# Patient Record
Sex: Female | Born: 1940
Health system: Southern US, Community
[De-identification: ages and names within clinical notes are randomized; demographics above are authoritative.]

## PROBLEM LIST (undated history)

## (undated) DIAGNOSIS — I441 Atrioventricular block, second degree: Secondary | ICD-10-CM

## (undated) DIAGNOSIS — I484 Atypical atrial flutter: Secondary | ICD-10-CM

## (undated) DIAGNOSIS — I10 Essential (primary) hypertension: Secondary | ICD-10-CM

## (undated) DIAGNOSIS — Z953 Presence of xenogenic heart valve: Secondary | ICD-10-CM

## (undated) DIAGNOSIS — Z9889 Other specified postprocedural states: Secondary | ICD-10-CM

## (undated) DIAGNOSIS — S065XAA Traumatic subdural hemorrhage with loss of consciousness status unknown, initial encounter: Secondary | ICD-10-CM

## (undated) DIAGNOSIS — Z889 Allergy status to unspecified drugs, medicaments and biological substances status: Secondary | ICD-10-CM

## (undated) DIAGNOSIS — I4891 Unspecified atrial fibrillation: Secondary | ICD-10-CM

## (undated) DIAGNOSIS — S069XAA Unspecified intracranial injury with loss of consciousness status unknown, initial encounter: Secondary | ICD-10-CM

## (undated) DIAGNOSIS — Z8679 Personal history of other diseases of the circulatory system: Secondary | ICD-10-CM

## (undated) DIAGNOSIS — M8580 Other specified disorders of bone density and structure, unspecified site: Secondary | ICD-10-CM

## (undated) DIAGNOSIS — S069X9A Unspecified intracranial injury with loss of consciousness of unspecified duration, initial encounter: Secondary | ICD-10-CM

## (undated) DIAGNOSIS — S065X9A Traumatic subdural hemorrhage with loss of consciousness of unspecified duration, initial encounter: Secondary | ICD-10-CM

## (undated) DIAGNOSIS — E039 Hypothyroidism, unspecified: Secondary | ICD-10-CM

## (undated) DIAGNOSIS — I77810 Thoracic aortic ectasia: Secondary | ICD-10-CM

## (undated) DIAGNOSIS — I052 Rheumatic mitral stenosis with insufficiency: Secondary | ICD-10-CM

## (undated) DIAGNOSIS — I071 Rheumatic tricuspid insufficiency: Secondary | ICD-10-CM

## (undated) DIAGNOSIS — I4819 Other persistent atrial fibrillation: Secondary | ICD-10-CM

## (undated) DIAGNOSIS — I700 Atherosclerosis of aorta: Secondary | ICD-10-CM

## (undated) DIAGNOSIS — I091 Rheumatic diseases of endocardium, valve unspecified: Secondary | ICD-10-CM

## (undated) DIAGNOSIS — K635 Polyp of colon: Secondary | ICD-10-CM

## (undated) DIAGNOSIS — I351 Nonrheumatic aortic (valve) insufficiency: Secondary | ICD-10-CM

## (undated) DIAGNOSIS — I2510 Atherosclerotic heart disease of native coronary artery without angina pectoris: Secondary | ICD-10-CM

## (undated) HISTORY — DX: Atherosclerosis of aorta: I70.0

## (undated) HISTORY — DX: Atypical atrial flutter: I48.4

## (undated) HISTORY — DX: Essential (primary) hypertension: I10

## (undated) HISTORY — DX: Traumatic subdural hemorrhage with loss of consciousness of unspecified duration, initial encounter: S06.5X9A

## (undated) HISTORY — DX: Atrioventricular block, second degree: I44.1

## (undated) HISTORY — DX: Unspecified intracranial injury with loss of consciousness of unspecified duration, initial encounter: S06.9X9A

## (undated) HISTORY — DX: Other specified disorders of bone density and structure, unspecified site: M85.80

## (undated) HISTORY — PX: TUBAL LIGATION: SHX77

## (undated) HISTORY — DX: Unspecified atrial fibrillation: I48.91

## (undated) HISTORY — DX: Rheumatic diseases of endocardium, valve unspecified: I09.1

## (undated) HISTORY — DX: Traumatic subdural hemorrhage with loss of consciousness status unknown, initial encounter: S06.5XAA

## (undated) HISTORY — PX: APPENDECTOMY: SHX54

## (undated) HISTORY — DX: Unspecified intracranial injury with loss of consciousness status unknown, initial encounter: S06.9XAA

## (undated) HISTORY — DX: Atherosclerotic heart disease of native coronary artery without angina pectoris: I25.10

---
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
## 1998-03-18 ENCOUNTER — Other Ambulatory Visit: Admission: RE | Admit: 1998-03-18 | Discharge: 1998-03-18 | Payer: Self-pay | Admitting: *Deleted

## 1999-03-16 ENCOUNTER — Other Ambulatory Visit: Admission: RE | Admit: 1999-03-16 | Discharge: 1999-03-16 | Payer: Self-pay | Admitting: *Deleted

## 1999-05-16 ENCOUNTER — Encounter: Payer: Self-pay | Admitting: *Deleted

## 1999-05-16 ENCOUNTER — Encounter: Admission: RE | Admit: 1999-05-16 | Discharge: 1999-05-16 | Payer: Self-pay | Admitting: *Deleted

## 2000-03-29 ENCOUNTER — Other Ambulatory Visit: Admission: RE | Admit: 2000-03-29 | Discharge: 2000-03-29 | Payer: Self-pay | Admitting: *Deleted

## 2000-05-21 ENCOUNTER — Encounter: Admission: RE | Admit: 2000-05-21 | Discharge: 2000-05-21 | Payer: Self-pay | Admitting: *Deleted

## 2000-05-21 ENCOUNTER — Encounter: Payer: Self-pay | Admitting: *Deleted

## 2001-04-16 ENCOUNTER — Other Ambulatory Visit: Admission: RE | Admit: 2001-04-16 | Discharge: 2001-04-16 | Payer: Self-pay | Admitting: *Deleted

## 2001-06-03 ENCOUNTER — Encounter: Admission: RE | Admit: 2001-06-03 | Discharge: 2001-06-03 | Payer: Self-pay | Admitting: *Deleted

## 2001-06-03 ENCOUNTER — Encounter: Payer: Self-pay | Admitting: *Deleted

## 2004-04-10 DIAGNOSIS — M8580 Other specified disorders of bone density and structure, unspecified site: Secondary | ICD-10-CM

## 2004-04-10 HISTORY — DX: Other specified disorders of bone density and structure, unspecified site: M85.80

## 2005-02-17 ENCOUNTER — Ambulatory Visit (HOSPITAL_COMMUNITY): Admission: RE | Admit: 2005-02-17 | Discharge: 2005-02-17 | Payer: Self-pay | Admitting: Family Medicine

## 2005-07-04 ENCOUNTER — Encounter (INDEPENDENT_AMBULATORY_CARE_PROVIDER_SITE_OTHER): Payer: Self-pay | Admitting: Specialist

## 2005-07-04 ENCOUNTER — Ambulatory Visit: Payer: Self-pay | Admitting: Internal Medicine

## 2005-07-04 ENCOUNTER — Ambulatory Visit (HOSPITAL_COMMUNITY): Admission: RE | Admit: 2005-07-04 | Discharge: 2005-07-04 | Payer: Self-pay | Admitting: Internal Medicine

## 2007-01-31 ENCOUNTER — Ambulatory Visit (HOSPITAL_COMMUNITY): Admission: RE | Admit: 2007-01-31 | Discharge: 2007-01-31 | Payer: Self-pay | Admitting: Family Medicine

## 2007-04-08 ENCOUNTER — Ambulatory Visit (HOSPITAL_COMMUNITY): Admission: RE | Admit: 2007-04-08 | Discharge: 2007-04-08 | Payer: Self-pay | Admitting: Family Medicine

## 2007-06-25 ENCOUNTER — Emergency Department (HOSPITAL_COMMUNITY): Admission: EM | Admit: 2007-06-25 | Discharge: 2007-06-25 | Payer: Self-pay | Admitting: Emergency Medicine

## 2008-02-04 ENCOUNTER — Ambulatory Visit (HOSPITAL_COMMUNITY): Admission: RE | Admit: 2008-02-04 | Discharge: 2008-02-04 | Payer: Self-pay | Admitting: Family Medicine

## 2009-04-27 ENCOUNTER — Ambulatory Visit (HOSPITAL_COMMUNITY): Admission: RE | Admit: 2009-04-27 | Discharge: 2009-04-27 | Payer: Self-pay | Admitting: Family Medicine

## 2010-01-31 ENCOUNTER — Ambulatory Visit (HOSPITAL_COMMUNITY): Admission: RE | Admit: 2010-01-31 | Discharge: 2010-01-31 | Payer: Self-pay | Admitting: Family Medicine

## 2010-05-01 ENCOUNTER — Encounter: Payer: Self-pay | Admitting: Family Medicine

## 2010-08-26 NOTE — Op Note (Signed)
NAME:  Hannah Roy, Hannah Roy                   ACCOUNT NO.:  [id number]   MEDICAL RECORD NO.:  [id number]          PATIENT TYPE:  AMB   LOCATION:  DAY                           FACILITY:  [facility]   PHYSICIAN:  Lionel December, M.D.    DATE OF BIRTH:  Nov 28, 1940   DATE OF PROCEDURE:  07/04/2005  DATE OF DISCHARGE:                                 OPERATIVE REPORT   PROCEDURE:  Colonoscopy with polypectomy.   INDICATIONS:  Peg is a 70 year old female who is undergoing screening  colonoscopy.  Family history is negative for colorectal carcinoma, although  brother died of some type of malignancy.  Procedure and risks were reviewed  with the patient and informed consent was obtained.   MEDICINES FOR CONSCIOUS SEDATION:  Demerol 50 mg IV, Versed 7 mg IV.   FINDINGS:  Procedure performed in endoscopy suite.  The patient's vital  signs and O2 saturations were monitored during procedure and remained  stable.  The patient was placed in the left lateral position and rectal  examination performed.  No abnormality noted on external or digital exam.  Olympus videoscope was placed in the rectum and advanced under vision into  sigmoid colon and beyond.  Preparation was excellent.  Scope was passed into  the cecum which was identified by ileocecal valve and appendiceal orifice.  Pictures taken for the record.  As the scope was withdrawn colonic mucosa  was carefully examined.  There was an 8-to-9-mm polyp which was bi-or-tri-  lobed at midsigmoid colon.  This was raised with submucosal injection of  saline and snared.  Polypectomy was complete.  This polyp was suctioned out  through the channel.  Mucosa and rest of the sigmoid colon and rectum was  normal.  Scope was retroflexed to examine anorectal junction and small  hemorrhoids were noted below the dentate line.  Endoscope was straightened  and withdrawn. The patient tolerated the procedure well.   FINAL DIAGNOSIS:  1.  An 8-to-9-mm polyp snared from the sigmoid  colon as described above.  2.  Small external hemorrhoids.   RECOMMENDATIONS:  1.  Stent instructions given.  2.  No aspirin for 1 week.  3.  Since this polyp appears to be edematous she will need to return for      followup exam in 5 years, but will wait for histology before further      recommendations are made.      Lionel December, M.D.  Electronically Signed     NR/MEDQ  D:  07/04/2005  T:  07/04/2005  Job:  [id number]   cc:   Lorin Picket A. Gerda Diss, MD  Fax: [phone]

## 2010-11-10 ENCOUNTER — Emergency Department (HOSPITAL_COMMUNITY): Payer: BC Managed Care – PPO

## 2010-11-10 ENCOUNTER — Encounter: Payer: Self-pay | Admitting: *Deleted

## 2010-11-10 ENCOUNTER — Emergency Department (HOSPITAL_COMMUNITY)
Admission: EM | Admit: 2010-11-10 | Discharge: 2010-11-10 | Disposition: A | Discharge status: Home / Self Care | Payer: BC Managed Care – PPO | Attending: Emergency Medicine | Admitting: Emergency Medicine

## 2010-11-10 ENCOUNTER — Other Ambulatory Visit: Payer: Self-pay

## 2010-11-10 DIAGNOSIS — H8110 Benign paroxysmal vertigo, unspecified ear: Secondary | ICD-10-CM | POA: Insufficient documentation

## 2010-11-10 DIAGNOSIS — I10 Essential (primary) hypertension: Secondary | ICD-10-CM | POA: Insufficient documentation

## 2010-11-10 HISTORY — DX: Essential (primary) hypertension: I10

## 2010-11-10 LAB — CBC
HCT: 38.3 % (ref 36.0–46.0)
Hemoglobin: 12.8 g/dL (ref 12.0–15.0)
MCH: 30.8 pg (ref 26.0–34.0)
MCHC: 33.4 g/dL (ref 30.0–36.0)
MCV: 92.3 fL (ref 78.0–100.0)
Platelets: 232 10*3/uL (ref 150–400)
RBC: 4.15 MIL/uL (ref 3.87–5.11)
RDW: 12.5 % (ref 11.5–15.5)
WBC: 5.6 10*3/uL (ref 4.0–10.5)

## 2010-11-10 LAB — BASIC METABOLIC PANEL
BUN: 21 mg/dL (ref 6–23)
CO2: 22 mEq/L (ref 19–32)
Calcium: 10.4 mg/dL (ref 8.4–10.5)
Chloride: 100 mEq/L (ref 96–112)
Creatinine, Ser: 0.6 mg/dL (ref 0.50–1.10)
GFR calc Af Amer: >60 mL/min (ref >60)
GFR calc non Af Amer: >60 mL/min (ref >60)
Glucose, Bld: 97 mg/dL (ref 70–99)
Potassium: 3.8 mEq/L (ref 3.5–5.1)
Sodium: 137 mEq/L (ref 135–145)

## 2010-11-10 LAB — DIFFERENTIAL
Basophils Absolute: 0 10*3/uL (ref 0.0–0.1)
Basophils Relative: 0 % (ref 0–1)
Eosinophils Absolute: 0.2 10*3/uL (ref 0.0–0.7)
Eosinophils Relative: 3 % (ref 0–5)
Lymphocytes Relative: 21 % (ref 12–46)
Lymphs Abs: 1.2 10*3/uL (ref 0.7–4.0)
Monocytes Absolute: 0.3 10*3/uL (ref 0.1–1.0)
Monocytes Relative: 5 % (ref 3–12)
Neutro Abs: 3.9 10*3/uL (ref 1.7–7.7)
Neutrophils Relative %: 70 % (ref 43–77)

## 2010-11-10 LAB — URINALYSIS, ROUTINE W REFLEX MICROSCOPIC
Bilirubin Urine: NEGATIVE
Glucose, UA: NEGATIVE mg/dL
Hgb urine dipstick: NEGATIVE
Ketones, ur: NEGATIVE mg/dL
Leukocytes, UA: NEGATIVE
Nitrite: NEGATIVE
Protein, ur: NEGATIVE mg/dL
Specific Gravity, Urine: 1.01 (ref 1.005–1.030)
Urobilinogen, UA: 0.2 mg/dL (ref 0.0–1.0)
pH: 5.5 (ref 5.0–8.0)

## 2010-11-10 MED ORDER — MECLIZINE HCL 25 MG PO TABS: 25.0000 mg | ORAL_TABLET | Freq: Three times a day (TID) | ORAL | Status: AC | PRN

## 2010-11-10 NOTE — ED Provider Notes (Signed)
History     CSN: [id number] Arrival date & time: 11/10/2010  7:07 PM  Chief Complaint  Patient presents with  . Dizziness   Patient is a 70 y.o. female presenting with extremity weakness. The history is provided by the patient.  Extremity Weakness This is a new problem. The current episode started today (She was just starting her shift at work,  stood up after sitting for about 10 minutes  and felt lightheaded and dizzy,  reporting blurred vision as well.  Her co workers helped her sit down, and her symptoms slowly improved over the next 15 minutes.  ). The problem has been rapidly improving. Associated symptoms include weakness. Pertinent negatives include no abdominal pain, arthralgias, chest pain, congestion, coughing, diaphoresis, fever, headaches, joint swelling, nausea, neck pain, numbness, rash or sore throat. Exacerbated by: movement. She has tried rest for the symptoms. The treatment provided moderate relief.    Past Medical History  Diagnosis Date  . Hypertension     History reviewed. No pertinent past surgical history.  No family history on file.  History  Substance Use Topics  . Smoking status: Never Smoker   . Smokeless tobacco: Not on file  . Alcohol Use: No    OB History    Grav Para Term Preterm Abortions TAB SAB Ect Mult Living                  Review of Systems  Constitutional: Negative for fever and diaphoresis.  HENT: Negative for ear pain, congestion, sore throat, neck pain, neck stiffness, sinus pressure and tinnitus.   Eyes: Positive for visual disturbance.  Respiratory: Negative for cough, chest tightness and shortness of breath.   Cardiovascular: Negative for chest pain and palpitations.  Gastrointestinal: Negative for nausea and abdominal pain.  Genitourinary: Negative.   Musculoskeletal: Positive for extremity weakness. Negative for joint swelling and arthralgias.  Skin: Negative.  Negative for rash and wound.  Neurological: Positive for  dizziness and weakness. Negative for syncope, speech difficulty, light-headedness, numbness and headaches.  Hematological: Negative.   Psychiatric/Behavioral: Negative.     Physical Exam  BP 148/90  Pulse 63  Temp(Src) 98.4 F (36.9 C) (Oral)  Resp 20  Ht 5\' 1"  (1.549 m)  Wt 130 lb (58.968 kg)  BMI 24.56 kg/m2  SpO2 96%  Physical Exam  Vitals reviewed. Constitutional: She is oriented to person, place, and time. She appears well-developed and well-nourished.  HENT:  Head: Normocephalic and atraumatic.  Mouth/Throat: Oropharynx is clear and moist.  Eyes: Conjunctivae and EOM are normal. Pupils are equal, round, and reactive to light.  Neck: Normal range of motion. Neck supple.  Cardiovascular: Normal rate, regular rhythm, normal heart sounds and intact distal pulses.   Pulmonary/Chest: Effort normal and breath sounds normal. She has no wheezes.  Abdominal: Soft. Bowel sounds are normal. There is no tenderness.  Musculoskeletal: Normal range of motion. She exhibits no edema and no tenderness.  Lymphadenopathy:    She has no cervical adenopathy.  Neurological: She is alert and oriented to person, place, and time. She has normal strength. No cranial nerve deficit or sensory deficit. She exhibits normal muscle tone. Coordination normal. GCS eye subscore is 4. GCS verbal subscore is 5. GCS motor subscore is 6.  Skin: Skin is warm and dry.  Psychiatric: She has a normal mood and affect.    ED Course  Procedures  MDM      Date: 11/10/2010  Rate: 63  Rhythm: normal sinus rhythm  QRS  Axis: normal  Intervals: normal  ST/T Wave abnormalities: normal  Conduction Disutrbances:none  Narrative Interpretation:   Old EKG Reviewed: unchanged    Candis Musa, PA 11/10/10 2107

## 2010-11-10 NOTE — ED Notes (Signed)
Pt reports she was at work tonight and suddenly became dizzy, pt reports increased dizziness and "spinning" sensation when moving head and changing positions

## 2010-11-10 NOTE — ED Notes (Signed)
Pt reports sudden onset of "world spinning" but no focal weakness Denies cp/sob/syncope Well appearing at this time, no focal neuro deficits  Medical screening examination/treatment/procedure(s) were conducted as a shared visit with non-physician practitioner(s) and myself.  I personally evaluated the patient during the encounter  Joya Gaskins, MD 11/10/10 2010

## 2010-11-10 NOTE — ED Notes (Signed)
Pt able to ambulate w/ no gait issues. Pt states she feel 100% better now than what she did before coming to the ed. Reports she still has a little dizziness.

## 2010-11-11 NOTE — ED Provider Notes (Signed)
Medical screening examination/treatment/procedure(s) were conducted as a shared visit with non-physician practitioner(s) and myself.  I personally evaluated the patient during the encounter  Pt improved, walking around room, no pastpointing, no focal weakness.  Suspicion for CVA is low Suspicion for pre-syncope is low.  She is well appearing and wants to go home  Joya Gaskins, MD 11/11/10 0000

## 2011-01-02 LAB — DIFFERENTIAL
Basophils Absolute: 0
Basophils Relative: 1
Eosinophils Absolute: 0.3
Eosinophils Relative: 5
Lymphocytes Relative: 36
Lymphs Abs: 1.7
Monocytes Absolute: 0.4
Monocytes Relative: 8
Neutro Abs: 2.3
Neutrophils Relative %: 49

## 2011-01-02 LAB — BASIC METABOLIC PANEL
BUN: 15
CO2: 27
Calcium: 9.1
Chloride: 107
Creatinine, Ser: 1.07
GFR calc Af Amer: >60
GFR calc non Af Amer: 52 — ABNORMAL LOW
Glucose, Bld: 103 — ABNORMAL HIGH
Potassium: 3.8
Sodium: 140

## 2011-01-02 LAB — CBC
HCT: 36.8
Hemoglobin: 12.9
MCHC: 35.1
MCV: 91.5
Platelets: 221
RBC: 4.02
RDW: 12.7
WBC: 4.8

## 2011-01-02 LAB — POCT CARDIAC MARKERS
CKMB, poc: 1.1
Myoglobin, poc: 41.2
Operator id: 216151
Troponin i, poc: <0.05

## 2011-01-10 ENCOUNTER — Other Ambulatory Visit: Payer: Self-pay | Admitting: Family Medicine

## 2011-01-10 DIAGNOSIS — Z139 Encounter for screening, unspecified: Secondary | ICD-10-CM

## 2011-01-11 ENCOUNTER — Other Ambulatory Visit (INDEPENDENT_AMBULATORY_CARE_PROVIDER_SITE_OTHER): Payer: Self-pay | Admitting: *Deleted

## 2011-01-11 ENCOUNTER — Encounter (INDEPENDENT_AMBULATORY_CARE_PROVIDER_SITE_OTHER): Payer: Self-pay | Admitting: *Deleted

## 2011-01-11 DIAGNOSIS — Z86010 Personal history of colonic polyps: Secondary | ICD-10-CM

## 2011-01-11 DIAGNOSIS — Z800 Family history of malignant neoplasm of digestive organs: Secondary | ICD-10-CM

## 2011-02-02 ENCOUNTER — Ambulatory Visit (HOSPITAL_COMMUNITY): Payer: BC Managed Care – PPO

## 2011-02-21 ENCOUNTER — Ambulatory Visit (HOSPITAL_COMMUNITY)
Admission: RE | Admit: 2011-02-21 | Discharge: 2011-02-21 | Disposition: A | Discharge status: Home / Self Care | Payer: BC Managed Care – PPO | Source: Ambulatory Visit | Attending: Family Medicine | Admitting: Family Medicine

## 2011-02-21 DIAGNOSIS — Z1231 Encounter for screening mammogram for malignant neoplasm of breast: Secondary | ICD-10-CM | POA: Insufficient documentation

## 2011-02-21 DIAGNOSIS — Z139 Encounter for screening, unspecified: Secondary | ICD-10-CM

## 2011-03-09 ENCOUNTER — Telehealth (INDEPENDENT_AMBULATORY_CARE_PROVIDER_SITE_OTHER): Payer: Self-pay | Admitting: *Deleted

## 2011-03-09 NOTE — Telephone Encounter (Signed)
PCP/Requesting MD:  Lilyan Punt  Name: Hannah Roy  DOB: 15-Mar-2041  Home Phone: [phone]      Procedure: TCS  Reason/Indication:  SURVEILLANCE, HX POLYPS, + FH CRC  Has patient had this procedure before?  YES  If so, when, by whom and where?  2007  Is there a family history of colon cancer?  YES  Who?  What age when diagnosed?  FATHER, DAUGHTER  Is patient diabetic?   NO      Does patient have prosthetic heart valve?  NO  Do you have a pacemaker?  NO  Has patient had joint replacement within last 12 months?  NO  Is patient on Coumadin, Plavix and/or Aspirin? NO  Medications: OTC ALLERGY MEDICINE, VERAPAMIL 180 MG DAILY  Allergies: NKDA  Pharmacy: [facility]  Medication Adjustment: NONE  Procedure date & time: 03/23/11 @ 1:00

## 2011-03-13 NOTE — Telephone Encounter (Signed)
agree

## 2011-03-22 ENCOUNTER — Encounter (HOSPITAL_COMMUNITY): Payer: Self-pay | Admitting: Pharmacy Technician

## 2011-03-23 ENCOUNTER — Encounter (HOSPITAL_COMMUNITY)
Admission: RE | Disposition: A | Discharge status: Home / Self Care | Payer: Self-pay | Source: Ambulatory Visit | Attending: Internal Medicine

## 2011-03-23 ENCOUNTER — Other Ambulatory Visit (INDEPENDENT_AMBULATORY_CARE_PROVIDER_SITE_OTHER): Payer: Self-pay | Admitting: Internal Medicine

## 2011-03-23 ENCOUNTER — Encounter (HOSPITAL_COMMUNITY): Payer: Self-pay | Admitting: *Deleted

## 2011-03-23 ENCOUNTER — Ambulatory Visit (HOSPITAL_COMMUNITY)
Admission: RE | Admit: 2011-03-23 | Discharge: 2011-03-23 | Disposition: A | Discharge status: Home / Self Care | Payer: BC Managed Care – PPO | Source: Ambulatory Visit | Attending: Internal Medicine | Admitting: Internal Medicine

## 2011-03-23 DIAGNOSIS — Z79899 Other long term (current) drug therapy: Secondary | ICD-10-CM | POA: Insufficient documentation

## 2011-03-23 DIAGNOSIS — I10 Essential (primary) hypertension: Secondary | ICD-10-CM | POA: Insufficient documentation

## 2011-03-23 DIAGNOSIS — Z860100 Personal history of colon polyps, unspecified: Secondary | ICD-10-CM | POA: Insufficient documentation

## 2011-03-23 DIAGNOSIS — D126 Benign neoplasm of colon, unspecified: Secondary | ICD-10-CM

## 2011-03-23 DIAGNOSIS — Z1211 Encounter for screening for malignant neoplasm of colon: Secondary | ICD-10-CM

## 2011-03-23 DIAGNOSIS — K644 Residual hemorrhoidal skin tags: Secondary | ICD-10-CM | POA: Insufficient documentation

## 2011-03-23 DIAGNOSIS — Z09 Encounter for follow-up examination after completed treatment for conditions other than malignant neoplasm: Secondary | ICD-10-CM

## 2011-03-23 DIAGNOSIS — Z800 Family history of malignant neoplasm of digestive organs: Secondary | ICD-10-CM | POA: Insufficient documentation

## 2011-03-23 HISTORY — PX: COLONOSCOPY: SHX5424

## 2011-03-23 HISTORY — DX: Allergy status to unspecified drugs, medicaments and biological substances: Z88.9

## 2011-03-23 HISTORY — DX: Polyp of colon: K63.5

## 2011-03-23 SURGERY — COLONOSCOPY
Anesthesia: Moderate Sedation

## 2011-03-23 MED ORDER — MEPERIDINE HCL 50 MG/ML IJ SOLN
INTRAMUSCULAR | Status: AC
  Filled 2011-03-23: qty 1

## 2011-03-23 MED ORDER — SODIUM CHLORIDE 0.45 % IV SOLN
Freq: Once | INTRAVENOUS | Status: AC
  Administered 2011-03-23: 12:00:00 via INTRAVENOUS

## 2011-03-23 MED ORDER — SIMETHICONE 40 MG/0.6ML PO SUSP
ORAL | Status: DC | PRN
  Administered 2011-03-23: 12:00:00

## 2011-03-23 MED ORDER — MIDAZOLAM HCL 5 MG/5ML IJ SOLN
INTRAMUSCULAR | Status: AC
  Filled 2011-03-23: qty 10

## 2011-03-23 MED ORDER — MIDAZOLAM HCL 5 MG/5ML IJ SOLN
INTRAMUSCULAR | Status: DC | PRN
  Administered 2011-03-23 (×2): 2 mg via INTRAVENOUS
  Administered 2011-03-23: 1 mg via INTRAVENOUS

## 2011-03-23 MED ORDER — MEPERIDINE HCL 50 MG/ML IJ SOLN
INTRAMUSCULAR | Status: DC | PRN
  Administered 2011-03-23 (×2): 25 mg via INTRAVENOUS

## 2011-03-23 NOTE — Op Note (Signed)
COLONOSCOPY PROCEDURE REPORT  PATIENT:  Hannah Roy  MR#:  [id number] Birthdate:  February 07, 1941, 70 y.o., female Endoscopist:  Dr. Malissa Hippo, MD Referred By:  Dr. Lorin Picket looking, MD  Procedure Date: 03/23/2011  Procedure:   Colonoscopy  Indications: Patient is 70 year old female was there for surveillance colonoscopy. 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 She is presently free of GI symptoms. Family history significant for the fact her daughter had surgery for colon carcinoma at age 55 and is doing fine a year later.  Informed Consent:  Procedure and risks were reviewed with the patient and informed consent was obtained.  Medications:  Demerol 50 mg IV Versed 5 mg IV  Description of procedure:  After a digital rectal exam was performed, that colonoscope was advanced from the anus through the rectum and colon to the area of the cecum, ileocecal valve and appendiceal orifice. The cecum was deeply intubated. These structures were well-seen and photographed for the record. From the level of the cecum and ileocecal valve, the scope was slowly and cautiously withdrawn. The mucosal surfaces were carefully surveyed utilizing scope tip to flexion to facilitate fold flattening as needed. The scope was pulled down into the rectum where a thorough exam including retroflexion was performed.  Findings:   Prep excellent. 2 small polyps ablated via cold biopsy from hepatic flexure and submitted in one container. 2 small polyps in the region of distal transverse colon ablated via cold biopsy and submitted in one container. Mild diffuse mucosal pigmentation consistent with melanosis coli. Small hemorrhoids below the dentate line.  Therapeutic/Diagnostic Maneuvers Performed:  See above  Complications:  None  Cecal Withdrawal Time:  20 minutes  Impression:  Examination performed to cecum. Two small polyps ablated via cold biopsy from  hepatic flexure and submitted in one container. Two  small polyps ablated via cold biopsy from distal transverse colon and submitted in one container. Mild changes of melanosis coli. Mall external hemorrhoids.  Recommendations:  Standard instructions given. I will be contacting patient with results of biopsy and further recommendations.  REHMAN,NAJEEB U  03/23/2011 1:57 PM  CC: Dr. Lilyan Punt, MD, MD & Dr. Bonnetta Barry ref. provider found

## 2011-03-23 NOTE — H&P (Addendum)
Hannah Roy is an 70 y.o. female.   Chief Complaint: Patient is here for colonoscopy. HPI: Patient is 70 year old female were tubulovillous adenoma with high-grade dysplasia removed in March 2007. She does not have any GI symptoms. Family history is positive for colon carcinoma in her daughter with surgery at age 52 and is doing fine a year later.  Past Medical History  Diagnosis Date  . Hypertension   . History of seasonal allergies   . Colon polyps     Past Surgical History  Procedure Date  . Appendectomy   . Tubal ligation     Family History  Problem Relation Age of Onset  . Colon cancer Daughter    Social History:  reports that she has quit smoking. She does not have any smokeless tobacco history on file. She reports that she does not drink alcohol or use illicit drugs.  Allergies:  Allergies  Allergen Reactions  . Bee Venom Anaphylaxis  . Latex Rash    Medications Prior to Admission  Medication Dose Route Frequency Provider Last Rate Last Dose  . 0.45 % sodium chloride infusion   Intravenous Once Malissa Hippo, MD 20 mL/hr at 03/23/11 1229    . meperidine (DEMEROL) 50 MG/ML injection           . midazolam (VERSED) 5 MG/5ML injection           . simethicone susp in sterile water 1000 mL irrigation    PRN Malissa Hippo, MD       Medications Prior to Admission  Medication Sig Dispense Refill  . verapamil (CALAN-SR) 180 MG CR tablet Take 180 mg by mouth at bedtime.          No results found for this or any previous visit (from the past 48 hour(s)). No results found.  Review of Systems  Constitutional: Negative for weight loss.  Gastrointestinal: Negative for abdominal pain, diarrhea, constipation, blood in stool and melena.    Blood pressure 147/75, pulse 62, temperature 97.7 F (36.5 C), temperature source Oral, resp. rate 20, height 5\' 1"  (1.549 m), weight 130 lb (58.968 kg), SpO2 98.00%. Physical Exam  Constitutional: She appears well-developed and  well-nourished.  HENT:  Mouth/Throat: Oropharynx is clear and moist.  Eyes: Conjunctivae are normal. No scleral icterus.  Neck: No thyromegaly present.  Cardiovascular: Normal rate, regular rhythm and normal heart sounds.   No murmur heard. Respiratory: Effort normal and breath sounds normal.  GI: Soft. She exhibits no distension and no mass. There is no tenderness.  Musculoskeletal: She exhibits no edema.  Lymphadenopathy:    She has no cervical adenopathy.  Neurological: She is alert.  Skin: Skin is warm and dry.     Assessment/Plan History of colonic adenoma. Family history of colon carcinoma  (daughter)  Hannah Roy U 03/23/2011, 1:01 PM

## 2011-04-07 ENCOUNTER — Encounter (INDEPENDENT_AMBULATORY_CARE_PROVIDER_SITE_OTHER): Payer: Self-pay | Admitting: *Deleted

## 2011-04-07 ENCOUNTER — Encounter (HOSPITAL_COMMUNITY): Payer: Self-pay | Admitting: Internal Medicine

## 2011-10-10 ENCOUNTER — Other Ambulatory Visit: Payer: Self-pay | Admitting: Family Medicine

## 2011-10-10 ENCOUNTER — Ambulatory Visit (HOSPITAL_COMMUNITY)
Admission: RE | Admit: 2011-10-10 | Discharge: 2011-10-10 | Disposition: A | Discharge status: Home / Self Care | Payer: BC Managed Care – PPO | Source: Ambulatory Visit | Attending: Family Medicine | Admitting: Family Medicine

## 2011-10-10 DIAGNOSIS — R079 Chest pain, unspecified: Secondary | ICD-10-CM

## 2011-10-10 DIAGNOSIS — I517 Cardiomegaly: Secondary | ICD-10-CM | POA: Insufficient documentation

## 2011-10-10 DIAGNOSIS — M25519 Pain in unspecified shoulder: Secondary | ICD-10-CM

## 2011-10-10 DIAGNOSIS — I10 Essential (primary) hypertension: Secondary | ICD-10-CM | POA: Insufficient documentation

## 2011-10-10 DIAGNOSIS — Z87891 Personal history of nicotine dependence: Secondary | ICD-10-CM | POA: Insufficient documentation

## 2012-02-12 ENCOUNTER — Other Ambulatory Visit: Payer: Self-pay | Admitting: Family Medicine

## 2012-02-12 DIAGNOSIS — Z139 Encounter for screening, unspecified: Secondary | ICD-10-CM

## 2012-02-23 ENCOUNTER — Ambulatory Visit (HOSPITAL_COMMUNITY)
Admission: RE | Admit: 2012-02-23 | Discharge: 2012-02-23 | Disposition: A | Discharge status: Home / Self Care | Payer: BC Managed Care – PPO | Source: Ambulatory Visit | Attending: Family Medicine | Admitting: Family Medicine

## 2012-02-23 DIAGNOSIS — Z1231 Encounter for screening mammogram for malignant neoplasm of breast: Secondary | ICD-10-CM | POA: Insufficient documentation

## 2012-02-23 DIAGNOSIS — Z139 Encounter for screening, unspecified: Secondary | ICD-10-CM

## 2012-03-26 ENCOUNTER — Other Ambulatory Visit: Payer: Self-pay | Admitting: Family Medicine

## 2012-03-26 DIAGNOSIS — Z139 Encounter for screening, unspecified: Secondary | ICD-10-CM

## 2012-03-29 ENCOUNTER — Ambulatory Visit (HOSPITAL_COMMUNITY)
Admission: RE | Admit: 2012-03-29 | Discharge: 2012-03-29 | Disposition: A | Discharge status: Home / Self Care | Payer: BC Managed Care – PPO | Source: Ambulatory Visit | Attending: Family Medicine | Admitting: Family Medicine

## 2012-03-29 DIAGNOSIS — Z139 Encounter for screening, unspecified: Secondary | ICD-10-CM

## 2012-03-29 DIAGNOSIS — M949 Disorder of cartilage, unspecified: Secondary | ICD-10-CM | POA: Insufficient documentation

## 2012-03-29 DIAGNOSIS — M899 Disorder of bone, unspecified: Secondary | ICD-10-CM | POA: Insufficient documentation

## 2012-07-26 ENCOUNTER — Encounter: Payer: Self-pay | Admitting: *Deleted

## 2012-07-30 ENCOUNTER — Ambulatory Visit (INDEPENDENT_AMBULATORY_CARE_PROVIDER_SITE_OTHER): Payer: BC Managed Care – PPO | Admitting: Family Medicine

## 2012-07-30 ENCOUNTER — Encounter: Payer: Self-pay | Admitting: Family Medicine

## 2012-07-30 VITALS — BP 130/78 | HR 80 | Wt 130.0 lb

## 2012-07-30 DIAGNOSIS — J309 Allergic rhinitis, unspecified: Secondary | ICD-10-CM

## 2012-07-30 DIAGNOSIS — I10 Essential (primary) hypertension: Secondary | ICD-10-CM | POA: Insufficient documentation

## 2012-07-30 DIAGNOSIS — M899 Disorder of bone, unspecified: Secondary | ICD-10-CM

## 2012-07-30 DIAGNOSIS — M19041 Primary osteoarthritis, right hand: Secondary | ICD-10-CM

## 2012-07-30 DIAGNOSIS — M19049 Primary osteoarthritis, unspecified hand: Secondary | ICD-10-CM

## 2012-07-30 DIAGNOSIS — M949 Disorder of cartilage, unspecified: Secondary | ICD-10-CM

## 2012-07-30 DIAGNOSIS — M8580 Other specified disorders of bone density and structure, unspecified site: Secondary | ICD-10-CM | POA: Insufficient documentation

## 2012-07-30 MED ORDER — VERAPAMIL HCL ER 180 MG PO CP24: 180.0000 mg | ORAL_CAPSULE | Freq: Every day | ORAL | Status: DC

## 2012-07-30 NOTE — Patient Instructions (Signed)
Stick with medications  Physical in later fall.  Enjoy [place]!!

## 2012-07-30 NOTE — Progress Notes (Signed)
  Subjective:    Patient ID: Hannah Roy, female    DOB: 22-Jun-1940, 72 y.o.   MRN: [id number]  Hypertension This is a chronic problem. The current episode started more than 1 year ago. The problem is unchanged. The problem is controlled. Pertinent negatives include no chest pain, headaches, orthopnea, palpitations or peripheral edema. Risk factors for coronary artery disease include post-menopausal state. Past treatments include calcium channel blockers. The current treatment provides significant improvement. There are no compliance problems.  There is no history of angina or kidney disease. There is no history of chronic renal disease.      Review of Systems  Constitutional: Negative for activity change and appetite change.  HENT: Positive for congestion and rhinorrhea.        Allergic rhinitis  Respiratory: Negative for apnea, choking and chest tightness.   Cardiovascular: Negative for chest pain, palpitations and orthopnea.  Neurological: Negative for headaches.       Objective:   Physical Exam  Vitals reviewed. Constitutional: She appears well-developed and well-nourished. No distress.  HENT:  Head: Normocephalic and atraumatic.  Right Ear: External ear normal.  Left Ear: External ear normal.  Neck: Normal range of motion. No thyromegaly present.  Cardiovascular: Normal rate, regular rhythm and normal heart sounds.   No murmur heard. Pulmonary/Chest: Effort normal and breath sounds normal.  Musculoskeletal: She exhibits no edema.  Lymphadenopathy:    She has no cervical adenopathy.   Patient is retiring which is a good thing for her she will be taking a trip in [place] then [place] to see her family Patient states she did have right hip pain for couple weeks but it went away.      Assessment & Plan:  Hypertension-very good control continue current medications followup for a physical in the fall time Osteopenia calcium and vitamin D as directed along with regular  exercise and healthy diet No need for lab work currently I would recommend lab work in the fall time. Mild posterior arthritis Tylenol as needed don't exceed 4 in a day followup if any problems

## 2012-08-12 ENCOUNTER — Other Ambulatory Visit: Payer: Self-pay | Admitting: Family Medicine

## 2012-08-27 ENCOUNTER — Other Ambulatory Visit: Payer: Self-pay | Admitting: *Deleted

## 2012-08-27 MED ORDER — VERAPAMIL HCL ER 180 MG PO TBCR: 180.0000 mg | EXTENDED_RELEASE_TABLET | Freq: Every day | ORAL | Status: DC

## 2012-09-04 ENCOUNTER — Telehealth: Payer: Self-pay | Admitting: Family Medicine

## 2012-09-04 ENCOUNTER — Other Ambulatory Visit: Payer: Self-pay | Admitting: Family Medicine

## 2012-09-04 NOTE — Telephone Encounter (Signed)
Call pharmacy refill times three.

## 2012-09-04 NOTE — Telephone Encounter (Signed)
Rx called into [facility]. Patient was notified.

## 2012-09-04 NOTE — Telephone Encounter (Signed)
Desoximetasone Cream [place], 0.25% refill please for her re occuring rash, [facility]

## 2013-03-12 DIAGNOSIS — Z23 Encounter for immunization: Secondary | ICD-10-CM | POA: Diagnosis not present

## 2013-04-04 ENCOUNTER — Other Ambulatory Visit: Payer: Self-pay | Admitting: *Deleted

## 2013-04-04 ENCOUNTER — Telehealth: Payer: Self-pay | Admitting: Family Medicine

## 2013-04-04 MED ORDER — VERAPAMIL HCL ER 180 MG PO TBCR: 180.0000 mg | EXTENDED_RELEASE_TABLET | Freq: Every day | ORAL | Status: DC

## 2013-04-04 NOTE — Telephone Encounter (Signed)
Script ready for pickup. Pt notified.  

## 2013-04-04 NOTE — Telephone Encounter (Signed)
Patient changed her mind and said that she would like to pick up paper Rx.

## 2013-04-04 NOTE — Telephone Encounter (Signed)
Patient needs Rx for verapamil ER tab 180 mg   [facility]

## 2013-04-15 ENCOUNTER — Encounter: Payer: Self-pay | Admitting: Family Medicine

## 2013-04-15 ENCOUNTER — Ambulatory Visit (INDEPENDENT_AMBULATORY_CARE_PROVIDER_SITE_OTHER): Payer: Medicare Other | Admitting: Family Medicine

## 2013-04-15 VITALS — BP 136/82 | Ht 61.0 in | Wt 138.0 lb

## 2013-04-15 DIAGNOSIS — H9319 Tinnitus, unspecified ear: Secondary | ICD-10-CM

## 2013-04-15 DIAGNOSIS — I10 Essential (primary) hypertension: Secondary | ICD-10-CM

## 2013-04-15 DIAGNOSIS — Z0000 Encounter for general adult medical examination without abnormal findings: Secondary | ICD-10-CM | POA: Diagnosis not present

## 2013-04-15 DIAGNOSIS — R5381 Other malaise: Secondary | ICD-10-CM | POA: Diagnosis not present

## 2013-04-15 DIAGNOSIS — H9312 Tinnitus, left ear: Secondary | ICD-10-CM

## 2013-04-15 DIAGNOSIS — R5383 Other fatigue: Secondary | ICD-10-CM | POA: Diagnosis not present

## 2013-04-15 DIAGNOSIS — Z006 Encounter for examination for normal comparison and control in clinical research program: Secondary | ICD-10-CM

## 2013-04-15 MED ORDER — VERAPAMIL HCL ER 180 MG PO TBCR: 180.0000 mg | EXTENDED_RELEASE_TABLET | Freq: Every day | ORAL | Status: DC

## 2013-04-15 NOTE — Progress Notes (Signed)
   Subjective:    Patient ID: Hannah Roy, female    DOB: 09/01/1940, 73 y.o.   MRN: [id number]  HPIBlood pressure check up. Needs a 30 day supply of blood pressure med until mail order comes in.   Left ear pain and ringing for about 10 days.  The patient comes in today for a wellness visit.  A review of their health history was completed.  A review of medications was also completed. Any necessary refills were discussed. Sensible healthy diet was discussed. Importance of minimizing excessive salt and carbohydrates was also discussed. Safety was stressed including driving, activities at work and at home where applicable. Importance of regular physical activity for overall health was discussed. Preventative measures appropriate for age were discussed. Time was spent with the patient discussing any concerns they have about their well-being.  AWV- Annual Wellness Visit  The patient was seen for their annual wellness visit. The patient's past medical history, surgical history, and family history were reviewed. Pertinent vaccines were reviewed ( tetanus, pneumonia, shingles, flu) The patient's medication list was reviewed and updated. The height and weight were entered. The patient's current BMI is:  Cognitive screening was completed. Outcome of Mini - Cog:  Falls within the past 6 months: Current tobacco usage: (All patients who use tobacco were given written and verbal information on quitting)  Recent listing of emergency department/hospitalizations over the past year were reviewed.  current specialist the patient sees on a regular basis: none  Medicare annual wellness visit patient questionnaire was reviewed.  A written screening schedule for the patient for the next 5-10 years was given. Appropriate discussion of followup regarding next visit was discussed.       Review of Systems  Constitutional: Negative for activity change, appetite change and fatigue.  Endocrine:  Negative for polydipsia and polyphagia.  Genitourinary: Negative for frequency.  Neurological: Negative for weakness.  Psychiatric/Behavioral: Negative for confusion.       Objective:   Physical Exam  Vitals reviewed. Constitutional: She appears well-nourished. No distress.  Cardiovascular: Normal rate, regular rhythm and normal heart sounds.   No murmur heard. Pulmonary/Chest: Effort normal and breath sounds normal. No respiratory distress.  Musculoskeletal: She exhibits no edema.  Lymphadenopathy:    She has no cervical adenopathy.  Neurological: She is alert. She exhibits normal muscle tone.  Psychiatric: Her behavior is normal.          Assessment & Plan:  Wellness-safety measures dietary measures all discussed her medicines were reviewed. Laboratory work as well. NEXT COLON 2017   tinitis- ringing/test hearing  HTN- do labs mamo pt will order

## 2013-04-16 ENCOUNTER — Encounter: Payer: Self-pay | Admitting: Family Medicine

## 2013-04-16 LAB — LIPID PANEL
Cholesterol: 180 mg/dL (ref 0–200)
HDL: 66 mg/dL (ref >39)
LDL Cholesterol: 101 mg/dL — ABNORMAL HIGH (ref 0–99)
Total CHOL/HDL Ratio: 2.7 Ratio
Triglycerides: 64 mg/dL (ref <150)
VLDL: 13 mg/dL (ref 0–40)

## 2013-04-16 LAB — TSH: TSH: 2.813 u[IU]/mL (ref 0.350–4.500)

## 2013-04-16 LAB — BASIC METABOLIC PANEL
BUN: 15 mg/dL (ref 6–23)
CO2: 28 mEq/L (ref 19–32)
Calcium: 9.1 mg/dL (ref 8.4–10.5)
Chloride: 101 mEq/L (ref 96–112)
Creat: 0.66 mg/dL (ref 0.50–1.10)
Glucose, Bld: 79 mg/dL (ref 70–99)
Potassium: 4.6 mEq/L (ref 3.5–5.3)
Sodium: 137 mEq/L (ref 135–145)

## 2013-07-31 ENCOUNTER — Other Ambulatory Visit: Payer: Self-pay | Admitting: *Deleted

## 2013-07-31 ENCOUNTER — Telehealth: Payer: Self-pay | Admitting: Family Medicine

## 2013-07-31 MED ORDER — VERAPAMIL HCL ER 180 MG PO TBCR: 180.0000 mg | EXTENDED_RELEASE_TABLET | Freq: Every day | ORAL | Status: DC

## 2013-07-31 NOTE — Telephone Encounter (Signed)
verapamil (CALAN-SR) 180 MG CR tablet  90 day refill to [facility]

## 2013-07-31 NOTE — Telephone Encounter (Deleted)
verapamil (CALAN-SR) 180 MG CR tablet  Pt states she needs a refill on this med to  [facility] and wants to know if it can be  90 day supply for

## 2013-07-31 NOTE — Telephone Encounter (Signed)
Med sent to [facility]. Pt notified.

## 2013-09-23 DIAGNOSIS — H2510 Age-related nuclear cataract, unspecified eye: Secondary | ICD-10-CM | POA: Diagnosis not present

## 2013-10-17 ENCOUNTER — Encounter: Payer: Self-pay | Admitting: Family Medicine

## 2013-10-17 ENCOUNTER — Ambulatory Visit (INDEPENDENT_AMBULATORY_CARE_PROVIDER_SITE_OTHER): Payer: Medicare Other | Admitting: Family Medicine

## 2013-10-17 VITALS — BP 120/84 | Ht 61.0 in | Wt 142.0 lb

## 2013-10-17 DIAGNOSIS — I10 Essential (primary) hypertension: Secondary | ICD-10-CM

## 2013-10-17 DIAGNOSIS — M1710 Unilateral primary osteoarthritis, unspecified knee: Secondary | ICD-10-CM | POA: Diagnosis not present

## 2013-10-17 DIAGNOSIS — J301 Allergic rhinitis due to pollen: Secondary | ICD-10-CM | POA: Diagnosis not present

## 2013-10-17 DIAGNOSIS — M1711 Unilateral primary osteoarthritis, right knee: Secondary | ICD-10-CM

## 2013-10-17 MED ORDER — VERAPAMIL HCL ER 180 MG PO TBCR: 180.0000 mg | EXTENDED_RELEASE_TABLET | Freq: Every day | ORAL | Status: DC

## 2013-10-17 MED ORDER — FLUTICASONE PROPIONATE 50 MCG/ACT NA SUSP: 2.0000 | Freq: Every day | NASAL | Status: DC | PRN

## 2013-10-17 NOTE — Progress Notes (Signed)
   Subjective:    Patient ID: Hannah Roy, female    DOB: 12-26-1940, 73 y.o.   MRN: [id number]  Hypertension This is a chronic problem. The current episode started more than 1 year ago. The problem has been gradually improving since onset. The problem is controlled. Pertinent negatives include no chest pain or shortness of breath. There are no associated agents to hypertension. There are no known risk factors for coronary artery disease. Treatments tried: Verapamil. The current treatment provides significant improvement. There are no compliance problems.   Patient needs a refill on her flonase and verapamil.  Patient states that her right knee has been hurting for several weeks. Treatments tried: Tylenol. Mild relief.     Review of Systems  Constitutional: Negative for activity change, appetite change and fatigue.  HENT: Negative for congestion.   Respiratory: Negative for shortness of breath.   Cardiovascular: Negative for chest pain.  Gastrointestinal: Negative for abdominal pain.  Endocrine: Negative for polydipsia and polyphagia.  Genitourinary: Negative for frequency.  Neurological: Negative for weakness.  Psychiatric/Behavioral: Negative for confusion.       Objective:   Physical Exam  Vitals reviewed. Constitutional: She appears well-nourished. No distress.  Cardiovascular: Normal rate, regular rhythm and normal heart sounds.   No murmur heard. Pulmonary/Chest: Effort normal and breath sounds normal. No respiratory distress.  Musculoskeletal: She exhibits no edema.  Lymphadenopathy:    She has no cervical adenopathy.  Neurological: She is alert. She exhibits normal muscle tone.  Psychiatric: Her behavior is normal.          Assessment & Plan:  HTN good control dietary measures discussed physical activity discussed. Also patient doing good job taking medication. Continue this blood pressure good  Mild osteoarthritis right knee Tylenol when necessary followup if  ongoing troubles

## 2013-12-17 ENCOUNTER — Other Ambulatory Visit: Payer: Self-pay | Admitting: Family Medicine

## 2013-12-17 DIAGNOSIS — Z1231 Encounter for screening mammogram for malignant neoplasm of breast: Secondary | ICD-10-CM

## 2014-01-07 DIAGNOSIS — Z23 Encounter for immunization: Secondary | ICD-10-CM | POA: Diagnosis not present

## 2014-01-14 ENCOUNTER — Ambulatory Visit (HOSPITAL_COMMUNITY)
Admission: RE | Admit: 2014-01-14 | Discharge: 2014-01-14 | Disposition: A | Discharge status: Home / Self Care | Payer: Medicare Other | Source: Ambulatory Visit | Attending: Family Medicine | Admitting: Family Medicine

## 2014-01-14 DIAGNOSIS — Z1231 Encounter for screening mammogram for malignant neoplasm of breast: Secondary | ICD-10-CM | POA: Diagnosis not present

## 2014-01-15 ENCOUNTER — Other Ambulatory Visit: Payer: Self-pay | Admitting: Family Medicine

## 2014-01-15 DIAGNOSIS — R928 Other abnormal and inconclusive findings on diagnostic imaging of breast: Secondary | ICD-10-CM

## 2014-01-27 ENCOUNTER — Ambulatory Visit (HOSPITAL_COMMUNITY)
Admission: RE | Admit: 2014-01-27 | Discharge: 2014-01-27 | Disposition: A | Discharge status: Home / Self Care | Payer: Medicare Other | Source: Ambulatory Visit | Attending: Family Medicine | Admitting: Family Medicine

## 2014-01-27 ENCOUNTER — Encounter: Payer: Self-pay | Admitting: Family Medicine

## 2014-01-27 DIAGNOSIS — R928 Other abnormal and inconclusive findings on diagnostic imaging of breast: Secondary | ICD-10-CM | POA: Diagnosis not present

## 2014-01-27 DIAGNOSIS — R921 Mammographic calcification found on diagnostic imaging of breast: Secondary | ICD-10-CM | POA: Diagnosis not present

## 2014-05-28 ENCOUNTER — Ambulatory Visit (INDEPENDENT_AMBULATORY_CARE_PROVIDER_SITE_OTHER): Payer: Medicare Other | Admitting: Family Medicine

## 2014-05-28 ENCOUNTER — Encounter: Payer: Self-pay | Admitting: Family Medicine

## 2014-05-28 VITALS — BP 124/74 | Ht 61.0 in | Wt 141.4 lb

## 2014-05-28 DIAGNOSIS — I10 Essential (primary) hypertension: Secondary | ICD-10-CM | POA: Diagnosis not present

## 2014-05-28 DIAGNOSIS — E785 Hyperlipidemia, unspecified: Secondary | ICD-10-CM

## 2014-05-28 LAB — BASIC METABOLIC PANEL
BUN: 20 mg/dL (ref 6–23)
CO2: 25 mEq/L (ref 19–32)
Calcium: 9 mg/dL (ref 8.4–10.5)
Chloride: 105 mEq/L (ref 96–112)
Creat: 0.66 mg/dL (ref 0.50–1.10)
Glucose, Bld: 88 mg/dL (ref 70–99)
Potassium: 4.3 mEq/L (ref 3.5–5.3)
Sodium: 140 mEq/L (ref 135–145)

## 2014-05-28 LAB — LIPID PANEL
Cholesterol: 204 mg/dL — ABNORMAL HIGH (ref 0–200)
HDL: 60 mg/dL (ref >39)
LDL Cholesterol: 132 mg/dL — ABNORMAL HIGH (ref 0–99)
Total CHOL/HDL Ratio: 3.4 Ratio
Triglycerides: 62 mg/dL (ref <150)
VLDL: 12 mg/dL (ref 0–40)

## 2014-05-28 LAB — HEPATIC FUNCTION PANEL
ALT: 10 U/L (ref 0–35)
AST: 12 U/L (ref 0–37)
Albumin: 4.5 g/dL (ref 3.5–5.2)
Alkaline Phosphatase: 53 U/L (ref 39–117)
Bilirubin, Direct: 0.1 mg/dL (ref 0.0–0.3)
Indirect Bilirubin: 0.7 mg/dL (ref 0.2–1.2)
Total Bilirubin: 0.8 mg/dL (ref 0.2–1.2)
Total Protein: 7.1 g/dL (ref 6.0–8.3)

## 2014-05-28 MED ORDER — VALACYCLOVIR HCL 1 G PO TABS: ORAL_TABLET | ORAL | Status: DC

## 2014-05-28 MED ORDER — VERAPAMIL HCL ER 180 MG PO TBCR: 180.0000 mg | EXTENDED_RELEASE_TABLET | Freq: Every day | ORAL | Status: DC

## 2014-05-28 NOTE — Progress Notes (Signed)
   Subjective:    Patient ID: Hannah Roy, female    DOB: [birth year], 74 y.o.   MRN: [id number]  HPI Patient arrives for a follow up on blood pressure. Patient states she is trying to walk everyday. We went over the importance of regular physical activity watching diet taking medication. We also went over the importance of staying physically active. Patient also relates that she gets intermittent cold sores maybe a couple times year she would like Valtrex sent in. Review of Systems  Constitutional: Negative for activity change, appetite change and fatigue.  HENT: Negative for congestion.   Respiratory: Negative for cough.   Cardiovascular: Negative for chest pain.  Gastrointestinal: Negative for abdominal pain.  Endocrine: Negative for polydipsia and polyphagia.  Neurological: Negative for weakness.  Psychiatric/Behavioral: Negative for confusion.       Objective:   Physical Exam  Constitutional: She appears well-nourished. No distress.  Cardiovascular: Normal rate, regular rhythm and normal heart sounds.   No murmur heard. Pulmonary/Chest: Effort normal and breath sounds normal. No respiratory distress.  Musculoskeletal: She exhibits no edema.  Lymphadenopathy:    She has no cervical adenopathy.  Neurological: She is alert. She exhibits normal muscle tone.  Psychiatric: Her behavior is normal.  Vitals reviewed.         Assessment & Plan:  Hypertension-decent control continue current measures follow-up if ongoing troubles  Osteopenia she will need a bone density test later this fall  Hyperlipidemia check lab work today watch diet closely.  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

## 2014-05-28 NOTE — Patient Instructions (Signed)
DASH Eating Plan °DASH stands for "Dietary Approaches to Stop Hypertension." The DASH eating plan is a healthy eating plan that has been shown to reduce high blood pressure (hypertension). Additional health benefits may include reducing the risk of type 2 diabetes mellitus, heart disease, and stroke. The DASH eating plan may also help with weight loss. °WHAT DO I NEED TO KNOW ABOUT THE DASH EATING PLAN? °For the DASH eating plan, you will follow these general guidelines: °· Choose foods with a percent daily value for sodium of less than 5% (as listed on the food label). °· Use salt-free seasonings or herbs instead of table salt or sea salt. °· Check with your health care provider or pharmacist before using salt substitutes. °· Eat lower-sodium products, often labeled as "lower sodium" or "no salt added." °· Eat fresh foods. °· Eat more vegetables, fruits, and low-fat dairy products. °· Choose whole grains. Look for the word "whole" as the first word in the ingredient list. °· Choose fish and skinless chicken or turkey more often than red meat. Limit fish, poultry, and meat to 6 oz (170 g) each day. °· Limit sweets, desserts, sugars, and sugary drinks. °· Choose heart-healthy fats. °· Limit cheese to 1 oz (28 g) per day. °· Eat more home-cooked food and less restaurant, buffet, and fast food. °· Limit fried foods. °· Cook foods using methods other than frying. °· Limit canned vegetables. If you do use them, rinse them well to decrease the sodium. °· When eating at a restaurant, ask that your food be prepared with less salt, or no salt if possible. °WHAT FOODS CAN I EAT? °Seek help from a dietitian for individual calorie needs. °Grains °Whole grain or whole wheat bread. Brown rice. Whole grain or whole wheat pasta. Quinoa, bulgur, and whole grain cereals. Low-sodium cereals. Corn or whole wheat flour tortillas. Whole grain cornbread. Whole grain crackers. Low-sodium crackers. °Vegetables °Fresh or frozen vegetables  (raw, steamed, roasted, or grilled). Low-sodium or reduced-sodium tomato and vegetable juices. Low-sodium or reduced-sodium tomato sauce and paste. Low-sodium or reduced-sodium canned vegetables.  °Fruits °All fresh, canned (in natural juice), or frozen fruits. °Meat and Other Protein Products °Ground beef (85% or leaner), grass-fed beef, or beef trimmed of fat. Skinless chicken or turkey. Ground chicken or turkey. Pork trimmed of fat. All fish and seafood. Eggs. Dried beans, peas, or lentils. Unsalted nuts and seeds. Unsalted canned beans. °Dairy °Low-fat dairy products, such as skim or 1% milk, 2% or reduced-fat cheeses, low-fat ricotta or cottage cheese, or plain low-fat yogurt. Low-sodium or reduced-sodium cheeses. °Fats and Oils °Tub margarines without trans fats. Light or reduced-fat mayonnaise and salad dressings (reduced sodium). Avocado. Safflower, olive, or canola oils. Natural peanut or almond butter. °Other °Unsalted popcorn and pretzels. °The items listed above may not be a complete list of recommended foods or beverages. Contact your dietitian for more options. °WHAT FOODS ARE NOT RECOMMENDED? °Grains °White bread. White pasta. White rice. Refined cornbread. Bagels and croissants. Crackers that contain trans fat. °Vegetables °Creamed or fried vegetables. Vegetables in a cheese sauce. Regular canned vegetables. Regular canned tomato sauce and paste. Regular tomato and vegetable juices. °Fruits °Dried fruits. Canned fruit in light or heavy syrup. Fruit juice. °Meat and Other Protein Products °Fatty cuts of meat. Ribs, chicken wings, bacon, sausage, bologna, salami, chitterlings, fatback, hot dogs, bratwurst, and packaged luncheon meats. Salted nuts and seeds. Canned beans with salt. °Dairy °Whole or 2% milk, cream, half-and-half, and cream cheese. Whole-fat or sweetened yogurt. Full-fat   cheeses or blue cheese. Nondairy creamers and whipped toppings. Processed cheese, cheese spreads, or cheese  curds. °Condiments °Onion and garlic salt, seasoned salt, table salt, and sea salt. Canned and packaged gravies. Worcestershire sauce. Tartar sauce. Barbecue sauce. Teriyaki sauce. Soy sauce, including reduced sodium. Steak sauce. Fish sauce. Oyster sauce. Cocktail sauce. Horseradish. Ketchup and mustard. Meat flavorings and tenderizers. Bouillon cubes. Hot sauce. Tabasco sauce. Marinades. Taco seasonings. Relishes. °Fats and Oils °Butter, stick margarine, lard, shortening, ghee, and bacon fat. Coconut, palm kernel, or palm oils. Regular salad dressings. °Other °Pickles and olives. Salted popcorn and pretzels. °The items listed above may not be a complete list of foods and beverages to avoid. Contact your dietitian for more information. °WHERE CAN I FIND MORE INFORMATION? °[employer], [employer]: www.nhlbi.nih.gov/health/health-topics/topics/dash/ °Document Released: 03/16/2011 Document Revised: 08/11/2013 Document Reviewed: 01/29/2013 °Exit[employer]® Patient Information ©2015 [employer]. This information is not intended to replace advice given to you by your health care provider. Make sure you discuss any questions you have with your health care provider. ° °

## 2014-05-29 NOTE — Progress Notes (Signed)
Patient notified and verbalized understanding of the test results. No further questions. 

## 2014-08-05 ENCOUNTER — Telehealth: Payer: Self-pay | Admitting: Family Medicine

## 2014-08-05 NOTE — Telephone Encounter (Signed)
Rx prior auth APPROVED for pt's valACYclovir (VALTREX) 1000 MG tablet, expires 08/01/15

## 2014-08-19 ENCOUNTER — Telehealth: Payer: Self-pay | Admitting: Family Medicine

## 2014-08-19 MED ORDER — VALACYCLOVIR HCL 1 G PO TABS: ORAL_TABLET | ORAL | Status: DC

## 2014-08-19 NOTE — Telephone Encounter (Signed)
The original prescription for her Valtrex was denied not quite sure why but resubmit this prescription as well as: Valtrex 1 g tablet, take 2 by mouth twice a day for maximum of 3 days with flareups of cold sores. #12 with 3 refills

## 2014-08-19 NOTE — Telephone Encounter (Signed)
Rx sent electronically to pharmacy. 

## 2014-08-19 NOTE — Addendum Note (Signed)
Addended by: Dairl Ponder on: 08/19/2014 08:53 AM   Modules accepted: Orders

## 2014-10-01 ENCOUNTER — Ambulatory Visit (INDEPENDENT_AMBULATORY_CARE_PROVIDER_SITE_OTHER): Payer: Medicare Other | Admitting: Family Medicine

## 2014-10-01 ENCOUNTER — Encounter: Payer: Self-pay | Admitting: Family Medicine

## 2014-10-01 ENCOUNTER — Ambulatory Visit (HOSPITAL_COMMUNITY)
Admission: RE | Admit: 2014-10-01 | Discharge: 2014-10-01 | Disposition: A | Discharge status: Home / Self Care | Payer: Medicare Other | Source: Ambulatory Visit | Attending: Family Medicine | Admitting: Family Medicine

## 2014-10-01 VITALS — BP 122/80 | Temp 98.4°F | Wt 141.0 lb

## 2014-10-01 DIAGNOSIS — M19041 Primary osteoarthritis, right hand: Secondary | ICD-10-CM

## 2014-10-01 DIAGNOSIS — I10 Essential (primary) hypertension: Secondary | ICD-10-CM

## 2014-10-01 DIAGNOSIS — T148 Other injury of unspecified body region: Secondary | ICD-10-CM

## 2014-10-01 DIAGNOSIS — W57XXXA Bitten or stung by nonvenomous insect and other nonvenomous arthropods, initial encounter: Secondary | ICD-10-CM | POA: Diagnosis not present

## 2014-10-01 MED ORDER — CLOBETASOL PROPIONATE 0.05 % EX CREA: TOPICAL_CREAM | CUTANEOUS | Status: DC

## 2014-10-01 NOTE — Progress Notes (Signed)
   Subjective:    Patient ID: Hannah Roy, female    DOB: 1941/02/01, 74 y.o.   MRN: [id number]  HPItick bites in April. Pt brought in the ticks. Areas not healing. One on left arm and several on right thigh and buttock. Area is itching. Using alcholol and peroxide. No relief.   She relates that arthritic condition in her hand seems be getting worse she denies any other place she is worried that she may become crippled because of bed she also states that the areas where she had tick bites he is getting hyperpigmented.  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 She does not have any other signs of arthritic changes.      Assessment & Plan:  1. Essential hypertension, benign  blood pressure actually doing good continue low-dose of current medicine watch diet stay physically active  2. Primary osteoarthritis of right hand  significant osteoarthritis of the distal joints in the right hand index and middle finger we will do x-rays to see if there is destructive changes going on we'll also be doing some lab work. May need rheumatology - DG Hand Complete Right - Rheumatoid factor   tick bites- I believe that this is scarring for the tick bite site recommend sterile endocrine should gradually get better no need for any type of other interventions currently no sign of infection   Patient follow-up within 6 months - Sedimentation rate - Antinuclear Antib (ANA)   The tick bites better on the legs are indicative of some scarring and pigmentation related to the tick bite I find no evidence of retained tick parts I find no evidence of cellulitis

## 2014-10-02 LAB — ANA: Anti Nuclear Antibody(ANA): NEGATIVE

## 2014-10-02 LAB — RHEUMATOID FACTOR: Rhuematoid fact SerPl-aCnc: 8.5 IU/mL (ref 0.0–13.9)

## 2014-10-02 LAB — SEDIMENTATION RATE: Sed Rate: 7 mm/hr (ref 0–40)

## 2015-01-05 DIAGNOSIS — H2513 Age-related nuclear cataract, bilateral: Secondary | ICD-10-CM | POA: Diagnosis not present

## 2015-01-06 ENCOUNTER — Other Ambulatory Visit: Payer: Self-pay | Admitting: Family Medicine

## 2015-01-06 DIAGNOSIS — Z1231 Encounter for screening mammogram for malignant neoplasm of breast: Secondary | ICD-10-CM

## 2015-01-07 ENCOUNTER — Ambulatory Visit: Payer: Medicare Other | Admitting: Family Medicine

## 2015-01-12 ENCOUNTER — Ambulatory Visit (INDEPENDENT_AMBULATORY_CARE_PROVIDER_SITE_OTHER): Payer: Medicare Other | Admitting: Family Medicine

## 2015-01-12 ENCOUNTER — Encounter: Payer: Self-pay | Admitting: Family Medicine

## 2015-01-12 VITALS — BP 132/80 | Ht 61.0 in | Wt 138.2 lb

## 2015-01-12 DIAGNOSIS — I10 Essential (primary) hypertension: Secondary | ICD-10-CM | POA: Diagnosis not present

## 2015-01-12 DIAGNOSIS — Z23 Encounter for immunization: Secondary | ICD-10-CM

## 2015-01-12 DIAGNOSIS — Z780 Asymptomatic menopausal state: Secondary | ICD-10-CM | POA: Diagnosis not present

## 2015-01-12 MED ORDER — VALACYCLOVIR HCL 1 G PO TABS: ORAL_TABLET | ORAL | Status: DC

## 2015-01-12 MED ORDER — VERAPAMIL HCL ER 180 MG PO TBCR: 180.0000 mg | EXTENDED_RELEASE_TABLET | Freq: Every day | ORAL | Status: DC

## 2015-01-12 NOTE — Progress Notes (Signed)
   Subjective:    Patient ID: Hannah Roy, female    DOB: [birth year], 74 y.o.   MRN: [id number]  Hypertension This is a chronic problem. The current episode started more than 1 year ago. Pertinent negatives include no chest pain. Risk factors for coronary artery disease include dyslipidemia. Treatments tried: verapamil. There are no compliance problems.    Patient takes her medicine she denies any chest tightness pressure pain shortness of breath she states it keeps blood pressure under good control occasionally she gets dizzy when she stands up but nothing severe.   Review of Systems  Constitutional: Negative for activity change, appetite change and fatigue.  HENT: Negative for congestion.   Respiratory: Negative for cough.   Cardiovascular: Negative for chest pain.  Gastrointestinal: Negative for abdominal pain.  Endocrine: Negative for polydipsia and polyphagia.  Neurological: Negative for weakness.  Psychiatric/Behavioral: Negative for confusion.       Objective:   Physical Exam  Constitutional: She appears well-nourished. No distress.  Cardiovascular: Normal rate, regular rhythm and normal heart sounds.   No murmur heard. Pulmonary/Chest: Effort normal and breath sounds normal. No respiratory distress.  Musculoskeletal: She exhibits no edema.  Lymphadenopathy:    She has no cervical adenopathy.  Neurological: She is alert. She exhibits normal muscle tone.  Psychiatric: Her behavior is normal.  Vitals reviewed.   Orthostatics negative      Assessment & Plan:  Hypertension overall patient doing well we will continue current measures. No needed any changes orthostatics are negative.  Patient is do bone density will go ahead and order that today  Lab work on visit in the spring time

## 2015-01-15 ENCOUNTER — Other Ambulatory Visit: Payer: Self-pay

## 2015-01-15 DIAGNOSIS — Z780 Asymptomatic menopausal state: Secondary | ICD-10-CM

## 2015-01-18 ENCOUNTER — Ambulatory Visit (HOSPITAL_COMMUNITY)
Admission: RE | Admit: 2015-01-18 | Discharge: 2015-01-18 | Disposition: A | Discharge status: Home / Self Care | Payer: Medicare Other | Source: Ambulatory Visit | Attending: Family Medicine | Admitting: Family Medicine

## 2015-01-18 ENCOUNTER — Other Ambulatory Visit (HOSPITAL_COMMUNITY): Payer: Medicare Other

## 2015-01-18 ENCOUNTER — Ambulatory Visit (HOSPITAL_COMMUNITY): Payer: Medicare Other

## 2015-01-18 DIAGNOSIS — Z780 Asymptomatic menopausal state: Secondary | ICD-10-CM | POA: Diagnosis not present

## 2015-01-18 DIAGNOSIS — M8589 Other specified disorders of bone density and structure, multiple sites: Secondary | ICD-10-CM | POA: Diagnosis not present

## 2015-01-18 DIAGNOSIS — Z1231 Encounter for screening mammogram for malignant neoplasm of breast: Secondary | ICD-10-CM

## 2015-01-18 DIAGNOSIS — Z13820 Encounter for screening for osteoporosis: Secondary | ICD-10-CM | POA: Diagnosis not present

## 2015-01-19 ENCOUNTER — Encounter: Payer: Self-pay | Admitting: Family Medicine

## 2015-01-20 ENCOUNTER — Other Ambulatory Visit (HOSPITAL_COMMUNITY): Payer: Medicare Other

## 2015-07-16 ENCOUNTER — Ambulatory Visit (INDEPENDENT_AMBULATORY_CARE_PROVIDER_SITE_OTHER): Payer: Medicare Other | Admitting: Family Medicine

## 2015-07-16 ENCOUNTER — Encounter: Payer: Self-pay | Admitting: Family Medicine

## 2015-07-16 VITALS — BP 118/80 | Ht 61.0 in | Wt 135.4 lb

## 2015-07-16 DIAGNOSIS — I10 Essential (primary) hypertension: Secondary | ICD-10-CM

## 2015-07-16 DIAGNOSIS — E785 Hyperlipidemia, unspecified: Secondary | ICD-10-CM

## 2015-07-16 DIAGNOSIS — Z1159 Encounter for screening for other viral diseases: Secondary | ICD-10-CM | POA: Diagnosis not present

## 2015-07-16 MED ORDER — VERAPAMIL HCL ER 180 MG PO TBCR: 180.0000 mg | EXTENDED_RELEASE_TABLET | Freq: Every day | ORAL | Status: DC

## 2015-07-16 NOTE — Progress Notes (Signed)
   Subjective:    Patient ID: Hannah Roy, female    DOB: September 30, 1940, 75 y.o.   MRN: [id number]  Hypertension This is a chronic problem. The current episode started more than 1 year ago. The problem has been gradually improving since onset. Pertinent negatives include no chest pain. There are no associated agents to hypertension. There are no known risk factors for coronary artery disease. Treatments tried: verapamil. The current treatment provides moderate improvement. There are no compliance problems.    Patient states that she is very fatigue and has dizzy spells. She would like to talk to the doctor about this. Occasional dizziness that she relates is feeling slightly unsteady for a minute or 2 no unilateral numbness or weakness and sometimes feels tired denies any symptoms of sleep apnea but she does have mild insomnia we talked about ways to help with that Patient has questions about the HEP C testing for baby boomers.  Husband bladder cancer  Review of Systems  Constitutional: Negative for activity change, appetite change and fatigue.  HENT: Negative for congestion.   Respiratory: Negative for cough.   Cardiovascular: Negative for chest pain.  Gastrointestinal: Negative for abdominal pain.  Endocrine: Negative for polydipsia and polyphagia.  Neurological: Negative for weakness.  Psychiatric/Behavioral: Negative for confusion.       Objective:   Physical Exam  Constitutional: She appears well-nourished. No distress.  Cardiovascular: Normal rate, regular rhythm and normal heart sounds.   No murmur heard. Pulmonary/Chest: Effort normal and breath sounds normal. No respiratory distress.  Musculoskeletal: She exhibits no edema.  Lymphadenopathy:    She has no cervical adenopathy.  Neurological: She is alert. She exhibits normal muscle tone.  Psychiatric: Her behavior is normal.  Vitals reviewed.         Assessment & Plan:  Hypertension overall patient doing very well with  this continue current measures. Not orthostatic on readings today. Occasional dizziness there is no strokelike symptoms in talking with the patient she was told if this becomes more frequent follow-up Lab work ordered Follow-up 6 months

## 2015-07-17 LAB — BASIC METABOLIC PANEL
BUN/Creatinine Ratio: 20 (ref 12–28)
BUN: 16 mg/dL (ref 8–27)
CO2: 24 mmol/L (ref 18–29)
Calcium: 9.6 mg/dL (ref 8.7–10.3)
Chloride: 103 mmol/L (ref 96–106)
Creatinine, Ser: 0.79 mg/dL (ref 0.57–1.00)
GFR calc Af Amer: 86 mL/min/{1.73_m2} (ref >59)
GFR calc non Af Amer: 75 mL/min/{1.73_m2} (ref >59)
Glucose: 91 mg/dL (ref 65–99)
Potassium: 4.5 mmol/L (ref 3.5–5.2)
Sodium: 142 mmol/L (ref 134–144)

## 2015-07-17 LAB — LIPID PANEL
Chol/HDL Ratio: 3.2 ratio units (ref 0.0–4.4)
Cholesterol, Total: 212 mg/dL — ABNORMAL HIGH (ref 100–199)
HDL: 67 mg/dL (ref >39)
LDL Calculated: 132 mg/dL — ABNORMAL HIGH (ref 0–99)
Triglycerides: 66 mg/dL (ref 0–149)
VLDL Cholesterol Cal: 13 mg/dL (ref 5–40)

## 2015-07-17 LAB — HEPATITIS C ANTIBODY: Hep C Virus Ab: <0.1 s/co ratio (ref 0.0–0.9)

## 2016-01-13 ENCOUNTER — Ambulatory Visit: Payer: Medicare Other | Admitting: Family Medicine

## 2016-01-20 ENCOUNTER — Ambulatory Visit (INDEPENDENT_AMBULATORY_CARE_PROVIDER_SITE_OTHER): Payer: Medicare Other | Admitting: Family Medicine

## 2016-01-20 ENCOUNTER — Other Ambulatory Visit: Payer: Self-pay | Admitting: Family Medicine

## 2016-01-20 ENCOUNTER — Encounter: Payer: Self-pay | Admitting: Family Medicine

## 2016-01-20 VITALS — BP 128/82 | Ht 61.0 in | Wt 135.1 lb

## 2016-01-20 DIAGNOSIS — Z1231 Encounter for screening mammogram for malignant neoplasm of breast: Secondary | ICD-10-CM

## 2016-01-20 DIAGNOSIS — E784 Other hyperlipidemia: Secondary | ICD-10-CM

## 2016-01-20 DIAGNOSIS — Z23 Encounter for immunization: Secondary | ICD-10-CM

## 2016-01-20 DIAGNOSIS — I10 Essential (primary) hypertension: Secondary | ICD-10-CM

## 2016-01-20 DIAGNOSIS — E7849 Other hyperlipidemia: Secondary | ICD-10-CM

## 2016-01-20 MED ORDER — VERAPAMIL HCL ER 180 MG PO TBCR: 180.0000 mg | EXTENDED_RELEASE_TABLET | Freq: Every day | ORAL | 2 refills | Status: DC

## 2016-01-20 NOTE — Progress Notes (Signed)
   Subjective:    Patient ID: Hannah Roy, female    DOB: Jul 16, 1940, 75 y.o.   MRN: [id number]  Hypertension  This is a chronic problem. The current episode started more than 1 year ago. Pertinent negatives include no chest pain, headaches or shortness of breath. There are no compliance problems.    Patient has concerns of recent dizzy spell. Patient states that the dizzy spell later. He will log last only a few moments it was when she was standing she denied and chest tightness pressure pain or shortness of breath she denies any trouble when she stands up she relates compliance with exercise diet and medication she sometimes is walking 5 miles a day   Review of Systems  Constitutional: Negative for activity change, fatigue and fever.  Respiratory: Negative for cough and shortness of breath.   Cardiovascular: Negative for chest pain and leg swelling.  Neurological: Negative for headaches.       Objective:   Physical Exam  Constitutional: She appears well-nourished. No distress.  Cardiovascular: Normal rate, regular rhythm and normal heart sounds.   No murmur heard. Pulmonary/Chest: Effort normal and breath sounds normal. No respiratory distress.  Musculoskeletal: She exhibits no edema.  Lymphadenopathy:    She has no cervical adenopathy.  Neurological: She is alert. She exhibits normal muscle tone.  Psychiatric: Her behavior is normal.  Vitals reviewed.   Carotid exam done. No bruit.      Assessment & Plan:  Her blood pressure overall very good continue medication Importance of healthy diet discussed with the patient History of borderline hyperlipidemia importance for the patient to get LDL down below her progress heart disease J lipid and liver profile Because of blood pressure medicine just met 7 I did do blood pressures orthostatics and they were normal if ongoing troubles with dizziness or troubles follow-up for further evaluation

## 2016-01-21 LAB — LIPID PANEL
Chol/HDL Ratio: 3.2 ratio units (ref 0.0–4.4)
Cholesterol, Total: 197 mg/dL (ref 100–199)
HDL: 62 mg/dL (ref >39)
LDL Calculated: 123 mg/dL — ABNORMAL HIGH (ref 0–99)
Triglycerides: 60 mg/dL (ref 0–149)
VLDL Cholesterol Cal: 12 mg/dL (ref 5–40)

## 2016-01-21 LAB — BASIC METABOLIC PANEL
BUN/Creatinine Ratio: 20 (ref 12–28)
BUN: 17 mg/dL (ref 8–27)
CO2: 25 mmol/L (ref 18–29)
Calcium: 9.3 mg/dL (ref 8.7–10.3)
Chloride: 104 mmol/L (ref 96–106)
Creatinine, Ser: 0.84 mg/dL (ref 0.57–1.00)
GFR calc Af Amer: 80 mL/min/{1.73_m2} (ref >59)
GFR calc non Af Amer: 69 mL/min/{1.73_m2} (ref >59)
Glucose: 85 mg/dL (ref 65–99)
Potassium: 4.5 mmol/L (ref 3.5–5.2)
Sodium: 142 mmol/L (ref 134–144)

## 2016-01-21 LAB — HEPATIC FUNCTION PANEL
ALT: 6 IU/L (ref 0–32)
AST: 12 IU/L (ref 0–40)
Albumin: 4.6 g/dL (ref 3.5–4.8)
Alkaline Phosphatase: 56 IU/L (ref 39–117)
Bilirubin Total: 0.8 mg/dL (ref 0.0–1.2)
Bilirubin, Direct: 0.2 mg/dL (ref 0.00–0.40)
Total Protein: 7.3 g/dL (ref 6.0–8.5)

## 2016-01-23 ENCOUNTER — Encounter: Payer: Self-pay | Admitting: Family Medicine

## 2016-01-31 ENCOUNTER — Ambulatory Visit (HOSPITAL_COMMUNITY)
Admission: RE | Admit: 2016-01-31 | Discharge: 2016-01-31 | Disposition: A | Discharge status: Home / Self Care | Payer: Medicare Other | Source: Ambulatory Visit | Attending: Family Medicine | Admitting: Family Medicine

## 2016-01-31 DIAGNOSIS — Z1231 Encounter for screening mammogram for malignant neoplasm of breast: Secondary | ICD-10-CM | POA: Insufficient documentation

## 2016-03-08 ENCOUNTER — Encounter (INDEPENDENT_AMBULATORY_CARE_PROVIDER_SITE_OTHER): Payer: Self-pay | Admitting: *Deleted

## 2016-03-09 ENCOUNTER — Encounter (INDEPENDENT_AMBULATORY_CARE_PROVIDER_SITE_OTHER): Payer: Self-pay | Admitting: *Deleted

## 2016-07-14 ENCOUNTER — Ambulatory Visit (INDEPENDENT_AMBULATORY_CARE_PROVIDER_SITE_OTHER): Payer: Medicare Other | Admitting: Family Medicine

## 2016-07-14 ENCOUNTER — Ambulatory Visit (HOSPITAL_COMMUNITY)
Admission: RE | Admit: 2016-07-14 | Discharge: 2016-07-14 | Disposition: A | Discharge status: Home / Self Care | Payer: Medicare Other | Source: Ambulatory Visit | Attending: Family Medicine | Admitting: Family Medicine

## 2016-07-14 VITALS — BP 122/68 | Ht 61.0 in | Wt 131.0 lb

## 2016-07-14 DIAGNOSIS — E7849 Other hyperlipidemia: Secondary | ICD-10-CM

## 2016-07-14 DIAGNOSIS — M7542 Impingement syndrome of left shoulder: Secondary | ICD-10-CM | POA: Insufficient documentation

## 2016-07-14 DIAGNOSIS — I10 Essential (primary) hypertension: Secondary | ICD-10-CM

## 2016-07-14 DIAGNOSIS — E784 Other hyperlipidemia: Secondary | ICD-10-CM | POA: Diagnosis not present

## 2016-07-14 DIAGNOSIS — Z1211 Encounter for screening for malignant neoplasm of colon: Secondary | ICD-10-CM

## 2016-07-14 DIAGNOSIS — I7090 Unspecified atherosclerosis: Secondary | ICD-10-CM | POA: Diagnosis not present

## 2016-07-14 DIAGNOSIS — M71372 Other bursal cyst, left ankle and foot: Secondary | ICD-10-CM | POA: Diagnosis not present

## 2016-07-14 DIAGNOSIS — M25512 Pain in left shoulder: Secondary | ICD-10-CM | POA: Diagnosis not present

## 2016-07-14 DIAGNOSIS — M25812 Other specified joint disorders, left shoulder: Secondary | ICD-10-CM

## 2016-07-14 DIAGNOSIS — M19012 Primary osteoarthritis, left shoulder: Secondary | ICD-10-CM | POA: Diagnosis not present

## 2016-07-14 NOTE — Progress Notes (Signed)
   Subjective:    Patient ID: Hannah Roy, female    DOB: December 02, 1940, 76 y.o.   MRN: [id number]  Hypertension  This is a chronic problem. The current episode started more than 1 year ago. Pertinent negatives include no chest pain, headaches or shortness of breath. There are no compliance problems (walks every day).    Neck pain and left shoulder pain for 2 months. Cannot raise arm all the way up.  No injury  Review of Systems  Constitutional: Negative for activity change, fatigue and fever.  Respiratory: Negative for cough and shortness of breath.   Cardiovascular: Negative for chest pain and leg swelling.  Neurological: Negative for headaches.       Objective:   Physical Exam  Constitutional: She appears well-nourished. No distress.  Cardiovascular: Normal rate, regular rhythm and normal heart sounds.   No murmur heard. Pulmonary/Chest: Effort normal and breath sounds normal. No respiratory distress.  Musculoskeletal: She exhibits no edema.  Lymphadenopathy:    She has no cervical adenopathy.  Neurological: She is alert. She exhibits normal muscle tone.  Psychiatric: Her behavior is normal.  Vitals reviewed.   Has limited range of motion of the left shoulder although not severe no obvious rotator cuff more than likely impingement Patient with a cyst on the left ankle it does not bother her currently referral to orthopedics if it does start bothering her.     Assessment & Plan:  X-ray indicated Anti-inflammatory when necessary Exercises shown and information given If not doing better within the next 6 weeks to call us and we will set up orthopedics  Blood pressure good control continue current measures  Referral for colonoscopy follow-up

## 2016-07-15 LAB — LIPID PANEL
Chol/HDL Ratio: 3.2 ratio (ref 0.0–4.4)
Cholesterol, Total: 209 mg/dL — ABNORMAL HIGH (ref 100–199)
HDL: 65 mg/dL (ref >39)
LDL Calculated: 128 mg/dL — ABNORMAL HIGH (ref 0–99)
Triglycerides: 78 mg/dL (ref 0–149)
VLDL Cholesterol Cal: 16 mg/dL (ref 5–40)

## 2016-07-17 NOTE — Addendum Note (Signed)
Addended by: Dairl Ponder on: 07/17/2016 02:01 PM   Modules accepted: Orders

## 2016-07-20 ENCOUNTER — Encounter: Payer: Self-pay | Admitting: Family Medicine

## 2016-07-20 ENCOUNTER — Encounter (INDEPENDENT_AMBULATORY_CARE_PROVIDER_SITE_OTHER): Payer: Self-pay | Admitting: *Deleted

## 2016-08-03 ENCOUNTER — Other Ambulatory Visit (INDEPENDENT_AMBULATORY_CARE_PROVIDER_SITE_OTHER): Payer: Self-pay | Admitting: *Deleted

## 2016-08-03 DIAGNOSIS — Z860100 Personal history of colon polyps, unspecified: Secondary | ICD-10-CM | POA: Insufficient documentation

## 2016-08-23 ENCOUNTER — Other Ambulatory Visit: Payer: Self-pay | Admitting: Family Medicine

## 2016-10-10 ENCOUNTER — Telehealth (INDEPENDENT_AMBULATORY_CARE_PROVIDER_SITE_OTHER): Payer: Self-pay | Admitting: *Deleted

## 2016-10-10 ENCOUNTER — Encounter (INDEPENDENT_AMBULATORY_CARE_PROVIDER_SITE_OTHER): Payer: Self-pay | Admitting: *Deleted

## 2016-10-10 DIAGNOSIS — Z86010 Personal history of colonic polyps: Secondary | ICD-10-CM

## 2016-10-10 NOTE — Telephone Encounter (Signed)
Patient needs trilyte -- hx polyps

## 2016-10-10 NOTE — Telephone Encounter (Signed)
Referring MD/PCP: scott luking   Procedure: tcs  Reason/Indication:  Hx polyps  Has patient had this procedure before?  Yes, 2012  If so, when, by whom and where?    Is there a family history of colon cancer?  no  Who?  What age when diagnosed?    Is patient diabetic?   no      Does patient have prosthetic heart valve or mechanical valve?  no  Do you have a pacemaker?  no  Has patient ever had endocarditis? no  Has patient had joint replacement within last 12 months?  no  Does patient tend to be constipated or take laxatives? no  Does patient have a history of alcohol/drug use?  no  Is patient on Coumadin, Plavix and/or Aspirin? no  Medications: see epic  Allergies: see epic  Medication Adjustment per Dr Laural Golden:   Procedure date & time: 11/09/16 at 1030

## 2016-10-12 MED ORDER — PEG 3350-KCL-NA BICARB-NACL 420 G PO SOLR: 4000.0000 mL | Freq: Once | ORAL | 0 refills | Status: AC

## 2016-10-12 NOTE — Telephone Encounter (Signed)
agree

## 2016-11-03 ENCOUNTER — Other Ambulatory Visit: Payer: Self-pay

## 2016-11-09 ENCOUNTER — Encounter (HOSPITAL_COMMUNITY): Payer: Self-pay | Admitting: *Deleted

## 2016-11-09 ENCOUNTER — Ambulatory Visit (HOSPITAL_COMMUNITY)
Admission: RE | Admit: 2016-11-09 | Discharge: 2016-11-09 | Disposition: A | Discharge status: Home / Self Care | Payer: Medicare Other | Source: Ambulatory Visit | Attending: Internal Medicine | Admitting: Internal Medicine

## 2016-11-09 ENCOUNTER — Encounter (HOSPITAL_COMMUNITY)
Admission: RE | Disposition: A | Discharge status: Home / Self Care | Payer: Self-pay | Source: Ambulatory Visit | Attending: Internal Medicine

## 2016-11-09 DIAGNOSIS — K644 Residual hemorrhoidal skin tags: Secondary | ICD-10-CM | POA: Diagnosis not present

## 2016-11-09 DIAGNOSIS — Z860100 Personal history of colon polyps, unspecified: Secondary | ICD-10-CM | POA: Insufficient documentation

## 2016-11-09 DIAGNOSIS — D120 Benign neoplasm of cecum: Secondary | ICD-10-CM | POA: Diagnosis not present

## 2016-11-09 DIAGNOSIS — Z1211 Encounter for screening for malignant neoplasm of colon: Secondary | ICD-10-CM | POA: Diagnosis not present

## 2016-11-09 DIAGNOSIS — D125 Benign neoplasm of sigmoid colon: Secondary | ICD-10-CM | POA: Insufficient documentation

## 2016-11-09 DIAGNOSIS — I10 Essential (primary) hypertension: Secondary | ICD-10-CM | POA: Diagnosis not present

## 2016-11-09 DIAGNOSIS — Z87891 Personal history of nicotine dependence: Secondary | ICD-10-CM | POA: Diagnosis not present

## 2016-11-09 DIAGNOSIS — Z09 Encounter for follow-up examination after completed treatment for conditions other than malignant neoplasm: Secondary | ICD-10-CM | POA: Diagnosis not present

## 2016-11-09 HISTORY — PX: COLONOSCOPY: SHX5424

## 2016-11-09 SURGERY — COLONOSCOPY
Anesthesia: Moderate Sedation

## 2016-11-09 MED ORDER — MIDAZOLAM HCL 5 MG/5ML IJ SOLN
INTRAMUSCULAR | Status: AC
  Filled 2016-11-09: qty 10

## 2016-11-09 MED ORDER — STERILE WATER FOR IRRIGATION IR SOLN
Status: DC | PRN
  Administered 2016-11-09: 11:00:00

## 2016-11-09 MED ORDER — SODIUM CHLORIDE 0.9 % IV SOLN
INTRAVENOUS | Status: DC
  Administered 2016-11-09: 10:00:00 via INTRAVENOUS

## 2016-11-09 MED ORDER — MEPERIDINE HCL 50 MG/ML IJ SOLN
INTRAMUSCULAR | Status: AC
  Filled 2016-11-09: qty 1

## 2016-11-09 MED ORDER — MIDAZOLAM HCL 5 MG/5ML IJ SOLN
INTRAMUSCULAR | Status: DC | PRN
  Administered 2016-11-09 (×3): 1 mg via INTRAVENOUS
  Administered 2016-11-09 (×2): 2 mg via INTRAVENOUS

## 2016-11-09 MED ORDER — MEPERIDINE HCL 50 MG/ML IJ SOLN
INTRAMUSCULAR | Status: DC | PRN
  Administered 2016-11-09 (×2): 25 mg via INTRAVENOUS

## 2016-11-09 NOTE — Discharge Instructions (Signed)
° °  Colon Polyps Polyps are tissue growths inside the body. Polyps can grow in many places, including the large intestine (colon). A polyp may be a round bump or a mushroom-shaped growth. You could have one polyp or several. Most colon polyps are noncancerous (benign). However, some colon polyps can become cancerous over time. What are the causes? The exact cause of colon polyps is not known. What increases the risk? This condition is more likely to develop in people who:  Have a family history of colon cancer or colon polyps.  Are older than 48 or older than 45 if they are African American.  Have inflammatory bowel disease, such as ulcerative colitis or Crohn disease.  Are overweight.  Smoke cigarettes.  Do not get enough exercise.  Drink too much alcohol.  Eat a diet that is: ? High in fat and red meat. ? Low in fiber.  Had childhood cancer that was treated with abdominal radiation.  What are the signs or symptoms? Most polyps do not cause symptoms. If you have symptoms, they may include:  Blood coming from your rectum when having a bowel movement.  Blood in your stool.The stool may look dark red or black.  A change in bowel habits, such as constipation or diarrhea.  How is this diagnosed? This condition is diagnosed with a colonoscopy. This is a procedure that uses a lighted, flexible scope to look at the inside of your colon. How is this treated? Treatment for this condition involves removing any polyps that are found. Those polyps will then be tested for cancer. If cancer is found, your health care provider will talk to you about options for colon cancer treatment. Follow these instructions at home: Diet  Eat plenty of fiber, such as fruits, vegetables, and whole grains.  Eat foods that are high in calcium and vitamin D, such as milk, cheese, yogurt, eggs, liver, fish, and broccoli.  Limit foods high in fat, red meats, and processed meats, such as hot dogs,  sausage, bacon, and lunch meats.  Maintain a healthy weight, or lose weight if recommended by your health care provider. General instructions  Do not smoke cigarettes.  Do not drink alcohol excessively.  Keep all follow-up visits as told by your health care provider. This is important. This includes keeping regularly scheduled colonoscopies. Talk to your health care provider about when you need a colonoscopy.  Exercise every day or as told by your health care provider. Contact a health care provider if:  You have new or worsening bleeding during a bowel movement.  You have new or increased blood in your stool.  You have a change in bowel habits.  You unexpectedly lose weight. This information is not intended to replace advice given to you by your health care provider. Make sure you discuss any questions you have with your health care provider. Document Released: 12/22/2003 Document Revised: 09/02/2015 Document Reviewed: 02/15/2015 Elsevier Interactive Patient Education  2018 [employer]. No aspirin or NSAIDs for 72 hours. Resume usual medications and diet as before. No driving for 24 hours. Physician will call with biopsy results.

## 2016-11-09 NOTE — Op Note (Addendum)
[facility] Patient Name: Hannah Roy Procedure Date: 11/09/2016 10:24 AM MRN: [id number] Date of Birth: 1940-11-25 Attending MD: Hildred Laser , MD CSN: [id number] Age: 76 Admit Type: Outpatient Procedure:                Colonoscopy Indications:              High risk colon cancer surveillance: Personal                            history of colonic polyps Providers:                Hildred Laser, MD, Otis Peak B. Sharon Seller, RN, Randa Spike, Technician Referring MD:             Elayne Snare. Wolfgang Phoenix, MD Medicines:                Meperidine 50 mg IV, Midazolam 7 mg IV Complications:            No immediate complications. Estimated Blood Loss:     Estimated blood loss was minimal. Procedure:                Pre-Anesthesia Assessment:                           - Prior to the procedure, a History and Physical                            was performed, and patient medications and                            allergies were reviewed. The patient's tolerance of                            previous anesthesia was also reviewed. The risks                            and benefits of the procedure and the sedation                            options and risks were discussed with the patient.                            All questions were answered, and informed consent                            was obtained. Prior Anticoagulants: The patient has                            taken no previous anticoagulant or antiplatelet                            agents. ASA Grade Assessment: II - A patient with  mild systemic disease. After reviewing the risks                            and benefits, the patient was deemed in                            satisfactory condition to undergo the procedure.                           After obtaining informed consent, the colonoscope                            was passed under direct vision. Throughout the    procedure, the patient's blood pressure, pulse, and                            oxygen saturations were monitored continuously. The                            was introduced through the anus and advanced to the                            the cecum, identified by appendiceal orifice and                            ileocecal valve. The colonoscopy was technically                            difficult and complex due to significant looping                            and a tortuous colon. Successful completion of the                            procedure was aided by increasing the dose of                            sedation medication, changing the patient's                            position, using manual pressure, withdrawing and                            reinserting the scope and withdrawing the scope and                            replacing with the 'babyscope'. The patient                            tolerated the procedure well. The quality of the                            bowel preparation was good. The ileocecal valve,  appendiceal orifice, and rectum were photographed.                            The EC-[id number] ([id number]) scope was introduced                            through the and advanced to the. Scope In: 10:44:41 AM Scope Out: 11:40:44 AM Scope Withdrawal Time: 0 hours 9 minutes 24 seconds  Total Procedure Duration: 0 hours 56 minutes 3 seconds  Findings:      The perianal and digital rectal examinations were normal.      Two semi-pedunculated polyps were found in the distal sigmoid colon and       cecum. The polyps were 5 to 6 mm in size. The pathology specimen was       placed into Bottle Number 1. The pathology specimen was placed into       Bottle Number 1.      The exam was otherwise without abnormality.      External hemorrhoids were found during retroflexion. The hemorrhoids       were small. Impression:               - Two 5 to 6 mm polyps in  the distal sigmoid colon                            and in the cecum.                           - The examination was otherwise normal.                           - External hemorrhoids.                           - No specimens collected. Moderate Sedation:      Moderate (conscious) sedation was administered by the endoscopy nurse       and supervised by the endoscopist. The following parameters were       monitored: oxygen saturation, heart rate, blood pressure, CO2       capnography and response to care. Total physician intraservice time was       60 minutes. Recommendation:           - Patient has a contact number available for                            emergencies. The signs and symptoms of potential                            delayed complications were discussed with the                            patient. Return to normal activities tomorrow.                            Written discharge instructions were provided to the  patient.                           - Resume previous diet today.                           - Continue present medications.                           - No aspirin, ibuprofen, naproxen, or other                            non-steroidal anti-inflammatory drugs for 3 days.                           - Await pathology results.                           - Repeat colonoscopy in 5 years for surveillance                            under under fluoroscopy. Procedure Code(s):        --- Professional ---                           [phone], Colonoscopy, flexible; diagnostic, including                            collection of specimen(s) by brushing or washing,                            when performed (separate procedure)                           99152, Moderate sedation services provided by the                            same physician or other qualified health care                            professional performing the diagnostic or                             therapeutic service that the sedation supports,                            requiring the presence of an independent trained                            observer to assist in the monitoring of the                            patient's level of consciousness and physiological                            status; initial 15 minutes of intraservice time,  patient age 31 years or older                           [phone], Moderate sedation services; each additional                            15 minutes intraservice time                           [phone], Moderate sedation services; each additional                            15 minutes intraservice time                           99153, Moderate sedation services; each additional                            15 minutes intraservice time Diagnosis Code(s):        --- Professional ---                           Z86.010, Personal history of colonic polyps                           D12.5, Benign neoplasm of sigmoid colon                           D12.0, Benign neoplasm of cecum                           K64.4, Residual hemorrhoidal skin tags CPT copyright 2016 [employer]. All rights reserved. The codes documented in this report are preliminary and upon coder review may  be revised to meet current compliance requirements. Hildred Laser, MD Hildred Laser, MD 11/09/2016 11:49:58 AM This report has been signed electronically. Number of Addenda: 0

## 2016-11-09 NOTE — H&P (Signed)
Hannah Roy is an 76 y.o. female.   Chief Complaint: Patient is here for colonoscopy. HPI: Patient is 76 year old female with history of colonic adenomas and is in for surveillance colonoscopy. She had a tubulovillous adenoma with high-grade dysplasia removed in 2007. Last exam was in December 2012 with removal of 4 small polyps and 3 were tubular adenomas. She denies abdominal pain change in bowel habits or rectal bleeding. Family history is negative for CRC.  Past Medical History:  Diagnosis Date  . Colon polyps   . History of seasonal allergies   . Hypertension   . Osteopenia 2006    Past Surgical History:  Procedure Laterality Date  . APPENDECTOMY    . COLONOSCOPY  03/23/2011   repeat in 5 years,Procedure: COLONOSCOPY;  Surgeon: Rogene Houston, MD;  Location: [facility];  Service: Endoscopy;  Laterality: N/A;  1:00  . TUBAL LIGATION      Family History  Problem Relation Age of Onset  . Colon cancer Daughter    Social History:  reports that she has quit smoking. She has a 5.00 pack-year smoking history. She has never used smokeless tobacco. She reports that she does not drink alcohol or use drugs.  Allergies:  Allergies  Allergen Reactions  . Bee Venom Anaphylaxis  . Boniva [Ibandronic Acid]     Upset stomach, flu like symptoms   . Lisinopril Cough  . Latex Rash    Medications Prior to Admission  Medication Sig Dispense Refill  . acetaminophen (TYLENOL) 500 MG tablet Take 1,000 mg by mouth 2 (two) times daily as needed for moderate pain or headache.     [name] CALCIUM-MAGNESIUM PO Take 2 tablets by mouth daily.    . Cholecalciferol (VITAMIN D) 2000 units CAPS Take 2,000 Units by mouth daily.    . Coenzyme Q10 (COQ-10) 200 MG CAPS Take 200 mg by mouth daily.    . Flaxseed, Linseed, (FLAX SEED OIL) 1000 MG CAPS Take 1,000 mg by mouth daily.    [name] levocetirizine (XYZAL) 5 MG tablet Take 5 mg by mouth daily as needed for allergies.    . Melatonin 5 MG TABS Take 5 mg by  mouth at bedtime as needed (sleep).    . verapamil (CALAN-SR) 180 MG CR tablet TAKE 1 TABLET EVERY DAY (Patient taking differently: TAKE 1 TABLET EVERY DAY AT NIGHT) 90 tablet 1  . vitamin B-12 (CYANOCOBALAMIN) 1000 MCG tablet Take 1,000 mcg by mouth daily.      No results found for this or any previous visit (from the past 48 hour(s)). No results found.  ROS  Blood pressure (!) 142/75, pulse 77, temperature 98.3 F (36.8 C), temperature source Oral, resp. rate 14, height 5\' 1"  (1.549 m), weight 125 lb (56.7 kg), SpO2 100 %. Physical Exam  Constitutional: She appears well-developed and well-nourished.  HENT:  Mouth/Throat: Oropharynx is clear and moist.  Eyes: Conjunctivae are normal. No scleral icterus.  Neck: No thyromegaly present.  Cardiovascular: Normal rate and regular rhythm.   Murmur (faint systolic ejection murmur best heard at left sternal border.) heard. Respiratory: Effort normal and breath sounds normal.  GI: Soft. She exhibits no distension and no mass. There is no tenderness.  Musculoskeletal: She exhibits no edema.  Lymphadenopathy:    She has no cervical adenopathy.  Neurological: She is alert.  Skin: Skin is warm and dry.     Assessment/Plan History of colonic adenomas. Surveillance colonoscopy.  Hildred Laser, MD 11/09/2016, 10:34 AM

## 2016-11-13 ENCOUNTER — Encounter (HOSPITAL_COMMUNITY): Payer: Self-pay | Admitting: Internal Medicine

## 2017-01-03 ENCOUNTER — Other Ambulatory Visit: Payer: Self-pay | Admitting: Family Medicine

## 2017-01-03 DIAGNOSIS — Z1231 Encounter for screening mammogram for malignant neoplasm of breast: Secondary | ICD-10-CM

## 2017-01-24 ENCOUNTER — Encounter: Payer: Self-pay | Admitting: Family Medicine

## 2017-01-24 ENCOUNTER — Ambulatory Visit (INDEPENDENT_AMBULATORY_CARE_PROVIDER_SITE_OTHER): Payer: Medicare Other | Admitting: Family Medicine

## 2017-01-24 ENCOUNTER — Telehealth: Payer: Self-pay

## 2017-01-24 VITALS — BP 112/64 | Ht 61.0 in | Wt 128.0 lb

## 2017-01-24 DIAGNOSIS — M25541 Pain in joints of right hand: Secondary | ICD-10-CM

## 2017-01-24 DIAGNOSIS — E7849 Other hyperlipidemia: Secondary | ICD-10-CM

## 2017-01-24 DIAGNOSIS — I10 Essential (primary) hypertension: Secondary | ICD-10-CM

## 2017-01-24 DIAGNOSIS — R5383 Other fatigue: Secondary | ICD-10-CM | POA: Diagnosis not present

## 2017-01-24 DIAGNOSIS — M25542 Pain in joints of left hand: Secondary | ICD-10-CM

## 2017-01-24 DIAGNOSIS — Z79899 Other long term (current) drug therapy: Secondary | ICD-10-CM

## 2017-01-24 DIAGNOSIS — Z23 Encounter for immunization: Secondary | ICD-10-CM

## 2017-01-24 DIAGNOSIS — I499 Cardiac arrhythmia, unspecified: Secondary | ICD-10-CM

## 2017-01-24 DIAGNOSIS — Z780 Asymptomatic menopausal state: Secondary | ICD-10-CM | POA: Diagnosis not present

## 2017-01-24 NOTE — Telephone Encounter (Signed)
Patient is schedule at [facility] for [facility] on Oct 25 at 9 am, Dr.Scott wanted me to set up Bone Density for the same day. I have this scheduled for 02/01/2017 at 9:30. I have called the pt and left a vm asked that she r/c so I can make her aware of the appointment.CS 01/24/2017

## 2017-01-24 NOTE — Progress Notes (Signed)
Subjective:     Patient ID: Hannah Roy, female   DOB: 06/17/1940, 76 y.o.   MRN: [id number]  Hypertension   Patient here today to follow up on her HTN.  She says she doesn't know how Bps are doing as she has not checked it lately.She is currently Verapamil 180 mg one daily. She tries to eat healthy and she walks five days a week. She does not see any specialist. She is not sleeping well. Patient does take calcium supplement She does use her allergy medicine when necessary She does take her blood pressure medicine Recently had colonoscopy she was told by the gastroenterologist her heart was irregular She does relate some fatigue and tiredness intermittently She also relates significant arthralgias of the hand with some joint changes Some stiffness in the morning time to which seems to get better with time PMH benign  Review of Systems Denies headaches if call to swallowing denies wheezing difficulty breathing denies chest tightness pressure pain denies palpitations denies extremity edema she does relate a lot of joint pains and discomfort worse in the morning time Denies abdominal pain nausea or vomiting. 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 Await the results of the lipid watch diet closely EKG looks normal there is absolutely no changes in no irregularity the indication was irregular heartbeat compared to previous EKG no changes Fatigue tiredness we'll check CBC Arthralgias  of the hands we'll check rheumatoid factor and sedimentation rate  25 minutes was spent with the patient. Greater than half the time was spent in discussion and answering questions and counseling regarding the issues that the patient came in for today. Follow-up 6 months Bone density Await the results of everything

## 2017-01-25 ENCOUNTER — Encounter: Payer: Self-pay | Admitting: Family Medicine

## 2017-01-25 LAB — CBC WITH DIFFERENTIAL/PLATELET
Basophils Absolute: 0 10*3/uL (ref 0.0–0.2)
Basos: 0 %
EOS (ABSOLUTE): 0.2 10*3/uL (ref 0.0–0.4)
Eos: 3 %
Hematocrit: 38.4 % (ref 34.0–46.6)
Hemoglobin: 12.4 g/dL (ref 11.1–15.9)
Immature Grans (Abs): 0 10*3/uL (ref 0.0–0.1)
Immature Granulocytes: 0 %
Lymphocytes Absolute: 1.9 10*3/uL (ref 0.7–3.1)
Lymphs: 36 %
MCH: 30.8 pg (ref 26.6–33.0)
MCHC: 32.3 g/dL (ref 31.5–35.7)
MCV: 95 fL (ref 79–97)
Monocytes Absolute: 0.4 10*3/uL (ref 0.1–0.9)
Monocytes: 8 %
Neutrophils Absolute: 2.7 10*3/uL (ref 1.4–7.0)
Neutrophils: 53 %
Platelets: 231 10*3/uL (ref 150–379)
RBC: 4.03 x10E6/uL (ref 3.77–5.28)
RDW: 13.1 % (ref 12.3–15.4)
WBC: 5.2 10*3/uL (ref 3.4–10.8)

## 2017-01-25 LAB — HEPATIC FUNCTION PANEL
ALT: 7 IU/L (ref 0–32)
AST: 12 IU/L (ref 0–40)
Albumin: 4.8 g/dL (ref 3.5–4.8)
Alkaline Phosphatase: 54 IU/L (ref 39–117)
Bilirubin Total: 0.8 mg/dL (ref 0.0–1.2)
Bilirubin, Direct: 0.19 mg/dL (ref 0.00–0.40)
Total Protein: 7.4 g/dL (ref 6.0–8.5)

## 2017-01-25 LAB — RHEUMATOID FACTOR: Rhuematoid fact SerPl-aCnc: <10 IU/mL (ref 0.0–13.9)

## 2017-01-25 LAB — BASIC METABOLIC PANEL
BUN/Creatinine Ratio: 19 (ref 12–28)
BUN: 14 mg/dL (ref 8–27)
CO2: 24 mmol/L (ref 20–29)
Calcium: 9.3 mg/dL (ref 8.7–10.3)
Chloride: 103 mmol/L (ref 96–106)
Creatinine, Ser: 0.75 mg/dL (ref 0.57–1.00)
GFR calc Af Amer: 91 mL/min/{1.73_m2} (ref >59)
GFR calc non Af Amer: 79 mL/min/{1.73_m2} (ref >59)
Glucose: 85 mg/dL (ref 65–99)
Potassium: 4.3 mmol/L (ref 3.5–5.2)
Sodium: 141 mmol/L (ref 134–144)

## 2017-01-25 LAB — LIPID PANEL
Chol/HDL Ratio: 3.2 ratio (ref 0.0–4.4)
Cholesterol, Total: 196 mg/dL (ref 100–199)
HDL: 62 mg/dL (ref >39)
LDL Calculated: 116 mg/dL — ABNORMAL HIGH (ref 0–99)
Triglycerides: 92 mg/dL (ref 0–149)
VLDL Cholesterol Cal: 18 mg/dL (ref 5–40)

## 2017-01-25 LAB — SEDIMENTATION RATE: Sed Rate: 5 mm/hr (ref 0–40)

## 2017-01-25 NOTE — Telephone Encounter (Signed)
I spoke with the pt and she is aware of all.

## 2017-01-31 ENCOUNTER — Ambulatory Visit: Payer: Medicare Other | Admitting: Family Medicine

## 2017-02-01 ENCOUNTER — Ambulatory Visit (HOSPITAL_COMMUNITY)
Admission: RE | Admit: 2017-02-01 | Discharge: 2017-02-01 | Disposition: A | Discharge status: Home / Self Care | Payer: Medicare Other | Source: Ambulatory Visit | Attending: Family Medicine | Admitting: Family Medicine

## 2017-02-01 ENCOUNTER — Encounter (HOSPITAL_COMMUNITY): Payer: Self-pay

## 2017-02-01 DIAGNOSIS — Z780 Asymptomatic menopausal state: Secondary | ICD-10-CM | POA: Diagnosis not present

## 2017-02-01 DIAGNOSIS — M8580 Other specified disorders of bone density and structure, unspecified site: Secondary | ICD-10-CM | POA: Insufficient documentation

## 2017-02-01 DIAGNOSIS — Z13820 Encounter for screening for osteoporosis: Secondary | ICD-10-CM | POA: Diagnosis not present

## 2017-02-01 DIAGNOSIS — Z1231 Encounter for screening mammogram for malignant neoplasm of breast: Secondary | ICD-10-CM | POA: Diagnosis not present

## 2017-02-01 DIAGNOSIS — M8589 Other specified disorders of bone density and structure, multiple sites: Secondary | ICD-10-CM | POA: Diagnosis not present

## 2017-02-04 ENCOUNTER — Encounter: Payer: Self-pay | Admitting: Family Medicine

## 2017-03-08 ENCOUNTER — Telehealth: Payer: Self-pay | Admitting: Family Medicine

## 2017-03-08 MED ORDER — VERAPAMIL HCL ER 180 MG PO TBCR: 180.0000 mg | EXTENDED_RELEASE_TABLET | Freq: Every day | ORAL | 1 refills | Status: DC

## 2017-03-08 NOTE — Telephone Encounter (Signed)
Patient wanting to know if she should continue blood pressure medication if so she needs refill for 90 day supply mail in order for verapamil 180 mg

## 2017-03-08 NOTE — Telephone Encounter (Signed)
May have 90-day with 1 refill follow-up in the spring

## 2017-03-08 NOTE — Telephone Encounter (Signed)
Spoke with patient and informed her per Dr.Scott Luking- we are sending in refills on your medication. We are sending in 90 day supply. Informed patient to schedule office visit in the spring.

## 2017-03-08 NOTE — Telephone Encounter (Signed)
Last seen oct for bp check up

## 2017-04-21 ENCOUNTER — Emergency Department (HOSPITAL_COMMUNITY): Payer: Medicare Other

## 2017-04-21 ENCOUNTER — Encounter (HOSPITAL_COMMUNITY): Payer: Self-pay

## 2017-04-21 ENCOUNTER — Other Ambulatory Visit: Payer: Self-pay

## 2017-04-21 ENCOUNTER — Emergency Department (HOSPITAL_COMMUNITY)
Admission: EM | Admit: 2017-04-21 | Discharge: 2017-04-21 | Disposition: A | Discharge status: Home / Self Care | Payer: Medicare Other | Attending: Emergency Medicine | Admitting: Emergency Medicine

## 2017-04-21 DIAGNOSIS — H8301 Labyrinthitis, right ear: Secondary | ICD-10-CM

## 2017-04-21 DIAGNOSIS — Z79899 Other long term (current) drug therapy: Secondary | ICD-10-CM | POA: Diagnosis not present

## 2017-04-21 DIAGNOSIS — R42 Dizziness and giddiness: Secondary | ICD-10-CM | POA: Diagnosis not present

## 2017-04-21 DIAGNOSIS — Z91040 Latex allergy status: Secondary | ICD-10-CM | POA: Diagnosis not present

## 2017-04-21 DIAGNOSIS — I10 Essential (primary) hypertension: Secondary | ICD-10-CM | POA: Diagnosis not present

## 2017-04-21 DIAGNOSIS — H6121 Impacted cerumen, right ear: Secondary | ICD-10-CM | POA: Diagnosis not present

## 2017-04-21 DIAGNOSIS — Z87891 Personal history of nicotine dependence: Secondary | ICD-10-CM | POA: Diagnosis not present

## 2017-04-21 DIAGNOSIS — I639 Cerebral infarction, unspecified: Secondary | ICD-10-CM | POA: Diagnosis not present

## 2017-04-21 DIAGNOSIS — R55 Syncope and collapse: Secondary | ICD-10-CM | POA: Diagnosis present

## 2017-04-21 LAB — COMPREHENSIVE METABOLIC PANEL
ALT: 10 U/L — ABNORMAL LOW (ref 14–54)
AST: 13 U/L — ABNORMAL LOW (ref 15–41)
Albumin: 4.1 g/dL (ref 3.5–5.0)
Alkaline Phosphatase: 51 U/L (ref 38–126)
Anion gap: 10 (ref 5–15)
BUN: 18 mg/dL (ref 6–20)
CO2: 25 mmol/L (ref 22–32)
Calcium: 9.1 mg/dL (ref 8.9–10.3)
Chloride: 102 mmol/L (ref 101–111)
Creatinine, Ser: 0.66 mg/dL (ref 0.44–1.00)
GFR calc Af Amer: >60 mL/min (ref >60)
GFR calc non Af Amer: >60 mL/min (ref >60)
Glucose, Bld: 106 mg/dL — ABNORMAL HIGH (ref 65–99)
Potassium: 4 mmol/L (ref 3.5–5.1)
Sodium: 137 mmol/L (ref 135–145)
Total Bilirubin: 0.7 mg/dL (ref 0.3–1.2)
Total Protein: 7.2 g/dL (ref 6.5–8.1)

## 2017-04-21 LAB — CBC WITH DIFFERENTIAL/PLATELET
Basophils Absolute: 0 10*3/uL (ref 0.0–0.1)
Basophils Relative: 0 %
Eosinophils Absolute: 0.1 10*3/uL (ref 0.0–0.7)
Eosinophils Relative: 2 %
HCT: 36.2 % (ref 36.0–46.0)
Hemoglobin: 11.8 g/dL — ABNORMAL LOW (ref 12.0–15.0)
Lymphocytes Relative: 20 %
Lymphs Abs: 1.1 10*3/uL (ref 0.7–4.0)
MCH: 31 pg (ref 26.0–34.0)
MCHC: 32.6 g/dL (ref 30.0–36.0)
MCV: 95 fL (ref 78.0–100.0)
Monocytes Absolute: 0.3 10*3/uL (ref 0.1–1.0)
Monocytes Relative: 6 %
Neutro Abs: 4 10*3/uL (ref 1.7–7.7)
Neutrophils Relative %: 72 %
Platelets: 221 10*3/uL (ref 150–400)
RBC: 3.81 MIL/uL — ABNORMAL LOW (ref 3.87–5.11)
RDW: 12.4 % (ref 11.5–15.5)
WBC: 5.5 10*3/uL (ref 4.0–10.5)

## 2017-04-21 LAB — URINALYSIS, ROUTINE W REFLEX MICROSCOPIC
Bilirubin Urine: NEGATIVE
Glucose, UA: NEGATIVE mg/dL
Hgb urine dipstick: NEGATIVE
Ketones, ur: NEGATIVE mg/dL
Leukocytes, UA: NEGATIVE
Nitrite: NEGATIVE
Protein, ur: NEGATIVE mg/dL
Specific Gravity, Urine: 1.005 (ref 1.005–1.030)
pH: 7 (ref 5.0–8.0)

## 2017-04-21 LAB — CBG MONITORING, ED: Glucose-Capillary: 104 mg/dL — ABNORMAL HIGH (ref 65–99)

## 2017-04-21 LAB — ETHANOL: Alcohol, Ethyl (B): <10 mg/dL (ref <10)

## 2017-04-21 MED ORDER — MECLIZINE HCL 12.5 MG PO TABS: 12.5000 mg | ORAL_TABLET | Freq: Three times a day (TID) | ORAL | 0 refills | Status: DC | PRN

## 2017-04-21 NOTE — ED Provider Notes (Addendum)
[facility] EMERGENCY DEPARTMENT Provider Note   CSN: [id number] Arrival date & time: 04/21/17  1100     History   Chief Complaint Chief Complaint  Patient presents with  . Near Syncope    HPI Hannah Roy is a 77 y.o. female.  Patient reports having an episode of lightheadedness described as a spinning sensation causing her to sit down associated with posterior headache, between 7 and 8 AM this morning.  Symptoms resolved spontaneously and have not recurred.  She also feels "fatigued," today.  She ate breakfast prior to the onset of symptoms this morning as usual.  He came here by private vehicle.  She denies chest pain, back pain, nausea, vomiting, dysuria or constipation.  She had a root canal done about 10 days ago, and for the last week or so she has had decreased hearing, in the right ear.  Her dental pain is improving.  There are no other known modifying factors.  HPI  Past Medical History:  Diagnosis Date  . Colon polyps   . History of seasonal allergies   . Hypertension   . Osteopenia 2006    Patient Active Problem List   Diagnosis Date Noted  . History of colonic polyps 08/03/2016  . Osteoarthritis of right knee 10/17/2013  . Essential hypertension, benign 07/30/2012  . Allergic rhinitis 07/30/2012  . 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 Osteopenia 07/30/2012    Past Surgical History:  Procedure Laterality Date  . APPENDECTOMY    . COLONOSCOPY  03/23/2011   repeat in 5 years,Procedure: COLONOSCOPY;  Surgeon: Rogene Houston, MD;  Location: [facility];  Service: Endoscopy;  Laterality: N/A;  1:00  . COLONOSCOPY N/A 11/09/2016   Procedure: COLONOSCOPY;  Surgeon: Rogene Houston, MD;  Location: [facility];  Service: Endoscopy;  Laterality: N/A;  1030  . TUBAL LIGATION      OB History    No data available       Home Medications    Prior to Admission medications   Medication Sig Start Date End Date Taking? Authorizing Provider  CALCIUM-MAGNESIUM  PO Take 2 tablets by mouth daily.   Yes [provider]  Cholecalciferol (VITAMIN D) 2000 units CAPS Take 2,000 Units by mouth daily.   Yes [provider]  Coenzyme Q10 (COQ-10) 200 MG CAPS Take 200 mg by mouth daily.   Yes [provider]  Flaxseed, Linseed, (FLAX SEED OIL) 1000 MG CAPS Take 1,000 mg by mouth daily.   Yes [provider]  ibuprofen (ADVIL,MOTRIN) 800 MG tablet Take 800 mg by mouth every 8 (eight) hours as needed for moderate pain.   Yes [provider]  levocetirizine (XYZAL) 5 MG tablet Take 5 mg by mouth daily as needed for allergies.   Yes [provider]  Melatonin 5 MG TABS Take 5 mg by mouth at bedtime as needed (sleep).   Yes [provider]  verapamil (CALAN-SR) 180 MG CR tablet Take 1 tablet (180 mg total) by mouth daily. 03/08/17  Yes Luking, Elayne Snare, MD  vitamin B-12 (CYANOCOBALAMIN) 1000 MCG tablet Take 1,000 mcg by mouth daily.   Yes [provider]  clindamycin (CLEOCIN) 150 MG capsule Take 1 capsule by mouth 3 (three) times daily. 04/13/17   [provider]  meclizine (ANTIVERT) 12.5 MG tablet Take 1 tablet (12.5 mg total) by mouth 3 (three) times daily as needed for dizziness. 04/21/17   Daleen Bo, MD    Family History Family History  Problem Relation Age of Onset  . Colon cancer Daughter     Social History Social History   Tobacco Use  . Smoking status: Former Smoker    Packs/day: 0.50    Years: 10.00    Pack years: 5.00  . Smokeless tobacco: Never Used  Substance Use Topics  . Alcohol use: No  . Drug use: No     Allergies   Bee venom; Boniva [ibandronic acid]; Lisinopril; and Latex   Review of Systems Review of Systems  All other systems reviewed and are negative.    Physical Exam Updated Vital Signs BP (!) 141/76   Pulse 67   Resp 17   Ht 5\' 1"  (1.549 m)   Wt 58.1 kg (128 lb)   SpO2 95%   BMI 24.19 kg/m   Physical Exam  Constitutional: She is  oriented to person, place, and time. She appears well-developed and well-nourished.  HENT:  Head: Normocephalic and atraumatic.  Right Ear: External ear normal.  Left Ear: External ear normal.  Right external auditory canal occluded with wax.  Left EAC and TM are normal. No trismus.  Eyes: Conjunctivae and EOM are normal. Pupils are equal, round, and reactive to light.  Neck: Normal range of motion and phonation normal. Neck supple.  Cardiovascular: Normal rate and regular rhythm.  Pulmonary/Chest: Effort normal and breath sounds normal. She exhibits no tenderness.  Abdominal: Soft. She exhibits no distension. There is no tenderness. There is no guarding.  Musculoskeletal: Normal range of motion.  Neurological: She is alert and oriented to person, place, and time. She exhibits normal muscle tone.  No dysarthria, aphasia or nystagmus.  No dysmetria.  Normal heel to shin, bilaterally.  Skin: Skin is warm and dry.  Psychiatric: She has a normal mood and affect. Her behavior is normal. Judgment and thought content normal.  Nursing note and vitals reviewed.    ED Treatments / Results  Labs (all labs ordered are listed, but only abnormal results are displayed) Labs Reviewed  CBC WITH DIFFERENTIAL/PLATELET - Abnormal; Notable for the following components:      Result Value   RBC 3.81 (*)    Hemoglobin 11.8 (*)    All other components within normal limits  URINALYSIS, ROUTINE W REFLEX MICROSCOPIC - Abnormal; Notable for the following components:   Color, Urine STRAW (*)    All other components within normal limits  COMPREHENSIVE METABOLIC PANEL - Abnormal; Notable for the following components:   Glucose, Bld 106 (*)    AST 13 (*)    ALT 10 (*)    All other components within normal limits  CBG MONITORING, ED - Abnormal; Notable for the following components:   Glucose-Capillary 104 (*)    All other components within normal limits  ETHANOL    EKG  EKG  Interpretation  Date/Time:  Saturday April 21 2017 11:23:12 EST Ventricular Rate:  65 PR Interval:    QRS Duration: 99 QT Interval:  433 QTC Calculation: 451 R Axis:   29 Text Interpretation:  Sinus rhythm Borderline prolonged PR interval Probable left atrial enlargement Probable anteroseptal infarct, old Minimal ST depression, lateral leads Baseline wander in lead(s) III aVF Since last tracing PR interval is longer Confirmed by Daleen Bo [phone]) on 04/21/2017 11:29:58 AM       Radiology Ct Head Wo Contrast  Result Date: 04/21/2017 CLINICAL DATA:  TIA EXAM: CT HEAD WITHOUT CONTRAST TECHNIQUE: Contiguous axial images were obtained from the base of the skull through the vertex without  intravenous contrast. COMPARISON:  None. FINDINGS: Brain: No acute intracranial abnormality. Specifically, no hemorrhage, hydrocephalus, mass lesion, acute infarction, or significant intracranial injury. Vascular: No hyperdense vessel or unexpected calcification. Skull: No acute calvarial abnormality. Sinuses/Orbits: Visualized paranasal sinuses and mastoids clear. Orbital soft tissues unremarkable. Other: None IMPRESSION: No acute intracranial abnormality. Electronically Signed   By: Rolm Baptise M.D.   On: 04/21/2017 14:43    Procedures Procedures (including critical care time)  Medications Ordered in ED Medications - No data to display   Initial Impression / Assessment and Plan / ED Course  I have reviewed the triage vital signs and the nursing notes.  Pertinent labs & imaging results that were available during my care of the patient were reviewed by me and considered in my medical decision making (see chart for details).     Patient Vitals for the past 24 hrs:  BP Pulse Resp SpO2 Height Weight  04/21/17 1500 (!) 141/76 67 17 95 % - -  04/21/17 1430 (!) 143/76 72 17 98 % - -  04/21/17 1330 (!) 142/74 78 16 93 % - -  04/21/17 1115 - - - - 5\' 1"  (1.549 m) 58.1 kg (128 lb)    3:22 PM  Reevaluation with update and discussion. After initial assessment and treatment, an updated evaluation reveals no change in clinical status.  She has been able to ambulate easily here.  No additional complaints.Daleen Bo      Final Clinical Impressions(s) / ED Diagnoses   Final diagnoses:  Vertigo  Labyrinthitis of right ear  Impacted cerumen of right ear      Nursing Notes Reviewed/ Care Coordinated Applicable Imaging Reviewed Interpretation of Laboratory Data incorporated into ED treatment  The patient appears reasonably screened and/or stabilized for discharge and I doubt any other medical condition or other [facility] requiring further screening, evaluation, or treatment in the ED at this time prior to discharge.  Plan: Home Medications- continue usual meds; Home Treatments- irrigate right ear for wax; return here if the recommended treatment, does not improve the symptoms; Recommended follow up- PCP f/u 4/5 days.    ED Discharge Orders        Ordered    meclizine (ANTIVERT) 12.5 MG tablet  3 times daily PRN     04/21/17 1543       Daleen Bo, MD 04/21/17 1542    Daleen Bo, MD 04/21/17 [phone]

## 2017-04-21 NOTE — ED Triage Notes (Signed)
Patient reports of waking around in kitchen this morning felt like she was going to pass out but didnt. Complains of headache. No deficits noted.  A&Ox4.

## 2017-04-21 NOTE — Discharge Instructions (Signed)
Your dizziness is likely from an inner ear problem, vertigo.  It is important to get plenty of rest and drink a lot of fluids.  We are prescribing a mild antihistamine to help your symptoms.  Careful, because it can make you sleepy.  Your symptoms should tend to improve but it is important to follow-up with your doctor in 4 days or so, to make sure you are doing well.  For the wax in the right ear, rinse well with warm water, several times, 2 or 3 times a day, or use half-strength peroxide with warm water, for the rinse.  Return here, if needed, for problems.

## 2017-04-25 ENCOUNTER — Ambulatory Visit (INDEPENDENT_AMBULATORY_CARE_PROVIDER_SITE_OTHER): Payer: Medicare Other | Admitting: Family Medicine

## 2017-04-25 ENCOUNTER — Encounter: Payer: Self-pay | Admitting: Family Medicine

## 2017-04-25 VITALS — BP 134/82 | Temp 98.6°F | Ht 61.0 in | Wt 125.0 lb

## 2017-04-25 DIAGNOSIS — H8393 Unspecified disease of inner ear, bilateral: Secondary | ICD-10-CM

## 2017-04-25 MED ORDER — MECLIZINE HCL 12.5 MG PO TABS: 12.5000 mg | ORAL_TABLET | Freq: Three times a day (TID) | ORAL | 0 refills | Status: DC | PRN

## 2017-04-25 NOTE — Progress Notes (Signed)
   Subjective:    Patient ID: Hannah Roy, female    DOB: 24-Feb-1941, 77 y.o.   MRN: [id number]  HPIFollow up ED visit for vertigo. Taking meclizine. Pt states it is helping a little.  Patient had onset of dizziness as well as feeling like the room was spinning at home she felt like she was going to pass out she put her head down on the table she eventually had to notify her family member who took her to the ER in the ER they did do a CAT scan as well as lab work she is gradually improved over the past 7 days she states no headache no double vision just relates some dizziness when she moves She denies any unilateral numbness weakness she does have a history of blood pressure she takes medicine regular basis  Review of Systems    Currently right now no nausea vomiting headache fever chills she is able to move her eyes left right Objective:   Physical Exam EOMI is intact no sign of nystagmus eardrums are normal her finger to nose is normal she walks without difficulty no unilateral numbness or weakness       Assessment & Plan:  More than likely inner ear no sign of any type of stroke currently I do not recommend any type of MRI at this point I recommend that the patient be followed up again in 1 week's time if progressive trouble she is to notify us

## 2017-05-02 ENCOUNTER — Encounter: Payer: Self-pay | Admitting: Family Medicine

## 2017-05-02 ENCOUNTER — Ambulatory Visit (INDEPENDENT_AMBULATORY_CARE_PROVIDER_SITE_OTHER): Payer: Medicare Other | Admitting: Family Medicine

## 2017-05-02 VITALS — BP 122/78 | Temp 98.2°F | Ht 61.0 in | Wt 125.8 lb

## 2017-05-02 DIAGNOSIS — R42 Dizziness and giddiness: Secondary | ICD-10-CM

## 2017-05-02 DIAGNOSIS — I10 Essential (primary) hypertension: Secondary | ICD-10-CM | POA: Diagnosis not present

## 2017-05-02 MED ORDER — VALACYCLOVIR HCL 1 G PO TABS: ORAL_TABLET | ORAL | 6 refills | Status: DC

## 2017-05-02 MED ORDER — MECLIZINE HCL 12.5 MG PO TABS: 12.5000 mg | ORAL_TABLET | Freq: Three times a day (TID) | ORAL | 0 refills | Status: DC | PRN

## 2017-05-02 NOTE — Patient Instructions (Signed)
Only if vertigo hits hard then use meclizine

## 2017-05-02 NOTE — Progress Notes (Signed)
   Subjective:    Patient ID: Hannah Roy, female    DOB: 1940/07/17, 77 y.o.   MRN: [id number]  HPI Pt follow up for vertigo. She denies any specific unilateral numbness weakness she denies numbing numbing tingling she denies headache denies chest tightness pressure pain has history of high blood pressure  Review of Systems  Constitutional: Negative for activity change, fatigue and fever.  HENT: Negative for congestion.   Respiratory: Negative for cough, chest tightness and shortness of breath.   Cardiovascular: Negative for chest pain and leg swelling.  Gastrointestinal: Negative for abdominal pain.  Skin: Negative for color change.  Neurological: Negative for headaches.  Psychiatric/Behavioral: Negative for behavioral problems.       Objective:   Physical Exam  Constitutional: She appears well-developed and well-nourished. No distress.  HENT:  Head: Normocephalic and atraumatic.  Eyes: Right eye exhibits no discharge. Left eye exhibits no discharge.  Neck: No tracheal deviation present.  Cardiovascular: Normal rate, regular rhythm and normal heart sounds.  No murmur heard. Pulmonary/Chest: Effort normal and breath sounds normal. No respiratory distress. She has no wheezes. She has no rales.  Musculoskeletal: She exhibits no edema.  Lymphadenopathy:    She has no cervical adenopathy.  Neurological: She is alert. She exhibits normal muscle tone.  Skin: Skin is warm and dry. No erythema.  Psychiatric: Her behavior is normal.  Vitals reviewed.   Finger to nose normal Romberg negative able to walk without difficulty EOMI without nystagmus inner ear/vertigo resolving meclizine only if severe symptoms  Blood pressure good control  Follow-up in April for regular checkup follow-up sooner problems      Assessment & Plan:  See above  Inner ear resolving if it gets worse follow-up may need referral

## 2017-05-03 ENCOUNTER — Other Ambulatory Visit: Payer: Self-pay | Admitting: *Deleted

## 2017-05-10 ENCOUNTER — Telehealth: Payer: Self-pay | Admitting: Family Medicine

## 2017-05-10 MED ORDER — VALACYCLOVIR HCL 1 G PO TABS: ORAL_TABLET | ORAL | 3 refills | Status: DC

## 2017-05-10 NOTE — Telephone Encounter (Signed)
Prescription was resent to pharmacy as requested

## 2017-05-10 NOTE — Telephone Encounter (Signed)
Pt's pharmacy is stating that they have not received the prescription for the valacyclovir that was sent in on the 23 of jan. Can this be resent?

## 2017-05-29 ENCOUNTER — Other Ambulatory Visit: Payer: Self-pay | Admitting: Family Medicine

## 2017-08-03 ENCOUNTER — Ambulatory Visit (INDEPENDENT_AMBULATORY_CARE_PROVIDER_SITE_OTHER): Payer: Medicare Other | Admitting: Family Medicine

## 2017-08-03 ENCOUNTER — Encounter: Payer: Self-pay | Admitting: Family Medicine

## 2017-08-03 VITALS — BP 126/80 | Ht 61.0 in | Wt 128.2 lb

## 2017-08-03 DIAGNOSIS — R270 Ataxia, unspecified: Secondary | ICD-10-CM | POA: Diagnosis not present

## 2017-08-03 DIAGNOSIS — R42 Dizziness and giddiness: Secondary | ICD-10-CM

## 2017-08-03 DIAGNOSIS — E7849 Other hyperlipidemia: Secondary | ICD-10-CM

## 2017-08-03 DIAGNOSIS — I10 Essential (primary) hypertension: Secondary | ICD-10-CM

## 2017-08-03 NOTE — Progress Notes (Signed)
   Subjective:    Patient ID: Hannah Roy, female    DOB: 01-03-1941, 77 y.o.   MRN: [id number]  Hypertension  This is a chronic problem. The current episode started more than 1 year ago. Pertinent negatives include no chest pain, headaches or shortness of breath. Risk factors for coronary artery disease include post-menopausal state. Treatments tried: verapamil. There are no compliance problems.    The patient relates she has been doing a good job watching her diet good job taking her medication she does try to stay active she denies any type of chest tightness pressure pain or shortness of breath  She has been having intermittent dizziness with feeling at times feels like the room is turning other times nausea no double vision denies any unilateral numbness or weakness at times she feels like her balance is not as good as it should be.  So far this is felt to be a inner ear issue but we need to rule out the possibility of carotid artery disease  Patient has had slight elevation of LDL but her HDL has been excellent but certainly if she starts having peripheral vascular disease or carotid artery disease she will need to be on a statin to help lower things she understands this Review of Systems  Constitutional: Negative for activity change, fatigue and fever.  HENT: Negative for congestion.   Respiratory: Negative for cough, chest tightness and shortness of breath.   Cardiovascular: Negative for chest pain and leg swelling.  Gastrointestinal: Negative for abdominal pain.  Skin: Negative for color change.  Neurological: Negative for headaches.  Psychiatric/Behavioral: Negative for behavioral problems.   She denies unilateral numbness weakness she denies severe headaches denies double vision denies wheezing runny nose denies chest pressure tightness pain denies abdominal pain nausea vomiting diarrhea    Objective:   Physical Exam  Constitutional: She appears well-developed and well-nourished.  No distress.  HENT:  Head: Normocephalic and atraumatic.  Eyes: Right eye exhibits no discharge. Left eye exhibits no discharge.  Neck: No tracheal deviation present.  Cardiovascular: Normal rate, regular rhythm and normal heart sounds.  No murmur heard. Pulmonary/Chest: Effort normal and breath sounds normal. No respiratory distress. She has no wheezes. She has no rales.  Musculoskeletal: She exhibits no edema.  Lymphadenopathy:    She has no cervical adenopathy.  Neurological: She is alert. She exhibits normal muscle tone.  Skin: Skin is warm and dry. No erythema.  Psychiatric: Her behavior is normal.  Vitals reviewed.   No carotid bruit      Assessment & Plan:  HTN good control continue current measures  Intermittent dizziness more than likely in her ear but patient is at risk of carotid artery disease because of blood pressure aging cholesterol  Check ultrasound await results  Hyperlipidemia check lab work await results previous labs reviewed  Based upon results may need to go on statin  Follow-up 6 months

## 2017-08-04 LAB — LIPID PANEL
Chol/HDL Ratio: 3 ratio (ref 0.0–4.4)
Cholesterol, Total: 195 mg/dL (ref 100–199)
HDL: 64 mg/dL (ref >39)
LDL Calculated: 110 mg/dL — ABNORMAL HIGH (ref 0–99)
Triglycerides: 107 mg/dL (ref 0–149)
VLDL Cholesterol Cal: 21 mg/dL (ref 5–40)

## 2017-08-04 LAB — BASIC METABOLIC PANEL
BUN/Creatinine Ratio: 12 (ref 12–28)
BUN: 10 mg/dL (ref 8–27)
CO2: 26 mmol/L (ref 20–29)
Calcium: 9.5 mg/dL (ref 8.7–10.3)
Chloride: 105 mmol/L (ref 96–106)
Creatinine, Ser: 0.83 mg/dL (ref 0.57–1.00)
GFR calc Af Amer: 80 mL/min/{1.73_m2} (ref >59)
GFR calc non Af Amer: 70 mL/min/{1.73_m2} (ref >59)
Glucose: 85 mg/dL (ref 65–99)
Potassium: 4.2 mmol/L (ref 3.5–5.2)
Sodium: 143 mmol/L (ref 134–144)

## 2017-08-05 ENCOUNTER — Encounter: Payer: Self-pay | Admitting: Family Medicine

## 2017-08-15 ENCOUNTER — Telehealth: Payer: Self-pay | Admitting: Family Medicine

## 2017-08-15 NOTE — Telephone Encounter (Signed)
FYI   Pt came by the office due to she's not heard back about carotid U/S  This hadn't been scheduled (for some reason it never showed up in my "Q" - had to be a system glitch)  I apologized to Hannah Roy, and got her scheduled for 08/17/17 @ [facility]  She asked that I let Dr. Nicki Reaper know

## 2017-08-15 NOTE — Telephone Encounter (Signed)
Ok await results thanks

## 2017-08-17 ENCOUNTER — Ambulatory Visit (HOSPITAL_COMMUNITY)
Admission: RE | Admit: 2017-08-17 | Discharge: 2017-08-17 | Disposition: A | Discharge status: Home / Self Care | Payer: Medicare Other | Source: Ambulatory Visit | Attending: Family Medicine | Admitting: Family Medicine

## 2017-08-17 DIAGNOSIS — R270 Ataxia, unspecified: Secondary | ICD-10-CM | POA: Diagnosis not present

## 2017-08-17 DIAGNOSIS — E7849 Other hyperlipidemia: Secondary | ICD-10-CM | POA: Diagnosis not present

## 2017-08-17 DIAGNOSIS — I10 Essential (primary) hypertension: Secondary | ICD-10-CM | POA: Insufficient documentation

## 2017-08-17 DIAGNOSIS — I6523 Occlusion and stenosis of bilateral carotid arteries: Secondary | ICD-10-CM | POA: Diagnosis not present

## 2017-09-05 DIAGNOSIS — H524 Presbyopia: Secondary | ICD-10-CM | POA: Diagnosis not present

## 2017-09-05 DIAGNOSIS — H25813 Combined forms of age-related cataract, bilateral: Secondary | ICD-10-CM | POA: Diagnosis not present

## 2017-09-05 DIAGNOSIS — H5213 Myopia, bilateral: Secondary | ICD-10-CM | POA: Diagnosis not present

## 2017-09-05 DIAGNOSIS — H52222 Regular astigmatism, left eye: Secondary | ICD-10-CM | POA: Diagnosis not present

## 2017-10-18 ENCOUNTER — Other Ambulatory Visit: Payer: Self-pay | Admitting: *Deleted

## 2017-10-18 ENCOUNTER — Telehealth: Payer: Self-pay | Admitting: Family Medicine

## 2017-10-18 MED ORDER — MECLIZINE HCL 12.5 MG PO TABS: 12.5000 mg | ORAL_TABLET | Freq: Three times a day (TID) | ORAL | 0 refills | Status: DC | PRN

## 2017-10-18 NOTE — Telephone Encounter (Signed)
Med sent to pharm. Pt notified.  

## 2017-10-18 NOTE — Telephone Encounter (Signed)
Pt states room spinning, having vertigo, no headache. Would like a refill on meclizine.

## 2017-10-18 NOTE — Telephone Encounter (Signed)
Patient woke up this morning dizzy.  She said she has been seen this year for vertigo by Dr. Nicki Reaper.  She wants to know if Rx can be called in because she is unable to come in the office due to transportation issues.  Larene Pickett

## 2017-10-18 NOTE — Telephone Encounter (Signed)
ok may ref

## 2017-10-23 ENCOUNTER — Ambulatory Visit (INDEPENDENT_AMBULATORY_CARE_PROVIDER_SITE_OTHER): Payer: Medicare Other | Admitting: Family Medicine

## 2017-10-23 ENCOUNTER — Encounter: Payer: Self-pay | Admitting: Family Medicine

## 2017-10-23 VITALS — BP 100/78 | Temp 98.9°F | Ht 61.0 in | Wt 122.8 lb

## 2017-10-23 DIAGNOSIS — R270 Ataxia, unspecified: Secondary | ICD-10-CM

## 2017-10-23 DIAGNOSIS — H8149 Vertigo of central origin, unspecified ear: Secondary | ICD-10-CM | POA: Diagnosis not present

## 2017-10-23 DIAGNOSIS — R42 Dizziness and giddiness: Secondary | ICD-10-CM | POA: Diagnosis not present

## 2017-10-23 DIAGNOSIS — R51 Headache: Secondary | ICD-10-CM | POA: Diagnosis not present

## 2017-10-23 DIAGNOSIS — R519 Headache, unspecified: Secondary | ICD-10-CM

## 2017-10-23 DIAGNOSIS — H814 Vertigo of central origin: Secondary | ICD-10-CM

## 2017-10-23 DIAGNOSIS — I10 Essential (primary) hypertension: Secondary | ICD-10-CM | POA: Diagnosis not present

## 2017-10-23 MED ORDER — MECLIZINE HCL 12.5 MG PO TABS: 12.5000 mg | ORAL_TABLET | Freq: Three times a day (TID) | ORAL | 3 refills | Status: DC | PRN

## 2017-10-23 NOTE — Progress Notes (Signed)
   Subjective:    Patient ID: Hannah Roy, female    DOB: Dec 08, 1940, 77 y.o.   MRN: [id number]  Dizziness  This is a recurrent problem. Episode onset: jan 12th. Associated symptoms include headaches. Pertinent negatives include no abdominal pain, chest pain, congestion, coughing, fatigue or weakness. Treatments tried: antivert.   Patient relates several different episodes of severe vertigo where the room is spinning feels nauseous double vision does not feel been unable to walk around the last somewhere between 1 and 3 days she had a severe spell in January of severe spell last week and has had a few intermittent spells  She is also had some intermittent spells of headaches that happen during the day and sometimes late in the evening and has had some ataxia associated with weakness in addition to this also states at times her vision seems very or double   Review of Systems  Constitutional: Negative for activity change, appetite change and fatigue.  HENT: Negative for congestion and rhinorrhea.   Respiratory: Negative for cough and shortness of breath.   Cardiovascular: Negative for chest pain and leg swelling.  Gastrointestinal: Negative for abdominal pain and constipation.  Endocrine: Negative for polydipsia and polyphagia.  Skin: Negative for color change.  Neurological: Positive for dizziness and headaches. Negative for weakness.  Psychiatric/Behavioral: Negative for confusion.       Objective:   Physical Exam  Constitutional: She appears well-nourished. No distress.  HENT:  Head: Normocephalic and atraumatic.  Eyes: Right eye exhibits no discharge. Left eye exhibits no discharge.  Neck: No tracheal deviation present.  Cardiovascular: Normal rate, regular rhythm and normal heart sounds.  No murmur heard. Pulmonary/Chest: Effort normal and breath sounds normal. No respiratory distress.  Musculoskeletal: She exhibits no edema.  Lymphadenopathy:    She has no cervical adenopathy.   Neurological: She is alert. She exhibits normal muscle tone.  Psychiatric: Her behavior is normal.  Vitals reviewed.  Finger-to-nose normal Romberg negative visual fields good walking in the hallway turning around good       Assessment & Plan:  Frequent frontal headaches along with dizziness she has had several episodes of severe vertigo along with intermittent spells of mild dizziness I would recommend she be seen by ENT for further evaluation of frequent vertigo I also recommend that we do a MRI to rule out the possibility of stroke as well as to rule out the possibility of a vestibular growth causing her dizziness and vertigo  25 minutes was spent with the patient.  This statement verifies that 25 minutes was indeed spent with the patient.  More than 50% of this visit-total duration of the visit-was spent in counseling and coordination of care. The issues that the patient came in for today as reflected in the diagnosis (s) please refer to documentation for further details. Sooner if needed follow-up in 8 weeks

## 2017-10-29 ENCOUNTER — Encounter: Payer: Self-pay | Admitting: Family Medicine

## 2017-10-30 ENCOUNTER — Telehealth: Payer: Self-pay | Admitting: Family Medicine

## 2017-10-30 ENCOUNTER — Ambulatory Visit (HOSPITAL_COMMUNITY)
Admission: RE | Admit: 2017-10-30 | Discharge: 2017-10-30 | Disposition: A | Discharge status: Home / Self Care | Payer: Medicare Other | Source: Ambulatory Visit | Attending: Family Medicine | Admitting: Family Medicine

## 2017-10-30 DIAGNOSIS — R51 Headache: Secondary | ICD-10-CM | POA: Diagnosis not present

## 2017-10-30 DIAGNOSIS — H8149 Vertigo of central origin, unspecified ear: Secondary | ICD-10-CM | POA: Insufficient documentation

## 2017-10-30 DIAGNOSIS — R42 Dizziness and giddiness: Secondary | ICD-10-CM | POA: Insufficient documentation

## 2017-10-30 DIAGNOSIS — I739 Peripheral vascular disease, unspecified: Secondary | ICD-10-CM | POA: Insufficient documentation

## 2017-10-30 DIAGNOSIS — R270 Ataxia, unspecified: Secondary | ICD-10-CM | POA: Diagnosis not present

## 2017-10-30 DIAGNOSIS — J3489 Other specified disorders of nose and nasal sinuses: Secondary | ICD-10-CM | POA: Insufficient documentation

## 2017-10-30 NOTE — Telephone Encounter (Signed)
New referral put in. Tried to call pt multiple times to let her know. Number busy

## 2017-10-30 NOTE — Telephone Encounter (Signed)
I agree with the patient that this is too far out.  I recommend going with ENT in [place].  Dr. Mauro Kaufmann office would be good   please let the patient know that I reviewed over this message that I agree with the patient we will try to get her in sooner In [place]-in addition to this once this message is given to the patient please forward this to [facility]

## 2017-10-30 NOTE — Telephone Encounter (Signed)
Patient said that she got an appointment with Dr. Benjamine Mola in December for her vertigo.  She said this is too far out and would like to know if we can refer her elsewhere to get her in sooner.

## 2017-10-31 ENCOUNTER — Encounter: Payer: Self-pay | Admitting: Family Medicine

## 2017-10-31 NOTE — Telephone Encounter (Signed)
Patient notified and verbalized understanding. 

## 2017-11-01 MED ORDER — AMOXICILLIN 500 MG PO CAPS: 500.0000 mg | ORAL_CAPSULE | Freq: Three times a day (TID) | ORAL | 0 refills | Status: DC

## 2017-11-01 NOTE — Addendum Note (Signed)
Addended by: Dairl Ponder on: 11/01/2017 04:00 PM   Modules accepted: Orders

## 2017-11-08 DIAGNOSIS — H905 Unspecified sensorineural hearing loss: Secondary | ICD-10-CM | POA: Diagnosis not present

## 2017-11-08 DIAGNOSIS — R42 Dizziness and giddiness: Secondary | ICD-10-CM | POA: Diagnosis not present

## 2017-11-08 DIAGNOSIS — H903 Sensorineural hearing loss, bilateral: Secondary | ICD-10-CM | POA: Diagnosis not present

## 2018-01-01 ENCOUNTER — Other Ambulatory Visit: Payer: Self-pay | Admitting: Family Medicine

## 2018-01-01 DIAGNOSIS — Z1231 Encounter for screening mammogram for malignant neoplasm of breast: Secondary | ICD-10-CM

## 2018-01-23 ENCOUNTER — Ambulatory Visit (INDEPENDENT_AMBULATORY_CARE_PROVIDER_SITE_OTHER): Payer: Medicare Other | Admitting: Family Medicine

## 2018-01-23 ENCOUNTER — Encounter: Payer: Self-pay | Admitting: Family Medicine

## 2018-01-23 VITALS — BP 160/82 | Ht 61.0 in | Wt 128.0 lb

## 2018-01-23 DIAGNOSIS — I10 Essential (primary) hypertension: Secondary | ICD-10-CM | POA: Diagnosis not present

## 2018-01-23 DIAGNOSIS — Z23 Encounter for immunization: Secondary | ICD-10-CM | POA: Diagnosis not present

## 2018-01-23 MED ORDER — VERAPAMIL HCL ER 180 MG PO TBCR: 180.0000 mg | EXTENDED_RELEASE_TABLET | Freq: Every day | ORAL | 1 refills | Status: DC

## 2018-01-23 NOTE — Progress Notes (Signed)
   Subjective:    Patient ID: Hannah Roy, female    DOB: 03-Feb-1941, 77 y.o.   MRN: [id number]  HPI  Patient is here today to follow up on chronic health issues, also wants a flu shot. She states she eats healthy, and gets some exercise by taking care of husband.  Patient for blood pressure check up.  The patient does have hypertension.  The patient is on medication.  Patient relates compliance with meds. Todays BP reviewed with the patient. Patient denies issues with medication. Patient relates reasonable diet. Patient tries to minimize salt. Patient aware of BP goals.  Review of Systems  Constitutional: Negative for activity change, appetite change and fatigue.  HENT: Negative for congestion and rhinorrhea.   Respiratory: Negative for cough and shortness of breath.   Cardiovascular: Negative for chest pain and leg swelling.  Gastrointestinal: Negative for abdominal pain and diarrhea.  Endocrine: Negative for polydipsia and polyphagia.  Skin: Negative for color change.  Neurological: Negative for dizziness and weakness.  Psychiatric/Behavioral: Negative for behavioral problems and confusion.        Objective:   Physical Exam  Constitutional: She appears well-nourished. No distress.  HENT:  Head: Normocephalic and atraumatic.  Eyes: Right eye exhibits no discharge. Left eye exhibits no discharge.  Neck: No tracheal deviation present.  Cardiovascular: Normal rate, regular rhythm and normal heart sounds.  No murmur heard. Pulmonary/Chest: Effort normal and breath sounds normal. No respiratory distress.  Musculoskeletal: She exhibits no edema.  Lymphadenopathy:    She has no cervical adenopathy.  Neurological: She is alert. Coordination normal.  Skin: Skin is warm and dry.  Psychiatric: She has a normal mood and affect. Her behavior is normal.  Vitals reviewed.         Assessment & Plan:  HTN- Patient was seen today as part of a visit regarding hypertension. The importance  of healthy diet and regular physical activity was discussed. The importance of compliance with medications discussed.  Ideal goal is to keep blood pressure low elevated levels certainly below 063/01 when possible.  The patient was counseled that keeping blood pressure under control lessen his risk of complications.  The importance of regular follow-ups was discussed with the patient.  Low-salt diet such as DASH recommended.  Regular physical activity was recommended as well.  Patient was advised to keep regular follow-ups.   I rechecked the blood pressure several times 138/76 which is overall doing fairly well I would not recommend increasing the dose of her medicine because it could cause hypotension issues which could increase risk of falling follow-up in 6 months

## 2018-02-04 ENCOUNTER — Ambulatory Visit (HOSPITAL_COMMUNITY): Payer: Medicare Other

## 2018-02-13 ENCOUNTER — Ambulatory Visit (HOSPITAL_COMMUNITY): Payer: Medicare Other

## 2018-02-14 ENCOUNTER — Encounter (HOSPITAL_COMMUNITY): Payer: Self-pay

## 2018-02-14 ENCOUNTER — Ambulatory Visit (HOSPITAL_COMMUNITY)
Admission: RE | Admit: 2018-02-14 | Discharge: 2018-02-14 | Disposition: A | Discharge status: Home / Self Care | Payer: Medicare Other | Source: Ambulatory Visit | Attending: Family Medicine | Admitting: Family Medicine

## 2018-02-14 DIAGNOSIS — Z1231 Encounter for screening mammogram for malignant neoplasm of breast: Secondary | ICD-10-CM | POA: Diagnosis not present

## 2018-03-12 ENCOUNTER — Telehealth: Payer: Self-pay | Admitting: Family Medicine

## 2018-03-12 NOTE — Telephone Encounter (Signed)
Pt states she has vertigo and for states sale tax refund from [employer] stair [employer]. Form placed in red folder states listed required information for approval for a stair lift. Advise.

## 2018-03-12 NOTE — Telephone Encounter (Signed)
Please see

## 2018-03-14 NOTE — Telephone Encounter (Signed)
Pt calling to check on Stair [employer] form. Pt aware not completed at this time.

## 2018-03-14 NOTE — Telephone Encounter (Signed)
Form in dr scott's office per [facility]. Pt calling to check on. She was told it was not completed yet.

## 2018-03-16 NOTE — Telephone Encounter (Signed)
A prescription was written out with this form Please assist patient with this Either provide this to the patient or if she wants it taken care of fax or mail please assist thank you

## 2018-03-18 NOTE — Telephone Encounter (Signed)
Called pt to let her know rx and form was ready for pickup. She wanted it faxed to number on form. [employer] fax # [phone]. And wanted a copy for her records. Form was faxed and copy scanned to chart and original mail to to pt

## 2018-03-28 ENCOUNTER — Ambulatory Visit (INDEPENDENT_AMBULATORY_CARE_PROVIDER_SITE_OTHER): Payer: Medicare Other | Admitting: Otolaryngology

## 2018-03-28 DIAGNOSIS — R42 Dizziness and giddiness: Secondary | ICD-10-CM

## 2018-03-28 DIAGNOSIS — H9313 Tinnitus, bilateral: Secondary | ICD-10-CM

## 2018-03-28 DIAGNOSIS — H903 Sensorineural hearing loss, bilateral: Secondary | ICD-10-CM

## 2018-05-01 ENCOUNTER — Ambulatory Visit (INDEPENDENT_AMBULATORY_CARE_PROVIDER_SITE_OTHER): Payer: Medicare Other | Admitting: Family Medicine

## 2018-05-01 ENCOUNTER — Other Ambulatory Visit (HOSPITAL_COMMUNITY)
Admission: AD | Admit: 2018-05-01 | Discharge: 2018-05-01 | Disposition: A | Discharge status: Home / Self Care | Payer: Medicare Other | Source: Skilled Nursing Facility | Attending: Family Medicine | Admitting: Family Medicine

## 2018-05-01 ENCOUNTER — Encounter: Payer: Self-pay | Admitting: Family Medicine

## 2018-05-01 VITALS — Temp 98.2°F | Wt 128.2 lb

## 2018-05-01 DIAGNOSIS — R109 Unspecified abdominal pain: Secondary | ICD-10-CM | POA: Diagnosis not present

## 2018-05-01 LAB — HEPATIC FUNCTION PANEL
ALT: 11 U/L (ref 0–44)
AST: 13 U/L — ABNORMAL LOW (ref 15–41)
Albumin: 4.4 g/dL (ref 3.5–5.0)
Alkaline Phosphatase: 58 U/L (ref 38–126)
Bilirubin, Direct: 0.1 mg/dL (ref 0.0–0.2)
Indirect Bilirubin: 0.7 mg/dL (ref 0.3–0.9)
Total Bilirubin: 0.8 mg/dL (ref 0.3–1.2)
Total Protein: 8.2 g/dL — ABNORMAL HIGH (ref 6.5–8.1)

## 2018-05-01 LAB — BASIC METABOLIC PANEL
Anion gap: 9 (ref 5–15)
BUN: 15 mg/dL (ref 8–23)
CO2: 24 mmol/L (ref 22–32)
Calcium: 9 mg/dL (ref 8.9–10.3)
Chloride: 106 mmol/L (ref 98–111)
Creatinine, Ser: 0.85 mg/dL (ref 0.44–1.00)
GFR calc Af Amer: >60 mL/min (ref >60)
GFR calc non Af Amer: >60 mL/min (ref >60)
Glucose, Bld: 103 mg/dL — ABNORMAL HIGH (ref 70–99)
Potassium: 3.5 mmol/L (ref 3.5–5.1)
Sodium: 139 mmol/L (ref 135–145)

## 2018-05-01 LAB — CBC WITH DIFFERENTIAL/PLATELET
Abs Immature Granulocytes: 0.01 10*3/uL (ref 0.00–0.07)
Basophils Absolute: 0 10*3/uL (ref 0.0–0.1)
Basophils Relative: 0 %
Eosinophils Absolute: 0.1 10*3/uL (ref 0.0–0.5)
Eosinophils Relative: 1 %
HCT: 39.2 % (ref 36.0–46.0)
Hemoglobin: 12.5 g/dL (ref 12.0–15.0)
Immature Granulocytes: 0 %
Lymphocytes Relative: 22 %
Lymphs Abs: 1.7 10*3/uL (ref 0.7–4.0)
MCH: 30.9 pg (ref 26.0–34.0)
MCHC: 31.9 g/dL (ref 30.0–36.0)
MCV: 97 fL (ref 80.0–100.0)
Monocytes Absolute: 0.7 10*3/uL (ref 0.1–1.0)
Monocytes Relative: 9 %
Neutro Abs: 5.1 10*3/uL (ref 1.7–7.7)
Neutrophils Relative %: 68 %
Platelets: 280 10*3/uL (ref 150–400)
RBC: 4.04 MIL/uL (ref 3.87–5.11)
RDW: 12.1 % (ref 11.5–15.5)
WBC: 7.6 10*3/uL (ref 4.0–10.5)
nRBC: 0 % (ref 0.0–0.2)

## 2018-05-01 LAB — LIPASE, BLOOD: Lipase: 24 U/L (ref 11–51)

## 2018-05-01 MED ORDER — AMOXICILLIN 500 MG PO TABS: 500.0000 mg | ORAL_TABLET | Freq: Three times a day (TID) | ORAL | 0 refills | Status: DC

## 2018-05-01 NOTE — Progress Notes (Signed)
   Subjective:    Patient ID: Hannah Roy, female    DOB: [birth year], 78 y.o.   MRN: [id number]  Abdominal Pain  This is a new problem. The current episode started in the past 7 days. The pain is located in the right flank. The pain is severe. The quality of the pain is sharp (stabbing). The abdominal pain does not radiate. Associated symptoms include headaches. Pertinent negatives include no diarrhea, fever, nausea or vomiting. The pain is aggravated by movement. She has tried acetaminophen for the symptoms. The treatment provided no relief.   Pt states she cant take a deep breathing without her rib/side hurting.   Pt states she had congestion on Apr 09, 2018 for about 10 days with congestion, bloody mucus, headache, sore throat, diarrhea. Tried OTC medications and they did help.   Pt states she feels like she is going to get sick. Pt feels tired and achy, unable to sleep due to pain.   Review of Systems  Constitutional: Negative for activity change, fatigue and fever.  HENT: Negative for congestion and rhinorrhea.   Respiratory: Negative for cough, chest tightness and shortness of breath.   Cardiovascular: Negative for chest pain and leg swelling.  Gastrointestinal: Positive for abdominal pain. Negative for diarrhea, nausea and vomiting.  Skin: Negative for color change.  Neurological: Positive for headaches. Negative for dizziness.  Psychiatric/Behavioral: Negative for agitation and behavioral problems.       Objective:   Physical Exam Vitals signs and nursing note reviewed.  Constitutional:      Appearance: She is well-developed.  HENT:     Head: Normocephalic.     Nose: Nose normal.     Mouth/Throat:     Pharynx: No oropharyngeal exudate.  Neck:     Musculoskeletal: Neck supple.  Cardiovascular:     Rate and Rhythm: Normal rate.     Heart sounds: Normal heart sounds. No murmur.  Pulmonary:     Effort: Pulmonary effort is normal.     Breath sounds: Normal breath sounds.  No wheezing.  Lymphadenopathy:     Cervical: No cervical adenopathy.  Skin:    General: Skin is warm and dry.     Also recent sinus infection No [place] help with amoxicillin for the next 10 days  25 minutes was spent with the patient.  This statement verifies that 25 minutes was indeed spent with the patient.  More than 50% of this visit-total duration of the visit-was spent in counseling and coordination of care. The issues that the patient came in for today as reflected in the diagnosis (s) please refer to documentation for further details.     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

## 2018-05-02 ENCOUNTER — Ambulatory Visit (HOSPITAL_COMMUNITY)
Admission: RE | Admit: 2018-05-02 | Discharge: 2018-05-02 | Disposition: A | Discharge status: Home / Self Care | Payer: Medicare Other | Source: Ambulatory Visit | Attending: Family Medicine | Admitting: Family Medicine

## 2018-05-02 ENCOUNTER — Other Ambulatory Visit: Payer: Self-pay | Admitting: *Deleted

## 2018-05-02 ENCOUNTER — Telehealth: Payer: Self-pay | Admitting: *Deleted

## 2018-05-02 DIAGNOSIS — R109 Unspecified abdominal pain: Secondary | ICD-10-CM | POA: Diagnosis not present

## 2018-05-02 DIAGNOSIS — N858 Other specified noninflammatory disorders of uterus: Secondary | ICD-10-CM | POA: Diagnosis not present

## 2018-05-02 MED ORDER — IOPAMIDOL (ISOVUE-300) INJECTION 61%
100.0000 mL | Freq: Once | INTRAVENOUS | Status: AC | PRN
  Administered 2018-05-02: 100 mL via INTRAVENOUS

## 2018-05-02 MED ORDER — DICLOFENAC SODIUM 75 MG PO TBEC: 75.0000 mg | DELAYED_RELEASE_TABLET | Freq: Two times a day (BID) | ORAL | 0 refills | Status: DC

## 2018-05-02 NOTE — Telephone Encounter (Signed)
Discussed with pt. Pt verbalized understanding and med sent to pharm.

## 2018-05-02 NOTE — Telephone Encounter (Signed)
Call report from [facility] on stat ct scan today. Dr Nicki Reaper spoke with pt and told her he was going to send in anti inflammatory for her to use and pt made appt for a recheck next Tuesday with dr scott.   Pt notified dr Nicki Reaper will send in med by end of work day today. She wants to use [facility].

## 2018-05-02 NOTE — Telephone Encounter (Signed)
Patient to use Voltaren 75 mg 1 twice daily for her side pain #20 Do not take ibuprofen with this Follow-up office visit next week (FYI-we will discuss the full CAT scan on her follow-up)

## 2018-05-07 ENCOUNTER — Ambulatory Visit (INDEPENDENT_AMBULATORY_CARE_PROVIDER_SITE_OTHER): Payer: Medicare Other | Admitting: Family Medicine

## 2018-05-07 ENCOUNTER — Other Ambulatory Visit: Payer: Self-pay | Admitting: *Deleted

## 2018-05-07 ENCOUNTER — Ambulatory Visit: Payer: Medicare Other | Admitting: Family Medicine

## 2018-05-07 ENCOUNTER — Encounter: Payer: Self-pay | Admitting: Family Medicine

## 2018-05-07 VITALS — BP 130/84 | Temp 98.4°F | Ht 61.0 in | Wt 131.0 lb

## 2018-05-07 DIAGNOSIS — I517 Cardiomegaly: Secondary | ICD-10-CM

## 2018-05-07 DIAGNOSIS — R42 Dizziness and giddiness: Secondary | ICD-10-CM | POA: Diagnosis not present

## 2018-05-07 DIAGNOSIS — I700 Atherosclerosis of aorta: Secondary | ICD-10-CM

## 2018-05-07 DIAGNOSIS — R0609 Other forms of dyspnea: Secondary | ICD-10-CM

## 2018-05-07 DIAGNOSIS — I10 Essential (primary) hypertension: Secondary | ICD-10-CM

## 2018-05-07 DIAGNOSIS — Z13220 Encounter for screening for lipoid disorders: Secondary | ICD-10-CM

## 2018-05-07 DIAGNOSIS — R0600 Dyspnea, unspecified: Secondary | ICD-10-CM

## 2018-05-07 HISTORY — DX: Atherosclerosis of aorta: I70.0

## 2018-05-07 NOTE — Progress Notes (Signed)
   Subjective:    Patient ID: Hannah Roy, female    DOB: 12-20-1940, 78 y.o.   MRN: [id number]  HPIRecheck on abdominal pain. Pt states she is about 90% better today. Still having a little pain in her right side. Taking diclofenac when not driving because it makes her dizzy.   Patient recently had CAT scan I reviewed over the results with the patient in detail Reviewed over her lab work She states she is feeling better able to move around better denies any severe pain currently  Review of Systems  Constitutional: Negative for activity change, appetite change and fatigue.  HENT: Negative for congestion and rhinorrhea.   Respiratory: Positive for shortness of breath. Negative for cough.   Cardiovascular: Negative for chest pain and leg swelling.  Gastrointestinal: Negative for abdominal pain and diarrhea.  Endocrine: Negative for polydipsia and polyphagia.  Skin: Negative for color change.  Neurological: Negative for dizziness and weakness.  Psychiatric/Behavioral: Negative for behavioral problems and confusion.       Objective:   Physical Exam Vitals signs reviewed.  Constitutional:      General: She is not in acute distress. HENT:     Head: Normocephalic and atraumatic.  Eyes:     General:        Right eye: No discharge.        Left eye: No discharge.  Neck:     Trachea: No tracheal deviation.  Cardiovascular:     Rate and Rhythm: Normal rate and regular rhythm.     Heart sounds: Normal heart sounds. No murmur.  Pulmonary:     Effort: Pulmonary effort is normal. No respiratory distress.     Breath sounds: Normal breath sounds.  Lymphadenopathy:     Cervical: No cervical adenopathy.  Skin:    General: Skin is warm and dry.  Neurological:     Mental Status: She is alert.     Coordination: Coordination normal.  Psychiatric:        Behavior: Behavior normal.           Assessment & Plan:  Cardiomegaly with occasional shortness of breath with activity patient not  physically active because she has a debilitated husband she takes care of  We will do echo await the results  Aortic atherosclerosis cholesterol under good control blood pressure under good control continue current measures  Right side pain resolving  Follow-up in 3 months

## 2018-05-10 ENCOUNTER — Encounter: Payer: Self-pay | Admitting: Family Medicine

## 2018-05-10 ENCOUNTER — Emergency Department (HOSPITAL_COMMUNITY): Payer: Medicare Other

## 2018-05-10 ENCOUNTER — Ambulatory Visit (HOSPITAL_COMMUNITY)
Admission: RE | Admit: 2018-05-10 | Discharge: 2018-05-10 | Disposition: A | Discharge status: Home / Self Care | Payer: Medicare Other | Source: Ambulatory Visit | Attending: Family Medicine | Admitting: Family Medicine

## 2018-05-10 ENCOUNTER — Encounter (HOSPITAL_COMMUNITY): Payer: Self-pay

## 2018-05-10 ENCOUNTER — Other Ambulatory Visit: Payer: Self-pay

## 2018-05-10 ENCOUNTER — Ambulatory Visit (INDEPENDENT_AMBULATORY_CARE_PROVIDER_SITE_OTHER): Payer: Medicare Other | Admitting: Family Medicine

## 2018-05-10 ENCOUNTER — Inpatient Hospital Stay (HOSPITAL_COMMUNITY)
Admission: EM | Admit: 2018-05-10 | Discharge: 2018-05-11 | DRG: 310 | Disposition: A | Discharge status: Home / Self Care | Payer: Medicare Other | Attending: Internal Medicine | Admitting: Internal Medicine

## 2018-05-10 VITALS — BP 112/88 | HR 140 | Ht 61.0 in | Wt 129.0 lb

## 2018-05-10 DIAGNOSIS — I4891 Unspecified atrial fibrillation: Principal | ICD-10-CM | POA: Diagnosis present

## 2018-05-10 DIAGNOSIS — R7989 Other specified abnormal findings of blood chemistry: Secondary | ICD-10-CM

## 2018-05-10 DIAGNOSIS — I700 Atherosclerosis of aorta: Secondary | ICD-10-CM

## 2018-05-10 DIAGNOSIS — Z888 Allergy status to other drugs, medicaments and biological substances status: Secondary | ICD-10-CM

## 2018-05-10 DIAGNOSIS — Z800 Family history of malignant neoplasm of digestive organs: Secondary | ICD-10-CM

## 2018-05-10 DIAGNOSIS — R946 Abnormal results of thyroid function studies: Secondary | ICD-10-CM | POA: Diagnosis not present

## 2018-05-10 DIAGNOSIS — Z79899 Other long term (current) drug therapy: Secondary | ICD-10-CM | POA: Diagnosis not present

## 2018-05-10 DIAGNOSIS — R0600 Dyspnea, unspecified: Secondary | ICD-10-CM

## 2018-05-10 DIAGNOSIS — I517 Cardiomegaly: Secondary | ICD-10-CM | POA: Insufficient documentation

## 2018-05-10 DIAGNOSIS — R0602 Shortness of breath: Secondary | ICD-10-CM | POA: Diagnosis not present

## 2018-05-10 DIAGNOSIS — Z91040 Latex allergy status: Secondary | ICD-10-CM

## 2018-05-10 DIAGNOSIS — I480 Paroxysmal atrial fibrillation: Secondary | ICD-10-CM | POA: Diagnosis not present

## 2018-05-10 DIAGNOSIS — M8580 Other specified disorders of bone density and structure, unspecified site: Secondary | ICD-10-CM | POA: Diagnosis present

## 2018-05-10 DIAGNOSIS — I119 Hypertensive heart disease without heart failure: Secondary | ICD-10-CM | POA: Diagnosis present

## 2018-05-10 DIAGNOSIS — Z86010 Personal history of colonic polyps: Secondary | ICD-10-CM | POA: Diagnosis not present

## 2018-05-10 DIAGNOSIS — R0609 Other forms of dyspnea: Secondary | ICD-10-CM

## 2018-05-10 DIAGNOSIS — I10 Essential (primary) hypertension: Secondary | ICD-10-CM | POA: Diagnosis present

## 2018-05-10 DIAGNOSIS — E039 Hypothyroidism, unspecified: Secondary | ICD-10-CM | POA: Diagnosis present

## 2018-05-10 DIAGNOSIS — M1711 Unilateral primary osteoarthritis, right knee: Secondary | ICD-10-CM | POA: Diagnosis present

## 2018-05-10 DIAGNOSIS — R000 Tachycardia, unspecified: Secondary | ICD-10-CM | POA: Diagnosis not present

## 2018-05-10 HISTORY — DX: Unspecified atrial fibrillation: I48.91

## 2018-05-10 LAB — CBC WITH DIFFERENTIAL/PLATELET
Abs Immature Granulocytes: 0.02 10*3/uL (ref 0.00–0.07)
Basophils Absolute: 0 10*3/uL (ref 0.0–0.1)
Basophils Relative: 1 %
Eosinophils Absolute: 0.2 10*3/uL (ref 0.0–0.5)
Eosinophils Relative: 3 %
HCT: 39.3 % (ref 36.0–46.0)
Hemoglobin: 12.5 g/dL (ref 12.0–15.0)
Immature Granulocytes: 0 %
Lymphocytes Relative: 29 %
Lymphs Abs: 1.8 10*3/uL (ref 0.7–4.0)
MCH: 31.2 pg (ref 26.0–34.0)
MCHC: 31.8 g/dL (ref 30.0–36.0)
MCV: 98 fL (ref 80.0–100.0)
Monocytes Absolute: 0.6 10*3/uL (ref 0.1–1.0)
Monocytes Relative: 9 %
Neutro Abs: 3.6 10*3/uL (ref 1.7–7.7)
Neutrophils Relative %: 58 %
Platelets: 241 10*3/uL (ref 150–400)
RBC: 4.01 MIL/uL (ref 3.87–5.11)
RDW: 12.6 % (ref 11.5–15.5)
WBC: 6.2 10*3/uL (ref 4.0–10.5)
nRBC: 0 % (ref 0.0–0.2)

## 2018-05-10 LAB — COMPREHENSIVE METABOLIC PANEL
ALT: 33 U/L (ref 0–44)
AST: 22 U/L (ref 15–41)
Albumin: 4.3 g/dL (ref 3.5–5.0)
Alkaline Phosphatase: 74 U/L (ref 38–126)
Anion gap: 9 (ref 5–15)
BUN: 12 mg/dL (ref 8–23)
CO2: 23 mmol/L (ref 22–32)
Calcium: 9 mg/dL (ref 8.9–10.3)
Chloride: 107 mmol/L (ref 98–111)
Creatinine, Ser: 0.73 mg/dL (ref 0.44–1.00)
GFR calc Af Amer: >60 mL/min (ref >60)
GFR calc non Af Amer: >60 mL/min (ref >60)
Glucose, Bld: 95 mg/dL (ref 70–99)
Potassium: 3.7 mmol/L (ref 3.5–5.1)
Sodium: 139 mmol/L (ref 135–145)
Total Bilirubin: 0.9 mg/dL (ref 0.3–1.2)
Total Protein: 7.7 g/dL (ref 6.5–8.1)

## 2018-05-10 LAB — TSH: TSH: 7.998 u[IU]/mL — ABNORMAL HIGH (ref 0.350–4.500)

## 2018-05-10 LAB — MAGNESIUM: Magnesium: 2.2 mg/dL (ref 1.7–2.4)

## 2018-05-10 LAB — TROPONIN I: Troponin I: <0.03 ng/mL (ref <0.03)

## 2018-05-10 MED ORDER — SODIUM CHLORIDE 0.9 % IV SOLN: 250.0000 mL | INTRAVENOUS | Status: DC | PRN

## 2018-05-10 MED ORDER — COQ-10 200 MG PO CAPS: 200.0000 mg | ORAL_CAPSULE | Freq: Every day | ORAL | Status: DC

## 2018-05-10 MED ORDER — ONDANSETRON HCL 4 MG/2ML IJ SOLN: 4.0000 mg | Freq: Four times a day (QID) | INTRAMUSCULAR | Status: DC | PRN

## 2018-05-10 MED ORDER — LORATADINE 10 MG PO TABS: 10.0000 mg | ORAL_TABLET | Freq: Every day | ORAL | Status: DC | PRN

## 2018-05-10 MED ORDER — FLAX SEED OIL 1000 MG PO CAPS: 1000.0000 mg | ORAL_CAPSULE | Freq: Every day | ORAL | Status: DC

## 2018-05-10 MED ORDER — DILTIAZEM HCL 100 MG IV SOLR
5.0000 mg/h | INTRAVENOUS | Status: DC
  Administered 2018-05-10: 5 mg/h via INTRAVENOUS
  Administered 2018-05-10: 7.5 mg/h via INTRAVENOUS
  Filled 2018-05-10 (×2): qty 100

## 2018-05-10 MED ORDER — APIXABAN 5 MG PO TABS
5.0000 mg | ORAL_TABLET | Freq: Two times a day (BID) | ORAL | Status: DC
  Administered 2018-05-10 – 2018-05-11 (×2): 5 mg via ORAL
  Filled 2018-05-10 (×2): qty 1

## 2018-05-10 MED ORDER — SODIUM CHLORIDE 0.9% FLUSH: 3.0000 mL | INTRAVENOUS | Status: DC | PRN

## 2018-05-10 MED ORDER — ONDANSETRON HCL 4 MG PO TABS: 4.0000 mg | ORAL_TABLET | Freq: Four times a day (QID) | ORAL | Status: DC | PRN

## 2018-05-10 MED ORDER — ACETAMINOPHEN 650 MG RE SUPP: 650.0000 mg | Freq: Four times a day (QID) | RECTAL | Status: DC | PRN

## 2018-05-10 MED ORDER — VITAMIN D 25 MCG (1000 UNIT) PO TABS
2000.0000 [IU] | ORAL_TABLET | Freq: Every day | ORAL | Status: DC
  Administered 2018-05-11: 2000 [IU] via ORAL
  Filled 2018-05-10: qty 2

## 2018-05-10 MED ORDER — DILTIAZEM LOAD VIA INFUSION
10.0000 mg | Freq: Once | INTRAVENOUS | Status: AC
  Administered 2018-05-10: 10 mg via INTRAVENOUS
  Filled 2018-05-10: qty 10

## 2018-05-10 MED ORDER — SODIUM CHLORIDE 0.9% FLUSH
3.0000 mL | Freq: Two times a day (BID) | INTRAVENOUS | Status: DC
  Administered 2018-05-10 – 2018-05-11 (×3): 3 mL via INTRAVENOUS

## 2018-05-10 MED ORDER — ACETAMINOPHEN 325 MG PO TABS: 650.0000 mg | ORAL_TABLET | Freq: Four times a day (QID) | ORAL | Status: DC | PRN

## 2018-05-10 MED ORDER — MELATONIN 3 MG PO TABS: 6.0000 mg | ORAL_TABLET | Freq: Every evening | ORAL | Status: DC | PRN

## 2018-05-10 MED ORDER — VITAMIN B-12 1000 MCG PO TABS
1000.0000 ug | ORAL_TABLET | Freq: Every day | ORAL | Status: DC
  Administered 2018-05-11: 1000 ug via ORAL
  Filled 2018-05-10: qty 1

## 2018-05-10 MED ORDER — DILTIAZEM HCL 60 MG PO TABS
60.0000 mg | ORAL_TABLET | Freq: Four times a day (QID) | ORAL | Status: DC
  Administered 2018-05-10 – 2018-05-11 (×3): 60 mg via ORAL
  Filled 2018-05-10 (×3): qty 1

## 2018-05-10 MED ORDER — ZOLPIDEM TARTRATE 5 MG PO TABS
5.0000 mg | ORAL_TABLET | Freq: Every evening | ORAL | Status: DC | PRN
  Administered 2018-05-10: 5 mg via ORAL
  Filled 2018-05-10: qty 1

## 2018-05-10 MED ORDER — LEVOCETIRIZINE DIHYDROCHLORIDE 5 MG PO TABS: 5.0000 mg | ORAL_TABLET | Freq: Every day | ORAL | Status: DC | PRN

## 2018-05-10 MED ORDER — DILTIAZEM HCL 30 MG PO TABS: 30.0000 mg | ORAL_TABLET | Freq: Four times a day (QID) | ORAL | Status: DC

## 2018-05-10 MED ORDER — IBUPROFEN 800 MG PO TABS: 800.0000 mg | ORAL_TABLET | Freq: Three times a day (TID) | ORAL | Status: DC | PRN

## 2018-05-10 NOTE — Progress Notes (Signed)
   Subjective:    Patient ID: Hannah Roy, female    DOB: 10-03-1940, 78 y.o.   MRN: [id number]  HPI Patient walked into the clinic today and states she just finished her echo over at the hospital and wanted to see if we could see the results.  The patient relates that intermittently she has been having rapid heart rate She states she has been having a rapid heart rate for the last two weeks. She states she is getting more and more tired and has a headache and just wants to lay down.   She is not having any chest pain.Does have some shortness of breath.  She does state that she does feel some epigastric indigestion off and on over the past few days she states she does work out normally and is unable to do that lately.    Spoke with Dr.Scott Luking he will be working her in to day to discuss her concerns.   Review of Systems  Constitutional: Negative for activity change, appetite change and fatigue.  HENT: Negative for congestion.   Respiratory: Positive for shortness of breath. Negative for cough.   Cardiovascular: Positive for palpitations. Negative for chest pain.  Gastrointestinal: Negative for abdominal pain.  Skin: Negative for color change.  Neurological: Negative for headaches.  Psychiatric/Behavioral: Negative for behavioral problems.       Objective:   Physical Exam Vitals signs reviewed.  Constitutional:      General: She is not in acute distress. HENT:     Head: Normocephalic and atraumatic.  Eyes:     General:        Right eye: No discharge.        Left eye: No discharge.  Neck:     Trachea: No tracheal deviation.  Cardiovascular:     Rate and Rhythm: Normal rate. Rhythm irregular.     Heart sounds: Normal heart sounds. No murmur.  Pulmonary:     Effort: Pulmonary effort is normal. No respiratory distress.     Breath sounds: Normal breath sounds.  Lymphadenopathy:     Cervical: No cervical adenopathy.  Skin:    General: Skin is warm and dry.    Neurological:     Mental Status: She is alert.     Coordination: Coordination normal.  Psychiatric:        Behavior: Behavior normal.     EKG shows new onset atrial flutter Also showed some ST segment depression in V5 and V6 25 minutes was spent with the patient.  This statement verifies that 25 minutes was indeed spent with the patient.  More than 50% of this visit-total duration of the visit-was spent in counseling and coordination of care. The issues that the patient came in for today as reflected in the diagnosis (s) please refer to documentation for further details.  ER MD spoke with triage spoke with    Assessment & Plan:  Atrial fibrillation Rate reasonable Some ST segment depression Patient needs further evaluation with possible troponin.  Patient has disabled spouse she would prefer to go home Hopefully ER can evaluate her further then we can set her up to see cardiology next week  EKG was compared to previous EKG from 2019

## 2018-05-10 NOTE — Progress Notes (Signed)
ANTICOAGULATION CONSULT NOTE - Initial Consult  Pharmacy Consult for  apixaban dosing Indication: atrial fibrillation (non-valvular)  Allergies  Allergen Reactions  . Bee Venom Anaphylaxis  . Boniva [Ibandronic Acid]     Upset stomach, flu like symptoms   . Lisinopril Cough  . Latex Rash    Vital Signs: Temp: 98.6 F (37 C) (01/31 1237) Temp Source: Oral (01/31 1237) BP: 127/99 (01/31 1525) Pulse Rate: 65 (01/31 1536)  Labs: Recent Labs    05/10/18 1239  HGB 12.5  HCT 39.3  PLT 241  CREATININE 0.73  TROPONINI <0.03    Estimated Creatinine Clearance: 50 mL/min (by C-G formula based on SCr of 0.73 mg/dL).  Medical History: Past Medical History:  Diagnosis Date  . Aortic atherosclerosis ([place]) 05/07/2018  . Colon polyps   . History of seasonal allergies   . Hypertension   . Osteopenia 2006    Assessment: Pharmacy consulted to dose apixaban for this  78 yo female with new-onset atrial fibrillation with RVR.  Patient has a CHADS score of 4.  At home, she was on verapamil for rate control, but was on no other anti-coagulation medications.     Plan:  Start apixaban 5mg  bid. Would advise discontinuation of high-dose ibuprofen. Monitor patient for signs and symptoms of bleeding. Educate patient before she discharges home.    Despina Pole 05/10/2018,3:53 PM

## 2018-05-10 NOTE — Progress Notes (Signed)
*  PRELIMINARY RESULTS* Echocardiogram 2D Echocardiogram has been performed.  Samuel Germany 05/10/2018, 11:32 AM

## 2018-05-10 NOTE — H&P (Signed)
History and Physical    Hannah Roy LFY:[id number] DOB: 01-09-1941 DOA: 05/10/2018  PCP: Kathyrn Drown, MD   Patient coming from: Home  Chief Complaint: Fatigue and DOE  HPI: Hannah Roy is a 78 y.o. female with medical history significant for seasonal allergies, hypertension, and osteoarthritis who presented to the ED via her PCP on account of worsening fatigue as well as shortness of breath with exertion.  She has been having increased stress with taking care of her husband who has cancer.  She denies any palpitations or lightheadedness or dizziness.  She denies any chest pain, fevers, chills, nausea, vomiting, diarrhea, or other symptoms.  She has had 2D echocardiogram with no significant findings otherwise noted.   ED Course: Vital signs are stable aside from elevated heart rate demonstrating atrial fibrillation with RVR on telemetry.  Patient has been started on Cardizem drip.  She is otherwise asymptomatic at this point in time.  Lab work unremarkable aside from [facility] of 8.  Two-view chest x-ray with some cardiomegaly, but no pulmonary vascular congestion.  2D echocardiogram performed today with LVEF 60 to 65%.  Review of Systems: All others reviewed and otherwise negative.  Past Medical History:  Diagnosis Date  . Aortic atherosclerosis ([place]) 05/07/2018  . Colon polyps   . History of seasonal allergies   . Hypertension   . Osteopenia 2006    Past Surgical History:  Procedure Laterality Date  . APPENDECTOMY    . COLONOSCOPY  03/23/2011   repeat in 5 years,Procedure: COLONOSCOPY;  Surgeon: Rogene Houston, MD;  [facility];  Service: Endoscopy;  Laterality: N/A;  1:00  . COLONOSCOPY N/A 11/09/2016   Procedure: COLONOSCOPY;  Surgeon: Rogene Houston, MD;  Location: [facility];  Service: Endoscopy;  Laterality: N/A;  1030  . TUBAL LIGATION       reports that she has quit smoking. She has a 5.00 pack-year smoking history. She has never used smokeless tobacco. She  reports that she does not drink alcohol or use drugs.  Allergies  Allergen Reactions  . Bee Venom Anaphylaxis  . Boniva [Ibandronic Acid]     Upset stomach, flu like symptoms   . Lisinopril Cough  . Latex Rash    Family History  Problem Relation Age of Onset  . Colon cancer Daughter     Prior to Admission medications   Medication Sig Start Date End Date Taking? Authorizing Provider  amoxicillin (AMOXIL) 500 MG tablet Take 1 tablet (500 mg total) by mouth 3 (three) times daily. Patient not taking: Reported on 05/10/2018 05/01/18   Kathyrn Drown, MD  CALCIUM-MAGNESIUM PO Take 2 tablets by mouth daily.    [provider]  Cholecalciferol (VITAMIN D) 2000 units CAPS Take 2,000 Units by mouth daily.    [provider]  Coenzyme Q10 (COQ-10) 200 MG CAPS Take 200 mg by mouth daily.    [provider]  diclofenac (VOLTAREN) 75 MG EC tablet Take 1 tablet (75 mg total) by mouth 2 (two) times daily. Patient not taking: Reported on 05/10/2018 05/02/18   Kathyrn Drown, MD  Flaxseed, Linseed, (FLAX SEED OIL) 1000 MG CAPS Take 1,000 mg by mouth daily.    [provider]  ibuprofen (ADVIL,MOTRIN) 800 MG tablet Take 800 mg by mouth every 8 (eight) hours as needed for moderate pain.    [provider]  levocetirizine (XYZAL) 5 MG tablet Take 5 mg by mouth daily as needed for allergies.  [provider]  meclizine (ANTIVERT) 12.5 MG tablet Take 1 tablet (12.5 mg total) by mouth 3 (three) times daily as needed for dizziness. Patient not taking: Reported on 05/10/2018 10/23/17   Kathyrn Drown, MD  Melatonin 5 MG TABS Take 5 mg by mouth at bedtime as needed (sleep).    [provider]  valACYclovir (VALTREX) 1000 MG tablet 2 pills bid times one day as needed for fever blisters Patient not taking: Reported on 05/10/2018 05/10/17   Kathyrn Drown, MD  verapamil (CALAN-SR) 180 MG CR tablet Take 1 tablet (180 mg total) by mouth daily. Patient  not taking: Reported on 05/10/2018 01/23/18   Kathyrn Drown, MD  vitamin B-12 (CYANOCOBALAMIN) 1000 MCG tablet Take 1,000 mcg by mouth daily.    [provider]    Physical Exam: Vitals:   05/10/18 1235 05/10/18 1237  BP: (!) 127/96   Pulse: (!) 130   Resp: 12   Temp:  98.6 F (37 C)  TempSrc:  Oral  SpO2: 100%     Constitutional: NAD, calm, comfortable Vitals:   05/10/18 1235 05/10/18 1237  BP: (!) 127/96   Pulse: (!) 130   Resp: 12   Temp:  98.6 F (37 C)  TempSrc:  Oral  SpO2: 100%    Eyes: lids and conjunctivae normal ENMT: Mucous membranes are moist.  Neck: normal, supple Respiratory: clear to auscultation bilaterally. Normal respiratory effort. No accessory muscle use.  Cardiovascular: Irregular rate and rhythm with tachycardia, no murmurs. No extremity edema. Abdomen: no tenderness, no distention. Bowel sounds positive.  Musculoskeletal:  No joint deformity upper and lower extremities.   Skin: no rashes, lesions, ulcers.  Psychiatric: Normal judgment and insight. Alert and oriented x 3. Normal mood.   Labs on Admission: I have personally reviewed following labs and imaging studies  CBC: Recent Labs  Lab 05/10/18 1239  WBC 6.2  NEUTROABS 3.6  HGB 12.5  HCT 39.3  MCV 98.0  PLT 448   Basic Metabolic Panel: Recent Labs  Lab 05/10/18 1239  NA 139  K 3.7  CL 107  CO2 23  GLUCOSE 95  BUN 12  CREATININE 0.73  CALCIUM 9.0  MG 2.2   GFR: Estimated Creatinine Clearance: 50 mL/min (by C-G formula based on SCr of 0.73 mg/dL). Liver Function Tests: Recent Labs  Lab 05/10/18 1239  AST 22  ALT 33  ALKPHOS 74  BILITOT 0.9  PROT 7.7  ALBUMIN 4.3   No results for input(s): LIPASE, AMYLASE in the last 168 hours. No results for input(s): AMMONIA in the last 168 hours. Coagulation Profile: No results for input(s): INR, PROTIME in the last 168 hours. Cardiac Enzymes: Recent Labs  Lab 05/10/18 1239  TROPONINI <0.03   BNP (last 3  results) No results for input(s): PROBNP in the last 8760 hours. HbA1C: No results for input(s): HGBA1C in the last 72 hours. CBG: No results for input(s): GLUCAP in the last 168 hours. Lipid Profile: No results for input(s): CHOL, HDL, LDLCALC, TRIG, CHOLHDL, LDLDIRECT in the last 72 hours. Thyroid Function Tests: Recent Labs    05/10/18 1239  TSH 7.998*   Anemia Panel: No results for input(s): VITAMINB12, FOLATE, FERRITIN, TIBC, IRON, RETICCTPCT in the last 72 hours. Urine analysis:    Component Value Date/Time   COLORURINE STRAW (A) 04/21/2017 1253   APPEARANCEUR CLEAR 04/21/2017 1253   LABSPEC 1.005 04/21/2017 1253   PHURINE 7.0 04/21/2017 1253   GLUCOSEU NEGATIVE 04/21/2017 1253   HGBUR  NEGATIVE 04/21/2017 [place] 04/21/2017 1253   KETONESUR NEGATIVE 04/21/2017 1253   PROTEINUR NEGATIVE 04/21/2017 1253   UROBILINOGEN 0.2 11/10/2010 2001   NITRITE NEGATIVE 04/21/2017 1253   LEUKOCYTESUR NEGATIVE 04/21/2017 1253    Radiological Exams on Admission: Dg Chest 2 View  Result Date: 05/10/2018 CLINICAL DATA:  Sudden onset atrial fibrillation. Weakness. Shortness of breath. EXAM: CHEST - 2 VIEW COMPARISON:  10/10/2011. FINDINGS: Mediastinum and hilar structures normal. Cardiomegaly with normal pulmonary vascularity. No focal infiltrate. Small right pleural effusion can not be excluded. No pneumothorax. IMPRESSION: 1.  Cardiomegaly.  No pulmonary venous congestion. 2. No acute infiltrate. Small right pleural effusion can not be excluded. Electronically Signed   By: Marcello Moores  Register   On: 05/10/2018 13:53    EKG: Independently reviewed.  Atrial flutter 137 bpm.  Assessment/Plan Principal Problem:   Atrial fibrillation with RVR (HCC) Active Problems:   Essential hypertension, benign   Osteoarthritis of right knee   Hypothyroidism    1. New onset atrial fibrillation with RVR.  TSH is noted to be slightly elevated, but will defer initiation of Synthroid at  this time on account of elevated heart rates.  This can be addressed with PCP.  2D echocardiogram with preserved LVEF.  Currently on IV Cardizem which will transition to oral and wean in stepdown unit.  Will start Eliquis on account of chads score of 4. 2. Essential hypertension.  Hold home verapamil and maintain on Cardizem as noted for rate control blood pressure management.  Continue to monitor carefully. 3. Osteoarthritis.  Continue NSAIDs as previously scheduled.   DVT prophylaxis:  Eliquis  Code Status: Full Family Communication: None at bedside Disposition Plan:Admit for HR control  Consults called:Cardiology Admission status: Inpatient, SDU   Pratik D Manuella Ghazi DO [facility] Pager [phone]  If 7PM-7AM, please contact night-coverage [email or web address] Password [email or web address]  05/10/2018, 2:50 PM

## 2018-05-10 NOTE — ED Triage Notes (Signed)
Pt here for new onset A-Fib. Sent by Dr. Sallee Lange. States she has been SOB with exertion and lethargy

## 2018-05-10 NOTE — ED Notes (Addendum)
MD at bedside. 

## 2018-05-10 NOTE — ED Provider Notes (Signed)
[facility] EMERGENCY DEPARTMENT Provider Note   CSN: [id number] Arrival date & time: 05/10/18  1222     History   Chief Complaint Chief Complaint  Patient presents with  . Atrial Fibrillation    HPI Hannah Roy is a 78 y.o. female.  Patient with history of high blood pressure and osteopenia presents with fatigue and elevated heart rate.  Patient sent over from primary doctor for further evaluation.  Patient has had increased stress taking care of her husband who has cancer.  Patient's had mild fatigue and mild shortness of breath with exertion.  No known heart problems.  Patient had echo cardiogram today.  Patient is not on blood thinners.     Past Medical History:  Diagnosis Date  . Aortic atherosclerosis ([place]) 05/07/2018  . Colon polyps   . History of seasonal allergies   . Hypertension   . Osteopenia 2006    Patient Active Problem List   Diagnosis Date Noted  . Atrial fibrillation with RVR ([place]) 05/10/2018  . Aortic atherosclerosis ([place]) 05/07/2018  . History of colonic polyps 08/03/2016  . Osteoarthritis of right knee 10/17/2013  . Essential hypertension, benign 07/30/2012  . Allergic rhinitis 07/30/2012  . 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 Osteopenia 07/30/2012    Past Surgical History:  Procedure Laterality Date  . APPENDECTOMY    . COLONOSCOPY  03/23/2011   repeat in 5 years,Procedure: COLONOSCOPY;  Surgeon: Rogene Houston, MD;  [facility];  Service: Endoscopy;  Laterality: N/A;  1:00  . COLONOSCOPY N/A 11/09/2016   Procedure: COLONOSCOPY;  Surgeon: Rogene Houston, MD;  Location[facility];  Service: Endoscopy;  Laterality: N/A;  1030  . TUBAL LIGATION       OB History   No obstetric history on file.      Home Medications    Prior to Admission medications   Medication Sig Start Date End Date Taking? Authorizing Provider  amoxicillin (AMOXIL) 500 MG tablet Take 1 tablet (500 mg total) by mouth 3 (three) times daily. Patient  not taking: Reported on 05/10/2018 05/01/18   Kathyrn Drown, MD  CALCIUM-MAGNESIUM PO Take 2 tablets by mouth daily.    [provider]  Cholecalciferol (VITAMIN D) 2000 units CAPS Take 2,000 Units by mouth daily.    [provider]  Coenzyme Q10 (COQ-10) 200 MG CAPS Take 200 mg by mouth daily.    [provider]  diclofenac (VOLTAREN) 75 MG EC tablet Take 1 tablet (75 mg total) by mouth 2 (two) times daily. Patient not taking: Reported on 05/10/2018 05/02/18   Kathyrn Drown, MD  Flaxseed, Linseed, (FLAX SEED OIL) 1000 MG CAPS Take 1,000 mg by mouth daily.    [provider]  ibuprofen (ADVIL,MOTRIN) 800 MG tablet Take 800 mg by mouth every 8 (eight) hours as needed for moderate pain.    [provider]  levocetirizine (XYZAL) 5 MG tablet Take 5 mg by mouth daily as needed for allergies.    [provider]  meclizine (ANTIVERT) 12.5 MG tablet Take 1 tablet (12.5 mg total) by mouth 3 (three) times daily as needed for dizziness. Patient not taking: Reported on 05/10/2018 10/23/17   Kathyrn Drown, MD  Melatonin 5 MG TABS Take 5 mg by mouth at bedtime as needed (sleep).    [provider]  valACYclovir (VALTREX) 1000 MG tablet 2 pills bid times one day as needed for fever blisters Patient not taking: Reported on 05/10/2018 05/10/17  Kathyrn Drown, MD  verapamil (CALAN-SR) 180 MG CR tablet Take 1 tablet (180 mg total) by mouth daily. Patient not taking: Reported on 05/10/2018 01/23/18   Kathyrn Drown, MD  vitamin B-12 (CYANOCOBALAMIN) 1000 MCG tablet Take 1,000 mcg by mouth daily.    [provider]    Family History Family History  Problem Relation Age of Onset  . Colon cancer Daughter     Social History Social History   Tobacco Use  . Smoking status: Former Smoker    Packs/day: 0.50    Years: 10.00    Pack years: 5.00  . Smokeless tobacco: Never Used  Substance Use Topics  . Alcohol use: No  . Drug use: No      Allergies   Bee venom; Boniva [ibandronic acid]; Lisinopril; and Latex   Review of Systems Review of Systems  Constitutional: Positive for fatigue. Negative for chills and fever.  HENT: Negative for congestion.   Eyes: Negative for visual disturbance.  Respiratory: Positive for shortness of breath.   Cardiovascular: Negative for chest pain.  Gastrointestinal: Negative for abdominal pain and vomiting.  Genitourinary: Negative for dysuria and flank pain.  Musculoskeletal: Negative for back pain, neck pain and neck stiffness.  Skin: Negative for rash.  Neurological: Negative for light-headedness and headaches.     Physical Exam Updated Vital Signs BP (!) 127/96 (BP Location: Right Arm)   Pulse (!) 130   Temp 98.6 F (37 C) (Oral)   Resp 12   SpO2 100%   Physical Exam Vitals signs and nursing note reviewed.  Constitutional:      Appearance: She is well-developed.  HENT:     Head: Normocephalic and atraumatic.  Eyes:     General:        Right eye: No discharge.        Left eye: No discharge.     Conjunctiva/sclera: Conjunctivae normal.  Neck:     Musculoskeletal: Normal range of motion and neck supple.     Trachea: No tracheal deviation.  Cardiovascular:     Rate and Rhythm: Tachycardia present. Rhythm irregularly irregular.  Pulmonary:     Effort: Pulmonary effort is normal.     Breath sounds: Normal breath sounds.  Abdominal:     General: There is no distension.     Palpations: Abdomen is soft.     Tenderness: There is no abdominal tenderness. There is no guarding.  Skin:    General: Skin is warm.     Findings: No rash.  Neurological:     Mental Status: She is alert and oriented to person, place, and time.      ED Treatments / Results  Labs (all labs ordered are listed, but only abnormal results are displayed) Labs Reviewed  TSH - Abnormal; Notable for the following components:      Result Value   TSH 7.998 (*)    All other components within  normal limits  CBC WITH DIFFERENTIAL/PLATELET  COMPREHENSIVE METABOLIC PANEL  MAGNESIUM  TROPONIN I    EKG None  Radiology Dg Chest 2 View  Result Date: 05/10/2018 CLINICAL DATA:  Sudden onset atrial fibrillation. Weakness. Shortness of breath. EXAM: CHEST - 2 VIEW COMPARISON:  10/10/2011. FINDINGS: Mediastinum and hilar structures normal. Cardiomegaly with normal pulmonary vascularity. No focal infiltrate. Small right pleural effusion can not be excluded. No pneumothorax. IMPRESSION: 1.  Cardiomegaly.  No pulmonary venous congestion. 2. No acute infiltrate. Small right pleural effusion can not be excluded. Electronically Signed  By: [place]   On: 05/10/2018 13:53    Procedures .Critical Care Performed by: Elnora Morrison, MD Authorized by: Elnora Morrison, MD   Critical care provider statement:    Critical care time (minutes):  40   Critical care start time:  05/10/2018 1:40 PM   Critical care end time:  05/10/2018 2:20 PM   Critical care time was exclusive of:  Separately billable procedures and treating other patients and teaching time   Critical care was necessary to treat or prevent imminent or life-threatening deterioration of the following conditions:  Cardiac failure   Critical care was time spent personally by me on the following activities:  Discussions with consultants, evaluation of patient's response to treatment, examination of patient, ordering and performing treatments and interventions, ordering and review of laboratory studies, ordering and review of radiographic studies, pulse oximetry, re-evaluation of patient's condition, obtaining history from patient or surrogate and review of old charts   (including critical care time)  Medications Ordered in ED Medications  diltiazem (CARDIZEM) 1 mg/mL load via infusion 10 mg (has no administration in time range)    And  diltiazem (CARDIZEM) 100 mg in dextrose 5 % 100 mL (1 mg/mL) infusion (5 mg/hr Intravenous New  Bag/Given 05/10/18 1348)     Initial Impression / Assessment and Plan / ED Course  I have reviewed the triage vital signs and the nursing notes.  Pertinent labs & imaging results that were available during my care of the patient were reviewed by me and considered in my medical decision making (see chart for details).    Patient presents with elevated heart rate and fatigue.  EKG reviewed consistent with atrial flutter with rapid rate.  Patient has minimal symptoms at this time.  Diltiazem bolus with drip ordered to be titrated.  Patient requests observation only as long as needed until she gets on oral medication so she can be home to take care of her husband.  Blood work reviewed overall unremarkable except for TSH.  Discussed case with cardiology on-call and hospitalist for observation/admission.  The patients results and plan were reviewed and discussed.   Any x-rays performed were independently reviewed by myself.   Differential diagnosis were considered with the presenting HPI.  Medications  diltiazem (CARDIZEM) 1 mg/mL load via infusion 10 mg (has no administration in time range)    And  diltiazem (CARDIZEM) 100 mg in dextrose 5 % 100 mL (1 mg/mL) infusion (5 mg/hr Intravenous New Bag/Given 05/10/18 1348)    Vitals:   05/10/18 1235 05/10/18 1237  BP: (!) 127/96   Pulse: (!) 130   Resp: 12   Temp:  98.6 F (37 C)  TempSrc:  Oral  SpO2: 100%     Final diagnoses:  Atrial fibrillation with RVR (HCC)  Abnormal TSH    Admission/ observation were discussed with the admitting physician, patient and/or family and they are comfortable with the plan.   Final Clinical Impressions(s) / ED Diagnoses   Final diagnoses:  Atrial fibrillation with RVR ([place])  Abnormal TSH    ED Discharge Orders    None       Elnora Morrison, MD 05/10/18 1433

## 2018-05-11 LAB — BASIC METABOLIC PANEL
Anion gap: 7 (ref 5–15)
BUN: 16 mg/dL (ref 8–23)
CO2: 22 mmol/L (ref 22–32)
Calcium: 8.5 mg/dL — ABNORMAL LOW (ref 8.9–10.3)
Chloride: 108 mmol/L (ref 98–111)
Creatinine, Ser: 0.81 mg/dL (ref 0.44–1.00)
GFR calc Af Amer: >60 mL/min (ref >60)
GFR calc non Af Amer: >60 mL/min (ref >60)
Glucose, Bld: 89 mg/dL (ref 70–99)
Potassium: 3.4 mmol/L — ABNORMAL LOW (ref 3.5–5.1)
Sodium: 137 mmol/L (ref 135–145)

## 2018-05-11 LAB — MRSA PCR SCREENING: MRSA by PCR: NEGATIVE

## 2018-05-11 LAB — MAGNESIUM: Magnesium: 2.3 mg/dL (ref 1.7–2.4)

## 2018-05-11 MED ORDER — APIXABAN 5 MG PO TABS: 5.0000 mg | ORAL_TABLET | Freq: Two times a day (BID) | ORAL | 3 refills | Status: DC

## 2018-05-11 MED ORDER — DILTIAZEM HCL ER COATED BEADS 240 MG PO CP24: 240.0000 mg | ORAL_CAPSULE | Freq: Every day | ORAL | 11 refills | Status: DC

## 2018-05-11 NOTE — Discharge Summary (Signed)
Physician Discharge Summary  Hannah Roy LSL:[id number] DOB: September 18, 1940 DOA: 05/10/2018  PCP: Kathyrn Drown, MD  Admit date: 05/10/2018  Discharge date: 05/11/2018  Admitted From:Home  Disposition:  Home  Recommendations for Outpatient Follow-up:  1. Follow up with PCP in 3-5 days to check BP and HR and have hypothyroidism addressed 2. Follow up with Cardiology Dr. [name] in 1 week  3. Continue on Eliquis as prescribed 4. Discontinue Verapamil and start Cardizem as prescribed  Home Health:None  Equipment/Devices:None  Discharge Condition:Stable  CODE STATUS: Full  Diet recommendation: Heart Healthy  Brief/Interim Summary: Per HPI: Hannah Roy is a 78 y.o. female with medical history significant for seasonal allergies, hypertension, and osteoarthritis who presented to the ED via her PCP on account of worsening fatigue as well as shortness of breath with exertion.  She has been having increased stress with taking care of her husband who has cancer.  She denies any palpitations or lightheadedness or dizziness.  She denies any chest pain, fevers, chills, nausea, vomiting, diarrhea, or other symptoms.  She has had 2D echocardiogram with no significant findings otherwise noted.  Patient was admitted with new onset atrial fibrillation with RVR and was started on IV Cardizem drip and transition to oral Cardizem 60 mg every 6 hours.  She has done quite well overnight and has been weaned off of her Cardizem drip for over the last 8 hours and is currently in sinus rhythm.  She has no further symptomatology and has stable blood pressure readings.  She will be transition to Cardizem 240 mg daily and this will replace her home verapamil.  She will also remain on Eliquis for anticoagulation given chads score 4.  She has done quite well overnight with no other acute events noted during this inpatient stay.  2D echocardiogram was performed on 1/31 with findings of LVEF 60 to 65%.  TSH is noted to be  7.9 and will need to be addressed in the outpatient setting once she remains with stable heart rate and blood pressure readings.  Discharge Diagnoses:  Principal Problem:   Atrial fibrillation with RVR ([place]) Active Problems:   Essential hypertension, benign   Osteoarthritis of right knee   Hypothyroidism  Principal discharge diagnosis: New onset atrial fibrillation with RVR.  Discharge Instructions  Discharge Instructions    Diet - low sodium heart healthy   Complete by:  As directed    Increase activity slowly   Complete by:  As directed      Allergies as of 05/11/2018      Reactions   Bee Venom Anaphylaxis   Boniva [ibandronic Acid]    Upset stomach, flu like symptoms    Lisinopril Cough   Latex Rash      Medication List    STOP taking these medications   ibuprofen 800 MG tablet Commonly known as:  ADVIL,MOTRIN   verapamil 180 MG CR tablet Commonly known as:  CALAN-SR     TAKE these medications   apixaban 5 MG Tabs tablet Commonly known as:  ELIQUIS Take 1 tablet (5 mg total) by mouth 2 (two) times daily for 30 days.   CALCIUM-MAGNESIUM PO Take 2 tablets by mouth daily.   CoQ-10 200 MG Caps Take 200 mg by mouth daily.   diclofenac 75 MG EC tablet Commonly known as:  VOLTAREN Take 75 mg by mouth daily.   diltiazem 240 MG 24 hr capsule Commonly known as:  CARTIA XT Take 1 capsule (240 mg total) by mouth  daily.   Flax Seed Oil 1000 MG Caps Take 1,000 mg by mouth daily.   levocetirizine 5 MG tablet Commonly known as:  XYZAL Take 5 mg by mouth daily as needed for allergies.   Melatonin 5 MG Tabs Take 5 mg by mouth at bedtime as needed (sleep).   VALTREX 1000 MG tablet Generic drug:  valACYclovir Take 1,000 mg by mouth 2 (two) times daily. Take two tablets by mouth once daily for fever blisters   vitamin B-12 1000 MCG tablet Commonly known as:  CYANOCOBALAMIN Take 1,000 mcg by mouth daily.   Vitamin D 50 MCG (2000 UT) Caps Take 2,000 Units by  mouth daily.      Follow-up Information    Luking, Elayne Snare, MD Follow up in 3 day(s).   Specialty:  Family Medicine Contact information: [place] [address] [phone]        Herminio Commons, MD Follow up in 1 week(s).   Specialty:  Cardiology Contact information: [place] [id number] [phone]          Allergies  Allergen Reactions  . Bee Venom Anaphylaxis  . Boniva [Ibandronic Acid]     Upset stomach, flu like symptoms   . Lisinopril Cough  . Latex Rash    Consultations:  None   Procedures/Studies: Dg Chest 2 View  Result Date: 05/10/2018 CLINICAL DATA:  Sudden onset atrial fibrillation. Weakness. Shortness of breath. EXAM: CHEST - 2 VIEW COMPARISON:  10/10/2011. FINDINGS: Mediastinum and hilar structures normal. Cardiomegaly with normal pulmonary vascularity. No focal infiltrate. Small right pleural effusion can not be excluded. No pneumothorax. IMPRESSION: 1.  Cardiomegaly.  No pulmonary venous congestion. 2. No acute infiltrate. Small right pleural effusion can not be excluded. Electronically Signed   By: Marcello Moores  Register   On: 05/10/2018 13:53   Ct Abdomen Pelvis W Contrast  Result Date: 05/02/2018 CLINICAL DATA:  78 year old female with RIGHT-sided abdominal and pelvic pain for 4 days. EXAM: CT ABDOMEN AND PELVIS WITH CONTRAST TECHNIQUE: Multidetector CT imaging of the abdomen and pelvis was performed using the standard protocol following bolus administration of intravenous contrast. CONTRAST:  149mL ISOVUE-300 IOPAMIDOL (ISOVUE-300) INJECTION 61% COMPARISON:  None. FINDINGS: Lower chest: No acute abnormality.  Cardiomegaly identified. Hepatobiliary: The liver and gallbladder are unremarkable. No biliary dilatation. Pancreas: Unremarkable Spleen: Unremarkable Adrenals/Urinary Tract: The kidneys, adrenal glands and bladder are unremarkable except for mild areas of LEFT renal scarring. Stomach/Bowel: Stomach is  within normal limits. No evidence of bowel wall thickening, distention, or inflammatory changes. Vascular/Lymphatic: Aortic atherosclerosis. No enlarged abdominal or pelvic lymph nodes. Reproductive: Uterine masses are identified likely representing fibroids, with the largest measuring 3.5 x 4.5 cm on the LEFT. No adnexal masses are noted. Other: No ascites, focal collection or pneumoperitoneum. Musculoskeletal: No acute or suspicious bony abnormalities. IMPRESSION: 1. No evidence of acute abnormality 2. 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 3. Cardiomegaly 4.  Aortic Atherosclerosis (ICD10-I70.0). Electronically Signed   By: Margarette Canada M.D.   On: 05/02/2018 13:30      Discharge Exam: Vitals:   05/11/18 0700 05/11/18 0747  BP: 122/71   Pulse: (!) 58 70  Resp: 19 20  Temp:  97.7 F (36.5 C)  SpO2: 94% 98%   Vitals:   05/11/18 0600 05/11/18 0630 05/11/18 0700 05/11/18 0747  BP: 116/71 126/74 122/71   Pulse: 64 63 (!) 58 70  Resp: 20 (!)  21 19 20   Temp:    97.7 F (36.5 C)  TempSrc:    Oral  SpO2: 95% 97% 94% 98%  Weight:      Height:        General: Pt is alert, awake, not in acute distress Cardiovascular: RRR, S1/S2 +, no rubs, no gallops Respiratory: CTA bilaterally, no wheezing, no rhonchi Abdominal: Soft, NT, ND, bowel sounds + Extremities: no edema, no cyanosis    The results of significant diagnostics from this hospitalization (including imaging, microbiology, ancillary and laboratory) are listed below for reference.     Microbiology: Recent Results (from the past 240 hour(s))  MRSA PCR Screening     Status: None   Collection Time: 05/10/18  5:06 PM  Result Value Ref Range Status   MRSA by PCR NEGATIVE NEGATIVE Final    Comment:        The GeneXpert MRSA Assay (FDA approved for NASAL specimens only), is one component of a comprehensive MRSA colonization surveillance program. It is not intended to  diagnose MRSA infection nor to guide or monitor treatment for MRSA infections. Performed at [facility], [address] [place], [place] [address]      Labs: BNP (last 3 results) No results for input(s): BNP in the last 8760 hours. Basic Metabolic Panel: Recent Labs  Lab 05/10/18 1239 05/11/18 0438  NA 139 137  K 3.7 3.4*  CL 107 108  CO2 23 22  GLUCOSE 95 89  BUN 12 16  CREATININE 0.73 0.81  CALCIUM 9.0 8.5*  MG 2.2 2.3   Liver Function Tests: Recent Labs  Lab 05/10/18 1239  AST 22  ALT 33  ALKPHOS 74  BILITOT 0.9  PROT 7.7  ALBUMIN 4.3   No results for input(s): LIPASE, AMYLASE in the last 168 hours. No results for input(s): AMMONIA in the last 168 hours. CBC: Recent Labs  Lab 05/10/18 1239  WBC 6.2  NEUTROABS 3.6  HGB 12.5  HCT 39.3  MCV 98.0  PLT 241   Cardiac Enzymes: Recent Labs  Lab 05/10/18 1239  TROPONINI <0.03   BNP: Invalid input(s): POCBNP CBG: No results for input(s): GLUCAP in the last 168 hours. D-Dimer No results for input(s): DDIMER in the last 72 hours. Hgb A1c No results for input(s): HGBA1C in the last 72 hours. Lipid Profile No results for input(s): CHOL, HDL, LDLCALC, TRIG, CHOLHDL, LDLDIRECT in the last 72 hours. Thyroid function studies Recent Labs    05/10/18 1239  TSH 7.998*   Anemia work up No results for input(s): VITAMINB12, FOLATE, FERRITIN, TIBC, IRON, RETICCTPCT in the last 72 hours. Urinalysis    Component Value Date/Time   COLORURINE STRAW (A) 04/21/2017 [place] 04/21/2017 1253   LABSPEC 1.005 04/21/2017 1253   PHURINE 7.0 04/21/2017 1253   GLUCOSEU NEGATIVE 04/21/2017 1253   HGBUR NEGATIVE 04/21/2017 1253   BILIRUBINUR NEGATIVE 04/21/2017 1253   KETONESUR NEGATIVE 04/21/2017 1253   PROTEINUR NEGATIVE 04/21/2017 1253   UROBILINOGEN 0.2 11/10/2010 2001   NITRITE NEGATIVE 04/21/2017 1253   LEUKOCYTESUR NEGATIVE 04/21/2017 1253   Sepsis Labs Invalid input(s): PROCALCITONIN,   WBC,  LACTICIDVEN Microbiology Recent Results (from the past 240 hour(s))  MRSA PCR Screening     Status: None   Collection Time: 05/10/18  5:06 PM  Result Value Ref Range Status   MRSA by PCR NEGATIVE NEGATIVE Final    Comment:        The GeneXpert MRSA Assay (FDA approved for NASAL specimens  only), is one component of a comprehensive MRSA colonization surveillance program. It is not intended to diagnose MRSA infection nor to guide or monitor treatment for MRSA infections. Performed at [facility], [address], [place], [place] [address]      Time coordinating discharge: 35 minutes  SIGNED:   Rodena Goldmann, DO [facility] 05/11/2018, 7:50 AM  If 7PM-7AM, please contact night-coverage [email or web address] Password [email or web address]

## 2018-05-11 NOTE — Progress Notes (Signed)
Patient alert and oriented x4. No complaints of pain, shortness of breath, chest pain, dizziness, nausea or vomiting. Patient up out of bed independently with steady gait. Patient tolerated PO meds and diet well. Appetite good. IV's removed without complications. Pharmacist came to room and educated patient on eliquis and prescriptions along with giving a voucher. Discharge instructions, appointment follow ups and medication education provided to patient. Patient expressed full understanding of instructions, follow ups and education of medications. Patient discharged with all belongings for home via car. Friend here to take patient home.

## 2018-05-12 ENCOUNTER — Telehealth: Payer: Self-pay | Admitting: Family Medicine

## 2018-05-12 NOTE — Telephone Encounter (Signed)
Nurse's-patient recently discharged from the hospital. Please call patient, let them know that we are aware that they were discharged from the hospital. Please schedule them to follow-up with [place] within the next 7 days. Advised the patient to bring all of their medications with him to the visit. Please inquire if they are having any acute issues currently and documented accordingly.  Patient is in the hospital for atrial fibrillation-connect with patient have her follow-up later this week patient should bring her medications with her  If having any acute issues please bring those to my attention

## 2018-05-13 NOTE — Telephone Encounter (Signed)
Patient stated she stayed one night in the hospital and went home the next day and they changed her heart medicines.  Patient scheduled hospital follow up visit this week and will bring her medications to the visit.

## 2018-05-15 ENCOUNTER — Ambulatory Visit (INDEPENDENT_AMBULATORY_CARE_PROVIDER_SITE_OTHER): Payer: Medicare Other | Admitting: Family Medicine

## 2018-05-15 ENCOUNTER — Encounter: Payer: Self-pay | Admitting: Family Medicine

## 2018-05-15 VITALS — BP 168/80 | Temp 98.0°F | Ht 61.0 in | Wt 124.0 lb

## 2018-05-15 DIAGNOSIS — I480 Paroxysmal atrial fibrillation: Secondary | ICD-10-CM | POA: Diagnosis not present

## 2018-05-15 DIAGNOSIS — E876 Hypokalemia: Secondary | ICD-10-CM

## 2018-05-15 DIAGNOSIS — E039 Hypothyroidism, unspecified: Secondary | ICD-10-CM | POA: Diagnosis not present

## 2018-05-15 DIAGNOSIS — I4819 Other persistent atrial fibrillation: Secondary | ICD-10-CM | POA: Insufficient documentation

## 2018-05-15 DIAGNOSIS — I4891 Unspecified atrial fibrillation: Secondary | ICD-10-CM

## 2018-05-15 MED ORDER — LOSARTAN POTASSIUM 25 MG PO TABS: 25.0000 mg | ORAL_TABLET | Freq: Every day | ORAL | 5 refills | Status: DC

## 2018-05-15 NOTE — Progress Notes (Signed)
Urgent referral ordered in Epic. °

## 2018-05-15 NOTE — Addendum Note (Signed)
Addended by: Dairl Ponder on: 05/15/2018 01:34 PM   Modules accepted: Orders

## 2018-05-15 NOTE — Progress Notes (Signed)
   Subjective:    Patient ID: Hannah Roy, female    DOB: [birth year], 78 y.o.   MRN: [id number]  HPI Patient is here today to follow up on her recent hospitalization from 05/10/2018 due to atrial fibrillation. ER note, hospital notes, reviewed Per her discharge summary she is to follow up with pcp to check her blood pressure and have her Hypothyroidism addressed.  She was asked to stop taking the ibuprofen 800 mg and to discontinue the Verapamil 150 mg.  She was started on Eliquis 5 mg one bid, started on Diltiazem Er 240 mg once per day. She states the Eliquis 5 mg costs her 483 dollars with insurance.She states Jonni Sanger at [employer] gave it to her for free with his discount.I advised that she go on line to [facility] patient assistance program and if established with heart care they may have samples of eliquis they can give her.Also [email or web address].No cardiologist appt as of yet.  Review of Systems  Constitutional: Negative for activity change, fatigue and fever.  HENT: Negative for congestion and rhinorrhea.   Respiratory: Negative for cough, chest tightness and shortness of breath.   Cardiovascular: Negative for chest pain and leg swelling.  Gastrointestinal: Negative for abdominal pain and nausea.  Skin: Negative for color change.  Neurological: Negative for dizziness and headaches.  Psychiatric/Behavioral: Negative for agitation and behavioral problems.       Objective:   Physical Exam Vitals signs reviewed.  Constitutional:      General: She is not in acute distress. HENT:     Head: Normocephalic and atraumatic.  Eyes:     General:        Right eye: No discharge.        Left eye: No discharge.  Neck:     Trachea: No tracheal deviation.  Cardiovascular:     Rate and Rhythm: Normal rate and regular rhythm.     Heart sounds: Normal heart sounds. No murmur.  Pulmonary:     Effort: Pulmonary effort is normal. No respiratory distress.     Breath sounds: Normal breath  sounds.  Lymphadenopathy:     Cervical: No cervical adenopathy.  Skin:    General: Skin is warm and dry.  Neurological:     Mental Status: She is alert.     Coordination: Coordination normal.  Psychiatric:        Behavior: Behavior normal.    EKG looks normal currently       Assessment & Plan:  Continue diltiazem Blood pressure still elevated Start losartan 25 mg daily Follow-up 4 weeks  Referral to cardiology for intermittent atrial fibrillation Continue Eliquis Patient states she will have difficulty paying for this May want to consider Coumadin but we will leave this up to cardiology  Recheck thyroid function may need to start levothyroxine at a low dose await the results  Hypokalemia follow-up on that

## 2018-05-16 ENCOUNTER — Encounter: Payer: Self-pay | Admitting: Family Medicine

## 2018-05-16 LAB — T4, FREE: Free T4: 1.23 ng/dL (ref 0.82–1.77)

## 2018-05-16 LAB — BASIC METABOLIC PANEL
BUN/Creatinine Ratio: 19 (ref 12–28)
BUN: 14 mg/dL (ref 8–27)
CO2: 22 mmol/L (ref 20–29)
Calcium: 9.7 mg/dL (ref 8.7–10.3)
Chloride: 104 mmol/L (ref 96–106)
Creatinine, Ser: 0.75 mg/dL (ref 0.57–1.00)
GFR calc Af Amer: 90 mL/min/{1.73_m2} (ref >59)
GFR calc non Af Amer: 78 mL/min/{1.73_m2} (ref >59)
Glucose: 93 mg/dL (ref 65–99)
Potassium: 4 mmol/L (ref 3.5–5.2)
Sodium: 142 mmol/L (ref 134–144)

## 2018-05-16 LAB — TSH: TSH: 3.44 u[IU]/mL (ref 0.450–4.500)

## 2018-05-17 ENCOUNTER — Encounter: Payer: Self-pay | Admitting: Cardiovascular Disease

## 2018-05-17 ENCOUNTER — Ambulatory Visit (INDEPENDENT_AMBULATORY_CARE_PROVIDER_SITE_OTHER): Payer: Medicare Other | Admitting: Cardiovascular Disease

## 2018-05-17 ENCOUNTER — Encounter: Payer: Self-pay | Admitting: Family Medicine

## 2018-05-17 VITALS — BP 142/84 | HR 61 | Ht 61.0 in | Wt 124.0 lb

## 2018-05-17 DIAGNOSIS — I38 Endocarditis, valve unspecified: Secondary | ICD-10-CM | POA: Diagnosis not present

## 2018-05-17 DIAGNOSIS — Z9289 Personal history of other medical treatment: Secondary | ICD-10-CM | POA: Diagnosis not present

## 2018-05-17 DIAGNOSIS — I77810 Thoracic aortic ectasia: Secondary | ICD-10-CM

## 2018-05-17 DIAGNOSIS — I351 Nonrheumatic aortic (valve) insufficiency: Secondary | ICD-10-CM

## 2018-05-17 DIAGNOSIS — I10 Essential (primary) hypertension: Secondary | ICD-10-CM | POA: Diagnosis not present

## 2018-05-17 DIAGNOSIS — I4891 Unspecified atrial fibrillation: Secondary | ICD-10-CM | POA: Diagnosis not present

## 2018-05-17 DIAGNOSIS — I340 Nonrheumatic mitral (valve) insufficiency: Secondary | ICD-10-CM | POA: Diagnosis not present

## 2018-05-17 DIAGNOSIS — I350 Nonrheumatic aortic (valve) stenosis: Secondary | ICD-10-CM

## 2018-05-17 NOTE — Patient Instructions (Signed)
Medication Instructions:  Your physician recommends that you continue on your current medications as directed. Please refer to the Current Medication list given to you today.  If you need a refill on your cardiac medications before your next appointment, please call your pharmacy.   Lab work: none If you have labs (blood work) drawn today and your tests are completely normal, you will receive your results only by: [name] MyChart Message (if you have MyChart) OR . A paper copy in the mail If you have any lab test that is abnormal or we need to change your treatment, we will call you to review the results.  Testing/Procedures:please schedule your chest CT angiogram  Follow-Up: At [facility], you and your health needs are our priority.  As part of our continuing mission to provide you with exceptional heart care, we have created designated Provider Care Teams.  These Care Teams include your primary Cardiologist (physician) and Advanced Practice Providers (APPs -  Physician Assistants and Nurse Practitioners) who all work together to provide you with the care you need, when you need it. You will need a follow up appointment in 3 months.  Please call our office 2 months in advance to schedule this appointment.  You may see Kate Sable, MD or one of the following Advanced Practice Providers on your designated Care Team:   Bernerd Pho, PA-C [facility]) . Ermalinda Barrios, PA-C ([place])  Any Other Special Instructions Will Be Listed Below (If Applicable). None

## 2018-05-17 NOTE — Progress Notes (Signed)
CARDIOLOGY CONSULT NOTE  Patient ID: Hannah Roy MRN: [id number] DOB/AGE: [birth year] 78 y.o.  Admit date: (Not on file) Primary Physician: Kathyrn Drown, MD Referring Physician: Kathyrn Drown, MD  Reason for Consultation: New onset atrial fibrillation  HPI: Hannah Roy is a 78 y.o. female who is being seen today for the evaluation of new onset atrial fibrillation at the request of Luking, Elayne Snare, MD.   Past medical history includes hypertension and osteoarthritis.  She was recently hospitalized for new onset rapid atrial fibrillation and was placed on an IV diltiazem infusion which was subsequently transitioned to oral diltiazem.  She was started on Eliquis for anticoagulation.  TSH was noted to be elevated at 7.9.  Subsequent TSH on 05/15/2018 was normal at 3.44.  Echocardiogram demonstrated normal left ventricular systolic function, LVEF 60 to 65%.  There was moderate focal basal septal hypertrophy.  There was severe left atrial and moderate right atrial dilatation.  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  There was moderate to severe aortic regurgitation and mild aortic stenosis.  The aortic root was mildly dilated.  Chest x-ray showed cardiomegaly but no pulmonary venous congestion.  Small right pleural effusion could not be excluded but there were no acute infiltrates.  She is married and her husband has bladder cancer and they have to make frequent trips to [place] and [facility].  This has created a lot of stress for her.  She is doing fairly well overall.  She had some mild shortness of breath today but this has since resolved.  She denies palpitations and chest pain.  She denies leg swelling, orthopnea, and paroxysmal nocturnal dyspnea.  Her initial cost for Eliquis was $483.  She request samples.  Social history: She is originally from [place].  [place] age [place] 26.  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  She has an adult daughter who lives in [place] who has 3 sons.   Allergies  Allergen Reactions  . Bee Venom Anaphylaxis  . Boniva [Ibandronic Acid]     Upset stomach, flu like symptoms   . Lisinopril Cough  . Latex Rash    Current Outpatient Medications  Medication Sig Dispense Refill  . apixaban (ELIQUIS) 5 MG TABS tablet Take 1 tablet (5 mg total) by mouth 2 (two) times daily for 30 days. 60 tablet 3  . CALCIUM-MAGNESIUM PO Take 2 tablets by mouth daily.    . Cholecalciferol (VITAMIN D) 2000 units CAPS Take 2,000 Units by mouth daily.    . Coenzyme Q10 (COQ-10) 200 MG CAPS Take 200 mg by mouth daily.    . diclofenac (VOLTAREN) 75 MG EC tablet Take 75 mg by mouth daily.    [name] diltiazem (CARTIA XT) 240 MG 24 hr capsule Take 1 capsule (240 mg total) by mouth daily. 30 capsule 11  . Flaxseed, Linseed, (FLAX SEED OIL) 1000 MG CAPS Take 1,000 mg by mouth daily.    [name] levocetirizine (XYZAL) 5 MG tablet Take 5 mg by mouth daily as needed for allergies.    [name] losartan (COZAAR) 25 MG tablet Take 1 tablet (25 mg total) by mouth daily. 30 tablet 5  . Melatonin 5 MG TABS Take 5 mg by mouth at bedtime as needed (sleep).    . valACYclovir (VALTREX) 1000 MG tablet Take  1,000 mg by mouth 2 (two) times daily. Take two tablets by mouth once daily for fever blisters    . vitamin B-12 (CYANOCOBALAMIN) 1000 MCG tablet Take 1,000 mcg by mouth daily.     No current facility-administered medications for this visit.     Past Medical History:  Diagnosis Date  . Aortic atherosclerosis ([place]) 05/07/2018  . Atrial fibrillation ([place]) 05/15/2018  . Colon polyps   . History of seasonal allergies   . Hypertension   . Osteopenia 2006    Past Surgical History:  Procedure Laterality Date  . APPENDECTOMY    . COLONOSCOPY  03/23/2011   repeat in 5 years,Procedure: COLONOSCOPY;  Surgeon: Rogene Houston, MD;  Location: [facility];   Service: Endoscopy;  Laterality: N/A;  1:00  . COLONOSCOPY N/A 11/09/2016   Procedure: COLONOSCOPY;  Surgeon: Rogene Houston, MD;  Location: [facility];  Service: Endoscopy;  Laterality: N/A;  1030  . TUBAL LIGATION      Social History   Socioeconomic History  . Marital status: Married    Spouse name: Not on file  . Number of children: Not on file  . Years of education: Not on file  . Highest education level: Not on file  Occupational History  . Not on file  Social Needs  . Financial resource strain: Not on file  . Food insecurity:    Worry: Not on file    Inability: Not on file  . Transportation needs:    Medical: Not on file    Non-medical: Not on file  Tobacco Use  . Smoking status: Former Smoker    Packs/day: 0.50    Years: 10.00    Pack years: 5.00  . Smokeless tobacco: Never Used  Substance and Sexual Activity  . Alcohol use: No  . Drug use: No  . Sexual activity: Not on file  Lifestyle  . Physical activity:    Days per week: Not on file    Minutes per session: Not on file  . Stress: Not on file  Relationships  . Social connections:    Talks on phone: Not on file    Gets together: Not on file    Attends religious service: Not on file    Active member of club or organization: Not on file    Attends meetings of clubs or organizations: Not on file    Relationship status: Not on file  . Intimate partner violence:    Fear of current or ex partner: Not on file    Emotionally abused: Not on file    Physically abused: Not on file    Forced sexual activity: Not on file  Other Topics Concern  . Not on file  Social History Narrative  . Not on file     No family history of premature CAD in 1st degree relatives.  Current Meds  Medication Sig  . apixaban (ELIQUIS) 5 MG TABS tablet Take 1 tablet (5 mg total) by mouth 2 (two) times daily for 30 days.  [name] CALCIUM-MAGNESIUM PO Take 2 tablets by mouth daily.  . Cholecalciferol (VITAMIN D) 2000 units CAPS Take  2,000 Units by mouth daily.  . Coenzyme Q10 (COQ-10) 200 MG CAPS Take 200 mg by mouth daily.  . diclofenac (VOLTAREN) 75 MG EC tablet Take 75 mg by mouth daily.  [name] diltiazem (CARTIA XT) 240 MG 24 hr capsule Take 1 capsule (240 mg total) by mouth daily.  . Flaxseed, Linseed, (FLAX SEED OIL) 1000 MG  CAPS Take 1,000 mg by mouth daily.  [name] levocetirizine (XYZAL) 5 MG tablet Take 5 mg by mouth daily as needed for allergies.  [name] losartan (COZAAR) 25 MG tablet Take 1 tablet (25 mg total) by mouth daily.  . Melatonin 5 MG TABS Take 5 mg by mouth at bedtime as needed (sleep).  . valACYclovir (VALTREX) 1000 MG tablet Take 1,000 mg by mouth 2 (two) times daily. Take two tablets by mouth once daily for fever blisters  . vitamin B-12 (CYANOCOBALAMIN) 1000 MCG tablet Take 1,000 mcg by mouth daily.      Review of systems complete and found to be negative unless listed above in HPI    Physical exam Blood pressure (!) 142/84, pulse 61, height 5\' 1"  (1.549 m), weight 124 lb (56.2 kg), SpO2 97 %. General: NAD Neck: No JVD, no thyromegaly or thyroid nodule.  Lungs: Clear to auscultation bilaterally with normal respiratory effort. CV: Nondisplaced PMI. Regular rate and rhythm, normal S1/S2, no O9/G2, 3/6 systolic murmur over bilateral upper sternal borders and 2/4 holodiastolic murmur along left sternal border.  No peripheral edema.  No carotid bruit.    Abdomen: Soft, nontender, no distention.  Skin: Intact without lesions or rashes.  Neurologic: Alert and oriented x 3.  Psych: Normal affect. Extremities: No clubbing or cyanosis.  HEENT: Normal.   ECG: Most recent ECG reviewed.   Labs: Lab Results  Component Value Date/Time   K 4.0 05/15/2018 11:22 AM   BUN 14 05/15/2018 11:22 AM   CREATININE 0.75 05/15/2018 11:22 AM   CREATININE 0.66 05/28/2014 10:07 AM   ALT 33 05/10/2018 12:39 PM   TSH 3.440 05/15/2018 11:22 AM   HGB 12.5 05/10/2018 12:39 PM   HGB 12.4 01/24/2017 11:52 AM     Lipids: Lab  Results  Component Value Date/Time   LDLCALC 110 (H) 08/03/2017 09:54 AM   CHOL 195 08/03/2017 09:54 AM   TRIG 107 08/03/2017 09:54 AM   HDL 64 08/03/2017 09:54 AM        ASSESSMENT AND PLAN:  1.  New onset rapid atrial fibrillation: Symptomatically stable.  Currently on long-acting diltiazem 240 mg daily.  Systemically anticoagulated with Eliquis 5 mg twice daily.  I will provide samples.  For the time being continue Eliquis.  2.  Valvular heart disease: Echocardiogram results detailed above with mild to moderate mitral regurgitation, moderate to severe aortic regurgitation, and mild aortic stenosis.  I will monitor.  3.  Hypertension: Blood pressure is mildly elevated.  Currently on losartan 25 mg daily.  This will need further monitoring.  4.  Aortic root dilatation: She also has moderate to severe aortic regurgitation.  I will obtain CT angiography of the chest to assess for additional aortic pathology.   Disposition: Follow up in 3 months  Signed: Kate Sable, M.D., F.A.C.C.  05/17/2018, 10:24 AM

## 2018-05-30 ENCOUNTER — Ambulatory Visit (HOSPITAL_COMMUNITY): Payer: Medicare Other

## 2018-05-31 ENCOUNTER — Ambulatory Visit (HOSPITAL_COMMUNITY): Payer: Medicare Other

## 2018-06-07 ENCOUNTER — Telehealth: Payer: Self-pay | Admitting: Family Medicine

## 2018-06-07 ENCOUNTER — Other Ambulatory Visit: Payer: Self-pay | Admitting: Family Medicine

## 2018-06-07 MED ORDER — MECLIZINE HCL 12.5 MG PO TABS: 12.5000 mg | ORAL_TABLET | Freq: Three times a day (TID) | ORAL | 0 refills | Status: DC | PRN

## 2018-06-07 MED ORDER — MECLIZINE HCL 25 MG PO TABS: ORAL_TABLET | ORAL | 0 refills | Status: DC

## 2018-06-07 NOTE — Telephone Encounter (Signed)
Patient notified and verbalized understanding. 

## 2018-06-07 NOTE — Telephone Encounter (Signed)
Patient calling because she is a little dizzy this morning.  She still has a few Meclizine pills left and wants to know if she can take one with the current heart meds she is on now?

## 2018-06-07 NOTE — Telephone Encounter (Signed)
Patient advised She may use meclizine on a as needed basis but do not use it frequently If she needs a refill please send it in If she has ongoing trouble next week she should call and do a follow-up visit with [place] Patient states she has her normal appt next week and does not need a refill but just wanted to make sure she could take it with her new meds.

## 2018-06-07 NOTE — Telephone Encounter (Signed)
She may use meclizine on a as needed basis but do not use it frequently If she needs a refill please send it in If she has ongoing trouble next week she should call and do a follow-up visit with [place]

## 2018-06-07 NOTE — Telephone Encounter (Signed)
Please advise. Thank you

## 2018-06-07 NOTE — Telephone Encounter (Signed)
Patient called back and stated she does need a new script for meclazine

## 2018-06-07 NOTE — Telephone Encounter (Signed)
Meclizine was sent into [facility] meclizine 25 mg 1 3 times daily as needed dizziness If the 25 mg makes her feel too drowsy she can split into half and do a half a tablet 3 times daily as needed

## 2018-06-07 NOTE — Addendum Note (Signed)
Addended by: Dairl Ponder on: 06/07/2018 04:56 PM   Modules accepted: Orders

## 2018-06-11 ENCOUNTER — Ambulatory Visit (HOSPITAL_COMMUNITY)
Admission: RE | Admit: 2018-06-11 | Discharge: 2018-06-11 | Disposition: A | Discharge status: Home / Self Care | Payer: Medicare Other | Source: Ambulatory Visit | Attending: Cardiovascular Disease | Admitting: Cardiovascular Disease

## 2018-06-11 DIAGNOSIS — I77810 Thoracic aortic ectasia: Secondary | ICD-10-CM | POA: Diagnosis not present

## 2018-06-11 DIAGNOSIS — I712 Thoracic aortic aneurysm, without rupture: Secondary | ICD-10-CM | POA: Diagnosis not present

## 2018-06-11 MED ORDER — IOHEXOL 350 MG/ML SOLN
100.0000 mL | Freq: Once | INTRAVENOUS | Status: AC | PRN
  Administered 2018-06-11: 100 mL via INTRAVENOUS

## 2018-06-12 ENCOUNTER — Encounter: Payer: Self-pay | Admitting: Family Medicine

## 2018-06-12 ENCOUNTER — Ambulatory Visit (INDEPENDENT_AMBULATORY_CARE_PROVIDER_SITE_OTHER): Payer: Medicare Other | Admitting: Family Medicine

## 2018-06-12 ENCOUNTER — Telehealth: Payer: Self-pay | Admitting: *Deleted

## 2018-06-12 VITALS — BP 126/72 | Ht 61.0 in | Wt 125.0 lb

## 2018-06-12 DIAGNOSIS — I10 Essential (primary) hypertension: Secondary | ICD-10-CM

## 2018-06-12 DIAGNOSIS — I480 Paroxysmal atrial fibrillation: Secondary | ICD-10-CM | POA: Diagnosis not present

## 2018-06-12 DIAGNOSIS — Z7901 Long term (current) use of anticoagulants: Secondary | ICD-10-CM

## 2018-06-12 DIAGNOSIS — Z13220 Encounter for screening for lipoid disorders: Secondary | ICD-10-CM | POA: Diagnosis not present

## 2018-06-12 NOTE — Progress Notes (Signed)
   Subjective:    Patient ID: Hannah Roy, female    DOB: 1940-07-19, 78 y.o.   MRN: [id number]  HPI  Patient is here today to follow up on her recent rapid atrial fibrillation.  She states she is taking Eliquis 5 mg one bid. She states she has been getting samples of this from Dr. [name].  She is also taking Diltiazem 240 mg once per day, Losartan 25 mg once per day.  She states she had a Ct chest done and ordered by Dr. [name] . He has not called her with the results as of yet.  She states she had had some dizziness last week and we called in Meclizine 25 mg 1/2 -1 three times per days prn.Thinks maybe allergies.  She states over all she is feeling better.  Review of Systems  Constitutional: Negative for activity change and appetite change.  HENT: Negative for congestion and rhinorrhea.   Respiratory: Negative for cough and shortness of breath.   Cardiovascular: Negative for chest pain and leg swelling.  Gastrointestinal: Negative for abdominal pain, nausea and vomiting.  Skin: Negative for color change.  Neurological: Negative for dizziness and weakness.  Psychiatric/Behavioral: Negative for agitation and confusion.       Objective:   Physical Exam Vitals signs reviewed.  Constitutional:      General: She is not in acute distress. HENT:     Head: Normocephalic.  Cardiovascular:     Rate and Rhythm: Normal rate.     Heart sounds: Normal heart sounds. No murmur.  Pulmonary:     Effort: Pulmonary effort is normal.     Breath sounds: Normal breath sounds.  Lymphadenopathy:     Cervical: No cervical adenopathy.  Neurological:     Mental Status: She is alert.  Psychiatric:        Behavior: Behavior normal.           Assessment & Plan:  Intermittent dizziness none today blood pressure good continue current measures  Recent CT scan because of dilated aortic root hopefully she will need to have surgery this will be followed just on a yearly basis patient is  anxious about what is going on with her Reassurance was given to her that more than likely this will just need to be looked at again in a year cardiology will call her regarding this She will follow-up again in 3 to 4 months sooner problems

## 2018-06-12 NOTE — Telephone Encounter (Signed)
-----   Message from Herminio Commons, MD sent at 06/12/2018  8:40 AM EST ----- Mild ascending thoracic aortic aneurysm.  Repeat study in 1 year.

## 2018-06-12 NOTE — Telephone Encounter (Signed)
Called patient with test results. No answer. Left message to call back.  

## 2018-08-09 ENCOUNTER — Other Ambulatory Visit: Payer: Self-pay

## 2018-08-09 ENCOUNTER — Telehealth: Payer: Self-pay | Admitting: Family Medicine

## 2018-08-09 ENCOUNTER — Emergency Department (HOSPITAL_COMMUNITY)
Admission: EM | Admit: 2018-08-09 | Discharge: 2018-08-09 | Disposition: A | Discharge status: Home / Self Care | Payer: Medicare Other | Attending: Emergency Medicine | Admitting: Emergency Medicine

## 2018-08-09 ENCOUNTER — Encounter (HOSPITAL_COMMUNITY): Payer: Self-pay

## 2018-08-09 DIAGNOSIS — Z91040 Latex allergy status: Secondary | ICD-10-CM | POA: Diagnosis not present

## 2018-08-09 DIAGNOSIS — I10 Essential (primary) hypertension: Secondary | ICD-10-CM | POA: Diagnosis not present

## 2018-08-09 DIAGNOSIS — Z7901 Long term (current) use of anticoagulants: Secondary | ICD-10-CM | POA: Diagnosis not present

## 2018-08-09 DIAGNOSIS — Z87891 Personal history of nicotine dependence: Secondary | ICD-10-CM | POA: Insufficient documentation

## 2018-08-09 DIAGNOSIS — Z79899 Other long term (current) drug therapy: Secondary | ICD-10-CM | POA: Diagnosis not present

## 2018-08-09 DIAGNOSIS — R002 Palpitations: Secondary | ICD-10-CM | POA: Diagnosis present

## 2018-08-09 DIAGNOSIS — I480 Paroxysmal atrial fibrillation: Secondary | ICD-10-CM

## 2018-08-09 DIAGNOSIS — E039 Hypothyroidism, unspecified: Secondary | ICD-10-CM | POA: Diagnosis not present

## 2018-08-09 LAB — CBC WITH DIFFERENTIAL/PLATELET
Abs Immature Granulocytes: 0.02 10*3/uL (ref 0.00–0.07)
Basophils Absolute: 0 10*3/uL (ref 0.0–0.1)
Basophils Relative: 1 %
Eosinophils Absolute: 0.2 10*3/uL (ref 0.0–0.5)
Eosinophils Relative: 2 %
HCT: 44.7 % (ref 36.0–46.0)
Hemoglobin: 14.5 g/dL (ref 12.0–15.0)
Immature Granulocytes: 0 %
Lymphocytes Relative: 31 %
Lymphs Abs: 2.1 10*3/uL (ref 0.7–4.0)
MCH: 31 pg (ref 26.0–34.0)
MCHC: 32.4 g/dL (ref 30.0–36.0)
MCV: 95.5 fL (ref 80.0–100.0)
Monocytes Absolute: 0.6 10*3/uL (ref 0.1–1.0)
Monocytes Relative: 8 %
Neutro Abs: 3.8 10*3/uL (ref 1.7–7.7)
Neutrophils Relative %: 58 %
Platelets: 268 10*3/uL (ref 150–400)
RBC: 4.68 MIL/uL (ref 3.87–5.11)
RDW: 12 % (ref 11.5–15.5)
WBC: 6.7 10*3/uL (ref 4.0–10.5)
nRBC: 0 % (ref 0.0–0.2)

## 2018-08-09 LAB — BASIC METABOLIC PANEL
Anion gap: 11 (ref 5–15)
BUN: 16 mg/dL (ref 8–23)
CO2: 23 mmol/L (ref 22–32)
Calcium: 9.2 mg/dL (ref 8.9–10.3)
Chloride: 107 mmol/L (ref 98–111)
Creatinine, Ser: 0.77 mg/dL (ref 0.44–1.00)
GFR calc Af Amer: >60 mL/min (ref >60)
GFR calc non Af Amer: >60 mL/min (ref >60)
Glucose, Bld: 108 mg/dL — ABNORMAL HIGH (ref 70–99)
Potassium: 3.5 mmol/L (ref 3.5–5.1)
Sodium: 141 mmol/L (ref 135–145)

## 2018-08-09 LAB — TROPONIN I: Troponin I: <0.03 ng/mL (ref <0.03)

## 2018-08-09 MED ORDER — PROPOFOL 10 MG/ML IV BOLUS
0.5000 mg/kg | Freq: Once | INTRAVENOUS | Status: AC
  Administered 2018-08-09: 40 mg via INTRAVENOUS
  Filled 2018-08-09: qty 20

## 2018-08-09 MED ORDER — PROPOFOL 10 MG/ML IV BOLUS
0.5000 mg/kg | Freq: Once | INTRAVENOUS | Status: AC
  Administered 2018-08-09: 20 mg via INTRAVENOUS

## 2018-08-09 MED ORDER — PROPOFOL 10 MG/ML IV BOLUS
0.5000 mg/kg | Freq: Once | INTRAVENOUS | Status: AC
  Administered 2018-08-09: 12:00:00 20 mg via INTRAVENOUS

## 2018-08-09 MED ORDER — DILTIAZEM HCL 25 MG/5ML IV SOLN
10.0000 mg | Freq: Once | INTRAVENOUS | Status: AC
  Administered 2018-08-09: 10 mg via INTRAVENOUS
  Filled 2018-08-09: qty 5

## 2018-08-09 NOTE — ED Notes (Signed)
ED Provider at bedside. 

## 2018-08-09 NOTE — ED Triage Notes (Signed)
Pt reports chest discomfort, sob, and feeling like heart is racing since yesterday.  Reports history of afib.  Denies cough, fever.

## 2018-08-09 NOTE — Sedation Documentation (Signed)
ED Provider at bedside. 

## 2018-08-09 NOTE — Sedation Documentation (Signed)
Vital signs stable. 

## 2018-08-09 NOTE — ED Notes (Signed)
Ambulated in hallway without difficulty.  No c/o pain or discomfort.

## 2018-08-09 NOTE — Telephone Encounter (Signed)
Pt contacted office. Pt states she is having same symptoms as she was having in January. Pt was seen on 05/10/2018 for DOE. Pt was then sent to ER for further evaluation. Pt states that yesterday she began having shortness of breath, elevated heart rate, and not feeling good. Pt states that this morning she took her medication and feels a little better. Pt states she is still have shortness of breath, not feeling well, and having an uncomfortable feeling in chest. Spoke with Dr.Steve since Dr.Scott is not in office; Dr.Steve advised for patient to go to ED for further evaluation. Pt advised to go to ED; pt verbalized understanding. [facility] and let them know that patient was coming.

## 2018-08-09 NOTE — Discharge Instructions (Signed)
Continue medications as previously prescribed.  Follow-up with your cardiologist in 2 weeks as scheduled, and return to the ER if symptoms significantly worsen or change.

## 2018-08-09 NOTE — ED Provider Notes (Signed)
[facility] EMERGENCY DEPARTMENT Provider Note   CSN: [id number] Arrival date & time: 08/09/18  1005    History   Chief Complaint Chief Complaint  Patient presents with   Chest Pain    HPI Sentoria AFUA HOOTS is a 78 y.o. female.     Patient is a 78 year old female with past medical history of hypertension and paroxysmal atrial fibrillation.  She was admitted 3 months ago for her first episode of A. Fib.  She was started on Cardizem and Eliquis and successfully returned to a sinus rhythm.  Yesterday evening she started with palpitations and lightheadedness with ambulation.  These symptoms are similar to what she experienced with A. fib in the past.  She denies any chest pain but does feel somewhat short of breath.  She denies any fevers, chills, or cough.  The history is provided by the patient.  Palpitations  Palpitations quality:  Irregular Onset quality:  Sudden Duration:  12 hours Timing:  Constant Progression:  Unchanged Chronicity:  Recurrent Context: not caffeine   Relieved by:  Nothing Worsened by:  Nothing Ineffective treatments:  None tried   Past Medical History:  Diagnosis Date   Aortic atherosclerosis ([place]) 05/07/2018   Atrial fibrillation ([place]) 05/15/2018   Colon polyps    History of seasonal allergies    Hypertension    Osteopenia 2006    Patient Active Problem List   Diagnosis Date Noted   Atrial fibrillation ([place]) 05/15/2018   Atrial fibrillation with RVR ([place]) 05/10/2018   Hypothyroidism 05/10/2018   Aortic atherosclerosis ([place]) 05/07/2018   History of colonic polyps 08/03/2016   Osteoarthritis of right knee 10/17/2013   Essential hypertension, benign 07/30/2012   Allergic rhinitis 07/30/2012   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      Home Medications    Prior to Admission medications   Medication Sig Start Date End Date Taking? Authorizing Provider  apixaban (ELIQUIS) 5 MG TABS tablet Take 1 tablet (5 mg total) by mouth 2 (two) times daily for 30 days. 05/11/18 06/10/18  Manuella Ghazi, Pratik D, DO  CALCIUM-MAGNESIUM PO Take 2 tablets by mouth daily.    [provider]  Cholecalciferol (VITAMIN D) 2000 units CAPS Take 2,000 Units by mouth daily.    [provider]  Coenzyme Q10 (COQ-10) 200 MG CAPS Take 200 mg by mouth daily.    [provider]  diclofenac (VOLTAREN) 75 MG EC tablet Take 75 mg by mouth daily.    [provider]  diltiazem (CARTIA XT) 240 MG 24 hr capsule Take 1 capsule (240 mg total) by mouth daily. 05/11/18 05/11/19  Manuella Ghazi, Pratik D, DO  Flaxseed, Linseed, (FLAX SEED OIL) 1000 MG CAPS Take 1,000 mg by mouth daily.    [provider]  levocetirizine (XYZAL) 5 MG tablet Take 5 mg by mouth daily as needed for allergies.    [provider]  losartan (COZAAR) 25 MG tablet Take 1 tablet (  25 mg total) by mouth daily. 05/15/18   Kathyrn Drown, MD  meclizine (ANTIVERT) 25 MG tablet One half to one 3 times daily as needed dizziness 06/07/18   Kathyrn Drown, MD  Melatonin 5 MG TABS Take 5 mg by mouth at bedtime as needed (sleep).    [provider]  valACYclovir (VALTREX) 1000 MG tablet Take 1,000 mg by mouth 2 (two) times daily. Take two tablets by mouth once daily for fever blisters    [provider]  vitamin B-12 (CYANOCOBALAMIN) 1000 MCG tablet Take 1,000 mcg by mouth daily.    [provider]    Family History Family History  Problem Relation  Age of Onset   Colon cancer Daughter     Social History Social History   Tobacco Use   Smoking status: Former Smoker    Packs/day: 0.50    Years: 10.00    Pack years: 5.00   Smokeless tobacco: Never Used  Substance Use Topics   Alcohol use: No   Drug use: No     Allergies   Bee venom; Boniva [ibandronic acid]; Lisinopril; and Latex   Review of Systems Review of Systems  Cardiovascular: Positive for palpitations.  All other systems reviewed and are negative.    Physical Exam Updated Vital Signs BP 121/90 (BP Location: Right Arm)    Pulse (!) 124    Temp 97.9 F (36.6 C) (Oral)    Resp (!) 22    Ht 5\' 1"  (1.549 m)    Wt 56.7 kg    SpO2 99%    BMI 23.62 kg/m   Physical Exam Vitals signs and nursing note reviewed.  Constitutional:      General: She is not in acute distress.    Appearance: She is well-developed. She is not diaphoretic.  HENT:     Head: Normocephalic and atraumatic.  Neck:     Musculoskeletal: Normal range of motion and neck supple.  Cardiovascular:     Rate and Rhythm: Tachycardia present. Rhythm irregular.     Heart sounds: No murmur. No friction rub. No gallop.   Pulmonary:     Effort: Pulmonary effort is normal. No respiratory distress.     Breath sounds: Normal breath sounds. No wheezing.  Abdominal:     General: Bowel sounds are normal. There is no distension.     Palpations: Abdomen is soft.     Tenderness: There is no abdominal tenderness.  Musculoskeletal: Normal range of motion.     Right lower leg: She exhibits no tenderness. No edema.     Left lower leg: She exhibits no tenderness. No edema.  Skin:    General: Skin is warm and dry.  Neurological:     Mental Status: She is alert and oriented to person, place, and time.      ED Treatments / Results  Labs (all labs ordered are listed, but only abnormal results are displayed) Labs Reviewed  BASIC METABOLIC PANEL  CBC WITH DIFFERENTIAL/PLATELET  TROPONIN I    EKG EKG  Interpretation  Date/Time:  Friday Aug 09 2018 10:16:59 EDT Ventricular Rate:  122 PR Interval:    QRS Duration: 81 QT Interval:  322 QTC Calculation: 459 R Axis:   22 Text Interpretation:  Atrial fibrillation Anterior infarct, old Repol abnrm suggests ischemia, diffuse leads Confirmed by Veryl Speak [phone]) on 08/09/2018 10:19:38 AM   Radiology No results found.  Procedures .Sedation Date/Time: 08/09/2018 12:52 PM Performed by: Veryl Speak, MD Authorized by: Stark Jock  Nathaneil Canary, MD   Consent:    Consent obtained:  Verbal and written   Consent given by:  Patient   Risks discussed:  Allergic reaction, dysrhythmia, inadequate sedation, nausea, prolonged hypoxia resulting in organ damage, prolonged sedation necessitating reversal, respiratory compromise necessitating ventilatory assistance and intubation and vomiting   Alternatives discussed:  Analgesia without sedation, anxiolysis and regional anesthesia Universal protocol:    Procedure explained and questions answered to patient or proxy's satisfaction: yes     Relevant documents present and verified: yes     Test results available and properly labeled: yes     Imaging studies available: yes     Required blood products, implants, devices, and special equipment available: yes     Site/side marked: yes     Immediately prior to procedure a time out was called: yes     Patient identity confirmation method:  Verbally with patient, arm band and hospital-assigned identification number Indications:    Procedure necessitating sedation performed by:  Physician performing sedation Pre-sedation assessment:    Time since last food or drink:  4 hours   ASA classification: class 1 - normal, healthy patient     Neck mobility: normal     Mouth opening:  3 or more finger widths   Thyromental distance:  4 finger widths   Mallampati score:  I - soft palate, uvula, fauces, pillars visible   Pre-sedation assessments completed and reviewed: airway  patency, cardiovascular function, hydration status, mental status, nausea/vomiting, pain level, respiratory function and temperature   Immediate pre-procedure details:    Reassessment: Patient reassessed immediately prior to procedure     Reviewed: vital signs, relevant labs/tests and NPO status     Verified: bag valve mask available, emergency equipment available, intubation equipment available, IV patency confirmed, oxygen available and suction available   Procedure details (see MAR for exact dosages):    Preoxygenation:  Nasal cannula   Sedation:  Propofol   Intra-procedure monitoring:  Blood pressure monitoring, cardiac monitor, continuous pulse oximetry, frequent LOC assessments, frequent vital sign checks and continuous capnometry   Intra-procedure events: none     Total Provider sedation time (minutes):  10 Post-procedure details:    Attendance: Constant attendance by certified staff until patient recovered     Recovery: Patient returned to pre-procedure baseline     Post-sedation assessments completed and reviewed: airway patency, cardiovascular function, hydration status, mental status, nausea/vomiting, pain level, respiratory function and temperature     Patient is stable for discharge or admission: yes     Patient tolerance:  Tolerated well, no immediate complications Comments:     Patient successfully cardioverted to a sinus rhythm using 100 J of electricity.  Patient tolerated the procedure well.   (including critical care time)  Medications Ordered in ED Medications  diltiazem (CARDIZEM) injection 10 mg (has no administration in time range)     Initial Impression / Assessment and Plan / ED Course  I have reviewed the triage vital signs and the nursing notes.  Pertinent labs & imaging results that were available during my care of the patient were reviewed by me and considered in my medical decision making (see chart for details).  Patient is a 78 year old female with  history of paroxysmal atrial fibrillation currently taking Cardizem and Eliquis presenting with palpitations and elevated heart beat.  Her EKG confirms that she is in atrial fibrillation with rapid ventricular response.  Laboratory studies show electrolytes to be within normal limits and troponin is negative.  Care  discussed with Dr. Domenic Polite from cardiology.  We are in agreement that this patient is a good candidate for cardioversion and discharge.  Patient was given conscious sedation using propofol, then successfully cardioverted to a sinus rhythm using 100 J of electricity.  Patient tolerated the procedure well.  She is now awake, alert, ambulatory and appears appropriate for discharge.  She has an appointment with Dr. Jacinta Shoe the 15th of this month and I have advised her to keep this appointment.  CRITICAL CARE Performed by: Veryl Speak Total critical care time: 45 minutes Critical care time was exclusive of separately billable procedures and treating other patients. Critical care was necessary to treat or prevent imminent or life-threatening deterioration. Critical care was time spent personally by me on the following activities: development of treatment plan with patient and/or surrogate as well as nursing, discussions with consultants, evaluation of patient's response to treatment, examination of patient, obtaining history from patient or surrogate, ordering and performing treatments and interventions, ordering and review of laboratory studies, ordering and review of radiographic studies, pulse oximetry and re-evaluation of patient's condition.    Final Clinical Impressions(s) / ED Diagnoses   Final diagnoses:  None    ED Discharge Orders    None       Veryl Speak, MD 08/09/18 1254

## 2018-08-12 ENCOUNTER — Telehealth: Payer: Self-pay | Admitting: Cardiovascular Disease

## 2018-08-12 NOTE — Telephone Encounter (Signed)
Patient had sedation with cardioversion yesterday and feels fatigued. I encouraged her to rest and drink non caffeinated drinks today. She will call me tomorrow and let me know how she feels

## 2018-08-12 NOTE — Telephone Encounter (Signed)
Patient called stating that she was seen in the [facility] on 08/09/2018 . States that she is not feeling well, Very fatigue.

## 2018-08-14 ENCOUNTER — Telehealth: Payer: Self-pay | Admitting: Cardiovascular Disease

## 2018-08-14 NOTE — Telephone Encounter (Signed)
Virtual Visit Pre-Appointment Phone Call  "(Name), I am calling you today to discuss your upcoming appointment. We are currently trying to limit exposure to the virus that causes COVID-19 by seeing patients at home rather than in the office."  1. "What is the BEST phone number to call the day of the visit?" - include this in appointment notes  2. Do you have or have access to (through a family member/friend) a smartphone with video capability that we can use for your visit?" a. If yes - list this number in appt notes as cell (if different from BEST phone #) and list the appointment type as a VIDEO visit in appointment notes b. If no - list the appointment type as a PHONE visit in appointment notes  3. Confirm consent - "In the setting of the current Covid19 crisis, you are scheduled for a (phone or video) visit with your provider on (date) at (time).  Just as we do with many in-office visits, in order for you to participate in this visit, we must obtain consent.  If you'd like, I can send this to your mychart (if signed up) or email for you to review.  Otherwise, I can obtain your verbal consent now.  All virtual visits are billed to your insurance company just like a normal visit would be.  By agreeing to a virtual visit, we'd like you to understand that the technology does not allow for your provider to perform an examination, and thus may limit your provider's ability to fully assess your condition. If your provider identifies any concerns that need to be evaluated in person, we will make arrangements to do so.  Finally, though the technology is pretty good, we cannot assure that it will always work on either your or our end, and in the setting of a video visit, we may have to convert it to a phone-only visit.  In either situation, we cannot ensure that we have a secure connection.  Are you willing to proceed?" STAFF: Did the patient verbally acknowledge consent to telehealth visit? Document  YES/NO here: Yes  4. Advise patient to be prepared - "Two hours prior to your appointment, go ahead and check your blood pressure, pulse, oxygen saturation, and your weight (if you have the equipment to check those) and write them all down. When your visit starts, your provider will ask you for this information. If you have an Apple Watch or Kardia device, please plan to have heart rate information ready on the day of your appointment. Please have a pen and paper handy nearby the day of the visit as well."  5. Give patient instructions for MyChart download to smartphone OR [email or web address] as below if video visit (depending on what platform provider is using)  6. Inform patient they will receive a phone call 15 minutes prior to their appointment time (may be from unknown caller ID) so they should be prepared to answer    TELEPHONE CALL NOTE  [place] has been deemed a candidate for a follow-up tele-health visit to limit community exposure during the Covid-19 pandemic. I spoke with the patient via phone to ensure availability of phone/video source, confirm preferred email & phone number, and discuss instructions and expectations.  I reminded Liah O Izard to be prepared with any vital sign and/or heart rhythm information that could potentially be obtained via home monitoring, at the time of her visit. I reminded FRANCINE HANNAN to expect a phone call prior to  her visit.  Bertram Gala Goins 08/14/2018 4:01 PM

## 2018-08-19 ENCOUNTER — Telehealth: Payer: Self-pay | Admitting: Cardiovascular Disease

## 2018-08-19 NOTE — Telephone Encounter (Signed)
Spoke with Marzetta Board at [facility] clinic pt will have telehealth appt via doximity video tomorrow @ 10 am - pt aware to have BP/HR available and verbalized consent for telehealth appt. Cell phone number added to appt notes.

## 2018-08-19 NOTE — Telephone Encounter (Signed)
She was recently cardioverted in the ED for atrial fibrillation, and I wonder if she has gone back into it. Please see if she can have a telehealth visit with the a fib clinic.

## 2018-08-19 NOTE — Telephone Encounter (Signed)
Pt c/o chest discomfort (like heartburn/intagestion) dizziness/SOB when up and moving - when she is sitting or lying down she doesn't have any symptoms. Says HR is 53 on blood pressure monitor then when she moves goes up to 115-120 BP by home monitor just now 152/81 (has taken morning dose of medications). Has telehealth appt with Dr [name] 5/14 - will forward to provider

## 2018-08-19 NOTE — Telephone Encounter (Signed)
Please give pt a call - pt was seen in ER on 08/09/2018, was seen for Afib.   This morning she's having shortness of breath when she moves and gets dizzy, lightheaded, weak and gets really tired quickly.   If she's laying down she feels ok, states no chest pain, but it feels like someone is "stepping" on her chest.

## 2018-08-20 ENCOUNTER — Ambulatory Visit (HOSPITAL_COMMUNITY)
Admission: RE | Admit: 2018-08-20 | Discharge: 2018-08-20 | Disposition: A | Discharge status: Home / Self Care | Payer: Medicare Other | Source: Ambulatory Visit | Attending: Nurse Practitioner | Admitting: Nurse Practitioner

## 2018-08-20 ENCOUNTER — Encounter (HOSPITAL_COMMUNITY): Payer: Self-pay | Admitting: Nurse Practitioner

## 2018-08-20 ENCOUNTER — Other Ambulatory Visit: Payer: Self-pay

## 2018-08-20 VITALS — BP 163/110 | HR 78 | Ht 61.0 in | Wt 124.0 lb

## 2018-08-20 DIAGNOSIS — I4891 Unspecified atrial fibrillation: Secondary | ICD-10-CM

## 2018-08-20 MED ORDER — METOPROLOL TARTRATE 25 MG PO TABS: 25.0000 mg | ORAL_TABLET | Freq: Two times a day (BID) | ORAL | 3 refills | Status: DC

## 2018-08-20 NOTE — Progress Notes (Signed)
Electrophysiology TeleHealth Note   Due to national recommendations of social distancing due to [place] 19, Audio/video telehealth visit is felt to be most appropriate for this patient at this time.  See MyChart message/consent below from today for patient consent regarding telehealth for the [facility].    Date:  08/20/2018   ID:  Hannah Roy, DOB 11/13/1940, MRN [id number]  Location: home Provider location: [address] [place], [place] [address] Evaluation Performed: New patient consult  PCP:  Kathyrn Drown, MD  Primary Cardiologist:  Dr. [name]  Primary Electrophysiologist:  none  CC: afib     History of Present Illness: Hannah Roy is a 78 y.o. female wth h/o HTN, hypothyroidism, who presents via audio/video conferencing for a telehealth visit today.   The patient is referred for new consultation regarding afib by Dr. [name].  She has a h/o of paroxysmal afib first found in a ER visit in 1/31 of this year. She was admitted overnight, returned to SR  with  DILT drip and started on  eliquis, CHA2DS2VASc score of 4, and diltiazem at that time.  Her echo showed EF of 60-65%, severley dilated left and rt atrial size with myxomatous mitral valve, moderate basal septal hypertrophy, moderate tricuspid regurgitation and moderate to severe aortic regurgitation.  She had a f/u with Dr.Koneswaran 2/7 and was stable in SR.   She returned to the ER 5/1 with afib with RVR and had successful cardioversion. Apparently, this did not last long, as she feels that she has not felt any better with exertional fatigue and dyspnea, dizziness. Her Bp this am is elevated at 163/110 with a HR range form 112 to 160 bpm with she moves around. She states that she is weighting daily and has not seen increase in weight or LLE . No PND/orthopnea. She does not appear to be short of breath with video call and her daughter is there as well. She is being compliant with eliquis.   Today, she  denies symptoms of orthopnea, PND, lower extremity edema, claudication,  presyncope, syncope, bleeding, or neurologic sequela. The patient is tolerating medications without difficulties and is otherwise without complaint today.   she denies symptoms of cough, fevers, chills, or new SOB worrisome for COVID 19.    Atrial Fibrillation Risk Factors:  she does not have symptoms or diagnosis of sleep apnea. she does not have a history of rheumatic fever. she does not have a history of alcohol use. The patient does not have a history of early familial atrial fibrillation or other arrhythmias.  she has a BMI of Body mass index is 23.43 kg/m.[name] Filed Weights   08/20/18 0940  Weight: 56.2 kg    Past Medical History:  Diagnosis Date  . Aortic atherosclerosis ([place]) 05/07/2018  . Atrial fibrillation ([place]) 05/15/2018  . Colon polyps   . History of seasonal allergies   . Hypertension   . Osteopenia 2006   Past Surgical History:  Procedure Laterality Date  . APPENDECTOMY    . COLONOSCOPY  03/23/2011   repeat in 5 years,Procedure: COLONOSCOPY;  Surgeon: Rogene Houston, MD;  [facility];  Service: Endoscopy;  Laterality: N/A;  1:00  . COLONOSCOPY N/A 11/09/2016   Procedure: COLONOSCOPY;  Surgeon: Rogene Houston, MD;  [facility];  Service: Endoscopy;  Laterality: N/A;  1030  . TUBAL LIGATION       Current Outpatient Medications  Medication Sig Dispense Refill  . apixaban (  ELIQUIS) 5 MG TABS tablet Take 1 tablet (5 mg total) by mouth 2 (two) times daily for 30 days. 60 tablet 3  . diltiazem (CARTIA XT) 240 MG 24 hr capsule Take 1 capsule (240 mg total) by mouth daily. 30 capsule 11  . losartan (COZAAR) 25 MG tablet Take 1 tablet (25 mg total) by mouth daily. 30 tablet 5  . metoprolol tartrate (LOPRESSOR) 25 MG tablet Take 1 tablet (25 mg total) by mouth 2 (two) times daily. 60 tablet 3  . valACYclovir (VALTREX) 1000 MG tablet Take 1,000 mg by mouth 2 (two) times daily.  Take two tablets by mouth once daily for fever blisters     No current facility-administered medications for this encounter.     Allergies:   Bee venom; Boniva [ibandronic acid]; Lisinopril; and Latex   Social History:  The patient  reports that she has quit smoking. She has a 5.00 pack-year smoking history. She has never used smokeless tobacco. She reports that she does not drink alcohol or use drugs.   Family History:  The patient's  family history includes Colon cancer in her daughter.    ROS:  Please see the history of present illness.   All other systems are personally reviewed and negative.   Exam: Well appearing, alert and conversant, regular work of breathing,  good skin color  Recent Labs: 05/10/2018: ALT 33 05/11/2018: Magnesium 2.3 05/15/2018: TSH 3.440 08/09/2018: BUN 16; Creatinine, Ser 0.77; Hemoglobin 14.5; Platelets 268; Potassium 3.5; Sodium 141  personally reviewed    Other studies personally reviewed: Epic records reviewed  Echo-1. The left ventricle has normal systolic function of 65-68%. The cavity size is normal. There is moderate focal basal septal left ventricular wall thickness. Echo evidence of unable to assess due to arrhythmia (atrial fibrillation and/or flutter)  diastolic filling patterns.  2. Severely dilated left atrial size.  3. Moderately dilated right atrial size.  4. The mitral valve is myxomatous. There is mild thickening. Regurgitation is mild to moderate by color flow Doppler.  5. Normal tricuspid valve.  6. Tricuspid regurgitation moderate.  7. The aortic valve tricuspid. There is moderate thickening of the aortic valve. Aortic valve regurgitation is moderate to severe by color flow Doppler. The calculated aortic valve area is 0.99 cm consistent with mild stenosis.  8. Mild dilatation of the aortic root.  9. The inferior vena cava was dilated in size with >50% respiratory variablity. 10. The patient was tachycardic throughout the study with  evidence of atrial fibrillation and/or flutter. 11. No atrial level shunt detected by color flow Doppler.  FINDINGS  Left Ventricle: No evidence of left ventricular regional wall motion abnormalities. The left ventricle has normal systolic function of 12-75%. The cavity size is normal. There is moderate focal basal septal left ventricular wall thickness. Echo evidence  of unable to assess due to arrhythmia (atrial fibrillation and/or flutter) diastolic filling patterns. Right Ventricle: The right ventricle is normal in size. There is normal hypertrophy. There is normal systolic function. Left Atrium: The left atrium is severely dilated. Right Atrium: The right atrial size is moderately dilated. Interatrial Septum: No atrial level shunt detected by color flow Doppler.  Pericardium: There is no evidence of pericardial effusion. Mitral Valve: The mitral valve is myxomatous. There is mild thickening. Regurgitation is mild to moderate by color flow Doppler. Tricuspid Valve: The tricuspid valve is normal in structure. Tricuspid regurgitation moderate by color flow Doppler. Aortic Valve: The aortic valve tricuspid. There is moderate thickening of  the aortic valve. Aortic valve regurgitation is moderate to severe by color flow Doppler. The calculated aortic valve area is 0.99 cm consistent with mild stenosis. Pulmonic Valve: The pulmonic valve is grossly normal. Pulmonic valve regurgitation is trivial by color flow Doppler. Aorta: There is mild dilatation of the aortic root. Venous: The inferior vena cava was dilated in size with greater than 50% respiratory variablity. In comparison to the previous echocardiogram(s): There are no prior studies on this patient for comparison purposes. The patient was tachycardic throughout the study with evidence of atrial fibrillation and/or flutter.   CT -IMPRESSION: 1. 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  The aortic valve is partially calcified. Recommend annual imaging followup by CTA or MRA. This recommendation follows 2010 ACCF/AHA/AATS/ACR/ASA/SCA/SCAI/SIR/STS/SVM Guidelines for the Diagnosis and Management of Patients with Thoracic Aortic Disease. Circulation. 2010; 121: M767-M094. Aortic aneurysm NOS (ICD10-I71.9) 2. Moderate cardiac enlargement with particular enlargement of the left atrium. 3. Dilated central pulmonary arteries which may implicate a component of pulmonary hypertension.  Aortic aneurysm NOS (ICD10-I71.9).    ASSESSMENT AND PLAN:  1. Persistent afib with RVR General education re afib and triggers Denies any excessive caffeine, snoring, alcohol use,tobacco use Will add metoprolol 25 mg bid to help slow v rates Continue diltaizem 240 mg daily If adjusting oral meds does not achieve good rate control and reduction of symptoms, then antiarrythmic's may be needed and/or further evaluation of her valvular abnormalities as she has significant structural/valvular  abnormalities that may be driving her afib. With her severely dilated left atrium it may be difficult to restore and continue SR long term   This patients CHA2DS2-VASc Score and unadjusted Ischemic Stroke Rate (% per year) is equal to 4.8 % stroke rate/year from a score of 4  Above score calculated as 1 point each if present [CHF, HTN, DM, Vascular=MI/PAD/Aortic Plaque, Age if 65-74, or Female] Above score calculated as 2 points each if present [Age > 75, or Stroke/TIA/TE]  2.HTN Poorly controlled today BB being added Avoid salt  COVID screen The patient does not have any symptoms that suggest any further testing/ screening at this time.  Social distancing reinforced today.    Follow-up: She has a virtual appointment already in place on Thursday with Dr. [name] and he can further evaluate her afib control/BP management  at that time  I reviewed with her and her daughter changes in her condition  what would indicate to bestreport to ER.(increased shortness of breath, PND, orthopnea, presyncope, sustained rapid HR over 130 BPM.  Current medicines are reviewed at length with the patient today.   The patient has concerns regarding her medicines.  The following changes were made today: Addition of BB for better rate control  Labs/ tests ordered today include: none No orders of the defined types were placed in this encounter.   Patient Risk:  after full review of this patients clinical status, I feel that they are at  high risk at this time.   Today, I have spent  15 minutes with the patient with telehealth technology discussing afib management  Signed, Roderic Palau NP 08/20/2018 10:40 AM  Afib [facility] [address] [place], [place] [address] [phone]   I hereby voluntarily request, consent and authorize the [facility] and its employed or contracted physicians, physician assistants, nurse practitioners or other licensed health care professionals (the Practitioner), to provide me with telemedicine health care services (the "Services") as deemed  necessary by the treating Practitioner. I acknowledge and consent to receive the Services by the Practitioner via telemedicine. I understand that the telemedicine visit will involve communicating with the Practitioner through live audiovisual communication technology and the disclosure of certain medical information by electronic transmission. I acknowledge that I have been given the opportunity to request an in-person assessment or other available alternative prior to the telemedicine visit and am voluntarily participating in the telemedicine visit.   I understand that I have the right to withhold or withdraw my consent to the use of telemedicine in the course of my care at any time, without affecting my right to future care or treatment, and that the Practitioner or I may terminate the telemedicine visit  at any time. I understand that I have the right to inspect all information obtained and/or recorded in the course of the telemedicine visit and may receive copies of available information for a reasonable fee.  I understand that some of the potential risks of receiving the Services via telemedicine include:   Delay or interruption in medical evaluation due to technological equipment failure or disruption;  Information transmitted may not be sufficient (e.g. poor resolution of images) to allow for appropriate medical decision making by the Practitioner; and/or  In rare instances, security protocols could fail, causing a breach of personal health information.   Furthermore, I acknowledge that it is my responsibility to provide information about my medical history, conditions and care that is complete and accurate to the best of my ability. I acknowledge that Practitioner's advice, recommendations, and/or decision may be based on factors not within their control, such as incomplete or inaccurate data provided by me or distortions of diagnostic images or specimens that may result from electronic transmissions. I understand that the practice of medicine is not an exact science and that Practitioner makes no warranties or guarantees regarding treatment outcomes. I acknowledge that I will receive a copy of this consent concurrently upon execution via email to the email address I last provided but may also request a printed copy by calling the office of the [facility].  I understand that my insurance will be billed for this visit.   I have read or had this consent read to me.  I understand the contents of this consent, which adequately explains the benefits and risks of the Services being provided via telemedicine.  I have been provided ample opportunity to ask questions regarding this consent and the Services and have had my questions answered to my satisfaction.  I give my informed consent for  the services to be provided through the use of telemedicine in my medical care  By participating in this telemedicine visit I agree to the above.

## 2018-08-22 ENCOUNTER — Other Ambulatory Visit: Payer: Self-pay | Admitting: Cardiovascular Disease

## 2018-08-22 ENCOUNTER — Encounter: Payer: Self-pay | Admitting: Cardiovascular Disease

## 2018-08-22 ENCOUNTER — Telehealth: Payer: Self-pay

## 2018-08-22 ENCOUNTER — Telehealth (INDEPENDENT_AMBULATORY_CARE_PROVIDER_SITE_OTHER): Payer: Medicare Other | Admitting: Cardiovascular Disease

## 2018-08-22 VITALS — BP 121/78 | HR 70 | Temp 97.0°F | Ht 61.0 in | Wt 123.0 lb

## 2018-08-22 DIAGNOSIS — I061 Rheumatic aortic insufficiency: Secondary | ICD-10-CM

## 2018-08-22 DIAGNOSIS — I10 Essential (primary) hypertension: Secondary | ICD-10-CM

## 2018-08-22 DIAGNOSIS — I099 Rheumatic heart disease, unspecified: Secondary | ICD-10-CM

## 2018-08-22 DIAGNOSIS — I77810 Thoracic aortic ectasia: Secondary | ICD-10-CM

## 2018-08-22 DIAGNOSIS — I340 Nonrheumatic mitral (valve) insufficiency: Secondary | ICD-10-CM

## 2018-08-22 DIAGNOSIS — I38 Endocarditis, valve unspecified: Secondary | ICD-10-CM

## 2018-08-22 DIAGNOSIS — I350 Nonrheumatic aortic (valve) stenosis: Secondary | ICD-10-CM

## 2018-08-22 DIAGNOSIS — I050 Rheumatic mitral stenosis: Secondary | ICD-10-CM

## 2018-08-22 DIAGNOSIS — I4819 Other persistent atrial fibrillation: Secondary | ICD-10-CM

## 2018-08-22 MED ORDER — SODIUM CHLORIDE 0.9% FLUSH: 3.0000 mL | Freq: Two times a day (BID) | INTRAVENOUS | Status: DC

## 2018-08-22 NOTE — Progress Notes (Signed)
Medication Instructions:  Your physician recommends that you continue on your current medications as directed. Please refer to the Current Medication list given to you today.   Labwork:    Testing/Procedures:   Follow-Up: Your physician recommends that you schedule a follow-up appointment in: With Dr. Roxy Manns after procedures   Your physician recommends that you schedule a follow-up appointment in: With Dr. [name] to be determined   Any Other Special Instructions Will Be Listed Below (If Applicable).     If you need a refill on your cardiac medications before your next appointment, please call your pharmacy.

## 2018-08-22 NOTE — Progress Notes (Signed)
Virtual Visit via Video Note   This visit type was conducted due to national recommendations for restrictions regarding the COVID-19 Pandemic (e.g. social distancing) in an effort to limit this patient's exposure and mitigate transmission in our community.  Due to her co-morbid illnesses, this patient is at least at moderate risk for complications without adequate follow up.  This format is felt to be most appropriate for this patient at this time.  All issues noted in this document were discussed and addressed.  A limited physical exam was performed with this format.  Please refer to the patient's chart for her consent to telehealth for [facility].   Date:  08/22/2018   ID:  Hannah Roy, DOB Jan 12, 1941, MRN [id number]  Patient Location: Home Provider Location: Home  PCP:  Kathyrn Drown, MD  Cardiologist:  Kate Sable, MD  Electrophysiologist:  None   Evaluation Performed:  Follow-Up Visit  Chief Complaint:  Valvular heart disease, persistent atrial fibrillation  History of Present Illness:    Hannah Roy is a 78 y.o. 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  She was evaluated in the ED on 08/09/18 and was found to be in rapid atrial fibrillation and was successfully cardioverted with one 100J shock.  She then called our office on 5/11 complaining of chest discomfort/indigestion with dizziness and exertional dyspnea with fluctuating HR's.  I had her evaluated via telehealth by the atrial fibrillation clinic on 08/20/18.  Metoprolol 25 mg bid was added to her regimen. It was felt that valvular heart disease may be driving her atrial fibrillation and the structural heart team was contacted. The echocardiogram images were reviewed by Dr. Burt Knack and Dr. Roxy Manns.  Dr. Roxy Manns suggested TEE with right and left heart catheterization and then subsequent follow up with him for AVR/MVR/Maze consideration.  She is doing slightly better day but still has  exertional dyspnea with light housework. [name] is participating in the conversation in the background as well.  The patient does not have symptoms concerning for COVID-19 infection (fever, chills, cough, or new shortness of breath).   Social history: She is originally from [place].  She moved to the [place] at the age of 24.  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  She has an adult daughter who lives in [place] who has 3 sons.   Past Medical History:  Diagnosis Date  . Aortic atherosclerosis ([place]) 05/07/2018  . Atrial fibrillation ([place]) 05/15/2018  . Colon polyps   . History of seasonal allergies   . Hypertension   . Osteopenia 2006   Past Surgical History:  Procedure Laterality Date  . APPENDECTOMY    . COLONOSCOPY  03/23/2011   repeat in 5 years,Procedure: COLONOSCOPY;  Surgeon: Rogene Houston, MD;  [facility];  Service: Endoscopy;  Laterality: N/A;  1:00  . COLONOSCOPY N/A 11/09/2016   Procedure: COLONOSCOPY;  Surgeon: Rogene Houston, MD;  [facility];  Service: Endoscopy;  Laterality: N/A;  1030  . TUBAL LIGATION       Current Meds  Medication Sig  . apixaban (ELIQUIS) 5 MG TABS tablet Take 1 tablet (5 mg total) by mouth 2 (two) times daily for 30 days.  [name] diltiazem (CARTIA XT) 240 MG 24 hr capsule Take 1 capsule (240 mg total) by mouth daily.  [name] losartan (COZAAR) 25 MG tablet Take 1 tablet (25 mg total)  by mouth daily.  . metoprolol tartrate (LOPRESSOR) 25 MG tablet Take 1 tablet (25 mg total) by mouth 2 (two) times daily.  . valACYclovir (VALTREX) 1000 MG tablet Take 1,000 mg by mouth 2 (two) times daily. Take two tablets by mouth once daily for fever blisters     Allergies:   Bee venom; Boniva [ibandronic acid]; Lisinopril; and Latex   Social History   Tobacco Use  . Smoking status: Former Smoker    Packs/day: 0.50    Years: 10.00    Pack years: 5.00  . Smokeless tobacco: Never  Used  Substance Use Topics  . Alcohol use: No  . Drug use: No     Family Hx: The patient's family history includes Colon cancer in her daughter.  ROS:   Please see the history of present illness.     All other systems reviewed and are negative.   Prior CV studies:   The following studies were reviewed today:  Echocardiogram 05/10/18:  Normal left ventricular systolic function, LVEF 60 to 65%.  There was moderate focal basal septal hypertrophy.  There was severe left atrial and moderate right atrial dilatation.  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  There was moderate to severe aortic regurgitation and mild aortic stenosis.  The aortic root was mildly dilated.    Labs/Other Tests and Data Reviewed:    EKG:  An ECG dated 08/09/18 was personally reviewed today and demonstrated:  rapid atrial fibrillation, 122 bpm  Recent Labs: 05/10/2018: ALT 33 05/11/2018: Magnesium 2.3 05/15/2018: TSH 3.440 08/09/2018: BUN 16; Creatinine, Ser 0.77; Hemoglobin 14.5; Platelets 268; Potassium 3.5; Sodium 141   Recent Lipid Panel Lab Results  Component Value Date/Time   CHOL 195 08/03/2017 09:54 AM   TRIG 107 08/03/2017 09:54 AM   HDL 64 08/03/2017 09:54 AM   CHOLHDL 3.0 08/03/2017 09:54 AM   CHOLHDL 3.4 05/28/2014 10:07 AM   LDLCALC 110 (H) 08/03/2017 09:54 AM    Wt Readings from Last 3 Encounters:  08/22/18 123 lb (55.8 kg)  08/20/18 124 lb (56.2 kg)  08/09/18 125 lb (56.7 kg)     Objective:    Vital Signs:  BP 121/78   Pulse 70   Temp (!) 97 F (36.1 C)   Ht 5\' 1"  (1.549 m)   Wt 123 lb (55.8 kg)   BMI 23.24 kg/m    VITAL SIGNS:  reviewed GEN:  no acute distress EYES:  sclerae anicteric, EOMI - Extraocular Movements Intact MUSCULOSKELETAL:  no obvious deformities. NEURO:  alert and oriented x 3, no obvious focal deficit PSYCH:  normal affect  ASSESSMENT & PLAN:    1.  Persisent atrial fibrillation: Symptomatically  improved with addition of Lopressor 25 mg bid.  Likely driven by valvular heart disease. Currently on long-acting diltiazem 240 mg daily as well.  Systemically anticoagulated with Eliquis 5 mg twice daily. Will likely require MAZE procedure with concomitant MVR + AVR.  2.  Rheumatic valvular heart disease: Echocardiogram results detailed above with mitral stenosis and regurgitation and aortic regurgitation. Dr. Roxy Manns suggested TEE with right and left heart catheterization and then subsequent follow up with him for AVR+MVR+Maze consideration. I will arrange.  3.  Hypertension: Blood pressure is normal. No changes.  4.  Aortic root dilatation: She also has moderate to severe aortic regurgitation. 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   COVID-19 Education: The signs and  symptoms of COVID-19 were discussed with the patient and how to seek care for testing (follow up with PCP or arrange E-visit).  The importance of social distancing was discussed today.  Time:   Today, I have spent 40 minutes with the patient with telehealth technology discussing the above problems.     Medication Adjustments/Labs and Tests Ordered: Current medicines are reviewed at length with the patient today.  Concerns regarding medicines are outlined above.   Tests Ordered: No orders of the defined types were placed in this encounter.   Medication Changes: No orders of the defined types were placed in this encounter.   Disposition:  Follow up with Dr. Roxy Manns after aforementioned procedures. Follow up with me thereafter.  Signed, Kate Sable, MD  08/22/2018 1:35 PM    [facility]

## 2018-08-22 NOTE — Telephone Encounter (Signed)
TEE with Dr.[place] 08/29/18 at 8:15 am, patient to arrive to admitting at 6:45 am  HOLD Eliquis starting Tuesday 08/27/18  R/L heart cath at 10:30 am with Dr.Harding  CBC,BMET done on 08/09/18, so she only needs Covid testing  Covid test scheduled for Monday, 08/26/18 at 11:15 am at the [facility] [place], [address], [place]    I will call patient and have her pick up instructions for TEE/Cath tomorrow 08/23/18

## 2018-08-22 NOTE — H&P (View-Only) (Signed)
Virtual Visit via Video Note   This visit type was conducted due to national recommendations for restrictions regarding the COVID-19 Pandemic (e.g. social distancing) in an effort to limit this patient's exposure and mitigate transmission in our community.  Due to her co-morbid illnesses, this patient is at least at moderate risk for complications without adequate follow up.  This format is felt to be most appropriate for this patient at this time.  All issues noted in this document were discussed and addressed.  A limited physical exam was performed with this format.  Please refer to the patient's chart for her consent to telehealth for [facility].   Date:  08/22/2018   ID:  [name], DOB 03/02/1941, MRN [id number]  Patient Location: Home Provider Location: Home  PCP:  Kathyrn Drown, MD  Cardiologist:  Kate Sable, MD  Electrophysiologist:  None   Evaluation Performed:  Follow-Up Visit  Chief Complaint:  Valvular heart disease, persistent atrial fibrillation  History of Present Illness:    Hannah Roy is a 78 y.o. 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  She was evaluated in the ED on 08/09/18 and was found to be in rapid atrial fibrillation and was successfully cardioverted with one 100J shock.  She then called our office on 5/11 complaining of chest discomfort/indigestion with dizziness and exertional dyspnea with fluctuating HR's.  I had her evaluated via telehealth by the atrial fibrillation clinic on 08/20/18.  Metoprolol 25 mg bid was added to her regimen. It was felt that valvular heart disease may be driving her atrial fibrillation and the structural heart team was contacted. The echocardiogram images were reviewed by Dr. Burt Knack and Dr. Roxy Manns.  Dr. Roxy Manns suggested TEE with right and left heart catheterization and then subsequent follow up with him for AVR/MVR/Maze consideration.  She is doing slightly better day but still has  exertional dyspnea with light housework. [name] is participating in the conversation in the background as well.  The patient does not have symptoms concerning for COVID-19 infection (fever, chills, cough, or new shortness of breath).   Social history: She is originally from [place].  She moved [place] the [place] the age of 16.  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  She has an adult daughter who lives in [place] who has 3 sons.   Past Medical History:  Diagnosis Date  . Aortic atherosclerosis ([place]) 05/07/2018  . Atrial fibrillation ([place]) 05/15/2018  . Colon polyps   . History of seasonal allergies   . Hypertension   . Osteopenia 2006   Past Surgical History:  Procedure Laterality Date  . APPENDECTOMY    . COLONOSCOPY  03/23/2011   repeat in 5 years,Procedure: COLONOSCOPY;  Surgeon: Rogene Houston, MD;  Location: [facility];  Service: Endoscopy;  Laterality: N/A;  1:00  . COLONOSCOPY N/A 11/09/2016   Procedure: COLONOSCOPY;  Surgeon: Rogene Houston, MD;  Location: [facility];  Service: Endoscopy;  Laterality: N/A;  1030  . TUBAL LIGATION       Current Meds  Medication Sig  . apixaban (ELIQUIS) 5 MG TABS tablet Take 1 tablet (5 mg total) by mouth 2 (two) times daily for 30 days.  [name] diltiazem (CARTIA XT) 240 MG 24 hr capsule Take 1 capsule (240 mg total) by mouth daily.  [name] losartan (COZAAR) 25 MG tablet Take 1 tablet (25 mg total)  by mouth daily.  . metoprolol tartrate (LOPRESSOR) 25 MG tablet Take 1 tablet (25 mg total) by mouth 2 (two) times daily.  . valACYclovir (VALTREX) 1000 MG tablet Take 1,000 mg by mouth 2 (two) times daily. Take two tablets by mouth once daily for fever blisters     Allergies:   Bee venom; Boniva [ibandronic acid]; Lisinopril; and Latex   Social History   Tobacco Use  . Smoking status: Former Smoker    Packs/day: 0.50    Years: 10.00    Pack years: 5.00  . Smokeless tobacco: Never  Used  Substance Use Topics  . Alcohol use: No  . Drug use: No     Family Hx: The patient's family history includes Colon cancer in her daughter.  ROS:   Please see the history of present illness.     All other systems reviewed and are negative.   Prior CV studies:   The following studies were reviewed today:  Echocardiogram 05/10/18:  Normal left ventricular systolic function, LVEF 60 to 65%.  There was moderate focal basal septal hypertrophy.  There was severe left atrial and moderate right atrial dilatation.  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  There was moderate to severe aortic regurgitation and mild aortic stenosis.  The aortic root was mildly dilated.    Labs/Other Tests and Data Reviewed:    EKG:  An ECG dated 08/09/18 was personally reviewed today and demonstrated:  rapid atrial fibrillation, 122 bpm  Recent Labs: 05/10/2018: ALT 33 05/11/2018: Magnesium 2.3 05/15/2018: TSH 3.440 08/09/2018: BUN 16; Creatinine, Ser 0.77; Hemoglobin 14.5; Platelets 268; Potassium 3.5; Sodium 141   Recent Lipid Panel Lab Results  Component Value Date/Time   CHOL 195 08/03/2017 09:54 AM   TRIG 107 08/03/2017 09:54 AM   HDL 64 08/03/2017 09:54 AM   CHOLHDL 3.0 08/03/2017 09:54 AM   CHOLHDL 3.4 05/28/2014 10:07 AM   LDLCALC 110 (H) 08/03/2017 09:54 AM    Wt Readings from Last 3 Encounters:  08/22/18 123 lb (55.8 kg)  08/20/18 124 lb (56.2 kg)  08/09/18 125 lb (56.7 kg)     Objective:    Vital Signs:  BP 121/78   Pulse 70   Temp (!) 97 F (36.1 C)   Ht 5\' 1"  (1.549 m)   Wt 123 lb (55.8 kg)   BMI 23.24 kg/m    VITAL SIGNS:  reviewed GEN:  no acute distress EYES:  sclerae anicteric, EOMI - Extraocular Movements Intact MUSCULOSKELETAL:  no obvious deformities. NEURO:  alert and oriented x 3, no obvious focal deficit PSYCH:  normal affect  ASSESSMENT & PLAN:    1.  Persisent atrial fibrillation: Symptomatically  improved with addition of Lopressor 25 mg bid.  Likely driven by valvular heart disease. Currently on long-acting diltiazem 240 mg daily as well.  Systemically anticoagulated with Eliquis 5 mg twice daily. Will likely require MAZE procedure with concomitant MVR + AVR.  2.  Rheumatic valvular heart disease: Echocardiogram results detailed above with mitral stenosis and regurgitation and aortic regurgitation. Dr. Roxy Manns suggested TEE with right and left heart catheterization and then subsequent follow up with him for AVR+MVR+Maze consideration. I will arrange.  3.  Hypertension: Blood pressure is normal. No changes.  4.  Aortic root dilatation: She also has moderate to severe aortic regurgitation. 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   COVID-19 Education: The signs and  symptoms of COVID-19 were discussed with the patient and how to seek care for testing (follow up with PCP or arrange E-visit).  The importance of social distancing was discussed today.  Time:   Today, I have spent 40 minutes with the patient with telehealth technology discussing the above problems.     Medication Adjustments/Labs and Tests Ordered: Current medicines are reviewed at length with the patient today.  Concerns regarding medicines are outlined above.   Tests Ordered: No orders of the defined types were placed in this encounter.   Medication Changes: No orders of the defined types were placed in this encounter.   Disposition:  Follow up with Dr. Roxy Manns after aforementioned procedures. Follow up with me thereafter.  Signed, Kate Sable, MD  08/22/2018 1:35 PM    [facility]

## 2018-08-22 NOTE — Progress Notes (Signed)
Medication Instructions:  Your physician recommends that you continue on your current medications as directed. Please refer to the Current Medication list given to you today.   Labwork: CMET CBC COVID 19 TEST   Testing/Procedures:  Your physician has requested that you have a TEE. During a TEE, sound waves are used to create images of your heart. It provides your doctor with information about the size and shape of your heart and how well your heart's chambers and valves are working. In this test, a transducer is attached to the end of a flexible tube that's guided down your throat and into your esophagus (the tube leading from you mouth to your stomach) to get a more detailed image of your heart. You are not awake for the procedure. Please see the instruction sheet given to you today. For further information please visit [email or web address].   Your physician has requested that you have a cardiac catheterization. Cardiac catheterization is used to diagnose and/or treat various heart conditions. Doctors may recommend this procedure for a number of different reasons. The most common reason is to evaluate chest pain. Chest pain can be a symptom of coronary artery disease (CAD), and cardiac catheterization can show whether plaque is narrowing or blocking your heart's arteries. This procedure is also used to evaluate the valves, as well as measure the blood flow and oxygen levels in different parts of your heart. For further information please visit [email or web address]. Please follow instruction sheet, as given.    Follow-Up: Your physician recommends that you schedule a follow-up appointment in: WITH DR. OWEN AFTER PROCEDURES   Your physician recommends that you schedule a follow-up appointment in: TO BE DETERMINED WITH DR. Lynnell Jude ( AFTER VISIT WITH DR. Roxy Manns)      Any Other Special Instructions Will Be Listed Below (If Applicable).     If you need a refill on your cardiac medications  before your next appointment, please call your pharmacy.

## 2018-08-26 ENCOUNTER — Other Ambulatory Visit (HOSPITAL_COMMUNITY)
Admission: RE | Admit: 2018-08-26 | Discharge: 2018-08-26 | Disposition: A | Discharge status: Home / Self Care | Payer: Medicare Other | Source: Ambulatory Visit | Attending: Cardiovascular Disease | Admitting: Cardiovascular Disease

## 2018-08-26 DIAGNOSIS — Z1159 Encounter for screening for other viral diseases: Secondary | ICD-10-CM | POA: Insufficient documentation

## 2018-08-26 DIAGNOSIS — Z01812 Encounter for preprocedural laboratory examination: Secondary | ICD-10-CM | POA: Diagnosis not present

## 2018-08-27 LAB — NOVEL CORONAVIRUS, NAA (HOSP ORDER, SEND-OUT TO REF LAB; TAT 18-24 HRS): SARS-CoV-2, NAA: NOT DETECTED

## 2018-08-28 ENCOUNTER — Telehealth: Payer: Self-pay | Admitting: *Deleted

## 2018-08-28 NOTE — Telephone Encounter (Signed)
Pt contacted pre-catheterization scheduled at [facility] for: Thursday Aug 29, 2018 10:30 AM/TEE 8:15 AM Verified arrival time and place: [address] [facility] [address] A at: 6:45 AM  At this time, all patients are to wear a mask in the hospital. If you do not have one on arrival, you will be given one.    Covid-19 test date: 08/26/18 not detected  Nothing to eat or drink after midnight. Contrast allergy: no  Hold: Eliquis-none 08/27/18 until post procedure  Except hold medications AM meds can be  taken pre-cath with sip of water including: ASA 81 mg  Confirmed patient has responsible person to drive home post procedure and observe 24 hours after arriving home: yes  Visitors are not allowed in the hospital. You will be dropped off and picked up at [facility] Main Entrance A. Your designated party will be called after your procedure with an update and discharge information.   [name]    COVID-19 Pre-Screening Questions:  . In the past 7 to 10 days have you had a cough,  shortness of breath, headache, congestion, fever, body aches, chills, sore throat, or sudden loss of taste or sense of smell? no . Have you been around anyone with known Covid 19 ? no . Have you been around anyone who is awaiting Covid 19 test results in the past 7 to 10 days? no . Have you been around anyone who  has been exposed to Covid 19, or has mentioned symptoms of Covid 19 within the past 7 to 10 days? no   I reviewed procedure instructions/Covid 19 screening quesions with patient, she verbalized understanding.

## 2018-08-29 ENCOUNTER — Encounter (HOSPITAL_COMMUNITY): Payer: Self-pay | Admitting: *Deleted

## 2018-08-29 ENCOUNTER — Other Ambulatory Visit: Payer: Self-pay

## 2018-08-29 ENCOUNTER — Ambulatory Visit (HOSPITAL_BASED_OUTPATIENT_CLINIC_OR_DEPARTMENT_OTHER): Payer: Medicare Other

## 2018-08-29 ENCOUNTER — Encounter (HOSPITAL_COMMUNITY)
Admission: RE | Disposition: A | Discharge status: Home / Self Care | Payer: Self-pay | Source: Home / Self Care | Attending: Cardiovascular Disease

## 2018-08-29 ENCOUNTER — Ambulatory Visit (HOSPITAL_BASED_OUTPATIENT_CLINIC_OR_DEPARTMENT_OTHER)
Admission: RE | Admit: 2018-08-29 | Discharge: 2018-08-29 | Disposition: A | Discharge status: Home / Self Care | Payer: Medicare Other | Source: Home / Self Care | Attending: Cardiovascular Disease | Admitting: Cardiovascular Disease

## 2018-08-29 DIAGNOSIS — I2510 Atherosclerotic heart disease of native coronary artery without angina pectoris: Secondary | ICD-10-CM

## 2018-08-29 DIAGNOSIS — I951 Orthostatic hypotension: Secondary | ICD-10-CM | POA: Diagnosis not present

## 2018-08-29 DIAGNOSIS — Z79899 Other long term (current) drug therapy: Secondary | ICD-10-CM | POA: Insufficient documentation

## 2018-08-29 DIAGNOSIS — I342 Nonrheumatic mitral (valve) stenosis: Secondary | ICD-10-CM | POA: Diagnosis not present

## 2018-08-29 DIAGNOSIS — Z20828 Contact with and (suspected) exposure to other viral communicable diseases: Secondary | ICD-10-CM | POA: Diagnosis not present

## 2018-08-29 DIAGNOSIS — D62 Acute posthemorrhagic anemia: Secondary | ICD-10-CM | POA: Diagnosis not present

## 2018-08-29 DIAGNOSIS — I700 Atherosclerosis of aorta: Secondary | ICD-10-CM | POA: Insufficient documentation

## 2018-08-29 DIAGNOSIS — R079 Chest pain, unspecified: Secondary | ICD-10-CM | POA: Diagnosis not present

## 2018-08-29 DIAGNOSIS — I351 Nonrheumatic aortic (valve) insufficiency: Secondary | ICD-10-CM | POA: Diagnosis not present

## 2018-08-29 DIAGNOSIS — I099 Rheumatic heart disease, unspecified: Secondary | ICD-10-CM | POA: Diagnosis not present

## 2018-08-29 DIAGNOSIS — R270 Ataxia, unspecified: Secondary | ICD-10-CM | POA: Diagnosis not present

## 2018-08-29 DIAGNOSIS — R072 Precordial pain: Secondary | ICD-10-CM | POA: Diagnosis not present

## 2018-08-29 DIAGNOSIS — Z1159 Encounter for screening for other viral diseases: Secondary | ICD-10-CM | POA: Diagnosis not present

## 2018-08-29 DIAGNOSIS — Z7901 Long term (current) use of anticoagulants: Secondary | ICD-10-CM | POA: Insufficient documentation

## 2018-08-29 DIAGNOSIS — Z888 Allergy status to other drugs, medicaments and biological substances status: Secondary | ICD-10-CM | POA: Insufficient documentation

## 2018-08-29 DIAGNOSIS — R42 Dizziness and giddiness: Secondary | ICD-10-CM | POA: Diagnosis not present

## 2018-08-29 DIAGNOSIS — I5033 Acute on chronic diastolic (congestive) heart failure: Secondary | ICD-10-CM | POA: Diagnosis not present

## 2018-08-29 DIAGNOSIS — R0602 Shortness of breath: Secondary | ICD-10-CM | POA: Diagnosis not present

## 2018-08-29 DIAGNOSIS — I4819 Other persistent atrial fibrillation: Secondary | ICD-10-CM | POA: Insufficient documentation

## 2018-08-29 DIAGNOSIS — R000 Tachycardia, unspecified: Secondary | ICD-10-CM | POA: Diagnosis not present

## 2018-08-29 DIAGNOSIS — I10 Essential (primary) hypertension: Secondary | ICD-10-CM | POA: Insufficient documentation

## 2018-08-29 DIAGNOSIS — I080 Rheumatic disorders of both mitral and aortic valves: Secondary | ICD-10-CM | POA: Insufficient documentation

## 2018-08-29 DIAGNOSIS — Z87891 Personal history of nicotine dependence: Secondary | ICD-10-CM | POA: Insufficient documentation

## 2018-08-29 DIAGNOSIS — I4891 Unspecified atrial fibrillation: Secondary | ICD-10-CM | POA: Diagnosis not present

## 2018-08-29 DIAGNOSIS — I4892 Unspecified atrial flutter: Secondary | ICD-10-CM | POA: Diagnosis not present

## 2018-08-29 HISTORY — PX: TEE WITHOUT CARDIOVERSION: SHX5443

## 2018-08-29 HISTORY — PX: RIGHT/LEFT HEART CATH AND CORONARY ANGIOGRAPHY: CATH118266

## 2018-08-29 LAB — POCT I-STAT 7, (LYTES, BLD GAS, ICA,H+H)
Acid-base deficit: 4 mmol/L — ABNORMAL HIGH (ref 0.0–2.0)
Bicarbonate: 20.9 mmol/L (ref 20.0–28.0)
Calcium, Ion: 1.18 mmol/L (ref 1.15–1.40)
HCT: 30 % — ABNORMAL LOW (ref 36.0–46.0)
Hemoglobin: 10.2 g/dL — ABNORMAL LOW (ref 12.0–15.0)
O2 Saturation: 98 %
Potassium: 3.5 mmol/L (ref 3.5–5.1)
Sodium: 142 mmol/L (ref 135–145)
TCO2: 22 mmol/L (ref 22–32)
pCO2 arterial: 37.8 mmHg (ref 32.0–48.0)
pH, Arterial: 7.351 (ref 7.350–7.450)
pO2, Arterial: 109 mmHg — ABNORMAL HIGH (ref 83.0–108.0)

## 2018-08-29 LAB — POCT I-STAT EG7
Acid-base deficit: 3 mmol/L — ABNORMAL HIGH (ref 0.0–2.0)
Acid-base deficit: 4 mmol/L — ABNORMAL HIGH (ref 0.0–2.0)
Bicarbonate: 21.5 mmol/L (ref 20.0–28.0)
Bicarbonate: 22.1 mmol/L (ref 20.0–28.0)
Calcium, Ion: 1.17 mmol/L (ref 1.15–1.40)
Calcium, Ion: 1.17 mmol/L (ref 1.15–1.40)
HCT: 30 % — ABNORMAL LOW (ref 36.0–46.0)
HCT: 30 % — ABNORMAL LOW (ref 36.0–46.0)
Hemoglobin: 10.2 g/dL — ABNORMAL LOW (ref 12.0–15.0)
Hemoglobin: 10.2 g/dL — ABNORMAL LOW (ref 12.0–15.0)
O2 Saturation: 75 %
O2 Saturation: 79 %
Potassium: 3.5 mmol/L (ref 3.5–5.1)
Potassium: 3.5 mmol/L (ref 3.5–5.1)
Sodium: 142 mmol/L (ref 135–145)
Sodium: 142 mmol/L (ref 135–145)
TCO2: 23 mmol/L (ref 22–32)
TCO2: 23 mmol/L (ref 22–32)
pCO2, Ven: 39.8 mmHg — ABNORMAL LOW (ref 44.0–60.0)
pCO2, Ven: 40.4 mmHg — ABNORMAL LOW (ref 44.0–60.0)
pH, Ven: 7.34 (ref 7.250–7.430)
pH, Ven: 7.346 (ref 7.250–7.430)
pO2, Ven: 42 mmHg (ref 32.0–45.0)
pO2, Ven: 45 mmHg (ref 32.0–45.0)

## 2018-08-29 SURGERY — RIGHT/LEFT HEART CATH AND CORONARY ANGIOGRAPHY
Anesthesia: LOCAL

## 2018-08-29 SURGERY — ECHOCARDIOGRAM, TRANSESOPHAGEAL
Anesthesia: Moderate Sedation

## 2018-08-29 MED ORDER — FENTANYL CITRATE (PF) 100 MCG/2ML IJ SOLN
INTRAMUSCULAR | Status: DC | PRN
  Administered 2018-08-29 (×3): 25 ug via INTRAVENOUS
  Administered 2018-08-29: 12.5 ug via INTRAVENOUS

## 2018-08-29 MED ORDER — SODIUM CHLORIDE 0.9 % IV SOLN: 250.0000 mL | INTRAVENOUS | Status: DC | PRN

## 2018-08-29 MED ORDER — BUTAMBEN-TETRACAINE-BENZOCAINE 2-2-14 % EX AERO
INHALATION_SPRAY | CUTANEOUS | Status: DC | PRN
  Administered 2018-08-29: 2 via TOPICAL

## 2018-08-29 MED ORDER — SODIUM CHLORIDE 0.9 % IV SOLN: INTRAVENOUS | Status: DC

## 2018-08-29 MED ORDER — FENTANYL CITRATE (PF) 100 MCG/2ML IJ SOLN
INTRAMUSCULAR | Status: AC
  Filled 2018-08-29: qty 2

## 2018-08-29 MED ORDER — LABETALOL HCL 5 MG/ML IV SOLN: 10.0000 mg | INTRAVENOUS | Status: DC | PRN

## 2018-08-29 MED ORDER — FENTANYL CITRATE (PF) 100 MCG/2ML IJ SOLN
INTRAMUSCULAR | Status: DC | PRN
  Administered 2018-08-29: 25 ug via INTRAVENOUS

## 2018-08-29 MED ORDER — LIDOCAINE HCL (PF) 1 % IJ SOLN
INTRAMUSCULAR | Status: AC
  Filled 2018-08-29: qty 30

## 2018-08-29 MED ORDER — DILTIAZEM HCL ER COATED BEADS 240 MG PO CP24: 240.0000 mg | ORAL_CAPSULE | Freq: Every day | ORAL | Status: DC

## 2018-08-29 MED ORDER — HEPARIN (PORCINE) IN NACL 1000-0.9 UT/500ML-% IV SOLN
INTRAVENOUS | Status: AC
  Filled 2018-08-29: qty 1000

## 2018-08-29 MED ORDER — MIDAZOLAM HCL (PF) 5 MG/ML IJ SOLN
INTRAMUSCULAR | Status: AC
  Filled 2018-08-29: qty 2

## 2018-08-29 MED ORDER — SODIUM CHLORIDE 0.9 % WEIGHT BASED INFUSION
3.0000 mL/kg/h | INTRAVENOUS | Status: AC
  Administered 2018-08-29: 3 mL/kg/h via INTRAVENOUS

## 2018-08-29 MED ORDER — SODIUM CHLORIDE 0.9% FLUSH: 3.0000 mL | INTRAVENOUS | Status: DC | PRN

## 2018-08-29 MED ORDER — HEPARIN SODIUM (PORCINE) 1000 UNIT/ML IJ SOLN
INTRAMUSCULAR | Status: AC
  Filled 2018-08-29: qty 1

## 2018-08-29 MED ORDER — SODIUM CHLORIDE 0.9% FLUSH: 3.0000 mL | Freq: Two times a day (BID) | INTRAVENOUS | Status: DC

## 2018-08-29 MED ORDER — VALACYCLOVIR HCL 500 MG PO TABS: 1000.0000 mg | ORAL_TABLET | Freq: Every day | ORAL | Status: DC | PRN

## 2018-08-29 MED ORDER — SODIUM CHLORIDE 0.9 % WEIGHT BASED INFUSION
1.0000 mL/kg/h | INTRAVENOUS | Status: DC
  Administered 2018-08-29: 1 mL/kg/h via INTRAVENOUS

## 2018-08-29 MED ORDER — DIPHENHYDRAMINE HCL 50 MG/ML IJ SOLN
INTRAMUSCULAR | Status: AC
  Filled 2018-08-29: qty 1

## 2018-08-29 MED ORDER — ASPIRIN 81 MG PO CHEW: 81.0000 mg | CHEWABLE_TABLET | ORAL | Status: DC

## 2018-08-29 MED ORDER — HEPARIN (PORCINE) IN NACL 1000-0.9 UT/500ML-% IV SOLN
INTRAVENOUS | Status: DC | PRN
  Administered 2018-08-29 (×2): 500 mL

## 2018-08-29 MED ORDER — MIDAZOLAM HCL (PF) 10 MG/2ML IJ SOLN
INTRAMUSCULAR | Status: DC | PRN
  Administered 2018-08-29 (×2): 1 mg via INTRAVENOUS
  Administered 2018-08-29 (×2): 2 mg via INTRAVENOUS

## 2018-08-29 MED ORDER — ONDANSETRON HCL 4 MG/2ML IJ SOLN: 4.0000 mg | Freq: Four times a day (QID) | INTRAMUSCULAR | Status: DC | PRN

## 2018-08-29 MED ORDER — MIDAZOLAM HCL 2 MG/2ML IJ SOLN
INTRAMUSCULAR | Status: DC | PRN
  Administered 2018-08-29: 1 mg via INTRAVENOUS

## 2018-08-29 MED ORDER — VERAPAMIL HCL 2.5 MG/ML IV SOLN
INTRAVENOUS | Status: AC
  Filled 2018-08-29: qty 2

## 2018-08-29 MED ORDER — MORPHINE SULFATE (PF) 10 MG/ML IV SOLN: 2.0000 mg | INTRAVENOUS | Status: DC | PRN

## 2018-08-29 MED ORDER — HEPARIN SODIUM (PORCINE) 1000 UNIT/ML IJ SOLN
INTRAMUSCULAR | Status: DC | PRN
  Administered 2018-08-29: 3000 [IU] via INTRAVENOUS

## 2018-08-29 MED ORDER — METOPROLOL TARTRATE 25 MG PO TABS: 25.0000 mg | ORAL_TABLET | Freq: Two times a day (BID) | ORAL | Status: DC

## 2018-08-29 MED ORDER — APIXABAN 5 MG PO TABS: 5.0000 mg | ORAL_TABLET | Freq: Two times a day (BID) | ORAL | Status: DC

## 2018-08-29 MED ORDER — VERAPAMIL HCL 2.5 MG/ML IV SOLN
INTRAVENOUS | Status: DC | PRN
  Administered 2018-08-29: 10 mL via INTRA_ARTERIAL

## 2018-08-29 MED ORDER — ACETAMINOPHEN 325 MG PO TABS: 650.0000 mg | ORAL_TABLET | ORAL | Status: DC | PRN

## 2018-08-29 MED ORDER — LIDOCAINE HCL (PF) 1 % IJ SOLN
INTRAMUSCULAR | Status: DC | PRN
  Administered 2018-08-29 (×2): 2 mL

## 2018-08-29 MED ORDER — MIDAZOLAM HCL 2 MG/2ML IJ SOLN
INTRAMUSCULAR | Status: AC
  Filled 2018-08-29: qty 2

## 2018-08-29 MED ORDER — HYDRALAZINE HCL 20 MG/ML IJ SOLN: 10.0000 mg | INTRAMUSCULAR | Status: DC | PRN

## 2018-08-29 SURGICAL SUPPLY — 15 items
CATH BALLN WEDGE 5F 110CM (CATHETERS) ×1 IMPLANT
CATH INFINITI 5FR ANG PIGTAIL (CATHETERS) ×1 IMPLANT
CATH OPTITORQUE TIG 4.0 5F (CATHETERS) ×1 IMPLANT
DEVICE RAD COMP TR BAND LRG (VASCULAR PRODUCTS) ×1 IMPLANT
GLIDESHEATH SLEND A-KIT 6F 22G (SHEATH) ×1 IMPLANT
GUIDEWIRE INQWIRE 1.5J.035X260 (WIRE) IMPLANT
INQWIRE 1.5J .035X260CM (WIRE) ×2
KIT HEART LEFT (KITS) ×2 IMPLANT
PACK CARDIAC CATHETERIZATION (CUSTOM PROCEDURE TRAY) ×2 IMPLANT
SHEATH GLIDE SLENDER 4/5FR (SHEATH) ×1 IMPLANT
TRANSDUCER W/STOPCOCK (MISCELLANEOUS) ×3 IMPLANT
TUBING ART PRESS 72  MALE/FEM (TUBING) ×1
TUBING ART PRESS 72 MALE/FEM (TUBING) IMPLANT
TUBING CIL FLEX 10 FLL-RA (TUBING) ×2 IMPLANT
WIRE HI TORQ VERSACORE-J 145CM (WIRE) ×1 IMPLANT

## 2018-08-29 NOTE — Discharge Instructions (Signed)
MAY RESUME ELIQUIS TOMORROW    Radial Site Care  This sheet gives you information about how to care for yourself after your procedure. Your health care provider may also give you more specific instructions. If you have problems or questions, contact your health care provider. What can I expect after the procedure? After the procedure, it is common to have:  Bruising and tenderness at the catheter insertion area. Follow these instructions at home: Medicines  Take over-the-counter and prescription medicines only as told by your health care provider. Insertion site care  Follow instructions from your health care provider about how to take care of your insertion site. Make sure you: ? Wash your hands with soap and water before you change your bandage (dressing). If soap and water are not available, use hand sanitizer. ? Change your dressing as told by your health care provider. ? Leave stitches (sutures), skin glue, or adhesive strips in place. These skin closures may need to stay in place for 2 weeks or longer. If adhesive strip edges start to loosen and curl up, you may trim the loose edges. Do not remove adhesive strips completely unless your health care provider tells you to do that.  Check your insertion site every day for signs of infection. Check for: ? Redness, swelling, or pain. ? Fluid or blood. ? Pus or a bad smell. ? Warmth.  Do not take baths, swim, or use a hot tub until your health care provider approves.  You may shower 24-48 hours after the procedure, or as directed by your health care provider. ? Remove the dressing and gently wash the site with plain soap and water. ? Pat the area dry with a clean towel. ? Do not rub the site. That could cause bleeding.  Do not apply powder or lotion to the site. Activity   For 24 hours after the procedure, or as directed by your health care provider: ? Do not flex or bend the affected arm. ? Do not push or pull heavy objects  with the affected arm. ? Do not drive yourself home from the hospital or clinic. You may drive 24 hours after the procedure unless your health care provider tells you not to. ? Do not operate machinery or power tools.  Do not lift anything that is heavier than 10 lb (4.5 kg), or the limit that you are told, until your health care provider says that it is safe.  Ask your health care provider when it is okay to: ? Return to work or school. ? Resume usual physical activities or sports. ? Resume sexual activity. General instructions  If the catheter site starts to bleed, raise your arm and put firm pressure on the site. If the bleeding does not stop, get help right away. This is a medical emergency.  If you went home on the same day as your procedure, a responsible adult should be with you for the first 24 hours after you arrive home.  Keep all follow-up visits as told by your health care provider. This is important. Contact a health care provider if:  You have a fever.  You have redness, swelling, or yellow drainage around your insertion site. Get help right away if:  You have unusual pain at the radial site.  The catheter insertion area swells very fast.  The insertion area is bleeding, and the bleeding does not stop when you hold steady pressure on the area.  Your arm or hand becomes pale, cool, tingly, or numb.  These symptoms may represent a serious problem that is an emergency. Do not wait to see if the symptoms will go away. Get medical help right away. Call your local emergency services (911 in the [place] Do not drive yourself to the hospital. Summary  After the procedure, it is common to have bruising and tenderness at the site.  Follow instructions from your health care provider about how to take care of your radial site wound. Check the wound every day for signs of infection.  Do not lift anything that is heavier than 10 lb (4.5 kg), or the limit that you are told, until  your health care provider says that it is safe. This information is not intended to replace advice given to you by your health care provider. Make sure you discuss any questions you have with your health care provider. Document Released: 04/29/2010 Document Revised: 05/02/2017 Document Reviewed: 05/02/2017 Elsevier Interactive Patient Education  2019 [place].    Moderate Conscious Sedation, Adult, Care After These instructions provide you with information about caring for yourself after your procedure. Your health care provider may also give you more specific instructions. Your treatment has been planned according to current medical practices, but problems sometimes occur. Call your health care provider if you have any problems or questions after your procedure. What can I expect after the procedure? After your procedure, it is common:  To feel sleepy for several hours.  To feel clumsy and have poor balance for several hours.  To have poor judgment for several hours.  To vomit if you eat too soon. Follow these instructions at home: For at least 24 hours after the procedure:   Do not: ? Participate in activities where you could fall or become injured. ? Drive. ? Use heavy machinery. ? Drink alcohol. ? Take sleeping pills or medicines that cause drowsiness. ? Make important decisions or sign legal documents. ? Take care of children on your own.  Rest. Eating and drinking  Follow the diet recommended by your health care provider.  If you vomit: ? Drink water, juice, or soup when you can drink without vomiting. ? Make sure you have little or no nausea before eating solid foods. General instructions  Have a responsible adult stay with you until you are awake and alert.  Take over-the-counter and prescription medicines only as told by your health care provider.  If you smoke, do not smoke without supervision.  Keep all follow-up visits as told by your health care  provider. This is important. Contact a health care provider if:  You keep feeling nauseous or you keep vomiting.  You feel light-headed.  You develop a rash.  You have a fever. Get help right away if:  You have trouble breathing. This information is not intended to replace advice given to you by your health care provider. Make sure you discuss any questions you have with your health care provider. Document Released: 01/15/2013 Document Revised: 08/30/2015 Document Reviewed: 07/17/2015 Elsevier Interactive Patient Education  2019 [place].    Transesophageal Echocardiogram Transesophageal echocardiogram (TEE) is a test that uses sound waves to take pictures of your heart. TEE is done by passing a flexible tube down the esophagus. The esophagus is the tube that carries food from the throat to the stomach. The pictures give detailed images of your heart. This can help your doctor see if there are problems with your heart. What happens before the procedure? Staying hydrated Follow instructions from your doctor about hydration, which  may include:  Up to 3 hours before the procedure - you may continue to drink clear liquids, such as: ? Water. ? Clear fruit juice. ? Black coffee. ? Plain tea.  Eating and drinking Follow instructions from your doctor about eating and drinking, which may include:  8 hours before the procedure - stop eating heavy meals or foods such as meat, fried foods, or fatty foods.  6 hours before the procedure - stop eating light meals or foods, such as toast or cereal.  6 hours before the procedure - stop drinking milk or drinks that contain milk.  3 hours before the procedure - stop drinking clear liquids. General instructions  You will need to take out any dentures or retainers.  Plan to have someone take you home from the hospital or clinic.  If you will be going home right after the procedure, plan to have someone with you for 24 hours.  Ask  your doctor about: ? Changing or stopping your normal medicines. This is important if you take diabetes medicines or blood thinners. ? Taking over-the-counter medicines, vitamins, herbs, and supplements. ? Taking medicines such as aspirin and ibuprofen. These medicines can thin your blood. Do not take these medicines unless your doctor tells you to take them. What happens during the procedure?  To lower your risk of infection, your doctors will wash or clean their hands.  An IV will be put into one of your veins.  You will be given a medicine to help you relax (sedative).  A medicine may be sprayed or gargled. This numbs the back of your throat.  Your blood pressure, heart rate, and breathing will be watched.  You may be asked to lay on your left side.  A bite block will be placed in your mouth. This keeps you from biting the tube.  The tip of the TEE probe will be placed into the back of your mouth.  You will be asked to swallow.  Your doctor will take pictures of your heart.  The probe and bite block will be taken out. The procedure may vary among doctors and hospitals. What happens after the procedure?   Your blood pressure, heart rate, breathing rate, and blood oxygen level will be watched until the medicines you were given have worn off.  When you first wake up, your throat may feel sore and numb. This will get better over time. You will not be allowed to eat or drink until the numbness has gone away.  Do not drive for 24 hours if you were given a medicine to help you relax. Summary  TEE is a test that uses sound waves to take pictures of your heart.  You will be given a medicine to help you relax.  Do not drive for 24 hours if you were given a medicine to help you relax. This information is not intended to replace advice given to you by your health care provider. Make sure you discuss any questions you have with your health care provider. Document Released:  01/22/2009 Document Revised: 12/14/2017 Document Reviewed: 06/28/2016 Elsevier Interactive Patient Education  2019 [employer].

## 2018-08-29 NOTE — Interval H&P Note (Signed)
Cath Lab Visit (complete for each Cath Lab visit)  Clinical Evaluation Leading to the Procedure:   ACS: No.  Non-ACS:    Anginal Classification: No Symptoms  Anti-ischemic medical therapy: Minimal Therapy (1 class of medications)  Non-Invasive Test Results: No non-invasive testing performed  Prior CABG: No previous CABG      History and Physical Interval Note:  08/29/2018 12:44 PM  Hannah Roy  has presented today for surgery, with the diagnosis of valve heart disease.  The various methods of treatment have been discussed with the patient and family. After consideration of risks, benefits and other options for treatment, the patient has consented to  Procedure(s): RIGHT/LEFT HEART CATH AND CORONARY ANGIOGRAPHY (N/A) as a surgical intervention.  The patient's history has been reviewed, patient examined, no change in status, stable for surgery.  I have reviewed the patient's chart and labs.  Questions were answered to the patient's satisfaction.     Quay Burow

## 2018-08-29 NOTE — Progress Notes (Signed)
Pt to procedure area for cath. 

## 2018-08-29 NOTE — Progress Notes (Signed)
Received pt from Endo procedure alert and oriented X4, skin warm and dry , denying any discomfort.  Pt on  Monitor and VS WNL.  Pt on stretcher waiting for cath procedure. Consent signed

## 2018-08-29 NOTE — CV Procedure (Signed)
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 Trileaflet Moderate aortic regurgitation Mild aortic stenosis Mild pulmonic regurgitation Moderate tricuspid regurgitation  For additional details see full report.  During this procedure the patient is administered a total of Versed 6 mg and Fentanyl 87.5 mcg to achieve and maintain moderate conscious sedation.  The patient's heart rate, blood pressure, and oxygen saturation are monitored continuously during the procedure. The period of conscious sedation is 32 minutes, of which I was present face-to-face 100% of this time.  Derin Matthes C. Oval Linsey, MD, [facility] 08/29/2018 9:26 AM

## 2018-08-29 NOTE — Progress Notes (Signed)
Patient took 81 mg ASA prior to arrival to hospital tooday

## 2018-08-29 NOTE — Progress Notes (Signed)
TCTS consulted for MVR evaluation as an outpt.

## 2018-08-29 NOTE — Interval H&P Note (Signed)
History and Physical Interval Note:  08/29/2018 8:12 AM  Hannah Roy  has presented today for surgery, with the diagnosis of PERSISTENT A-FIB AND VALVULAR HEART DISEASE.  The various methods of treatment have been discussed with the patient and family. After consideration of risks, benefits and other options for treatment, the patient has consented to  Procedure(s): TRANSESOPHAGEAL ECHOCARDIOGRAM (TEE) (N/A) as a surgical intervention.  The patient's history has been reviewed, patient examined, no change in status, stable for surgery.  I have reviewed the patient's chart and labs.  Questions were answered to the patient's satisfaction.     Skeet Latch, MD

## 2018-08-30 ENCOUNTER — Encounter (HOSPITAL_COMMUNITY): Payer: Self-pay | Admitting: Cardiovascular Disease

## 2018-08-30 ENCOUNTER — Telehealth: Payer: Self-pay | Admitting: Cardiovascular Disease

## 2018-08-30 NOTE — Telephone Encounter (Signed)
Patient called asking for test results.

## 2018-08-31 ENCOUNTER — Emergency Department (HOSPITAL_COMMUNITY): Payer: Medicare Other

## 2018-08-31 ENCOUNTER — Inpatient Hospital Stay (HOSPITAL_COMMUNITY)
Admission: EM | Admit: 2018-08-31 | Discharge: 2018-09-13 | DRG: 219 | Disposition: A | Discharge status: Home / Self Care | Payer: Medicare Other | Attending: Thoracic Surgery (Cardiothoracic Vascular Surgery) | Admitting: Thoracic Surgery (Cardiothoracic Vascular Surgery)

## 2018-08-31 ENCOUNTER — Other Ambulatory Visit: Payer: Self-pay

## 2018-08-31 ENCOUNTER — Encounter (HOSPITAL_COMMUNITY): Payer: Self-pay | Admitting: Emergency Medicine

## 2018-08-31 DIAGNOSIS — I10 Essential (primary) hypertension: Secondary | ICD-10-CM | POA: Diagnosis present

## 2018-08-31 DIAGNOSIS — Z87891 Personal history of nicotine dependence: Secondary | ICD-10-CM

## 2018-08-31 DIAGNOSIS — R0602 Shortness of breath: Secondary | ICD-10-CM | POA: Diagnosis not present

## 2018-08-31 DIAGNOSIS — I4819 Other persistent atrial fibrillation: Secondary | ICD-10-CM | POA: Diagnosis not present

## 2018-08-31 DIAGNOSIS — Z953 Presence of xenogenic heart valve: Secondary | ICD-10-CM

## 2018-08-31 DIAGNOSIS — I4892 Unspecified atrial flutter: Secondary | ICD-10-CM | POA: Diagnosis not present

## 2018-08-31 DIAGNOSIS — I083 Combined rheumatic disorders of mitral, aortic and tricuspid valves: Secondary | ICD-10-CM | POA: Diagnosis present

## 2018-08-31 DIAGNOSIS — Z01818 Encounter for other preprocedural examination: Secondary | ICD-10-CM

## 2018-08-31 DIAGNOSIS — G4700 Insomnia, unspecified: Secondary | ICD-10-CM | POA: Diagnosis not present

## 2018-08-31 DIAGNOSIS — I099 Rheumatic heart disease, unspecified: Secondary | ICD-10-CM | POA: Diagnosis not present

## 2018-08-31 DIAGNOSIS — E876 Hypokalemia: Secondary | ICD-10-CM | POA: Diagnosis not present

## 2018-08-31 DIAGNOSIS — D6959 Other secondary thrombocytopenia: Secondary | ICD-10-CM | POA: Diagnosis not present

## 2018-08-31 DIAGNOSIS — I951 Orthostatic hypotension: Principal | ICD-10-CM | POA: Diagnosis present

## 2018-08-31 DIAGNOSIS — I5033 Acute on chronic diastolic (congestive) heart failure: Secondary | ICD-10-CM | POA: Diagnosis not present

## 2018-08-31 DIAGNOSIS — I2510 Atherosclerotic heart disease of native coronary artery without angina pectoris: Secondary | ICD-10-CM | POA: Diagnosis present

## 2018-08-31 DIAGNOSIS — I272 Pulmonary hypertension, unspecified: Secondary | ICD-10-CM | POA: Diagnosis not present

## 2018-08-31 DIAGNOSIS — R42 Dizziness and giddiness: Secondary | ICD-10-CM | POA: Diagnosis not present

## 2018-08-31 DIAGNOSIS — M810 Age-related osteoporosis without current pathological fracture: Secondary | ICD-10-CM | POA: Diagnosis present

## 2018-08-31 DIAGNOSIS — I351 Nonrheumatic aortic (valve) insufficiency: Secondary | ICD-10-CM | POA: Diagnosis present

## 2018-08-31 DIAGNOSIS — R079 Chest pain, unspecified: Secondary | ICD-10-CM | POA: Diagnosis not present

## 2018-08-31 DIAGNOSIS — I440 Atrioventricular block, first degree: Secondary | ICD-10-CM | POA: Diagnosis not present

## 2018-08-31 DIAGNOSIS — R270 Ataxia, unspecified: Secondary | ICD-10-CM | POA: Diagnosis not present

## 2018-08-31 DIAGNOSIS — J9811 Atelectasis: Secondary | ICD-10-CM

## 2018-08-31 DIAGNOSIS — I110 Hypertensive heart disease with heart failure: Secondary | ICD-10-CM | POA: Diagnosis not present

## 2018-08-31 DIAGNOSIS — Z1159 Encounter for screening for other viral diseases: Secondary | ICD-10-CM | POA: Diagnosis not present

## 2018-08-31 DIAGNOSIS — D62 Acute posthemorrhagic anemia: Secondary | ICD-10-CM | POA: Diagnosis not present

## 2018-08-31 DIAGNOSIS — I442 Atrioventricular block, complete: Secondary | ICD-10-CM | POA: Diagnosis not present

## 2018-08-31 DIAGNOSIS — I77810 Thoracic aortic ectasia: Secondary | ICD-10-CM | POA: Diagnosis present

## 2018-08-31 DIAGNOSIS — I052 Rheumatic mitral stenosis with insufficiency: Secondary | ICD-10-CM | POA: Diagnosis present

## 2018-08-31 DIAGNOSIS — E039 Hypothyroidism, unspecified: Secondary | ICD-10-CM | POA: Diagnosis not present

## 2018-08-31 DIAGNOSIS — R1310 Dysphagia, unspecified: Secondary | ICD-10-CM | POA: Diagnosis present

## 2018-08-31 DIAGNOSIS — F410 Panic disorder [episodic paroxysmal anxiety] without agoraphobia: Secondary | ICD-10-CM | POA: Diagnosis not present

## 2018-08-31 DIAGNOSIS — Z952 Presence of prosthetic heart valve: Secondary | ICD-10-CM

## 2018-08-31 DIAGNOSIS — I700 Atherosclerosis of aorta: Secondary | ICD-10-CM | POA: Diagnosis not present

## 2018-08-31 DIAGNOSIS — I071 Rheumatic tricuspid insufficiency: Secondary | ICD-10-CM | POA: Diagnosis present

## 2018-08-31 DIAGNOSIS — Z7901 Long term (current) use of anticoagulants: Secondary | ICD-10-CM | POA: Diagnosis not present

## 2018-08-31 DIAGNOSIS — Z8719 Personal history of other diseases of the digestive system: Secondary | ICD-10-CM

## 2018-08-31 DIAGNOSIS — R072 Precordial pain: Secondary | ICD-10-CM

## 2018-08-31 DIAGNOSIS — Z9889 Other specified postprocedural states: Secondary | ICD-10-CM

## 2018-08-31 DIAGNOSIS — I4891 Unspecified atrial fibrillation: Secondary | ICD-10-CM | POA: Diagnosis present

## 2018-08-31 DIAGNOSIS — I712 Thoracic aortic aneurysm, without rupture: Secondary | ICD-10-CM | POA: Diagnosis present

## 2018-08-31 DIAGNOSIS — I959 Hypotension, unspecified: Secondary | ICD-10-CM | POA: Diagnosis present

## 2018-08-31 DIAGNOSIS — I340 Nonrheumatic mitral (valve) insufficiency: Secondary | ICD-10-CM | POA: Diagnosis not present

## 2018-08-31 DIAGNOSIS — Z79899 Other long term (current) drug therapy: Secondary | ICD-10-CM

## 2018-08-31 DIAGNOSIS — R000 Tachycardia, unspecified: Secondary | ICD-10-CM | POA: Diagnosis not present

## 2018-08-31 DIAGNOSIS — Z8679 Personal history of other diseases of the circulatory system: Secondary | ICD-10-CM

## 2018-08-31 DIAGNOSIS — Z20828 Contact with and (suspected) exposure to other viral communicable diseases: Secondary | ICD-10-CM | POA: Diagnosis not present

## 2018-08-31 HISTORY — DX: Other persistent atrial fibrillation: I48.19

## 2018-08-31 HISTORY — DX: Hypothyroidism, unspecified: E03.9

## 2018-08-31 HISTORY — DX: Thoracic aortic ectasia: I77.810

## 2018-08-31 LAB — BASIC METABOLIC PANEL
Anion gap: 12 (ref 5–15)
BUN: 15 mg/dL (ref 8–23)
CO2: 20 mmol/L — ABNORMAL LOW (ref 22–32)
Calcium: 9 mg/dL (ref 8.9–10.3)
Chloride: 109 mmol/L (ref 98–111)
Creatinine, Ser: 0.71 mg/dL (ref 0.44–1.00)
GFR calc Af Amer: >60 mL/min (ref >60)
GFR calc non Af Amer: >60 mL/min (ref >60)
Glucose, Bld: 110 mg/dL — ABNORMAL HIGH (ref 70–99)
Potassium: 3.7 mmol/L (ref 3.5–5.1)
Sodium: 141 mmol/L (ref 135–145)

## 2018-08-31 LAB — CBC
HCT: 40.4 % (ref 36.0–46.0)
Hemoglobin: 13.8 g/dL (ref 12.0–15.0)
MCH: 31.7 pg (ref 26.0–34.0)
MCHC: 34.2 g/dL (ref 30.0–36.0)
MCV: 92.9 fL (ref 80.0–100.0)
Platelets: 239 10*3/uL (ref 150–400)
RBC: 4.35 MIL/uL (ref 3.87–5.11)
RDW: 12.1 % (ref 11.5–15.5)
WBC: 5.6 10*3/uL (ref 4.0–10.5)
nRBC: 0 % (ref 0.0–0.2)

## 2018-08-31 LAB — TSH: TSH: 3.399 u[IU]/mL (ref 0.350–4.500)

## 2018-08-31 LAB — TROPONIN I
Troponin I: <0.03 ng/mL (ref <0.03)
Troponin I: <0.03 ng/mL (ref <0.03)

## 2018-08-31 LAB — BRAIN NATRIURETIC PEPTIDE: B Natriuretic Peptide: 549 pg/mL — ABNORMAL HIGH (ref 0.0–100.0)

## 2018-08-31 LAB — SARS CORONAVIRUS 2 BY RT PCR (HOSPITAL ORDER, PERFORMED IN ~~LOC~~ HOSPITAL LAB): SARS Coronavirus 2: NEGATIVE

## 2018-08-31 MED ORDER — LOSARTAN POTASSIUM 25 MG PO TABS
25.0000 mg | ORAL_TABLET | Freq: Every day | ORAL | Status: DC
  Administered 2018-09-01 – 2018-09-04 (×4): 25 mg via ORAL
  Filled 2018-08-31 (×4): qty 1

## 2018-08-31 MED ORDER — IOHEXOL 350 MG/ML SOLN
75.0000 mL | Freq: Once | INTRAVENOUS | Status: AC | PRN
  Administered 2018-08-31: 17:00:00 via INTRAVENOUS

## 2018-08-31 MED ORDER — APIXABAN 5 MG PO TABS
5.0000 mg | ORAL_TABLET | Freq: Two times a day (BID) | ORAL | Status: DC
  Administered 2018-08-31: 23:00:00 5 mg via ORAL
  Filled 2018-08-31: qty 1

## 2018-08-31 MED ORDER — SODIUM CHLORIDE 0.9 % IV BOLUS: 250.0000 mL | Freq: Once | INTRAVENOUS | Status: DC

## 2018-08-31 MED ORDER — SODIUM CHLORIDE 0.9 % IV BOLUS
250.0000 mL | Freq: Once | INTRAVENOUS | Status: AC
  Administered 2018-08-31: 10:00:00 250 mL via INTRAVENOUS

## 2018-08-31 MED ORDER — ONDANSETRON HCL 4 MG/2ML IJ SOLN
4.0000 mg | Freq: Four times a day (QID) | INTRAMUSCULAR | Status: DC | PRN
  Administered 2018-09-05: 4 mg via INTRAVENOUS
  Filled 2018-08-31: qty 2

## 2018-08-31 MED ORDER — METOPROLOL TARTRATE 25 MG PO TABS
25.0000 mg | ORAL_TABLET | Freq: Two times a day (BID) | ORAL | Status: DC
  Administered 2018-09-01 – 2018-09-04 (×8): 25 mg via ORAL
  Filled 2018-08-31 (×8): qty 1

## 2018-08-31 MED ORDER — LORAZEPAM 2 MG/ML IJ SOLN
0.5000 mg | Freq: Once | INTRAMUSCULAR | Status: AC
  Administered 2018-08-31: 15:00:00 0.5 mg via INTRAVENOUS
  Filled 2018-08-31: qty 1

## 2018-08-31 MED ORDER — SODIUM CHLORIDE 0.9 % IV SOLN
INTRAVENOUS | Status: DC
  Administered 2018-08-31 – 2018-09-01 (×3): via INTRAVENOUS

## 2018-08-31 MED ORDER — DILTIAZEM HCL 25 MG/5ML IV SOLN
10.0000 mg | Freq: Once | INTRAVENOUS | Status: AC
  Administered 2018-08-31: 13:00:00 5 mg via INTRAVENOUS
  Filled 2018-08-31: qty 5

## 2018-08-31 MED ORDER — ONDANSETRON HCL 4 MG PO TABS: 4.0000 mg | ORAL_TABLET | Freq: Four times a day (QID) | ORAL | Status: DC | PRN

## 2018-08-31 MED ORDER — DILTIAZEM HCL ER COATED BEADS 240 MG PO CP24
240.0000 mg | ORAL_CAPSULE | Freq: Every day | ORAL | Status: DC
  Administered 2018-09-01 – 2018-09-04 (×4): 240 mg via ORAL
  Filled 2018-08-31 (×4): qty 1

## 2018-08-31 NOTE — ED Triage Notes (Signed)
C/o chest pain, SOB and dizziness with position change.  Currently c/o chest tightness.  Denies pain at this time.

## 2018-08-31 NOTE — ED Notes (Signed)
[facility] called for transport to [facility], no trucks available at this time but will send truck when available

## 2018-08-31 NOTE — H&P (Signed)
History and Physical  Hannah Roy DVV:[id number] DOB: [birth year] DOA: 08/31/2018  Referring physician: Dr Thurnell Garbe, ED physician PCP: Kathyrn Drown, MD  Outpatient Specialists:   Patient Coming From: home  Chief Complaint: dizziness  HPI: Hannah Roy is a 78 y.o. female with a history of rheumatic heart disease, atrial fibrillation with RVR, hypertension.  Patient seen for orthostasis that started this morning and has been progressively worse.  Worsened with movement such as standing up and walking around.  Improved with rest.  No other palliating or provoking factors.    In reviewing chart, the patient was seen on 5/1 in the emergency department due to A. fib with RVR and had a cardioversion.  She was sent home and followed up with audiology on 5/14.  At that time.  She was complaining of chest discomfort and indigestion with dizziness and exertional dyspnea and has been having fluctuating heart rates.  At that time, metoprolol was added to her regimen.  She was admitted to the hospital on 5/21 for a TEE which showed a normal EF with normal wall motion.  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  Cath was also done showing noncritical coronary artery disease with high V wave and filling pressures.  Follow-up with Dr. Roxy Manns was recommended for consult of multivalve replacement.  Emergency Department Course: Patient found to be orthostatic with significant drop in her systolic blood pressure to 72/54 with a heart rate of 120 upon standing.  Cardiology was consulted and recommendation was to admit the patient on the triad hospitalist service and cardiology would consult on the patient upon arrival.    Review of Systems:  Pt denies any fevers, chills, nausea, vomiting, diarrhea, constipation, abdominal pain, shortness of breath, dyspnea on exertion, orthopnea, cough, wheezing, palpitations, headache, vision changes, lightheadedness, dizziness, melena, rectal bleeding.  Review of systems are otherwise  negative  Past Medical History:  Diagnosis Date   Aortic atherosclerosis ([place]) 05/07/2018   Atrial fibrillation ([place]) 05/15/2018   Colon polyps    History of seasonal allergies    Hypertension    Osteopenia 2006   Past Surgical History:  Procedure Laterality Date   APPENDECTOMY     COLONOSCOPY  03/23/2011   repeat in 5 years,Procedure: COLONOSCOPY;  Surgeon: Rogene Houston, MD;  [facility];  Service: Endoscopy;  Laterality: N/A;  1:00   COLONOSCOPY N/A 11/09/2016   Procedure: COLONOSCOPY;  Surgeon: Rogene Houston, MD;  [facility]: [facility];  Service: Endoscopy;  Laterality: N/A;  1030   RIGHT/LEFT HEART CATH AND CORONARY ANGIOGRAPHY N/A 08/29/2018   Procedure: RIGHT/LEFT HEART CATH AND CORONARY ANGIOGRAPHY;  Surgeon: Lorretta Harp, MD;  Location: [facility];  Service: Cardiovascular;  Laterality: N/A;   TEE WITHOUT CARDIOVERSION N/A 08/29/2018   Procedure: TRANSESOPHAGEAL ECHOCARDIOGRAM (TEE);  Surgeon: Skeet Latch, MD;  Location: [facility];  Service: Cardiovascular;  Laterality: N/A;   TUBAL LIGATION     Social History:  reports that she has quit smoking. She has a 5.00 pack-year smoking history. She has never used smokeless tobacco. She reports that she does not drink alcohol or use drugs. Patient lives at home  Allergies  Allergen Reactions   Bee Venom Anaphylaxis   Boniva [Ibandronic Acid]     Upset stomach, flu like symptoms    Lisinopril Cough   Latex Rash    Family History  Problem Relation Age of Onset   Colon cancer Daughter  Prior to Admission medications   Medication Sig Start Date End Date Taking? Authorizing Provider  apixaban (ELIQUIS) 5 MG TABS tablet Take 1 tablet (5 mg total) by mouth 2 (two) times daily for 30 days. Patient taking differently: Take 5 mg by mouth 2 (two) times daily. (0730 & 1930) 05/11/18 08/26/19 Yes Shah, Pratik D, DO  chlorpheniramine (CHLOR-TRIMETON) 4 MG tablet Take 4 mg by mouth  daily as needed for allergies.   Yes [provider]  diltiazem (CARTIA XT) 240 MG 24 hr capsule Take 1 capsule (240 mg total) by mouth daily. 05/11/18 05/11/19 Yes Shah, Pratik D, DO  losartan (COZAAR) 25 MG tablet Take 1 tablet (25 mg total) by mouth daily. 05/15/18  Yes Kathyrn Drown, MD  metoprolol tartrate (LOPRESSOR) 25 MG tablet Take 1 tablet (25 mg total) by mouth 2 (two) times daily. 08/20/18 11/18/18 Yes Sherran Needs, NP  valACYclovir (VALTREX) 1000 MG tablet Take 1,000 mg by mouth daily as needed (fever blisters.).     [provider]    Physical Exam: BP 114/78    Pulse (!) 151    Temp 98.1 F (36.7 C) (Oral)    Resp (!) 25    Ht 5\' 1"  (1.549 m)    Wt 54.4 kg    SpO2 100%    BMI 22.67 kg/m    General: Elderly Asian female. Awake and alert and oriented x3. No acute cardiopulmonary distress.   HEENT: Normocephalic atraumatic.  Right and left ears normal in appearance.  Pupils equal, round, reactive to light. Extraocular muscles are intact. Sclerae anicteric and noninjected.  Moist mucosal membranes. No mucosal lesions.   Neck: Neck supple without lymphadenopathy. No carotid bruits. No masses palpated.   Cardiovascular: Regularly irregular rate.  Murmur was not appreciated.  No rubs, gallops auscultated. No JVD.   Respiratory: Good respiratory effort with no wheezes, rales, rhonchi. Lungs clear to auscultation bilaterally.  No accessory muscle use.  Abdomen: Soft, nontender, nondistended. Active bowel sounds. No masses or hepatosplenomegaly   Skin: Healing wound to the right wrist from her previous heart cath 2 days ago.  No erythema.  No rashes, lesions, or ulcerations.  Dry, warm to touch. 2+ dorsalis pedis and radial pulses.  Musculoskeletal: No calf or leg pain. All major joints not erythematous nontender.  No upper or lower joint deformation.  Good ROM.  No contractures   Psychiatric: Intact judgment and insight. Pleasant and cooperative.  Neurologic: No  focal neurological deficits. Strength is 5/5 and symmetric in upper and lower extremities.  Cranial nerves II through XII are grossly intact.           Labs on Admission: I have personally reviewed following labs and imaging studies  CBC: Recent Labs  Lab 08/29/18 1321 08/29/18 1322 08/29/18 1325 08/31/18 0915  WBC  --   --   --  5.6  HGB 10.2* 10.2* 10.2* 13.8  HCT 30.0* 30.0* 30.0* 40.4  MCV  --   --   --  92.9  PLT  --   --   --  811   Basic Metabolic Panel: Recent Labs  Lab 08/29/18 1321 08/29/18 1322 08/29/18 1325 08/31/18 0915  NA 142 142 142 141  K 3.5 3.5 3.5 3.7  CL  --   --   --  109  CO2  --   --   --  20*  GLUCOSE  --   --   --  110*  BUN  --   --   --  15  CREATININE  --   --   --  0.71  CALCIUM  --   --   --  9.0   GFR: Estimated Creatinine Clearance: 45.8 mL/min (by C-G formula based on SCr of 0.71 mg/dL). Liver Function Tests: No results for input(s): AST, ALT, ALKPHOS, BILITOT, PROT, ALBUMIN in the last 168 hours. No results for input(s): LIPASE, AMYLASE in the last 168 hours. No results for input(s): AMMONIA in the last 168 hours. Coagulation Profile: No results for input(s): INR, PROTIME in the last 168 hours. Cardiac Enzymes: Recent Labs  Lab 08/31/18 0915 08/31/18 1451  TROPONINI <0.03 <0.03   BNP (last 3 results) No results for input(s): PROBNP in the last 8760 hours. HbA1C: No results for input(s): HGBA1C in the last 72 hours. CBG: No results for input(s): GLUCAP in the last 168 hours. Lipid Profile: No results for input(s): CHOL, HDL, LDLCALC, TRIG, CHOLHDL, LDLDIRECT in the last 72 hours. Thyroid Function Tests: No results for input(s): TSH, T4TOTAL, FREET4, T3FREE, THYROIDAB in the last 72 hours. Anemia Panel: No results for input(s): VITAMINB12, FOLATE, FERRITIN, TIBC, IRON, RETICCTPCT in the last 72 hours. Urine analysis:    Component Value Date/Time   COLORURINE STRAW (A) 04/21/2017 1253   APPEARANCEUR CLEAR 04/21/2017  1253   LABSPEC 1.005 04/21/2017 1253   PHURINE 7.0 04/21/2017 1253   GLUCOSEU NEGATIVE 04/21/2017 1253   HGBUR NEGATIVE 04/21/2017 1253   BILIRUBINUR NEGATIVE 04/21/2017 1253   KETONESUR NEGATIVE 04/21/2017 1253   PROTEINUR NEGATIVE 04/21/2017 1253   UROBILINOGEN 0.2 11/10/2010 2001   NITRITE NEGATIVE 04/21/2017 1253   LEUKOCYTESUR NEGATIVE 04/21/2017 1253   Sepsis Labs: @LABRCNTIP (procalcitonin:4,lacticidven:4) ) Recent Results (from the past 240 hour(s))  Novel Coronavirus, NAA (hospital order; send-out to ref lab)     Status: None   Collection Time: 08/26/18 11:02 AM  Result Value Ref Range Status   SARS-CoV-2, NAA NOT DETECTED NOT DETECTED Final    Comment: (NOTE) Testing was performed using the cobas(R) SARS-CoV-2 test. This test was developed and its performance characteristics determined by [employer]. This test has not been FDA cleared or approved. This test has been authorized by FDA under an Emergency Use Authorization (EUA). This test is only authorized for the duration of time the declaration that circumstances exist justifying the authorization of the emergency use of in vitro diagnostic tests for detection of SARS-CoV-2 virus and/or diagnosis of COVID-19 infection under section 564(b)(1) of the Act, 21 U.S.C. 614ERX-5(Q)(0), unless the authorization is terminated or revoked sooner. When diagnostic testing is negative, the possibility of a false negative result should be considered in the context of a patient's recent exposures and the presence of clinical signs and symptoms consistent with COVID-19. An individual without symptoms of COVID-19 and who is not shedding SARS-CoV-2 virus would expect to have  a negative (not detected) result in this assay. Performed At: [facility] [address] [place], [place] [id number] Rush Farmer MD DT:[id number]    [place]  Final    Comment: Performed at [facility],  [facility] [address] [place], [place] [address]  SARS Coronavirus 2 (CEPHEID - Performed in [place] hospital lab), Hosp Order     Status: None   Collection Time: 08/31/18  4:21 PM  Result Value Ref Range Status   SARS Coronavirus 2 NEGATIVE NEGATIVE Final    Comment: (NOTE) If result is NEGATIVE SARS-CoV-2 target nucleic acids are NOT DETECTED. The SARS-CoV-2 RNA is generally detectable in upper and lower  respiratory specimens during the acute phase of infection. The lowest  concentration of SARS-CoV-2 viral copies this assay can detect is 250  copies / mL. A negative result does not preclude SARS-CoV-2 infection  and should not be used as the sole basis for treatment or other  patient management decisions.  A negative result may occur with  improper specimen collection / handling, submission of specimen other  than nasopharyngeal swab, presence of viral mutation(s) within the  areas targeted by this assay, and inadequate number of viral copies  (<250 copies / mL). A negative result must be combined with clinical  observations, patient history, and epidemiological information. If result is POSITIVE SARS-CoV-2 target nucleic acids are DETECTED. The SARS-CoV-2 RNA is generally detectable in upper and lower  respiratory specimens dur ing the acute phase of infection.  Positive  results are indicative of active infection with SARS-CoV-2.  Clinical  correlation with patient history and other diagnostic information is  necessary to determine patient infection status.  Positive results do  not rule out bacterial infection or co-infection with other viruses. If result is PRESUMPTIVE POSTIVE SARS-CoV-2 nucleic acids MAY BE PRESENT.   A presumptive positive result was obtained on the submitted specimen  and confirmed on repeat testing.  While 2019 novel coronavirus  (SARS-CoV-2) nucleic acids may be present in the submitted sample  additional confirmatory testing may be necessary for  epidemiological  and / or clinical management purposes  to differentiate between  SARS-CoV-2 and other Sarbecovirus currently known to infect humans.  If clinically indicated additional testing with an alternate test  methodology [phone]) is advised. The SARS-CoV-2 RNA is generally  detectable in upper and lower respiratory sp ecimens during the acute  phase of infection. The expected result is Negative. Fact Sheet for Patients:  StrictlyIdeas.no Fact Sheet for Healthcare Providers: BankingDealers.co.za This test is not yet approved or cleared by the [place] FDA and has been authorized for detection and/or diagnosis of SARS-CoV-2 by FDA under an Emergency Use Authorization (EUA).  This EUA will remain in effect (meaning this test can be used) for the duration of the COVID-19 declaration under Section 564(b)(1) of the Act, 21 U.S.C. section 360bbb-3(b)(1), unless the authorization is terminated or revoked sooner. Performed at [facility], [address] [place], [place] [address]      Radiological Exams on Admission: Ct Head Wo Contrast  Result Date: 08/31/2018 CLINICAL DATA:  Ataxia, stroke suspected. Dizziness. EXAM: CT HEAD WITHOUT CONTRAST TECHNIQUE: Contiguous axial images were obtained from the base of the skull through the vertex without intravenous contrast. COMPARISON:  Brain MRI 10/30/2017 FINDINGS: Brain: There is no evidence of acute infarct, intracranial hemorrhage, mass, midline shift, or extra-axial fluid collection. Mild cerebral atrophy is within normal limits for age. Vascular: Calcified atherosclerosis at the skull base. No hyperdense vessel. Skull: No fracture. 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 Sinuses/Orbits: Partially visualized mild mucosal thickening in the maxillary and ethmoid sinuses. Clear mastoid air cells. Unremarkable orbits. Other: None. IMPRESSION: No evidence of acute intracranial abnormality.  Electronically Signed   By: Logan Bores M.D.   On: 08/31/2018 16:58   Ct Angio Chest Pe W/cm &/or Wo Cm  Result Date: 08/31/2018 CLINICAL DATA:  Chest pain, shortness of breath EXAM: CT ANGIOGRAPHY CHEST WITH CONTRAST TECHNIQUE: Multidetector CT imaging of the chest was performed using the standard protocol during bolus administration of intravenous contrast. Multiplanar CT image reconstructions and MIPs were obtained to evaluate the vascular anatomy. CONTRAST:  69 mL  Isovue 350 MG/ML SOLN COMPARISON:  06/11/2018 FINDINGS: Cardiovascular: Satisfactory opacification of the pulmonary arteries to the segmental level. No evidence of pulmonary embolism. 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 Cardiomegaly. No pericardial effusion. Mediastinum/Nodes: No enlarged mediastinal, hilar, or axillary lymph nodes. Thyroid gland, trachea, and esophagus demonstrate no significant findings. Lungs/Pleura: Lungs are clear. Redemonstrated post infectious pneumatocele of the left lung base. No pleural effusion or pneumothorax. Upper Abdomen: No acute abnormality. Musculoskeletal: No chest wall abnormality. No acute or significant osseous findings. Review of the MIP images confirms the above findings. IMPRESSION: 1.  Negative examination for pulmonary embolism. 2.  Cardiomegaly. 3. 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 Electronically Signed   By: Eddie Candle M.D.   On: 08/31/2018 17:11   Dg Chest Port 1 View  Result Date: 08/31/2018 CLINICAL DATA:  78 year old female with history of chest pain and shortness of breath. Dizziness. EXAM: PORTABLE CHEST 1 VIEW COMPARISON:  Chest x-ray 05/10/2018. FINDINGS: Lung volumes are normal. No consolidative airspace disease. No pleural effusions. No suspicious appearing pulmonary nodules or masses. No pneumothorax. No evidence of pulmonary edema.  Mild cardiomegaly. Upper mediastinal contours are within normal limits. Aortic atherosclerosis. IMPRESSION: 1. No radiographic evidence of acute cardiopulmonary disease. 2. Mild cardiomegaly. 3. Aortic atherosclerosis. Electronically Signed   By: Vinnie Langton M.D.   On: 08/31/2018 10:05    EKG: Independently reviewed.  A. fib with RVR.  LVH.  No acute ST changes.  Assessment/Plan: Principal Problem:   Orthostasis Active Problems:   Essential hypertension, benign   Atrial fibrillation with RVR (HCC)   Hypothyroidism   Rheumatic heart disease    This patient was discussed with the ED physician, including pertinent vitals, physical exam findings, labs, and imaging.  We also discussed care given by the ED provider.  1. Orthostasis a. Judicial fluids b. Observation at [facility] on telemetry c. May need blood pressure medications reconfigured 2. A. fib with RVR a. Cardiology to consult b. Check TSH 3. Hypothyroidism a. Check TSH 4. Rheumatic heart disease a.  5. Hypertension a. Cardiology to adjust blood pressure medications  DVT prophylaxis: On Eliquis Consultants: Cardiology -appreciate your input Code Status: Full code Family Communication: None Disposition Plan: Patient should be able to return home following improvement   Truett Mainland, DO

## 2018-08-31 NOTE — ED Notes (Signed)
Call to pt room  She reports her monitor is going off [employer]  Pt is in A fib, has no pain but appears quite anxious regarding monitor as she is watching it keenly

## 2018-08-31 NOTE — ED Notes (Signed)
[facility] here for transport.  

## 2018-08-31 NOTE — ED Notes (Signed)
Not able to ambulate due to changes in orthostatic BP, MD aware.

## 2018-08-31 NOTE — ED Notes (Signed)
Pt ambulated to the bathroom. Pt stated she was dizzy and SOB. O2 sats started at 97% and went down to 90%.

## 2018-08-31 NOTE — Consult Note (Signed)
Cardiology Consult    Patient ID: Hannah Roy MRN: [id number], DOB/AGE: 12-02-1940   Admit date: 08/31/2018 Date of Consult: 08/31/2018  Primary Physician: Kathyrn Drown, MD Primary Cardiologist: Kate Sable, MD   Past Medical History   Past Medical History:  Diagnosis Date  . Aortic atherosclerosis ([place]) 05/07/2018  . Atrial fibrillation ([place]) 05/15/2018  . Colon polyps   . History of seasonal allergies   . Hypertension   . Osteopenia 2006    Past Surgical History:  Procedure Laterality Date  . APPENDECTOMY    . COLONOSCOPY  03/23/2011   repeat in 5 years,Procedure: COLONOSCOPY;  Surgeon: Rogene Houston, MD;  [facility];  Service: Endoscopy;  Laterality: N/A;  1:00  . COLONOSCOPY N/A 11/09/2016   Procedure: COLONOSCOPY;  Surgeon: Rogene Houston, MD;  [facility];  Service: Endoscopy;  Laterality: N/A;  1030  . RIGHT/LEFT HEART CATH AND CORONARY ANGIOGRAPHY N/A 08/29/2018   Procedure: RIGHT/LEFT HEART CATH AND CORONARY ANGIOGRAPHY;  Surgeon: Lorretta Harp, MD;  Location: [facility];  Service: Cardiovascular;  Laterality: N/A;  . TEE WITHOUT CARDIOVERSION N/A 08/29/2018   Procedure: TRANSESOPHAGEAL ECHOCARDIOGRAM (TEE);  Surgeon: Skeet Latch, MD;  Location: [facility];  Service: Cardiovascular;  Laterality: N/A;  . TUBAL LIGATION       Allergies  Allergies  Allergen Reactions  . Bee Venom Anaphylaxis  . Boniva [Ibandronic Acid]     Upset stomach, flu like symptoms   . Lisinopril Cough  . Latex Rash    History of Present Illness    78 year old woman with a past medical history of A. fib starting in January.  She underwent an evaluation with ultrasound which found her to have aortic and and mitral valve disease mitral valve stenosis at this time point was not seen.  Her symptoms at that time point were palpitations which led to the diagnosis of A. fib that she also had falls for the preceding 2 years for which she was  diagnosed with vertigo and treated with oral medications.  From January to April she continued to have dizziness spells shortness of breath, panic attacks.  In May she then developed an episode of rapid A. fib and required cardioversion.  She underwent repeat evaluation now with a TEE and was found to have at least moderate mitral valve stenosis with a gradient of 9-10.  She also had a left and right a catheterization.  He was plan to see surgery as an outpatient.  Today however she presents with a repeat episode of dizziness and a panic attack and was brought to the emergency room.  Emergency room she was found to be orthostatic with blood pressures dropping from 469 to 70 systolic.  Transfer to [facility] was arranged  Inpatient Medications    . apixaban  5 mg Oral BID  . [START ON 09/01/2018] diltiazem  240 mg Oral Daily  . [START ON 09/01/2018] losartan  25 mg Oral Daily  . [START ON 09/01/2018] metoprolol tartrate  25 mg Oral BID    Family History    Family History  Problem Relation Age of Onset  . Colon cancer Daughter    She indicated that the status of her daughter is unknown.   Social History    Social History   Socioeconomic History  . Marital status: Married    Spouse name: Not on file  . Number of children: Not on file  . Years of education: Not on  file  . Highest education level: Not on file  Occupational History  . Not on file  Social Needs  . Financial resource strain: Not on file  . Food insecurity:    Worry: Not on file    Inability: Not on file  . Transportation needs:    Medical: Not on file    Non-medical: Not on file  Tobacco Use  . Smoking status: Former Smoker    Packs/day: 0.50    Years: 10.00    Pack years: 5.00  . Smokeless tobacco: Never Used  Substance and Sexual Activity  . Alcohol use: No  . Drug use: No  . Sexual activity: Not on file  Lifestyle  . Physical activity:    Days per week: Not on file    Minutes per session: Not on file  .  Stress: Not on file  Relationships  . Social connections:    Talks on phone: Not on file    Gets together: Not on file    Attends religious service: Not on file    Active member of club or organization: Not on file    Attends meetings of clubs or organizations: Not on file    Relationship status: Not on file  . Intimate partner violence:    Fear of current or ex partner: Not on file    Emotionally abused: Not on file    Physically abused: Not on file    Forced sexual activity: Not on file  Other Topics Concern  . Not on file  Social History Narrative  . Not on file     Review of Systems    General:  No chills, fever, night sweats or weight changes.  Cardiovascular:  No chest pain, dyspnea on exertion, edema, orthopnea, palpitations, paroxysmal nocturnal dyspnea. Dermatological: No rash, lesions/masses Respiratory: No cough, dyspnea Urologic: No hematuria, dysuria Abdominal:   No nausea, vomiting, diarrhea, bright red blood per rectum, melena, or hematemesis Neurologic:  No visual changes, wkns, changes in mental status. All other systems reviewed and are otherwise negative except as noted above.  Physical Exam    Blood pressure 109/74, pulse 86, temperature 97.8 F (36.6 C), temperature source Oral, resp. rate 20, height 5\' 1"  (1.549 m), weight 55.5 kg, SpO2 99 %.  General: Pleasant, NAD Psych: Normal affect. Neuro: Alert and oriented X 3. Moves all extremities spontaneously. HEENT: Normal  Neck: Supple without bruits or JVD. Lungs:  Resp regular and unlabored, CTA. Heart: Irregular, faint diastolic murmur.. Abdomen: Soft, non-tender, non-distended, BS + x 4.  Extremities: No clubbing, cyanosis or edema. DP/PT/Radials 2+ and equal bilaterally.  Labs    Troponin (Point of Care Test) No results for input(s): TROPIPOC in the last 72 hours. Recent Labs    08/31/18 0915 08/31/18 1451  TROPONINI <0.03 <0.03   Lab Results  Component Value Date   WBC 5.6 08/31/2018    HGB 13.8 08/31/2018   HCT 40.4 08/31/2018   MCV 92.9 08/31/2018   PLT 239 08/31/2018    Recent Labs  Lab 08/31/18 0915  NA 141  K 3.7  CL 109  CO2 20*  BUN 15  CREATININE 0.71  CALCIUM 9.0  GLUCOSE 110*   Lab Results  Component Value Date   CHOL 195 08/03/2017   HDL 64 08/03/2017   LDLCALC 110 (H) 08/03/2017   TRIG 107 08/03/2017   No results found for: [facility]   Radiology Studies    Ct Head Wo Contrast  Result Date: 08/31/2018 CLINICAL DATA:  Ataxia, stroke suspected. Dizziness. EXAM: CT HEAD WITHOUT CONTRAST TECHNIQUE: Contiguous axial images were obtained from the base of the skull through the vertex without intravenous contrast. COMPARISON:  Brain MRI 10/30/2017 FINDINGS: Brain: There is no evidence of acute infarct, intracranial hemorrhage, mass, midline shift, or extra-axial fluid collection. Mild cerebral atrophy is within normal limits for age. Vascular: Calcified atherosclerosis at the skull base. No hyperdense vessel. Skull: No fracture. 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 Sinuses/Orbits: Partially visualized mild mucosal thickening in the maxillary and ethmoid sinuses. Clear mastoid air cells. Unremarkable orbits. Other: None. IMPRESSION: No evidence of acute intracranial abnormality. Electronically Signed   By: Logan Bores M.D.   On: 08/31/2018 16:58   Ct Angio Chest Pe W/cm &/or Wo Cm  Result Date: 08/31/2018 CLINICAL DATA:  Chest pain, shortness of breath EXAM: CT ANGIOGRAPHY CHEST WITH CONTRAST TECHNIQUE: Multidetector CT imaging of the chest was performed using the standard protocol during bolus administration of intravenous contrast. Multiplanar CT image reconstructions and MIPs were obtained to evaluate the vascular anatomy. CONTRAST:  69 mL Isovue 350 MG/ML SOLN COMPARISON:  06/11/2018 FINDINGS: Cardiovascular: Satisfactory opacification of the pulmonary arteries to the segmental level. No evidence of pulmonary embolism. 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 Cardiomegaly. No pericardial effusion. Mediastinum/Nodes: No enlarged mediastinal, hilar, or axillary lymph nodes. Thyroid gland, trachea, and esophagus demonstrate no significant findings. Lungs/Pleura: Lungs are clear. Redemonstrated post infectious pneumatocele of the left lung base. No pleural effusion or pneumothorax. Upper Abdomen: No acute abnormality. Musculoskeletal: No chest wall abnormality. No acute or significant osseous findings. Review of the MIP images confirms the above findings. IMPRESSION: 1.  Negative examination for pulmonary embolism. 2.  Cardiomegaly. 3. 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 Electronically Signed   By: Eddie Candle M.D.   On: 08/31/2018 17:11   Dg Chest Port 1 View  Result Date: 08/31/2018 CLINICAL DATA:  78 year old female with history of chest pain and shortness of breath. Dizziness. EXAM: PORTABLE CHEST 1 VIEW COMPARISON:  Chest x-ray 05/10/2018. FINDINGS: Lung volumes are normal. No consolidative airspace disease. No pleural effusions. No suspicious appearing pulmonary nodules or masses. No pneumothorax. No evidence of pulmonary edema. Mild cardiomegaly. Upper mediastinal contours are within normal limits. Aortic atherosclerosis. IMPRESSION: 1. No radiographic evidence of acute cardiopulmonary disease. 2. Mild cardiomegaly. 3. Aortic atherosclerosis. Electronically Signed   By: Vinnie Langton M.D.   On: 08/31/2018 10:05    ECG & Cardiac Imaging    A. fib Assessment & Plan    78 year old woman with a past medical history of A. fib and mitral valve disease.  Found to have mitral valve stenosis which is at least moderate.  She is now symptomatic with recurrent episodes of dizziness, falls, shortness of breath.  And has A. Fib, chads vas score of 3.  She recently underwent right a catheterization left heart catheterization.   The transmitral valve gradient was found to be 9-10.  Found 40% lesion to vessels.  She was planned for surgical outpatient work-up.  Recommendations -Stop her apixaban and start on heparin tomorrow.  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  We will plan to involve surgery for discussion of plan for her valve.  Signed, Cristina Gong, MD 08/31/2018, 9:59 PM  For questions or updates, please contact   Please consult [email or web address] for  contact info under Cardiology/STEMI.

## 2018-08-31 NOTE — ED Notes (Signed)
Pt given meal tray upon request 

## 2018-08-31 NOTE — ED Provider Notes (Signed)
[facility] EMERGENCY DEPARTMENT Provider Note   CSN: [id number] Arrival date & time: 08/31/18  1443    History   Chief Complaint Chief Complaint  Patient presents with  . Tachycardia    HPI Hannah Roy is a 78 y.o. female.     Patient presenting with a complaint of feeling of palpitation heart racing this morning.  Associated with some lightheadedness some slight dizziness no room spinning.  Mild shortness of breath and substernal chest tightness.  Patient has a known history of atrial fibrillation.  Patient followed by cardiology.  Patient is on Eliquis patient is on diltiazem XT and also on Lopressor, for known atrial fibrillation.  Patient status post on May 21 a transesophageal echocardiogram and heart catheterization.  Heart catheterization done by Dr. Alvester Chou.  No significant coronary artery disease.  The most extensive was 40%.  Echocardiogram showed some multi valve problems.  Cardiology has referred patient to cardiothoracic surgery for consideration of heart valve repair.  Upon arrival here patient just had the chest tightness and other symptoms had resolved.  It appears patient was not aware of the findings from the cardiac cath and the echocardiogram.  Patient without a history of fevers.  No upper respiratory symptoms.  No known COVID-19 exposure.     Past Medical History:  Diagnosis Date  . Aortic atherosclerosis ([place]) 05/07/2018  . Atrial fibrillation ([place]) 05/15/2018  . Colon polyps   . History of seasonal allergies   . Hypertension   . Osteopenia 2006    Patient Active Problem List   Diagnosis Date Noted  . Rheumatic heart disease   . Atrial fibrillation ([place]) 05/15/2018  . Atrial fibrillation with RVR ([place]) 05/10/2018  . Hypothyroidism 05/10/2018  . Aortic atherosclerosis ([place]) 05/07/2018  . History of colonic polyps 08/03/2016  . Osteoarthritis of right knee 10/17/2013  . Essential hypertension, benign 07/30/2012  . Allergic rhinitis 07/30/2012  .  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 Osteopenia 07/30/2012    Past Surgical History:  Procedure Laterality Date  . APPENDECTOMY    . COLONOSCOPY  03/23/2011   repeat in 5 years,Procedure: COLONOSCOPY;  Surgeon: Rogene Houston, MD;  [facility];  Service: Endoscopy;  Laterality: N/A;  1:00  . COLONOSCOPY N/A 11/09/2016   Procedure: COLONOSCOPY;  Surgeon: Rogene Houston, MD;  [facility];  Service: Endoscopy;  Laterality: N/A;  1030  . RIGHT/LEFT HEART CATH AND CORONARY ANGIOGRAPHY N/A 08/29/2018   Procedure: RIGHT/LEFT HEART CATH AND CORONARY ANGIOGRAPHY;  Surgeon: Lorretta Harp, MD;  Location: [place] [facility];  Service: Cardiovascular;  Laterality: N/A;  . TEE WITHOUT CARDIOVERSION N/A 08/29/2018   Procedure: TRANSESOPHAGEAL ECHOCARDIOGRAM (TEE);  Surgeon: Skeet Latch, MD;  Location: [facility];  Service: Cardiovascular;  Laterality: N/A;  . TUBAL LIGATION       OB History   No obstetric history on file.      Home Medications    Prior to Admission medications   Medication Sig Start Date End Date Taking? Authorizing Provider  apixaban (ELIQUIS) 5 MG TABS tablet Take 1 tablet (5 mg total) by mouth 2 (two) times daily for 30 days. Patient taking differently: Take 5 mg by mouth 2 (two) times daily. (0730 & 1930) 05/11/18 08/26/19 Yes Shah, Pratik D, DO  chlorpheniramine (CHLOR-TRIMETON) 4 MG tablet Take 4 mg by mouth daily as needed for allergies.   Yes [provider]  diltiazem (CARTIA XT) 240 MG 24 hr capsule Take 1 capsule (240 mg  total) by mouth daily. 05/11/18 05/11/19 Yes Shah, Pratik D, DO  losartan (COZAAR) 25 MG tablet Take 1 tablet (25 mg total) by mouth daily. 05/15/18  Yes Kathyrn Drown, MD  metoprolol tartrate (LOPRESSOR) 25 MG tablet Take 1 tablet (25 mg total) by mouth 2 (two) times daily. 08/20/18 11/18/18 Yes Sherran Needs, NP  valACYclovir (VALTREX) 1000 MG tablet Take 1,000 mg by mouth daily as needed (fever blisters.).      [provider]    Family History Family History  Problem Relation Age of Onset  . Colon cancer Daughter     Social History Social History   Tobacco Use  . Smoking status: Former Smoker    Packs/day: 0.50    Years: 10.00    Pack years: 5.00  . Smokeless tobacco: Never Used  Substance Use Topics  . Alcohol use: No  . Drug use: No     Allergies   Bee venom; Boniva [ibandronic acid]; Lisinopril; and Latex   Review of Systems Review of Systems  Constitutional: Negative for chills and fever.  HENT: Negative for congestion, rhinorrhea and sore throat.   Eyes: Negative for redness and visual disturbance.  Respiratory: Positive for shortness of breath. Negative for cough.   Cardiovascular: Positive for chest pain and palpitations. Negative for leg swelling.  Gastrointestinal: Negative for abdominal pain, diarrhea, nausea and vomiting.  Genitourinary: Negative for dysuria.  Musculoskeletal: Negative for back pain, myalgias and neck pain.  Skin: Negative for rash.  Neurological: Positive for dizziness and light-headedness. Negative for syncope, weakness and headaches.  Hematological: Bruises/bleeds easily.  Psychiatric/Behavioral: Negative for confusion.     Physical Exam Updated Vital Signs BP 115/70   Pulse 82   Temp 98.1 F (36.7 C) (Oral)   Resp 18   Ht 1.549 m (5\' 1" )   Wt 54.4 kg   SpO2 95%   BMI 22.67 kg/m   Physical Exam Vitals signs and nursing note reviewed.  Constitutional:      General: She is not in acute distress.    Appearance: Normal appearance. She is well-developed.  HENT:     Head: Normocephalic and atraumatic.  Eyes:     Conjunctiva/sclera: Conjunctivae normal.     Pupils: Pupils are equal, round, and reactive to light.  Neck:     Musculoskeletal: Normal range of motion and neck supple.  Cardiovascular:     Rate and Rhythm: Tachycardia present. Rhythm irregular.     Heart sounds: No murmur.  Pulmonary:     Effort: Pulmonary  effort is normal. No respiratory distress.     Breath sounds: Normal breath sounds.  Abdominal:     General: Bowel sounds are normal.     Palpations: Abdomen is soft.     Tenderness: There is no abdominal tenderness.  Musculoskeletal: Normal range of motion.        General: No swelling.  Skin:    General: Skin is warm and dry.     Capillary Refill: Capillary refill takes less than 2 seconds.  Neurological:     General: No focal deficit present.     Mental Status: She is alert and oriented to person, place, and time.     Cranial Nerves: No cranial nerve deficit.     Motor: No weakness.      ED Treatments / Results  Labs (all labs ordered are listed, but only abnormal results are displayed) Labs Reviewed  BASIC METABOLIC PANEL - Abnormal; Notable for the following components:  Result Value   CO2 20 (*)    Glucose, Bld 110 (*)    All other components within normal limits  CBC  TROPONIN I    EKG EKG Interpretation  Date/Time:  Saturday Aug 31 2018 08:59:44 EDT Ventricular Rate:  112 PR Interval:    QRS Duration: 84 QT Interval:  368 QTC Calculation: 494 R Axis:   18 Text Interpretation:  Atrial fibrillation Left ventricular hypertrophy Anterior Q waves, possibly due to LVH Repol abnrm suggests ischemia, diffuse leads HEART RATE INCREASED SINCE last EKG  Otherwise no significant change Confirmed by Fredia Sorrow [phone]) on 08/31/2018 9:17:24 AM   Radiology Dg Chest Port 1 View  Result Date: 08/31/2018 CLINICAL DATA:  78 year old female with history of chest pain and shortness of breath. Dizziness. EXAM: PORTABLE CHEST 1 VIEW COMPARISON:  Chest x-ray 05/10/2018. FINDINGS: Lung volumes are normal. No consolidative airspace disease. No pleural effusions. No suspicious appearing pulmonary nodules or masses. No pneumothorax. No evidence of pulmonary edema. Mild cardiomegaly. Upper mediastinal contours are within normal limits. Aortic atherosclerosis. IMPRESSION: 1. No  radiographic evidence of acute cardiopulmonary disease. 2. Mild cardiomegaly. 3. Aortic atherosclerosis. Electronically Signed   By: Vinnie Langton M.D.   On: 08/31/2018 10:05    Procedures Procedures (including critical care time)  Medications Ordered in ED Medications  0.9 %  sodium chloride infusion ( Intravenous New Bag/Given 08/31/18 1127)  sodium chloride 0.9 % bolus 250 mL (0 mLs Intravenous Stopped 08/31/18 1100)  diltiazem (CARDIZEM) injection 10 mg (5 mg Intravenous Given 08/31/18 1304)     Initial Impression / Assessment and Plan / ED Course  I have reviewed the triage vital signs and the nursing notes.  Pertinent labs & imaging results that were available during my care of the patient were reviewed by me and considered in my medical decision making (see chart for details).       Patient placed on cardiac monitor.  Consistent with atrial fibrillation.  Sometimes heart rate would be up in the 130s.  Sometimes it would be in the 90s.  Patient did take her morning medications which includes diltiazem and Lopressor.  Patient also took her Eliquis.  Patient given 10 mg of diltiazem IV.  This brought her heart rate down into the 70s and 80s.  Still in atrial fibrillation.  Blood pressures were fine with systolics of 010.  We will have patient ambulate if asymptomatic patient probably can go home.  With precautions of returning for heart rate going fast for 40 minutes or longer.  Patient's troponin was negative basic labs without any significant abnormality.  Chest x-ray without acute findings.  As stated patient had a cardiac catheterization done May 21.  No significant coronary disease.  Currently patient nontoxic no acute distress.   Final Clinical Impressions(s) / ED Diagnoses   Final diagnoses:  Atrial fibrillation with RVR (HCC)  Precordial pain    ED Discharge Orders    None      Addendum:  Patient ambulated.  Patient had to stop frequently due to being short of  breath.  Oxygen saturations went from 97% down to 90%.  Patient once again fine at rest.  No obvious ataxia.  But patient did state that she felt dizzy.  May be a component of anxiety.  Will give 0.5 mg of Ativan.  But will also rule out pulmonary embolus.  Have ordered CT angios chest.  We will go and do CT head.  MRI not available here today.  Will check BNP and repeat troponin although cardiac catheterization should make acute cardiac event almost nil.    Once these test are back.  Would re-ambulate patient if still struggling.  Would contact cardiology may be that your heart valve disease is playing a role.     Fredia Sorrow, MD 08/31/18 1434

## 2018-08-31 NOTE — ED Notes (Signed)
Report given to Jasa with [facility]

## 2018-08-31 NOTE — ED Notes (Signed)
EDP aware of HR, orthostatic vs,  and rhythm

## 2018-08-31 NOTE — ED Notes (Signed)
Per Dr. Rogene Houston, who is at bedside, only give 5mg  diltiazem due to HR of 96 at this time.

## 2018-08-31 NOTE — ED Provider Notes (Signed)
Pt received at sign out with CT-A pending. See previous EDP note for full HPI/H&P/MDM. Pt's HR and BP variable while just sitting on stretcher: HR 70-110's, SBP 90-110's. Pt orthostatic on VS, with HR increasing into 130's per ED RN, with pt becoming symptomatic. Pt not ambulated d/t abnormal orthostatic VS. Judicious IVF given for orthostasis. BNP elevated, but CXR without overt pulmonary edema, and recent echo with normal LV function.[name] CTA without PE.  1735:  T/C returned from [facility] Cards Dr. Raiford Simmonds, case discussed, including:  HPI, pertinent PM/SHx, VS/PE, dx testing, ED course and treatment:  Agrees pt will likely need gentle IVF and possible meds adjustments, Cards service can see in consult after [facility] transfers to [facility]. 1755:  T/C returned from [facility] Dr. Nehemiah Settle, case discussed, including, d/w Cards MD:  HPI, pertinent PM/SHx, VS/PE, dx testing, ED course and treatment:  Agreeable to facilitate transfer to [facility] for admit.     17:18:04 Orthostatic Vital Signs LC  Orthostatic Lying   BP- Lying: 105/80  Pulse- Lying: 104      Orthostatic Sitting  BP- Sitting: 101/70  Pulse- Sitting: 100      Orthostatic Standing at 0 minutes  BP- Standing at 0 minutes: 72/54Abnormal   Pulse- Standing at 0 minutes: 120    MDM Reviewed: previous chart, nursing note and vitals Reviewed previous: labs and ECG Interpretation: labs, ECG, x-ray and CT scan Total time providing critical care: 30-74 minutes. This excludes time spent performing separately reportable procedures and services. Consults: cardiology and admitting MD    CRITICAL CARE Performed by: Francine Graven Total critical care time: 35 minutes Critical care time was exclusive of separately billable procedures and treating other patients. Critical care was necessary to treat or prevent imminent or life-threatening deterioration. Critical care was time spent personally by me on the following activities: development of treatment plan with patient  and/or surrogate as well as nursing, discussions with consultants, evaluation of patient's response to treatment, examination of patient, obtaining history from patient or surrogate, ordering and performing treatments and interventions, ordering and review of laboratory studies, ordering and review of radiographic studies, pulse oximetry and re-evaluation of patient's condition.    Francine Graven, DO 08/31/18 [phone]

## 2018-08-31 NOTE — ED Notes (Signed)
Pt assisted to [facility]

## 2018-08-31 NOTE — ED Notes (Signed)
Report given to [employer] on 6E

## 2018-09-01 ENCOUNTER — Encounter (HOSPITAL_COMMUNITY): Payer: Self-pay | Admitting: Thoracic Surgery (Cardiothoracic Vascular Surgery)

## 2018-09-01 DIAGNOSIS — R42 Dizziness and giddiness: Secondary | ICD-10-CM | POA: Diagnosis not present

## 2018-09-01 DIAGNOSIS — I050 Rheumatic mitral stenosis: Secondary | ICD-10-CM

## 2018-09-01 DIAGNOSIS — I272 Pulmonary hypertension, unspecified: Secondary | ICD-10-CM | POA: Diagnosis not present

## 2018-09-01 DIAGNOSIS — I342 Nonrheumatic mitral (valve) stenosis: Secondary | ICD-10-CM | POA: Diagnosis not present

## 2018-09-01 DIAGNOSIS — D62 Acute posthemorrhagic anemia: Secondary | ICD-10-CM | POA: Diagnosis not present

## 2018-09-01 DIAGNOSIS — J9811 Atelectasis: Secondary | ICD-10-CM | POA: Diagnosis not present

## 2018-09-01 DIAGNOSIS — I959 Hypotension, unspecified: Secondary | ICD-10-CM | POA: Diagnosis present

## 2018-09-01 DIAGNOSIS — I350 Nonrheumatic aortic (valve) stenosis: Secondary | ICD-10-CM | POA: Diagnosis not present

## 2018-09-01 DIAGNOSIS — I4892 Unspecified atrial flutter: Secondary | ICD-10-CM | POA: Diagnosis not present

## 2018-09-01 DIAGNOSIS — I700 Atherosclerosis of aorta: Secondary | ICD-10-CM | POA: Diagnosis present

## 2018-09-01 DIAGNOSIS — I951 Orthostatic hypotension: Secondary | ICD-10-CM | POA: Diagnosis not present

## 2018-09-01 DIAGNOSIS — I083 Combined rheumatic disorders of mitral, aortic and tricuspid valves: Secondary | ICD-10-CM | POA: Diagnosis not present

## 2018-09-01 DIAGNOSIS — I099 Rheumatic heart disease, unspecified: Secondary | ICD-10-CM | POA: Diagnosis not present

## 2018-09-01 DIAGNOSIS — D6959 Other secondary thrombocytopenia: Secondary | ICD-10-CM | POA: Diagnosis not present

## 2018-09-01 DIAGNOSIS — I440 Atrioventricular block, first degree: Secondary | ICD-10-CM | POA: Diagnosis not present

## 2018-09-01 DIAGNOSIS — Z87891 Personal history of nicotine dependence: Secondary | ICD-10-CM | POA: Diagnosis not present

## 2018-09-01 DIAGNOSIS — I351 Nonrheumatic aortic (valve) insufficiency: Secondary | ICD-10-CM | POA: Diagnosis present

## 2018-09-01 DIAGNOSIS — I071 Rheumatic tricuspid insufficiency: Secondary | ICD-10-CM | POA: Diagnosis present

## 2018-09-01 DIAGNOSIS — Z1159 Encounter for screening for other viral diseases: Secondary | ICD-10-CM | POA: Diagnosis not present

## 2018-09-01 DIAGNOSIS — I4891 Unspecified atrial fibrillation: Secondary | ICD-10-CM | POA: Diagnosis not present

## 2018-09-01 DIAGNOSIS — I052 Rheumatic mitral stenosis with insufficiency: Secondary | ICD-10-CM | POA: Diagnosis present

## 2018-09-01 DIAGNOSIS — R1310 Dysphagia, unspecified: Secondary | ICD-10-CM | POA: Diagnosis present

## 2018-09-01 DIAGNOSIS — G4700 Insomnia, unspecified: Secondary | ICD-10-CM | POA: Diagnosis not present

## 2018-09-01 DIAGNOSIS — F410 Panic disorder [episodic paroxysmal anxiety] without agoraphobia: Secondary | ICD-10-CM | POA: Diagnosis present

## 2018-09-01 DIAGNOSIS — I5033 Acute on chronic diastolic (congestive) heart failure: Secondary | ICD-10-CM | POA: Diagnosis not present

## 2018-09-01 DIAGNOSIS — I361 Nonrheumatic tricuspid (valve) insufficiency: Secondary | ICD-10-CM | POA: Diagnosis not present

## 2018-09-01 DIAGNOSIS — I2510 Atherosclerotic heart disease of native coronary artery without angina pectoris: Secondary | ICD-10-CM | POA: Diagnosis present

## 2018-09-01 DIAGNOSIS — I712 Thoracic aortic aneurysm, without rupture: Secondary | ICD-10-CM | POA: Diagnosis not present

## 2018-09-01 DIAGNOSIS — R001 Bradycardia, unspecified: Secondary | ICD-10-CM | POA: Diagnosis not present

## 2018-09-01 DIAGNOSIS — Z01810 Encounter for preprocedural cardiovascular examination: Secondary | ICD-10-CM | POA: Diagnosis not present

## 2018-09-01 DIAGNOSIS — I349 Nonrheumatic mitral valve disorder, unspecified: Secondary | ICD-10-CM | POA: Diagnosis not present

## 2018-09-01 DIAGNOSIS — Z4682 Encounter for fitting and adjustment of non-vascular catheter: Secondary | ICD-10-CM | POA: Diagnosis not present

## 2018-09-01 DIAGNOSIS — I061 Rheumatic aortic insufficiency: Secondary | ICD-10-CM | POA: Diagnosis not present

## 2018-09-01 DIAGNOSIS — I442 Atrioventricular block, complete: Secondary | ICD-10-CM | POA: Diagnosis not present

## 2018-09-01 DIAGNOSIS — I77810 Thoracic aortic ectasia: Secondary | ICD-10-CM | POA: Diagnosis present

## 2018-09-01 DIAGNOSIS — I340 Nonrheumatic mitral (valve) insufficiency: Secondary | ICD-10-CM | POA: Diagnosis not present

## 2018-09-01 DIAGNOSIS — Z7901 Long term (current) use of anticoagulants: Secondary | ICD-10-CM | POA: Diagnosis not present

## 2018-09-01 DIAGNOSIS — I080 Rheumatic disorders of both mitral and aortic valves: Secondary | ICD-10-CM | POA: Diagnosis not present

## 2018-09-01 DIAGNOSIS — M810 Age-related osteoporosis without current pathological fracture: Secondary | ICD-10-CM | POA: Diagnosis present

## 2018-09-01 DIAGNOSIS — I110 Hypertensive heart disease with heart failure: Secondary | ICD-10-CM | POA: Diagnosis not present

## 2018-09-01 DIAGNOSIS — I341 Nonrheumatic mitral (valve) prolapse: Secondary | ICD-10-CM | POA: Diagnosis not present

## 2018-09-01 DIAGNOSIS — I4819 Other persistent atrial fibrillation: Secondary | ICD-10-CM | POA: Diagnosis not present

## 2018-09-01 DIAGNOSIS — Z953 Presence of xenogenic heart valve: Secondary | ICD-10-CM | POA: Diagnosis not present

## 2018-09-01 DIAGNOSIS — E876 Hypokalemia: Secondary | ICD-10-CM | POA: Diagnosis not present

## 2018-09-01 DIAGNOSIS — E039 Hypothyroidism, unspecified: Secondary | ICD-10-CM | POA: Diagnosis not present

## 2018-09-01 DIAGNOSIS — R072 Precordial pain: Secondary | ICD-10-CM | POA: Diagnosis not present

## 2018-09-01 DIAGNOSIS — I069 Rheumatic aortic valve disease, unspecified: Secondary | ICD-10-CM | POA: Diagnosis not present

## 2018-09-01 DIAGNOSIS — I10 Essential (primary) hypertension: Secondary | ICD-10-CM | POA: Diagnosis not present

## 2018-09-01 DIAGNOSIS — Z954 Presence of other heart-valve replacement: Secondary | ICD-10-CM | POA: Diagnosis not present

## 2018-09-01 LAB — MAGNESIUM: Magnesium: 2 mg/dL (ref 1.7–2.4)

## 2018-09-01 LAB — CBC
HCT: 38.5 % (ref 36.0–46.0)
Hemoglobin: 13 g/dL (ref 12.0–15.0)
MCH: 31.1 pg (ref 26.0–34.0)
MCHC: 33.8 g/dL (ref 30.0–36.0)
MCV: 92.1 fL (ref 80.0–100.0)
Platelets: 225 10*3/uL (ref 150–400)
RBC: 4.18 MIL/uL (ref 3.87–5.11)
RDW: 12.1 % (ref 11.5–15.5)
WBC: 4.9 10*3/uL (ref 4.0–10.5)
nRBC: 0 % (ref 0.0–0.2)

## 2018-09-01 LAB — BASIC METABOLIC PANEL
Anion gap: 12 (ref 5–15)
BUN: 11 mg/dL (ref 8–23)
CO2: 22 mmol/L (ref 22–32)
Calcium: 8.8 mg/dL — ABNORMAL LOW (ref 8.9–10.3)
Chloride: 108 mmol/L (ref 98–111)
Creatinine, Ser: 0.75 mg/dL (ref 0.44–1.00)
GFR calc Af Amer: >60 mL/min (ref >60)
GFR calc non Af Amer: >60 mL/min (ref >60)
Glucose, Bld: 100 mg/dL — ABNORMAL HIGH (ref 70–99)
Potassium: 3.3 mmol/L — ABNORMAL LOW (ref 3.5–5.1)
Sodium: 142 mmol/L (ref 135–145)

## 2018-09-01 LAB — HEPARIN LEVEL (UNFRACTIONATED): Heparin Unfractionated: 1.58 IU/mL — ABNORMAL HIGH (ref 0.30–0.70)

## 2018-09-01 LAB — APTT: aPTT: 68 seconds — ABNORMAL HIGH (ref 24–36)

## 2018-09-01 MED ORDER — POTASSIUM CHLORIDE CRYS ER 20 MEQ PO TBCR
40.0000 meq | EXTENDED_RELEASE_TABLET | Freq: Once | ORAL | Status: AC
  Administered 2018-09-01: 40 meq via ORAL
  Filled 2018-09-01: qty 2

## 2018-09-01 MED ORDER — SODIUM CHLORIDE 0.9 % IV SOLN
INTRAVENOUS | Status: DC
  Administered 2018-09-01: 17:00:00 via INTRAVENOUS

## 2018-09-01 MED ORDER — HEPARIN (PORCINE) 25000 UT/250ML-% IV SOLN
750.0000 [IU]/h | INTRAVENOUS | Status: AC
  Administered 2018-09-01 – 2018-09-02 (×2): 800 [IU]/h via INTRAVENOUS
  Administered 2018-09-03: 750 [IU]/h via INTRAVENOUS
  Filled 2018-09-01 (×3): qty 250

## 2018-09-01 MED ORDER — AMIODARONE HCL 200 MG PO TABS
200.0000 mg | ORAL_TABLET | Freq: Two times a day (BID) | ORAL | Status: DC
  Administered 2018-09-01 – 2018-09-04 (×7): 200 mg via ORAL
  Filled 2018-09-01 (×7): qty 1

## 2018-09-01 NOTE — Consult Note (Addendum)
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  Patient has been healthy all of her adult life and physically active.  Past medical history is notable only for history of hypertension.  She denies any known history of rheumatic fever during childhood or previous history of heart murmur.  In January of this year she presented with tachypalpitations and shortness of breath.  She was seen by her primary care physician and was diagnosed with atrial fibrillation.  She was anticoagulated using Eliquis and started on diltiazem for rate control.  An echocardiogram was performed demonstrating normal left ventricular systolic function with ejection fraction estimated 60 to 65%.  There was felt to be mild to moderate mitral regurgitation, moderate to severe aortic regurgitation, and mild aortic stenosis.  There was severe left and moderate right atrial enlargement.  She was seen by Dr. Bronson Roy who has been following her carefully ever since.  She has spontaneously converted back to sinus rhythm at the time of his initial office consultation on May 17, 2018.  On Aug 09, 2018 the patient was seen in the emergency department at [facility] with recurrent atrial fibrillation and rapid ventricular response.  She underwent DC cardioversion and was discharged home.  Since  then symptoms have continued to progress with worsening exertional shortness of breath, chest tightness, and palpitations.  She underwent TEE and cardiac catheterization on Aug 29, 2018.  TEE revealed normal left ventricular systolic function with rheumatic aortic and mitral valve disease.  There was moderate mitral stenosis and moderate mitral regurgitation.  There was at least moderate aortic insufficiency and mild aortic stenosis.  There was moderate tricuspid regurgitation.  Catheterization revealed mild nonobstructive coronary artery disease and hemodynamic findings consistent with mitral stenosis and mitral regurgitation.  There was mild pulmonary hypertension.  Mean pulmonary capillary wedge pressure was 17 with V wave 26 mmHg.  Central venous pressure was 10 and cardiac output normal.  Cardiothoracic surgical consultation was requested and the patient was scheduled to see me in the office next week.  She presented to the emergency department at [facility] yesterday with worsening dizziness and shortness of breath.  She was again found to be in atrial fibrillation.  She was transferred to [facility] for further management.  CT angiogram of the chest revealed no evidence for pulmonary embolism.  There was mild fusiform dilatation of the ascending aorta.  COVID testing was negative.  Patient is married and lives with her husband in [place].  She is retired having previously worked at [employer].  The patient's husband has had many chronic medical problems.  The patient has been remarkably active and functionally independent until recently.  She drives an automobile and tends to all of the needs of her entire family.  She has a daughter and several grandchildren.  She describes a several month history of exertional shortness of breath, palpitations, and vague tightness across her chest.  She has had intermittent dizzy spells without syncope.  She denies any PND, orthopnea, or  lower extremity edema.  She has not had fevers or productive cough.  Appetite is normal.   Past Medical History:  Diagnosis Date   Aortic atherosclerosis ([place]) 05/07/2018   Atrial fibrillation ([employer] [employer]) 05/15/2018   Colon polyps    History of seasonal allergies    Hypertension    Osteopenia 2006    Past Surgical History:  Procedure Laterality Date   APPENDECTOMY     COLONOSCOPY  03/23/2011   repeat in 5 years,Procedure: COLONOSCOPY;  Surgeon: Rogene Houston, MD;  Location: [facility];  Service: Endoscopy;  Laterality: N/A;  1:00   COLONOSCOPY N/A 11/09/2016   Procedure: COLONOSCOPY;  Surgeon: Rogene Houston, MD;  Location: [facility];  Service: Endoscopy;  Laterality: N/A;  1030   RIGHT/LEFT HEART CATH AND CORONARY ANGIOGRAPHY N/A 08/29/2018   Procedure: RIGHT/LEFT HEART CATH AND CORONARY ANGIOGRAPHY;  Surgeon: Lorretta Harp, MD;  Location: [facility];  Service: Cardiovascular;  Laterality: N/A;   TEE WITHOUT CARDIOVERSION N/A 08/29/2018   Procedure: TRANSESOPHAGEAL ECHOCARDIOGRAM (TEE);  Surgeon: Skeet Latch, MD;  Location: [facility] ENDOSCOPY;  Service: Cardiovascular;  Laterality: N/A;   TUBAL LIGATION      Family History  Problem Relation Age of Onset   Colon cancer Daughter     Social History   Socioeconomic History   Marital status: Married    Spouse name: Not on file   Number of children: Not on file   Years of education: Not on file   Highest education level: Not on file  Occupational History   Not on file  Social Needs   Financial resource strain: Not on file   Food insecurity:    Worry: Not on file    Inability: Not on file   Transportation needs:    Medical: Not on file    Non-medical: Not on file  Tobacco Use   Smoking status: Former Smoker    Packs/day: 0.50    Years: 10.00    Pack years: 5.00   Smokeless tobacco: Never Used  Substance and Sexual Activity   Alcohol use: No   Drug use: No   Sexual activity: Not  on file  Lifestyle   Physical activity:    Days per week: Not on file    Minutes per session: Not on file   Stress: Not on file  Relationships   Social connections:    Talks on phone: Not on file    Gets together: Not on file    Attends religious service: Not on file    Active member of club or organization: Not on file    Attends meetings of clubs or organizations: Not on file    Relationship status: Not on file   Intimate partner violence:    Fear of current or ex partner: Not on file    Emotionally abused: Not on file    Physically abused: Not on file    Forced sexual activity: Not on file  Other Topics Concern   Not on file  Social History Narrative   Not on file    Prior to Admission medications   Medication Sig Start Date End Date Taking? Authorizing Provider  apixaban (ELIQUIS) 5 MG TABS tablet Take 1 tablet (5 mg total) by mouth 2 (two) times daily for  30 days. Patient taking differently: Take 5 mg by mouth 2 (two) times daily. (0730 & 1930) 05/11/18 08/26/19 Yes Shah, Pratik D, DO  chlorpheniramine (CHLOR-TRIMETON) 4 MG tablet Take 4 mg by mouth daily as needed for allergies.   Yes [provider]  diltiazem (CARTIA XT) 240 MG 24 hr capsule Take 1 capsule (240 mg total) by mouth daily. 05/11/18 05/11/19 Yes Shah, Pratik D, DO  losartan (COZAAR) 25 MG tablet Take 1 tablet (25 mg total) by mouth daily. 05/15/18  Yes Hannah Drown, MD  metoprolol tartrate (LOPRESSOR) 25 MG tablet Take 1 tablet (25 mg total) by mouth 2 (two) times daily. 08/20/18 11/18/18 Yes Sherran Needs, NP  valACYclovir (VALTREX) 1000 MG tablet Take 1,000 mg by mouth daily as needed (fever blisters.).     [provider]    Current Facility-Administered Medications  Medication Dose Route Frequency Provider Last Rate Last Dose   0.9 %  sodium chloride infusion   Intravenous Continuous Modena Jansky, MD 50 mL/hr at 09/01/18 1708     diltiazem (CARDIZEM CD) 24 hr capsule 240 mg   240 mg Oral Daily Truett Mainland, DO   240 mg at 09/01/18 1002   heparin ADULT infusion 100 units/mL (25000 units/295mL sodium chloride 0.45%)  800 Units/hr Intravenous Continuous Karren Cobble, RPH 8 mL/hr at 09/01/18 1016 800 Units/hr at 09/01/18 1016   losartan (COZAAR) tablet 25 mg  25 mg Oral Daily Truett Mainland, DO   25 mg at 09/01/18 1002   metoprolol tartrate (LOPRESSOR) tablet 25 mg  25 mg Oral BID Truett Mainland, DO   25 mg at 09/01/18 1001   ondansetron (ZOFRAN) tablet 4 mg  4 mg Oral Q6H PRN Truett Mainland, DO       Or   ondansetron [facility]) injection 4 mg  4 mg Intravenous Q6H PRN Stinson, Jacob J, DO       sodium chloride 0.9 % bolus 250 mL  250 mL Intravenous Once Stinson, Jacob J, DO        Allergies  Allergen Reactions   Bee Venom Anaphylaxis   Boniva [Ibandronic Acid]     Upset stomach, flu like symptoms    Lisinopril Cough   Latex Rash      Review of Systems:   General:  normal appetite, decreased energy, no weight gain, no weight loss, no fever  Cardiac:  no chest pain with exertion, vague chest pain at rest, +SOB with exertion, no resting SOB, no PND, no orthopnea, + palpitations, + arrhythmia, + atrial fibrillation, no LE edema, + dizzy spells, + syncope  Respiratory:  + shortness of breath, no home oxygen, no productive cough, no dry cough, no bronchitis, no wheezing, no hemoptysis, no asthma, no pain with inspiration or cough, no sleep apnea, no CPAP at night  GI:   no difficulty swallowing, no reflux, no frequent heartburn, no hiatal hernia, no abdominal pain, no constipation, no diarrhea, no hematochezia, no hematemesis, no melena  GU:   no dysuria,  no frequency, no urinary tract infection, no hematuria, no kidney stones, no kidney disease  Vascular:  no pain suggestive of claudication, no pain in feet, no leg cramps, no varicose veins, no DVT, no non-healing foot ulcer  Neuro:   no stroke, no TIA's, no seizures, no headaches, no  temporary blindness one eye,  no slurred speech, no peripheral neuropathy, no chronic pain, no instability of gait, no memory/cognitive dysfunction  Musculoskeletal: no arthritis ,  no joint swelling, no myalgias, no difficulty walking, normal mobility   Skin:   no rash, no itching, no skin infections, no pressure sores or ulcerations  Psych:   no anxiety, no depression, no nervousness, no unusual recent stress  Eyes:   no blurry vision, no floaters, no recent vision changes, does not wears glasses other than for reading  ENT:   no hearing loss, no loose or painful teeth, no dentures, last saw dentist within the past year  Hematologic:  no easy bruising, no abnormal bleeding, no clotting disorder, no frequent epistaxis  Endocrine:  no diabetes, does not check CBG's at home     Physical Exam:   BP 92/69 (BP Location: Right Arm)    Pulse 79    Temp 98.1 F (36.7 C) (Oral)    Resp (!) 23    Ht 5\' 1"  (1.549 m)    Wt 55.5 kg    SpO2 99%    BMI 23.11 kg/m   General:    well-appearing  HEENT:  Unremarkable   Neck:   no JVD, no bruits, no adenopathy   Chest:   clear to auscultation, symmetrical breath sounds, no wheezes, no rhonchi   CV:   Irregular rate and rhythm, grade II/VI systolic murmur   Abdomen:  soft, non-tender, no masses   Extremities:  warm, well-perfused, pulses diminished, no lower extremity edema  Rectal/GU  Deferred  Neuro:   Grossly non-focal and symmetrical throughout  Skin:   Clean and dry, no rashes, no breakdown  Diagnostic Tests:  Lab Results: Recent Labs    08/31/18 0915 09/01/18 0541  WBC 5.6 4.9  HGB 13.8 13.0  HCT 40.4 38.5  PLT 239 225   BMET:  Recent Labs    08/31/18 0915 09/01/18 0541  NA 141 142  K 3.7 3.3*  CL 109 108  CO2 20* 22  GLUCOSE 110* 100*  BUN 15 11  CREATININE 0.71 0.75  CALCIUM 9.0 8.8*    CBG (last 3)  No results for input(s): GLUCAP in the last 72 hours. PT/INR:  No results for input(s): LABPROT, INR in the last 72  hours.  CXR:  PORTABLE CHEST 1 VIEW  COMPARISON:  Chest x-ray 05/10/2018.  FINDINGS: Lung volumes are normal. No consolidative airspace disease. No pleural effusions. No suspicious appearing pulmonary nodules or masses. No pneumothorax. No evidence of pulmonary edema. Mild cardiomegaly. Upper mediastinal contours are within normal limits. Aortic atherosclerosis.  IMPRESSION: 1. No radiographic evidence of acute cardiopulmonary disease. 2. Mild cardiomegaly. 3. Aortic atherosclerosis.   Electronically Signed   By: Vinnie Langton M.D.   On: 08/31/2018 10:05   TRANSTHORACIC ECHOCARDIOGRAM REPORT     Patient Name:   DEREKA LUERAS Date of Exam: 05/10/2018 Medical Rec #:  [id number]   Height:       61.0 in Accession #:    [id number]  Weight:       131.0 lb Date of Birth:  July 27, 1940   BSA:          1.58 m Patient Age:    83 years    BP:           128/87 mmHg Patient Gender: F           HR:           130 bpm. Exam Location:  [facility]    Procedure: 2D Echo  Indications:     I51.7 (ICD-10-CM) - Cardiomegaly, R06.09 (ICD-10-CM) - DOE                  (  dyspnea on exertion), I70.0 (ICD-10-CM) - Aortic                  atherosclerosis   History:         Patient has no prior history of Echocardiogram examinations.                  Risk Factors: Hypertension.   Sonographer:     Alvino Chapel RCS Referring Phys:  [phone] Hannah Roy Diagnosing Phys: Hannah Sable MD  IMPRESSIONS    1. The left ventricle has normal systolic function of 69-48%. The cavity size is normal. There is moderate focal basal septal left ventricular wall thickness. Echo evidence of unable to assess due to arrhythmia (atrial fibrillation and/or flutter)  diastolic filling patterns.  2. Severely dilated left atrial size.  3. Moderately dilated right atrial size.  4. The mitral valve is myxomatous. There is mild thickening. Regurgitation is mild to moderate by color flow Doppler.  5.  Normal tricuspid valve.  6. Tricuspid regurgitation moderate.  7. The aortic valve tricuspid. There is moderate thickening of the aortic valve. Aortic valve regurgitation is moderate to severe by color flow Doppler. The calculated aortic valve area is 0.99 cm consistent with mild stenosis.  8. Mild dilatation of the aortic root.  9. The inferior vena cava was dilated in size with >50% respiratory variablity. 10. The patient was tachycardic throughout the study with evidence of atrial fibrillation and/or flutter. 11. No atrial level shunt detected by color flow Doppler.  FINDINGS  Left Ventricle: No evidence of left ventricular regional wall motion abnormalities. The left ventricle has normal systolic function of 54-62%. The cavity size is normal. There is moderate focal basal septal left ventricular wall thickness. Echo evidence  of unable to assess due to arrhythmia (atrial fibrillation and/or flutter) diastolic filling patterns. Right Ventricle: The right ventricle is normal in size. There is normal hypertrophy. There is normal systolic function. Left Atrium: The left atrium is severely dilated. Right Atrium: The right atrial size is moderately dilated. Interatrial Septum: No atrial level shunt detected by color flow Doppler.  Pericardium: There is no evidence of pericardial effusion. Mitral Valve: The mitral valve is myxomatous. There is mild thickening. Regurgitation is mild to moderate by color flow Doppler. Tricuspid Valve: The tricuspid valve is normal in structure. Tricuspid regurgitation moderate by color flow Doppler. Aortic Valve: The aortic valve tricuspid. There is moderate thickening of the aortic valve. Aortic valve regurgitation is moderate to severe by color flow Doppler. The calculated aortic valve area is 0.99 cm consistent with mild stenosis. Pulmonic Valve: The pulmonic valve is grossly normal. Pulmonic valve regurgitation is trivial by color flow Doppler. Aorta: There  is mild dilatation of the aortic root. Venous: The inferior vena cava was dilated in size with greater than 50% respiratory variablity. In comparison to the previous echocardiogram(s): There are no prior studies on this patient for comparison purposes. The patient was tachycardic throughout the study with evidence of atrial fibrillation and/or flutter.   LEFT VENTRICLE PLAX 2D ([place])              Biplane EF (MOD) LV EF:          55.7 %       LV Biplane EF:   59.3 % LVIDd:          4.05 cm      LV A4C EF:       43.4 % LVIDs:  2.89 cm      LV A2C EF:       69.4 % LV PW:          0.90 cm LV IVS:         1.29 cm LVOT diam:      1.60 cm LV SV:          40 ml LVOT Area:      2.01 cm   LV Volumes (MOD) LV area d, A2C:    25.40 cm LV area d, A4C:    23.70 cm LV area s, A2C:    12.30 cm LV area s, A4C:    16.20 cm LV major d, A2C:   7.14 cm LV major d, A4C:   6.83 cm LV major s, A2C:   5.79 cm LV major s, A4C:   5.73 cm LV vol d, MOD A2C: 76.1 ml LV vol d, MOD A4C: 67.8 ml LV vol s, MOD A2C: 23.3 ml LV vol s, MOD A4C: 38.4 ml LV SV MOD A2C:     52.8 ml LV SV MOD A4C:     67.8 ml LV SV MOD BP:      43.5 ml  RIGHT VENTRICLE TAPSE (M-mode): 1.4 cm RVSP:           33.6 mmHg  LEFT ATRIUM              Index       RIGHT ATRIUM           Index LA diam:        4.20 cm  2.66 cm/m  RA Pressure: 8 mmHg LA Vol (A2C):   109.0 ml 69.08 ml/m RA Area:     27.70 cm LA Vol (A4C):   114.0 ml 72.25 ml/m RA Volume:   90.20 ml  57.16 ml/m LA Biplane Vol: 113.0 ml 71.61 ml/m  AORTIC VALVE AV Area (Vmax):    0.95 cm AV Area (Vmean):   0.90 cm AV Area (VTI):     0.99 cm AV Vmax:           207.00 cm/s AV Vmean:          145.000 cm/s AV VTI:            0.403 m AV Peak Grad:      17.1 mmHg AV Mean Grad:      10.0 mmHg LVOT Vmax:         97.90 cm/s LVOT Vmean:        64.600 cm/s LVOT VTI:          0.199 m LVOT/AV VTI ratio: 0.49 AR PHT:            356 msec   AORTA Ao Root  diam: 3.90 cm  MITRAL VALVE               TR Peak grad: 25.6 mmHg MV Area (PHT): 1.24 cm    TR Vmax:      271.00 cm/s MV Peak grad:  20.4 mmHg   RVSP:         33.6 mmHg MV Mean grad:  8.0 mmHg MV Vmax:       2.26 m/s MV Vmean:      128.0 cm/s MV VTI:        0.60 m MV PHT:        177.77 msec MV Decel Time: 613 msec MR Peak grad: 102.4 mmHg MR Mean grad: 71.0 mmHg MR Vmax:  506.00 cm/s MR Vmean:     395.5 cm/s MV E velocity: 179.00 cm/s    Hannah Sable MD Electronically signed by Hannah Sable MD Signature Date/Time: 05/10/2018/11:43:56 AM    TRANSESOPHOGEAL ECHO REPORT       Patient Name:   [name] Date of Exam: 08/29/2018 Medical Rec #:  [id number]   Height:       61.0 in Accession #:    [id number]  Weight:       123.0 lb Date of Birth:  06-08-1940   BSA:          1.54 m Patient Age:    56 years    BP:           115/41 mmHg Patient Gender: F           HR:           60 bpm. Exam Location:  Inpatient    Procedure: Transesophageal Echo, Cardiac Doppler, Color Doppler and 3D Echo  Indications:     Mitral Stenosis, Rheumatic   History:         Patient has prior history of Echocardiogram examinations, most                  recent 04/22/2018. Pulmonic Valve Disease. Hypertension, Atrial                  Fibrillation with Rapid Ventricular Rate, Mitral Stenosis.   Sonographer:     Madelaine Etienne RDCS (AE) Referring Phys:  [place] Diagnosing Phys: Skeet Latch MD     PROCEDURE: Normal Transesophogeal exam. After discussion of the risks and benefits of a TEE, an informed consent was obtained from the patient. Local oropharyngeal anesthetic was provided with viscous lidocaine. Patients was under conscious sedation  during this procedure. The transesophogeal probe was passed through the esophogus of the patient. Image quality was good. The patient's vital signs; including heart rate, blood pressure, and oxygen saturation; remained  stable throughout the procedure.  The patient developed no complications during the procedure.  IMPRESSIONS    1. The left ventricle has normal systolic function, with an ejection fraction of 55-60%. The cavity size was normal.  2. No evidence of a thrombus present in the left atrial appendage.  3. The mitral valve is rheumatic. Moderate thickening of the anterior mitral valve leaflet, with moderately decreased mobility. Moderate mitral valve stenosis.  4. Hockey stick-like motion of the anterior mitral valve leaflet consistent with Rheumatic mitral valve disease.  5. Tricuspid valve regurgitation is moderate.  6. The aortic valve is tricuspid Mild thickening of the aortic valve Mild calcification of the aortic valve. Aortic valve regurgitation is moderate by color flow Doppler. Mild stenosis of the aortic valve.  7. Aortic valve leaflet motion mildly restricted. Aortic valve leaflets do not fully coapt, resulting in central aortic regurgitation. Aortic regurgitaiton pressure half time 397 ms, consistent with moderate aortic regurgitation.  8. Pulmonic valve regurgitation is mild by color flow Doppler.  FINDINGS  Left Ventricle: The left ventricle has normal systolic function, with an ejection fraction of 55-60%. The cavity size was normal.  Left Atrium: Left atrial size was normal in size. LAA velocity 0.56 m/s.   Left Atrial Appendage: No evidence of a thrombus present in the left atrial appendage.  Interatrial Septum: No atrial level shunt detected by color flow Doppler.  Mitral Valve: The mitral valve is rheumatic. Moderate thickening of the anterior mitral valve leaflet, with moderately decreased  mobility. Mitral valve regurgitation is trivial by color flow Doppler. Moderate mitral valve stenosis. Hockey stick-like  motion of the anterior mitral valve leaflet consistent with Rheumatic mitral valve disease.  Tricuspid Valve: Tricuspid valve regurgitation is moderate by color  flow Doppler.  Aortic Valve: The aortic valve is tricuspid Mild thickening of the aortic valve. Mild calcification of the aortic valve. Aortic valve regurgitation is moderate by color flow Doppler. The vena contracta measures 0.56 cm. There is Mild stenosis of the  aortic valve, with a calculated valve area of 1.42 cm. Aortic valve leaflet motion mildly restricted. Aortic valve leaflets do not fully coapt, resulting in central aortic regurgitation. Aortic regurgitaiton pressure half time 397 ms, consistent with  moderate aortic regurgitation.  Pulmonic Valve: The pulmonic valve was normal in structure. Pulmonic valve regurgitation is mild by color flow Doppler. No evidence of pulmonic stenosis.    +--------------+--------++  LEFT VENTRICLE            +--------------+--------++  PLAX 2D                   +--------------+--------++  LVOT diam:     2.00 cm    +--------------+--------++  LVOT Area:     3.14 cm   +--------------+--------++                            +--------------+--------++  +------------------+------------++  AORTIC VALVE                      +------------------+------------++  AV Area (Vmax):    1.73 cm       +------------------+------------++  AV Area (Vmean):   1.62 cm       +------------------+------------++  AV Area (VTI):     1.42 cm       +------------------+------------++  AV Vmax:           248.50 cm/s    +------------------+------------++  AV Vmean:          187.000 cm/s   +------------------+------------++  AV VTI:            0.722 m        +------------------+------------++  AV Peak Grad:      24.7 mmHg      +------------------+------------++  AV Mean Grad:      15.0 mmHg      +------------------+------------++  LVOT Vmax:         137.00 cm/s    +------------------+------------++  LVOT Vmean:        96.500 cm/s    +------------------+------------++  LVOT VTI:          0.325 m        +------------------+------------++  LVOT/AV VTI  ratio: 0.45           +------------------+------------++  AR PHT:            517 msec       +------------------+------------++   +------------+-------++  AORTA                  +------------+-------++  Ao Asc diam: 3.67 cm   +------------+-------++  +--------------+-----------++ +---------------+-----------++  MITRAL VALVE                  TRICUSPID VALVE               +--------------+-----------++ +---------------+-----------++  MV Area (PHT): 1.26 cm       TR Peak grad:   32.9 mmHg     +--------------+-----------++ +---------------+-----------++  MV Peak grad:  10.9 mmHg      TR Vmax:        287.00 cm/s   +--------------+-----------++ +---------------+-----------++  MV Mean grad:  6.0 mmHg      +--------------+-----------++ +--------------+-------+  MV Vmax:       1.65 m/s       SHUNTS                  +--------------+-----------++ +--------------+-------+  MV Vmean:      117.0 cm/s     Systemic VTI:  0.32 m   +--------------+-----------++ +--------------+-------+  MV VTI:        0.62 m         Systemic Diam: 2.00 cm  +--------------+-----------++ +--------------+-------+  MV PHT:        175.00 [facility]   +--------------+-----------++    Skeet Latch MD Electronically signed by Skeet Latch MD Signature Date/Time: 08/29/2018/1:12:04 PM     RIGHT/LEFT HEART CATH AND CORONARY ANGIOGRAPHY  Conclusion     Prox LAD lesion is 40% stenosed.  Mid Cx lesion is 40% stenosed.  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   LOVELLE DEITRICK is a 77 y.o. female    [id number] LOCATION:  FACILITY: [facility]  PHYSICIAN: Quay Burow, M.D. 1940-04-16   DATE OF PROCEDURE:  08/29/2018  DATE OF DISCHARGE:     CARDIAC CATHETERIZATION     History obtained from chart review.  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  She did have A. fib in the past and was symptomatic.   She is currently in sinus rhythm and was on Eliquis which was discontinued 3 days ago.  She presents now for right and left heart cath to define her anatomy and physiology in anticipation for multivalve surgery by Dr. Roxy Manns.   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  The Swan-Ganz catheter was removed.  The radial sheath was removed and a TR band was placed on the right wrist to achieve patent hemostasis.  The patient left lab stable condition.  She can be discharged home today as an outpatient and restart Eliquis oral anticoagulation.  We will arrange outpatient follow-up and evaluation with Dr. Roxy Manns to determine suitability for multivalve replacement.  Quay Burow. MD, [facility] 08/29/2018 1:37 PM      Recommendations   Antiplatelet/Anticoag No indication for antiplatelet therapy at this time .  Surgeon Notes     08/29/2018 9:26 AM CV Procedure signed by Skeet Latch, MD  Indications   Rheumatic heart disease [I09.9 (ICD-10-CM)]  Procedural Details   Technical Details PROCEDURE DESCRIPTION:   The patient was brought to the second floor [facility] cath lab in the postabsorptive state.  She was premedicated with IV Versed and fentanyl.  Her right wrist and antecubital fossa Were prepped and shaved in usual sterile fashion. Xylocaine 1% was used for local anesthesia. A 6 French sheath was inserted into the right radial  artery using standard Seldinger technique.  A 5 French sheath was inserted into the right antecubital vein.  A 5 French balloontipped Swan-Ganz catheter was advanced through the right heart chambers obtaining sequential pressures and pulmonary artery saturations for the determination of Fick cardiac output.  5 [place] TIG catheter and pigtail catheters were used for selective coronary artery angiography, LV pressures and simultaneous LV wedge for the  determination of  mitral valve gradient.  Isovue dye was used for the entirety of the case.  Retrograde aorta, left ventricular pullback pressures were recorded.  The patient received 3500 units  of heparin intravenously.  Radial cocktail was administered via the SideArm sheath. Estimated blood loss <50 mL.   During this procedure medications were administered to achieve and maintain moderate conscious sedation while the patient's heart rate, blood pressure, and oxygen saturation were continuously monitored and I was present face-to-face 100% of this time.  Medications  (Filter: Administrations occurring from 08/29/18 1232 to 08/29/18 1346)  Medication Rate/Dose/Volume Action  Date Time   fentaNYL (SUBLIMAZE) injection (mcg) 25 mcg Given 08/29/18 1254   Total dose as of 09/01/18 1748        25 mcg        midazolam (VERSED) injection (mg) 1 mg Given 08/29/18 1254   Total dose as of 09/01/18 1748        1 mg        heparin injection (Units) 3,000 Units Given 08/29/18 1308   Total dose as of 09/01/18 1748        3,000 Units        Heparin (Porcine) in NaCl 1000-0.9 UT/500ML-% SOLN (mL) 500 mL Given 08/29/18 1309   Total dose as of 09/01/18 1748 500 mL Given 1310   1,000 mL        Radial Cocktail/Verapamil only (mL) 10 mL Given 08/29/18 1310   Total dose as of 09/01/18 1748        10 mL        lidocaine (PF) (XYLOCAINE) 1 % injection (mL) 2 mL Given 08/29/18 1301   Total dose as of 09/01/18 1748 2 mL Given 1304   4 mL        Sedation Time   Sedation Time Physician-1: 30 minutes 24 seconds  Coronary Findings   Diagnostic  Dominance: Right  Left Anterior Descending  Prox LAD lesion 40% stenosed  Prox LAD lesion is 40% stenosed.  Left Circumflex  Mid Cx lesion 40% stenosed  Mid Cx lesion is 40% stenosed.  Intervention   No interventions have been documented.  Right Heart   Right Heart Pressures Hemodynamic findings consistent with mitral valve regurgitation and mitral valve stenosis. Right atrial  pressure- 10/11 Right ventricular pressure- 41/0 Pulmonary artery pressure- 38/13, mean 23 Pulmonary wedge pressure- A-wave equals 19, V wave equals 26, mean equals 17 LVEDP- 14 LV-wedge- 9.4 mm Cardiac output- 4.94 L/min  Coronary Diagrams   Diagnostic  Dominance: Right    Intervention   Implants    No implant documentation for this case.  Syngo Images   Show images for CARDIAC CATHETERIZATION  Images on Long Term Storage   Show images for Angelmarie, Ponzo to Procedure Log   Procedure Log    Hemo Data    Most Recent Value  Fick Cardiac Output 4.94 L/min  Fick Cardiac Output Index 3.21 (L/min)/BSA  Mitral Mean Gradient 9.45 mmHg  Mitral Peak Gradient 3 mmHg  Mitral Valve Area Index 0.88 cm2/BSA  RA A Wave 10 mmHg  RA V Wave 11 mmHg  RA Mean 7 mmHg  RV Systolic Pressure 41 mmHg  RV Diastolic Pressure 0 mmHg  RV EDP 9 mmHg  PA Systolic Pressure 38 mmHg  PA Diastolic Pressure 13 mmHg  PA Mean 23 mmHg  PW A Wave 19 mmHg  PW V Wave 25 mmHg  PW Mean 17 mmHg  AO  Systolic Pressure 563 mmHg  AO Diastolic Pressure 45 mmHg  AO Mean 83 mmHg  LV Systolic Pressure 149 mmHg  LV Diastolic Pressure 0 mmHg  LV EDP 14 mmHg  AOp Systolic Pressure 702 mmHg  AOp Diastolic Pressure 57 mmHg  AOp Mean Pressure 85 mmHg  LVp Systolic Pressure 637 mmHg  LVp Diastolic Pressure 0 mmHg  LVp EDP Pressure 14 mmHg  QP/QS 1  TPVR Index 7.16 HRUI  TSVR Index 25.86 HRUI  PVR SVR Ratio 0.08  TPVR/TSVR Ratio 0.28    CT ANGIOGRAPHY CHEST WITH CONTRAST  TECHNIQUE: Multidetector CT imaging of the chest was performed using the standard protocol during bolus administration of intravenous contrast. Multiplanar CT image reconstructions and MIPs were obtained to evaluate the vascular anatomy.  CONTRAST:  69 mL Isovue 350 MG/ML SOLN  COMPARISON:  06/11/2018  FINDINGS: Cardiovascular: Satisfactory opacification of the pulmonary arteries to the segmental level. No evidence of  pulmonary embolism. 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 Cardiomegaly. No pericardial effusion.  Mediastinum/Nodes: No enlarged mediastinal, hilar, or axillary lymph nodes. Thyroid gland, trachea, and esophagus demonstrate no significant findings.  Lungs/Pleura: Lungs are clear. Redemonstrated post infectious pneumatocele of the left lung base. No pleural effusion or pneumothorax.  Upper Abdomen: No acute abnormality.  Musculoskeletal: No chest wall abnormality. No acute or significant osseous findings.  Review of the MIP images confirms the above findings.  IMPRESSION: 1.  Negative examination for pulmonary embolism.  2.  Cardiomegaly.  3. 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   Electronically Signed   By: Eddie Candle M.D.   On: 08/31/2018 17:11    Impression:  Patient has rheumatic heart disease with mild aortic stenosis, moderate to severe aortic insufficiency, moderate mitral stenosis, and moderate mitral regurgitation.  She presents with a several month history of recurrent persistent atrial fibrillation and worsening symptoms of exertional shortness of breath, vague tightness across her chest, tachypalpitations, and occasional dizzy spells.  I have personally reviewed the patient's transthoracic and transesophageal echocardiograms as well as her recent diagnostic cardiac catheterization and CT angiogram of the chest.  Patient has classical rheumatic disease with significant involvement of both the aortic and mitral valve.  She has severe left atrial enlargement, mild pulmonary hypertension, and moderate tricuspid regurgitation.  She does not have significant coronary artery disease.  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  I feel she would best be treated with aortic valve replacement,  mitral valve replacement, and Maze procedure.  She may need concomitant tricuspid valve repair.  Plication or replacement of the ascending thoracic aorta may be prudent.  Risks associated with the surgery of this magnitude will unquestionably be significant, but the patient appears to be remarkably healthy and physically active for her age.  I suspect that she will do reasonably well.   Plan:  I have discussed the nature of the patient's complex valvular heart disease and the recent development of recurrent persistent atrial fibrillation with the patient this afternoon.  The natural history of rheumatic heart disease with combined aortic and mitral valve disease complicated by chronic diastolic congestive heart failure and atrial fibrillation was discussed.  Treatment options were discussed including continued medical therapy versus complex surgical intervention to include combined aortic and mitral valve disease with Maze procedure and possible tricuspid valve repair.  Expectations for the patient's  postoperative convalescence have been discussed.  The patient is eager to proceed with surgery as soon as practical.  Given the patient's multiple presentations to the emergency department I favor keeping her in the hospital for the next several days with plans to proceed with surgery towards the end of the week after Eliquis has completely washed out of her system.  I would continue continuous heparin during the interim.  I also feel that she should be started on amiodarone preoperatively.  We will continue to follow closely.  I have updated the patient's daughter over the telephone.  All questions answered.   I spent in excess of 120 minutes during the conduct of this hospital consultation and >50% of this time involved direct face-to-face encounter for counseling and/or coordination of the patient's care.    Valentina Gu. Roxy Manns, MD 09/01/2018 5:23 PM

## 2018-09-01 NOTE — Progress Notes (Signed)
ANTICOAGULATION CONSULT NOTE - [facility] for Heparin Indication: atrial fibrillation  Allergies  Allergen Reactions  . Bee Venom Anaphylaxis  . Boniva [Ibandronic Acid]     Upset stomach, flu like symptoms   . Lisinopril Cough  . Latex Rash    Patient Measurements: Height: 5\' 1"  (154.9 cm) Weight: 122 lb 4.8 oz (55.5 kg) IBW/kg (Calculated) : 47.8 Heparin Dosing Weight: 55.5 kg  Vital Signs: Temp: 98.1 F (36.7 C) (05/24 1529) Temp Source: Oral (05/24 1529) BP: 111/82 (05/24 1804) Pulse Rate: 79 (05/24 1529)  Labs: Recent Labs    08/31/18 0915 08/31/18 1451 09/01/18 0541 09/01/18 1807  HGB 13.8  --  13.0  --   HCT 40.4  --  38.5  --   PLT 239  --  225  --   APTT  --   --   --  68*  HEPARINUNFRC  --   --   --  1.58*  CREATININE 0.71  --  0.75  --   TROPONINI <0.03 <0.03  --   --     Estimated Creatinine Clearance: 45.8 mL/min (by C-G formula based on SCr of 0.75 mg/dL).   Medical History: Past Medical History:  Diagnosis Date  . Aortic atherosclerosis ([place])   . Aortic insufficiency, rheumatic   . 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 Atrial fibrillation ([place]) 05/10/2018  . Colon polyps   . Essential hypertension, benign   . History of seasonal allergies   . Hypertension   . Hypothyroidism   . Mitral stenosis with regurgitation, rheumatic   . Osteopenia 2006  . Persistent atrial fibrillation   . Tricuspid regurgitation      Assessment: 67 yoF with hx AFib on Eliquis PTA admitted with symptomatic MV stenosis. Pharmacy asked to transition to IV heparin while holding Eliquis in anticipation of surgery. Initial aPTT therapeutic, heparin level falsely high due to DOAC.  Goal of Therapy:  Heparin level 0.3-0.7 units/ml Monitor platelets by anticoagulation protocol: Yes   Plan:  Continue IV heparin at 800 units/hr. Daily aPTT and heparin level until correlating   Arrie Senate, PharmD, BCPS Clinical  Pharmacist [phone] Please check AMION for all [facility] numbers 09/01/2018

## 2018-09-01 NOTE — Progress Notes (Addendum)
ANTICOAGULATION CONSULT NOTE - Initial Consult  Pharmacy Consult for Heparin Indication: atrial fibrillation  Allergies  Allergen Reactions  . Bee Venom Anaphylaxis  . Boniva [Ibandronic Acid]     Upset stomach, flu like symptoms   . Lisinopril Cough  . Latex Rash    Patient Measurements: Height: 5\' 1"  (154.9 cm) Weight: 122 lb 4.8 oz (55.5 kg) IBW/kg (Calculated) : 47.8 Heparin Dosing Weight: 55.5 kg  Vital Signs: BP: 89/77 (05/24 0846)  Labs: Recent Labs    08/29/18 1325 08/31/18 0915 08/31/18 1451 09/01/18 0541  HGB 10.2* 13.8  --  13.0  HCT 30.0* 40.4  --  38.5  PLT  --  239  --  225  CREATININE  --  0.71  --  0.75  TROPONINI  --  <0.03 <0.03  --     Estimated Creatinine Clearance: 45.8 mL/min (by C-G formula based on SCr of 0.75 mg/dL).   Medical History: Past Medical History:  Diagnosis Date  . Aortic atherosclerosis ([place]) 05/07/2018  . Atrial fibrillation ([place]) 05/15/2018  . Colon polyps   . History of seasonal allergies   . Hypertension   . Osteopenia 2006     Assessment:  CC/HPI: symptomatic with recurrent episodes of dizziness, falls, shortness of breath.    NIO:EVOJJK atherosclerosis, Afib, colon polyps, seasonal allergies, HTN, osteopenia, MV dz with MV stenosis, vertigo, panic attacks,   Anticoag: h/o afib on Eliquis PTA. (LD 5/23 PM). Hgb 13. Plts 225. MV stenosis: involve surgery for discussion of plan for her valve..   Goal of Therapy:  Heparin level 0.3-0.7 units/ml Monitor platelets by anticoagulation protocol: Yes   Plan:  Start IV heparin (no bolus requested) at 800 units/hr. Check heparin level in 6-8 hrs Daily HL and CBC  Alphus Zeck S. Alford Highland, PharmD, BCPS Clinical Staff Pharmacist Eilene Ghazi Stillinger 09/01/2018,9:56 AM

## 2018-09-01 NOTE — Progress Notes (Signed)
Progress Note  Patient Name: Hannah Roy Date of Encounter: 09/01/2018  Primary Cardiologist: Kate Sable, MD   Subjective   Complains of DOE, dizziness with exertion and chest tightness  Inpatient Medications    Scheduled Meds: . diltiazem  240 mg Oral Daily  . losartan  25 mg Oral Daily  . metoprolol tartrate  25 mg Oral BID   Continuous Infusions: . sodium chloride 75 mL/hr at 08/31/18 2305  . heparin 800 Units/hr (09/01/18 1016)  . sodium chloride     PRN Meds: ondansetron **OR** ondansetron (ZOFRAN) IV   Vital Signs    Vitals:   09/01/18 0833 09/01/18 0839 09/01/18 0841 09/01/18 0846  BP: 125/72 (!) 121/91 98/80 (!) 89/77  Pulse:      Resp: 16 16 17  (!) 25  Temp:      TempSrc:      SpO2:      Weight:      Height:        Intake/Output Summary (Last 24 hours) at 09/01/2018 1018 Last data filed at 09/01/2018 0823 Gross per 24 hour  Intake 663.29 ml  Output -  Net 663.29 ml   Last 3 Weights 08/31/2018 08/31/2018 08/29/2018  Weight (lbs) 122 lb 4.8 oz 120 lb 123 lb  Weight (kg) 55.475 kg 54.432 kg 55.792 kg      Telemetry    Atrial fibrillation with intermittent elevated rates- Personally Reviewed  Physical Exam   GEN: No acute distress.   Neck: No JVD Cardiac: RRR, 2/6 systolic and diastolic mumur Respiratory: Clear to auscultation bilaterally. GI: Soft, nontender, non-distended  MS: No edema Neuro:  Nonfocal  Psych: Normal affect   Labs    Chemistry Recent Labs  Lab 08/29/18 1325 08/31/18 0915 09/01/18 0541  NA 142 141 142  K 3.5 3.7 3.3*  CL  --  109 108  CO2  --  20* 22  GLUCOSE  --  110* 100*  BUN  --  15 11  CREATININE  --  0.71 0.75  CALCIUM  --  9.0 8.8*  GFRNONAA  --  >60 >60  GFRAA  --  >60 >60  ANIONGAP  --  12 12     Hematology Recent Labs  Lab 08/29/18 1325 08/31/18 0915 09/01/18 0541  WBC  --  5.6 4.9  RBC  --  4.35 4.18  HGB 10.2* 13.8 13.0  HCT 30.0* 40.4 38.5  MCV  --  92.9 92.1  MCH  --  31.7 31.1   MCHC  --  34.2 33.8  RDW  --  12.1 12.1  PLT  --  239 225    Cardiac Enzymes Recent Labs  Lab 08/31/18 0915 08/31/18 1451  TROPONINI <0.03 <0.03    BNP Recent Labs  Lab 08/31/18 1451  BNP 549.0*     Radiology    Ct Head Wo Contrast  Result Date: 08/31/2018 CLINICAL DATA:  Ataxia, stroke suspected. Dizziness. EXAM: CT HEAD WITHOUT CONTRAST TECHNIQUE: Contiguous axial images were obtained from the base of the skull through the vertex without intravenous contrast. COMPARISON:  Brain MRI 10/30/2017 FINDINGS: Brain: There is no evidence of acute infarct, intracranial hemorrhage, mass, midline shift, or extra-axial fluid collection. Mild cerebral atrophy is within normal limits for age. Vascular: Calcified atherosclerosis at the skull base. No hyperdense vessel. Skull: No fracture. 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 Sinuses/Orbits: Partially visualized mild mucosal thickening in the maxillary and ethmoid sinuses. Clear mastoid air cells. Unremarkable orbits. Other: None. IMPRESSION:  No evidence of acute intracranial abnormality. Electronically Signed   By: Logan Bores M.D.   On: 08/31/2018 16:58   Ct Angio Chest Pe W/cm &/or Wo Cm  Result Date: 08/31/2018 CLINICAL DATA:  Chest pain, shortness of breath EXAM: CT ANGIOGRAPHY CHEST WITH CONTRAST TECHNIQUE: Multidetector CT imaging of the chest was performed using the standard protocol during bolus administration of intravenous contrast. Multiplanar CT image reconstructions and MIPs were obtained to evaluate the vascular anatomy. CONTRAST:  69 mL Isovue 350 MG/ML SOLN COMPARISON:  06/11/2018 FINDINGS: Cardiovascular: Satisfactory opacification of the pulmonary arteries to the segmental level. No evidence of pulmonary embolism. 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 Cardiomegaly. No pericardial effusion. Mediastinum/Nodes: No enlarged mediastinal, hilar, or axillary lymph  nodes. Thyroid gland, trachea, and esophagus demonstrate no significant findings. Lungs/Pleura: Lungs are clear. Redemonstrated post infectious pneumatocele of the left lung base. No pleural effusion or pneumothorax. Upper Abdomen: No acute abnormality. Musculoskeletal: No chest wall abnormality. No acute or significant osseous findings. Review of the MIP images confirms the above findings. IMPRESSION: 1.  Negative examination for pulmonary embolism. 2.  Cardiomegaly. 3. 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 Electronically Signed   By: Eddie Candle M.D.   On: 08/31/2018 17:11   Dg Chest Port 1 View  Result Date: 08/31/2018 CLINICAL DATA:  78 year old female with history of chest pain and shortness of breath. Dizziness. EXAM: PORTABLE CHEST 1 VIEW COMPARISON:  Chest x-ray 05/10/2018. FINDINGS: Lung volumes are normal. No consolidative airspace disease. No pleural effusions. No suspicious appearing pulmonary nodules or masses. No pneumothorax. No evidence of pulmonary edema. Mild cardiomegaly. Upper mediastinal contours are within normal limits. Aortic atherosclerosis. IMPRESSION: 1. No radiographic evidence of acute cardiopulmonary disease. 2. Mild cardiomegaly. 3. Aortic atherosclerosis. Electronically Signed   By: Vinnie Langton M.D.   On: 08/31/2018 10:05    Patient Profile     78 y.o. female with MS, AS/AI, dilated aortic root, persistent atrial fibrillation admitted with continuing dyspnea, dizziness and palpitations.  Assessment & Plan    1 rheumatic heart disease-patient with mitral stenosis, aortic stenosis, aortic insufficiency and dilated aortic root.  She has worsening symptoms including progressive dyspnea on exertion, palpitations and dizziness with ambulation.  Some of symptoms likely exacerbated by atrial fibrillation.  Original plan was to see Dr. Roxy Manns as an outpatient.  Given progressive symptoms I will ask him to  review in-house.  2 persistent atrial fibrillation-plan to continue Cardizem and beta-blocker for rate control.  Apixaban has been discontinued.  Continue IV heparin. CHADSvasc 5.  Also with mitral valve disease.  Will need Maze procedure at time of valve surgery.  3 hypertension-patient's blood pressure is controlled.  Continue present medications and follow.  4 thoracic aortic aneurysm-4.4 x 4.3 cm on recent CT.  5 nonobstructive coronary disease-noted on recent catheterization.  For questions or updates, please contact [facility] Please consult [email or web address] for contact info under        Signed, Kirk Ruths, MD  09/01/2018, 10:18 AM

## 2018-09-01 NOTE — Progress Notes (Signed)
PROGRESS NOTE   Hannah Roy  BJS:[id number]    DOB: 1940/05/10    DOA: 08/31/2018  PCP: Kathyrn Drown, MD   I have briefly reviewed patients previous medical records in [facility].  Brief Narrative:  78 year old married female of [place] desent, PMH of rheumatic heart disease, HTN, osteoporosis, has had significant medical issues since 05/10/2018, hospitalized 1/31- 2/1 for A. fib with RVR, seen in ED 5/1 for A. fib with RVR and successfully cardioverted with one 100 J shock, cardiology evaluated at A. fib clinic via telehealth on 5/12 at which time metoprolol was added to her regimen and it was felt that her valvular heart disease was driving her A. fib and hence discussed with TCTS who suggested TEE and right and left heart catheterization for possible AVR/MVR/maze consideration, TEE 5/21: LVEF 55-60%, moderate MS, trivial MR, moderate AR, mild AS and moderate TR noted, cardiac catheterization 5/21 which showed noncritical CAD, RHD with moderate MS, severe AI and normal LV function and plan was to follow-up with Dr. Roxy Manns, TCTS as outpatient for possible multivalve replacement.  She now presented to [facility] ED and transferred to [facility] on 5/23 due to severe dizziness in upright position, extreme difficulty in walking due to dizziness, dyspnea on minimal exertion, some chest discomfort with activity and anxiety.  In the ED noted to be severely orthostatic and intermittent A. fib with RVR.  Cardiology and TCTS consulted.   Assessment & Plan:   Principal Problem:   Orthostasis Active Problems:   Essential hypertension, benign   Atrial fibrillation with RVR (HCC)   Hypothyroidism   Rheumatic heart disease   Orthostatic hypotension   Orthostatic hypotension  On admission supine BP 105/80, HR 104 >standing 72/54, HR 120.  Severely orthostatic and symptomatic of same.  Etiology not clear.  Hydration appears borderline and not very dehydrated, no history of GI losses or febrile illness and not on  diuretics PTA.  Could be due to medications i.e. Cardizem and will defer to cardiology regarding stopping this.  Low index of suspicion for neurogenic/autonomic etiology.  Physical deconditioning may contribute.  Treated with IV fluids since admission but persistently orthostatic today: Supine BP 125/72, HR 97 >standing BP 98/80, HR 126.  TSH normal.  SARS coronavirus 2 testing negative on 5/18 and 5/23.  CT head without acute findings.  CTA chest: No PE.  Added TED hoses.  Continue IV fluids.  Monitor closely.  Persistent A. fib with periodic RVR  Currently on Cardizem CD 240 mg daily, metoprolol 25 mg twice daily.  Cardiology input appreciated.  If patient has persistent orthostatic hypotension, Cardiology to consider discontinuing Cardizem and starting something else for rate control?  Amiodarone.  I consulted Dr. Roxy Manns, TCTS who will evaluate her in the hospital for non-emergency multivalve surgery, recommends holding Eliquis and may consider starting IV heparin infusion without bolus-started.  Will need Maze procedure at time of valve surgery.  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  Patient has been symptomatic and progressively worsening since 1/31 with complaints of progressive dyspnea on exertion, palpitations, dizziness with activity.  Initially was supposed to see Dr. Roxy Manns as outpatient, I have discussed with him and requested inpatient consultation.  Essential hypertension  Patient currently on Cardizem, metoprolol and losartan.  May consider discontinuing losartan given issues with orthostatic hypotension.  Thoracic aortic aneurysm  4.4 x 4.3 cm on recent CT.  Outpatient follow-up.  Anxiety  Likely precipitated by ongoing health issues.  Reassured.  Hypothyroid  Clinically euthyroid.  TSH normal.  Hypokalemia  Replaced today.  Magnesium normal.    DVT prophylaxis:  Last dose of Eliquis 5/23 night.  Started IV heparin infusion without bolus on 5/24. Code Status: Full Family Communication: None at bedside Disposition: Determined pending clinical improvement and further evaluation.  Patient remains symptomatic of severe orthostatic hypotension of unclear etiology that needs further evaluation and inpatient treatment, intermittent A. fib with RVR and evaluation by TCTS for multivalve surgery.  She will thereby need inpatient hospitalization.   Consultants:  Cardiology Cardiothoracic surgery  Procedures:  None  Antimicrobials:  None   Subjective: Patient reports that she has been unwell since 05/10/2018 with frequent hospital and MD visits.  Usually independent.  Reports being unable to sleep at night for several days due to worrying about her heart issues.  While in bed, denies dizziness but just getting out of bed causes her to become severely dizzy and she is unable to even walk a few steps without feeling like she is going to pass out.  Intermittent palpitations and dyspnea with exertion.  No chest pain.  Was able to sleep well last night after many days.  ROS: Above, otherwise negative.  Objective:  Vitals:   09/01/18 0833 09/01/18 0839 09/01/18 0841 09/01/18 0846  BP: 125/72 (!) 121/91 98/80 (!) 89/77  Pulse:      Resp: 16 16 17  (!) 25  Temp:      TempSrc:      SpO2:      Weight:      Height:        Examination:  General exam: Pleasant elderly female, moderately built and thinly nourished, lying comfortably propped up in bed.  Oral mucosa borderline hydration. Respiratory system: Clear to auscultation. Respiratory effort normal. Cardiovascular system: S1 & S2 heard, irregularly irregular. No JVD, murmurs, rubs, gallops or clicks. No pedal edema.  Telemetry personally reviewed: Overnight had A. fib with rate mostly controlled in the 70s and 80s.  Since this morning, intermittent RVR mostly in the 100s but occasionally up to 160s with  activity. Gastrointestinal system: Abdomen is nondistended, soft and nontender. No organomegaly or masses felt. Normal bowel sounds heard. Central nervous system: Alert and oriented. No focal neurological deficits. Extremities: Symmetric 5 x 5 power. Skin: No rashes, lesions or ulcers Psychiatry: Judgement and insight appear normal. Mood & affect appropriate.     Data Reviewed: I have personally reviewed following labs and imaging studies  CBC: Recent Labs  Lab 08/29/18 1321 08/29/18 1322 08/29/18 1325 08/31/18 0915 09/01/18 0541  WBC  --   --   --  5.6 4.9  HGB 10.2* 10.2* 10.2* 13.8 13.0  HCT 30.0* 30.0* 30.0* 40.4 38.5  MCV  --   --   --  92.9 92.1  PLT  --   --   --  239 992   Basic Metabolic Panel: Recent Labs  Lab 08/29/18 1321 08/29/18 1322 08/29/18 1325 08/31/18 0915 09/01/18 0541  NA 142 142 142 141 142  K 3.5 3.5 3.5 3.7 3.3*  CL  --   --   --  109 108  CO2  --   --   --  20* 22  GLUCOSE  --   --   --  110* 100*  BUN  --   --   --  15 11  CREATININE  --   --   --  0.71  0.75  CALCIUM  --   --   --  9.0 8.8*  MG  --   --   --   --  2.0   Cardiac Enzymes: Recent Labs  Lab 08/31/18 0915 08/31/18 1451  TROPONINI <0.03 <0.03     Recent Results (from the past 240 hour(s))  Novel Coronavirus, NAA (hospital order; send-out to ref lab)     Status: None   Collection Time: 08/26/18 11:02 AM  Result Value Ref Range Status   SARS-CoV-2, NAA NOT DETECTED NOT DETECTED Final    Comment: (NOTE) Testing was performed using the cobas(R) SARS-CoV-2 test. This test was developed and its performance characteristics determined by [employer]. This test has not been FDA cleared or approved. This test has been authorized by FDA under an Emergency Use Authorization (EUA). This test is only authorized for the duration of time the declaration that circumstances exist justifying the authorization of the emergency use of in vitro diagnostic tests for detection of  SARS-CoV-2 virus and/or diagnosis of COVID-19 infection under section 564(b)(1) of the Act, 21 U.S.C. 188CZY-6(A)(6), unless the authorization is terminated or revoked sooner. When diagnostic testing is negative, the possibility of a false negative result should be considered in the context of a patient's recent exposures and the presence of clinical signs and symptoms consistent with COVID-19. An individual without symptoms of COVID-19 and who is not shedding SARS-CoV-2 virus would expect to have  a negative (not detected) result in this assay. Performed At: [facility] [address] [place], [place] [id number] Rush Farmer MD AT:[id number]    [place]  Final    Comment: Performed at [facility], [place] [address], [place], [place] [address]  SARS Coronavirus 2 (CEPHEID - Performed in [place] hospital lab), Hosp Order     Status: None   Collection Time: 08/31/18  4:21 PM  Result Value Ref Range Status   SARS Coronavirus 2 NEGATIVE NEGATIVE Final    Comment: (NOTE) If result is NEGATIVE SARS-CoV-2 target nucleic acids are NOT DETECTED. The SARS-CoV-2 RNA is generally detectable in upper and lower  respiratory specimens during the acute phase of infection. The lowest  concentration of SARS-CoV-2 viral copies this assay can detect is 250  copies / mL. A negative result does not preclude SARS-CoV-2 infection  and should not be used as the sole basis for treatment or other  patient management decisions.  A negative result may occur with  improper specimen collection / handling, submission of specimen other  than nasopharyngeal swab, presence of viral mutation(s) within the  areas targeted by this assay, and inadequate number of viral copies  (<250 copies / mL). A negative result must be combined with clinical  observations, patient history, and epidemiological information. If result is POSITIVE SARS-CoV-2 target nucleic acids are  DETECTED. The SARS-CoV-2 RNA is generally detectable in upper and lower  respiratory specimens dur ing the acute phase of infection.  Positive  results are indicative of active infection with SARS-CoV-2.  Clinical  correlation with patient history and other diagnostic information is  necessary to determine patient infection status.  Positive results do  not rule out bacterial infection or co-infection with other viruses. If result is PRESUMPTIVE POSTIVE SARS-CoV-2 nucleic acids MAY BE PRESENT.   A presumptive positive result was obtained on the submitted specimen  and confirmed on repeat testing.  While 2019 novel coronavirus  (SARS-CoV-2) nucleic acids may be present in the submitted sample  additional confirmatory  testing may be necessary for epidemiological  and / or clinical management purposes  to differentiate between  SARS-CoV-2 and other Sarbecovirus currently known to infect humans.  If clinically indicated additional testing with an alternate test  methodology [phone]) is advised. The SARS-CoV-2 RNA is generally  detectable in upper and lower respiratory sp ecimens during the acute  phase of infection. The expected result is Negative. Fact Sheet for Patients:  StrictlyIdeas.no Fact Sheet for Healthcare Providers: BankingDealers.co.za This test is not yet approved or cleared by the [place] FDA and has been authorized for detection and/or diagnosis of SARS-CoV-2 by FDA under an Emergency Use Authorization (EUA).  This EUA will remain in effect (meaning this test can be used) for the duration of the COVID-19 declaration under Section 564(b)(1) of the Act, 21 U.S.C. section 360bbb-3(b)(1), unless the authorization is terminated or revoked sooner. Performed at [facility], [address] [place], [place] [address]          Radiology Studies: Ct Head Wo Contrast  Result Date: 08/31/2018 CLINICAL DATA:  Ataxia, stroke  suspected. Dizziness. EXAM: CT HEAD WITHOUT CONTRAST TECHNIQUE: Contiguous axial images were obtained from the base of the skull through the vertex without intravenous contrast. COMPARISON:  Brain MRI 10/30/2017 FINDINGS: Brain: There is no evidence of acute infarct, intracranial hemorrhage, mass, midline shift, or extra-axial fluid collection. Mild cerebral atrophy is within normal limits for age. Vascular: Calcified atherosclerosis at the skull base. No hyperdense vessel. Skull: No fracture. 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 Sinuses/Orbits: Partially visualized mild mucosal thickening in the maxillary and ethmoid sinuses. Clear mastoid air cells. Unremarkable orbits. Other: None. IMPRESSION: No evidence of acute intracranial abnormality. Electronically Signed   By: Logan Bores M.D.   On: 08/31/2018 16:58   Ct Angio Chest Pe W/cm &/or Wo Cm  Result Date: 08/31/2018 CLINICAL DATA:  Chest pain, shortness of breath EXAM: CT ANGIOGRAPHY CHEST WITH CONTRAST TECHNIQUE: Multidetector CT imaging of the chest was performed using the standard protocol during bolus administration of intravenous contrast. Multiplanar CT image reconstructions and MIPs were obtained to evaluate the vascular anatomy. CONTRAST:  69 mL Isovue 350 MG/ML SOLN COMPARISON:  06/11/2018 FINDINGS: Cardiovascular: Satisfactory opacification of the pulmonary arteries to the segmental level. No evidence of pulmonary embolism. 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 Cardiomegaly. No pericardial effusion. Mediastinum/Nodes: No enlarged mediastinal, hilar, or axillary lymph nodes. Thyroid gland, trachea, and esophagus demonstrate no significant findings. Lungs/Pleura: Lungs are clear. Redemonstrated post infectious pneumatocele of the left lung base. No pleural effusion or pneumothorax. Upper Abdomen: No acute abnormality. Musculoskeletal: No chest wall abnormality. No acute or  significant osseous findings. Review of the MIP images confirms the above findings. IMPRESSION: 1.  Negative examination for pulmonary embolism. 2.  Cardiomegaly. 3. 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 Electronically Signed   By: Eddie Candle M.D.   On: 08/31/2018 17:11   Dg Chest Port 1 View  Result Date: 08/31/2018 CLINICAL DATA:  78 year old female with history of chest pain and shortness of breath. Dizziness. EXAM: PORTABLE CHEST 1 VIEW COMPARISON:  Chest x-ray 05/10/2018. FINDINGS: Lung volumes are normal. No consolidative airspace disease. No pleural effusions. No suspicious appearing pulmonary nodules or masses. No pneumothorax. No evidence of pulmonary edema. Mild cardiomegaly. Upper mediastinal contours are within normal limits. Aortic atherosclerosis. IMPRESSION: 1. No radiographic evidence of  acute cardiopulmonary disease. 2. Mild cardiomegaly. 3. Aortic atherosclerosis. Electronically Signed   By: Vinnie Langton M.D.   On: 08/31/2018 10:05        Scheduled Meds:  diltiazem  240 mg Oral Daily   losartan  25 mg Oral Daily   metoprolol tartrate  25 mg Oral BID   Continuous Infusions:  sodium chloride 75 mL/hr at 08/31/18 2305   heparin 800 Units/hr (09/01/18 1016)   sodium chloride       LOS: 0 days     Vernell Leep, MD, FACP, [facility]. [facility]  To contact the attending provider between 7A-7P or the covering provider during after hours 7P-7A, please log into the web site [email or web address] and access using universal [place] password for that web site. If you do not have the password, please call the hospital operator.  09/01/2018, 11:12 AM

## 2018-09-01 NOTE — Plan of Care (Signed)

## 2018-09-02 DIAGNOSIS — I061 Rheumatic aortic insufficiency: Secondary | ICD-10-CM

## 2018-09-02 DIAGNOSIS — I77810 Thoracic aortic ectasia: Secondary | ICD-10-CM

## 2018-09-02 DIAGNOSIS — I099 Rheumatic heart disease, unspecified: Secondary | ICD-10-CM

## 2018-09-02 LAB — CBC
HCT: 36.4 % (ref 36.0–46.0)
Hemoglobin: 12.3 g/dL (ref 12.0–15.0)
MCH: 31.2 pg (ref 26.0–34.0)
MCHC: 33.8 g/dL (ref 30.0–36.0)
MCV: 92.4 fL (ref 80.0–100.0)
Platelets: 228 10*3/uL (ref 150–400)
RBC: 3.94 MIL/uL (ref 3.87–5.11)
RDW: 12.1 % (ref 11.5–15.5)
WBC: 5.3 10*3/uL (ref 4.0–10.5)
nRBC: 0 % (ref 0.0–0.2)

## 2018-09-02 LAB — BLOOD GAS, ARTERIAL
Acid-base deficit: 1.4 mmol/L (ref 0.0–2.0)
Bicarbonate: 21.9 mmol/L (ref 20.0–28.0)
Drawn by: 24487
O2 Saturation: 98.1 %
Patient temperature: 97.8
pCO2 arterial: 30.2 mmHg — ABNORMAL LOW (ref 32.0–48.0)
pH, Arterial: 7.472 — ABNORMAL HIGH (ref 7.350–7.450)
pO2, Arterial: 102 mmHg (ref 83.0–108.0)

## 2018-09-02 LAB — COMPREHENSIVE METABOLIC PANEL
ALT: 20 U/L (ref 0–44)
AST: 15 U/L (ref 15–41)
Albumin: 3.5 g/dL (ref 3.5–5.0)
Alkaline Phosphatase: 64 U/L (ref 38–126)
Anion gap: 8 (ref 5–15)
BUN: 12 mg/dL (ref 8–23)
CO2: 22 mmol/L (ref 22–32)
Calcium: 8.5 mg/dL — ABNORMAL LOW (ref 8.9–10.3)
Chloride: 111 mmol/L (ref 98–111)
Creatinine, Ser: 0.76 mg/dL (ref 0.44–1.00)
GFR calc Af Amer: >60 mL/min (ref >60)
GFR calc non Af Amer: >60 mL/min (ref >60)
Glucose, Bld: 104 mg/dL — ABNORMAL HIGH (ref 70–99)
Potassium: 3.8 mmol/L (ref 3.5–5.1)
Sodium: 141 mmol/L (ref 135–145)
Total Bilirubin: 1 mg/dL (ref 0.3–1.2)
Total Protein: 6.3 g/dL — ABNORMAL LOW (ref 6.5–8.1)

## 2018-09-02 LAB — PREALBUMIN: Prealbumin: 14.5 mg/dL — ABNORMAL LOW (ref 18–38)

## 2018-09-02 LAB — PROTIME-INR
INR: 1.2 (ref 0.8–1.2)
Prothrombin Time: 14.9 seconds (ref 11.4–15.2)

## 2018-09-02 LAB — HEPARIN LEVEL (UNFRACTIONATED): Heparin Unfractionated: 1.05 IU/mL — ABNORMAL HIGH (ref 0.30–0.70)

## 2018-09-02 LAB — APTT: aPTT: 93 seconds — ABNORMAL HIGH (ref 24–36)

## 2018-09-02 MED ORDER — LORAZEPAM 0.5 MG PO TABS
0.5000 mg | ORAL_TABLET | Freq: Once | ORAL | Status: AC
  Administered 2018-09-02: 0.5 mg via ORAL
  Filled 2018-09-02: qty 1

## 2018-09-02 NOTE — Plan of Care (Signed)

## 2018-09-02 NOTE — Progress Notes (Signed)
ANTICOAGULATION CONSULT NOTE - [place] for Heparin Indication: atrial fibrillation  Allergies  Allergen Reactions  . Bee Venom Anaphylaxis  . Boniva [Ibandronic Acid]     Upset stomach, flu like symptoms   . Lisinopril Cough  . Latex Rash    Patient Measurements: Height: 5\' 1"  (154.9 cm) Weight: 123 lb (55.8 kg) IBW/kg (Calculated) : 47.8 Heparin Dosing Weight: 55.5 kg  Vital Signs: Temp: 98 F (36.7 C) (05/25 0629) Temp Source: Oral (05/25 0629) BP: 103/75 (05/25 0629) Pulse Rate: 87 (05/25 0629)  Labs: Recent Labs    08/31/18 0915 08/31/18 1451 09/01/18 0541 09/01/18 1807 09/02/18 0452  HGB 13.8  --  13.0  --  12.3  HCT 40.4  --  38.5  --  36.4  PLT 239  --  225  --  228  APTT  --   --   --  68* 93*  LABPROT  --   --   --   --  14.9  INR  --   --   --   --  1.2  HEPARINUNFRC  --   --   --  1.58* 1.05*  CREATININE 0.71  --  0.75  --  0.76  TROPONINI <0.03 <0.03  --   --   --     Estimated Creatinine Clearance: 45.8 mL/min (by C-G formula based on SCr of 0.76 mg/dL).   Medical History: Past Medical History:  Diagnosis Date  . Aortic atherosclerosis ([place])   . Aortic insufficiency, rheumatic   . 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 Atrial fibrillation ([place]) 05/10/2018  . Colon polyps   . Essential hypertension, benign   . History of seasonal allergies   . Hypertension   . Hypothyroidism   . Mitral stenosis with regurgitation, rheumatic   . Osteopenia 2006  . Persistent atrial fibrillation   . Tricuspid regurgitation      Assessment: 35 yoF with hx AFib on Eliquis PTA admitted with symptomatic MV stenosis. Pharmacy asked to transition to IV heparin while holding Eliquis in anticipation of surgery. Initial aPTT therapeutic, heparin level falsely high due to DOAC.  5/25 AM: Heparin level this morning continues to be falsely elevated with recent apixaban dose. aPTT continues to be within therapeutic range at 93. Hgb 12.3,  pltc 228; stable. Continue with both heparin levels and aPTT until correlating.  Goal of Therapy:  Heparin level 0.3-0.7 units/ml aPTT 66-102 seconds Monitor platelets by anticoagulation protocol: Yes   Plan:  Continue IV heparin at 800 units/hr. Daily aPTT and heparin level until correlating  Thank you for involving pharmacy in this patient's care.  Janae Bridgeman, PharmD PGY1 Pharmacy Resident Phone: [phone] 09/02/2018 7:18 AM

## 2018-09-02 NOTE — Progress Notes (Signed)
Progress Note  Patient Name: Hannah Roy Date of Encounter: 09/02/2018  Primary Cardiologist: Hannah Sable, MD  Subjective   Seated in bed, no shortness of breath or chest pain, no active sense of palpitations.  No orthopnea or PND.  Inpatient Medications    Scheduled Meds: . amiodarone  200 mg Oral BID PC  . diltiazem  240 mg Oral Daily  . losartan  25 mg Oral Daily  . metoprolol tartrate  25 mg Oral BID   Continuous Infusions: . heparin 800 Units/hr (09/01/18 2300)  . sodium chloride     PRN Meds: ondansetron **OR** ondansetron (ZOFRAN) IV   Vital Signs    Vitals:   09/01/18 1529 09/01/18 1804 09/01/18 2113 09/02/18 0629  BP: 92/69 111/82 105/89 103/75  Pulse: 79  93 87  Resp: (!) 23  (!) 36 16  Temp: 98.1 F (36.7 C)  97.8 F (36.6 C) 98 F (36.7 C)  TempSrc: Oral  Oral Oral  SpO2: 99%  97% 97%  Weight:    55.8 kg  Height:        Intake/Output Summary (Last 24 hours) at 09/02/2018 0905 Last data filed at 09/02/2018 0805 Gross per 24 hour  Intake 784.21 ml  Output 300 ml  Net 484.21 ml   Filed Weights   08/31/18 0905 08/31/18 2145 09/02/18 0629  Weight: 54.4 kg 55.5 kg 55.8 kg    Telemetry    Atrial fibrillation.  Personally reviewed.  ECG    Tracing from 08/31/2018 shows atrial fibrillation with LVH and repolarization abnormalities.  Personally reviewed.  Physical Exam   GEN: No acute distress.   Neck: No JVD. Cardiac:  Irregularly irregular, soft systolic/diastolic murmurs, no gallop.  Respiratory: Nonlabored. Clear to auscultation bilaterally. GI: Soft, nontender, bowel sounds present. Hannah: No edema; No deformity. Neuro:  Nonfocal. Psych: Alert and oriented x 3. Normal affect.  Labs    Chemistry Recent Labs  Lab 08/31/18 0915 09/01/18 0541 09/02/18 0452  NA 141 142 141  K 3.7 3.3* 3.8  CL 109 108 111  CO2 20* 22 22  GLUCOSE 110* 100* 104*  BUN 15 11 12   CREATININE 0.71 0.75 0.76  CALCIUM 9.0 8.8* 8.5*  PROT  --   --   6.3*  ALBUMIN  --   --  3.5  AST  --   --  15  ALT  --   --  20  ALKPHOS  --   --  64  BILITOT  --   --  1.0  GFRNONAA >60 >60 >60  GFRAA >60 >60 >60  ANIONGAP 12 12 8      Hematology Recent Labs  Lab 08/31/18 0915 09/01/18 0541 09/02/18 0452  WBC 5.6 4.9 5.3  RBC 4.35 4.18 3.94  HGB 13.8 13.0 12.3  HCT 40.4 38.5 36.4  MCV 92.9 92.1 92.4  MCH 31.7 31.1 31.2  MCHC 34.2 33.8 33.8  RDW 12.1 12.1 12.1  PLT 239 225 228    Cardiac Enzymes Recent Labs  Lab 08/31/18 0915 08/31/18 1451  TROPONINI <0.03 <0.03   No results for input(s): TROPIPOC in the last 168 hours.   BNP Recent Labs  Lab 08/31/18 1451  BNP 549.0*     Radiology    Ct Head Wo Contrast  Result Date: 08/31/2018 CLINICAL DATA:  Ataxia, stroke suspected. Dizziness. EXAM: CT HEAD WITHOUT CONTRAST TECHNIQUE: Contiguous axial images were obtained from the base of the skull through the vertex without intravenous contrast. COMPARISON:  Brain MRI  10/30/2017 FINDINGS: Brain: There is no evidence of acute infarct, intracranial hemorrhage, mass, midline shift, or extra-axial fluid collection. Mild cerebral atrophy is within normal limits for age. Vascular: Calcified atherosclerosis at the skull base. No hyperdense vessel. Skull: No fracture. 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 Sinuses/Orbits: Partially visualized mild mucosal thickening in the maxillary and ethmoid sinuses. Clear mastoid air cells. Unremarkable orbits. Other: None. IMPRESSION: No evidence of acute intracranial abnormality. Electronically Signed   By: Hannah Roy M.D.   On: 08/31/2018 16:58   Ct Angio Chest Pe W/cm &/or Wo Cm  Result Date: 08/31/2018 CLINICAL DATA:  Chest pain, shortness of breath EXAM: CT ANGIOGRAPHY CHEST WITH CONTRAST TECHNIQUE: Multidetector CT imaging of the chest was performed using the standard protocol during bolus administration of intravenous contrast. Multiplanar CT image reconstructions and MIPs were obtained to evaluate the  vascular anatomy. CONTRAST:  69 mL Isovue 350 MG/ML SOLN COMPARISON:  06/11/2018 FINDINGS: Cardiovascular: Satisfactory opacification of the pulmonary arteries to the segmental level. No evidence of pulmonary embolism. 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 Cardiomegaly. No pericardial effusion. Mediastinum/Nodes: No enlarged mediastinal, hilar, or axillary lymph nodes. Thyroid gland, trachea, and esophagus demonstrate no significant findings. Lungs/Pleura: Lungs are clear. Redemonstrated post infectious pneumatocele of the left lung base. No pleural effusion or pneumothorax. Upper Abdomen: No acute abnormality. Musculoskeletal: No chest wall abnormality. No acute or significant osseous findings. Review of the MIP images confirms the above findings. IMPRESSION: 1.  Negative examination for pulmonary embolism. 2.  Cardiomegaly. 3. 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 Electronically Signed   By: Hannah Roy M.D.   On: 08/31/2018 17:11   Dg Chest Port 1 View  Result Date: 08/31/2018 CLINICAL DATA:  78 year old female with history of chest pain and shortness of breath. Dizziness. EXAM: PORTABLE CHEST 1 VIEW COMPARISON:  Chest x-ray 05/10/2018. FINDINGS: Lung volumes are normal. No consolidative airspace disease. No pleural effusions. No suspicious appearing pulmonary nodules or masses. No pneumothorax. No evidence of pulmonary edema. Mild cardiomegaly. Upper mediastinal contours are within normal limits. Aortic atherosclerosis. IMPRESSION: 1. No radiographic evidence of acute cardiopulmonary disease. 2. Mild cardiomegaly. 3. Aortic atherosclerosis. Electronically Signed   By: Hannah Roy M.D.   On: 08/31/2018 10:05    Cardiac Studies   Cardiac catheterization 08/29/2018:  Prox LAD lesion is 40% stenosed.  Mid Cx lesion is 40% stenosed.  Hemodynamic findings  consistent with mitral valve regurgitation and mitral valve stenosis.  IMPRESSION: Hannah Roy has noncritical CAD with a high V wave and filling pressures and at least a 9 to 10 mm transmitral gradient.  The Swan-Ganz catheter was removed.  The radial sheath was removed and a TR band was placed on the right wrist to achieve patent hemostasis. The patient left lab stable condition.  She can be discharged home today as an outpatient and restart Eliquis oral anticoagulation.  We will arrange outpatient follow-up and evaluation with Dr. Roxy Manns to determine suitability for multivalve replacement.  Echocardiogram 08/29/2018:  1. The left ventricle has normal systolic function, with an ejection fraction of 55-60%. The cavity size was normal.  2. No evidence of a thrombus present in the left atrial appendage.  3. The mitral valve is rheumatic. Moderate thickening of the anterior mitral valve leaflet, with moderately decreased mobility. Moderate mitral valve stenosis.  4. Hockey stick-like motion of  the anterior mitral valve leaflet consistent with Rheumatic mitral valve disease.  5. Tricuspid valve regurgitation is moderate.  6. The aortic valve is tricuspid Mild thickening of the aortic valve Mild calcification of the aortic valve. Aortic valve regurgitation is moderate by color flow Doppler. Mild stenosis of the aortic valve.  7. Aortic valve leaflet motion mildly restricted. Aortic valve leaflets do not fully coapt, resulting in central aortic regurgitation. Aortic regurgitaiton pressure half time 397 Hannah, consistent with moderate aortic regurgitation.  8. Pulmonic valve regurgitation is mild by color flow Doppler.  Patient Profile     78 y.o. 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  Assessment & Plan    1.  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  She has moderate to severe aortic regurgitation and moderate mitral stenosis/regurgitation as well as moderate tricuspid regurgitation.  She has been evaluated by Dr. Roxy Manns with plan for surgical intervention later this week following washout of Eliquis.  2.  Persistent atrial fibrillation, off Eliquis and transitioned to heparin.  She continues on amiodarone, Lopressor and Cardizem CD.  3.  Essential hypertension, blood pressure control has been adequate.  4.  Thoracic aortic aneurysm measuring 4.4 x 4.3 cm.  5.  Nonobstructive CAD.  Continue amiodarone, Cardizem CD, Lopressor, Cozaar, and IV heparin.  Anticipate cardiothoracic surgical intervention later this week.  Follow-up CBC and BMET in a.m.  Signed, Rozann Lesches, MD   09/02/2018, 9:05 AM

## 2018-09-02 NOTE — Progress Notes (Addendum)
PROGRESS NOTE   Hannah Roy  NLZ:[id number]    DOB: 18-Nov-1940    DOA: 08/31/2018  PCP: Kathyrn Drown, MD   I have briefly reviewed patients previous medical records in [facility].  Brief Narrative:  78 year old married female of [place] desent, PMH of rheumatic heart disease, HTN, osteoporosis, has had significant medical issues since 05/10/2018, hospitalized 1/31- 2/1 for A. fib with RVR, seen in ED 5/1 for A. fib with RVR and successfully cardioverted with one 100 J shock, cardiology evaluated at A. fib clinic via telehealth on 5/12 at which time metoprolol was added to her regimen and it was felt that her valvular heart disease was driving her A. fib and hence discussed with TCTS who suggested TEE and right and left heart catheterization for possible AVR/MVR/maze consideration, TEE 5/21: LVEF 55-60%, moderate MS, trivial MR, moderate AR, mild AS and moderate TR noted, cardiac catheterization 5/21 which showed noncritical CAD, RHD with moderate MS, severe AI and normal LV function and plan was to follow-up with Dr. Roxy Manns, TCTS as outpatient for possible multivalve replacement.  She now presented to [facility] ED and transferred to [facility] on 5/23 due to severe dizziness in upright position, extreme difficulty in walking due to dizziness, dyspnea on minimal exertion, some chest discomfort with activity and anxiety.  In the ED noted to be severely orthostatic and intermittent A. fib with RVR.  Cardiology and TCTS consulted. TCTS plans surgical intervention later this week following washout of Eliquis.   Assessment & Plan:   Principal Problem:   Orthostasis Active Problems:   Essential hypertension, benign   Atrial fibrillation with RVR (HCC)   Hypothyroidism   Persistent atrial fibrillation   Rheumatic heart disease   Orthostatic hypotension   Aortic insufficiency, rheumatic   Mitral stenosis with regurgitation, rheumatic   Tricuspid regurgitation   Ascending aorta dilatation (HCC)   Acute on  chronic diastolic heart failure (HCC)   Orthostatic hypotension  On admission supine BP 105/80, HR 104 >standing 72/54, HR 120.  Severely orthostatic and symptomatic of same.  Etiology not clear.  Hydration appears borderline and not very dehydrated, no history of GI losses or febrile illness and not on diuretics PTA.  Could be due to medications i.e. Cardizem and will defer to cardiology regarding stopping this.  Low index of suspicion for neurogenic/autonomic etiology.  Physical deconditioning may contribute.  Treated with IV fluids since admission but persistently orthostatic today: Supine BP 125/72, HR 97 >standing BP 98/80, HR 126.  TSH normal.  SARS coronavirus 2 testing negative on 5/18 and 5/23.  CT head without acute findings.  CTA chest: No PE.  Added TED hoses.   Orthostatic vital signs better today: Supine BP 100/75, HR 95 >standing BP 113/77, HR 98.  DC'd IV fluids and monitor closely.  Persistent A. fib with periodic RVR  Currently on Cardizem CD 240 mg daily, metoprolol 25 mg twice daily.  Amiodarone 200 twice daily started 5/24.  Cardiology follow-up appreciated.  If patient has persistent orthostatic hypotension, Cardiology to consider discontinuing Cardizem and starting something else for rate control.  Eliquis held after last dose on 5/23 night, IV heparin initiated 5/24 in preparation for surgical intervention later this week.  Rate better controlled compared to yesterday.  Will need Maze procedure at time of valve surgery.  Rheumatic heart disease with complex multi valvular disease (mild aortic stenosis, moderate to severe aortic insufficiency, moderate mitral stenosis, moderate mitral regurgitation & moderate tricuspid regurgitation)  Status post TEE  and cardiac cath 5/21 with results as noted above.  Patient has been symptomatic and progressively worsening since 1/31 with complaints of progressive dyspnea on exertion, palpitations, dizziness with  activity.  Dr. Roxy Manns, TCTS put appreciated and plans surgical intervention (aortic valve replacement, mitral valve replacement, Maze procedure, possible tricuspid valve repair and plication or replacement of the ascending thoracic aorta) towards the end of the week after Eliquis is completely washed out of her system.  IV heparin to continue in the interim and amiodarone added.  Essential hypertension  Patient currently on Cardizem, metoprolol and losartan.    Controlled.  Thoracic aortic aneurysm  4.4 x 4.3 cm on recent CT.    As per Dr. Roxy Manns, TCTS, may consider plication or replacement of the ascending thoracic aorta at surgery.  Anxiety  Likely precipitated by ongoing health issues.  Reassured.  Hypothyroid  Clinically euthyroid.  TSH normal.  Hypokalemia  Replaced.  Magnesium normal.    DVT prophylaxis: Last dose of Eliquis 5/23 night.  Started IV heparin infusion without bolus on 5/24. Code Status: Full Family Communication: Discussed in detail with patient's daughter via phone, updated care and answered questions. Disposition: Possibly next week after complex multivalve cardiac surgery later this week and recuperating from same.   Consultants:  Cardiology Cardiothoracic surgery  Procedures:  None  Antimicrobials:  None   Subjective: Feels better.  Less dizzy on standing and ambulating to bathroom.  Asymptomatic while in bed.  ROS: Above, otherwise negative.  Objective:  Vitals:   09/01/18 1529 09/01/18 1804 09/01/18 2113 09/02/18 0629  BP: 92/69 111/82 105/89 103/75  Pulse: 79  93 87  Resp: (!) 23  (!) 36 16  Temp: 98.1 F (36.7 C)  97.8 F (36.6 C) 98 F (36.7 C)  TempSrc: Oral  Oral Oral  SpO2: 99%  97% 97%  Weight:    55.8 kg  Height:        Examination:  General exam: Pleasant elderly female, moderately built and thinly nourished, lying comfortably propped up in bed.  Oral mucosa moist Respiratory system: Clear to auscultation.  Respiratory effort normal.  Stable Cardiovascular system: S1 & S2 heard, irregularly irregular. No JVD, murmurs, rubs, gallops or clicks. No pedal edema.  Telemetry personally reviewed: Atrial flutter/atrial fibrillation with ventricular rate mostly in the 70s- low 100s, better compared to yesterday. Gastrointestinal system: Abdomen is nondistended, soft and nontender. No organomegaly or masses felt. Normal bowel sounds heard.  Stable Central nervous system: Alert and oriented. No focal neurological deficits. Extremities: Symmetric 5 x 5 power. Skin: No rashes, lesions or ulcers Psychiatry: Judgement and insight appear normal. Mood & affect appropriate.     Data Reviewed: I have personally reviewed following labs and imaging studies  CBC: Recent Labs  Lab 08/29/18 1322 08/29/18 1325 08/31/18 0915 09/01/18 0541 09/02/18 0452  WBC  --   --  5.6 4.9 5.3  HGB 10.2* 10.2* 13.8 13.0 12.3  HCT 30.0* 30.0* 40.4 38.5 36.4  MCV  --   --  92.9 92.1 92.4  PLT  --   --  239 225 696   Basic Metabolic Panel: Recent Labs  Lab 08/29/18 1322 08/29/18 1325 08/31/18 0915 09/01/18 0541 09/02/18 0452  NA 142 142 141 142 141  K 3.5 3.5 3.7 3.3* 3.8  CL  --   --  109 108 111  CO2  --   --  20* 22 22  GLUCOSE  --   --  110* 100* 104*  BUN  --   --  15 11 12   CREATININE  --   --  0.71 0.75 0.76  CALCIUM  --   --  9.0 8.8* 8.5*  MG  --   --   --  2.0  --    Cardiac Enzymes: Recent Labs  Lab 08/31/18 0915 08/31/18 1451  TROPONINI <0.03 <0.03     Recent Results (from the past 240 hour(s))  Novel Coronavirus, NAA (hospital order; send-out to ref lab)     Status: None   Collection Time: 08/26/18 11:02 AM  Result Value Ref Range Status   SARS-CoV-2, NAA NOT DETECTED NOT DETECTED Final    Comment: (NOTE) Testing was performed using the cobas(R) SARS-CoV-2 test. This test was developed and its performance characteristics determined by [employer]. This test has not been  FDA cleared or approved. This test has been authorized by FDA under an Emergency Use Authorization (EUA). This test is only authorized for the duration of time the declaration that circumstances exist justifying the authorization of the emergency use of in vitro diagnostic tests for detection of SARS-CoV-2 virus and/or diagnosis of COVID-19 infection under section 564(b)(1) of the Act, 21 U.S.C. 572IOM-3(T)(5), unless the authorization is terminated or revoked sooner. When diagnostic testing is negative, the possibility of a false negative result should be considered in the context of a patient's recent exposures and the presence of clinical signs and symptoms consistent with COVID-19. An individual without symptoms of COVID-19 and who is not shedding SARS-CoV-2 virus would expect to have  a negative (not detected) result in this assay. Performed At: [facility] [address] [place], [place] [id number] Rush Farmer MD [id number]    [place]  Final    Comment: Performed at [facility], [place] [address] [place], [place] [address]  SARS Coronavirus 2 (CEPHEID - Performed in [facility] hospital lab), Hosp Order     Status: None   Collection Time: 08/31/18  4:21 PM  Result Value Ref Range Status   SARS Coronavirus 2 NEGATIVE NEGATIVE Final    Comment: (NOTE) If result is NEGATIVE SARS-CoV-2 target nucleic acids are NOT DETECTED. The SARS-CoV-2 RNA is generally detectable in upper and lower  respiratory specimens during the acute phase of infection. The lowest  concentration of SARS-CoV-2 viral copies this assay can detect is 250  copies / mL. A negative result does not preclude SARS-CoV-2 infection  and should not be used as the sole basis for treatment or other  patient management decisions.  A negative result may occur with  improper specimen collection / handling, submission of specimen other  than nasopharyngeal swab, presence of viral  mutation(s) within the  areas targeted by this assay, and inadequate number of viral copies  (<250 copies / mL). A negative result must be combined with clinical  observations, patient history, and epidemiological information. If result is POSITIVE SARS-CoV-2 target nucleic acids are DETECTED. The SARS-CoV-2 RNA is generally detectable in upper and lower  respiratory specimens dur ing the acute phase of infection.  Positive  results are indicative of active infection with SARS-CoV-2.  Clinical  correlation with patient history and other diagnostic information is  necessary to determine patient infection status.  Positive results do  not rule out bacterial infection or co-infection with other viruses. If result is PRESUMPTIVE POSTIVE SARS-CoV-2 nucleic acids MAY BE PRESENT.   A presumptive positive result was obtained on the submitted specimen  and confirmed on repeat testing.  While 2019 novel coronavirus  (SARS-CoV-2) nucleic  acids may be present in the submitted sample  additional confirmatory testing may be necessary for epidemiological  and / or clinical management purposes  to differentiate between  SARS-CoV-2 and other Sarbecovirus currently known to infect humans.  If clinically indicated additional testing with an alternate test  methodology [phone]) is advised. The SARS-CoV-2 RNA is generally  detectable in upper and lower respiratory sp ecimens during the acute  phase of infection. The expected result is Negative. Fact Sheet for Patients:  StrictlyIdeas.no Fact Sheet for Healthcare Providers: BankingDealers.co.za This test is not yet approved or cleared by the [place] FDA and has been authorized for detection and/or diagnosis of SARS-CoV-2 by FDA under an Emergency Use Authorization (EUA).  This EUA will remain in effect (meaning this test can be used) for the duration of the COVID-19 declaration under Section 564(b)(1)  of the Act, 21 U.S.C. section 360bbb-3(b)(1), unless the authorization is terminated or revoked sooner. Performed at [facility], [address], [place],  [address]          Radiology Studies: Ct Head Wo Contrast  Result Date: 08/31/2018 CLINICAL DATA:  Ataxia, stroke suspected. Dizziness. EXAM: CT HEAD WITHOUT CONTRAST TECHNIQUE: Contiguous axial images were obtained from the base of the skull through the vertex without intravenous contrast. COMPARISON:  Brain MRI 10/30/2017 FINDINGS: Brain: There is no evidence of acute infarct, intracranial hemorrhage, mass, midline shift, or extra-axial fluid collection. Mild cerebral atrophy is within normal limits for age. Vascular: Calcified atherosclerosis at the skull base. No hyperdense vessel. Skull: No fracture. 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 Sinuses/Orbits: Partially visualized mild mucosal thickening in the maxillary and ethmoid sinuses. Clear mastoid air cells. Unremarkable orbits. Other: None. IMPRESSION: No evidence of acute intracranial abnormality. Electronically Signed   By: Logan Bores M.D.   On: 08/31/2018 16:58   Ct Angio Chest Pe W/cm &/or Wo Cm  Result Date: 08/31/2018 CLINICAL DATA:  Chest pain, shortness of breath EXAM: CT ANGIOGRAPHY CHEST WITH CONTRAST TECHNIQUE: Multidetector CT imaging of the chest was performed using the standard protocol during bolus administration of intravenous contrast. Multiplanar CT image reconstructions and MIPs were obtained to evaluate the vascular anatomy. CONTRAST:  69 mL Isovue 350 MG/ML SOLN COMPARISON:  06/11/2018 FINDINGS: Cardiovascular: Satisfactory opacification of the pulmonary arteries to the segmental level. No evidence of pulmonary embolism. 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 Cardiomegaly. No pericardial effusion. Mediastinum/Nodes: No enlarged mediastinal, hilar, or axillary lymph nodes. Thyroid gland,  trachea, and esophagus demonstrate no significant findings. Lungs/Pleura: Lungs are clear. Redemonstrated post infectious pneumatocele of the left lung base. No pleural effusion or pneumothorax. Upper Abdomen: No acute abnormality. Musculoskeletal: No chest wall abnormality. No acute or significant osseous findings. Review of the MIP images confirms the above findings. IMPRESSION: 1.  Negative examination for pulmonary embolism. 2.  Cardiomegaly. 3. 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 Electronically Signed   By: Eddie Candle M.D.   On: 08/31/2018 17:11        Scheduled Meds:  amiodarone  200 mg Oral BID PC   diltiazem  240 mg Oral Daily   losartan  25 mg Oral Daily   metoprolol tartrate  25 mg Oral BID   Continuous Infusions:  heparin 800 Units/hr (09/01/18 2300)   sodium chloride       LOS: 1 day  Vernell Leep, MD, FACP, [facility]. [facility]  To contact the attending provider between 7A-7P or the covering provider during after hours 7P-7A, please log into the web site [email or web address] and access using universal [place] password for that web site. If you do not have the password, please call the hospital operator.  09/02/2018, 10:13 AM

## 2018-09-03 ENCOUNTER — Ambulatory Visit: Payer: Self-pay | Admitting: *Deleted

## 2018-09-03 DIAGNOSIS — I052 Rheumatic mitral stenosis with insufficiency: Secondary | ICD-10-CM

## 2018-09-03 DIAGNOSIS — I4891 Unspecified atrial fibrillation: Secondary | ICD-10-CM

## 2018-09-03 LAB — CBC
HCT: 37.5 % (ref 36.0–46.0)
Hemoglobin: 12.8 g/dL (ref 12.0–15.0)
MCH: 31.4 pg (ref 26.0–34.0)
MCHC: 34.1 g/dL (ref 30.0–36.0)
MCV: 92.1 fL (ref 80.0–100.0)
Platelets: 229 10*3/uL (ref 150–400)
RBC: 4.07 MIL/uL (ref 3.87–5.11)
RDW: 12 % (ref 11.5–15.5)
WBC: 5.2 10*3/uL (ref 4.0–10.5)
nRBC: 0 % (ref 0.0–0.2)

## 2018-09-03 LAB — HEPARIN LEVEL (UNFRACTIONATED)
Heparin Unfractionated: 0.75 IU/mL — ABNORMAL HIGH (ref 0.30–0.70)
Heparin Unfractionated: 0.56 IU/mL (ref 0.30–0.70)

## 2018-09-03 LAB — APTT
aPTT: 110 seconds — ABNORMAL HIGH (ref 24–36)
aPTT: 70 seconds — ABNORMAL HIGH (ref 24–36)

## 2018-09-03 MED ORDER — ZOLPIDEM TARTRATE 5 MG PO TABS
5.0000 mg | ORAL_TABLET | Freq: Every evening | ORAL | Status: DC | PRN
  Administered 2018-09-03: 5 mg via ORAL
  Filled 2018-09-03: qty 1

## 2018-09-03 MED ORDER — DIPHENHYDRAMINE-ZINC ACETATE 2-0.1 % EX CREA
TOPICAL_CREAM | Freq: Two times a day (BID) | CUTANEOUS | Status: DC | PRN
  Administered 2018-09-03: 23:00:00 via TOPICAL
  Filled 2018-09-03 (×2): qty 28

## 2018-09-03 NOTE — Plan of Care (Signed)

## 2018-09-03 NOTE — Progress Notes (Signed)
PROGRESS NOTE   Hannah Roy  YTK:[id number]    DOB: December 31, 1940    DOA: 08/31/2018  PCP: Kathyrn Drown, MD   I have briefly reviewed patients previous medical records in [facility].  Brief Narrative:  78 year old married female of [place] desent, PMH of rheumatic heart disease, HTN, osteoporosis, has had significant medical issues since 05/10/2018, hospitalized 1/31- 2/1 for A. fib with RVR, seen in ED 5/1 for A. fib with RVR and successfully cardioverted with one 100 J shock, cardiology evaluated at A. fib clinic via telehealth on 5/12 at which time metoprolol was added to her regimen and it was felt that her valvular heart disease was driving her A. fib and hence discussed with TCTS who suggested TEE and right and left heart catheterization for possible AVR/MVR/maze consideration, TEE 5/21: LVEF 55-60%, moderate MS, trivial MR, moderate AR, mild AS and moderate TR noted, cardiac catheterization 5/21 which showed noncritical CAD, RHD with moderate MS, severe AI and normal LV function and plan was to follow-up with Dr. Roxy Manns, TCTS as outpatient for possible multivalve replacement.  She now presented to [facility] ED and transferred to [facility] on 5/23 due to severe dizziness in upright position, extreme difficulty in walking due to dizziness, dyspnea on minimal exertion, some chest discomfort with activity and anxiety.  In the ED noted to be severely orthostatic and intermittent A. fib with RVR.  Cardiology and TCTS consulted. TCTS plans surgical intervention (complex multi valvular surgery) later this week following washout of Eliquis.   Assessment & Plan:   Principal Problem:   Orthostasis Active Problems:   Essential hypertension, benign   Atrial fibrillation with RVR (HCC)   Hypothyroidism   Persistent atrial fibrillation   Rheumatic heart disease   Orthostatic hypotension   Aortic insufficiency, rheumatic   Mitral stenosis with regurgitation, rheumatic   Tricuspid regurgitation   Ascending aorta  dilatation (HCC)   Acute on chronic diastolic heart failure (HCC)   Orthostatic hypotension  On admission supine BP 105/80, HR 104 >standing 72/54, HR 120.  Severely orthostatic and symptomatic of same.  Etiology not clear.  Could have been mild dehydration although no history of GI losses or febrile illness and not on diuretics PTA.  Could be due to medications i.e. Cardizem.  Low index of suspicion for neurogenic/autonomic etiology.  Physical deconditioning may contribute.  TSH normal.  SARS coronavirus 2 testing negative on 5/18 and 5/23.  CT head without acute findings.  CTA chest: No PE.  Treated with gentle IV fluid hydration (now off) and TED hoses, symptomatically much improved and blood pressures 5/25 negative for orthostatic changes.  No orthostatic blood pressures done today.  Persistent A. fib with periodic RVR  Currently on Cardizem CD 240 mg daily, metoprolol 25 mg twice daily.  Amiodarone 200 twice daily started 5/24.  Cardiology follow-up appreciated.  If patient has persistent orthostatic hypotension, Cardiology to consider discontinuing Cardizem and starting something else for rate control.  Eliquis held after last dose on 5/23 night, IV heparin initiated 5/24 in preparation for surgical intervention later this week.  Now rate controlled.  Will need Maze procedure at time of valve surgery.  Rheumatic heart disease with complex multi valvular disease (mild aortic stenosis, moderate to severe aortic insufficiency, moderate mitral stenosis, moderate mitral regurgitation & moderate tricuspid regurgitation)  Status post TEE and cardiac cath 5/21 with results as noted above.  Patient has been symptomatic and progressively worsening since 1/31 with complaints of progressive dyspnea on exertion, palpitations,  dizziness with activity.  Dr. Roxy Manns, TCTS put appreciated and plans surgical intervention (aortic valve replacement, mitral valve replacement, Maze procedure,  possible tricuspid valve repair and plication or replacement of the ascending thoracic aorta) towards the end of the week after Eliquis is completely washed out of her system.  IV heparin to continue in the interim and amiodarone added.  As per patient, surgery supposed to be on 5/28.  Essential hypertension  Patient currently on Cardizem, metoprolol and losartan.    Controlled.  Thoracic aortic aneurysm  4.4 x 4.3 cm on recent CT.    As per Dr. Roxy Manns, TCTS, may consider plication or replacement of the ascending thoracic aorta at surgery.  Anxiety  Likely precipitated by ongoing health issues.  Reassured.  Hypothyroid  Clinically euthyroid.  TSH normal.  Hypokalemia  Replaced.  Magnesium normal.  Insomnia  States that this is been ongoing at home for the last 3 to 4 weeks.  OTC melatonin 5 mg at bedtime reportedly did not help.  Received a dose of Ativan 0.5 mg at bedtime last night.  Willing to try Ambien 5 mg p.o. nightly as needed.    DVT prophylaxis: Last dose of Eliquis 5/23 night.  Started IV heparin infusion without bolus on 5/24. Code Status: Full Family Communication: Discussed in detail with patient's daughter via phone today, updated care and answered questions. Disposition: Possibly next week after complex multivalve cardiac surgery later this week and recuperating from same.   Consultants:  Cardiology Cardiothoracic surgery  Procedures:  None  Antimicrobials:  None   Subjective: Overall feels much better.  Dizziness significantly improved since admission and minimal, intermittent with ambulation.  No dyspnea or chest pain.  Feels stronger.  Had a BM on 5/24.  Having difficulty sleeping at night.  As per RN, no acute issues reported.  ROS: Above, otherwise negative.  Objective:  Vitals:   09/02/18 0629 09/02/18 1429 09/02/18 2101 09/03/18 0449  BP: 103/75 108/76 124/74 116/68  Pulse: 87 69 76 70  Resp: 16 16 15 14   Temp: 98 F (36.7 C)  98 F (36.7 C) 98.4 F (36.9 C) 97.6 F (36.4 C)  TempSrc: Oral Oral Oral Oral  SpO2: 97% 95% 97% 97%  Weight: 55.8 kg   55.2 kg  Height:        Examination:  General exam: Pleasant elderly female, moderately built and thinly nourished, sitting up comfortably in bed without distress. Respiratory system: Clear to auscultation.  No increased work of breathing. Cardiovascular system: S1 & S2 heard, irregularly irregular. No JVD, murmurs, rubs, gallops or clicks. No pedal edema.  Telemetry personally reviewed: Atrial flutter/atrial fibrillation with controlled ventricular rate. Gastrointestinal system: Abdomen is nondistended, soft and nontender. No organomegaly or masses felt. Normal bowel sounds heard.  Stable. Central nervous system: Alert and oriented. No focal neurological deficits. Extremities: Symmetric 5 x 5 power. Skin: No rashes, lesions or ulcers Psychiatry: Judgement and insight appear normal. Mood & affect appropriate.     Data Reviewed: I have personally reviewed following labs and imaging studies  CBC: Recent Labs  Lab 08/29/18 1325 08/31/18 0915 09/01/18 0541 09/02/18 0452 09/03/18 0443  WBC  --  5.6 4.9 5.3 5.2  HGB 10.2* 13.8 13.0 12.3 12.8  HCT 30.0* 40.4 38.5 36.4 37.5  MCV  --  92.9 92.1 92.4 92.1  PLT  --  239 225 228 751   Basic Metabolic Panel: Recent Labs  Lab 08/29/18 1322 08/29/18 1325 08/31/18 0915 09/01/18 0541 09/02/18 7001  NA 142 142 141 142 141  K 3.5 3.5 3.7 3.3* 3.8  CL  --   --  109 108 111  CO2  --   --  20* 22 22  GLUCOSE  --   --  110* 100* 104*  BUN  --   --  15 11 12   CREATININE  --   --  0.71 0.75 0.76  CALCIUM  --   --  9.0 8.8* 8.5*  MG  --   --   --  2.0  --    Cardiac Enzymes: Recent Labs  Lab 08/31/18 0915 08/31/18 1451  TROPONINI <0.03 <0.03     Recent Results (from the past 240 hour(s))  Novel Coronavirus, NAA (hospital order; send-out to ref lab)     Status: None   Collection Time: 08/26/18 11:02 AM   Result Value Ref Range Status   SARS-CoV-2, NAA NOT DETECTED NOT DETECTED Final    Comment: (NOTE) Testing was performed using the cobas(R) SARS-CoV-2 test. This test was developed and its performance characteristics determined by [employer]. This test has not been FDA cleared or approved. This test has been authorized by FDA under an Emergency Use Authorization (EUA). This test is only authorized for the duration of time the declaration that circumstances exist justifying the authorization of the emergency use of in vitro diagnostic tests for detection of SARS-CoV-2 virus and/or diagnosis of COVID-19 infection under section 564(b)(1) of the Act, 21 U.S.C. 283TDV-7(O)(1), unless the authorization is terminated or revoked sooner. When diagnostic testing is negative, the possibility of a false negative result should be considered in the context of a patient's recent exposures and the presence of clinical signs and symptoms consistent with COVID-19. An individual without symptoms of COVID-19 and who is not shedding SARS-CoV-2 virus would expect to have  a negative (not detected) result in this assay. Performed At: [facility] [address] [place], [place] [id number] Rush Farmer MD IR:[id number]    [place]  Final    Comment: Performed at [facility], [place] [address], [place], [place] [address]  SARS Coronavirus 2 (CEPHEID - Performed in [place] hospital lab), Hosp Order     Status: None   Collection Time: 08/31/18  4:21 PM  Result Value Ref Range Status   SARS Coronavirus 2 NEGATIVE NEGATIVE Final    Comment: (NOTE) If result is NEGATIVE SARS-CoV-2 target nucleic acids are NOT DETECTED. The SARS-CoV-2 RNA is generally detectable in upper and lower  respiratory specimens during the acute phase of infection. The lowest  concentration of SARS-CoV-2 viral copies this assay can detect is 250  copies / mL. A negative result does  not preclude SARS-CoV-2 infection  and should not be used as the sole basis for treatment or other  patient management decisions.  A negative result may occur with  improper specimen collection / handling, submission of specimen other  than nasopharyngeal swab, presence of viral mutation(s) within the  areas targeted by this assay, and inadequate number of viral copies  (<250 copies / mL). A negative result must be combined with clinical  observations, patient history, and epidemiological information. If result is POSITIVE SARS-CoV-2 target nucleic acids are DETECTED. The SARS-CoV-2 RNA is generally detectable in upper and lower  respiratory specimens dur ing the acute phase of infection.  Positive  results are indicative of active infection with SARS-CoV-2.  Clinical  correlation with patient history and other diagnostic information is  necessary to determine patient  infection status.  Positive results do  not rule out bacterial infection or co-infection with other viruses. If result is PRESUMPTIVE POSTIVE SARS-CoV-2 nucleic acids MAY BE PRESENT.   A presumptive positive result was obtained on the submitted specimen  and confirmed on repeat testing.  While 2019 novel coronavirus  (SARS-CoV-2) nucleic acids may be present in the submitted sample  additional confirmatory testing may be necessary for epidemiological  and / or clinical management purposes  to differentiate between  SARS-CoV-2 and other Sarbecovirus currently known to infect humans.  If clinically indicated additional testing with an alternate test  methodology [phone]) is advised. The SARS-CoV-2 RNA is generally  detectable in upper and lower respiratory sp ecimens during the acute  phase of infection. The expected result is Negative. Fact Sheet for Patients:  StrictlyIdeas.no Fact Sheet for Healthcare Providers: BankingDealers.co.za This test is not yet approved or  cleared by the [place] FDA and has been authorized for detection and/or diagnosis of SARS-CoV-2 by FDA under an Emergency Use Authorization (EUA).  This EUA will remain in effect (meaning this test can be used) for the duration of the COVID-19 declaration under Section 564(b)(1) of the Act, 21 U.S.C. section 360bbb-3(b)(1), unless the authorization is terminated or revoked sooner. Performed at [facility], [address] [place], [place] [address]          Radiology Studies: No results found.      Scheduled Meds: . amiodarone  200 mg Oral BID PC  . diltiazem  240 mg Oral Daily  . losartan  25 mg Oral Daily  . metoprolol tartrate  25 mg Oral BID   Continuous Infusions: . heparin 750 Units/hr (09/03/18 0805)     LOS: 2 days     Vernell Leep, MD, FACP, [facility]. [facility]  To contact the attending provider between 7A-7P or the covering provider during after hours 7P-7A, please log into the web site [email or web address] and access using universal [place] password for that web site. If you do not have the password, please call the hospital operator.  09/03/2018, 9:43 AM

## 2018-09-03 NOTE — Progress Notes (Signed)
ANTICOAGULATION CONSULT NOTE - [place] for Heparin Indication: atrial fibrillation  Allergies  Allergen Reactions  . Bee Venom Anaphylaxis  . Boniva [Ibandronic Acid]     Upset stomach, flu like symptoms   . Lisinopril Cough  . Latex Rash    Patient Measurements: Height: 5\' 1"  (154.9 cm) Weight: 121 lb 11.2 oz (55.2 kg) IBW/kg (Calculated) : 47.8 Heparin Dosing Weight: 55.5 kg  Vital Signs: Temp: 97.6 F (36.4 C) (05/26 0449) Temp Source: Oral (05/26 0449) BP: 116/68 (05/26 0449) Pulse Rate: 70 (05/26 0449)  Labs: Recent Labs    08/31/18 0915 08/31/18 1451 09/01/18 0541 09/01/18 1807 09/02/18 0452 09/03/18 0443  HGB 13.8  --  13.0  --  12.3 12.8  HCT 40.4  --  38.5  --  36.4 37.5  PLT 239  --  225  --  228 229  APTT  --   --   --  68* 93* 110*  LABPROT  --   --   --   --  14.9  --   INR  --   --   --   --  1.2  --   HEPARINUNFRC  --   --   --  1.58* 1.05* 0.75*  CREATININE 0.71  --  0.75  --  0.76  --   TROPONINI <0.03 <0.03  --   --   --   --     Estimated Creatinine Clearance: 45.8 mL/min (by C-G formula based on SCr of 0.76 mg/dL).   Medical History: Past Medical History:  Diagnosis Date  . Aortic atherosclerosis ([place])   . Aortic insufficiency, rheumatic   . 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 Atrial fibrillation ([place]) 05/10/2018  . Colon polyps   . Essential hypertension, benign   . History of seasonal allergies   . Hypertension   . Hypothyroidism   . Mitral stenosis with regurgitation, rheumatic   . Osteopenia 2006  . Persistent atrial fibrillation   . Tricuspid regurgitation     Assessment: Hannah Roy with hx AFib on Eliquis PTA admitted with symptomatic MV stenosis. Pharmacy asked to transition to IV heparin while holding Eliquis in anticipation of surgery. Initial aPTT therapeutic, heparin level falsely high due to DOAC.  Heparin level came back slightly elevated at 0.75, aPTT came back supratherapeutic at 110.  Hgb 12.8, plt 229. No s/sx of bleeding.   Goal of Therapy:  Heparin level 0.3-0.7 units/ml aPTT 66-102 seconds Monitor platelets by anticoagulation protocol: Yes   Plan:  Reduce IV heparin to 750 units/hr. Daily aPTT and heparin level until correlating  Thank you for involving pharmacy in this patient's care.  Antonietta Jewel, PharmD, BCCCP Clinical Pharmacist  Pager: [phone] Phone: [phone] 09/03/2018 5:54 AM

## 2018-09-03 NOTE — Telephone Encounter (Signed)
Pt is currently admitted

## 2018-09-03 NOTE — Progress Notes (Signed)
Progress Note  Patient Name: Hannah Roy Date of Encounter: 09/03/2018  Primary Cardiologist: Kate Sable, MD  Subjective   No complaints palpitations from afib better   Inpatient Medications    Scheduled Meds: . amiodarone  200 mg Oral BID PC  . diltiazem  240 mg Oral Daily  . losartan  25 mg Oral Daily  . metoprolol tartrate  25 mg Oral BID   Continuous Infusions: . heparin 750 Units/hr (09/03/18 0805)   PRN Meds: ondansetron **OR** ondansetron (ZOFRAN) IV   Vital Signs    Vitals:   09/02/18 0629 09/02/18 1429 09/02/18 2101 09/03/18 0449  BP: 103/75 108/76 124/74 116/68  Pulse: 87 69 76 70  Resp: 16 16 15 14   Temp: 98 F (36.7 C) 98 F (36.7 C) 98.4 F (36.9 C) 97.6 F (36.4 C)  TempSrc: Oral Oral Oral Oral  SpO2: 97% 95% 97% 97%  Weight: 55.8 kg   55.2 kg  Height:        Intake/Output Summary (Last 24 hours) at 09/03/2018 0945 Last data filed at 09/03/2018 0300 Gross per 24 hour  Intake 601.94 ml  Output -  Net 601.94 ml   Filed Weights   08/31/18 2145 09/02/18 0629 09/03/18 0449  Weight: 55.5 kg 55.8 kg 55.2 kg    Telemetry    Atrial fibrillation.  Personally reviewed.  ECG    Tracing from 08/31/2018 shows atrial fibrillation with LVH and repolarization abnormalities.  Personally reviewed.  Physical Exam   Affect appropriate Elderly [place] female  HEENT: normal Neck supple with no adenopathy JVP normal no bruits no thyromegaly Lungs clear with no wheezing and good diaphragmatic motion Heart:  S1/S2  SEM murmur, no rub, gallop or click PMI normal Abdomen: benighn, BS positve, no tenderness, no AAA no bruit.  No HSM or HJR Distal pulses intact with no bruits No edema Neuro non-focal Skin warm and dry No muscular weakness   Labs    Chemistry Recent Labs  Lab 08/31/18 0915 09/01/18 0541 09/02/18 0452  NA 141 142 141  K 3.7 3.3* 3.8  CL 109 108 111  CO2 20* 22 22  GLUCOSE 110* 100* 104*  BUN 15 11 12   CREATININE 0.71  0.75 0.76  CALCIUM 9.0 8.8* 8.5*  PROT  --   --  6.3*  ALBUMIN  --   --  3.5  AST  --   --  15  ALT  --   --  20  ALKPHOS  --   --  64  BILITOT  --   --  1.0  GFRNONAA >60 >60 >60  GFRAA >60 >60 >60  ANIONGAP 12 12 8      Hematology Recent Labs  Lab 09/01/18 0541 09/02/18 0452 09/03/18 0443  WBC 4.9 5.3 5.2  RBC 4.18 3.94 4.07  HGB 13.0 12.3 12.8  HCT 38.5 36.4 37.5  MCV 92.1 92.4 92.1  MCH 31.1 31.2 31.4  MCHC 33.8 33.8 34.1  RDW 12.1 12.1 12.0  PLT 225 228 229    Cardiac Enzymes Recent Labs  Lab 08/31/18 0915 08/31/18 1451  TROPONINI <0.03 <0.03   No results for input(s): TROPIPOC in the last 168 hours.   BNP Recent Labs  Lab 08/31/18 1451  BNP 549.0*     Radiology    No results found.  Cardiac Studies   Cardiac catheterization 08/29/2018:  Prox LAD lesion is 40% stenosed.  Mid Cx lesion is 40% stenosed.  Hemodynamic findings consistent with mitral valve regurgitation  and mitral valve stenosis.  IMPRESSION: [name] has noncritical CAD with a high V wave and filling pressures and at least a 9 to 10 mm transmitral gradient.  The Swan-Ganz catheter was removed.  The radial sheath was removed and a TR band was placed on the right wrist to achieve patent hemostasis. The patient left lab stable condition.  She can be discharged home today as an outpatient and restart Eliquis oral anticoagulation.  We will arrange outpatient follow-up and evaluation with Dr. Roxy Manns to determine suitability for multivalve replacement.  Echocardiogram 08/29/2018:  1. The left ventricle has normal systolic function, with an ejection fraction of 55-60%. The cavity size was normal.  2. No evidence of a thrombus present in the left atrial appendage.  3. The mitral valve is rheumatic. Moderate thickening of the anterior mitral valve leaflet, with moderately decreased mobility. Moderate mitral valve stenosis.  4. Hockey stick-like motion of the anterior mitral valve leaflet consistent  with Rheumatic mitral valve disease.  5. Tricuspid valve regurgitation is moderate.  6. The aortic valve is tricuspid Mild thickening of the aortic valve Mild calcification of the aortic valve. Aortic valve regurgitation is moderate by color flow Doppler. Mild stenosis of the aortic valve.  7. Aortic valve leaflet motion mildly restricted. Aortic valve leaflets do not fully coapt, resulting in central aortic regurgitation. Aortic regurgitaiton pressure half time 397 ms, consistent with moderate aortic regurgitation.  8. Pulmonic valve regurgitation is mild by color flow Doppler.  Patient Profile     78 y.o. 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  Assessment & Plan    1.  Rheumatic heart disease including aortic and mitral valves and associated with dilated aortic root.  She has moderate to severe aortic regurgitation and moderate mitral stenosis/regurgitation as well as moderate tricuspid regurgitation.  She has been evaluated by Dr. Roxy Manns with plan for surgical intervention later this week following washout of Eliquis.Planned for Thursday Big operation including AVR/MVR ? TV ring and MAZE procedure   2.  Persistent atrial fibrillation, off Eliquis and transitioned to heparin.  She continues on amiodarone, Lopressor and Cardizem CD.rates in 70 bpm  3.  Essential hypertension, blood pressure control has been adequate.  4.  Thoracic aortic aneurysm measuring 4.4 x 4.3 cm.  5.  Nonobstructive CAD.   Signed, Jenkins Rouge, MD   09/03/2018, 9:45 AM

## 2018-09-03 NOTE — Progress Notes (Signed)
ANTICOAGULATION CONSULT NOTE - [place] for Heparin Indication: atrial fibrillation  Allergies  Allergen Reactions  . Bee Venom Anaphylaxis  . Boniva [Ibandronic Acid]     Upset stomach, flu like symptoms   . Lisinopril Cough  . Latex Rash    Patient Measurements: Height: 5\' 1"  (154.9 cm) Weight: 121 lb 11.2 oz (55.2 kg) IBW/kg (Calculated) : 47.8 Heparin Dosing Weight: 55.5 kg  Vital Signs: Temp: 97.6 F (36.4 C) (05/26 0449) Temp Source: Oral (05/26 0449) BP: 116/68 (05/26 0449) Pulse Rate: 70 (05/26 0449)  Labs: Recent Labs    08/31/18 1451  09/01/18 0541  09/02/18 0452 09/03/18 0443 09/03/18 1403  HGB  --    < > 13.0  --  12.3 12.8  --   HCT  --   --  38.5  --  36.4 37.5  --   PLT  --   --  225  --  228 229  --   APTT  --   --   --    < > 93* 110* 70*  LABPROT  --   --   --   --  14.9  --   --   INR  --   --   --   --  1.2  --   --   HEPARINUNFRC  --   --   --    < > 1.05* 0.75* 0.56  CREATININE  --   --  0.75  --  0.76  --   --   TROPONINI <0.03  --   --   --   --   --   --    < > = values in this interval not displayed.    Estimated Creatinine Clearance: 45.8 mL/min (by C-G formula based on SCr of 0.76 mg/dL).   Assessment: 58 yoF with hx AFib on Eliquis PTA admitted with symptomatic MV stenosis. Pharmacy asked to transition to IV heparin while holding Eliquis in anticipation of surgery. Initial aPTT therapeutic, heparin level falsely high due to DOAC.  Heparin level is therapeutic at 0.56, aPTT is also therapeutic at 70 sec. No s/sx of bleeding, CBC stable. Will adjust heparin drip based on heparin levels now that aPTT and heparin level are correlating.  Goal of Therapy:  Heparin level 0.3-0.7 units/ml aPTT 66-102 seconds Monitor platelets by anticoagulation protocol: Yes   Plan:  Continue IV heparin at 750 units/hr Daily heparin level and CBC Monitor for s/sx of bleeding   Thank you for involving pharmacy in this  patient's care.  Renold Genta, PharmD, BCPS Clinical Pharmacist Clinical phone for 09/03/2018 until 3p is [phone] 09/03/2018 2:50 PM  **Pharmacist phone directory can now be found on [email or web address] listed under [place]**

## 2018-09-03 NOTE — Progress Notes (Signed)
TCTS BRIEF PROGRESS NOTE   For OR on Thursday  Rexene Alberts, MD 09/03/2018 5:54 PM

## 2018-09-04 ENCOUNTER — Encounter (HOSPITAL_COMMUNITY): Payer: Self-pay | Admitting: Cardiovascular Disease

## 2018-09-04 ENCOUNTER — Inpatient Hospital Stay (HOSPITAL_COMMUNITY): Payer: Medicare Other

## 2018-09-04 DIAGNOSIS — I340 Nonrheumatic mitral (valve) insufficiency: Secondary | ICD-10-CM

## 2018-09-04 DIAGNOSIS — I342 Nonrheumatic mitral (valve) stenosis: Secondary | ICD-10-CM

## 2018-09-04 DIAGNOSIS — I361 Nonrheumatic tricuspid (valve) insufficiency: Secondary | ICD-10-CM

## 2018-09-04 DIAGNOSIS — I351 Nonrheumatic aortic (valve) insufficiency: Secondary | ICD-10-CM

## 2018-09-04 DIAGNOSIS — Z01810 Encounter for preprocedural cardiovascular examination: Secondary | ICD-10-CM

## 2018-09-04 LAB — URINALYSIS, COMPLETE (UACMP) WITH MICROSCOPIC
Bacteria, UA: NONE SEEN
Bilirubin Urine: NEGATIVE
Glucose, UA: NEGATIVE mg/dL
Ketones, ur: NEGATIVE mg/dL
Nitrite: NEGATIVE
Protein, ur: NEGATIVE mg/dL
Specific Gravity, Urine: 1.014 (ref 1.005–1.030)
pH: 6 (ref 5.0–8.0)

## 2018-09-04 LAB — BASIC METABOLIC PANEL
Anion gap: 10 (ref 5–15)
BUN: 18 mg/dL (ref 8–23)
CO2: 22 mmol/L (ref 22–32)
Calcium: 9.3 mg/dL (ref 8.9–10.3)
Chloride: 108 mmol/L (ref 98–111)
Creatinine, Ser: 0.91 mg/dL (ref 0.44–1.00)
GFR calc Af Amer: >60 mL/min (ref >60)
GFR calc non Af Amer: >60 mL/min (ref >60)
Glucose, Bld: 98 mg/dL (ref 70–99)
Potassium: 3.7 mmol/L (ref 3.5–5.1)
Sodium: 140 mmol/L (ref 135–145)

## 2018-09-04 LAB — CBC
HCT: 36.7 % (ref 36.0–46.0)
Hemoglobin: 12.4 g/dL (ref 12.0–15.0)
MCH: 31.2 pg (ref 26.0–34.0)
MCHC: 33.8 g/dL (ref 30.0–36.0)
MCV: 92.2 fL (ref 80.0–100.0)
Platelets: 232 10*3/uL (ref 150–400)
RBC: 3.98 MIL/uL (ref 3.87–5.11)
RDW: 12 % (ref 11.5–15.5)
WBC: 5.2 10*3/uL (ref 4.0–10.5)
nRBC: 0 % (ref 0.0–0.2)

## 2018-09-04 LAB — SURGICAL PCR SCREEN
MRSA, PCR: NEGATIVE
Staphylococcus aureus: NEGATIVE

## 2018-09-04 LAB — ABO/RH: ABO/RH(D): O POS

## 2018-09-04 LAB — HEPARIN LEVEL (UNFRACTIONATED): Heparin Unfractionated: 0.54 IU/mL (ref 0.30–0.70)

## 2018-09-04 MED ORDER — KENNESTONE BLOOD CARDIOPLEGIA (KBC) MANNITOL SYRINGE (20%, 32ML)
32.0000 mL | INTRAVENOUS | Status: DC
  Filled 2018-09-04: qty 32

## 2018-09-04 MED ORDER — BISACODYL 5 MG PO TBEC
5.0000 mg | DELAYED_RELEASE_TABLET | Freq: Once | ORAL | Status: AC
  Administered 2018-09-04: 5 mg via ORAL
  Filled 2018-09-04: qty 1

## 2018-09-04 MED ORDER — SODIUM CHLORIDE 0.9 % IV SOLN
INTRAVENOUS | Status: DC
  Filled 2018-09-04: qty 30

## 2018-09-04 MED ORDER — DEXMEDETOMIDINE HCL IN NACL 400 MCG/100ML IV SOLN
0.1000 ug/kg/h | INTRAVENOUS | Status: DC
  Filled 2018-09-04: qty 100

## 2018-09-04 MED ORDER — DOPAMINE-DEXTROSE 3.2-5 MG/ML-% IV SOLN
0.0000 ug/kg/min | INTRAVENOUS | Status: AC
  Administered 2018-09-05: 5 ug/kg/min via INTRAVENOUS
  Filled 2018-09-04: qty 250

## 2018-09-04 MED ORDER — PLASMA-LYTE 148 IV SOLN
INTRAVENOUS | Status: AC
  Administered 2018-09-05: 08:00:00
  Filled 2018-09-04: qty 2.5

## 2018-09-04 MED ORDER — INSULIN REGULAR(HUMAN) IN NACL 100-0.9 UT/100ML-% IV SOLN
INTRAVENOUS | Status: DC
  Filled 2018-09-04: qty 100

## 2018-09-04 MED ORDER — MILRINONE LACTATE IN DEXTROSE 20-5 MG/100ML-% IV SOLN
0.3000 ug/kg/min | INTRAVENOUS | Status: DC
  Filled 2018-09-04: qty 100

## 2018-09-04 MED ORDER — POTASSIUM CHLORIDE 2 MEQ/ML IV SOLN
80.0000 meq | INTRAVENOUS | Status: DC
  Filled 2018-09-04: qty 40

## 2018-09-04 MED ORDER — VANCOMYCIN HCL 10 G IV SOLR
1250.0000 mg | INTRAVENOUS | Status: AC
  Administered 2018-09-05: 1250 mg via INTRAVENOUS
  Filled 2018-09-04: qty 1250

## 2018-09-04 MED ORDER — METOPROLOL TARTRATE 12.5 MG HALF TABLET
12.5000 mg | ORAL_TABLET | Freq: Once | ORAL | Status: AC
  Administered 2018-09-05: 12.5 mg via ORAL
  Filled 2018-09-04: qty 1

## 2018-09-04 MED ORDER — EPINEPHRINE PF 1 MG/ML IJ SOLN
0.0000 ug/min | INTRAVENOUS | Status: DC
  Filled 2018-09-04: qty 4

## 2018-09-04 MED ORDER — TRANEXAMIC ACID (OHS) PUMP PRIME SOLUTION
2.0000 mg/kg | INTRAVENOUS | Status: DC
  Filled 2018-09-04: qty 1.09

## 2018-09-04 MED ORDER — PHENYLEPHRINE HCL-NACL 10-0.9 MG/250ML-% IV SOLN
INTRAVENOUS | Status: AC
  Filled 2018-09-04: qty 250

## 2018-09-04 MED ORDER — KENNESTONE BLOOD CARDIOPLEGIA VIAL
13.0000 mL | Status: DC
  Filled 2018-09-04: qty 13

## 2018-09-04 MED ORDER — SODIUM CHLORIDE 0.9 % IV SOLN
750.0000 mg | INTRAVENOUS | Status: DC
  Filled 2018-09-04: qty 750

## 2018-09-04 MED ORDER — TEMAZEPAM 7.5 MG PO CAPS: 15.0000 mg | ORAL_CAPSULE | Freq: Once | ORAL | Status: DC | PRN

## 2018-09-04 MED ORDER — SODIUM CHLORIDE 0.9 % IV SOLN
1.5000 g | INTRAVENOUS | Status: DC
  Filled 2018-09-04: qty 1.5

## 2018-09-04 MED ORDER — PHENYLEPHRINE HCL-NACL 20-0.9 MG/250ML-% IV SOLN
30.0000 ug/min | INTRAVENOUS | Status: DC
  Filled 2018-09-04: qty 250

## 2018-09-04 MED ORDER — VANCOMYCIN HCL 1000 MG IV SOLR
INTRAVENOUS | Status: AC
  Administered 2018-09-05: 08:00:00
  Filled 2018-09-04: qty 1000

## 2018-09-04 MED ORDER — MAGNESIUM SULFATE 50 % IJ SOLN
40.0000 meq | INTRAMUSCULAR | Status: DC
  Filled 2018-09-04: qty 9.85

## 2018-09-04 MED ORDER — NITROGLYCERIN IN D5W 200-5 MCG/ML-% IV SOLN
2.0000 ug/min | INTRAVENOUS | Status: DC
  Filled 2018-09-04: qty 250

## 2018-09-04 MED ORDER — NOREPINEPHRINE 4 MG/250ML-% IV SOLN
0.0000 ug/min | INTRAVENOUS | Status: DC
  Filled 2018-09-04: qty 250

## 2018-09-04 MED ORDER — TRANEXAMIC ACID 1000 MG/10ML IV SOLN
1.5000 mg/kg/h | INTRAVENOUS | Status: AC
  Administered 2018-09-05: 1.5 mg/kg/h via INTRAVENOUS
  Filled 2018-09-04: qty 25

## 2018-09-04 MED ORDER — TRANEXAMIC ACID (OHS) BOLUS VIA INFUSION
15.0000 mg/kg | INTRAVENOUS | Status: AC
  Administered 2018-09-05: 817.5 mg via INTRAVENOUS
  Filled 2018-09-04: qty 818

## 2018-09-04 MED ORDER — CHLORHEXIDINE GLUCONATE 4 % EX LIQD
60.0000 mL | Freq: Once | CUTANEOUS | Status: AC
  Administered 2018-09-04: 4 via TOPICAL
  Filled 2018-09-04: qty 60

## 2018-09-04 MED ORDER — CHLORHEXIDINE GLUCONATE 4 % EX LIQD
60.0000 mL | Freq: Once | CUTANEOUS | Status: AC
  Administered 2018-09-05: 4 via TOPICAL
  Filled 2018-09-04: qty 60

## 2018-09-04 MED ORDER — CHLORHEXIDINE GLUCONATE 0.12 % MT SOLN
15.0000 mL | Freq: Once | OROMUCOSAL | Status: AC
  Administered 2018-09-05: 15 mL via OROMUCOSAL
  Filled 2018-09-04: qty 15

## 2018-09-04 NOTE — Plan of Care (Signed)
  Problem: Education: Goal: Knowledge of General Education information will improve Description Including pain rating scale, medication(s)/side effects and non-pharmacologic comfort measures Outcome: Progressing   Problem: Health Behavior/Discharge Planning: Goal: Ability to manage health-related needs will improve Outcome: Progressing   Problem: Clinical Measurements: Goal: Ability to maintain clinical measurements within normal limits will improve Outcome: Progressing Goal: Will remain free from infection Outcome: Progressing Goal: Respiratory complications will improve Outcome: Progressing   Problem: Activity: Goal: Risk for activity intolerance will decrease Outcome: Progressing   Problem: Coping: Goal: Level of anxiety will decrease Outcome: Progressing   Problem: Skin Integrity: Goal: Risk for impaired skin integrity will decrease Outcome: Progressing   Problem: Nutrition: Goal: Adequate nutrition will be maintained Outcome: Completed/Met   Problem: Elimination: Goal: Will not experience complications related to bowel motility Outcome: Completed/Met Goal: Will not experience complications related to urinary retention Outcome: Completed/Met

## 2018-09-04 NOTE — Progress Notes (Signed)
CARDIAC REHAB PHASE I   PRE:  Rate/Rhythm: atrial fibrillation 80   BP:  Supine: 108/64   SaO2: 98% room air  MODE:  Ambulation: 200 ft   POST:  Rate/Rhythem: Atrial fib 108  BP:    Sitting: 109/96     SaO2: 98% Room Air 0950-1030  Patient ambulated in hallway with assistance times one wearing mask per recommended guidelines. Upon return to room washed hands for 20 seconds and used hand sanitizer. Complained of shortness of breath and stopped times one to rest. Assisted back to bed with call light within reach. Patient shown how to use incentive spirometer. Reviewed open heart surgery booklet with patient reviewed pre open heart surgery information. Reviewed mobility and sternal precautions.   Harrell Gave RN BSN

## 2018-09-04 NOTE — Progress Notes (Signed)
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  She presented for severe dizziness to [facility] (lives in [place]) and found to be orthostatic and in intermittent A-fib with RVR.  She was planning to have an outpt Multivalve replacement by Dr Roxy Manns, as it is felt that it is driving her A-fib, but after being admitted this time, it was decided it should be done during this admission.    Subjective: She has no complaints at rest. When she walks, she feels weak and a little short of breath.     Assessment & Plan:   Principal Problem:   Orthostatic hypotension - etiology was not fully clear- she as slowly hydrated and TEDS were placed- no longer orthostatic after 5/25.   Active Problems:     Atrial fibrillation with RVR  - Amiodarone added on 5/24 to Cardizem and Metoprlol - Eliquis switched to Heparin infusion - rate controlled - will be having a MAZE procedure during valve surgery    Hypothyroidism   Persistent atrial fibrillation    Suspected Rheumatic heart disease with :   Aortic stenosis, insufficiency,   Mitral stenosis with regurgitation, Tricuspid regurgitation - plan for AVR, MVR and TVR tomorrow    Ascending aorta dilatation  - plan for repair tomorrow   Insomnia - PRN Ambien trial   Time spent in minutes: 35 min DVT prophylaxis: Heparin infusion Code Status: Full code Family Communication:  Disposition Plan:  Plan for surgery tomorrow Consultants:   Cardiology  CT surgery Procedures:   none Antimicrobials:  Anti-infectives (From admission, onward)   None       Objective: Vitals:   09/02/18 2101 09/03/18 0449 09/03/18 2024 09/04/18 0538  BP: 124/74 116/68 104/67 116/76  Pulse:  76 70 73 75  Resp: 15 14 18 20   Temp: 98.4 F (36.9 C) 97.6 F (36.4 C) 98.2 F (36.8 C) (!) 97.4 F (36.3 C)  TempSrc: Oral Oral Oral Oral  SpO2: 97% 97% 97% 97%  Weight:  55.2 kg  54.5 kg  Height:       No intake or output data in the 24 hours ending 09/04/18 1055 Filed Weights   09/02/18 0629 09/03/18 0449 09/04/18 0538  Weight: 55.8 kg 55.2 kg 54.5 kg    Examination: General exam: Appears comfortable  HEENT: PERRLA, oral mucosa moist, no sclera icterus or thrush Respiratory system: Clear to auscultation. Respiratory effort normal. Cardiovascular system: S1 & S2 heard, IIRR.   Gastrointestinal system: Abdomen soft, non-tender, nondistended. Normal bowel sounds. Central nervous system: Alert and oriented. No focal neurological deficits. Extremities: No cyanosis, clubbing or edema Skin: No rashes or ulcers Psychiatry:  Mood & affect appropriate.     Data Reviewed: I have personally reviewed following labs and imaging studies  CBC: Recent Labs  Lab 08/31/18 0915 09/01/18 0541 09/02/18 0452 09/03/18 0443 09/04/18 0327  WBC 5.6 4.9 5.3 5.2 5.2  HGB 13.8 13.0 12.3 12.8 12.4  HCT 40.4 38.5 36.4 37.5 36.7  MCV 92.9 92.1 92.4 92.1 92.2  PLT 239 225 228 229 371   Basic Metabolic Panel: Recent Labs  Lab 08/29/18 1325 08/31/18 0915 09/01/18 0541 09/02/18 0452 09/04/18 0327  NA 142 141 142 141  140  K 3.5 3.7 3.3* 3.8 3.7  CL  --  109 108 111 108  CO2  --  20* 22 22 22   GLUCOSE  --  110* 100* 104* 98  BUN  --  15 11 12 18   CREATININE  --  0.71 0.75 0.76 0.91  CALCIUM  --  9.0 8.8* 8.5* 9.3  MG  --   --  2.0  --   --    GFR: Estimated Creatinine Clearance: 40.3 mL/min (by C-G formula based on SCr of 0.91 mg/dL). Liver Function Tests: Recent Labs  Lab 09/02/18 0452  AST 15  ALT 20  ALKPHOS 64  BILITOT 1.0  PROT 6.3*  ALBUMIN 3.5   No results for input(s): LIPASE, AMYLASE in the last 168 hours. No results for input(s): AMMONIA in the last 168  hours. Coagulation Profile: Recent Labs  Lab 09/02/18 0452  INR 1.2   Cardiac Enzymes: Recent Labs  Lab 08/31/18 0915 08/31/18 1451  TROPONINI <0.03 <0.03   BNP (last 3 results) No results for input(s): PROBNP in the last 8760 hours. HbA1C: No results for input(s): HGBA1C in the last 72 hours. CBG: No results for input(s): GLUCAP in the last 168 hours. Lipid Profile: No results for input(s): CHOL, HDL, LDLCALC, TRIG, CHOLHDL, LDLDIRECT in the last 72 hours. Thyroid Function Tests: No results for input(s): TSH, T4TOTAL, FREET4, T3FREE, THYROIDAB in the last 72 hours. Anemia Panel: No results for input(s): VITAMINB12, FOLATE, FERRITIN, TIBC, IRON, RETICCTPCT in the last 72 hours. Urine analysis:    Component Value Date/Time   COLORURINE STRAW (A) 04/21/2017 1253   APPEARANCEUR CLEAR 04/21/2017 1253   LABSPEC 1.005 04/21/2017 1253   PHURINE 7.0 04/21/2017 1253   GLUCOSEU NEGATIVE 04/21/2017 1253   HGBUR NEGATIVE 04/21/2017 1253   BILIRUBINUR NEGATIVE 04/21/2017 1253   KETONESUR NEGATIVE 04/21/2017 1253   PROTEINUR NEGATIVE 04/21/2017 1253   UROBILINOGEN 0.2 11/10/2010 2001   NITRITE NEGATIVE 04/21/2017 1253   LEUKOCYTESUR NEGATIVE 04/21/2017 1253   Sepsis Labs: @LABRCNTIP (procalcitonin:4,lacticidven:4) ) Recent Results (from the past 240 hour(s))  Novel Coronavirus, NAA (hospital order; send-out to ref lab)     Status: None   Collection Time: 08/26/18 11:02 AM  Result Value Ref Range Status   SARS-CoV-2, NAA NOT DETECTED NOT DETECTED Final    Comment: (NOTE) Testing was performed using the cobas(R) SARS-CoV-2 test. This test was developed and its performance characteristics determined by [employer]. This test has not been FDA cleared or approved. This test has been authorized by FDA under an Emergency Use Authorization (EUA). This test is only authorized for the duration of time the declaration that circumstances exist justifying the authorization of  the emergency use of in vitro diagnostic tests for detection of SARS-CoV-2 virus and/or diagnosis of COVID-19 infection under section 564(b)(1) of the Act, 21 U.S.C. 938HWE-9(H)(3), unless the authorization is terminated or revoked sooner. When diagnostic testing is negative, the possibility of a false negative result should be considered in the context of a patient's recent exposures and the presence of clinical signs and symptoms consistent with COVID-19. An individual without symptoms of COVID-19 and who is not shedding SARS-CoV-2 virus would expect to have  a negative (not detected) result in this assay. Performed At: [facility] [address] [place], [place] [id number] Rush Farmer MD YB:[id number]    [place]  Final    Comment: Performed at [facility], [place] [address], [place], [place] [address]  SARS Coronavirus 2 (CEPHEID -  Performed in [facility] hospital lab), Hosp Order     Status: None   Collection Time: 08/31/18  4:21 PM  Result Value Ref Range Status   SARS Coronavirus 2 NEGATIVE NEGATIVE Final    Comment: (NOTE) If result is NEGATIVE SARS-CoV-2 target nucleic acids are NOT DETECTED. The SARS-CoV-2 RNA is generally detectable in upper and lower  respiratory specimens during the acute phase of infection. The lowest  concentration of SARS-CoV-2 viral copies this assay can detect is 250  copies / mL. A negative result does not preclude SARS-CoV-2 infection  and should not be used as the sole basis for treatment or other  patient management decisions.  A negative result may occur with  improper specimen collection / handling, submission of specimen other  than nasopharyngeal swab, presence of viral mutation(s) within the  areas targeted by this assay, and inadequate number of viral copies  (<250 copies / mL). A negative result must be combined with clinical  observations, patient history, and epidemiological information. If  result is POSITIVE SARS-CoV-2 target nucleic acids are DETECTED. The SARS-CoV-2 RNA is generally detectable in upper and lower  respiratory specimens dur ing the acute phase of infection.  Positive  results are indicative of active infection with SARS-CoV-2.  Clinical  correlation with patient history and other diagnostic information is  necessary to determine patient infection status.  Positive results do  not rule out bacterial infection or co-infection with other viruses. If result is PRESUMPTIVE POSTIVE SARS-CoV-2 nucleic acids MAY BE PRESENT.   A presumptive positive result was obtained on the submitted specimen  and confirmed on repeat testing.  While 2019 novel coronavirus  (SARS-CoV-2) nucleic acids may be present in the submitted sample  additional confirmatory testing may be necessary for epidemiological  and / or clinical management purposes  to differentiate between  SARS-CoV-2 and other Sarbecovirus currently known to infect humans.  If clinically indicated additional testing with an alternate test  methodology [phone]) is advised. The SARS-CoV-2 RNA is generally  detectable in upper and lower respiratory sp ecimens during the acute  phase of infection. The expected result is Negative. Fact Sheet for Patients:  StrictlyIdeas.no Fact Sheet for Healthcare Providers: BankingDealers.co.za This test is not yet approved or cleared by the [place] FDA and has been authorized for detection and/or diagnosis of SARS-CoV-2 by FDA under an Emergency Use Authorization (EUA).  This EUA will remain in effect (meaning this test can be used) for the duration of the COVID-19 declaration under Section 564(b)(1) of the Act, 21 U.S.C. section 360bbb-3(b)(1), unless the authorization is terminated or revoked sooner. Performed at [facility], [address], [place], [place] [address]          Radiology Studies: Vas US Doppler Pre  Cabg  Result Date: 09/04/2018 PREOPERATIVE VASCULAR EVALUATION  Indications: Pre AVR. Performing Technologist: June Leap RDMS, RVT  Examination Guidelines: A complete evaluation includes B-mode imaging, spectral Doppler, color Doppler, and power Doppler as needed of all accessible portions of each vessel. Bilateral testing is considered an integral part of a complete examination. Limited examinations for reoccurring indications may be performed as noted.  Right Carotid Findings: +----------+--------+--------+--------+------------+--------+             PSV cm/s EDV cm/s Stenosis Describe     Comments  +----------+--------+--------+--------+------------+--------+  CCA Prox   58       14                                       +----------+--------+--------+--------+------------+--------+  CCA Distal 53       11                                       +----------+--------+--------+--------+------------+--------+  ICA Prox   33       9        1-39%    heterogenous           +----------+--------+--------+--------+------------+--------+  ICA Distal 63       16                                       +----------+--------+--------+--------+------------+--------+  ECA        60       12                                       +----------+--------+--------+--------+------------+--------+ Portions of this table do not appear on this page. +----------+--------+-------+----------------+------------+             PSV cm/s EDV cms Describe         Arm Pressure  +----------+--------+-------+----------------+------------+  Subclavian 61               Multiphasic, WNL 108           +----------+--------+-------+----------------+------------+ +---------+--------+--+--------+--+---------+  Vertebral PSV cm/s 38 EDV cm/s 13 Antegrade  +---------+--------+--+--------+--+---------+ Left Carotid Findings: +----------+--------+--------+--------+------------+--------+             PSV cm/s EDV cm/s Stenosis Describe     Comments   +----------+--------+--------+--------+------------+--------+  CCA Prox   43       14                                       +----------+--------+--------+--------+------------+--------+  CCA Distal 47       12                                       +----------+--------+--------+--------+------------+--------+  ICA Prox   49       18       1-39%    heterogenous           +----------+--------+--------+--------+------------+--------+  ICA Distal 71       27                                       +----------+--------+--------+--------+------------+--------+  ECA        47       7                                        +----------+--------+--------+--------+------------+--------+ +----------+--------+--------+----------------+------------+  Subclavian PSV cm/s EDV cm/s Describe         Arm Pressure  +----------+--------+--------+----------------+------------+             81                Multiphasic, WNL 103           +----------+--------+--------+----------------+------------+ +---------+--------+--+--------+--+---------+  Vertebral PSV cm/s 52 EDV cm/s 15 Antegrade  +---------+--------+--+--------+--+---------+  ABI Findings: +--------+------------------+-----+---------+--------+  Right    Rt Pressure (mmHg) Index Waveform  Comment   +--------+------------------+-----+---------+--------+  Brachial 108                      triphasic           +--------+------------------+-----+---------+--------+ +--------+------------------+-----+---------+-------+  Left     Lt Pressure (mmHg) Index Waveform  Comment  +--------+------------------+-----+---------+-------+  Brachial 103                      triphasic          +--------+------------------+-----+---------+-------+  Right Doppler Findings: +--------+--------+-----+---------+--------+  Site     Pressure Index Doppler   Comments  +--------+--------+-----+---------+--------+  Brachial 108            triphasic           +--------+--------+-----+---------+--------+  Radial                   triphasic           +--------+--------+-----+---------+--------+  Ulnar                   triphasic           +--------+--------+-----+---------+--------+  Left Doppler Findings: +--------+--------+-----+---------+--------+  Site     Pressure Index Doppler   Comments  +--------+--------+-----+---------+--------+  Brachial 103            triphasic           +--------+--------+-----+---------+--------+  Radial                  biphasic            +--------+--------+-----+---------+--------+  Ulnar                   biphasic            +--------+--------+-----+---------+--------+  Summary: Right Carotid: Velocities in the right ICA are consistent with a 1-39% stenosis. Left Carotid: Velocities in the left ICA are consistent with a 1-39% stenosis. Right Upper Extremity: Doppler waveforms remain within normal limits with right radial compression. Doppler waveforms remain within normal limits with right ulnar compression. Left Upper Extremity: Doppler waveforms decrease >50% with left radial compression. Doppler waveforms remain within normal limits with left ulnar compression.     Preliminary       Scheduled Meds:  amiodarone  200 mg Oral BID PC   diltiazem  240 mg Oral Daily   losartan  25 mg Oral Daily   metoprolol tartrate  25 mg Oral BID   Continuous Infusions:  heparin 750 Units/hr (09/03/18 2322)     LOS: 3 days      Hannah Odea, MD [facility] Pager: [email or web address] Password [facility] 09/04/2018, 10:55 AM

## 2018-09-04 NOTE — TOC Initial Note (Signed)
Transition of Care [facility]) - Initial/Assessment Note    Patient Details  Name: Hannah Roy MRN: [id number] Date of Birth: March 24, 1941  [facility] CM/SW Contact:    Bethena Roys, RN Phone Number: 09/04/2018, 11:37 AM  Clinical Narrative:  Pt presented for orthostasis- recurrent falls and dizziness. PTA independent  from home with spouse. Pt does not have any DME. Pt has PCP [facility] and uses [facility] [employer] issues with obtaining medications. Plan for aortic valve replacement 09-05-18. CM will continue to monitor post procedure for any transition of care needs home.             Expected Discharge Plan: Home/Self Care Barriers to Discharge: Continued Medical Work up(09-05-18 plan for aortic valve replacment with maze procedure. )   Expected Discharge Plan and Services Expected Discharge Plan: Home/Self Care In-house Referral: NA Discharge Planning Services: CM Consult   Living arrangements for the past 2 months: Single Family Home                  Prior Living Arrangements/Services Living arrangements for the past 2 months: Single Family Home Lives with:: Spouse Patient language and need for interpreter reviewed:: Yes Do you feel safe going back to the place where you live?: Yes      Need for Family Participation in Patient Care: Yes (Comment) Care giver support system in place?: Yes (comment) Current home services: (none-independent) Criminal Activity/Legal Involvement Pertinent to Current Situation/Hospitalization: No - Comment as needed  Activities of Daily Living Home Assistive Devices/Equipment: None ADL Screening (condition at time of admission) Patient's cognitive ability adequate to safely complete daily activities?: Yes Is the patient deaf or have difficulty hearing?: Yes Does the patient have difficulty seeing, even when wearing glasses/contacts?: No Does the patient have difficulty concentrating, remembering, or making decisions?:  No Patient able to express need for assistance with ADLs?: Yes Does the patient have difficulty dressing or bathing?: No Independently performs ADLs?: Yes (appropriate for developmental age) Does the patient have difficulty walking or climbing stairs?: No Weakness of Legs: None Weakness of Arms/Hands: None  Permission Sought/Granted Permission sought to share information with : Family Supports    Emotional Assessment Appearance:: Appears stated age Attitude/Demeanor/Rapport: Engaged Affect (typically observed): Accepting Orientation: : Oriented to Self, Oriented to Situation, Oriented to Place, Oriented to  Time Alcohol / Substance Use: Not Applicable Psych Involvement: No (comment)  Admission diagnosis:  Precordial pain [R07.2] Atrial fibrillation with RVR ([place]) [I48.91] Patient Active Problem List   Diagnosis Date Noted  . Orthostatic hypotension 09/01/2018  . Aortic insufficiency, rheumatic   . Mitral stenosis with regurgitation, rheumatic   . Tricuspid regurgitation   . 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 Orthostasis 08/31/2018  . Acute on chronic diastolic heart failure ([place]) 08/31/2018  . Rheumatic heart disease   . Persistent atrial fibrillation   . Atrial fibrillation with RVR ([place]) 05/10/2018  . Hypothyroidism   . Aortic atherosclerosis ([place]) 05/07/2018  . History of colonic polyps 08/03/2016  . Osteoarthritis of right knee 10/17/2013  . Allergic rhinitis 07/30/2012  . 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 Osteopenia 07/30/2012  . Essential hypertension, benign    PCP:  Kathyrn Drown, MD Pharmacy:   [place], [place] [address] Phone: [phone] Fax: [place], [place] [address] Phone: [phone] Fax: [phone]  [facility]  Pharmacy Mail [place], [place] [address] Phone: [phone] Fax: [place], [place] [address] [place] [address] Phone: [phone] Fax: [phone]     Social Determinants of Health (SDOH) Interventions    Readmission Risk Interventions No flowsheet data found.

## 2018-09-04 NOTE — Care Management Important Message (Signed)
Important Message  Patient Details  Name: Hannah Roy MRN: [id number] Date of Birth: [birth year]   Medicare Important Message Given:  Yes    Orbie Pyo 09/04/2018, 2:34 PM

## 2018-09-04 NOTE — Progress Notes (Signed)
Pre op cardiac surgery vascular ultrasound testing       has been completed. Preliminary results can be found under CV proc through chart review. June Leap, BS, RDMS, RVT

## 2018-09-04 NOTE — Progress Notes (Addendum)
Progress Note  Patient Name: Hannah Roy Date of Encounter: 09/04/2018  Primary Cardiologist: Kate Sable, MD   Subjective   No significant overnight events. Patient continuing to feel better. She still reports some palpitations with exertion and some dizziness with ambulation but states it has improved. Breathing is improving as well. No chest pain. Patient is a little nervous but ready for surgery tomorrow.  Inpatient Medications    Scheduled Meds: . amiodarone  200 mg Oral BID PC  . bisacodyl  5 mg Oral Once  . diltiazem  240 mg Oral Daily  . losartan  25 mg Oral Daily  . metoprolol tartrate  25 mg Oral BID   Continuous Infusions: . heparin 750 Units/hr (09/03/18 2322)   PRN Meds: diphenhydrAMINE-zinc acetate, ondansetron **OR** ondansetron (ZOFRAN) IV, zolpidem   Vital Signs    Vitals:   09/02/18 2101 09/03/18 0449 09/03/18 2024 09/04/18 0538  BP: 124/74 116/68 104/67 116/76  Pulse: 76 70 73 75  Resp: 15 14 18 20   Temp: 98.4 F (36.9 C) 97.6 F (36.4 C) 98.2 F (36.8 C) (!) 97.4 F (36.3 C)  TempSrc: Oral Oral Oral Oral  SpO2: 97% 97% 97% 97%  Weight:  55.2 kg  54.5 kg  Height:       No intake or output data in the 24 hours ending 09/04/18 0839 Last 3 Weights 09/04/2018 09/03/2018 09/02/2018  Weight (lbs) 120 lb 2.4 oz 121 lb 11.2 oz 123 lb  Weight (kg) 54.5 kg 55.203 kg 55.792 kg      Telemetry    Atrial fibrillation with rates ranging from 60's to 120's (mostly with exertion). - Personally Reviewed  ECG    No new ECG tracings today. - Personally Reviewed  Physical Exam   GEN: 78 year old female resting comfortably in no acute distress.   Neck: Supple. Cardiac: Irregularly irregular rhythm with normal. Murmur present. No rubs or gallops.  Respiratory: Clear to auscultation bilaterally. No wheezes, rhonchi, or rales. GI: Soft, non-tender, non-distended. MS: Mild lower extremity edema with compression stockings in place.  Neuro:  No focal  deficits.  Skin: Warm and dry. Psych: Normal affect. Responds appropriately.  Labs    Chemistry Recent Labs  Lab 09/01/18 0541 09/02/18 0452 09/04/18 0327  NA 142 141 140  K 3.3* 3.8 3.7  CL 108 111 108  CO2 22 22 22   GLUCOSE 100* 104* 98  BUN 11 12 18   CREATININE 0.75 0.76 0.91  CALCIUM 8.8* 8.5* 9.3  PROT  --  6.3*  --   ALBUMIN  --  3.5  --   AST  --  15  --   ALT  --  20  --   ALKPHOS  --  64  --   BILITOT  --  1.0  --   GFRNONAA >60 >60 >60  GFRAA >60 >60 >60  ANIONGAP 12 8 10      Hematology Recent Labs  Lab 09/02/18 0452 09/03/18 0443 09/04/18 0327  WBC 5.3 5.2 5.2  RBC 3.94 4.07 3.98  HGB 12.3 12.8 12.4  HCT 36.4 37.5 36.7  MCV 92.4 92.1 92.2  MCH 31.2 31.4 31.2  MCHC 33.8 34.1 33.8  RDW 12.1 12.0 12.0  PLT 228 229 232    Cardiac Enzymes Recent Labs  Lab 08/31/18 0915 08/31/18 1451  TROPONINI <0.03 <0.03   No results for input(s): TROPIPOC in the last 168 hours.   BNP Recent Labs  Lab 08/31/18 1451  BNP 549.0*  DDimer No results for input(s): DDIMER in the last 168 hours.   Radiology    No results found.  Cardiac Studies   TEE 08/29/2018: Impressions: 1. The left ventricle has normal systolic function, with an ejection fraction of 55-60%. The cavity size was normal.  2. No evidence of a thrombus present in the left atrial appendage.  3. The mitral valve is rheumatic. Moderate thickening of the anterior mitral valve leaflet, with moderately decreased mobility. Moderate mitral valve stenosis.  4. Hockey stick-like motion of the anterior mitral valve leaflet consistent with Rheumatic mitral valve disease.  5. Tricuspid valve regurgitation is moderate.  6. The aortic valve is tricuspid Mild thickening of the aortic valve Mild calcification of the aortic valve. Aortic valve regurgitation is moderate by color flow Doppler. Mild stenosis of the aortic valve.  7. Aortic valve leaflet motion mildly restricted. Aortic valve leaflets do not  fully coapt, resulting in central aortic regurgitation. Aortic regurgitaiton pressure half time 397 ms, consistent with moderate aortic regurgitation.  8. Pulmonic valve regurgitation is mild by color flow Doppler. _______________  Right/Left Heart Catheterization 08/29/2018:  Prox LAD lesion is 40% stenosed.  Mid Cx lesion is 40% stenosed.  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  The Swan-Ganz catheter was removed.  The radial sheath was removed and a TR band was placed on the right wrist to achieve patent hemostasis.  The patient left lab stable condition.  She can be discharged home today as an outpatient and restart Eliquis oral anticoagulation.  We will arrange outpatient follow-up and evaluation with Dr. Roxy Manns to determine suitability for multivalve replacement.  Patient Profile   Ms. Hannah Roy is a 78 y.o. 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    Assessment & Plan    Rheumatic Heart Disease  - Patient has rheumatic heart disease including aortic and mitral valves and associated with dilated aortic root.  She has moderate to severe aortic regurgitation and moderate mitral stenosis/regurgitation as well as moderate tricuspid regurgitation.  She has been evaluated by Dr. Roxy Manns with plan for surgical intervention tomorrow following washout of Eliquis.  Persistent Atrial Fibrillation - Rates mostly well controlled but with some continued RVR with exertion. - Continue PO Amiodarone 200mg  twice daily. - Continue Cardizem 240mg  daily and Lopressor 25mg  twice daily. - Continue  IV Heparin.  Hypertension - BP well controlled. Most recent BP 116/76. - Continue Losartan 25mg  daily. - Continue Cardizem and Lopressor as above.   Mild Non-Obstructive CAD - Left heart catheterization showed 40% stenosis of proximal LAD and 40% stenosis of mid circumflex. - No Aspirin given home Eliquis. - Continue beta-blocker as above. - Consider adding statin. Most recent LDL 110 in 07/2017.  Otherwise, per primary team.  For questions or updates, please contact [place] Please consult [email or web address] for contact info under        Signed, Darreld Mclean, PA-C  09/04/2018, 8:39 AM    Patient examined chart reviewed AS murmur on exam afib and BP reasonably controlled on heparin 5 day eliquis wash out complete tomorrow for AVR/MVR and possible TV ring with MAZE in am with Dr Noralee Stain

## 2018-09-04 NOTE — Progress Notes (Signed)
ANTICOAGULATION CONSULT NOTE - [facility] [place] for Heparin Indication: atrial fibrillation  Allergies  Allergen Reactions  . Bee Venom Anaphylaxis  . Boniva [Ibandronic Acid]     Upset stomach, flu like symptoms   . Lisinopril Cough  . Latex Rash    Patient Measurements: Height: 5\' 1"  (154.9 cm) Weight: 120 lb 2.4 oz (54.5 kg) IBW/kg (Calculated) : 47.8 Heparin Dosing Weight: 55.5 kg  Vital Signs: Temp: 97.4 F (36.3 C) (05/27 0538) Temp Source: Oral (05/27 0538) BP: 116/76 (05/27 0538) Pulse Rate: 75 (05/27 0538)  Labs: Recent Labs    09/02/18 0452 09/03/18 0443 09/03/18 1403 09/04/18 0327  HGB 12.3 12.8  --  12.4  HCT 36.4 37.5  --  36.7  PLT 228 229  --  232  APTT 93* 110* 70*  --   LABPROT 14.9  --   --   --   INR 1.2  --   --   --   HEPARINUNFRC 1.05* 0.75* 0.56 0.54  CREATININE 0.76  --   --  0.91    Estimated Creatinine Clearance: 40.3 mL/min (by C-G formula based on SCr of 0.91 mg/dL).   Assessment: 67 yoF with hx AFib on Eliquis PTA admitted with symptomatic MV stenosis. Pharmacy asked to transition to IV heparin while holding Eliquis in anticipation of surgery. Initial aPTT therapeutic, heparin level falsely high due to DOAC.  Heparin level therapeutic Heparin stops at 2200 pm for surgery 5/28  Goal of Therapy:  Heparin level 0.3-0.7 units/ml aPTT 66-102 seconds Monitor platelets by anticoagulation protocol: Yes   Plan:  Continue IV heparin at 750 units/hr Follow up after surgery 5/28  Thank you for involving pharmacy in this patient's care.  Anette Guarneri, PharmD [phone] 09/04/2018 9:25 AM  **Pharmacist phone directory can now be found on [email or web address] listed under [facility]**

## 2018-09-04 NOTE — Progress Notes (Signed)
[address]       [place] [address]             [phone]     CARDIOTHORACIC SURGERY PROGRESS NOTE   Subjective: Feels well.  No complaints  Objective: Vital signs in last 24 hours: Temp:  [97.4 F (36.3 C)-98.2 F (36.8 C)] 97.4 F (36.3 C) (05/27 0538) Pulse Rate:  [73-75] 75 (05/27 0538) Cardiac Rhythm: Atrial fibrillation;Atrial flutter (05/27 0700) Resp:  [18-20] 20 (05/27 0538) BP: (104-116)/(67-76) 116/76 (05/27 0538) SpO2:  [97 %] 97 % (05/27 0538) Weight:  [54.5 kg] 54.5 kg (05/27 0538)  Physical Exam:  Rhythm:   Afib  Breath sounds: clear  Heart sounds:  Irregular w/ systolic murmur  Incisions:  n/a  Abdomen:  soft  Extremities:  warm   Intake/Output from previous day: No intake/output data recorded. Intake/Output this shift: No intake/output data recorded.  Lab Results: Recent Labs    09/03/18 0443 09/04/18 0327  WBC 5.2 5.2  HGB 12.8 12.4  HCT 37.5 36.7  PLT 229 232   BMET:  Recent Labs    09/02/18 0452 09/04/18 0327  NA 141 140  K 3.8 3.7  CL 111 108  CO2 22 22  GLUCOSE 104* 98  BUN 12 18  CREATININE 0.76 0.91  CALCIUM 8.5* 9.3    CBG (last 3)  No results for input(s): GLUCAP in the last 72 hours. PT/INR:   Recent Labs    09/02/18 0452  LABPROT 14.9  INR 1.2    CXR:  PORTABLE CHEST 1 VIEW  COMPARISON:  Chest x-ray 05/10/2018.  FINDINGS: Lung volumes are normal. No consolidative airspace disease. No pleural effusions. No suspicious appearing pulmonary nodules or masses. No pneumothorax. No evidence of pulmonary edema. Mild cardiomegaly. Upper mediastinal contours are within normal limits. Aortic atherosclerosis.  IMPRESSION: 1. No radiographic evidence of acute cardiopulmonary disease. 2. Mild cardiomegaly. 3. Aortic atherosclerosis.   Electronically Signed   By: Vinnie Langton M.D.   On: 08/31/2018 10:05   Assessment/Plan:  Procedure(s) (LRB): AORTIC VALVE REPLACEMENT (AVR)  (N/A) MITRAL VALVE (MV) REPLACEMENT (N/A) MAZE (N/A) possible TRICUSPID VALVE REPAIR (N/A) possible plication or REPLACEMENT ASCENDING AORTA (N/A)  The patient was again counseled at length regarding treatment alternatives for the management of rheumatic heart disease with symptomatic aortic insufficiency, mitral stenosis, mitral regurgitation, tricuspid regurgitation, and recurrent persistent atrial fibrillation.  Alternative approaches such as high risk multi-valve conventional surgery and continued medical therapy without intervention were compared and contrasted at length.  Discussion was held comparing the relative risks of mechanical valve replacement with need for lifelong anticoagulation versus use of bioprosthetic tissue valves and the associated potential for late structural valve deterioration and failure.  This discussion was placed in the context of the patient's particular circumstances, and as a result the patient specifically requests that their valve be replaced using bioprosthetic tissue valves.  The relative risks and benefits of performing a maze procedure at the time of their surgery was discussed at length, including the expected likelihood of long term freedom from recurrent symptomatic atrial fibrillation and/or atrial flutter.  The patient additionally provides consent for long term follow up following surgery including participation in the [place].  The possibility of need for tricuspid valve repair was discussed.  The presence of moderate dilatation of the ascending thoracic aorta was discussed including possible need for plication repair or replacement.  The complex nature of this operation was discussed.  Expectations for  her postoperative convalescence were discussed.  All questions answered.  The patient understands and accepts all potential associated risks of surgery including but not limited to risk of death, stroke, myocardial infarction, congestive heart failure,  respiratory failure, renal failure, pneumonia, bleeding requiring blood transfusion and or reexploration, arrhythmia, heart block or bradycardia requiring permanent pacemaker, aortic dissection or other major vascular complication, pleural effusions or other delayed complications related to continued congestive heart failure, and other late complications related to valve replacement including structural valve deterioration and failure, thrombosis, endocarditis, or paravalvular leak.  For OR tomorrow.   I spent in excess of 15 minutes during the conduct of this hospital encounter and >50% of this time involved direct face-to-face encounter with the patient for counseling and/or coordination of their care.     Hannah Alberts, MD 09/04/2018 8:47 AM

## 2018-09-05 ENCOUNTER — Inpatient Hospital Stay (HOSPITAL_COMMUNITY): Payer: Medicare Other

## 2018-09-05 ENCOUNTER — Encounter (HOSPITAL_COMMUNITY)
Admission: EM | Disposition: A | Discharge status: Home / Self Care | Payer: Self-pay | Source: Home / Self Care | Attending: Thoracic Surgery (Cardiothoracic Vascular Surgery)

## 2018-09-05 ENCOUNTER — Encounter (HOSPITAL_COMMUNITY): Payer: Self-pay | Admitting: Thoracic Surgery (Cardiothoracic Vascular Surgery)

## 2018-09-05 ENCOUNTER — Inpatient Hospital Stay (HOSPITAL_COMMUNITY): Payer: Medicare Other | Admitting: Certified Registered Nurse Anesthetist

## 2018-09-05 DIAGNOSIS — I4819 Other persistent atrial fibrillation: Secondary | ICD-10-CM

## 2018-09-05 DIAGNOSIS — I951 Orthostatic hypotension: Principal | ICD-10-CM

## 2018-09-05 DIAGNOSIS — I712 Thoracic aortic aneurysm, without rupture: Secondary | ICD-10-CM

## 2018-09-05 DIAGNOSIS — I350 Nonrheumatic aortic (valve) stenosis: Secondary | ICD-10-CM

## 2018-09-05 DIAGNOSIS — I361 Nonrheumatic tricuspid (valve) insufficiency: Secondary | ICD-10-CM

## 2018-09-05 DIAGNOSIS — I340 Nonrheumatic mitral (valve) insufficiency: Secondary | ICD-10-CM

## 2018-09-05 DIAGNOSIS — I341 Nonrheumatic mitral (valve) prolapse: Secondary | ICD-10-CM

## 2018-09-05 DIAGNOSIS — Z9889 Other specified postprocedural states: Secondary | ICD-10-CM

## 2018-09-05 DIAGNOSIS — Z8679 Personal history of other diseases of the circulatory system: Secondary | ICD-10-CM

## 2018-09-05 DIAGNOSIS — Z953 Presence of xenogenic heart valve: Secondary | ICD-10-CM

## 2018-09-05 HISTORY — DX: Presence of xenogenic heart valve: Z95.3

## 2018-09-05 HISTORY — DX: Personal history of other diseases of the circulatory system: Z86.79

## 2018-09-05 HISTORY — PX: TRICUSPID VALVE REPLACEMENT: SHX816

## 2018-09-05 HISTORY — PX: CLIPPING OF ATRIAL APPENDAGE: SHX5773

## 2018-09-05 HISTORY — PX: AORTIC VALVE REPLACEMENT: SHX41

## 2018-09-05 HISTORY — PX: MAZE: SHX5063

## 2018-09-05 HISTORY — PX: MITRAL VALVE REPLACEMENT: SHX147

## 2018-09-05 HISTORY — PX: REPLACEMENT ASCENDING AORTA: SHX6068

## 2018-09-05 HISTORY — DX: Other specified postprocedural states: Z98.890

## 2018-09-05 LAB — POCT I-STAT 4, (NA,K, GLUC, HGB,HCT)
Glucose, Bld: 93 mg/dL (ref 70–99)
Glucose, Bld: 108 mg/dL — ABNORMAL HIGH (ref 70–99)
Glucose, Bld: 103 mg/dL — ABNORMAL HIGH (ref 70–99)
Glucose, Bld: 109 mg/dL — ABNORMAL HIGH (ref 70–99)
Glucose, Bld: 123 mg/dL — ABNORMAL HIGH (ref 70–99)
Glucose, Bld: 169 mg/dL — ABNORMAL HIGH (ref 70–99)
HCT: 33 % — ABNORMAL LOW (ref 36.0–46.0)
HCT: 31 % — ABNORMAL LOW (ref 36.0–46.0)
HCT: 23 % — ABNORMAL LOW (ref 36.0–46.0)
HCT: 20 % — ABNORMAL LOW (ref 36.0–46.0)
HCT: 23 % — ABNORMAL LOW (ref 36.0–46.0)
HCT: 27 % — ABNORMAL LOW (ref 36.0–46.0)
Hemoglobin: 11.2 g/dL — ABNORMAL LOW (ref 12.0–15.0)
Hemoglobin: 10.5 g/dL — ABNORMAL LOW (ref 12.0–15.0)
Hemoglobin: 7.8 g/dL — ABNORMAL LOW (ref 12.0–15.0)
Hemoglobin: 6.8 g/dL — CL (ref 12.0–15.0)
Hemoglobin: 7.8 g/dL — ABNORMAL LOW (ref 12.0–15.0)
Hemoglobin: 9.2 g/dL — ABNORMAL LOW (ref 12.0–15.0)
Potassium: 3.9 mmol/L (ref 3.5–5.1)
Potassium: 3.7 mmol/L (ref 3.5–5.1)
Potassium: 4.3 mmol/L (ref 3.5–5.1)
Potassium: 4 mmol/L (ref 3.5–5.1)
Potassium: 5.1 mmol/L (ref 3.5–5.1)
Potassium: 4 mmol/L (ref 3.5–5.1)
Sodium: 144 mmol/L (ref 135–145)
Sodium: 140 mmol/L (ref 135–145)
Sodium: 142 mmol/L (ref 135–145)
Sodium: 139 mmol/L (ref 135–145)
Sodium: 141 mmol/L (ref 135–145)
Sodium: 144 mmol/L (ref 135–145)

## 2018-09-05 LAB — CBC
HCT: 31.7 % — ABNORMAL LOW (ref 36.0–46.0)
HCT: 20 % — ABNORMAL LOW (ref 36.0–46.0)
Hemoglobin: 10.7 g/dL — ABNORMAL LOW (ref 12.0–15.0)
Hemoglobin: 6.7 g/dL — CL (ref 12.0–15.0)
MCH: 31.5 pg (ref 26.0–34.0)
MCH: 31.3 pg (ref 26.0–34.0)
MCHC: 33.8 g/dL (ref 30.0–36.0)
MCHC: 33.5 g/dL (ref 30.0–36.0)
MCV: 93.2 fL (ref 80.0–100.0)
MCV: 93.5 fL (ref 80.0–100.0)
Platelets: 75 10*3/uL — ABNORMAL LOW (ref 150–400)
Platelets: 61 10*3/uL — ABNORMAL LOW (ref 150–400)
RBC: 3.4 MIL/uL — ABNORMAL LOW (ref 3.87–5.11)
RBC: 2.14 MIL/uL — ABNORMAL LOW (ref 3.87–5.11)
RDW: 13.1 % (ref 11.5–15.5)
RDW: 13.5 % (ref 11.5–15.5)
WBC: 7.5 10*3/uL (ref 4.0–10.5)
WBC: 7.8 10*3/uL (ref 4.0–10.5)
nRBC: 0 % (ref 0.0–0.2)
nRBC: 0 % (ref 0.0–0.2)

## 2018-09-05 LAB — POCT I-STAT 7, (LYTES, BLD GAS, ICA,H+H)
Acid-Base Excess: 1 mmol/L (ref 0.0–2.0)
Acid-base deficit: 5 mmol/L — ABNORMAL HIGH (ref 0.0–2.0)
Acid-base deficit: 3 mmol/L — ABNORMAL HIGH (ref 0.0–2.0)
Bicarbonate: 23.6 mmol/L (ref 20.0–28.0)
Bicarbonate: 22.6 mmol/L (ref 20.0–28.0)
Bicarbonate: 22.5 mmol/L (ref 20.0–28.0)
Calcium, Ion: 0.91 mmol/L — ABNORMAL LOW (ref 1.15–1.40)
Calcium, Ion: 0.91 mmol/L — ABNORMAL LOW (ref 1.15–1.40)
Calcium, Ion: 0.86 mmol/L — CL (ref 1.15–1.40)
HCT: 24 % — ABNORMAL LOW (ref 36.0–46.0)
HCT: 29 % — ABNORMAL LOW (ref 36.0–46.0)
HCT: 17 % — ABNORMAL LOW (ref 36.0–46.0)
Hemoglobin: 8.2 g/dL — ABNORMAL LOW (ref 12.0–15.0)
Hemoglobin: 9.9 g/dL — ABNORMAL LOW (ref 12.0–15.0)
Hemoglobin: 5.8 g/dL — CL (ref 12.0–15.0)
O2 Saturation: 100 %
O2 Saturation: 94 %
O2 Saturation: 100 %
Patient temperature: 36.1
Patient temperature: 36.3
Potassium: 3.8 mmol/L (ref 3.5–5.1)
Potassium: 3.9 mmol/L (ref 3.5–5.1)
Potassium: 3.5 mmol/L (ref 3.5–5.1)
Sodium: 140 mmol/L (ref 135–145)
Sodium: 146 mmol/L — ABNORMAL HIGH (ref 135–145)
Sodium: 143 mmol/L (ref 135–145)
TCO2: 25 mmol/L (ref 22–32)
TCO2: 24 mmol/L (ref 22–32)
TCO2: 24 mmol/L (ref 22–32)
pCO2 arterial: 29.9 mmHg — ABNORMAL LOW (ref 32.0–48.0)
pCO2 arterial: 48.4 mmHg — ABNORMAL HIGH (ref 32.0–48.0)
pCO2 arterial: 39 mmHg (ref 32.0–48.0)
pH, Arterial: 7.505 — ABNORMAL HIGH (ref 7.350–7.450)
pH, Arterial: 7.273 — ABNORMAL LOW (ref 7.350–7.450)
pH, Arterial: 7.365 (ref 7.350–7.450)
pO2, Arterial: 381 mmHg — ABNORMAL HIGH (ref 83.0–108.0)
pO2, Arterial: 76 mmHg — ABNORMAL LOW (ref 83.0–108.0)
pO2, Arterial: 174 mmHg — ABNORMAL HIGH (ref 83.0–108.0)

## 2018-09-05 LAB — POCT I-STAT, CHEM 8
BUN: 10 mg/dL (ref 8–23)
BUN: 11 mg/dL (ref 8–23)
Calcium, Ion: 0.88 mmol/L — CL (ref 1.15–1.40)
Calcium, Ion: 0.88 mmol/L — CL (ref 1.15–1.40)
Chloride: 107 mmol/L (ref 98–111)
Chloride: 106 mmol/L (ref 98–111)
Creatinine, Ser: 0.6 mg/dL (ref 0.44–1.00)
Creatinine, Ser: 0.6 mg/dL (ref 0.44–1.00)
Glucose, Bld: 108 mg/dL — ABNORMAL HIGH (ref 70–99)
Glucose, Bld: 137 mg/dL — ABNORMAL HIGH (ref 70–99)
HCT: 30 % — ABNORMAL LOW (ref 36.0–46.0)
HCT: 18 % — ABNORMAL LOW (ref 36.0–46.0)
Hemoglobin: 10.2 g/dL — ABNORMAL LOW (ref 12.0–15.0)
Hemoglobin: 6.1 g/dL — CL (ref 12.0–15.0)
Potassium: 3.4 mmol/L — ABNORMAL LOW (ref 3.5–5.1)
Potassium: 3.6 mmol/L (ref 3.5–5.1)
Sodium: 146 mmol/L — ABNORMAL HIGH (ref 135–145)
Sodium: 143 mmol/L (ref 135–145)
TCO2: 23 mmol/L (ref 22–32)
TCO2: 22 mmol/L (ref 22–32)

## 2018-09-05 LAB — COMPREHENSIVE METABOLIC PANEL
ALT: 39 U/L (ref 0–44)
AST: 36 U/L (ref 15–41)
Albumin: 3.8 g/dL (ref 3.5–5.0)
Alkaline Phosphatase: 57 U/L (ref 38–126)
Anion gap: 8 (ref 5–15)
BUN: 16 mg/dL (ref 8–23)
CO2: 28 mmol/L (ref 22–32)
Calcium: 9.6 mg/dL (ref 8.9–10.3)
Chloride: 105 mmol/L (ref 98–111)
Creatinine, Ser: 0.95 mg/dL (ref 0.44–1.00)
GFR calc Af Amer: >60 mL/min (ref >60)
GFR calc non Af Amer: 59 mL/min — ABNORMAL LOW (ref >60)
Glucose, Bld: 94 mg/dL (ref 70–99)
Potassium: 4 mmol/L (ref 3.5–5.1)
Sodium: 141 mmol/L (ref 135–145)
Total Bilirubin: 0.6 mg/dL (ref 0.3–1.2)
Total Protein: 6.4 g/dL — ABNORMAL LOW (ref 6.5–8.1)

## 2018-09-05 LAB — HEMOGLOBIN AND HEMATOCRIT, BLOOD
HCT: 26 % — ABNORMAL LOW (ref 36.0–46.0)
Hemoglobin: 8.8 g/dL — ABNORMAL LOW (ref 12.0–15.0)

## 2018-09-05 LAB — ECHO INTRAOPERATIVE TEE
Height: 61 in
Weight: 1908.3 oz

## 2018-09-05 LAB — CREATININE, SERUM
Creatinine, Ser: 0.7 mg/dL (ref 0.44–1.00)
GFR calc Af Amer: >60 mL/min (ref >60)
GFR calc non Af Amer: >60 mL/min (ref >60)

## 2018-09-05 LAB — HEMOGLOBIN A1C
Hgb A1c MFr Bld: 5.4 % (ref 4.8–5.6)
Mean Plasma Glucose: 108 mg/dL

## 2018-09-05 LAB — MAGNESIUM: Magnesium: 3.4 mg/dL — ABNORMAL HIGH (ref 1.7–2.4)

## 2018-09-05 LAB — PREPARE RBC (CROSSMATCH)

## 2018-09-05 LAB — PROTIME-INR
INR: 2 — ABNORMAL HIGH (ref 0.8–1.2)
Prothrombin Time: 22.4 seconds — ABNORMAL HIGH (ref 11.4–15.2)

## 2018-09-05 LAB — FIBRINOGEN: Fibrinogen: 139 mg/dL — ABNORMAL LOW (ref 210–475)

## 2018-09-05 LAB — APTT: aPTT: 49 seconds — ABNORMAL HIGH (ref 24–36)

## 2018-09-05 LAB — PLATELET COUNT: Platelets: 96 10*3/uL — ABNORMAL LOW (ref 150–400)

## 2018-09-05 SURGERY — REPLACEMENT, AORTIC VALVE, OPEN
Anesthesia: General | Site: Chest

## 2018-09-05 MED ORDER — SODIUM CHLORIDE 0.9 % IV SOLN
INTRAVENOUS | Status: DC
  Administered 2018-09-05: 17:00:00 via INTRAVENOUS

## 2018-09-05 MED ORDER — ONDANSETRON HCL 4 MG/2ML IJ SOLN
4.0000 mg | Freq: Four times a day (QID) | INTRAMUSCULAR | Status: DC | PRN
  Administered 2018-09-06 – 2018-09-09 (×8): 4 mg via INTRAVENOUS
  Filled 2018-09-05 (×10): qty 2

## 2018-09-05 MED ORDER — DIPHENHYDRAMINE HCL 50 MG/ML IJ SOLN
INTRAMUSCULAR | Status: DC | PRN
  Administered 2018-09-05: 12.5 mg via INTRAVENOUS

## 2018-09-05 MED ORDER — POTASSIUM CHLORIDE 10 MEQ/50ML IV SOLN
10.0000 meq | INTRAVENOUS | Status: AC
  Administered 2018-09-05 (×3): 10 meq via INTRAVENOUS

## 2018-09-05 MED ORDER — DEXAMETHASONE SODIUM PHOSPHATE 10 MG/ML IJ SOLN
INTRAMUSCULAR | Status: DC | PRN
  Administered 2018-09-05: 5 mg via INTRAVENOUS

## 2018-09-05 MED ORDER — MAGNESIUM SULFATE 4 GM/100ML IV SOLN
4.0000 g | Freq: Once | INTRAVENOUS | Status: AC
  Administered 2018-09-05: 4 g via INTRAVENOUS
  Filled 2018-09-05: qty 100

## 2018-09-05 MED ORDER — LACTATED RINGERS IV SOLN: INTRAVENOUS | Status: DC

## 2018-09-05 MED ORDER — SODIUM CHLORIDE 0.9% IV SOLUTION: Freq: Once | INTRAVENOUS | Status: DC

## 2018-09-05 MED ORDER — SODIUM CHLORIDE 0.9 % IV SOLN
INTRAVENOUS | Status: DC | PRN
  Administered 2018-09-05: 25 ug/min via INTRAVENOUS

## 2018-09-05 MED ORDER — VANCOMYCIN HCL IN DEXTROSE 1-5 GM/200ML-% IV SOLN
1000.0000 mg | Freq: Once | INTRAVENOUS | Status: AC
  Administered 2018-09-05: 1000 mg via INTRAVENOUS
  Filled 2018-09-05: qty 200

## 2018-09-05 MED ORDER — BISACODYL 5 MG PO TBEC
10.0000 mg | DELAYED_RELEASE_TABLET | Freq: Every day | ORAL | Status: DC
  Administered 2018-09-06 – 2018-09-12 (×7): 10 mg via ORAL
  Filled 2018-09-05 (×8): qty 2

## 2018-09-05 MED ORDER — CHLORHEXIDINE GLUCONATE 0.12% ORAL RINSE (MEDLINE KIT)
15.0000 mL | Freq: Two times a day (BID) | OROMUCOSAL | Status: DC
  Administered 2018-09-05 – 2018-09-06 (×2): 15 mL via OROMUCOSAL

## 2018-09-05 MED ORDER — PROTAMINE SULFATE 10 MG/ML IV SOLN
INTRAVENOUS | Status: DC | PRN
  Administered 2018-09-05: 10 mg via INTRAVENOUS
  Administered 2018-09-05 (×3): 30 mg via INTRAVENOUS
  Administered 2018-09-05 (×2): 20 mg via INTRAVENOUS
  Administered 2018-09-05 (×2): 30 mg via INTRAVENOUS

## 2018-09-05 MED ORDER — SODIUM CHLORIDE (PF) 0.9 % IJ SOLN
OROMUCOSAL | Status: DC | PRN
  Administered 2018-09-05 (×3): via TOPICAL

## 2018-09-05 MED ORDER — ROCURONIUM BROMIDE 10 MG/ML (PF) SYRINGE
PREFILLED_SYRINGE | INTRAVENOUS | Status: AC
  Filled 2018-09-05: qty 10

## 2018-09-05 MED ORDER — MIDAZOLAM HCL 2 MG/2ML IJ SOLN
INTRAMUSCULAR | Status: DC | PRN
  Administered 2018-09-05: 5 mg via INTRAVENOUS
  Administered 2018-09-05: 2 mg via INTRAVENOUS
  Administered 2018-09-05: 3 mg via INTRAVENOUS

## 2018-09-05 MED ORDER — FENTANYL CITRATE (PF) 250 MCG/5ML IJ SOLN
INTRAMUSCULAR | Status: AC
  Filled 2018-09-05: qty 25

## 2018-09-05 MED ORDER — PROPOFOL 10 MG/ML IV BOLUS
INTRAVENOUS | Status: AC
  Filled 2018-09-05: qty 20

## 2018-09-05 MED ORDER — HEPARIN SODIUM (PORCINE) 1000 UNIT/ML IJ SOLN
INTRAMUSCULAR | Status: DC | PRN
  Administered 2018-09-05: 20000 [IU] via INTRAVENOUS
  Administered 2018-09-05: 5000 [IU] via INTRAVENOUS

## 2018-09-05 MED ORDER — INSULIN REGULAR BOLUS VIA INFUSION
0.0000 [IU] | Freq: Three times a day (TID) | INTRAVENOUS | Status: DC
  Filled 2018-09-05: qty 10

## 2018-09-05 MED ORDER — ACETAMINOPHEN 160 MG/5ML PO SOLN
1000.0000 mg | Freq: Four times a day (QID) | ORAL | Status: DC
  Administered 2018-09-05: 1000 mg
  Filled 2018-09-05: qty 40.6

## 2018-09-05 MED ORDER — MIDAZOLAM HCL 2 MG/2ML IJ SOLN: 2.0000 mg | INTRAMUSCULAR | Status: DC | PRN

## 2018-09-05 MED ORDER — CHLORHEXIDINE GLUCONATE CLOTH 2 % EX PADS
6.0000 | MEDICATED_PAD | Freq: Every day | CUTANEOUS | Status: DC
  Administered 2018-09-06 – 2018-09-09 (×4): 6 via TOPICAL

## 2018-09-05 MED ORDER — SODIUM CHLORIDE 0.9% IV SOLUTION
Freq: Once | INTRAVENOUS | Status: AC
  Administered 2018-09-05: 23:00:00 via INTRAVENOUS

## 2018-09-05 MED ORDER — LACTATED RINGERS IV SOLN
INTRAVENOUS | Status: DC | PRN
  Administered 2018-09-05: 07:00:00 via INTRAVENOUS

## 2018-09-05 MED ORDER — SODIUM CHLORIDE 0.9 % IV SOLN
1.5000 g | Freq: Two times a day (BID) | INTRAVENOUS | Status: AC
  Administered 2018-09-05 – 2018-09-07 (×4): 1.5 g via INTRAVENOUS
  Filled 2018-09-05 (×4): qty 1.5

## 2018-09-05 MED ORDER — SODIUM CHLORIDE 0.9% FLUSH
3.0000 mL | Freq: Two times a day (BID) | INTRAVENOUS | Status: DC
  Administered 2018-09-06 – 2018-09-13 (×12): 3 mL via INTRAVENOUS

## 2018-09-05 MED ORDER — SODIUM CHLORIDE 0.9 % IV SOLN
INTRAVENOUS | Status: DC | PRN
  Administered 2018-09-05: .7 [IU]/h via INTRAVENOUS

## 2018-09-05 MED ORDER — SODIUM CHLORIDE 0.9 % IV SOLN: INTRAVENOUS | Status: DC | PRN

## 2018-09-05 MED ORDER — ACETAMINOPHEN 500 MG PO TABS
1000.0000 mg | ORAL_TABLET | Freq: Four times a day (QID) | ORAL | Status: AC
  Administered 2018-09-06 – 2018-09-10 (×14): 1000 mg via ORAL
  Filled 2018-09-05 (×14): qty 2

## 2018-09-05 MED ORDER — PHENYLEPHRINE HCL (PRESSORS) 10 MG/ML IV SOLN
INTRAVENOUS | Status: DC | PRN
  Administered 2018-09-05 (×3): 40 ug via INTRAVENOUS

## 2018-09-05 MED ORDER — ASPIRIN EC 325 MG PO TBEC
325.0000 mg | DELAYED_RELEASE_TABLET | Freq: Every day | ORAL | Status: DC
  Administered 2018-09-06 – 2018-09-08 (×3): 325 mg via ORAL
  Filled 2018-09-05 (×3): qty 1

## 2018-09-05 MED ORDER — ACETAMINOPHEN 650 MG RE SUPP
650.0000 mg | Freq: Once | RECTAL | Status: AC
  Administered 2018-09-05: 650 mg via RECTAL
  Filled 2018-09-05: qty 1

## 2018-09-05 MED ORDER — POTASSIUM CHLORIDE 10 MEQ/50ML IV SOLN
10.0000 meq | INTRAVENOUS | Status: AC
  Administered 2018-09-05 – 2018-09-06 (×3): 10 meq via INTRAVENOUS
  Filled 2018-09-05 (×3): qty 50

## 2018-09-05 MED ORDER — ROCURONIUM BROMIDE 50 MG/5ML IV SOSY
PREFILLED_SYRINGE | INTRAVENOUS | Status: DC | PRN
  Administered 2018-09-05: 50 mg via INTRAVENOUS
  Administered 2018-09-05: 100 mg via INTRAVENOUS

## 2018-09-05 MED ORDER — EPHEDRINE SULFATE-NACL 50-0.9 MG/10ML-% IV SOSY
PREFILLED_SYRINGE | INTRAVENOUS | Status: DC | PRN
  Administered 2018-09-05: 5 mg via INTRAVENOUS

## 2018-09-05 MED ORDER — ONDANSETRON HCL 4 MG/2ML IJ SOLN
INTRAMUSCULAR | Status: AC
  Filled 2018-09-05: qty 2

## 2018-09-05 MED ORDER — FAMOTIDINE IN NACL 20-0.9 MG/50ML-% IV SOLN
20.0000 mg | Freq: Two times a day (BID) | INTRAVENOUS | Status: AC
  Administered 2018-09-05 – 2018-09-06 (×2): 20 mg via INTRAVENOUS
  Filled 2018-09-05 (×2): qty 50

## 2018-09-05 MED ORDER — TRAMADOL HCL 50 MG PO TABS: 50.0000 mg | ORAL_TABLET | ORAL | Status: DC | PRN

## 2018-09-05 MED ORDER — DOCUSATE SODIUM 100 MG PO CAPS
200.0000 mg | ORAL_CAPSULE | Freq: Every day | ORAL | Status: DC
  Administered 2018-09-06 – 2018-09-11 (×6): 200 mg via ORAL
  Filled 2018-09-05 (×7): qty 2

## 2018-09-05 MED ORDER — MILRINONE LACTATE IN DEXTROSE 20-5 MG/100ML-% IV SOLN
0.2500 ug/kg/min | INTRAVENOUS | Status: DC
  Administered 2018-09-05: 0.25 ug/kg/min via INTRAVENOUS

## 2018-09-05 MED ORDER — NITROGLYCERIN IN D5W 200-5 MCG/ML-% IV SOLN
0.0000 ug/min | INTRAVENOUS | Status: DC
  Administered 2018-09-05: 10 ug/min via INTRAVENOUS
  Administered 2018-09-06: 50 ug/min via INTRAVENOUS
  Filled 2018-09-05: qty 250

## 2018-09-05 MED ORDER — ASPIRIN 81 MG PO CHEW: 324.0000 mg | CHEWABLE_TABLET | Freq: Every day | ORAL | Status: DC

## 2018-09-05 MED ORDER — PHENYLEPHRINE 40 MCG/ML (10ML) SYRINGE FOR IV PUSH (FOR BLOOD PRESSURE SUPPORT)
PREFILLED_SYRINGE | INTRAVENOUS | Status: AC
  Filled 2018-09-05: qty 10

## 2018-09-05 MED ORDER — PROPOFOL 10 MG/ML IV BOLUS
INTRAVENOUS | Status: DC | PRN
  Administered 2018-09-05 (×2): 20 mg via INTRAVENOUS

## 2018-09-05 MED ORDER — ACETAMINOPHEN 160 MG/5ML PO SOLN: 650.0000 mg | Freq: Once | ORAL | Status: AC

## 2018-09-05 MED ORDER — CHLORHEXIDINE GLUCONATE 0.12 % MT SOLN
15.0000 mL | OROMUCOSAL | Status: AC
  Administered 2018-09-05: 15 mL via OROMUCOSAL

## 2018-09-05 MED ORDER — FENTANYL CITRATE (PF) 250 MCG/5ML IJ SOLN
INTRAMUSCULAR | Status: DC | PRN
  Administered 2018-09-05: 250 ug via INTRAVENOUS
  Administered 2018-09-05: 25 ug via INTRAVENOUS
  Administered 2018-09-05 (×2): 250 ug via INTRAVENOUS
  Administered 2018-09-05: 275 ug via INTRAVENOUS
  Administered 2018-09-05: 200 ug via INTRAVENOUS

## 2018-09-05 MED ORDER — ARTIFICIAL TEARS OPHTHALMIC OINT
TOPICAL_OINTMENT | OPHTHALMIC | Status: DC | PRN
  Administered 2018-09-05: 1 via OPHTHALMIC

## 2018-09-05 MED ORDER — DEXMEDETOMIDINE HCL IN NACL 200 MCG/50ML IV SOLN
INTRAVENOUS | Status: DC | PRN
  Administered 2018-09-05: 0.4 ug/kg/h via INTRAVENOUS

## 2018-09-05 MED ORDER — SODIUM CHLORIDE 0.45 % IV SOLN: INTRAVENOUS | Status: DC | PRN

## 2018-09-05 MED ORDER — ONDANSETRON HCL 4 MG/2ML IJ SOLN
INTRAMUSCULAR | Status: DC | PRN
  Administered 2018-09-05: 4 mg via INTRAVENOUS

## 2018-09-05 MED ORDER — METOPROLOL TARTRATE 5 MG/5ML IV SOLN
2.5000 mg | INTRAVENOUS | Status: DC | PRN
  Administered 2018-09-06 (×2): 2.5 mg via INTRAVENOUS
  Filled 2018-09-05: qty 5

## 2018-09-05 MED ORDER — LIDOCAINE 2% (20 MG/ML) 5 ML SYRINGE
INTRAMUSCULAR | Status: AC
  Filled 2018-09-05: qty 5

## 2018-09-05 MED ORDER — EPHEDRINE 5 MG/ML INJ
INTRAVENOUS | Status: AC
  Filled 2018-09-05: qty 10

## 2018-09-05 MED ORDER — CHLORHEXIDINE GLUCONATE CLOTH 2 % EX PADS: 6.0000 | MEDICATED_PAD | Freq: Every day | CUTANEOUS | Status: DC

## 2018-09-05 MED ORDER — SODIUM CHLORIDE (PF) 0.9 % IJ SOLN
INTRAMUSCULAR | Status: AC
  Filled 2018-09-05: qty 10

## 2018-09-05 MED ORDER — SODIUM CHLORIDE 0.9 % IV SOLN
INTRAVENOUS | Status: DC | PRN
  Administered 2018-09-05: 09:00:00 25 ug/min via INTRAVENOUS

## 2018-09-05 MED ORDER — INSULIN REGULAR(HUMAN) IN NACL 100-0.9 UT/100ML-% IV SOLN
INTRAVENOUS | Status: DC
  Administered 2018-09-05 (×2): 1.4 [IU]/h via INTRAVENOUS

## 2018-09-05 MED ORDER — LACTATED RINGERS IV SOLN: 500.0000 mL | Freq: Once | INTRAVENOUS | Status: DC | PRN

## 2018-09-05 MED ORDER — SODIUM CHLORIDE 0.9 % IV SOLN: 250.0000 mL | INTRAVENOUS | Status: DC

## 2018-09-05 MED ORDER — ALBUMIN HUMAN 5 % IV SOLN
INTRAVENOUS | Status: DC | PRN
  Administered 2018-09-05 (×2): via INTRAVENOUS

## 2018-09-05 MED ORDER — MORPHINE SULFATE (PF) 2 MG/ML IV SOLN
1.0000 mg | INTRAVENOUS | Status: DC | PRN
  Administered 2018-09-05: 1 mg via INTRAVENOUS
  Administered 2018-09-05: 3 mg via INTRAVENOUS
  Administered 2018-09-05 – 2018-09-06 (×3): 2 mg via INTRAVENOUS
  Administered 2018-09-06: 4 mg via INTRAVENOUS
  Administered 2018-09-07 (×2): 2 mg via INTRAVENOUS
  Filled 2018-09-05: qty 1
  Filled 2018-09-05 (×2): qty 2
  Filled 2018-09-05 (×5): qty 1

## 2018-09-05 MED ORDER — SODIUM CHLORIDE 0.9 % IR SOLN
Status: DC | PRN
  Administered 2018-09-05: 5000 mL

## 2018-09-05 MED ORDER — ORAL CARE MOUTH RINSE
15.0000 mL | OROMUCOSAL | Status: DC
  Administered 2018-09-05 – 2018-09-06 (×6): 15 mL via OROMUCOSAL

## 2018-09-05 MED ORDER — ALBUMIN HUMAN 5 % IV SOLN
250.0000 mL | INTRAVENOUS | Status: AC | PRN
  Administered 2018-09-05 (×4): 12.5 g via INTRAVENOUS
  Filled 2018-09-05: qty 500

## 2018-09-05 MED ORDER — SUCCINYLCHOLINE CHLORIDE 200 MG/10ML IV SOSY
PREFILLED_SYRINGE | INTRAVENOUS | Status: AC
  Filled 2018-09-05: qty 10

## 2018-09-05 MED ORDER — OXYCODONE HCL 5 MG PO TABS
5.0000 mg | ORAL_TABLET | ORAL | Status: DC | PRN
  Administered 2018-09-06 (×2): 10 mg via ORAL
  Administered 2018-09-07 (×2): 5 mg via ORAL
  Administered 2018-09-07: 10 mg via ORAL
  Administered 2018-09-07 – 2018-09-13 (×12): 5 mg via ORAL
  Filled 2018-09-05: qty 1
  Filled 2018-09-05 (×2): qty 2
  Filled 2018-09-05: qty 1
  Filled 2018-09-05: qty 2
  Filled 2018-09-05 (×12): qty 1

## 2018-09-05 MED ORDER — DOPAMINE-DEXTROSE 3.2-5 MG/ML-% IV SOLN
0.0000 ug/kg/min | INTRAVENOUS | Status: DC
  Administered 2018-09-05: 3 ug/kg/min via INTRAVENOUS

## 2018-09-05 MED ORDER — MILRINONE LACTATE IN DEXTROSE 20-5 MG/100ML-% IV SOLN
0.0000 ug/kg/min | INTRAVENOUS | Status: DC
  Administered 2018-09-06: 08:00:00 0.3 ug/kg/min via INTRAVENOUS
  Administered 2018-09-07: 08:00:00 0.2 ug/kg/min via INTRAVENOUS
  Filled 2018-09-05 (×2): qty 100

## 2018-09-05 MED ORDER — DEXMEDETOMIDINE HCL IN NACL 200 MCG/50ML IV SOLN: 0.0000 ug/kg/h | INTRAVENOUS | Status: DC

## 2018-09-05 MED ORDER — NITROGLYCERIN IN D5W 200-5 MCG/ML-% IV SOLN
INTRAVENOUS | Status: DC | PRN
  Administered 2018-09-05: 5 ug/min via INTRAVENOUS

## 2018-09-05 MED ORDER — DIPHENHYDRAMINE HCL 50 MG/ML IJ SOLN
INTRAMUSCULAR | Status: AC
  Filled 2018-09-05: qty 1

## 2018-09-05 MED ORDER — MIDAZOLAM HCL (PF) 10 MG/2ML IJ SOLN
INTRAMUSCULAR | Status: AC
  Filled 2018-09-05: qty 2

## 2018-09-05 MED ORDER — SODIUM CHLORIDE 0.9% FLUSH: 3.0000 mL | INTRAVENOUS | Status: DC | PRN

## 2018-09-05 MED ORDER — PHENYLEPHRINE HCL-NACL 20-0.9 MG/250ML-% IV SOLN
0.0000 ug/min | INTRAVENOUS | Status: DC
  Administered 2018-09-05: 10 ug/min via INTRAVENOUS

## 2018-09-05 MED ORDER — BISACODYL 10 MG RE SUPP
10.0000 mg | Freq: Every day | RECTAL | Status: DC
  Filled 2018-09-05: qty 1

## 2018-09-05 MED ORDER — SODIUM CHLORIDE 0.9 % IV SOLN
1.0000 g | Freq: Once | INTRAVENOUS | Status: AC
  Administered 2018-09-05: 1 g via INTRAVENOUS
  Filled 2018-09-05: qty 10

## 2018-09-05 MED ORDER — DEXAMETHASONE SODIUM PHOSPHATE 10 MG/ML IJ SOLN
INTRAMUSCULAR | Status: AC
  Filled 2018-09-05: qty 1

## 2018-09-05 MED ORDER — PANTOPRAZOLE SODIUM 40 MG PO TBEC
40.0000 mg | DELAYED_RELEASE_TABLET | Freq: Every day | ORAL | Status: DC
  Administered 2018-09-07 – 2018-09-13 (×6): 40 mg via ORAL
  Filled 2018-09-05 (×7): qty 1

## 2018-09-05 SURGICAL SUPPLY — 143 items
ADAPTER CARDIO PERF ANTE/RETRO (ADAPTER) ×7 IMPLANT
ADH SKN CLS APL DERMABOND .7 (GAUZE/BANDAGES/DRESSINGS) ×3
ADPR PRFSN 84XANTGRD RTRGD (ADAPTER) ×3
APL SRG 7X2 LUM MLBL SLNT (VASCULAR PRODUCTS) ×3
APPLICATOR COTTON TIP 6IN STRL (MISCELLANEOUS) IMPLANT
APPLICATOR TIP COSEAL (VASCULAR PRODUCTS) ×1 IMPLANT
ARTICLIP LAA PROCLIP II 45 (Clip) ×4 IMPLANT
BAG DECANTER FOR FLEXI CONT (MISCELLANEOUS) ×8 IMPLANT
BLADE STERNUM SYSTEM 6 (BLADE) ×7 IMPLANT
BLADE SURG 11 STRL SS (BLADE) ×7 IMPLANT
CANISTER SUCT 3000ML PPV (MISCELLANEOUS) ×7 IMPLANT
CANN PRFSN 3/8X14X24FR PCFC (MISCELLANEOUS)
CANN PRFSN 3/8XCNCT ST RT ANG (MISCELLANEOUS)
CANNULA AORTIC ROOT 9FR (CANNULA) ×1 IMPLANT
CANNULA EZ GLIDE AORTIC 21FR (CANNULA) ×1 IMPLANT
CANNULA FEM VENOUS REMOTE 22FR (CANNULA) ×1 IMPLANT
CANNULA GUNDRY RCSP 15FR (MISCELLANEOUS) ×7 IMPLANT
CANNULA OPTISITE PERFUSION 16F (CANNULA) ×1 IMPLANT
CANNULA OPTISITE PERFUSION 18F (CANNULA) IMPLANT
CANNULA PRFSN 3/8X14X24FR PCFC (MISCELLANEOUS) IMPLANT
CANNULA PRFSN 3/8XCNCT RT ANG (MISCELLANEOUS) IMPLANT
CANNULA SOFTFLOW AORTIC 7M21FR (CANNULA) ×4 IMPLANT
CANNULA VEN MTL TIP RT (MISCELLANEOUS)
CANNULA VENNOUS METAL TIP 20FR (CANNULA) ×1 IMPLANT
CARDIOBLATE CARDIAC ABLATION (MISCELLANEOUS)
CATH HEART VENT LEFT (CATHETERS) ×3 IMPLANT
CATH ROBINSON RED A/P 18FR (CATHETERS) ×4 IMPLANT
CATH THOR STR 32F SOFT 20 RADI (CATHETERS) ×2 IMPLANT
CATH THORACIC 28FR RT ANG (CATHETERS) IMPLANT
CATH THORACIC 36FR (CATHETERS) IMPLANT
CAUTERY SURG HI TEMP FINE TIP (MISCELLANEOUS) ×1 IMPLANT
CLAMP ISOLATOR SYNERGY LG (MISCELLANEOUS) ×4 IMPLANT
CLAMP OLL ABLATION (MISCELLANEOUS) ×1 IMPLANT
CLIP FOGARTY SPRING 6M (CLIP) IMPLANT
CONN ST 1/4X3/8  BEN (MISCELLANEOUS) ×1
CONN ST 1/4X3/8 BEN (MISCELLANEOUS) IMPLANT
CONNECTOR 1/2X3/8X1/2 3 WAY (MISCELLANEOUS) ×1
CONNECTOR 1/2X3/8X1/2 3WAY (MISCELLANEOUS) IMPLANT
CONT SPEC 4OZ CLIKSEAL STRL BL (MISCELLANEOUS) ×1 IMPLANT
COVER PROBE W GEL 5X96 (DRAPES) ×1 IMPLANT
CRADLE DONUT ADULT HEAD (MISCELLANEOUS) ×7 IMPLANT
DERMABOND ADVANCED (GAUZE/BANDAGES/DRESSINGS) ×1
DERMABOND ADVANCED .7 DNX12 (GAUZE/BANDAGES/DRESSINGS) IMPLANT
DEVICE ATRICLIP LAA PRCLPII 45 (Clip) IMPLANT
DEVICE CARDIOBLATE CARDIAC ABL (MISCELLANEOUS) IMPLANT
DEVICE CLOSURE PERCLS PRGLD 6F (VASCULAR PRODUCTS) IMPLANT
DEVICE SUT CK QUICK LOAD MINI (Prosthesis & Implant Heart) ×10 IMPLANT
DRAPE BILATERAL SPLIT (DRAPES) IMPLANT
DRAPE CARDIOVASCULAR INCISE (DRAPES) ×4
DRAPE CV SPLIT W-CLR ANES SCRN (DRAPES) IMPLANT
DRAPE SLUSH/WARMER DISC (DRAPES) ×7 IMPLANT
DRAPE SRG 135X102X78XABS (DRAPES) ×3 IMPLANT
DRSG AQUACEL AG ADV 3.5X 6 (GAUZE/BANDAGES/DRESSINGS) ×1 IMPLANT
DRSG AQUACEL AG ADV 3.5X14 (GAUZE/BANDAGES/DRESSINGS) ×1 IMPLANT
ELECT REM PT RETURN 9FT ADLT (ELECTROSURGICAL) ×8
ELECTRODE REM PT RTRN 9FT ADLT (ELECTROSURGICAL) ×12 IMPLANT
FELT TEFLON 1X6 (MISCELLANEOUS) ×14 IMPLANT
GAUZE SPONGE 4X4 12PLY STRL (GAUZE/BANDAGES/DRESSINGS) ×13 IMPLANT
GLOVE BIO SURGEON STRL SZ 6 (GLOVE) IMPLANT
GLOVE BIO SURGEON STRL SZ 6.5 (GLOVE) IMPLANT
GLOVE BIO SURGEON STRL SZ7 (GLOVE) IMPLANT
GLOVE BIO SURGEON STRL SZ7.5 (GLOVE) IMPLANT
GLOVE ORTHO TXT STRL SZ7.5 (GLOVE) ×12 IMPLANT
GOWN STRL REUS W/ TWL LRG LVL3 (GOWN DISPOSABLE) ×24 IMPLANT
GOWN STRL REUS W/TWL LRG LVL3 (GOWN DISPOSABLE) ×40
GRAFT WOVEN D/V 28DX30L (Vascular Products) ×1 IMPLANT
HEMOSTAT POWDER SURGIFOAM 1G (HEMOSTASIS) ×21 IMPLANT
INSERT FOGARTY XLG (MISCELLANEOUS) ×7 IMPLANT
KIT BASIN OR (CUSTOM PROCEDURE TRAY) ×7 IMPLANT
KIT DILATOR VASC 18G NDL (KITS) ×1 IMPLANT
KIT DRAINAGE VACCUM ASSIST (KITS) ×1 IMPLANT
KIT SUT CK MINI COMBO 4X17 (Prosthesis & Implant Heart) ×1 IMPLANT
KIT TURNOVER KIT B (KITS) ×7 IMPLANT
LEAD PACING MYOCARDI (MISCELLANEOUS) ×4 IMPLANT
LINE VENT (MISCELLANEOUS) ×1 IMPLANT
LOOP VESSEL SUPERMAXI WHITE (MISCELLANEOUS) ×7 IMPLANT
MARKER GRAFT CORONARY BYPASS (MISCELLANEOUS) IMPLANT
NS IRRIG 1000ML POUR BTL (IV SOLUTION) ×35 IMPLANT
PACK E OPEN HEART (SUTURE) ×4 IMPLANT
PACK OPEN HEART (CUSTOM PROCEDURE TRAY) ×7 IMPLANT
PAD ARMBOARD 7.5X6 YLW CONV (MISCELLANEOUS) ×14 IMPLANT
PERCLOSE PROGLIDE 6F (VASCULAR PRODUCTS) ×4
PROBE CRYO2-ABLATION MALLABLE (MISCELLANEOUS) ×1 IMPLANT
RING TRICUSPID T28 (Prosthesis & Implant Heart) ×1 IMPLANT
SEALANT SURG COSEAL 8ML (VASCULAR PRODUCTS) ×1 IMPLANT
SET CANNULATION TOURNIQUET (MISCELLANEOUS) ×1 IMPLANT
SET CARDIOPLEGIA MPS 5001102 (MISCELLANEOUS) ×1 IMPLANT
SET IRRIG TUBING LAPAROSCOPIC (IRRIGATION / IRRIGATOR) ×7 IMPLANT
SHEATH PINNACLE 8F 10CM (SHEATH) ×1 IMPLANT
SPONGE LAP 4X18 RFD (DISPOSABLE) ×1 IMPLANT
SUCKER INTRACARDIAC WEIGHTED (SUCKER) ×7 IMPLANT
SUT BONE WAX W31G (SUTURE) ×7 IMPLANT
SUT ETHIBON 2 0 V 52N 30 (SUTURE) ×3 IMPLANT
SUT ETHIBON EXCEL 2-0 V-5 (SUTURE) ×15 IMPLANT
SUT ETHIBOND (SUTURE) ×2 IMPLANT
SUT ETHIBOND 2 0 SH (SUTURE) ×2 IMPLANT
SUT ETHIBOND 2 0 V4 (SUTURE) IMPLANT
SUT ETHIBOND 2 0V4 GREEN (SUTURE) IMPLANT
SUT ETHIBOND 2-0 RB-1 WHT (SUTURE) ×2 IMPLANT
SUT ETHIBOND 4 0 RB 1 (SUTURE) IMPLANT
SUT ETHIBOND 4 0 TF (SUTURE) IMPLANT
SUT ETHIBOND V-5 VALVE (SUTURE) ×13 IMPLANT
SUT GORETEX CV 4 TH 22 36 (SUTURE) ×1 IMPLANT
SUT GORETEX CV4 TH-18 (SUTURE) ×2 IMPLANT
SUT MNCRL AB 3-0 PS2 18 (SUTURE) ×13 IMPLANT
SUT PDS AB 1 CTX 36 (SUTURE) ×14 IMPLANT
SUT PROLENE 3 0 SH 1 (SUTURE) ×8 IMPLANT
SUT PROLENE 3 0 SH DA (SUTURE) ×6 IMPLANT
SUT PROLENE 3 0 SH1 36 (SUTURE) ×13 IMPLANT
SUT PROLENE 4 0 RB 1 (SUTURE) ×44
SUT PROLENE 4 0 SH DA (SUTURE) ×13 IMPLANT
SUT PROLENE 4-0 RB1 .5 CRCL 36 (SUTURE) ×12 IMPLANT
SUT PROLENE 5 0 C 1 36 (SUTURE) IMPLANT
SUT PROLENE 6 0 C 1 30 (SUTURE) IMPLANT
SUT SILK  1 MH (SUTURE) ×4
SUT SILK 1 MH (SUTURE) IMPLANT
SUT SILK 2 0 SH CR/8 (SUTURE) IMPLANT
SUT SILK 3 0 SH CR/8 (SUTURE) IMPLANT
SUT STEEL 6MS V (SUTURE) IMPLANT
SUT STEEL STERNAL CCS#1 18IN (SUTURE) ×1 IMPLANT
SUT STEEL SZ 6 DBL 3X14 BALL (SUTURE) ×2 IMPLANT
SUT VIC AB 1 CTX 36 (SUTURE) ×4
SUT VIC AB 1 CTX36XBRD ANBCTR (SUTURE) IMPLANT
SUT VIC AB 2-0 CT1 27 (SUTURE) ×4
SUT VIC AB 2-0 CT1 TAPERPNT 27 (SUTURE) IMPLANT
SUT VIC AB 2-0 CTX 27 (SUTURE) IMPLANT
SUT VIC AB 2-0 CTX 36 (SUTURE) ×1 IMPLANT
SUT VIC AB 3-0 SH 8-18 (SUTURE) ×2 IMPLANT
SYSTEM SAHARA CHEST DRAIN ATS (WOUND CARE) ×7 IMPLANT
TAPE CLOTH SURG 4X10 WHT LF (GAUZE/BANDAGES/DRESSINGS) ×1 IMPLANT
TAPE PAPER 2X10 WHT MICROPORE (GAUZE/BANDAGES/DRESSINGS) ×1 IMPLANT
TOWEL GREEN STERILE (TOWEL DISPOSABLE) ×8 IMPLANT
TOWEL GREEN STERILE FF (TOWEL DISPOSABLE) ×8 IMPLANT
TRAY FOLEY SLVR 16FR TEMP STAT (SET/KITS/TRAYS/PACK) ×7 IMPLANT
TUBE CONNECTING 12X1/4 (SUCTIONS) ×1 IMPLANT
TUBE SUCT INTRACARD DLP 20F (MISCELLANEOUS) ×1 IMPLANT
UNDERPAD 30X30 (UNDERPADS AND DIAPERS) ×7 IMPLANT
VALVE MAGNA EASE 21MM (Prosthesis & Implant Heart) ×1 IMPLANT
VALVE MAGNA MITRAL 29MM (Prosthesis & Implant Heart) ×1 IMPLANT
VENT LEFT HEART 12002 (CATHETERS) ×4
WATER STERILE IRR 1000ML POUR (IV SOLUTION) ×16 IMPLANT
WIRE EMERALD 3MM-J .035X150CM (WIRE) ×1 IMPLANT
YANKAUER SUCT BULB TIP NO VENT (SUCTIONS) ×1 IMPLANT

## 2018-09-05 NOTE — Op Note (Signed)
CARDIOTHORACIC SURGERY OPERATIVE NOTE  Date of Procedure:  09/05/2018  Preoperative Diagnosis:   Rheumatic Valvular Heart Disease  Moderate-severe aortic insufficiency with mild aortic stenosis  Moderate-severe mitral stenosis with mild mitral regurgitation  Moderate tricuspid regurgitation  Recurrent persistent atrial fibrillation  Ascending thoracic aortic aneurysm   Postoperative Diagnosis: Same   Procedure:   Aortic  Valve Replacement   Edwards Magna Ease Bovine Bioprosthetic Tissue Valve (size 71mm, model #3300TFX, serial #[id number])   Mitral Valve Replacement   [facility] Mitral Bovine Bioprosthetic Tissue Valve (size 68mm, model #7300TFX, serial #[id number])   Tricuspid Valve Repair   Edwards mc3 Ring Annuloplasty (size 66mm, model #4900, serial [id number])   Maze Procedure   complete bilateral atrial lesion set using bipolar radiofrequency and cryothermy ablation  clipping of left atrial appendage (Atricure Pro245 left atrial clip size 24mm)   Repair Ascending Thoracic Aortic Aneurysm   Hemashield Platinum supracoronary straight graft (size 19mm, ref #[id number], serial #[id number])     Surgeon: Valentina Gu. Roxy Manns, MD  Assistant: Nicholes Rough, PA-C  Anesthesia: Laurie Panda, MD  Operative Findings:  Rheumatic valvular heart disease  Moderate-severe aortic insufficiency with mild aortic stenosis  Moderate-severe mitral stenosis with mild mitral regurgitation  Type I tricuspid valve dysfunction with moderate tricuspid regurgitation  Fusiform aneurysmal enlargement of entire ascending thoracic aorta  Normal LV systolic function  Normal RV systolic function  Moderate left atrial enlargement                           BRIEF CLINICAL NOTE AND INDICATIONS FOR SURGERY  Patient is a 78 year old female originally from [place] recently diagnosed with rheumatic heart disease and recurrent persistent atrial fibrillation who was  readmitted to the hospital with tachypalpitations, dizzy spells, and shortness of breath c/w acute on chronic diastolic congestive heart failure who has been referred for surgical consultation.  Patient has been healthy all of her adult life and physically active.  Past medical history is notable only for history of hypertension.  She denies any known history of rheumatic fever during childhood or previous history of heart murmur.  In January of this year she presented with tachypalpitations and shortness of breath.  She was seen by her primary care physician and was diagnosed with atrial fibrillation.  She was anticoagulated using Eliquis and started on diltiazem for rate control.  An echocardiogram was performed demonstrating normal left ventricular systolic function with ejection fraction estimated 60 to 65%.  There was felt to be mild to moderate mitral regurgitation, moderate to severe aortic regurgitation, and mild aortic stenosis.  There was severe left and moderate right atrial enlargement.  She was seen by Dr. [name] who has been following her carefully ever since.  She has spontaneously converted back to sinus rhythm at the time of his initial office consultation on May 17, 2018.  On Aug 09, 2018 the patient was seen in the emergency department at [facility] with recurrent atrial fibrillation and rapid ventricular response.  She underwent DC cardioversion and was discharged home.  Since then symptoms have continued to progress with worsening exertional shortness of breath, chest tightness, and palpitations.  She underwent TEE and cardiac catheterization on Aug 29, 2018.  TEE revealed normal left ventricular systolic function with rheumatic aortic and mitral valve disease.  There was moderate mitral stenosis and moderate mitral regurgitation.  There was at least moderate aortic insufficiency and mild aortic stenosis.  There was moderate tricuspid regurgitation.  Catheterization revealed  mild nonobstructive coronary artery disease and hemodynamic findings consistent with mitral stenosis and mitral regurgitation.  There was mild pulmonary hypertension.  Mean pulmonary capillary wedge pressure was 17 with V wave 26 mmHg.  Central venous pressure was 10 and cardiac output normal.  Cardiothoracic surgical consultation was requested and the patient was scheduled to see me in the office next week.  She presented to the emergency department at [facility] yesterday with worsening dizziness and shortness of breath.  She was again found to be in atrial fibrillation.  She was transferred to [facility] for further management.  CT angiogram of the chest revealed no evidence for pulmonary embolism.  There was fusiform dilatation of the ascending aorta.  COVID testing was negative.  The patient has been seen in consultation and counseled at length regarding the indications, risks and potential benefits of surgery.  All questions have been answered, and the patient provides full informed consent for the operation as described.     DETAILS OF THE OPERATIVE PROCEDURE  Preparation:  The patient is brought to the operating room on the above mentioned date and central monitoring was established by the anesthesia team including placement of Swan-Ganz catheter and radial arterial line. The patient is placed in the supine position on the operating table.  Intravenous antibiotics are administered. General endotracheal anesthesia is induced uneventfully. A Foley catheter is placed.  Baseline transesophageal echocardiogram was performed.  Findings were notable for classical rheumatic valvular heart disease.  The aortic valve is trileaflet.  There was thickening and retracted leaflets with moderate to severe aortic insufficiency and very mild aortic stenosis.  There was classical rheumatic mitral valve disease with moderate to severe mitral stenosis and mild mitral regurgitation.  Left  ventricular size and systolic function appeared normal.  There was moderate left atrial enlargement.  Right ventricular size and function was normal.  There was moderate central tricuspid regurgitation with no obvious rheumatic involvement of the tricuspid leaflets or subvalvular apparatus.  The ascending thoracic aorta was dilated.  The patient's chest, abdomen, both groins, and both lower extremities are prepared and draped in a sterile manner. A time out procedure is performed.   Percutaneous Vascular Access:  Percutaneous venous access were obtained on the right side.  Using ultrasound guidance the right common femoral vein was cannulated using the Seldinger technique and single Perclose vascular closure devise was placed, after which time an 8 French sheath inserted.     Surgical Approach:  A median sternotomy incision was performed and the pericardium is opened. The ascending aorta is very enlarged in appearance, measuring greater than 4.0 cm in its greatest transverse diameter.   Extracorporeal Cardiopulmonary Bypass and Myocardial Protection:  A small incision is made in the right deltopectoral groove.  The incision is completed through the subcutaneous tissues with electrocautery.  Longitudinal fibers of the pectoralis major muscle are split longitudinally.  The deep pectoralis fascia is incised.  The head of the pectoralis minor muscle was retracted laterally.  The right axillary artery is dissected away from associated structures.  Of note, the brachial plexus is draped over the top of the axillary artery.  Sharp dissection is utilized and no electrocautery was utilized.  The axillary artery is relatively small but otherwise normal in appearance.  The patient was heparinized systemically.  2 concentric pursestring sutures are placed on the anterior surface of the right axillary artery.  The right axillary artery is cannulated with Seldinger technique and a  guidewire advanced into the  aorta.  The right axillary artery is cannulated with a 16 French femoral arterial cannula.  Care is taken to advance the cannula only 2 cm into the axillary artery.  The right common femoral vein is cannulated through the venous sheath and a guidewire advanced into the right atrium using TEE guidance.  The femoral vein cannulated using a 22 Fr long femoral venous cannula.    Adequate heparinization is verified.   The entire pre-bypass portion of the operation was notable for stable hemodynamics.  Cardiopulmonary bypass was begun and the surface of the heart is inspected.  A second venous cannula is placed directly into the superior vena cava.  A retrograde cardioplegia cannula is placed through the right atrium into the coronary sinus.  The ascending aortic aneurysm is dissected circumferentially.  A cardioplegia cannula is placed in the ascending aorta.  A temperature probe was placed in the interventricular septum.  The patient is cooled to 28C systemic temperature.  The aortic cross clamp is applied and cardioplegia is delivered initially in an antegrade fashion through the aortic root using modified [place] cold blood cardioplegia (Kennestone blood cardioplegia protocol).   The initial cardioplegic arrest is rapid with early diastolic arrest.  Repeat doses of cardioplegia are administered at 90 minutes and every 30 minutes thereafter through the coronary sinus catheter in order to maintain completely flat electrocardiogram.  Myocardial protection was felt to be excellent.   Maze Procedure (left atrial lesion set):  The AtriCure Synergy bipolar radiofrequency ablation clamp is used for all radiofrequency ablation lesions for the maze procedure.  The Atricure CryoICE nitrous oxide cryothermy system is utilized for all cryothermy ablation lesions.   The heart is retracted towards the surgeon's side and the left sided pulmonary veins exposed.  An elliptical ablation lesion is created around the base  of the left sided pulmonary veins.  A similar elliptical lesion was created around the base of the left atrial appendage.  The left atrial appendage was obliterated using an left atrial appendage clip (Atricure Pro245 left atrial clip size 40mm).  The heart was replaced into the pericardial sac.  An elliptical ablation lesion is created around the base of the right sided pulmonary veins.    A left atriotomy incision was performed through the interatrial groove and extended partially across the back wall of the left atrium after opening the oblique sinus inferiorly.  The floor of the left atrium and the mitral valve were exposed using a self-retaining retractor.  The mitral valve was inspected.  The mitral valve is severely diseased with classical rheumatic mitral stenosis.  Both leaflets are severely thickened.  There is severe leaflet calcification.  There is thickening, effusion, and foreshortening of all of the subvalvular apparatus.  A bipolar ablation lesion was placed across the dome of the left atrium from the cephalad apex of the atriotomy incision to reach the cephalad apex of the elliptical lesion around the left sided pulmonary veins.  A similar bipolar lesion was placed across the back wall of the left atrium from the caudad apex of the atriotomy incision to reach the caudad apex of the elliptical lesion around the left sided pulmonary veins, thereby completing a box.  Finally another bipolar lesion was placed across the back wall of the left atrium from the caudad apex of the atriotomy incision towards the posterior mitral valve annulus.  This lesion was completed along the endocardial surface onto the posterior mitral annulus with a 3 minute  duration cryothermy lesion, followed by a second cryothermy lesion along the posterior epicardial surface of the left atrium across the coronary sinus with the probe extending up the epicardial surface of the lateral wall of the right atrium. This completes  the entire left side lesion set of the Cox maze procedure.   Mitral Valve Replacement:  The anterior leaflet of the mitral valve was excised sharply, leaving a small rim of the free margin and the associated primary chords.  The posterior leaflet split in the midline.  The mitral annulus was sized to accept a 29 mm prosthesis.  The left ventricle was irrigated with copious cold saline solution.  Mitral valve replacement was performed using interrupted horizontal mattress 2-0 Ethibond pledgeted sutures with pledgets in the supraannular position.  The remaining portions of the anterior leaflet were incorporated into the suture line laterally, thereby preserving chords to both the anterior and posterior leaflet.  An [facility] Mitral bovine bioprosthetic tissue valve (size 29 mm, model # 7300TFX, serial # [id number]) was implanted uneventfully. All sutures were secured using Cor-knot device.  The valve seated appropriately with care to position the commissure posts away from the left ventricular outflow tract.   The atriotomy was closed using a 2-layer closure of running 3-0 Prolene suture after placing a sump drain across the mitral valve to serve as a left ventricular vent.     Aortic Valve Replacement:  A transverse aortotomy incision was performed 2 cm above the takeoff of the right coronary artery.  The aortic valve was inspected and notable for classical rheumatic disease with leaflet thickening and mild commissural fusion with leaflet retraction.  There was minimal stenosis but the valve was severely incompetent..  The aortic valve leaflets were excised sharply and the aortic annulus decalcified.  Decalcification was notably uneventful.  The aortic annulus was sized to accept a 21 mm prosthesis.  The aortic root and left ventricle were irrigated with copious cold saline solution.  Aortic valve replacement was performed using interrupted horizontal mattress 2-0 Ethibond pledgeted sutures with  pledgets in the subannular position.  An [facility] Ease stented bovine pericardial tissue valve (size 21 mm, model #3300TFX, serial # [id number]) was implanted uneventfully. All sutures were secured using a Cor-knot device.  The valve seated appropriately with adequate space beneath the left main and right coronary artery.   Repair Ascending Thoracic Aortic Aneurysm:  The ascending aorta was transected proximally and the entire ascending thoracic aortic aneurysm resected.  The aorta is transected distally immediately below the cross-clamp.  Just below the cross-clamp the aortic diameter is less than 4 cm in diameter.  The ascending thoracic aorta is replaced using a Hemashield platinum supra coronary straight graft (ref# [id number] P0, serial #[id number]).  Both the proximal and distal suture lines are performed using running 3-0 Prolene suture with Teflon felt strip to buttress the suture lines.   Maze Procedure (right atrial lesion set):  The inferior vena cava cannula was pulled down until the tip was just below the junction between the right atrium and the inferior vena cava. An oblique incision is made in the right atrium. Traction sutures are placed to facilitate exposure of the tricuspid valve. The tricuspid valve is inspected carefully. The tricuspid valve leaflets appear normal with normal mobility and no sign of any fibrosis or thickening.   The AtriCure Synergy bipolar radiofrequency ablation clamp is utilized to create a series of linear lesions in the right atrium, each with one limb of the clamp along the  endocardial surface and the other along the epicardial surface. The first lesion is placed from the posterior apex of the atriotomy incision and along the lateral wall of the right atrium to reach the lateral aspect of the superior vena cava, connecting with the cryothermy lesion placed previously across the coronary sinus and up the epicardial surface of the lateral right atrium.  A  second lesion is placed in the opposite direction from the posterior apex of the atriotomy incision along the lateral wall to reach the lateral aspect of the inferior vena cava. A third lesion is placed from the midportion of the atriotomy incision extending at a right angle to reach the tip of the right atrial appendage. A fourth lesion is placed from the anterior apex of the atriotomy incision in an anterior and inferior direction to reach the acute margin of the heart. Finally, the cryotherapy probe is utilized to complete the right atrial lesion set by placing the probe along the endocardial surface of the right atrium from the anterior apex of the atriotomy incision to reach the tricuspid annulus at the 2:00 position.    Tricuspid Valve Repair:  Tricuspid ring annuloplasty is performed using interrupted 2-0 Ethibond horizontal mattress sutures placed circumferentially around the tricuspid annulus with exception of the area immediately below the triangle of Koch.   After placement of all of the annuloplasty sutures a single dose of warm retrograde "reanimation dose" blood cardioplegia was given and all air was evacuated through a small hole placed on the anterior surface of the aortic graft.  The aortic cross clamp removed after a total cross clamp duration of 186 minutes.  The tricuspid valve was sized to accept a 17mm annuloplasty ring based upon the overall surface area of the combined anterior and posterior leaflets. An [facility] 3 annuloplasty ring (size 34mm, model #4900, serial [id number]) is implanted uneventfully. After completion of the annuloplasty the valve was tested with saline and appears to be competent. The right atriotomy incision is closed using a 2 layer closure of running 4-0 Prolene suture.   Procedure Completion:  Epicardial pacing wires are fixed to the right ventricular outflow tract and to the right atrial appendage. The patient is rewarmed to 37C temperature.  The aortic  graft is carefully inspected for hemostasis.  The left ventricular vent was removed.  The patient is weaned and disconnected from cardiopulmonary bypass.  The patient's rhythm at separation from bypass was atrial paced.  The patient was weaned from cardiopulmonary bypass on low dose dopamine infusion. Total cardiopulmonary bypass time for the operation was 262 minutes.  Followup transesophageal echocardiogram performed after separation from bypass revealed well-seated aortic and mitral prosthetic valves that were functioning normally and without any sign of perivalvular leak.  There was an annuloplasty ring in the tricuspid position with trivial residual tricuspid regurgitation.  Left ventricular function was unchanged from preoperatively.  The superior vena cava cannula was removed uneventfully. Protamine was administered to reverse the anticoagulation.  The axillary artery cannula was removed and the pursestring sutures were secured.  There was a palpable pulse in the distal right axillary artery.  The femoral venous cannula was removed and manual pressure held on the groin for 30 minutes after the Perclose suture was secured.  The mediastinum was inspected for hemostasis and irrigated with saline solution. The mediastinum was drained using 2 chest tubes placed through separate stab incisions inferiorly.  The soft tissues anterior to the aorta were reapproximated loosely. The sternum is closed with double  strength sternal wire. The soft tissues anterior to the sternum were closed in multiple layers and the skin is closed with a running subcuticular skin closure.  The post-bypass portion of the operation was notable for stable rhythm and hemodynamics.  The patient received 2 units packed red blood cells during the procedure due to anemia which was present preoperatively and exacerbated by acute blood loss and hemodilution during cardiopulmonary bypass.  The patient received a total of 1 pack adult platelets  and 2 units fresh frozen plasma due to coagulopathy and thrombocytopenia after separation from cardiopulmonary bypass and reversal of heparin with protamine.   Patient Disposition:  The patient tolerated the procedure well and is transported to the surgical intensive care in stable condition. There are no intraoperative complications. All sponge instrument and needle counts are verified correct at completion of the operation.     Valentina Gu. Roxy Manns MD 09/05/2018 3:46 PM

## 2018-09-05 NOTE — Progress Notes (Signed)
Per verbal order over the phone from Dr. Roxy Manns:  Transfuse x2 units PRBC's & x2 units of FFP. Give x1 amp of calcium, slowly.  May wean from ventilator & extubate if appropriate during the night

## 2018-09-05 NOTE — Anesthesia Postprocedure Evaluation (Signed)
Anesthesia Post Note  Patient: Hannah Roy  Procedure(s) Performed: AORTIC VALVE REPLACEMENT (AVR) USING MAGNA EASE 21MM AOTIC BIOPROSTHESIS VALVE (N/A Chest) MITRAL VALVE (MV) REPLACEMENT USING MAGNA MITRAL EASE 29MM BIOPROSTHESIS VALVE (N/A Chest) MAZE (N/A ) TRICUSPID VALVE REPAIR USING EDWARDS MC3 TRICUSPID ANNULOPLASTY RING SIZE T28 (N/A Chest) SUPRACORONARY STRAIGHT GRAFT REPLACEMENT OF ASCENDING AORTA (N/A ) Clipping Of Atrial Appendage (Chest)     Patient location during evaluation: SICU Anesthesia Type: General Level of consciousness: sedated Pain management: pain level controlled Vital Signs Assessment: post-procedure vital signs reviewed and stable Respiratory status: patient remains intubated per anesthesia plan Cardiovascular status: stable Postop Assessment: no apparent nausea or vomiting Anesthetic complications: no    Last Vitals:  Vitals:   09/05/18 1645 09/05/18 1700  BP:  108/78  Pulse: 80 80  Resp: 12 12  Temp: (!) 36.1 C (!) 36 C  SpO2:  95%    Last Pain:  Vitals:   09/05/18 1600  TempSrc: Core  PainSc:                  Karime Scheuermann

## 2018-09-05 NOTE — Progress Notes (Signed)
[facility] BRIEF SICU PROGRESS NOTE  Day of Surgery  S/P Procedure(s) (LRB): AORTIC VALVE REPLACEMENT (AVR) USING MAGNA EASE 21MM AOTIC BIOPROSTHESIS VALVE (N/A) MITRAL VALVE (MV) REPLACEMENT USING MAGNA MITRAL EASE 29MM BIOPROSTHESIS VALVE (N/A) MAZE (N/A) TRICUSPID VALVE REPAIR USING EDWARDS MC3 TRICUSPID ANNULOPLASTY RING SIZE T28 (N/A) SUPRACORONARY STRAIGHT GRAFT REPLACEMENT OF ASCENDING AORTA (N/A) Clipping Of Atrial Appendage   Sedated on vent AAI paced w/ stable hemodynamics, PA pressures low Chest tube output trending down UOP excellent Labs okay except platelet count 75k and coags prolonged  Plan: Transfuse 1 pack platelets, 2 u FFP and 1 pool cryo Otherwise continue routine early postop   Rexene Alberts, MD 09/05/2018 5:27 PM

## 2018-09-05 NOTE — Anesthesia Procedure Notes (Signed)
Central Venous Catheter Insertion Performed by: Audry Pili, MD, anesthesiologist Start/End5/28/2020 6:41 AM, 09/05/2018 6:55 AM Patient location: Pre-op. Preanesthetic checklist: patient identified, IV checked, risks and benefits discussed, surgical consent, monitors and equipment checked, pre-op evaluation, timeout performed and anesthesia consent Position: Trendelenburg Lidocaine 1% used for infiltration and patient sedated Hand hygiene performed , maximum sterile barriers used  and Seldinger technique used Catheter size: 8.5 Fr Central line was placed.MAC introducer Procedure performed using ultrasound guided technique. Ultrasound Notes:anatomy identified, needle tip was noted to be adjacent to the nerve/plexus identified, no ultrasound evidence of intravascular and/or intraneural injection and image(s) printed for medical record Attempts: 1 Following insertion, line sutured, dressing applied and Biopatch. Post procedure assessment: blood return through all ports, no air and free fluid flow  Patient tolerated the procedure well with no immediate complications.

## 2018-09-05 NOTE — Progress Notes (Signed)
Recruitment maneuver done per Dr. Roxy Manns and rate changed to 18 after.  RT will continue to monitor.

## 2018-09-05 NOTE — Anesthesia Procedure Notes (Signed)
Procedure Name: Intubation Date/Time: 09/05/2018 8:06 AM Performed by: Kathryne Hitch, CRNA Pre-anesthesia Checklist: Patient identified, Emergency Drugs available, Suction available and Patient being monitored Patient Re-evaluated:Patient Re-evaluated prior to induction Oxygen Delivery Method: Circle system utilized Preoxygenation: Pre-oxygenation with 100% oxygen Induction Type: IV induction, Rapid sequence and Cricoid Pressure applied Laryngoscope Size: Miller and 2 Grade View: Grade I Tube type: Oral Tube size: 8.0 mm Number of attempts: 1 Airway Equipment and Method: Stylet Placement Confirmation: ETT inserted through vocal cords under direct vision,  positive ETCO2 and breath sounds checked- equal and bilateral Secured at: 23 cm Tube secured with: Tape Dental Injury: Teeth and Oropharynx as per pre-operative assessment

## 2018-09-05 NOTE — Brief Op Note (Signed)
08/31/2018 - 09/05/2018  3:38 PM  PATIENT:  Hannah Roy  78 y.o. female  PRE-OPERATIVE DIAGNOSIS:  RHEUMATIC AORTIC AND MITRAL VALVE DISEASE  POST-OPERATIVE DIAGNOSIS:  RHEUMATIC AORTIC AND MITRAL VALVE DISEASE  PROCEDURE:  Procedure(s): AORTIC VALVE REPLACEMENT (AVR) USING MAGNA EASE 21MM AOTIC BIOPROSTHESIS VALVE (N/A) MITRAL VALVE (MV) REPLACEMENT USING MAGNA MITRAL EASE 29MM BIOPROSTHESIS VALVE (N/A) MAZE (N/A) TRICUSPID VALVE REPAIR USING EDWARDS MC3 TRICUSPID ANNULOPLASTY RING SIZE T28 (N/A) SUPRACORONARY STRAIGHT GRAFT REPLACEMENT OF ASCENDING AORTA (N/A) Clipping Of Atrial Appendage  SURGEON:  Surgeon(s) and Role:    Rexene Alberts, MD - Primary  PHYSICIAN ASSISTANT:  Nicholes Rough, PA-C   ANESTHESIA:   general  EBL:  1200 mL   BLOOD ADMINISTERED: 2 pRBC, 2 FFP, and 1 platelet  DRAINS: ROUTINE   LOCAL MEDICATIONS USED:  NONE  SPECIMEN:  Source of Specimen:  portion of the mitral valve, aortic valve leaflets, aortic aneurysm   DISPOSITION OF SPECIMEN:  PATHOLOGY  COUNTS:  YES  DICTATION: .Dragon Dictation  PLAN OF CARE: Admit to inpatient   PATIENT DISPOSITION:  ICU - intubated and hemodynamically stable.   Delay start of Pharmacological VTE agent (>24hrs) due to surgical blood loss or risk of bleeding: yes

## 2018-09-05 NOTE — Transfer of Care (Signed)
Immediate Anesthesia Transfer of Care Note  Patient: Hannah Roy  Procedure(s) Performed: AORTIC VALVE REPLACEMENT (AVR) USING MAGNA EASE 21MM AOTIC BIOPROSTHESIS VALVE (N/A Chest) MITRAL VALVE (MV) REPLACEMENT USING MAGNA MITRAL EASE 29MM BIOPROSTHESIS VALVE (N/A Chest) MAZE (N/A ) TRICUSPID VALVE REPAIR USING EDWARDS MC3 TRICUSPID ANNULOPLASTY RING SIZE T28 (N/A Chest) SUPRACORONARY STRAIGHT GRAFT REPLACEMENT OF ASCENDING AORTA (N/A ) Clipping Of Atrial Appendage (Chest)  Patient Location: SICU  Anesthesia Type:General  Level of Consciousness: Patient remains intubated per anesthesia plan  Airway & Oxygen Therapy: Patient remains intubated per anesthesia plan and Patient placed on Ventilator (see vital sign flow sheet for setting)  Post-op Assessment: Report given to RN and Post -op Vital signs reviewed and stable  Post vital signs: stable  Last Vitals:  Vitals Value Taken Time  BP    Temp    Pulse    Resp    SpO2      Last Pain:  Vitals:   09/05/18 0449  TempSrc: Oral  PainSc:       Patients Stated Pain Goal: 0 (24/23/53 6144)  Complications: No apparent anesthesia complications

## 2018-09-05 NOTE — Anesthesia Procedure Notes (Signed)
Central Venous Catheter Insertion Performed by: Audry Pili, MD, anesthesiologist Start/End5/28/2020 6:51 AM, 09/05/2018 6:55 AM Patient location: Pre-op. Preanesthetic checklist: patient identified, IV checked, risks and benefits discussed, surgical consent, monitors and equipment checked, pre-op evaluation, timeout performed and anesthesia consent Position: Trendelenburg Hand hygiene performed  and maximum sterile barriers used  Total catheter length 10. PA cath was placed.Swan type:thermodilution PA Cath depth:50 Procedure performed without using ultrasound guided technique. Attempts: 1 Patient tolerated the procedure well with no immediate complications.

## 2018-09-05 NOTE — Anesthesia Procedure Notes (Signed)
Arterial Line Insertion Start/End5/28/2020 7:20 AM Performed by: Wilburn Cornelia, CRNA, CRNA  Patient location: Pre-op. Lidocaine 1% used for infiltration Left, radial was placed Catheter size: 20 G Hand hygiene performed  and maximum sterile barriers used   Attempts: 3 Procedure performed without using ultrasound guided technique. Following insertion, dressing applied and Biopatch. Post procedure assessment: normal  Patient tolerated the procedure well with no immediate complications.

## 2018-09-05 NOTE — Anesthesia Preprocedure Evaluation (Addendum)
Anesthesia Evaluation  Patient identified by MRN, date of birth, ID band Patient awake    Reviewed: Allergy & Precautions, NPO status , Patient's Chart, lab work & pertinent test results, reviewed documented beta blocker date and time   History of Anesthesia Complications Negative for: history of anesthetic complications  Airway Mallampati: II  TM Distance: >3 FB Neck ROM: Full    Dental  (+) Dental Advisory Given, Teeth Intact   Pulmonary former smoker,    breath sounds clear to auscultation       Cardiovascular hypertension, Pt. on medications and Pt. on home beta blockers + Valvular Problems/Murmurs MR, AS and AI  Rhythm:Irregular Rate:Normal + Systolic murmurs and + Diastolic murmurs    Neuro/Psych negative neurological ROS  negative psych ROS   GI/Hepatic negative GI ROS, Neg liver ROS,   Endo/Other  Hypothyroidism   Renal/GU negative Renal ROS     Musculoskeletal  (+) Arthritis ,   Abdominal   Peds  Hematology negative hematology ROS (+)   Anesthesia Other Findings   Reproductive/Obstetrics                            Anesthesia Physical Anesthesia Plan  ASA: IV  Anesthesia Plan: General   Post-op Pain Management:    Induction: Intravenous  PONV Risk Score and Plan: 3 and Treatment may vary due to age or medical condition  Airway Management Planned: Oral ETT  Additional Equipment: Arterial line, CVP, PA Cath, TEE and Ultrasound Guidance Line Placement  Intra-op Plan:   Post-operative Plan: Post-operative intubation/ventilation  Informed Consent: I have reviewed the patients History and Physical, chart, labs and discussed the procedure including the risks, benefits and alternatives for the proposed anesthesia with the patient or authorized representative who has indicated his/her understanding and acceptance.     Dental advisory given  Plan Discussed with: CRNA and  Anesthesiologist  Anesthesia Plan Comments:         Anesthesia Quick Evaluation

## 2018-09-05 NOTE — Progress Notes (Signed)
      [address]       [place] [address]             [phone]     CARDIOTHORACIC SURGERY PROGRESS NOTE  Subjective: Hannah Roy has been scheduled for Procedure(s): AORTIC VALVE REPLACEMENT (AVR) (N/A) MITRAL VALVE (MV) REPLACEMENT (N/A) MAZE (N/A) possible TRICUSPID VALVE REPAIR (N/A) possible plication or REPLACEMENT ASCENDING AORTA (N/A) today.   Objective: Vital signs in last 24 hours: Temp:  [97.7 F (36.5 C)-97.9 F (36.6 C)] 97.7 F (36.5 C) (05/28 0449) Pulse Rate:  [74-84] 74 (05/28 0449) Cardiac Rhythm: Atrial fibrillation;Atrial flutter (05/27 1945) Resp:  [20] 20 (05/28 0449) BP: (94-108)/(63-76) 94/63 (05/28 0449) SpO2:  [97 %-98 %] 97 % (05/28 0449) Weight:  [54.1 kg] 54.1 kg (05/28 0438)  Physical Exam: Unchanged from previously   Intake/Output from previous day: 05/27 0701 - 05/28 0700 In: 720 [P.O.:720] Out: -  Intake/Output this shift: Total I/O In: 240 [P.O.:240] Out: -   Lab Results: Recent Labs    09/03/18 0443 09/04/18 0327  WBC 5.2 5.2  HGB 12.8 12.4  HCT 37.5 36.7  PLT 229 232   BMET:  Recent Labs    09/04/18 0327  NA 140  K 3.7  CL 108  CO2 22  GLUCOSE 98  BUN 18  CREATININE 0.91  CALCIUM 9.3    CBG (last 3)  No results for input(s): GLUCAP in the last 72 hours. PT/INR:  No results for input(s): LABPROT, INR in the last 72 hours.  Assessment/Plan:   The various methods of treatment have been discussed with the patient. After consideration of the risks, benefits and treatment options the patient has consented to the planned procedure.   The patient has been seen and labs reviewed. There are no changes in the patient's condition to prevent proceeding with the planned procedure today.   Hannah Alberts, MD 09/05/2018 6:03 AM

## 2018-09-05 NOTE — Progress Notes (Signed)
Verbal orders over the phone from Dr. Roxy Manns to increase Milrinone gtt to 0.57mcg/kg/min. Begin weaning off Dopamine gtt & continue to titrate other gtt's as needed to maintain MAP appropriately.

## 2018-09-05 NOTE — Progress Notes (Signed)
Spoke w/ pt's daughter on the phone who requested an update. Pt's daughter tearful & anxious but compliant with information. This RN promised to update daughter if any changes occur w/ pt's status.

## 2018-09-06 ENCOUNTER — Inpatient Hospital Stay (HOSPITAL_COMMUNITY): Payer: Medicare Other

## 2018-09-06 ENCOUNTER — Encounter (HOSPITAL_COMMUNITY): Payer: Self-pay | Admitting: Thoracic Surgery (Cardiothoracic Vascular Surgery)

## 2018-09-06 DIAGNOSIS — Z953 Presence of xenogenic heart valve: Secondary | ICD-10-CM

## 2018-09-06 LAB — BPAM PLATELET PHERESIS
Blood Product Expiration Date: 202005282359
Blood Product Expiration Date: 202005282359
ISSUE DATE / TIME: 202005281331
ISSUE DATE / TIME: 202005281742
Unit Type and Rh: 6200
Unit Type and Rh: 6200

## 2018-09-06 LAB — PREPARE PLATELET PHERESIS
Unit division: 0
Unit division: 0

## 2018-09-06 LAB — MAGNESIUM
Magnesium: 2.7 mg/dL — ABNORMAL HIGH (ref 1.7–2.4)
Magnesium: 2.2 mg/dL (ref 1.7–2.4)

## 2018-09-06 LAB — BASIC METABOLIC PANEL
Anion gap: 7 (ref 5–15)
Anion gap: 10 (ref 5–15)
BUN: 13 mg/dL (ref 8–23)
BUN: 10 mg/dL (ref 8–23)
CO2: 21 mmol/L — ABNORMAL LOW (ref 22–32)
CO2: 25 mmol/L (ref 22–32)
Calcium: 7.6 mg/dL — ABNORMAL LOW (ref 8.9–10.3)
Calcium: 8.2 mg/dL — ABNORMAL LOW (ref 8.9–10.3)
Chloride: 113 mmol/L — ABNORMAL HIGH (ref 98–111)
Chloride: 104 mmol/L (ref 98–111)
Creatinine, Ser: 0.75 mg/dL (ref 0.44–1.00)
Creatinine, Ser: 0.97 mg/dL (ref 0.44–1.00)
GFR calc Af Amer: >60 mL/min (ref >60)
GFR calc Af Amer: >60 mL/min (ref >60)
GFR calc non Af Amer: >60 mL/min (ref >60)
GFR calc non Af Amer: 57 mL/min — ABNORMAL LOW (ref >60)
Glucose, Bld: 106 mg/dL — ABNORMAL HIGH (ref 70–99)
Glucose, Bld: 134 mg/dL — ABNORMAL HIGH (ref 70–99)
Potassium: 4.8 mmol/L (ref 3.5–5.1)
Potassium: 3.6 mmol/L (ref 3.5–5.1)
Sodium: 141 mmol/L (ref 135–145)
Sodium: 139 mmol/L (ref 135–145)

## 2018-09-06 LAB — BPAM FFP
Blood Product Expiration Date: 202005312359
Blood Product Expiration Date: 202005312359
Blood Product Expiration Date: 202005312359
Blood Product Expiration Date: 202006012359
ISSUE DATE / TIME: 202005281330
ISSUE DATE / TIME: 202005281330
ISSUE DATE / TIME: 202005281738
ISSUE DATE / TIME: 202005281738
Unit Type and Rh: 6200
Unit Type and Rh: 6200
Unit Type and Rh: 6200
Unit Type and Rh: 6200

## 2018-09-06 LAB — PREPARE FRESH FROZEN PLASMA
Unit division: 0
Unit division: 0
Unit division: 0

## 2018-09-06 LAB — POCT I-STAT 7, (LYTES, BLD GAS, ICA,H+H)
Acid-base deficit: 3 mmol/L — ABNORMAL HIGH (ref 0.0–2.0)
Acid-base deficit: 4 mmol/L — ABNORMAL HIGH (ref 0.0–2.0)
Acid-base deficit: 3 mmol/L — ABNORMAL HIGH (ref 0.0–2.0)
Bicarbonate: 22 mmol/L (ref 20.0–28.0)
Bicarbonate: 21 mmol/L (ref 20.0–28.0)
Bicarbonate: 22.3 mmol/L (ref 20.0–28.0)
Calcium, Ion: 0.89 mmol/L — CL (ref 1.15–1.40)
Calcium, Ion: 1.04 mmol/L — ABNORMAL LOW (ref 1.15–1.40)
Calcium, Ion: 1.1 mmol/L — ABNORMAL LOW (ref 1.15–1.40)
HCT: 26 % — ABNORMAL LOW (ref 36.0–46.0)
HCT: 24 % — ABNORMAL LOW (ref 36.0–46.0)
HCT: 23 % — ABNORMAL LOW (ref 36.0–46.0)
Hemoglobin: 8.8 g/dL — ABNORMAL LOW (ref 12.0–15.0)
Hemoglobin: 8.2 g/dL — ABNORMAL LOW (ref 12.0–15.0)
Hemoglobin: 7.8 g/dL — ABNORMAL LOW (ref 12.0–15.0)
O2 Saturation: 98 %
O2 Saturation: 100 %
O2 Saturation: 99 %
Patient temperature: 36
Patient temperature: 37.3
Patient temperature: 37.4
Potassium: 4 mmol/L (ref 3.5–5.1)
Potassium: 4.8 mmol/L (ref 3.5–5.1)
Potassium: 4.6 mmol/L (ref 3.5–5.1)
Sodium: 145 mmol/L (ref 135–145)
Sodium: 142 mmol/L (ref 135–145)
Sodium: 142 mmol/L (ref 135–145)
TCO2: 23 mmol/L (ref 22–32)
TCO2: 22 mmol/L (ref 22–32)
TCO2: 23 mmol/L (ref 22–32)
pCO2 arterial: 38.4 mmHg (ref 32.0–48.0)
pCO2 arterial: 37.6 mmHg (ref 32.0–48.0)
pCO2 arterial: 38.9 mmHg (ref 32.0–48.0)
pH, Arterial: 7.362 (ref 7.350–7.450)
pH, Arterial: 7.357 (ref 7.350–7.450)
pH, Arterial: 7.367 (ref 7.350–7.450)
pO2, Arterial: 109 mmHg — ABNORMAL HIGH (ref 83.0–108.0)
pO2, Arterial: 176 mmHg — ABNORMAL HIGH (ref 83.0–108.0)
pO2, Arterial: 139 mmHg — ABNORMAL HIGH (ref 83.0–108.0)

## 2018-09-06 LAB — CBC
HCT: 26.3 % — ABNORMAL LOW (ref 36.0–46.0)
HCT: 26.1 % — ABNORMAL LOW (ref 36.0–46.0)
Hemoglobin: 8.9 g/dL — ABNORMAL LOW (ref 12.0–15.0)
Hemoglobin: 9 g/dL — ABNORMAL LOW (ref 12.0–15.0)
MCH: 31.1 pg (ref 26.0–34.0)
MCH: 31.5 pg (ref 26.0–34.0)
MCHC: 33.8 g/dL (ref 30.0–36.0)
MCHC: 34.5 g/dL (ref 30.0–36.0)
MCV: 92 fL (ref 80.0–100.0)
MCV: 91.3 fL (ref 80.0–100.0)
Platelets: 55 10*3/uL — ABNORMAL LOW (ref 150–400)
Platelets: 60 10*3/uL — ABNORMAL LOW (ref 150–400)
RBC: 2.86 MIL/uL — ABNORMAL LOW (ref 3.87–5.11)
RBC: 2.86 MIL/uL — ABNORMAL LOW (ref 3.87–5.11)
RDW: 13.9 % (ref 11.5–15.5)
RDW: 14.5 % (ref 11.5–15.5)
WBC: 7.4 10*3/uL (ref 4.0–10.5)
WBC: 9.3 10*3/uL (ref 4.0–10.5)
nRBC: 0 % (ref 0.0–0.2)
nRBC: 0 % (ref 0.0–0.2)

## 2018-09-06 LAB — GLUCOSE, CAPILLARY
Glucose-Capillary: 101 mg/dL — ABNORMAL HIGH (ref 70–99)
Glucose-Capillary: 102 mg/dL — ABNORMAL HIGH (ref 70–99)
Glucose-Capillary: 105 mg/dL — ABNORMAL HIGH (ref 70–99)
Glucose-Capillary: 109 mg/dL — ABNORMAL HIGH (ref 70–99)
Glucose-Capillary: 111 mg/dL — ABNORMAL HIGH (ref 70–99)
Glucose-Capillary: 113 mg/dL — ABNORMAL HIGH (ref 70–99)
Glucose-Capillary: 118 mg/dL — ABNORMAL HIGH (ref 70–99)
Glucose-Capillary: 118 mg/dL — ABNORMAL HIGH (ref 70–99)
Glucose-Capillary: 118 mg/dL — ABNORMAL HIGH (ref 70–99)
Glucose-Capillary: 119 mg/dL — ABNORMAL HIGH (ref 70–99)
Glucose-Capillary: 123 mg/dL — ABNORMAL HIGH (ref 70–99)
Glucose-Capillary: 123 mg/dL — ABNORMAL HIGH (ref 70–99)
Glucose-Capillary: 123 mg/dL — ABNORMAL HIGH (ref 70–99)
Glucose-Capillary: 124 mg/dL — ABNORMAL HIGH (ref 70–99)
Glucose-Capillary: 126 mg/dL — ABNORMAL HIGH (ref 70–99)
Glucose-Capillary: 129 mg/dL — ABNORMAL HIGH (ref 70–99)
Glucose-Capillary: 131 mg/dL — ABNORMAL HIGH (ref 70–99)
Glucose-Capillary: 132 mg/dL — ABNORMAL HIGH (ref 70–99)
Glucose-Capillary: 140 mg/dL — ABNORMAL HIGH (ref 70–99)
Glucose-Capillary: 93 mg/dL (ref 70–99)

## 2018-09-06 LAB — BPAM CRYOPRECIPITATE
Blood Product Expiration Date: 202005282340
ISSUE DATE / TIME: 202005281757
Unit Type and Rh: 5100

## 2018-09-06 LAB — PREPARE CRYOPRECIPITATE: Unit division: 0

## 2018-09-06 MED ORDER — AMIODARONE HCL 200 MG PO TABS
200.0000 mg | ORAL_TABLET | Freq: Two times a day (BID) | ORAL | Status: DC
  Administered 2018-09-07 (×2): 200 mg via ORAL
  Filled 2018-09-06 (×3): qty 1

## 2018-09-06 MED ORDER — INSULIN ASPART 100 UNIT/ML ~~LOC~~ SOLN
0.0000 [IU] | SUBCUTANEOUS | Status: DC
  Administered 2018-09-06 – 2018-09-07 (×4): 2 [IU] via SUBCUTANEOUS

## 2018-09-06 MED ORDER — WARFARIN - PHYSICIAN DOSING INPATIENT
Freq: Every day | Status: DC
  Administered 2018-09-06 – 2018-09-10 (×5)

## 2018-09-06 MED ORDER — POTASSIUM CHLORIDE 10 MEQ/50ML IV SOLN
10.0000 meq | INTRAVENOUS | Status: AC
  Administered 2018-09-06 (×3): 10 meq via INTRAVENOUS
  Filled 2018-09-06 (×3): qty 50

## 2018-09-06 MED ORDER — PATIENT'S GUIDE TO USING COUMADIN BOOK
Freq: Once | Status: AC
  Administered 2018-09-06: 17:00:00
  Filled 2018-09-06: qty 1

## 2018-09-06 MED ORDER — METOPROLOL TARTRATE 12.5 MG HALF TABLET
12.5000 mg | ORAL_TABLET | Freq: Two times a day (BID) | ORAL | Status: DC
  Administered 2018-09-06 – 2018-09-07 (×2): 12.5 mg via ORAL
  Filled 2018-09-06 (×2): qty 1

## 2018-09-06 MED ORDER — INSULIN ASPART 100 UNIT/ML ~~LOC~~ SOLN: 0.0000 [IU] | SUBCUTANEOUS | Status: DC

## 2018-09-06 MED ORDER — WARFARIN SODIUM 2.5 MG PO TABS
2.5000 mg | ORAL_TABLET | Freq: Every day | ORAL | Status: DC
  Administered 2018-09-06 – 2018-09-07 (×2): 2.5 mg via ORAL
  Filled 2018-09-06 (×2): qty 1

## 2018-09-06 MED ORDER — FUROSEMIDE 10 MG/ML IJ SOLN
8.0000 mg/h | INTRAVENOUS | Status: DC
  Administered 2018-09-06: 8 mg/h via INTRAVENOUS
  Filled 2018-09-06: qty 25
  Filled 2018-09-06: qty 21

## 2018-09-06 MED FILL — Potassium Chloride Inj 2 mEq/ML: INTRAVENOUS | Qty: 40 | Status: AC

## 2018-09-06 MED FILL — Dexmedetomidine HCl in NaCl 0.9% IV Soln 400 MCG/100ML: INTRAVENOUS | Qty: 100 | Status: AC

## 2018-09-06 MED FILL — Heparin Sodium (Porcine) Inj 1000 Unit/ML: INTRAMUSCULAR | Qty: 30 | Status: AC

## 2018-09-06 MED FILL — Magnesium Sulfate Inj 50%: INTRAMUSCULAR | Qty: 10 | Status: AC

## 2018-09-06 NOTE — Progress Notes (Signed)
RT reassessed pt for second phase of heart wean. Pt able to hold head off of pillow for 20 seconds, follow commands when asked to complete a task. Heart wean continued with PSV/CPAP 10/5 with FIO2 of 40%. After 20 minutes RN will obtain ABG to assess pts readiness for extubation. Pt tolerating well and stable at this time. RT will continue to monitor.

## 2018-09-06 NOTE — Progress Notes (Signed)
Patient ID: Hannah Roy, female   DOB: 03/16/1941, 78 y.o.   MRN: [id number] TCTS Evening Rounds  Hemodynamically stable in sinus rhythm.  sats 98%  Urine output ok. Lasix drip started today.  CT output 300 cc since this am.  Hgb stable at 9.0 Plts stable at 60 K  CBC    Component Value Date/Time   WBC 9.3 09/06/2018 1554   RBC 2.86 (L) 09/06/2018 1554   HGB 9.0 (L) 09/06/2018 1554   HGB 12.4 01/24/2017 1152   HCT 26.1 (L) 09/06/2018 1554   HCT 38.4 01/24/2017 1152   PLT 60 (L) 09/06/2018 1554   PLT 231 01/24/2017 1152   MCV 91.3 09/06/2018 1554   MCV 95 01/24/2017 1152   MCH 31.5 09/06/2018 1554   MCHC 34.5 09/06/2018 1554   RDW 14.5 09/06/2018 1554   RDW 13.1 01/24/2017 1152   LYMPHSABS 2.1 08/09/2018 1030   LYMPHSABS 1.9 01/24/2017 1152   MONOABS 0.6 08/09/2018 1030   EOSABS 0.2 08/09/2018 1030   EOSABS 0.2 01/24/2017 1152   BASOSABS 0.0 08/09/2018 1030   BASOSABS 0.0 01/24/2017 1152    BMET pending this pm.  She ambulating a little today.

## 2018-09-06 NOTE — Progress Notes (Signed)
Assisted tele visit to patient with daughter.  Chanay Nugent Ann, RN  

## 2018-09-06 NOTE — Progress Notes (Signed)
Pt VC .90 NIF -25 Pt had very good effort with each attempt.  RT will continue to monitor.

## 2018-09-06 NOTE — Progress Notes (Signed)
RT assessed pts readiness to wean. Pt is able to hold head off of pillow w/out difficulty for 20 seconds, able to follow instruction/commands. Heart wean started with PRVC/SIMV/PSV VT 380, RR 4, FIO2 40%, PS above PEEP of 10 per protocol. Pt stable and tolerating well. RT will continue to assess for next step in weaning process.

## 2018-09-06 NOTE — Procedures (Signed)
Extubation Procedure Note  Patient Details:   Name: Hannah Roy DOB: September 25, 1940 MRN: [id number]   Airway Documentation:    Vent end date: 09/06/18 Vent end time: 0635  Evaluation  O2 sats: stable throughout Complications: No apparent complications Patient did tolerate procedure well. Bilateral Breath Sounds: Clear, Diminished   Yes  Pt able NIF -25, VC .90L. Pt was positive for cuff leak. ABG within acceptable limits for extubation, no stridor noted. Pt able to verbalize her name post extubation. Pt stable and doing well. Pt on 4Lpm [place] w/bubble humidifier in place.   Roby Lofts Mckenize Mezera 09/06/2018, 6:40 AM

## 2018-09-06 NOTE — Progress Notes (Signed)
[address]       [place] [address]             [phone]        CARDIOTHORACIC SURGERY PROGRESS NOTE   R1 Day Post-Op Procedure(s) (LRB): AORTIC VALVE REPLACEMENT (AVR) USING MAGNA EASE 21MM AOTIC BIOPROSTHESIS VALVE (N/A) MITRAL VALVE (MV) REPLACEMENT USING MAGNA MITRAL EASE 29MM BIOPROSTHESIS VALVE (N/A) MAZE (N/A) TRICUSPID VALVE REPAIR USING EDWARDS MC3 TRICUSPID ANNULOPLASTY RING SIZE T28 (N/A) SUPRACORONARY STRAIGHT GRAFT REPLACEMENT OF ASCENDING AORTA (N/A) Clipping Of Atrial Appendage  Subjective: Looks good.  Just extubated at 6:40 this morning.  Denies pain or SOB.  Feels weak.  Objective: Vital signs: BP Readings from Last 1 Encounters:  09/06/18 125/79   Pulse Readings from Last 1 Encounters:  09/06/18 75   Resp Readings from Last 1 Encounters:  09/06/18 19   Temp Readings from Last 1 Encounters:  09/06/18 99.5 F (37.5 C)    Hemodynamics: PAP: (23-48)/(12-28) 36/18 CO:  [2.3 L/min-3.5 L/min] 3.1 L/min CI:  [1.5 L/min/m2-2.3 L/min/m2] 2 L/min/m2  Physical Exam:  Rhythm:   sinus  Breath sounds: clear  Heart sounds:  RRR w/out murmur  Incisions:  Dressings dry, intact  Abdomen:  Soft, non-distended, non-tender  Extremities:  Warm, well-perfused  Chest tubes:  low volume thin serosanguinous output, no air leak    Intake/Output from previous day: 05/28 0701 - 05/29 0700 In: 8687.8 [I.V.:3239.1; Blood:4083.8; NG/GT:40; IV Piggyback:1324.9] Out: 6578 [Urine:4470; Emesis/NG output:300; Blood:1200; Chest Tube:1180] Intake/Output this shift: No intake/output data recorded.  Lab Results:  CBC: Recent Labs    09/05/18 2125  09/06/18 0400 09/06/18 0410 09/06/18 0627  WBC 7.8  --  7.4  --   --   HGB 6.7*   < > 8.9* 8.2* 7.8*  HCT 20.0*   < > 26.3* 24.0* 23.0*  PLT 61*  --  55*  --   --    < > = values in this interval not displayed.    BMET:  Recent Labs    09/05/18 0344  09/05/18 2141 09/06/18 0400 09/06/18  0410 09/06/18 0627  NA 141   < > 143 141 142 142  K 4.0   < > 3.6 4.8 4.8 4.6  CL 105   < > 106 113*  --   --   CO2 28  --   --  21*  --   --   GLUCOSE 94   < > 137* 106*  --   --   BUN 16   < > 11 13  --   --   CREATININE 0.95   < > 0.60 0.75  --   --   CALCIUM 9.6  --   --  7.6*  --   --    < > = values in this interval not displayed.     PT/INR:   Recent Labs    09/05/18 1618  LABPROT 22.4*  INR 2.0*    CBG (last 3)  Recent Labs    09/06/18 0403 09/06/18 0456 09/06/18 0600  GLUCAP 102* 105* 93    ABG    Component Value Date/Time   PHART 7.367 09/06/2018 0627   PCO2ART 38.9 09/06/2018 0627   PO2ART 139.0 (H) 09/06/2018 0627   HCO3 22.3 09/06/2018 0627   TCO2 23 09/06/2018 0627   ACIDBASEDEF 3.0 (H) 09/06/2018 0627   O2SAT 99.0 09/06/2018 0627    CXR: PORTABLE CHEST 1 VIEW  COMPARISON:  09/05/2018.  FINDINGS: Endotracheal tube 6 cm above the carina. NG tube in stable position. Mediastinal drainage catheter stable position. Swan-Ganz catheter stable position. Stable cardiomegaly. Prior cardiac valve replacements. Left atrial with appendage clip noted. Surgical clips noted the upper chest. Mediastinal/aortic contour stable. Persistent atelectasis left lung base. No prominent pleural effusion or pneumothorax.  IMPRESSION: 1. Endotracheal tube tip 6 cm above the carina. Remaining lines and tubes in stable position.  2.  Persistent atelectasis left lower lobe.  3. Stable postsurgical changes prior cardiac valve replacements. Stable cardiomegaly.   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   Assessment/Plan: S/P Procedure(s) (LRB): AORTIC VALVE REPLACEMENT (AVR) USING MAGNA EASE 21MM AOTIC BIOPROSTHESIS VALVE (N/A) MITRAL VALVE (MV) REPLACEMENT USING MAGNA MITRAL EASE 29MM BIOPROSTHESIS VALVE (N/A) MAZE (N/A) TRICUSPID VALVE REPAIR USING EDWARDS  MC3 TRICUSPID ANNULOPLASTY RING SIZE T28 (N/A) SUPRACORONARY STRAIGHT GRAFT REPLACEMENT OF ASCENDING AORTA (N/A) Clipping Of Atrial Appendage  Looks good POD1 Maintaining NSR w/ stable hemodynamics on milrinone 0.3 and NTG for hypertension Breathing comfortably w/ O2 sats 100% on nasal cannula, CXR looks good Acute on chronic diastolic CHF with expected post-op volume excess, weight 10 kg > preop Expected post op acute blood loss anemia, Hgb 8.5 Post op thrombocytopenia, platelet count 55k   Mobilize  D/C lines  Continue milrinone for now and wean NTG off as BP allows  Start lasix drip  Leave chest tubes in for now  Restart metoprolol reduced dose  Restart amiodarone tomorrow  Restart ARB in 1-2 days if renal function stable  SCD boots for DVT prophylaxis, no lovenox due to thrombocytopenia  Start Coumadin   Rexene Alberts, MD 09/06/2018 8:02 AM

## 2018-09-07 ENCOUNTER — Inpatient Hospital Stay (HOSPITAL_COMMUNITY): Payer: Medicare Other

## 2018-09-07 LAB — BPAM FFP
Blood Product Expiration Date: 202006012359
Blood Product Expiration Date: 202006022359
ISSUE DATE / TIME: 202005290235
ISSUE DATE / TIME: 202005290235
Unit Type and Rh: 6200
Unit Type and Rh: 6200

## 2018-09-07 LAB — CBC
HCT: 27.5 % — ABNORMAL LOW (ref 36.0–46.0)
Hemoglobin: 9.4 g/dL — ABNORMAL LOW (ref 12.0–15.0)
MCH: 31.3 pg (ref 26.0–34.0)
MCHC: 34.2 g/dL (ref 30.0–36.0)
MCV: 91.7 fL (ref 80.0–100.0)
Platelets: 63 10*3/uL — ABNORMAL LOW (ref 150–400)
RBC: 3 MIL/uL — ABNORMAL LOW (ref 3.87–5.11)
RDW: 14.5 % (ref 11.5–15.5)
WBC: 10.9 10*3/uL — ABNORMAL HIGH (ref 4.0–10.5)
nRBC: 0 % (ref 0.0–0.2)

## 2018-09-07 LAB — PREPARE FRESH FROZEN PLASMA
Unit division: 0
Unit division: 0

## 2018-09-07 LAB — BASIC METABOLIC PANEL
Anion gap: 12 (ref 5–15)
BUN: 10 mg/dL (ref 8–23)
CO2: 29 mmol/L (ref 22–32)
Calcium: 8.2 mg/dL — ABNORMAL LOW (ref 8.9–10.3)
Chloride: 97 mmol/L — ABNORMAL LOW (ref 98–111)
Creatinine, Ser: 0.9 mg/dL (ref 0.44–1.00)
GFR calc Af Amer: >60 mL/min (ref >60)
GFR calc non Af Amer: >60 mL/min (ref >60)
Glucose, Bld: 128 mg/dL — ABNORMAL HIGH (ref 70–99)
Potassium: 3.3 mmol/L — ABNORMAL LOW (ref 3.5–5.1)
Sodium: 138 mmol/L (ref 135–145)

## 2018-09-07 LAB — PROTIME-INR
INR: 1.1 (ref 0.8–1.2)
Prothrombin Time: 13.7 seconds (ref 11.4–15.2)

## 2018-09-07 LAB — GLUCOSE, CAPILLARY
Glucose-Capillary: 121 mg/dL — ABNORMAL HIGH (ref 70–99)
Glucose-Capillary: 125 mg/dL — ABNORMAL HIGH (ref 70–99)
Glucose-Capillary: 141 mg/dL — ABNORMAL HIGH (ref 70–99)
Glucose-Capillary: 105 mg/dL — ABNORMAL HIGH (ref 70–99)

## 2018-09-07 MED ORDER — POTASSIUM CHLORIDE 10 MEQ/50ML IV SOLN
10.0000 meq | INTRAVENOUS | Status: AC
  Administered 2018-09-07 (×5): 10 meq via INTRAVENOUS
  Filled 2018-09-07 (×5): qty 50

## 2018-09-07 MED ORDER — METOCLOPRAMIDE HCL 5 MG/ML IJ SOLN
10.0000 mg | Freq: Four times a day (QID) | INTRAMUSCULAR | Status: AC
  Administered 2018-09-07 – 2018-09-08 (×4): 10 mg via INTRAVENOUS
  Filled 2018-09-07 (×4): qty 2

## 2018-09-07 MED ORDER — FUROSEMIDE 10 MG/ML IJ SOLN
40.0000 mg | Freq: Once | INTRAMUSCULAR | Status: AC
  Administered 2018-09-07: 40 mg via INTRAVENOUS
  Filled 2018-09-07: qty 4

## 2018-09-07 MED ORDER — TRAMADOL HCL 50 MG PO TABS
50.0000 mg | ORAL_TABLET | ORAL | Status: DC | PRN
  Administered 2018-09-08 – 2018-09-09 (×3): 50 mg via ORAL
  Filled 2018-09-07 (×3): qty 1

## 2018-09-07 MED ORDER — POTASSIUM CHLORIDE CRYS ER 20 MEQ PO TBCR
20.0000 meq | EXTENDED_RELEASE_TABLET | ORAL | Status: AC
  Administered 2018-09-07: 20 meq via ORAL
  Filled 2018-09-07 (×2): qty 1

## 2018-09-07 NOTE — Progress Notes (Signed)
Assisted tele visit to patient with family.  Marra Fraga P, RN  

## 2018-09-07 NOTE — Progress Notes (Signed)
2 Days Post-Op Procedure(s) (LRB): AORTIC VALVE REPLACEMENT (AVR) USING MAGNA EASE 21MM AOTIC BIOPROSTHESIS VALVE (N/A) MITRAL VALVE (MV) REPLACEMENT USING MAGNA MITRAL EASE 29MM BIOPROSTHESIS VALVE (N/A) MAZE (N/A) TRICUSPID VALVE REPAIR USING EDWARDS MC3 TRICUSPID ANNULOPLASTY RING SIZE T28 (N/A) SUPRACORONARY STRAIGHT GRAFT REPLACEMENT OF ASCENDING AORTA (N/A) Clipping Of Atrial Appendage Subjective:  Complains of nausea, hoarse voice and swallowing not normal yet. Taking pills with apple sauce.   Objective: Vital signs in last 24 hours: Temp:  [98.7 F (37.1 C)-99.1 F (37.3 C)] 99 F (37.2 C) (05/30 0803) Pulse Rate:  [68-90] 79 (05/30 0900) Cardiac Rhythm: Normal sinus rhythm (05/30 0800) Resp:  [6-22] 18 (05/30 0900) BP: (87-139)/(61-87) 106/78 (05/30 0900) SpO2:  [91 %-100 %] 97 % (05/30 0900) Weight:  [56.6 kg] 56.6 kg (05/30 0500)  Hemodynamic parameters for last 24 hours:    Intake/Output from previous day: 05/29 0701 - 05/30 0700 In: 803.2 [I.V.:421.5; IV Piggyback:381.8] Out: 5800 [Urine:5150; Chest Tube:650] Intake/Output this shift: Total I/O In: 93.8 [I.V.:22.5; IV Piggyback:71.3] Out: 75 [Urine:75]  General appearance: alert and cooperative Neurologic: intact Heart: regular rate and rhythm, S1, S2 normal, no murmur Lungs: clear to auscultation bilaterally Abdomen: soft, non-tender; bowel sounds present Extremities: extremities have no edema Wound: dressings dry  Lab Results: Recent Labs    09/06/18 1554 09/07/18 0400  WBC 9.3 10.9*  HGB 9.0* 9.4*  HCT 26.1* 27.5*  PLT 60* 63*   BMET:  Recent Labs    09/06/18 1554 09/07/18 0400  NA 139 138  K 3.6 3.3*  CL 104 97*  CO2 25 29  GLUCOSE 134* 128*  BUN 10 10  CREATININE 0.97 0.90  CALCIUM 8.2* 8.2*    PT/INR:  Recent Labs    09/07/18 0400  LABPROT 13.7  INR 1.1   ABG    Component Value Date/Time   PHART 7.367 09/06/2018 0627   HCO3 22.3 09/06/2018 0627   TCO2 23 09/06/2018  0627   ACIDBASEDEF 3.0 (H) 09/06/2018 0627   O2SAT 99.0 09/06/2018 0627   CBG (last 3)  Recent Labs    09/06/18 2346 09/07/18 0338 09/07/18 0804  GLUCAP 111* 121* 125*   CXR: clear  Assessment/Plan: S/P Procedure(s) (LRB): AORTIC VALVE REPLACEMENT (AVR) USING MAGNA EASE 21MM AOTIC BIOPROSTHESIS VALVE (N/A) MITRAL VALVE (MV) REPLACEMENT USING MAGNA MITRAL EASE 29MM BIOPROSTHESIS VALVE (N/A) MAZE (N/A) TRICUSPID VALVE REPAIR USING EDWARDS MC3 TRICUSPID ANNULOPLASTY RING SIZE T28 (N/A) SUPRACORONARY STRAIGHT GRAFT REPLACEMENT OF ASCENDING AORTA (N/A) Clipping Of Atrial Appendage  POD 2  Hemodynamically stable in sinus rhythm at 80. Wean off milrinone. Continue Lopressor and amiodarone.  Diuresed 5L yesterday and wt down considerably. Only about 4 lbs over preop now so will stop lasix drip and use intermittent lasix as needed.  Hypokalemia: replete with IV since nauseaous.  Glucose under good control and preop Hgb A1c normal so will stop SSI.  CT output decreasing. May be able to remove later today.  Continue Coumadin   Add Reglan for nausea.  Hoarse voice and dysphagia probably due to ETT and TEE. This should get better over the next few days.  Discussed status with her daughter Hannah Roy by telephone.   LOS: 6 days    Hannah Roy 09/07/2018

## 2018-09-07 NOTE — Progress Notes (Signed)
Patient ID: Hannah Roy, female   DOB: [birth year], 78 y.o.   MRN: [id number]  TCTS Evening Rounds:  Hemodynamically stable. Sinus brady this pm so DDD paced at 80. Will hold Lopressor.  Urine output adequate. Lasix drip is off.  sats 99%.

## 2018-09-08 ENCOUNTER — Inpatient Hospital Stay (HOSPITAL_COMMUNITY): Payer: Medicare Other

## 2018-09-08 LAB — BPAM RBC
Blood Product Expiration Date: 202006232359
Blood Product Expiration Date: 202006232359
Blood Product Expiration Date: 202006232359
Blood Product Expiration Date: 202006232359
Blood Product Expiration Date: 202006232359
Blood Product Expiration Date: 202006242359
ISSUE DATE / TIME: 202005202137
ISSUE DATE / TIME: 202005280658
ISSUE DATE / TIME: 202005280658
ISSUE DATE / TIME: 202005281201
ISSUE DATE / TIME: 202005282237
ISSUE DATE / TIME: 202005282237
Unit Type and Rh: 5100
Unit Type and Rh: 5100
Unit Type and Rh: 5100
Unit Type and Rh: 5100
Unit Type and Rh: 5100
Unit Type and Rh: 5100

## 2018-09-08 LAB — CBC
HCT: 29.1 % — ABNORMAL LOW (ref 36.0–46.0)
Hemoglobin: 9.6 g/dL — ABNORMAL LOW (ref 12.0–15.0)
MCH: 31 pg (ref 26.0–34.0)
MCHC: 33 g/dL (ref 30.0–36.0)
MCV: 93.9 fL (ref 80.0–100.0)
Platelets: 89 10*3/uL — ABNORMAL LOW (ref 150–400)
RBC: 3.1 MIL/uL — ABNORMAL LOW (ref 3.87–5.11)
RDW: 13.8 % (ref 11.5–15.5)
WBC: 12.7 10*3/uL — ABNORMAL HIGH (ref 4.0–10.5)
nRBC: 0 % (ref 0.0–0.2)

## 2018-09-08 LAB — TYPE AND SCREEN
ABO/RH(D): O POS
Antibody Screen: NEGATIVE
Unit division: 0
Unit division: 0
Unit division: 0
Unit division: 0
Unit division: 0
Unit division: 0

## 2018-09-08 LAB — BASIC METABOLIC PANEL
Anion gap: 12 (ref 5–15)
BUN: 17 mg/dL (ref 8–23)
CO2: 29 mmol/L (ref 22–32)
Calcium: 8.5 mg/dL — ABNORMAL LOW (ref 8.9–10.3)
Chloride: 97 mmol/L — ABNORMAL LOW (ref 98–111)
Creatinine, Ser: 0.94 mg/dL (ref 0.44–1.00)
GFR calc Af Amer: >60 mL/min (ref >60)
GFR calc non Af Amer: 59 mL/min — ABNORMAL LOW (ref >60)
Glucose, Bld: 125 mg/dL — ABNORMAL HIGH (ref 70–99)
Potassium: 3.4 mmol/L — ABNORMAL LOW (ref 3.5–5.1)
Sodium: 138 mmol/L (ref 135–145)

## 2018-09-08 LAB — PROTIME-INR
INR: 1.2 (ref 0.8–1.2)
Prothrombin Time: 14.8 seconds (ref 11.4–15.2)

## 2018-09-08 LAB — GLUCOSE, CAPILLARY
Glucose-Capillary: 115 mg/dL — ABNORMAL HIGH (ref 70–99)
Glucose-Capillary: 124 mg/dL — ABNORMAL HIGH (ref 70–99)
Glucose-Capillary: 121 mg/dL — ABNORMAL HIGH (ref 70–99)
Glucose-Capillary: 110 mg/dL — ABNORMAL HIGH (ref 70–99)

## 2018-09-08 MED ORDER — POTASSIUM CHLORIDE 10 MEQ/50ML IV SOLN
10.0000 meq | INTRAVENOUS | Status: AC
  Administered 2018-09-08 (×2): 10 meq via INTRAVENOUS
  Filled 2018-09-08 (×2): qty 50

## 2018-09-08 MED ORDER — POTASSIUM CHLORIDE 10 MEQ/50ML IV SOLN
10.0000 meq | INTRAVENOUS | Status: AC
  Administered 2018-09-08 (×3): 10 meq via INTRAVENOUS
  Filled 2018-09-08 (×3): qty 50

## 2018-09-08 MED ORDER — WARFARIN SODIUM 2 MG PO TABS
4.0000 mg | ORAL_TABLET | Freq: Once | ORAL | Status: AC
  Administered 2018-09-08: 17:00:00 4 mg via ORAL
  Filled 2018-09-08: qty 2

## 2018-09-08 NOTE — Progress Notes (Signed)
3 Days Post-Op Procedure(s) (LRB): AORTIC VALVE REPLACEMENT (AVR) USING MAGNA EASE 21MM AOTIC BIOPROSTHESIS VALVE (N/A) MITRAL VALVE (MV) REPLACEMENT USING MAGNA MITRAL EASE 29MM BIOPROSTHESIS VALVE (N/A) MAZE (N/A) TRICUSPID VALVE REPAIR USING EDWARDS MC3 TRICUSPID ANNULOPLASTY RING SIZE T28 (N/A) SUPRACORONARY STRAIGHT GRAFT REPLACEMENT OF ASCENDING AORTA (N/A) Clipping Of Atrial Appendage Subjective: Only complaint is of hoarse voice, still not swallowing normally.  Objective: Vital signs in last 24 hours: Temp:  [98.1 F (36.7 C)-98.8 F (37.1 C)] 98.7 F (37.1 C) (05/31 0728) Pulse Rate:  [57-98] 86 (05/31 0800) Cardiac Rhythm: Atrial fibrillation (05/31 0800) Resp:  [12-25] 15 (05/31 0800) BP: (81-140)/(57-97) 123/89 (05/31 0800) SpO2:  [92 %-100 %] 99 % (05/31 0800) Weight:  [55.6 kg] 55.6 kg (05/31 0400)  Hemodynamic parameters for last 24 hours:    Intake/Output from previous day: 05/30 0701 - 05/31 0700 In: 487.6 [P.O.:75; I.V.:43.3; IV Piggyback:369.3] Out: 2385 [Urine:2335; Chest Tube:50] Intake/Output this shift: No intake/output data recorded.  General appearance: alert and cooperative Neurologic: intact Heart: regular rate and rhythm, S1, S2 normal, no murmur Lungs: clear to auscultation bilaterally Extremities: extremities normal, atraumatic, no cyanosis or edema Wound: dressings dry  Lab Results: Recent Labs    09/07/18 0400 09/08/18 0423  WBC 10.9* 12.7*  HGB 9.4* 9.6*  HCT 27.5* 29.1*  PLT 63* 89*   BMET:  Recent Labs    09/07/18 0400 09/08/18 0423  NA 138 138  K 3.3* 3.4*  CL 97* 97*  CO2 29 29  GLUCOSE 128* 125*  BUN 10 17  CREATININE 0.90 0.94  CALCIUM 8.2* 8.5*    PT/INR:  Recent Labs    09/08/18 0423  LABPROT 14.8  INR 1.2   ABG    Component Value Date/Time   PHART 7.367 09/06/2018 0627   HCO3 22.3 09/06/2018 0627   TCO2 23 09/06/2018 0627   ACIDBASEDEF 3.0 (H) 09/06/2018 0627   O2SAT 99.0 09/06/2018 0627   CBG  (last 3)  Recent Labs    09/07/18 1206 09/07/18 1556 09/08/18 0727  GLUCAP 141* 105* 115*   CXR: ok  Assessment/Plan: S/P Procedure(s) (LRB): AORTIC VALVE REPLACEMENT (AVR) USING MAGNA EASE 21MM AOTIC BIOPROSTHESIS VALVE (N/A) MITRAL VALVE (MV) REPLACEMENT USING MAGNA MITRAL EASE 29MM BIOPROSTHESIS VALVE (N/A) MAZE (N/A) TRICUSPID VALVE REPAIR USING EDWARDS MC3 TRICUSPID ANNULOPLASTY RING SIZE T28 (N/A) SUPRACORONARY STRAIGHT GRAFT REPLACEMENT OF ASCENDING AORTA (N/A) Clipping Of Atrial Appendage  POD 3 Hemodynamically stable  Rhythm under pacer is CHB with vent rate 20's to 30's. Pacer is on DDD 80 and is sensing atrial rate at 86 and pacing ventricle. Will stop amiodarone and observe. Check ECG in am.  Minimal volume excess. She has diuresed well and weight is only about 3 lbs over preop. Replacing K+.  INR has not bumped yet. She has had 2.5 mg x 2. Will give 4 mg today.   DC sleeve.  Continue IS, ambulation.  Will keep in ICU today to monitor rhythm with CHB.   LOS: 7 days    Gaye Pollack 09/08/2018

## 2018-09-08 NOTE — Progress Notes (Signed)
Patient ID: Hannah Roy, female   DOB: 07-Sep-1940, 78 y.o.   MRN: [id number] TCTS Evening Rounds:  Hemodynamically stable in sinus rhythm.  Urine output ok.  Ambulating well.

## 2018-09-08 NOTE — Progress Notes (Signed)
RN aware of DC central line order 

## 2018-09-08 NOTE — Progress Notes (Signed)
Assisted tele visit to patient with family Celedonio Sortino RN  

## 2018-09-09 LAB — PROTIME-INR
INR: 1.5 — ABNORMAL HIGH (ref 0.8–1.2)
Prothrombin Time: 18 s — ABNORMAL HIGH (ref 11.4–15.2)

## 2018-09-09 LAB — GLUCOSE, CAPILLARY
Glucose-Capillary: 89 mg/dL (ref 70–99)
Glucose-Capillary: 93 mg/dL (ref 70–99)

## 2018-09-09 LAB — BASIC METABOLIC PANEL WITH GFR
Anion gap: 12 (ref 5–15)
BUN: 23 mg/dL (ref 8–23)
CO2: 24 mmol/L (ref 22–32)
Calcium: 8.6 mg/dL — ABNORMAL LOW (ref 8.9–10.3)
Chloride: 102 mmol/L (ref 98–111)
Creatinine, Ser: 0.86 mg/dL (ref 0.44–1.00)
GFR calc Af Amer: >60 mL/min
GFR calc non Af Amer: >60 mL/min
Glucose, Bld: 104 mg/dL — ABNORMAL HIGH (ref 70–99)
Potassium: 3.8 mmol/L (ref 3.5–5.1)
Sodium: 138 mmol/L (ref 135–145)

## 2018-09-09 MED ORDER — WARFARIN SODIUM 2.5 MG PO TABS
2.5000 mg | ORAL_TABLET | Freq: Once | ORAL | Status: AC
  Administered 2018-09-09: 2.5 mg via ORAL
  Filled 2018-09-09: qty 1

## 2018-09-09 MED ORDER — MOVING RIGHT ALONG BOOK
Freq: Once | Status: AC
  Administered 2018-09-09: 18:00:00
  Filled 2018-09-09: qty 1

## 2018-09-09 MED ORDER — ASPIRIN EC 81 MG PO TBEC
81.0000 mg | DELAYED_RELEASE_TABLET | Freq: Every day | ORAL | Status: DC
  Administered 2018-09-09 – 2018-09-13 (×5): 81 mg via ORAL
  Filled 2018-09-09 (×5): qty 1

## 2018-09-09 NOTE — Progress Notes (Addendum)
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 Walked 2 laps in the unit yesterday.  Objective: Vital signs in last 24 hours: Temp:  [97.8 F (36.6 C)-99.4 F (37.4 C)] 98.9 F (37.2 C) (06/01 0400) Pulse Rate:  [80-95] 86 (06/01 0700) Cardiac Rhythm: Heart block;A-V Sequential paced (06/01 0400) Resp:  [5-27] 14 (06/01 0700) BP: (100-150)/(72-103) 136/94 (06/01 0700) SpO2:  [89 %-99 %] 96 % (06/01 0700) Weight:  [55.2 kg] 55.2 kg (06/01 0500)  Filed Weights   09/07/18 0500 09/08/18 0400 09/09/18 0500  Weight: 56.6 kg 55.6 kg 55.2 kg    Weight change: -0.4 kg   Hemodynamic parameters for last 24 hours:    Intake/Output from previous day: 05/31 0701 - 06/01 0700 In: 125 [P.O.:125] Out: 200 [Urine:200]  Intake/Output this shift: No intake/output data recorded.  Current Meds: Scheduled Meds: . acetaminophen  1,000 mg Oral Q6H  . aspirin EC  325 mg Oral Daily  . bisacodyl  10 mg Oral Daily   Or  . bisacodyl  10 mg Rectal Daily  . Chlorhexidine Gluconate Cloth  6 each Topical Daily  . docusate sodium  200 mg Oral Daily  . pantoprazole  40 mg Oral Daily  . sodium chloride flush  3 mL Intravenous Q12H  . Warfarin - Physician Dosing Inpatient   Does not apply q1800   Continuous Infusions: . sodium chloride     PRN Meds:.morphine injection, ondansetron  (ZOFRAN) IV, oxyCODONE, sodium chloride flush, traMADol  General appearance: alert, cooperative and no distress Neurologic: intact Heart: remains in complete heart blockl. DDD pacer sensing atrium and pacing vnetricle. Lungs: clear to auscultation bilaterally Abdomen: soft and non-tender Extremities: No edema. Has one peripheral IV in left arm. Site OK Wound: Sternotomy incision is intact and healing with no sign of complication.  Lab Results: CBC: Recent Labs    09/07/18 0400 09/08/18 0423  WBC 10.9* 12.7*  HGB 9.4* 9.6*  HCT 27.5* 29.1*  PLT 63* 89*   BMET:  Recent Labs    09/08/18 0423 09/09/18 0348  NA 138 138  K 3.4* 3.8  CL 97* 102  CO2 29 24  GLUCOSE 125* 104*  BUN 17 23  CREATININE 0.94 0.86  CALCIUM 8.5* 8.6*    CMET: Lab Results  Component Value Date   WBC 12.7 (H) 09/08/2018   HGB 9.6 (L) 09/08/2018   HCT 29.1 (L) 09/08/2018   PLT 89 (L) 09/08/2018   GLUCOSE 104 (H) 09/09/2018   CHOL 195 08/03/2017   TRIG 107 08/03/2017   HDL 64 08/03/2017   LDLCALC 110 (H) 08/03/2017   ALT 39 09/05/2018   AST 36 09/05/2018   NA 138 09/09/2018   K 3.8 09/09/2018   CL 102 09/09/2018   CREATININE 0.86 09/09/2018  BUN 23 09/09/2018   CO2 24 09/09/2018   TSH 3.399 08/31/2018   INR 1.5 (H) 09/09/2018   HGBA1C 5.4 09/02/2018      PT/INR:  Recent Labs    09/09/18 0348  LABPROT 18.0*  INR 1.5*   Radiology: No results found.   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 Making  excellent progress with mobility and pulmonary hygiene. IINR bumped to 1.5 after Coumadin 2.5mg  x 2 and 4mg  x 1.  Will dose at 2.5mg  again today.  -Complete heart block, bradycardia--beta blocker has been suspended. Temp pacer has appropriate capture. Continue to observe.   -Expected acute blood loss anemia- Hematocrit trending up. Monitor  -Post-op thrombocytopenia--Plt count also recovering.  Monitor  -Endo-not diabetic, glucose well controlled.  -DVT PPX-- SCD's when not ambulatory.    Antony Odea, PA-C [phone] 09/09/2018 7:37 AM   I have seen and examined the patient and agree with the assessment and plan as outlined.  Overall looks quite good.  Keep in ICU until rhythm recovers w/ better underlying rate  Rexene Alberts, MD 09/09/2018 8:21 AM

## 2018-09-09 NOTE — Progress Notes (Signed)
      [address]       [place] [address]             [phone]      POD # 4 AVR, MVR, TV repair  Resting comfortably  BP (!) 134/97   Pulse 94   Temp 98.5 F (36.9 C) (Oral)   Resp 13   Ht 5\' 1"  (1.549 m)   Wt 55.2 kg   SpO2 98%   BMI 22.99 kg/m   No new labs this evening  Remo Lipps C. Roxan Hockey, MD [facility] [phone]

## 2018-09-09 NOTE — Progress Notes (Signed)
Assisted tele visit to patient with family Fredonia Casalino RN  

## 2018-09-10 LAB — CBC
HCT: 30.2 % — ABNORMAL LOW (ref 36.0–46.0)
Hemoglobin: 10 g/dL — ABNORMAL LOW (ref 12.0–15.0)
MCH: 30.9 pg (ref 26.0–34.0)
MCHC: 33.1 g/dL (ref 30.0–36.0)
MCV: 93.2 fL (ref 80.0–100.0)
Platelets: 162 10*3/uL (ref 150–400)
RBC: 3.24 MIL/uL — ABNORMAL LOW (ref 3.87–5.11)
RDW: 13.4 % (ref 11.5–15.5)
WBC: 8.4 10*3/uL (ref 4.0–10.5)
nRBC: 0.2 % (ref 0.0–0.2)

## 2018-09-10 LAB — BASIC METABOLIC PANEL
Anion gap: 10 (ref 5–15)
BUN: 22 mg/dL (ref 8–23)
CO2: 27 mmol/L (ref 22–32)
Calcium: 8.8 mg/dL — ABNORMAL LOW (ref 8.9–10.3)
Chloride: 101 mmol/L (ref 98–111)
Creatinine, Ser: 0.78 mg/dL (ref 0.44–1.00)
GFR calc Af Amer: >60 mL/min (ref >60)
GFR calc non Af Amer: >60 mL/min (ref >60)
Glucose, Bld: 105 mg/dL — ABNORMAL HIGH (ref 70–99)
Potassium: 3.6 mmol/L (ref 3.5–5.1)
Sodium: 138 mmol/L (ref 135–145)

## 2018-09-10 LAB — PROTIME-INR
INR: 2 — ABNORMAL HIGH (ref 0.8–1.2)
Prothrombin Time: 22.4 seconds — ABNORMAL HIGH (ref 11.4–15.2)

## 2018-09-10 MED ORDER — POTASSIUM CHLORIDE 20 MEQ/15ML (10%) PO SOLN
40.0000 meq | Freq: Once | ORAL | Status: AC
  Administered 2018-09-10: 40 meq via ORAL
  Filled 2018-09-10: qty 30

## 2018-09-10 MED ORDER — POTASSIUM CHLORIDE CRYS ER 20 MEQ PO TBCR: 40.0000 meq | EXTENDED_RELEASE_TABLET | Freq: Once | ORAL | Status: DC

## 2018-09-10 MED ORDER — WARFARIN SODIUM 2 MG PO TABS
2.0000 mg | ORAL_TABLET | Freq: Once | ORAL | Status: AC
  Administered 2018-09-10: 2 mg via ORAL
  Filled 2018-09-10: qty 1

## 2018-09-10 MED ORDER — FE FUMARATE-B12-VIT C-FA-IFC PO CAPS
1.0000 | ORAL_CAPSULE | Freq: Every day | ORAL | Status: DC
  Administered 2018-09-10 – 2018-09-13 (×4): 1 via ORAL
  Filled 2018-09-10 (×4): qty 1

## 2018-09-10 NOTE — Progress Notes (Signed)
CT surgery p.m. Rounds  Patient feels great waiting for room on progressive care unit Rate controlled atrial fibrillation Walking in hallway multiple laps

## 2018-09-10 NOTE — Progress Notes (Addendum)
T[facility] DAILY ICU PROGRESS NOTE                   [address]            [place] [address]          [phone]   5 Days Post-Op Procedure(s) (LRB): AORTIC VALVE REPLACEMENT (AVR) USING MAGNA EASE 21MM AOTIC BIOPROSTHESIS VALVE (N/A) MITRAL VALVE (MV) REPLACEMENT USING MAGNA MITRAL EASE 29MM BIOPROSTHESIS VALVE (N/A) MAZE (N/A) TRICUSPID VALVE REPAIR USING EDWARDS MC3 TRICUSPID ANNULOPLASTY RING SIZE T28 (N/A) SUPRACORONARY STRAIGHT GRAFT REPLACEMENT OF ASCENDING AORTA (N/A) Clipping Of Atrial Appendage  Total Length of Stay:  LOS: 9 days   Subjective: Up in chair eating breakfast. Says her sense of taste is improving and she is swallowing better.  Walked several laps around the unit yesterday.  Objective: Vital signs in last 24 hours: Temp:  [97.8 F (36.6 C)-99.1 F (37.3 C)] 98.8 F (37.1 C) (06/02 0700) Pulse Rate:  [82-102] 91 (06/02 0700) Cardiac Rhythm: Heart block;A-V Sequential paced (06/02 0400) Resp:  [13-23] 19 (06/02 0700) BP: (108-154)/(79-118) 131/95 (06/02 0700) SpO2:  [90 %-100 %] 96 % (06/02 0700) FiO2 (%):  [21 %] 21 % (06/01 1520) Weight:  [55 kg] 55 kg (06/02 0522)  Filed Weights   09/08/18 0400 09/09/18 0500 09/10/18 0522  Weight: 55.6 kg 55.2 kg 55 kg    Weight change: -0.224 kg   Intake/Output from previous day: 06/01 0701 - 06/02 0700 In: 400 [P.O.:400] Out: 200 [Urine:200]  Intake/Output this shift: No intake/output data recorded.  Current Meds: Scheduled Meds: . acetaminophen  1,000 mg Oral Q6H  . aspirin EC  81 mg Oral Daily  . bisacodyl  10 mg Oral Daily   Or  . bisacodyl  10 mg Rectal Daily  . Chlorhexidine Gluconate Cloth  6 each Topical Daily  . docusate sodium  200 mg Oral Daily  . ferrous [id number] C-folic acid  1 capsule Oral Q breakfast  . pantoprazole  40 mg Oral Daily  . potassium chloride  40 mEq Oral Once  . sodium chloride flush  3 mL Intravenous Q12H  . warfarin  2 mg Oral ONCE-1800   . Warfarin - Physician Dosing Inpatient   Does not apply q1800   Continuous Infusions: . sodium chloride     PRN Meds:.morphine injection, ondansetron (ZOFRAN) IV, oxyCODONE, sodium chloride flush, traMADol   General appearance: alert, cooperative and no distress. Voice is stronger Neurologic: intact Heart: Pacer turned off. Irregular rhythm with rate 80's-90. Lungs: breath sounds clear. Abdomen: soft and non-tender Extremities: No edema. Has one peripheral IV in left arm. Site OK Wound: Sternotomy incision is intact and healing with no sign of complication.  Lab Results: CBC: Recent Labs    09/08/18 0423 09/10/18 0223  WBC 12.7* 8.4  HGB 9.6* 10.0*  HCT 29.1* 30.2*  PLT 89* 162   BMET:  Recent Labs    09/09/18 0348 09/10/18 0223  NA 138 138  K 3.8 3.6  CL 102 101  CO2 24 27  GLUCOSE 104* 105*  BUN 23 22  CREATININE 0.86 0.78  CALCIUM 8.6* 8.8*    CMET: Lab Results  Component Value Date   WBC 8.4 09/10/2018   HGB 10.0 (L) 09/10/2018   HCT 30.2 (L) 09/10/2018   PLT 162 09/10/2018   GLUCOSE 105 (H) 09/10/2018   CHOL 195 08/03/2017   TRIG 107 08/03/2017   HDL 64 08/03/2017   LDLCALC 110 (H)  08/03/2017   ALT 39 09/05/2018   AST 36 09/05/2018   NA 138 09/10/2018   K 3.6 09/10/2018   CL 101 09/10/2018   CREATININE 0.78 09/10/2018   BUN 22 09/10/2018   CO2 27 09/10/2018   TSH 3.399 08/31/2018   INR 2.0 (H) 09/10/2018   HGBA1C 5.4 09/02/2018      PT/INR:  Recent Labs    09/10/18 0223  LABPROT 22.4*  INR 2.0*   Radiology: No results found.   Assessment/Plan: S/P Procedure(s) (LRB): AORTIC VALVE REPLACEMENT (AVR) USING MAGNA EASE 21MM AOTIC BIOPROSTHESIS VALVE (N/A) MITRAL VALVE (MV) REPLACEMENT USING MAGNA MITRAL EASE 29MM BIOPROSTHESIS VALVE (N/A) MAZE (N/A) TRICUSPID VALVE REPAIR USING EDWARDS MC3 TRICUSPID ANNULOPLASTY RING SIZE T28 (N/A) SUPRACORONARY STRAIGHT GRAFT REPLACEMENT OF ASCENDING AORTA (N/A) Clipping Of Atrial Appendage   -POD4 bioprosthetic AVR, MVR, TV repair, MAZE, Clip LAA for rheumatic heart disease and chronic atrial fibrillation. Continues to make excellent progress with mobility and pulmonary hygiene. IINR now 2.0.   Will continue Coumadin at 2mg   today.   -Post-op complete heart block, bradycardia--beta blocker and amiodarone have been suspended. Has consistent AV conductionnow. Pacer off. Continue to observe rhythm closely.  -Expected acute blood loss anemia- Hematocrit trending up. Monitor  -Post-op thrombocytopenia--Plt count also recovering.  Monitor  -Endo-not diabetic, glucose well controlled.  -DVT PPX-- now adequately anticoagulated with Coumadin.   -Disposition- OK to transfer to progressive care on 4E today.    Antony Odea, PA-C [phone] 09/10/2018 7:44 AM    I have seen and examined the patient and agree with the assessment and plan as outlined.  Looks good.  Rhythm improved.  Transfer 4E.  Rexene Alberts, MD 09/10/2018 8:50 AM

## 2018-09-10 NOTE — Progress Notes (Signed)
    Conduction improved.  We will continue to follow to assist with discharge planning and follow up.

## 2018-09-10 NOTE — Discharge Summary (Signed)
Physician Discharge Summary  Patient ID: Hannah Roy MRN: [id number] DOB/AGE: 76-Mar-1942 76 y.o.  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 + tricuspid valve repair + maze procedure + repair ascending aortic aneurysm Active Problems:   Essential hypertension, benign   Atrial fibrillation with RVR (HCC)   Hypothyroidism   Persistent atrial fibrillation   Rheumatic heart disease   Orthostasis   Orthostatic hypotension   Aortic insufficiency, rheumatic   Mitral stenosis with regurgitation, rheumatic   Tricuspid regurgitation   Ascending aorta dilatation (HCC)   Acute on chronic diastolic heart failure (HCC)   S/P mitral valve replacement with bioprosthetic valve   S/P tricuspid valve repair   S/P Maze operation for atrial fibrillation   S/P ascending aortic aneurysm repair   Discharged Condition: good  History of Present Illness:  HPI:  Patient is a 78 year old female originally from [place] recently diagnosed with rheumatic heart disease and recurrent persistent atrial fibrillation who was readmitted to the hospital with tachypalpitations, dizzy spells, and shortness of breath c/w acute on chronic diastolic congestive heart failure who has been referred for surgical consultation.  Patient has been healthy all of her adult life and physically active.  Past medical history is notable only for history of hypertension.  She denies any known history of rheumatic fever during childhood or previous history of heart murmur.  In January of this year she presented with tachypalpitations and shortness  of breath.  She was seen by her primary care physician and was diagnosed with atrial fibrillation.  She was anticoagulated using Eliquis and started on diltiazem for rate control.  An echocardiogram was performed demonstrating normal left ventricular systolic function with ejection fraction estimated 60 to 65%.  There was felt to be mild to moderate mitral regurgitation, moderate to severe aortic regurgitation, and mild aortic stenosis.  There was severe left and moderate right atrial enlargement.  She was seen by Dr. [name] who has been following her carefully ever since.  She has spontaneously converted back to sinus rhythm at the time of his initial office consultation on May 17, 2018.  On Aug 09, 2018 the patient was seen in the emergency department at [facility] with recurrent atrial fibrillation and rapid ventricular response.  She underwent DC cardioversion and was discharged home.  Since then symptoms have continued to progress with worsening exertional shortness of breath, chest tightness, and palpitations.  She underwent TEE and cardiac catheterization on Aug 29, 2018.  TEE revealed normal left ventricular systolic function with rheumatic aortic and mitral valve disease.  There was moderate mitral stenosis and moderate mitral regurgitation.  There was at least moderate aortic insufficiency and mild aortic stenosis.  There was moderate tricuspid regurgitation.  Catheterization revealed mild nonobstructive coronary artery disease and hemodynamic findings consistent with mitral stenosis and mitral regurgitation.  There was mild pulmonary hypertension.  Mean pulmonary capillary wedge pressure was 17 with V wave 26 mmHg.  Central venous pressure was 10 and cardiac output normal.  Cardiothoracic surgical consultation was requested and the patient was scheduled to see me in the office next week.  She presented to the emergency department at [facility] yesterday with worsening dizziness  and shortness of breath.  She was  again found to be in atrial fibrillation.  She was transferred to [facility] for further management.  CT angiogram of the chest revealed no evidence for pulmonary embolism.  There was mild fusiform dilatation of the ascending aorta.  COVID testing was negative.  Hospital Course: Ms. Nickey was admitted to the hospital and started on heparin for washout of Eliquis.  Heart rate was controlled with amiodarone and diltiazem.  She remained hemodynamically stable.  She was prepared and taken to the operating room on 09/05/2018 where aortic valve replacement was carried out utilizing a 21 mm Edwards MagnaEase bovine pericardial tissue valve, mitral valve replacement with a 29 mm Edwards MagnaEase bovine pericardial tissue valve, tricuspid valve repair with an Edwards MC 3 annuloplasty ring (28 mm), complete bilateral atrial maze procedure, and repair of ascending thoracic aortic aneurysm with a Hemashield platinum supracoronary straight graft.   She was weaned from cardiopulmonary bypass on low-dose dopamine.  She remained hemodynamically stable.  She was transferred to the cardiovascular ICU in stable condition.  She had a coagulopathy early postoperatively that was managed with transfusion of fresh frozen plasma, platelet pheresis, and packed red cells. She remained hemodynamically stable.  She was extubated on the morning of the first post-op day. She was started on amiodarone and a low-dose bata blocker early post-op but developed complete heart with bradycardia so these agents were discontinued.  She was paced utlilizing the epicardial pacing wires until her conduction system recovered. She eventually had return of SR with 1st degree AV block. She otherwise made a progressive and satisfactory recovery. She was completely independent with ambulation within a few days after surgery and quickly weaned from supplemental O2.  She was anticoagulated with coumadin and her  INR was 1.9 on the day of discharge.   Consults: cardiology  Significant Diagnostic Studies:   TRANSTHORACIC ECHOCARDIOGRAM REPORT    Patient Name: Hannah Roy Date of Exam: 05/10/2018 Medical Rec #: [id number] Height: 61.0 in Accession #: [id number] Weight: 131.0 lb Date of Birth: January 04, 1941 BSA: 1.58 m Patient Age: 37 years BP: 128/87 mmHg Patient Gender: F HR: 130 bpm. Exam Location: [facility]   Procedure: 2D Echo  Indications: I51.7 (ICD-10-CM) - Cardiomegaly, R06.09 (ICD-10-CM) - DOE (dyspnea on exertion), I70.0 (ICD-10-CM) - Aortic atherosclerosis  History: Patient has no prior history of Echocardiogram examinations. Risk Factors: Hypertension.  Sonographer: Alvino Chapel RCS Referring Phys: [phone] Kathyrn Drown Diagnosing Phys: Kate Sable MD  IMPRESSIONS   1. The left ventricle has normal systolic function of 15-17%. The cavity size is normal. There is moderate focal basal septal left ventricular wall thickness. Echo evidence of unable to assess due to arrhythmia (atrial fibrillation and/or flutter)  diastolic filling patterns. 2. Severely dilated left atrial size. 3. Moderately dilated right atrial size. 4. The mitral valve is myxomatous. There is mild thickening. Regurgitation is mild to moderate by color flow Doppler. 5. Normal tricuspid valve. 6. Tricuspid regurgitation moderate. 7. The aortic valve tricuspid. There is moderate thickening of the aortic valve. Aortic valve regurgitation is moderate to severe by color flow Doppler. The calculated aortic valve area is 0.99 cm consistent with mild stenosis. 8. Mild dilatation of the aortic root. 9. The inferior vena cava was dilated in size with >50% respiratory variablity. 10. The patient was tachycardic throughout the study with evidence of atrial  fibrillation and/or flutter. 11. No atrial level shunt detected by color flow Doppler.  FINDINGS Left Ventricle: No evidence of left ventricular regional wall motion  abnormalities. The left ventricle has normal systolic function of 85-88%. The cavity size is normal. There is moderate focal basal septal left ventricular wall thickness. Echo evidence of unable to assess due to arrhythmia (atrial fibrillation and/or flutter) diastolic filling patterns. Right Ventricle: The right ventricle is normal in size. There is normal hypertrophy. There is normal systolic function. Left Atrium: The left atrium is severely dilated. Right Atrium: The right atrial size is moderately dilated. Interatrial Septum: No atrial level shunt detected by color flow Doppler.  Pericardium: There is no evidence of pericardial effusion. Mitral Valve: The mitral valve is myxomatous. There is mild thickening. Regurgitation is mild to moderate by color flow Doppler. Tricuspid Valve: The tricuspid valve is normal in structure. Tricuspid regurgitation moderate by color flow Doppler. Aortic Valve: The aortic valve tricuspid. There is moderate thickening of the aortic valve. Aortic valve regurgitation is moderate to severe by color flow Doppler. The calculated aortic valve area is 0.99 cm consistent with mild stenosis. Pulmonic Valve: The pulmonic valve is grossly normal. Pulmonic valve regurgitation is trivial by color flow Doppler. Aorta: There is mild dilatation of the aortic root. Venous: The inferior vena cava was dilated in size with greater than 50% respiratory variablity. In comparison to the previous echocardiogram(s): There are no prior studies on this patient for comparison purposes. The patient was tachycardic throughout the study with evidence of atrial fibrillation and/or flutter.  LEFT VENTRICLE PLAX 2D (Teich) Biplane EF (MOD) LV EF: 55.7 % LV Biplane EF: 59.3 % LVIDd:  4.05 cm LV A4C EF: 43.4 % LVIDs: 2.89 cm LV A2C EF: 69.4 % LV PW: 0.90 cm LV IVS: 1.29 cm LVOT diam: 1.60 cm LV SV: 40 ml LVOT Area: 2.01 cm  LV Volumes (MOD) LV area d, A2C: 25.40 cm LV area d, A4C: 23.70 cm LV area s, A2C: 12.30 cm LV area s, A4C: 16.20 cm LV major d, A2C: 7.14 cm LV major d, A4C: 6.83 cm LV major s, A2C: 5.79 cm LV major s, A4C: 5.73 cm LV vol d, MOD A2C: 76.1 ml LV vol d, MOD A4C: 67.8 ml LV vol s, MOD A2C: 23.3 ml LV vol s, MOD A4C: 38.4 ml LV SV MOD A2C: 52.8 ml LV SV MOD A4C: 67.8 ml LV SV MOD BP: 43.5 ml  RIGHT VENTRICLE TAPSE (M-mode): 1.4 cm RVSP: 33.6 mmHg  LEFT ATRIUM Index RIGHT ATRIUM Index LA diam: 4.20 cm 2.66 cm/m RA Pressure: 8 mmHg LA Vol (A2C): 109.0 ml 69.08 ml/m RA Area: 27.70 cm LA Vol (A4C): 114.0 ml 72.25 ml/m RA Volume: 90.20 ml 57.16 ml/m LA Biplane Vol: 113.0 ml 71.61 ml/m AORTIC VALVE AV Area (Vmax): 0.95 cm AV Area (Vmean): 0.90 cm AV Area (VTI): 0.99 cm AV Vmax: 207.00 cm/s AV Vmean: 145.000 cm/s AV VTI: 0.403 m AV Peak Grad: 17.1 mmHg AV Mean Grad: 10.0 mmHg LVOT Vmax: 97.90 cm/s LVOT Vmean: 64.600 cm/s LVOT VTI: 0.199 m LVOT/AV VTI ratio: 0.49 AR PHT: 356 msec  AORTA Ao Root diam: 3.90 cm  MITRAL VALVE TR Peak grad: 25.6 mmHg MV Area (PHT): 1.24 cm TR Vmax: 271.00 cm/s MV Peak grad: 20.4 mmHg RVSP: 33.6 mmHg MV Mean grad: 8.0 mmHg MV Vmax: 2.26 m/s MV Vmean: 128.0 cm/s MV VTI: 0.60 m MV PHT: 177.77 msec MV Decel Time: 613 msec MR Peak grad: 102.4 mmHg MR Mean grad: 71.0 mmHg MR Vmax: 506.00 cm/s MR Vmean: 395.5 cm/s MV E velocity: 179.00 cm/s   Kate Sable  MD Electronically signed  by Kate Sable MD Signature Date/Time: 05/10/2018/11:43:56 AM   TRANSESOPHOGEAL ECHO REPORT     Patient Name: Hannah Roy Date of Exam: 08/29/2018 Medical Rec #: [id number] Height: 61.0 in Accession #: [id number] Weight: 123.0 lb Date of Birth: Aug 17, 1940 BSA: 1.54 m Patient Age: 25 years BP: 115/41 mmHg Patient Gender: F HR: 60 bpm. Exam Location: Inpatient   Procedure: Transesophageal Echo, Cardiac Doppler, Color Doppler and 3D Echo  Indications: Mitral Stenosis, Rheumatic  History: Patient has prior history of Echocardiogram examinations, most recent 04/22/2018. Pulmonic Valve Disease. Hypertension, Atrial Fibrillation with Rapid Ventricular Rate, Mitral Stenosis.  Sonographer: Madelaine Etienne RDCS (AE) Referring Phys: [place] Diagnosing Phys: Skeet Latch MD    PROCEDURE: Normal Transesophogeal exam. After discussion of the risks and benefits of a TEE, an informed consent was obtained from the patient. Local oropharyngeal anesthetic was provided with viscous lidocaine. Patients was under conscious sedation  during this procedure. The transesophogeal probe was passed through the esophogus of the patient. Image quality was good. The patient's vital signs; including heart rate, blood pressure, and oxygen saturation; remained stable throughout the procedure.  The patient developed no complications during the procedure.  IMPRESSIONS   1. The left ventricle has normal systolic function, with an ejection fraction of 55-60%. The cavity size was normal. 2. No evidence of a thrombus present in the left atrial appendage. 3. The mitral valve is rheumatic. Moderate thickening of the anterior mitral valve leaflet, with moderately decreased mobility. Moderate mitral valve stenosis. 4. Hockey  stick-like motion of the anterior mitral valve leaflet consistent with Rheumatic mitral valve disease. 5. Tricuspid valve regurgitation is moderate. 6. The aortic valve is tricuspid Mild thickening of the aortic valve Mild calcification of the aortic valve. Aortic valve regurgitation is moderate by color flow Doppler. Mild stenosis of the aortic valve. 7. Aortic valve leaflet motion mildly restricted. Aortic valve leaflets do not fully coapt, resulting in central aortic regurgitation. Aortic regurgitaiton pressure half time 397 ms, consistent with moderate aortic regurgitation. 8. Pulmonic valve regurgitation is mild by color flow Doppler.  FINDINGS Left Ventricle: The left ventricle has normal systolic function, with an ejection fraction of 55-60%. The cavity size was normal.  Left Atrium: Left atrial size was normal in size. LAA velocity 0.56 m/s.   Left Atrial Appendage: No evidence of a thrombus present in the left atrial appendage.  Interatrial Septum: No atrial level shunt detected by color flow Doppler.  Mitral Valve: The mitral valve is rheumatic. Moderate thickening of the anterior mitral valve leaflet, with moderately decreased mobility. Mitral valve regurgitation is trivial by color flow Doppler. Moderate mitral valve stenosis. Hockey stick-like  motion of the anterior mitral valve leaflet consistent with Rheumatic mitral valve disease.  Tricuspid Valve: Tricuspid valve regurgitation is moderate by color flow Doppler.  Aortic Valve: The aortic valve is tricuspid Mild thickening of the aortic valve. Mild calcification of the aortic valve. Aortic valve regurgitation is moderate by color flow Doppler. The vena contracta measures 0.56 cm. There is Mild stenosis of the  aortic valve, with a calculated valve area of 1.42 cm. Aortic valve leaflet motion mildly restricted. Aortic valve leaflets do not fully coapt, resulting in central aortic regurgitation. Aortic regurgitaiton  pressure half time 397 ms, consistent with  moderate aortic regurgitation.  Pulmonic Valve: The pulmonic valve was normal in structure. Pulmonic valve regurgitation is mild by color flow Doppler. No evidence of pulmonic stenosis.   +--------------+--------++  LEFT VENTRICLE     +--------------+--------++  PLAX 2D      +--------------+--------++  LVOT diam:  2.00 cm    +--------------+--------++  LVOT Area:  3.14 cm   +--------------+--------++        +--------------+--------++  +------------------+------------++  AORTIC VALVE      +------------------+------------++  AV Area (Vmax):  1.73 cm    +------------------+------------++  AV Area (Vmean):  1.62 cm    +------------------+------------++  AV Area (VTI):  1.42 cm    +------------------+------------++  AV Vmax:  248.50 cm/s    +------------------+------------++  AV Vmean:  187.000 cm/s   +------------------+------------++  AV VTI:  0.722 m    +------------------+------------++  AV Peak Grad:  24.7 mmHg    +------------------+------------++  AV Mean Grad:  15.0 mmHg    +------------------+------------++  LVOT Vmax:  137.00 cm/s    +------------------+------------++  LVOT Vmean:  96.500 cm/s    +------------------+------------++  LVOT VTI:  0.325 m    +------------------+------------++  LVOT/AV VTI ratio: 0.45    +------------------+------------++  AR PHT:  517 msec    +------------------+------------++  +------------+-------++  AORTA      +------------+-------++  Ao Asc diam: 3.67 cm   +------------+-------++  +--------------+-----------++ +---------------+-----------++  MITRAL VALVE       TRICUSPID VALVE     +--------------+-----------++ +---------------+-----------++  MV Area (PHT): 1.26 cm     TR  Peak grad:  32.9 mmHg    +--------------+-----------++ +---------------+-----------++  MV Peak grad:  10.9 mmHg     TR Vmax:  287.00 cm/s   +--------------+-----------++ +---------------+-----------++  MV Mean grad:  6.0 mmHg    +--------------+-----------++ +--------------+-------+  MV Vmax:  1.65 m/s     SHUNTS     +--------------+-----------++ +--------------+-------+  MV Vmean:  117.0 cm/s     Systemic VTI:  0.32 m   +--------------+-----------++ +--------------+-------+  MV VTI:  0.62 m     Systemic Diam: 2.00 cm  +--------------+-----------++ +--------------+-------+  MV PHT:  175.00 msec   +--------------+-----------++   Skeet Latch MD Electronically signed by Skeet Latch MD Signature Date/Time: 08/29/2018/1:12:04 PM    RIGHT/LEFT HEART CATH AND CORONARY ANGIOGRAPHY  Conclusion     Prox LAD lesion is 40% stenosed.  Mid Cx lesion is 40% stenosed.  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  Hannah Roy a 78 y.o.female   [id number] LOCATION: FACILITY: [facility]  PHYSICIAN: Quay Burow, M.D. [birth year]   DATE OF PROCEDURE: 08/29/2018  DATE OF DISCHARGE:     CARDIAC CATHETERIZATION    History obtained from chart review. Ms. Plotner has rheumatic heart disease with moderate MS, severe AI and normal LV function. She did have A. fib in the past and was symptomatic. She is currently in sinus rhythm and was on Eliquis which was discontinued 3 days ago. She presents now for right and left heart cath to define her anatomy and physiology in anticipation for multivalve surgery by Dr. Roxy Manns.   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 The Swan-Ganz catheter was removed. The radial sheath was removed and a TR band was placed on the right wrist to  achieve patent hemostasis. The patient left lab stable condition. She can be discharged home today as an outpatient and restart Eliquis oral anticoagulation. We will arrange outpatient follow-up and evaluation with Dr. Roxy Manns to determine suitability for multivalve replacement.  Quay Burow. MD, [facility] 08/29/2018 1:37 PM      Recommendations   Antiplatelet/Anticoag  No indication for antiplatelet therapy at this time .  Surgeon Notes     08/29/2018 9:26 AM CV Procedure signed by Skeet Latch, MD  Indications   Rheumatic heart disease [I09.9 (ICD-10-CM)]  Procedural Details   Technical Details PROCEDURE DESCRIPTION:   The patient was brought to the second floor [facility] Cardiac cath lab in the postabsorptive state. She was premedicated with IV Versed and fentanyl. Her right wrist and antecubital fossa Were prepped and shaved in usual sterile fashion. Xylocaine 1% was used for local anesthesia. A 6 French sheath was inserted into the right radial artery using standard Seldinger technique. A 5 French sheath was inserted into the right antecubital vein. A 5 French balloontipped Swan-Ganz catheter was advanced through the right heart chambers obtaining sequential pressures and pulmonary artery saturations for the determination of Fick cardiac output. 5 [place] TIG catheter and pigtail catheters were used for selective coronary artery angiography, LV pressures and simultaneous LV wedge for the determination of mitral valve gradient. Isovue dye was used for the entirety of the case. Retrograde aorta, left ventricular pullback pressures were recorded. The patient received 3500 units of heparin intravenously. Radial cocktail was administered via the SideArm sheath. Estimated blood loss <50 mL.   During this procedure medications were administered to achieve and maintain moderate conscious sedation while the patient's heart rate, blood pressure, and oxygen saturation were  continuously monitored and I was present face-to-face 100% of this time.  Medications  (Filter: Administrations occurring from 08/29/18 1232 to 08/29/18 1346)  Medication Rate/Dose/Volume Action  Date Time   fentaNYL (SUBLIMAZE) injection (mcg) 25 mcg Given 08/29/18 1254   Total dose as of 09/01/18 1748        25 mcg        midazolam (VERSED) injection (mg) 1 mg Given 08/29/18 1254   Total dose as of 09/01/18 1748        1 mg        heparin injection (Units) 3,000 Units Given 08/29/18 1308   Total dose as of 09/01/18 1748        3,000 Units        Heparin (Porcine) in NaCl 1000-0.9 UT/500ML-% SOLN (mL) 500 mL Given 08/29/18 1309   Total dose as of 09/01/18 1748 500 mL Given 1310   1,000 mL        Radial Cocktail/Verapamil only (mL) 10 mL Given 08/29/18 1310   Total dose as of 09/01/18 1748        10 mL        lidocaine (PF) (XYLOCAINE) 1 % injection (mL) 2 mL Given 08/29/18 1301   Total dose as of 09/01/18 1748 2 mL Given 1304   4 mL        Sedation Time   Sedation Time Physician-1: 30 minutes 24 seconds  Coronary Findings   Diagnostic  Dominance: Right  Left Anterior Descending  Prox LAD lesion 40% stenosed  Prox LAD lesion is 40% stenosed.  Left Circumflex  Mid Cx lesion 40% stenosed  Mid Cx lesion is 40% stenosed.  Intervention   No interventions have been documented.  Right Heart   Right Heart Pressures Hemodynamic findings consistent with mitral valve regurgitation and mitral valve stenosis. Right atrial pressure- 10/11 Right ventricular pressure- 41/0 Pulmonary artery pressure- 38/13, mean 23 Pulmonary wedge pressure- A-wave equals 19, V wave equals 26, mean equals 17 LVEDP- 14 LV-wedge- 9.4 mm Cardiac output- 4.94 L/min  Coronary Diagrams   Diagnostic  Dominance: Right  Intervention    Treatments: Surgery  CARDIOTHORACIC SURGERY OPERATIVE NOTE  Date of Procedure:                 09/05/2018  Preoperative Diagnosis:        Rheumatic Valvular Heart Disease  Moderate-severe aortic insufficiency with mild aortic stenosis  Moderate-severe mitral stenosis with mild mitral regurgitation  Moderate tricuspid regurgitation  Recurrent persistent atrial fibrillation  Ascending thoracic aortic aneurysm   Postoperative Diagnosis:    Same   Procedure:       Aortic  Valve Replacement              Edwards Magna Ease Bovine Bioprosthetic Tissue Valve (size 50mm, model #3300TFX, serial #[id number])   Mitral Valve Replacement              [facility] Mitral Bovine Bioprosthetic Tissue Valve (size 71mm, model #7300TFX, serial #[id number])   Tricuspid Valve Repair              Edwards mc3 Ring Annuloplasty (size 55mm, model #4900, serial [id number])   Maze Procedure              complete bilateral atrial lesion set using bipolar radiofrequency and cryothermy ablation             clipping of left atrial appendage (Atricure Pro245 left atrial clip size 53mm)   Repair Ascending Thoracic Aortic Aneurysm              Hemashield Platinum supracoronary straight graft (size 74mm, ref #[id number], serial #[id number])                Surgeon:        Valentina Gu. Roxy Manns, MD  Assistant:       Nicholes Rough, PA-C  Anesthesia:    Laurie Panda, MD  Operative Findings:  Rheumatic valvular heart disease  Moderate-severe aortic insufficiency with mild aortic stenosis  Moderate-severe mitral stenosis with mild mitral regurgitation  Type I tricuspid valve dysfunction with moderate tricuspid regurgitation  Fusiform aneurysmal enlargement of entire ascending thoracic aorta  Normal LV systolic function  Normal RV systolic function  Moderate left atrial enlargement                         The  Discharge Exam: Blood pressure 98/62, pulse 67, temperature 98.1 F (36.7 C), temperature source Oral, resp. rate 18, height 5\' 1"   (1.549 m), weight 54.4 kg, SpO2 98 %.  General appearance: alert, cooperative and no distress Neurologic: intact Heart: SR with 1st degree AVB, occasional dropped beat.  Lungs: Breath sounds clear. Extremities: All well perfused with no edema. Wound: Sternal incision intact and dry. CT sites clean and appear to be well-healed.   Disposition:   Discharge Instructions    Amb Referral to Cardiac Rehabilitation   Complete by:  As directed    Diagnosis:   Valve Replacement Valve Repair     Valve:   Tricuspid Mitral     After initial evaluation and assessments completed: Virtual Based Care may be provided alone or in conjunction with Phase 2 Cardiac Rehab based on patient barriers.:  Yes     Allergies as of 09/13/2018      Reactions   Bee Venom Anaphylaxis   Boniva [ibandronic Acid] Nausea And Vomiting, Other (See Comments)   Upset stomach, flu like symptoms    Lisinopril Cough   Latex Rash      Medication List    STOP taking  these medications   apixaban 5 MG Tabs tablet Commonly known as:  ELIQUIS   diltiazem 240 MG 24 hr capsule Commonly known as:  Cartia XT   losartan 25 MG tablet Commonly known as:  COZAAR   metoprolol tartrate 25 MG tablet Commonly known as:  LOPRESSOR     TAKE these medications   aspirin 81 MG EC tablet Take 1 tablet (81 mg total) by mouth daily. Start taking on:  September 14, 2018   chlorpheniramine 4 MG tablet Commonly known as:  CHLOR-TRIMETON Take 4 mg by mouth daily as needed for allergies.   ferrous [id number] C-folic acid capsule Commonly known as:  TRINSICON / FOLTRIN Take 1 capsule by mouth daily with breakfast. Start taking on:  September 14, 2018   traMADol 50 MG tablet Commonly known as:  ULTRAM Take 1 tablet (50 mg total) by mouth every 4 (four) hours as needed for moderate pain.   Valtrex 1000 MG tablet Generic drug:  valACYclovir Take 1,000 mg by mouth daily as needed (fever blisters.).   warfarin 2.5 MG  tablet Commonly known as:  COUMADIN Take 1 tablet (2.5 mg total) by mouth daily at 6 PM.     The patient has been discharged on:   1.Beta Blocker:  Yes [   ]                              No   [ x  ]                              If No, reason: Bradycardia, heart block  2.Ace Inhibitor/ARB: Yes [   ]                                     No  [  x  ]                                     If No, reason: Not indicated  3.Statin:   Yes [   ]                  No  [ x  ]                  If No, reason:No coronary artery disease 4.S[place]a [place]:  Yes  [ x  ]                  No   [   ]                  If No, reason:   Follow-up Information    Schedule an appointment as soon as possible for a visit  with Kathyrn Drown, MD.   Specialty:  Family Medicine Contact information: [place] [address] [phone]        Rexene Alberts, MD. Go on 10/21/2018.   Specialty:  Cardiothoracic Surgery Why:  You have an appointment with Dr. Roxy Manns on Monday 10/21/18 at 3:30pm.  Please arrive 30 minutes early for a chest x-ray to be performed on the first floor of the same building. Contact information: [place] [address] [phone]  [place] Office. Go to.   Specialty:  Cardiology Why:  [place] location - on 09/19/18 at 2:30 pm, you have an appointment at the [facility] to get your PT-INR blood test used for managing your Coumadin dose. Contact information: [address] [place] [phone]       [facility] [place]. Call.   Specialty:  Cardiothoracic Surgery Why:  You will have an appointment for a nurse visit at Dr. Guy Sandifer office next week for suture removal. If you have not heard from the office by Wednesday 09/18/18, please call office for instructions.  Contact information: [place], [place] [phone]       Imogene Burn, PA-C Follow up.   Specialty:  Cardiology Why:  [place] office located in [facility] - on 09/23/18 at 1pm, you will see one of our cardiology PAs, Ermalinda Barrios, for your post-hospital visit. Please arrive 15 minutes prior to appointment to check in. Contact information: [place] [address] [phone]           Signed: Antony Odea , PA-C 09/13/2018, 10:14 AM

## 2018-09-11 DIAGNOSIS — R001 Bradycardia, unspecified: Secondary | ICD-10-CM

## 2018-09-11 LAB — PROTIME-INR
INR: 2.2 — ABNORMAL HIGH (ref 0.8–1.2)
Prothrombin Time: 23.8 seconds — ABNORMAL HIGH (ref 11.4–15.2)

## 2018-09-11 MED ORDER — WHITE PETROLATUM EX OINT
TOPICAL_OINTMENT | CUTANEOUS | Status: AC
  Filled 2018-09-11: qty 28.35

## 2018-09-11 MED ORDER — AMIODARONE HCL 200 MG PO TABS: 200.0000 mg | ORAL_TABLET | Freq: Two times a day (BID) | ORAL | Status: DC

## 2018-09-11 MED ORDER — WARFARIN SODIUM 2 MG PO TABS
2.0000 mg | ORAL_TABLET | Freq: Every day | ORAL | Status: DC
  Administered 2018-09-11 – 2018-09-12 (×2): 2 mg via ORAL
  Filled 2018-09-11 (×2): qty 1

## 2018-09-11 NOTE — Progress Notes (Signed)
Pt received from [facility]. VSS. Telemetry applied. CHG complete. Pt oriented to room and unit. Will continue to monitor.  Clyde Canterbury, RN

## 2018-09-11 NOTE — Progress Notes (Signed)
Progress Note  Patient Name: Hannah Roy Date of Encounter: 09/11/2018  Primary Cardiologist:   Kate Sable, MD   Subjective   She feels good.  Mild soreness but she is pleased with her progress.  Ambulated in the hallway yesterday.   Inpatient Medications    Scheduled Meds: . amiodarone  200 mg Oral BID PC  . aspirin EC  81 mg Oral Daily  . bisacodyl  10 mg Oral Daily   Or  . bisacodyl  10 mg Rectal Daily  . Chlorhexidine Gluconate Cloth  6 each Topical Daily  . docusate sodium  200 mg Oral Daily  . ferrous [id number] C-folic acid  1 capsule Oral Q breakfast  . pantoprazole  40 mg Oral Daily  . sodium chloride flush  3 mL Intravenous Q12H  . warfarin  2 mg Oral q1800  . Warfarin - Physician Dosing Inpatient   Does not apply q1800   Continuous Infusions: . sodium chloride     PRN Meds: morphine injection, ondansetron (ZOFRAN) IV, oxyCODONE, sodium chloride flush, traMADol   Vital Signs    Vitals:   09/10/18 2300 09/11/18 0200 09/11/18 0400 09/11/18 0806  BP:  118/77    Pulse:  (!) 150    Resp:  17    Temp: 99.3 F (37.4 C)  98.7 F (37.1 C) 99.1 F (37.3 C)  TempSrc: Oral  Oral Oral  SpO2:  93%    Weight:      Height:        Intake/Output Summary (Last 24 hours) at 09/11/2018 1028 Last data filed at 09/10/2018 1800 Gross per 24 hour  Intake 400 ml  Output -  Net 400 ml   Filed Weights   09/08/18 0400 09/09/18 0500 09/10/18 0522  Weight: 55.6 kg 55.2 kg 55 kg    Telemetry    SB, long first degree AV block, no atrial fib,  - Personally Reviewed  ECG    NSR, high degree block, junctional escape  - Personally Reviewed  Physical Exam   GEN: No acute distress.   Neck: No  JVD Cardiac: RRR, no murmurs, rubs, or gallops.  Respiratory: Clear  to auscultation bilaterally. GI: Soft, nontender, non-distended  MS: No  edema; No deformity. Neuro:  Nonfocal  Psych: Normal affect   Labs    Chemistry Recent Labs  Lab 09/05/18 0344   09/08/18 0423 09/09/18 0348 09/10/18 0223  NA 141   < > 138 138 138  K 4.0   < > 3.4* 3.8 3.6  CL 105   < > 97* 102 101  CO2 28   < > 29 24 27   GLUCOSE 94   < > 125* 104* 105*  BUN 16   < > 17 23 22   CREATININE 0.95   < > 0.94 0.86 0.78  CALCIUM 9.6   < > 8.5* 8.6* 8.8*  PROT 6.4*  --   --   --   --   ALBUMIN 3.8  --   --   --   --   AST 36  --   --   --   --   ALT 39  --   --   --   --   ALKPHOS 57  --   --   --   --   BILITOT 0.6  --   --   --   --   GFRNONAA 59*   < > 59* >60 >60  GFRAA >60   < > >  60 >60 >60  ANIONGAP 8   < > 12 12 10    < > = values in this interval not displayed.     Hematology Recent Labs  Lab 09/07/18 0400 09/08/18 0423 09/10/18 0223  WBC 10.9* 12.7* 8.4  RBC 3.00* 3.10* 3.24*  HGB 9.4* 9.6* 10.0*  HCT 27.5* 29.1* 30.2*  MCV 91.7 93.9 93.2  MCH 31.3 31.0 30.9  MCHC 34.2 33.0 33.1  RDW 14.5 13.8 13.4  PLT 63* 89* 162    Cardiac EnzymesNo results for input(s): TROPONINI in the last 168 hours. No results for input(s): TROPIPOC in the last 168 hours.   BNPNo results for input(s): BNP, PROBNP in the last 168 hours.   DDimer No results for input(s): DDIMER in the last 168 hours.   Radiology    No results found.  Cardiac Studies   CATH  Right Heart Pressures Hemodynamic findings consistent with mitral valve regurgitation and mitral valve stenosis. Right atrial pressure- 10/11 Right ventricular pressure- 41/0 Pulmonary artery pressure- 38/13, mean 23 Pulmonary wedge pressure- A-wave equals 19, V wave equals 26, mean equals 17 LVEDP- 14 LV-wedge- 9.4 mm Cardiac output- 4.94 L/min   Diagnostic  Dominance: Right       Patient Profile     78 y.o. female female with valvular heart disease and persistent atrial fibrillation.  Assessment & Plan    BRADYCARDIA:  No evidence of recurrent atrial fib via my review of tele and EKG.  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  There is still heart block although grouped beating  would suggest some conduction.  Would hold off on resuming amiodarone at this point.   Consider leaving in the pacing wires although I do think the conduction system is "waking up."    HTN:  BP OK.    RHD status post AVR/MVR/TR repair:  Making nice progress.  Transferring today.  INR in the rise.  On warfarin and ASA.    For questions or updates, please contact [place] Please consult [email or web address] for contact info under Cardiology/STEMI.   Signed, Minus Breeding, MD  09/11/2018, 10:28 AM

## 2018-09-11 NOTE — Progress Notes (Addendum)
[facility] DAILY ICU PROGRESS NOTE                   [address]            [place] [address]          [phone]   6 Days Post-Op Procedure(s) (LRB): AORTIC VALVE REPLACEMENT (AVR) USING MAGNA EASE 21MM AOTIC BIOPROSTHESIS VALVE (N/A) MITRAL VALVE (MV) REPLACEMENT USING MAGNA MITRAL EASE 29MM BIOPROSTHESIS VALVE (N/A) MAZE (N/A) TRICUSPID VALVE REPAIR USING EDWARDS MC3 TRICUSPID ANNULOPLASTY RING SIZE T28 (N/A) SUPRACORONARY STRAIGHT GRAFT REPLACEMENT OF ASCENDING AORTA (N/A) Clipping Of Atrial Appendage  Total Length of Stay:  LOS: 10 days   Subjective: Sitting up on the side of the bed eating breakfast.  No new problems.  Says she is feeling stronger, swallowing better, voice is slowly improving.  Objective: Vital signs in last 24 hours: Temp:  [98.7 F (37.1 C)-99.3 F (37.4 C)] 99.1 F (37.3 C) (06/03 0806) Pulse Rate:  [31-150] 150 (06/03 0200) Cardiac Rhythm: Atrial fibrillation;Heart block (06/03 0400) Resp:  [14-24] 17 (06/03 0200) BP: (92-135)/(58-95) 118/77 (06/03 0200) SpO2:  [92 %-100 %] 93 % (06/03 0200)  Filed Weights   09/08/18 0400 09/09/18 0500 09/10/18 0522  Weight: 55.6 kg 55.2 kg 55 kg    Weight change:      Intake/Output from previous day: 06/02 0701 - 06/03 0700 In: 550 [P.O.:550] Out: -   Intake/Output this shift: No intake/output data recorded.  Current Meds: Scheduled Meds: . aspirin EC  81 mg Oral Daily  . bisacodyl  10 mg Oral Daily   Or  . bisacodyl  10 mg Rectal Daily  . Chlorhexidine Gluconate Cloth  6 each Topical Daily  . docusate sodium  200 mg Oral Daily  . ferrous [id number] C-folic acid  1 capsule Oral Q breakfast  . pantoprazole  40 mg Oral Daily  . sodium chloride flush  3 mL Intravenous Q12H  . Warfarin - Physician Dosing Inpatient   Does not apply q1800   Continuous Infusions: . sodium chloride     PRN Meds:.morphine injection, ondansetron (ZOFRAN) IV, oxyCODONE, sodium chloride flush,  traMADol  General appearance:alert, cooperative and no distress. Voice is stronger Neurologic:intact Heart:Atrial fibrillation with controlled ventricular rate Lungs:breath sounds clear. Abdomen:soft and non-tender Extremities:No edema.  Wound:Sternotomy incision is intact and healing with no sign of complication  Lab Results: CBC: Recent Labs    09/10/18 0223  WBC 8.4  HGB 10.0*  HCT 30.2*  PLT 162   BMET:  Recent Labs    09/09/18 0348 09/10/18 0223  NA 138 138  K 3.8 3.6  CL 102 101  CO2 24 27  GLUCOSE 104* 105*  BUN 23 22  CREATININE 0.86 0.78  CALCIUM 8.6* 8.8*    CMET: Lab Results  Component Value Date   WBC 8.4 09/10/2018   HGB 10.0 (L) 09/10/2018   HCT 30.2 (L) 09/10/2018   PLT 162 09/10/2018   GLUCOSE 105 (H) 09/10/2018   CHOL 195 08/03/2017   TRIG 107 08/03/2017   HDL 64 08/03/2017   LDLCALC 110 (H) 08/03/2017   ALT 39 09/05/2018   AST 36 09/05/2018   NA 138 09/10/2018   K 3.6 09/10/2018   CL 101 09/10/2018   CREATININE 0.78 09/10/2018   BUN 22 09/10/2018   CO2 27 09/10/2018   TSH 3.399 08/31/2018   INR 2.0 (H) 09/10/2018   HGBA1C 5.4 09/02/2018      PT/INR:  Recent  Labs    09/10/18 0223  LABPROT 22.4*  INR 2.0*   Radiology: No results found.   Assessment/Plan: S/P Procedure(s) (LRB): AORTIC VALVE REPLACEMENT (AVR) USING MAGNA EASE 21MM AOTIC BIOPROSTHESIS VALVE (N/A) MITRAL VALVE (MV) REPLACEMENT USING MAGNA MITRAL EASE 29MM BIOPROSTHESIS VALVE (N/A) MAZE (N/A) TRICUSPID VALVE REPAIR USING EDWARDS MC3 TRICUSPID ANNULOPLASTY RING SIZE T28 (N/A) SUPRACORONARY STRAIGHT GRAFT REPLACEMENT OF ASCENDING AORTA (N/A) Clipping Of Atrial Appendage  -POD5Atrial fibrillation with controlled ventricular rate bioprosthetic AVR, MVR, TV repair, MAZE, Clip LAA for rheumatic heart disease and chronic atrial fibrillation. Continues to make excellent progress with mobility and pulmonary hygiene. IINR pending.   D/C pacer wires if INR  appropriate. Transfer to progressive care, 4E.  -Post-op complete heart block, bradycardia--beta blocker and amiodarone have been suspended. Back in afib with CVR.    -Expected acute blood loss anemia- Hct has been stable, no new labs today.  -Post-op thrombocytopenia- resolving, no new labs today.  -Endo-not diabetic, glucose well controlled.  -DVT PPX-- now adequately anticoagulated with Coumadin.   -Disposition- OK to transfer to progressive care on 4E today.     Antony Odea, PA-C [phone] 09/11/2018 8:40 AM  I have seen and examined the patient and agree with the assessment and plan as outlined.  Doing very well.  Restart low dose amiodarone.  Awaiting bed on 4E for transfer.  Possible d/c home 2-3 days.  Rexene Alberts, MD 09/11/2018 9:35 AM

## 2018-09-11 NOTE — Progress Notes (Signed)
CARDIAC REHAB PHASE I   PRE:  Rate/Rhythm: Atrial fib 66  BP:  Supine: 94/77  Sitting: 118/78  Standing: 112/82   SaO2: 94% RA  MODE:  Ambulation: 470 ft   POST:  Rate/Rhythem: Afib 75  BP:  Supine: 118/83    SaO2: 100%  1340-1410 Patient ambulated in hallway with assistance times one gait steady. Tolerated well. Assisted to bathroom to void then back to bed with call bell within reach. Bed alarm on. Encouraged use of incentive spirometer.  Harrell Gave RN BSN

## 2018-09-12 LAB — CBC
HCT: 28.8 % — ABNORMAL LOW (ref 36.0–46.0)
Hemoglobin: 9.8 g/dL — ABNORMAL LOW (ref 12.0–15.0)
MCH: 31.8 pg (ref 26.0–34.0)
MCHC: 34 g/dL (ref 30.0–36.0)
MCV: 93.5 fL (ref 80.0–100.0)
Platelets: 195 10*3/uL (ref 150–400)
RBC: 3.08 MIL/uL — ABNORMAL LOW (ref 3.87–5.11)
RDW: 13.8 % (ref 11.5–15.5)
WBC: 7.6 10*3/uL (ref 4.0–10.5)
nRBC: 0 % (ref 0.0–0.2)

## 2018-09-12 LAB — BASIC METABOLIC PANEL
Anion gap: 8 (ref 5–15)
BUN: 16 mg/dL (ref 8–23)
CO2: 25 mmol/L (ref 22–32)
Calcium: 8.4 mg/dL — ABNORMAL LOW (ref 8.9–10.3)
Chloride: 105 mmol/L (ref 98–111)
Creatinine, Ser: 0.8 mg/dL (ref 0.44–1.00)
GFR calc Af Amer: >60 mL/min (ref >60)
GFR calc non Af Amer: >60 mL/min (ref >60)
Glucose, Bld: 104 mg/dL — ABNORMAL HIGH (ref 70–99)
Potassium: 3.9 mmol/L (ref 3.5–5.1)
Sodium: 138 mmol/L (ref 135–145)

## 2018-09-12 LAB — PROTIME-INR
INR: 2.2 — ABNORMAL HIGH (ref 0.8–1.2)
Prothrombin Time: 23.9 seconds — ABNORMAL HIGH (ref 11.4–15.2)

## 2018-09-12 MED ORDER — POTASSIUM CHLORIDE CRYS ER 20 MEQ PO TBCR
40.0000 meq | EXTENDED_RELEASE_TABLET | Freq: Once | ORAL | Status: AC
  Administered 2018-09-12: 40 meq via ORAL
  Filled 2018-09-12: qty 2

## 2018-09-12 MED ORDER — SALINE SPRAY 0.65 % NA SOLN
1.0000 | NASAL | Status: DC | PRN
  Filled 2018-09-12: qty 44

## 2018-09-12 MED ORDER — FUROSEMIDE 10 MG/ML IJ SOLN
20.0000 mg | Freq: Once | INTRAMUSCULAR | Status: AC
  Administered 2018-09-12: 20 mg via INTRAVENOUS
  Filled 2018-09-12: qty 2

## 2018-09-12 MED ORDER — FLUTICASONE PROPIONATE 50 MCG/ACT NA SUSP
2.0000 | Freq: Every day | NASAL | Status: DC
  Administered 2018-09-12 – 2018-09-13 (×2): 2 via NASAL
  Filled 2018-09-12: qty 16

## 2018-09-12 NOTE — Progress Notes (Addendum)
Progress Note  Patient Name: Hannah Roy Date of Encounter: 09/12/2018  Primary Cardiologist: Kate Sable, MD   Subjective   Pt sitting up in chair. Having mild chest soreness related to surgery. Still feels weak but slowly progressing. Breathing OK, she complains of nasal congestion. Nasal spray ordered by CTA.   Inpatient Medications    Scheduled Meds: . aspirin EC  81 mg Oral Daily  . bisacodyl  10 mg Oral Daily   Or  . bisacodyl  10 mg Rectal Daily  . Chlorhexidine Gluconate Cloth  6 each Topical Daily  . docusate sodium  200 mg Oral Daily  . ferrous [id number] C-folic acid  1 capsule Oral Q breakfast  . fluticasone  2 spray Each Nare Daily  . furosemide  20 mg Intravenous Once  . pantoprazole  40 mg Oral Daily  . potassium chloride  40 mEq Oral Once  . sodium chloride flush  3 mL Intravenous Q12H  . warfarin  2 mg Oral q1800  . Warfarin - Physician Dosing Inpatient   Does not apply q1800   Continuous Infusions: . sodium chloride     PRN Meds: morphine injection, ondansetron (ZOFRAN) IV, oxyCODONE, sodium chloride, sodium chloride flush, traMADol   Vital Signs    Vitals:   09/11/18 1225 09/11/18 1950 09/12/18 0017 09/12/18 0354  BP: 108/68 116/65 (!) 120/91 123/80  Pulse:   90 87  Resp: 20 19 19  (!) 22  Temp: 98.4 F (36.9 C) 97.7 F (36.5 C) 98.8 F (37.1 C) 98.5 F (36.9 C)  TempSrc: Oral Axillary Oral Oral  SpO2: 99% 97% 97% 96%  Weight:    55.4 kg  Height:        Intake/Output Summary (Last 24 hours) at 09/12/2018 0901 Last data filed at 09/12/2018 0656 Gross per 24 hour  Intake -  Output 450 ml  Net -450 ml   Last 3 Weights 09/12/2018 09/10/2018 09/09/2018  Weight (lbs) 122 lb 1.6 oz 121 lb 3.2 oz 121 lb 11.1 oz  Weight (kg) 55.384 kg 54.976 kg 55.2 kg      Telemetry    1st degree AVB with PRI .29, rates 70's later yesterday and then up to 90's as of ~2215 last night with improved AV conduction. Little variability.  - Personally  Reviewed  ECG    Sinus rhythm with 1st degree A-V block, 87 bpm, RBBB, QTC 529 more consistent AV conduction compared to 6/3 Confirmed by Jenkins [place]  - Personally Reviewed  Physical Exam   GEN: No acute distress.   Neck: No JVD Cardiac: RRR, 1/6 murmur left chest, No rubs, or gallops.  Respiratory: Clear to auscultation bilaterally. GI: Soft, nontender, non-distended  MS: No edema; No deformity. Neuro:  Nonfocal  Psych: Normal affect   Labs    Chemistry Recent Labs  Lab 09/09/18 0348 09/10/18 0223 09/12/18 0231  NA 138 138 138  K 3.8 3.6 3.9  CL 102 101 105  CO2 24 27 25   GLUCOSE 104* 105* 104*  BUN 23 22 16   CREATININE 0.86 0.78 0.80  CALCIUM 8.6* 8.8* 8.4*  GFRNONAA >60 >60 >60  GFRAA >60 >60 >60  ANIONGAP 12 10 8      Hematology Recent Labs  Lab 09/08/18 0423 09/10/18 0223 09/12/18 0231  WBC 12.7* 8.4 7.6  RBC 3.10* 3.24* 3.08*  HGB 9.6* 10.0* 9.8*  HCT 29.1* 30.2* 28.8*  MCV 93.9 93.2 93.5  MCH 31.0 30.9 31.8  MCHC 33.0 33.1 34.0  RDW 13.8  13.4 13.8  PLT 89* 162 195    Cardiac EnzymesNo results for input(s): TROPONINI in the last 168 hours. No results for input(s): TROPIPOC in the last 168 hours.   BNPNo results for input(s): BNP, PROBNP in the last 168 hours.   DDimer No results for input(s): DDIMER in the last 168 hours.   Radiology    No results found.  Cardiac Studies   R&L Heart Cath 08/29/18  Right Heart Pressures Hemodynamic findings consistent with mitral valve regurgitation and mitral valve stenosis. Right atrial pressure- 10/11 Right ventricular pressure- 41/0 Pulmonary artery pressure- 38/13, mean 23 Pulmonary wedge pressure- A-wave equals 19, V wave equals 26, mean equals 17 LVEDP- 14 LV-wedge- 9.4 mm Cardiac output- 4.94 L/min   Diagnostic  Dominance: Right        Patient Profile     78 y.o. 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 S/P bioprosthetic AVR, bioprosthetic MVR,  Tricuspid ring annuloplasty, Maze procedure, Ascending aorta graft 09/05/18. Seen for evaluation of bradycardia post op.   Assessment & Plan    Bradycardia: Improving AV conduction on EKG done today. Telemetry shows improved AV conduction as of 2215 last night. Still with 1st degree AV block and occ dropped beat. Conduction seems to be improving over time. No afib noted on tele.  Avoiding AV nodal blocking agents.   RHD S/P bioprosthetic AVR, bioprosthetic MVR, Tricuspid ring annuloplasty, Maze procedure, Ascending aorta graft, 09/05/18.  -On warfarin and aspirin. INR 2.2.  -Working with cardiac rehab. Progressing well.   Hypertension: BP currently well controlled.       For questions or updates, please contact [place] Please consult [email or web address] for contact info under        Signed, Daune Perch, NP  09/12/2018, 9:01 AM

## 2018-09-12 NOTE — Progress Notes (Signed)
Patient ambulated around entire unit (456ft). Patient tolerated well (without walker) and rested twice during walk. Patient back to room, resting in bed, and call light within reach. RN notified of walk.

## 2018-09-12 NOTE — Care Management Important Message (Signed)
Important Message  Patient Details  Name: Hannah Roy MRN: [id number] Date of Birth: March 29, 1941   Medicare Important Message Given:  Yes    Orbie Pyo 09/12/2018, 2:14 PM

## 2018-09-12 NOTE — Progress Notes (Signed)
CARDIAC REHAB PHASE I   PRE:  Rate/Rhythm: 77 Afib with PVCs  BP:  Sitting: 125/84      SaO2: 98 RA  MODE:  Ambulation: 450 ft   POST:  Rate/Rhythm: 86 Afib with PVCs  BP:  Sitting: 114/64    SaO2: 95 RA   Pt ambulated 412ft in hallway handheld assist with several short standing rest breaks. Pt c/o some SOB, coached through purse lipped breathing. Pt demonstrates 1125 on IS. Pt encouraged to walk twice more today, and continue IS use. Pt c/o some nausea upon return to room. Pt given cool rag, RN aware.  [id number] Rufina Falco, RN BSN 09/12/2018 11:11 AM

## 2018-09-12 NOTE — Progress Notes (Signed)
Patients third walk around unit today(477ft). Tolerated well and managed to ambulate (no walker) without stopping to rest. Patient is now back in chair and call light is within reach.

## 2018-09-12 NOTE — Progress Notes (Addendum)
[address]       [place] [address]             [phone]      7 Days Post-Op Procedure(s) (LRB): AORTIC VALVE REPLACEMENT (AVR) USING MAGNA EASE 21MM AOTIC BIOPROSTHESIS VALVE (N/A) MITRAL VALVE (MV) REPLACEMENT USING MAGNA MITRAL EASE 29MM BIOPROSTHESIS VALVE (N/A) MAZE (N/A) TRICUSPID VALVE REPAIR USING EDWARDS MC3 TRICUSPID ANNULOPLASTY RING SIZE T28 (N/A) SUPRACORONARY STRAIGHT GRAFT REPLACEMENT OF ASCENDING AORTA (N/A) Clipping Of Atrial Appendage Subjective: Feels well, some minor pain, c/o sinus congestion  Objective: Vital signs in last 24 hours: Temp:  [97.7 F (36.5 C)-98.9 F (37.2 C)] 98.5 F (36.9 C) (06/04 0354) Pulse Rate:  [55-90] 87 (06/04 0354) Cardiac Rhythm: Heart block (06/04 0700) Resp:  [19-22] 22 (06/04 0354) BP: (108-128)/(62-91) 123/80 (06/04 0354) SpO2:  [96 %-99 %] 96 % (06/04 0354) Weight:  [55.4 kg] 55.4 kg (06/04 0354)  Hemodynamic parameters for last 24 hours:    Intake/Output from previous day: 06/03 0701 - 06/04 0700 In: 180 [P.O.:180] Out: 450 [Urine:450] Intake/Output this shift: No intake/output data recorded.  General appearance: alert, cooperative and no distress Heart: regular rate and rhythm Lungs: mildly dim left base Abdomen: benign Extremities: no edema, well perfused Wound: incis healing well  Lab Results: Recent Labs    09/10/18 0223 09/12/18 0231  WBC 8.4 7.6  HGB 10.0* 9.8*  HCT 30.2* 28.8*  PLT 162 195   BMET:  Recent Labs    09/10/18 0223 09/12/18 0231  NA 138 138  K 3.6 3.9  CL 101 105  CO2 27 25  GLUCOSE 105* 104*  BUN 22 16  CREATININE 0.78 0.80  CALCIUM 8.8* 8.4*    PT/INR:  Recent Labs    09/12/18 0231  LABPROT 23.9*  INR 2.2*   ABG    Component Value Date/Time   PHART 7.367 09/06/2018 0627   HCO3 22.3 09/06/2018 0627   TCO2 23 09/06/2018 0627   ACIDBASEDEF 3.0 (H) 09/06/2018 0627   O2SAT 99.0 09/06/2018 0627   CBG (last 3)  No results for  input(s): GLUCAP in the last 72 hours.  Meds Scheduled Meds: . aspirin EC  81 mg Oral Daily  . bisacodyl  10 mg Oral Daily   Or  . bisacodyl  10 mg Rectal Daily  . Chlorhexidine Gluconate Cloth  6 each Topical Daily  . docusate sodium  200 mg Oral Daily  . ferrous [id number] C-folic acid  1 capsule Oral Q breakfast  . fluticasone  2 spray Each Nare Daily  . furosemide  20 mg Intravenous Once  . pantoprazole  40 mg Oral Daily  . potassium chloride  40 mEq Oral Once  . sodium chloride flush  3 mL Intravenous Q12H  . warfarin  2 mg Oral q1800  . Warfarin - Physician Dosing Inpatient   Does not apply q1800   Continuous Infusions: . sodium chloride     PRN Meds:.morphine injection, ondansetron (ZOFRAN) IV, oxyCODONE, sodium chloride flush, traMADol  Xrays No results found.  Assessment/Plan: S/P Procedure(s) (LRB): AORTIC VALVE REPLACEMENT (AVR) USING MAGNA EASE 21MM AOTIC BIOPROSTHESIS VALVE (N/A) MITRAL VALVE (MV) REPLACEMENT USING MAGNA MITRAL EASE 29MM BIOPROSTHESIS VALVE (N/A) MAZE (N/A) TRICUSPID VALVE REPAIR USING EDWARDS MC3 TRICUSPID ANNULOPLASTY RING SIZE T28 (N/A) SUPRACORONARY STRAIGHT GRAFT REPLACEMENT OF ASCENDING AORTA (N/A) Clipping Of Atrial Appendage  1 continues to do very well 2 SR with 1 deg AVB v junctional, hemodyn stable 3 ABL  anemia is very stable, no leukocytosis, thrombocytopenia resolved 4 weight very stable, does not appear to have significant volume overload- renal fxn normal 5 INR stable, cont current coumadin dose 6 cont routine pulm toilet and rehab 7 will give saline nasal spray for some sinus congestion c/o 8 sugars well controlled 9 home prob 1-2 days, cont rehab  LOS: 11 days    John Giovanni [facility] 09/12/2018 Pager [phone]  As above.  We will follow tomorrow for discharge planning.  Conduction continues to slowly improve.  I suspect she will go back into atrial fib in the future but currently holding amiodarone.   Jeneen Rinks  [facility]  11:36 AM  09/12/2018

## 2018-09-12 NOTE — Progress Notes (Signed)
      [address]       [place] [address]             [phone]     CARDIOTHORACIC SURGERY PROGRESS NOTE  7 Days Post-Op  S/P Procedure(s) (LRB): AORTIC VALVE REPLACEMENT (AVR) USING MAGNA EASE 21MM AOTIC BIOPROSTHESIS VALVE (N/A) MITRAL VALVE (MV) REPLACEMENT USING MAGNA MITRAL EASE 29MM BIOPROSTHESIS VALVE (N/A) MAZE (N/A) TRICUSPID VALVE REPAIR USING EDWARDS MC3 TRICUSPID ANNULOPLASTY RING SIZE T28 (N/A) SUPRACORONARY STRAIGHT GRAFT REPLACEMENT OF ASCENDING AORTA (N/A) Clipping Of Atrial Appendage  Subjective: Woke up early this morning feeling "congestion in my nose" for which she contacted the nurse somewhat urgently.  No significant shortness of breath.  No pain in chest.  Appetite good.  Ambulating without much assistance  Objective: Vital signs in last 24 hours: Temp:  [97.7 F (36.5 C)-98.8 F (37.1 C)] 98.5 F (36.9 C) (06/04 0354) Pulse Rate:  [71-90] 71 (06/04 1050) Cardiac Rhythm: Heart block (06/04 0700) Resp:  [15-25] 24 (06/04 1530) BP: (91-125)/(57-91) 100/66 (06/04 1530) SpO2:  [96 %-98 %] 98 % (06/04 1050) Weight:  [55.4 kg] 55.4 kg (06/04 0354)  Physical Exam:  Rhythm:   Sinus with first-degree block  Breath sounds: Clear  Heart sounds:  Regular rate and rhythm  Incisions:  Healing nicely  Abdomen:  Soft nontender  Extremities:  Warm and well-perfused, no edema   Intake/Output from previous day: 06/03 0701 - 06/04 0700 In: 180 [P.O.:180] Out: 450 [Urine:450] Intake/Output this shift: No intake/output data recorded.  Lab Results: Recent Labs    09/10/18 0223 09/12/18 0231  WBC 8.4 7.6  HGB 10.0* 9.8*  HCT 30.2* 28.8*  PLT 162 195   BMET:  Recent Labs    09/10/18 0223 09/12/18 0231  NA 138 138  K 3.6 3.9  CL 101 105  CO2 27 25  GLUCOSE 105* 104*  BUN 22 16  CREATININE 0.78 0.80  CALCIUM 8.8* 8.4*    CBG (last 3)  No results for input(s): GLUCAP in the last 72 hours. PT/INR:   Recent Labs    09/12/18  0231  LABPROT 23.9*  INR 2.2*    CXR:  N/A  EKG: NSR w/ 1st degree AV block    Assessment/Plan: S/P Procedure(s) (LRB): AORTIC VALVE REPLACEMENT (AVR) USING MAGNA EASE 21MM AOTIC BIOPROSTHESIS VALVE (N/A) MITRAL VALVE (MV) REPLACEMENT USING MAGNA MITRAL EASE 29MM BIOPROSTHESIS VALVE (N/A) MAZE (N/A) TRICUSPID VALVE REPAIR USING EDWARDS MC3 TRICUSPID ANNULOPLASTY RING SIZE T28 (N/A) SUPRACORONARY STRAIGHT GRAFT REPLACEMENT OF ASCENDING AORTA (N/A) Clipping Of Atrial Appendage  Patient clinically appears to be doing quite well.  For the most part she remains in sinus rhythm with long first-degree AV block.  She has not had any episodes of bradycardia for several days.  Will DC pacing wires today.  Possible DC home 1-2 days.   Rexene Alberts, MD 09/12/2018 4:27 PM

## 2018-09-12 NOTE — Progress Notes (Signed)
EPWs removed per order.  Pt tolerated well, all tips intact, atrial site with mild oozing prior to removal.  BP low but stable, see flowsheets.  Pt understands bedrest for one hour.  CCMD notified, will monitor closely

## 2018-09-13 LAB — PROTIME-INR
INR: 1.9 — ABNORMAL HIGH (ref 0.8–1.2)
Prothrombin Time: 21.3 seconds — ABNORMAL HIGH (ref 11.4–15.2)

## 2018-09-13 MED ORDER — WARFARIN SODIUM 2.5 MG PO TABS: 2.5000 mg | ORAL_TABLET | Freq: Every day | ORAL | 2 refills | Status: DC

## 2018-09-13 MED ORDER — ASPIRIN 81 MG PO TBEC: 81.0000 mg | DELAYED_RELEASE_TABLET | Freq: Every day | ORAL | Status: DC

## 2018-09-13 MED ORDER — FE FUMARATE-B12-VIT C-FA-IFC PO CAPS: 1.0000 | ORAL_CAPSULE | Freq: Every day | ORAL | 0 refills | Status: DC

## 2018-09-13 MED ORDER — WARFARIN SODIUM 2.5 MG PO TABS: 2.5000 mg | ORAL_TABLET | Freq: Every day | ORAL | Status: DC

## 2018-09-13 MED ORDER — TRAMADOL HCL 50 MG PO TABS: 50.0000 mg | ORAL_TABLET | ORAL | 0 refills | Status: DC | PRN

## 2018-09-13 NOTE — TOC Transition Note (Signed)
Transition of Care [facility]) - CM/SW Discharge Note Marvetta Gibbons RN, BSN Transitions of Care Unit 4E- RN Case Manager [phone]  Patient Details  Name: Hannah Roy MRN: [id number] Date of Birth: 12/01/1940  Transition of Care [facility]) CM/SW Contact:  Dawayne Patricia, RN Phone Number: 09/13/2018, 10:42 AM   Clinical Narrative:    Pt s/p multiple valve replacments with  MAZE 5/28- stable for transition home with spouse, no CM needs noted for transition home.    Final next level of care: Home/Self Care Barriers to Discharge: No Barriers Identified   Patient Goals and CMS Choice Patient states their goals for this hospitalization and ongoing recovery are:: home   Choice offered to / list presented to : NA  Discharge Placement  Home with spouse                     Discharge Plan and Services In-house Referral: NA Discharge Planning Services: CM Consult Post Acute Care Choice: NA          DME Arranged: N/A DME Agency: NA       HH Arranged: NA HH Agency: NA        Social Determinants of Health (SDOH) Interventions     Readmission Risk Interventions Readmission Risk Prevention Plan 09/13/2018  Transportation Screening Complete  PCP or Specialist Appt within 5-7 Days Complete  Home Care Screening Complete  Medication Review (RN CM) Complete  Some recent data might be hidden

## 2018-09-13 NOTE — Progress Notes (Addendum)
Progress Note  Patient Name: Hannah Roy Date of Encounter: 09/13/2018  Primary Cardiologist: Kate Sable, MD  Subjective   She says she feels ready to go home. A little weak but feeling better every day. No CP or SOB. No dizziness. Appears to be currently in AF. 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  Inpatient Medications    Scheduled Meds: . aspirin EC  81 mg Oral Daily  . bisacodyl  10 mg Oral Daily   Or  . bisacodyl  10 mg Rectal Daily  . Chlorhexidine Gluconate Cloth  6 each Topical Daily  . docusate sodium  200 mg Oral Daily  . ferrous [id number] C-folic acid  1 capsule Oral Q breakfast  . fluticasone  2 spray Each Nare Daily  . pantoprazole  40 mg Oral Daily  . sodium chloride flush  3 mL Intravenous Q12H  . warfarin  2.5 mg Oral q1800  . Warfarin - Physician Dosing Inpatient   Does not apply q1800   Continuous Infusions: . sodium chloride     PRN Meds: morphine injection, ondansetron (ZOFRAN) IV, oxyCODONE, sodium chloride, sodium chloride flush, traMADol   Vital Signs    Vitals:   09/12/18 1924 09/12/18 2323 09/13/18 0539 09/13/18 0831  BP: 101/64 118/77 102/85 98/62  Pulse:  90  67  Resp: 18 20 20 18   Temp: 98.2 F (36.8 C) 98.8 F (37.1 C) 97.8 F (36.6 C) 98.1 F (36.7 C)  TempSrc: Oral Oral Oral Oral  SpO2: 99% 98% 98% 98%  Weight:   54.4 kg   Height:        Intake/Output Summary (Last 24 hours) at 09/13/2018 1004 Last data filed at 09/13/2018 0800 Gross per 24 hour  Intake 120 ml  Output -  Net 120 ml   Last 3 Weights 09/13/2018 09/12/2018 09/10/2018  Weight (lbs) 120 lb 122 lb 1.6 oz 121 lb 3.2 oz  Weight (kg) 54.432 kg 55.384 kg 54.976 kg     Telemetry    Currently appears in rate controlled AF but otherwise appears to have NSR with long first degree AVB and 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  HEENT: Normocephalic, atraumatic, sclera non-icteric. Neck: No JVD or bruits. Cardiac: irregularly irregular, rate controlled 1/6 SEM over left chest, no rubs or gallops. S/p sternotomy. Radials/DP/PT 1+ and equal bilaterally.  Respiratory: Clear to auscultation bilaterally. Breathing is unlabored. GI: Soft, nontender, non-distended, BS +x 4. MS: no deformity. Extremities: No clubbing or cyanosis. No edema. Distal pedal pulses are 2+ and equal bilaterally. Neuro:  AAOx3. Follows commands. Psych:  Responds to questions appropriately with a normal affect.  Labs    Chemistry Recent Labs  Lab 09/09/18 0348 09/10/18 0223 09/12/18 0231  NA 138 138 138  K 3.8 3.6 3.9  CL 102 101 105  CO2 24 27 25   GLUCOSE 104* 105* 104*  BUN 23 22 16   CREATININE 0.86 0.78 0.80  CALCIUM 8.6* 8.8* 8.4*  GFRNONAA >60 >60 >60  GFRAA >60 >60 >60  ANIONGAP 12 10 8      Hematology Recent Labs  Lab 09/08/18 0423 09/10/18 0223 09/12/18 0231  WBC 12.7* 8.4 7.6  RBC 3.10* 3.24* 3.08*  HGB 9.6* 10.0* 9.8*  HCT 29.1* 30.2* 28.8*  MCV 93.9 93.2 93.5  MCH 31.0 30.9 31.8  MCHC 33.0 33.1 34.0  RDW  13.8 13.4 13.8  PLT 89* 162 195    Cardiac EnzymesNo results for input(s): TROPONINI in the last 168 hours. No results for input(s): TROPIPOC in the last 168 hours.   BNPNo results for input(s): BNP, PROBNP in the last 168 hours.   DDimer No results for input(s): DDIMER in the last 168 hours.   Radiology    No results found.  Patient Profile     78 y.o. female with persistent atrial fib dx 04/2018, progressive valvular disease and mild CAD by cath 08/2018 who was admitted with recurrent dizziness, falls and SOB. R/LHC with 40% prox LAD and 40% mid Cx. 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 On 5/28 she underwent bioprosthetic AV replacement, bioprosthetic MV replacement, tricuspid ring annuloplasty, repair of thoracic  aortic aneurysm and MAZE with LAA clipping. Post-op course notable for ABL anemia, thrombocytopenia and bradycardia.  Assessment & Plan    1. Bradycardia in the setting of recent persistent atrial fib and valvular surgery - will review telemetry with Dr. Percival Spanish to see if he feels satisfactory to continue to monitor. 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 She is in AF this AM but it sounds like Dr. Percival Spanish said he would anticipate she would revert back. Asymptomatic.  2. S/p bioprosthetic AV replacement, bioprosthetic MV replacement, tricuspid ring annuloplasty, repair of thoracic aortic aneurysm and MAZE with LAA clipping - per surgical team. Pt will need reminders of SBE ppx at routine appointments as well. On ASA/Warfarin.  Anticipate OK for discharge/  She has Coumadin f/u 6/11 in [facility] [place] not yet doing), and in-office f/u with Jerilynn Mages. [facility] Public 6/15 in [place]. Discussed with patient.  For questions or updates, please contact [place] Please consult [email or web address] for contact info under Cardiology/STEMI.  Signed, Charlie Pitter, PA-C 09/13/2018, 10:04 AM    History and all data above reviewed.  Patient examined.  I agree with the findings as above. She feels well and wants to go home.  The patient exam reveals [id number]  ,  Lungs: Clear  ,  Abd: Positive bowel sounds, no rebound no guarding, Ext No edema  .  All available labs, radiology testing, previous records reviewed. Agree with documented assessment and plan. Atrial fib/HB:  She, as expected, is having PAF, with intermittent SB, first degree AV block and some higher degree block.  I think she is fine to go home but with close follow up and a monitor in the next couple of weeks.  No AV nodal blocking agent.  She has a FitBit and can watch her HR for tachycardia and call us with symptomatic bradycardia if that happens.    Jeneen Rinks Graylyn Bunney  10:54 AM   09/13/2018

## 2018-09-13 NOTE — Discharge Instructions (Signed)
Discharge Instructions:  1. You may shower, please wash incisions daily with soap and water and keep dry.  If you wish to cover wounds with dressing you may do so but please keep clean and change daily.  No tub baths or swimming until incisions have completely healed.  If your incisions become red or develop any drainage please call our office at [phone]  2. No Driving until cleared by Dr. Roxy Manns office and you are no longer using narcotic pain medications  3. Monitor your weight daily.. Please use the same scale and weigh at same time... If you gain 5-10 lbs in 48 hours with associated lower extremity swelling, please contact our office at [phone]  4. Fever of 101.5 for at least 24 hours with no source, please contact our office at [phone]  5. Activity- up as tolerated, please walk at least 3 times per day.  Avoid strenuous activity, no lifting, pushing, or pulling with your arms over 8-10 lbs for a minimum of 6 weeks  6. If any questions or concerns arise, please do not hesitate to contact our office at [phone]  ---------------- Information on my medicine - Coumadin   (Warfarin)  Why was Coumadin prescribed for you? Coumadin was prescribed for you because you have a blood clot or a medical condition that can cause an increased risk of forming blood clots. Blood clots can cause serious health problems by blocking the flow of blood to the heart, lung, or brain. Coumadin can prevent harmful blood clots from forming. As a reminder your indication for Coumadin is:   Blood Clot Prevention After Heart Valve Surgery  What test will check on my response to Coumadin? While on Coumadin (warfarin) you will need to have an INR test regularly to ensure that your dose is keeping you in the desired range. The INR (international normalized ratio) number is calculated from the result of the laboratory test called prothrombin time (PT).  If an INR APPOINTMENT HAS NOT ALREADY BEEN MADE FOR  YOU please schedule an appointment to have this lab work done by your health care provider within 7 days. Your INR goal is usually a number between:  2 to 3 or your provider may give you a more narrow range like 2-2.5.  Ask your health care provider during an office visit what your goal INR is.  What  do you need to  know  About  COUMADIN? Take Coumadin (warfarin) exactly as prescribed by your healthcare provider about the same time each day.  DO NOT stop taking without talking to the doctor who prescribed the medication.  Stopping without other blood clot prevention medication to take the place of Coumadin may increase your risk of developing a new clot or stroke.  Get refills before you run out.  What do you do if you miss a dose? If you miss a dose, take it as soon as you remember on the same day then continue your regularly scheduled regimen the next day.  Do not take two doses of Coumadin at the same time.  Important Safety Information A possible side effect of Coumadin (Warfarin) is an increased risk of bleeding. You should call your healthcare provider right away if you experience any of the following: ? Bleeding from an injury or your nose that does not stop. ? Unusual colored urine (red or dark brown) or unusual colored stools (red or black). ? Unusual bruising for unknown reasons. ? A serious fall or if you hit your head (even  if there is no bleeding).  Some foods or medicines interact with Coumadin (warfarin) and might alter your response to warfarin. To help avoid this: ? Eat a balanced diet, maintaining a consistent amount of Vitamin K. ? Notify your provider about major diet changes you plan to make. ? Avoid alcohol or limit your intake to 1 drink for women and 2 drinks for men per day. (1 drink is 5 oz. wine, 12 oz. beer, or 1.5 oz. liquor.)  Make sure that ANY health care provider who prescribes medication for you knows that you are taking Coumadin (warfarin).  Also make sure  the healthcare provider who is monitoring your Coumadin knows when you have started a new medication including herbals and non-prescription products.  Coumadin (Warfarin)  Major Drug Interactions  Increased Warfarin Effect Decreased Warfarin Effect  Alcohol (large quantities) Antibiotics (esp. Septra/Bactrim, Flagyl, Cipro) Amiodarone (Cordarone) Aspirin (ASA) Cimetidine (Tagamet) Megestrol (Megace) NSAIDs (ibuprofen, naproxen, etc.) Piroxicam (Feldene) Propafenone (Rythmol SR) Propranolol (Inderal) Isoniazid (INH) Posaconazole (Noxafil) Barbiturates (Phenobarbital) Carbamazepine (Tegretol) Chlordiazepoxide (Librium) Cholestyramine (Questran) Griseofulvin Oral Contraceptives Rifampin Sucralfate (Carafate) Vitamin K   Coumadin (Warfarin) Major Herbal Interactions  Increased Warfarin Effect Decreased Warfarin Effect  Garlic Ginseng Ginkgo biloba Coenzyme Q10 Green tea St. Johns wort    Coumadin (Warfarin) FOOD Interactions  Eat a consistent number of servings per week of foods HIGH in Vitamin K (1 serving =  cup)  Collards (cooked, or boiled & drained) Kale (cooked, or boiled & drained) Mustard greens (cooked, or boiled & drained) Parsley *serving size only =  cup Spinach (cooked, or boiled & drained) Swiss chard (cooked, or boiled & drained) Turnip greens (cooked, or boiled & drained)  Eat a consistent number of servings per week of foods MEDIUM-HIGH in Vitamin K (1 serving = 1 cup)  Asparagus (cooked, or boiled & drained) Broccoli (cooked, boiled & drained, or raw & chopped) Brussel sprouts (cooked, or boiled & drained) *serving size only =  cup Lettuce, raw (green leaf, endive, romaine) Spinach, raw Turnip greens, raw & chopped   These websites have more information on Coumadin (warfarin):  FailFactory.se; VeganReport.com.au;

## 2018-09-13 NOTE — Progress Notes (Signed)
CARDIAC REHAB PHASE I   PRE:  Rate/Rhythm: 59 SR  BP:  Sitting: 94/65        SaO2: 99%  MODE:  Ambulation: 472 ft   POST:  Rate/Rhythm: 75 SR  BP:  Sitting: 93/68      SaO2: 97%  Pt ambulated with one assist 472 ft. Pt stopped for one rest break for less than one minute. Pt denied SOB. Pt educated on IS use, Sternal precautions, incision care, exercise guidelines, restrictions, nutrition, and CRP II. Pt was responsive to handouts and information given. Encouraged Pt to walk two more times today. Will refer Pt to CRP II [place].  0905-1000  Jeralyn Bennett BS, ACSM CEP 09/13/2018  9:53 AM

## 2018-09-13 NOTE — Progress Notes (Addendum)
8 Days Post-Op Procedure(s) (LRB): AORTIC VALVE REPLACEMENT (AVR) USING MAGNA EASE 21MM AOTIC BIOPROSTHESIS VALVE (N/A) MITRAL VALVE (MV) REPLACEMENT USING MAGNA MITRAL EASE 29MM BIOPROSTHESIS VALVE (N/A) MAZE (N/A) TRICUSPID VALVE REPAIR USING EDWARDS MC3 TRICUSPID ANNULOPLASTY RING SIZE T28 (N/A) SUPRACORONARY STRAIGHT GRAFT REPLACEMENT OF ASCENDING AORTA (N/A) Clipping Of Atrial Appendage Subjective: Says she feels well and is eager to go home.  Walking independently. No dyspnea on RA.  Objective: Vital signs in last 24 hours: Temp:  [97.8 F (36.6 C)-98.8 F (37.1 C)] 98.1 F (36.7 C) (06/05 0831) Pulse Rate:  [67-90] 67 (06/05 0831) Cardiac Rhythm: Heart block;Bundle branch block (06/05 0700) Resp:  [15-25] 18 (06/05 0831) BP: (91-125)/(57-85) 98/62 (06/05 0831) SpO2:  [98 %-99 %] 98 % (06/05 0831) Weight:  [54.4 kg] 54.4 kg (06/05 0539)  Hemodynamic parameters for last 24 hours:    Intake/Output from previous day: No intake/output data recorded. Intake/Output this shift: Total I/O In: 120 [P.O.:120] Out: -   General appearance: alert, cooperative and no distress Neurologic: intact Heart: SR with 1st degree AVB, occasional dropped beat.  Lungs: Breath sounds clear. Extremities: All well perfused with no edema. Wound: Sternal incision intact and dry. CT sites clean and appear to be well-healed.   Lab Results: Recent Labs    09/12/18 0231  WBC 7.6  HGB 9.8*  HCT 28.8*  PLT 195   BMET:  Recent Labs    09/12/18 0231  NA 138  K 3.9  CL 105  CO2 25  GLUCOSE 104*  BUN 16  CREATININE 0.80  CALCIUM 8.4*    PT/INR:  Recent Labs    09/13/18 0300  LABPROT 21.3*  INR 1.9*   ABG    Component Value Date/Time   PHART 7.367 09/06/2018 0627   HCO3 22.3 09/06/2018 0627   TCO2 23 09/06/2018 0627   ACIDBASEDEF 3.0 (H) 09/06/2018 0627   O2SAT 99.0 09/06/2018 0627   CBG (last 3)  No results for input(s): GLUCAP in the last 72  hours.  Assessment/Plan: S/P Procedure(s) (LRB): AORTIC VALVE REPLACEMENT (AVR) USING MAGNA EASE 21MM AOTIC BIOPROSTHESIS VALVE (N/A) MITRAL VALVE (MV) REPLACEMENT USING MAGNA MITRAL EASE 29MM BIOPROSTHESIS VALVE (N/A) MAZE (N/A) TRICUSPID VALVE REPAIR USING EDWARDS MC3 TRICUSPID ANNULOPLASTY RING SIZE T28 (N/A) SUPRACORONARY STRAIGHT GRAFT REPLACEMENT OF ASCENDING AORTA (N/A) Clipping Of Atrial Appendage    LOS: 12 days   -POD8 bioprosthetic AVR, MVR, TV repair, MAZE, Clip LAA for rheumatic heart disease and chronic atrial fibrillation. Making excellent progress with mobility.   -H/O chronic afib. Complete heart block, bradycardia early post-op. Metoprolol and amiodarone suspended. She is currently in SR with 1st degree AVB. INR 1.9  trending down on coumadin 2mg  daily. Will discharge on coumadin 2.5mg /day.   -Expected acute blood loss anemia- Hematocrit is stable.   -Post-op thrombocytopenia--Plt count recovering.   Discharge today. Instructions given. To follow up with coumadin clinic on Wednesday 6/10.  Antony Odea, PA-C [phone] 09/13/2018   I have seen and examined the patient and agree with the assessment and plan as outlined.  D/C home today.  Instructions given  Rexene Alberts, MD 09/13/2018 12:27 PM

## 2018-09-13 NOTE — Consult Note (Signed)
   [facility] CM Inpatient Consult   09/13/2018  Hannah Roy July 31, 1940 [id number]    Patient reviewed forlong length of stay, 21% medium risk score for unplanned readmission, with 2 hospitalizations and an ED visit in the past 6 months; andto check if potential [place] [facility] services are needed with herMedicare NextGen benefits.  Chart review and MD's note on6/2/20 reveal as follows: Patient is a 78 year old female originally from [place] recently diagnosed with rheumatic heart disease and recurrent persistent atrial fibrillation;   who was readmitted to the hospital with tachy-palpitations, dizzy spells, recurrent falls and shortness of breathc/w acute on chronic diastolic congestive heart failurewho has been referred for surgical consultation. (Rheumatic heart disease, essential hypertension, atrial fibrillation with RVR;  status post aortic + mitral valve replacement with bioprosthetic valves + tricuspid valve repair + maze procedure + repair ascending aortic aneurysm)  Patient's primary care provider isDr. Nicki Reaper Luking with [facility], listed as providing transitionof care follow-up.  Called to speak to patient in the room twice, to check for potential needs but no reply. Review of transition of care CM note for disposition states that patient is stable for transition home with spouse and no further transition of care needs or barriers identified. Per cardiac rehab and NT notes, patient tolerates well and manages to ambulate around the unit (no walker).  Patient may benefit from EMMI calls to follow-up her continued recoveryafter discharge. Referral made for [facility] calls post discharge.   For questions and additional information, please call:  Efrem Pitstick A. Martise Waddell, BSN, RN-BC [facility] Liaison Cell: [phone]

## 2018-09-16 ENCOUNTER — Other Ambulatory Visit (HOSPITAL_COMMUNITY): Payer: Self-pay | Admitting: Thoracic Surgery (Cardiothoracic Vascular Surgery)

## 2018-09-16 ENCOUNTER — Other Ambulatory Visit (HOSPITAL_COMMUNITY): Payer: Self-pay

## 2018-09-16 DIAGNOSIS — I350 Nonrheumatic aortic (valve) stenosis: Secondary | ICD-10-CM

## 2018-09-17 ENCOUNTER — Telehealth: Payer: Self-pay | Admitting: *Deleted

## 2018-09-17 DIAGNOSIS — R001 Bradycardia, unspecified: Secondary | ICD-10-CM

## 2018-09-17 MED FILL — Lidocaine HCl Local Soln Prefilled Syringe 100 MG/5ML (2%): INTRAMUSCULAR | Qty: 25 | Status: AC

## 2018-09-17 MED FILL — Sodium Bicarbonate IV Soln 8.4%: INTRAVENOUS | Qty: 50 | Status: AC

## 2018-09-17 MED FILL — Heparin Sodium (Porcine) Inj 1000 Unit/ML: INTRAMUSCULAR | Qty: 10 | Status: AC

## 2018-09-17 MED FILL — Sodium Chloride IV Soln 0.9%: INTRAVENOUS | Qty: 3000 | Status: AC

## 2018-09-17 MED FILL — Electrolyte-R (PH 7.4) Solution: INTRAVENOUS | Qty: 7000 | Status: AC

## 2018-09-17 MED FILL — Mannitol IV Soln 20%: INTRAVENOUS | Qty: 1000 | Status: AC

## 2018-09-17 MED FILL — Magnesium Sulfate Inj 50%: INTRAMUSCULAR | Qty: 10 | Status: AC

## 2018-09-17 MED FILL — Albumin, Human Inj 5%: INTRAVENOUS | Qty: 500 | Status: AC

## 2018-09-17 MED FILL — Potassium Chloride Inj 2 mEq/ML: INTRAVENOUS | Qty: 40 | Status: AC

## 2018-09-17 NOTE — Telephone Encounter (Signed)
-----   Message from Erma Heritage, [place] sent at 09/13/2018 11:48 AM EDT ----- Regarding: 2-week monitor Hi Kisha,  This patient needs a 2-week monitor for bradycardia per [name]. She has follow-up with Selinda Eon already scheduled for 6/15 so if she has any difficultly applying it, could bring it then. Her visit is a post-op visit so still needs to keep that and then will need to follow-up on results later.   Thanks,  [place]

## 2018-09-18 ENCOUNTER — Other Ambulatory Visit: Payer: Self-pay

## 2018-09-18 NOTE — Progress Notes (Signed)
Cardiology Office Note    Date:  09/23/2018   ID:  Hannah Roy, DOB 09-27-1940, MRN [id number]  PCP:  Kathyrn Drown, MD  Cardiologist: Kate Sable, MD EPS: None  No chief complaint on file.   History of Present Illness:  Hannah Roy is a 78 y.o. female  with persistent atrial fib dx 04/2018, progressive valvular disease and mild CAD by cath 08/2018 who was admitted with recurrent dizziness, falls and SOB. R/LHC with 40% prox LAD and 40% mid Cx. 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 On 5/28 she underwent bioprosthetic AV replacement, bioprosthetic MV replacement, tricuspid ring annuloplasty, repair of thoracic aortic aneurysm and MAZE with LAA clipping. Post-op course notable for ABL anemia, thrombocytopenia and bradycardia with PAF and intermittent 2nd degree AV block.  Patient complains of loss of appetitie and not eating much. Has lost 4-5 lbs. Was walking 15 min twice daily. Now doing 10 min three times a day. Complains of nausea, trouble sleeping, constipation. Has a fitbit. HR gets up to 110 when moving around. 80-90 at rest. No slow rates.     Past Medical History:  Diagnosis Date  . Aortic atherosclerosis ([place])   . Aortic insufficiency, rheumatic   . 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 Atrial fibrillation ([place]) 05/10/2018  . Colon polyps   . Essential hypertension, benign   . History of seasonal allergies   . Hypertension   . Hypothyroidism   . Mitral stenosis with regurgitation, rheumatic   . Osteopenia 2006  . Persistent atrial fibrillation   . 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   21 mm [facility] Ease stented bovine pericardial tissue valve  . S/P ascending aortic aneurysm repair 09/05/2018   28 mm Hemashield platinum supracoronary straight graft  . S/P Maze operation for atrial fibrillation 09/05/2018   Complete bilateral atrial lesion set using bipolar  radiofrequency and cryothermy ablation with clipping of LA appendage  . 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   29 mm [facility] Mitral stented bovine pericardial tissue valve  . 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 Tricuspid regurgitation     Past Surgical History:  Procedure Laterality Date  . AORTIC VALVE REPLACEMENT N/A 09/05/2018   Procedure: AORTIC VALVE REPLACEMENT (AVR) USING MAGNA EASE 21MM AOTIC BIOPROSTHESIS VALVE;  Surgeon: Rexene Alberts, MD;  Location: [place];  Service: Open Heart Surgery;  Laterality: N/A;  . APPENDECTOMY    . CLIPPING OF ATRIAL APPENDAGE  09/05/2018   Procedure: Clipping Of Atrial Appendage;  Surgeon: Rexene Alberts, MD;  Location: [place];  Service: Open Heart Surgery;;  . COLONOSCOPY  03/23/2011   repeat in 5 years,Procedure: COLONOSCOPY;  Surgeon: Rogene Houston, MD;  [facility];  Service: Endoscopy;  Laterality: N/A;  1:00  . COLONOSCOPY N/A 11/09/2016   Procedure: COLONOSCOPY;  Surgeon: Rogene Houston, MD;  [facility];  Service: Endoscopy;  Laterality: N/A;  1030  . MAZE N/A 09/05/2018   Procedure: MAZE;  Surgeon: Rexene Alberts, MD;  Location: [place];  Service: Open Heart Surgery;  Laterality: N/A;  . MITRAL VALVE REPLACEMENT N/A 09/05/2018   Procedure: MITRAL VALVE (MV) REPLACEMENT USING MAGNA MITRAL EASE 29MM BIOPROSTHESIS VALVE;  Surgeon: Rexene Alberts, MD;  Location: [place];  Service: Open Heart Surgery;  Laterality: N/A;  .  REPLACEMENT ASCENDING AORTA N/A 09/05/2018   Procedure: SUPRACORONARY STRAIGHT GRAFT REPLACEMENT OF ASCENDING AORTA;  Surgeon: Rexene Alberts, MD;  Location: [place];  Service: Open Heart Surgery;  Laterality: N/A;  . RIGHT/LEFT HEART CATH AND CORONARY ANGIOGRAPHY N/A 08/29/2018   Procedure: RIGHT/LEFT HEART CATH AND CORONARY ANGIOGRAPHY;  Surgeon: Lorretta Harp, MD;  Location: [place] [facility];  Service: Cardiovascular;   Laterality: N/A;  . TEE WITHOUT CARDIOVERSION N/A 08/29/2018   Procedure: TRANSESOPHAGEAL ECHOCARDIOGRAM (TEE);  Surgeon: Skeet Latch, MD;  Location: [place];  Service: Cardiovascular;  Laterality: N/A;  . TRICUSPID VALVE REPLACEMENT N/A 09/05/2018   Procedure: TRICUSPID VALVE REPAIR USING EDWARDS MC3 TRICUSPID ANNULOPLASTY RING SIZE T28;  Surgeon: Rexene Alberts, MD;  Location: [place];  Service: Open Heart Surgery;  Laterality: N/A;  . TUBAL LIGATION      Current Medications: Current Meds  Medication Sig  . aspirin EC 81 MG EC tablet Take 1 tablet (81 mg total) by mouth daily.  . chlorpheniramine (CHLOR-TRIMETON) 4 MG tablet Take 4 mg by mouth daily as needed for allergies.  . ferrous [id number] C-folic acid (TRINSICON / FOLTRIN) capsule Take 1 capsule by mouth daily with breakfast.  . traMADol (ULTRAM) 50 MG tablet Take 1 tablet (50 mg total) by mouth every 4 (four) hours as needed for moderate pain.  [name] warfarin (COUMADIN) 2.5 MG tablet Take 1 tablet (2.5 mg total) by mouth daily at 6 PM.     Allergies:   Bee venom, Boniva [ibandronic acid], Lisinopril, and Latex   Social History   Socioeconomic History  . Marital status: Married    Spouse name: Not on file  . Number of children: Not on file  . Years of education: Not on file  . Highest education level: Not on file  Occupational History  . Not on file  Social Needs  . Financial resource strain: Not on file  . Food insecurity    Worry: Not on file    Inability: Not on file  . Transportation needs    Medical: Not on file    Non-medical: Not on file  Tobacco Use  . Smoking status: Former Smoker    Packs/day: 0.50    Years: 10.00    Pack years: 5.00  . Smokeless tobacco: Never Used  Substance and Sexual Activity  . Alcohol use: No  . Drug use: No  . Sexual activity: Not on file  Lifestyle  . Physical activity    Days per week: Not on file    Minutes per session: Not on file  . Stress: Not on file   Relationships  . Social Herbalist on phone: Not on file    Gets together: Not on file    Attends religious service: Not on file    Active member of club or organization: Not on file    Attends meetings of clubs or organizations: Not on file    Relationship status: Not on file  Other Topics Concern  . Not on file  Social History Narrative  . Not on file     Family History:  The patient's family history includes Colon cancer in her daughter.   ROS:   Please see the history of present illness.    Review of Systems  Constitution: Negative.  HENT: Negative.   Eyes: Negative.   Cardiovascular: Positive for chest pain.  Respiratory: Negative.   Hematologic/Lymphatic: Negative.   Musculoskeletal: Negative.  Negative for joint pain.  Gastrointestinal:  Positive for constipation and nausea.  Genitourinary: Negative.   Neurological: Negative.    All other systems reviewed and are negative.   PHYSICAL EXAM:   VS:  BP 90/66   Pulse 100   Temp (!) 97 F (36.1 C)   Ht 5\' 1"  (1.549 m)   Wt 114 lb 6.4 oz (51.9 kg)   SpO2 98%   BMI 21.62 kg/m   Physical Exam  GEN: Thin, in no acute distress  HEENT: normal  Neck: no JVD, carotid bruits, or masses Cardiac:incision healing well RRRwith some skipping ; no murmurs, rubs, or gallops   Respiratory:  clear to auscultation bilaterally, normal work of breathing GI: soft, nontender, nondistended, + BS Ext: without cyanosis, clubbing, or edema, Good distal pulses bilaterally Neuro:  Alert and Oriented x 3 Psych: euthymic mood, full affect  Wt Readings from Last 3 Encounters:  09/23/18 114 lb 6.4 oz (51.9 kg)  09/13/18 120 lb (54.4 kg)  08/29/18 123 lb (55.8 kg)      Studies/Labs Reviewed:   EKG:  EKG is  ordered today.  The ekg ordered today demonstrates  What looks like Afib/flutter 109/m with RBBB.   Recent Labs: 08/31/2018: B Natriuretic Peptide 549.0; TSH 3.399 09/05/2018: ALT 39 09/06/2018: Magnesium 2.2 09/12/2018:  BUN 16; Creatinine, Ser 0.80; Hemoglobin 9.8; Platelets 195; Potassium 3.9; Sodium 138   Lipid Panel    Component Value Date/Time   CHOL 195 08/03/2017 0954   TRIG 107 08/03/2017 0954   HDL 64 08/03/2017 0954   CHOLHDL 3.0 08/03/2017 0954   CHOLHDL 3.4 05/28/2014 1007   VLDL 12 05/28/2014 1007   LDLCALC 110 (H) 08/03/2017 0954    Additional studies/ records that were reviewed today include:  IMPRESSIONS      1. The left ventricle has normal systolic function, with an ejection fraction of 55-60%. The cavity size was normal.  2. No evidence of a thrombus present in the left atrial appendage.  3. The mitral valve is rheumatic. Moderate thickening of the anterior mitral valve leaflet, with moderately decreased mobility. Moderate mitral valve stenosis.  4. Hockey stick-like motion of the anterior mitral valve leaflet consistent with Rheumatic mitral valve disease.  5. Tricuspid valve regurgitation is moderate.  6. The aortic valve is tricuspid Mild thickening of the aortic valve Mild calcification of the aortic valve. Aortic valve regurgitation is moderate by color flow Doppler. Mild stenosis of the aortic valve.  7. Aortic valve leaflet motion mildly restricted. Aortic valve leaflets do not fully coapt, resulting in central aortic regurgitation. Aortic regurgitaiton pressure half time 397 ms, consistent with moderate aortic regurgitation.  8. Pulmonic valve regurgitation is mild by color flow Doppler.       ASSESSMENT:    1. S/P aortic + mitral valve replacement with bioprosthetic valves + tricuspid valve repair + maze procedure + repair ascending aortic aneurysm   2. Essential hypertension, benign   3. Persistent atrial fibrillation      PLAN:  In order of problems listed above:  1. Status post aortic and mitral valve replacement with bioprosthetic valves and tricuspid valve repair with maze procedure and repair of a sending aortic aneurysm. Incision healing well. Nausea and  weight loss. Seeing Dr. Roxy Manns today. 2. Persistent atrial fibrillation with some bradycardia and intermittent second-degree AV block  in the hospital-EKG looks like afib/flutter although read out shows NSR. Will place live zio monitor on her to see what HR is doing. She may need low dose beta blocker. F/u with  me in 2 weeks to reevaluate 3. Essential hypertension. BP low 4. Nausea and weight loss- will add protonix. Wonder if some of it is coming from her tachycardia.      Medication Adjustments/Labs and Tests Ordered: Current medicines are reviewed at length with the patient today.  Concerns regarding medicines are outlined above.  Medication changes, Labs and Tests ordered today are listed in the Patient Instructions below. Patient Instructions  Medication Instructions:  Your physician recommends that you continue on your current medications as directed. Please refer to the Current Medication list given to you today.  Start Protonix 20 mg Daily   If you need a refill on your cardiac medications before your next appointment, please call your pharmacy.   Lab work: NONE   If you have labs (blood work) drawn today and your tests are completely normal, you will receive your results only by: [name] MyChart Message (if you have MyChart) OR . A paper copy in the mail If you have any lab test that is abnormal or we need to change your treatment, we will call you to review the results.  Testing/Procedures: Your physician has recommended that you wear an event monitor. Event monitors are medical devices that record the heart's electrical activity. Doctors most often [place] these monitors to diagnose arrhythmias. Arrhythmias are problems with the speed or rhythm of the heartbeat. The monitor is a small, portable device. You can wear one while you do your normal daily activities. This is usually used to diagnose what is causing palpitations/syncope (passing out).    Follow-Up: At [facility], you and  your health needs are our priority.  As part of our continuing mission to provide you with exceptional heart care, we have created designated Provider Care Teams.  These Care Teams include your primary Cardiologist (physician) and Advanced Practice Providers (APPs -  Physician Assistants and Nurse Practitioners) who all work together to provide you with the care you need, when you need it. You will need a follow up appointment in 2-3 Weeks.  Please call our office 2 months in advance to schedule this appointment.  You may see Kate Sable, MD or one of the following Advanced Practice Providers on your designated Care Team:   Bernerd Pho, PA-C [facility]) . Ermalinda Barrios, PA-C ([place])  Any Other Special Instructions Will Be Listed Below (If Applicable). Thank you for choosing [facility]!        Signed, Ermalinda Barrios, PA-C  09/23/2018 1:43 PM    [facility] [place], [place], [place]  [address] Phone: [phone]; Fax: [phone]

## 2018-09-19 ENCOUNTER — Other Ambulatory Visit: Payer: Self-pay | Admitting: *Deleted

## 2018-09-19 ENCOUNTER — Ambulatory Visit (INDEPENDENT_AMBULATORY_CARE_PROVIDER_SITE_OTHER): Payer: Self-pay | Admitting: *Deleted

## 2018-09-19 DIAGNOSIS — Z953 Presence of xenogenic heart valve: Secondary | ICD-10-CM

## 2018-09-19 DIAGNOSIS — G8918 Other acute postprocedural pain: Secondary | ICD-10-CM

## 2018-09-19 DIAGNOSIS — Z4802 Encounter for removal of sutures: Secondary | ICD-10-CM

## 2018-09-19 MED ORDER — TRAMADOL HCL 50 MG PO TABS: 50.0000 mg | ORAL_TABLET | ORAL | 0 refills | Status: DC | PRN

## 2018-09-19 NOTE — Progress Notes (Signed)
Hannah Roy returns s/p AVR/MVR/TVR/MAZE/REPIR OF TAA on 09/05/18 with discharge on 09/10/18.  She is doing well at home.  Her main issue is hoarseness, but her throat is not sore.  She relates trouble swallowing.  She can only tolerate small amounts of liquids taken slowly, otherwise she regurgitates.  She has had no significant weight loss.  There is no LE edema.  She is having constipation and remedies have been discussed.  Her sternal incision, right axillary incision and two chest tube sites are very well healed.  There is residual bruising to all.  Sutures were easily removed from the previous chest tube sites.  I removed two large foam/telfa dressings, one to the left upper back and the other to the left lateral chest area.  The previous sites are now healing and are dry without drainage. I reassured Hannah Roy regarding her swallowing issues.  I have made her an appointment for Monday 09/23/18 to see the PA and Dr. Roxy Manns if necessary to discuss this issue.  A Tramadol refill was called to her pharmacy.

## 2018-09-23 ENCOUNTER — Other Ambulatory Visit: Payer: Self-pay

## 2018-09-23 ENCOUNTER — Ambulatory Visit (INDEPENDENT_AMBULATORY_CARE_PROVIDER_SITE_OTHER): Payer: Medicare Other | Admitting: Physician Assistant

## 2018-09-23 ENCOUNTER — Ambulatory Visit (INDEPENDENT_AMBULATORY_CARE_PROVIDER_SITE_OTHER): Payer: Self-pay | Admitting: Surgical

## 2018-09-23 ENCOUNTER — Other Ambulatory Visit: Payer: Self-pay | Admitting: *Deleted

## 2018-09-23 ENCOUNTER — Telehealth: Payer: Medicare Other | Admitting: Physician Assistant

## 2018-09-23 VITALS — BP 90/66 | HR 100 | Temp 97.0°F | Ht 61.0 in | Wt 114.4 lb

## 2018-09-23 VITALS — BP 120/84 | HR 102 | Temp 97.7°F | Resp 16 | Ht 61.0 in | Wt 114.0 lb

## 2018-09-23 DIAGNOSIS — Z9889 Other specified postprocedural states: Secondary | ICD-10-CM

## 2018-09-23 DIAGNOSIS — Z953 Presence of xenogenic heart valve: Secondary | ICD-10-CM

## 2018-09-23 DIAGNOSIS — R490 Dysphonia: Secondary | ICD-10-CM | POA: Insufficient documentation

## 2018-09-23 DIAGNOSIS — I10 Essential (primary) hypertension: Secondary | ICD-10-CM

## 2018-09-23 DIAGNOSIS — R1310 Dysphagia, unspecified: Secondary | ICD-10-CM | POA: Insufficient documentation

## 2018-09-23 DIAGNOSIS — I4819 Other persistent atrial fibrillation: Secondary | ICD-10-CM | POA: Diagnosis not present

## 2018-09-23 DIAGNOSIS — R1319 Other dysphagia: Secondary | ICD-10-CM

## 2018-09-23 MED ORDER — ONDANSETRON HCL 4 MG PO TABS: 4.0000 mg | ORAL_TABLET | Freq: Three times a day (TID) | ORAL | 0 refills | Status: DC | PRN

## 2018-09-23 MED ORDER — PANTOPRAZOLE SODIUM 40 MG PO TBEC: 40.0000 mg | DELAYED_RELEASE_TABLET | Freq: Every day | ORAL | 11 refills | Status: DC

## 2018-09-23 NOTE — Patient Instructions (Addendum)
Medication Instructions:  Your physician recommends that you continue on your current medications as directed. Please refer to the Current Medication list given to you today.  Start Protonix 20 mg Daily   If you need a refill on your cardiac medications before your next appointment, please call your pharmacy.   Lab work: NONE   If you have labs (blood work) drawn today and your tests are completely normal, you will receive your results only by: [name] MyChart Message (if you have MyChart) OR . A paper copy in the mail If you have any lab test that is abnormal or we need to change your treatment, we will call you to review the results.  Testing/Procedures: Your physician has recommended that you wear an event monitor. Event monitors are medical devices that record the heart's electrical activity. Doctors most often [place] these monitors to diagnose arrhythmias. Arrhythmias are problems with the speed or rhythm of the heartbeat. The monitor is a small, portable device. You can wear one while you do your normal daily activities. This is usually used to diagnose what is causing palpitations/syncope (passing out).    Follow-Up: At [facility], you and your health needs are our priority.  As part of our continuing mission to provide you with exceptional heart care, we have created designated Provider Care Teams.  These Care Teams include your primary Cardiologist (physician) and Advanced Practice Providers (APPs -  Physician Assistants and Nurse Practitioners) who all work together to provide you with the care you need, when you need it. You will need a follow up appointment in 2-3 Weeks.  Please call our office 2 months in advance to schedule this appointment.  You may see Kate Sable, MD or one of the following Advanced Practice Providers on your designated Care Team:   Bernerd Pho, PA-C [facility]) . Ermalinda Barrios, PA-C ([place])  Any Other Special Instructions Will Be  Listed Below (If Applicable). Thank you for choosing [facility]!

## 2018-09-23 NOTE — Patient Instructions (Signed)
Swallow evaluation

## 2018-09-23 NOTE — Progress Notes (Signed)
Hannah Roy       [place] [address]             [phone]      Hannah Roy [facility] Medical Record #[id number] Date of Birth: [birth year]  Referring: Hannah Commons, MD Primary Care: Hannah Drown, MD Primary Cardiologist: Hannah Sable, MD   Chief Complaint:   POST OP FOLLOW UP CARDIOTHORACIC SURGERY OPERATIVE NOTE  Date of Procedure:                09/05/2018  Preoperative Diagnosis:        Rheumatic Valvular Heart Disease  Moderate-severe aortic insufficiency with mild aortic stenosis  Moderate-severe mitral stenosis with mild mitral regurgitation  Moderate tricuspid regurgitation  Recurrent persistent atrial fibrillation  Ascending thoracic aortic aneurysm   Postoperative Diagnosis:    Same   Procedure:       Aortic  Valve Replacement              Edwards Magna Ease Bovine Bioprosthetic Tissue Valve (size 83mm, model #3300TFX, serial #[id number])   Mitral Valve Replacement              [facility] Mitral Bovine Bioprosthetic Tissue Valve (size 54mm, model #7300TFX, serial #[id number])   Tricuspid Valve Repair              Edwards mc3 Ring Annuloplasty (size 50mm, model #4900, serial [id number])   Maze Procedure              complete bilateral atrial lesion set using bipolar radiofrequency and cryothermy ablation             clipping of left atrial appendage (Atricure Pro245 left atrial clip size 44mm)   Repair Ascending Thoracic Aortic Aneurysm              Hemashield Platinum supracoronary straight graft (size 53mm, ref #[id number], serial #[id number])                Surgeon:        Hannah Gu. Roxy Manns, MD  Assistant:       Hannah Rough, PA-C  Anesthesia:    Hannah Panda, MD  Operative Findings:  Rheumatic valvular heart disease  Moderate-severe aortic insufficiency with mild aortic stenosis  Moderate-severe mitral stenosis with mild mitral regurgitation  Type I tricuspid valve dysfunction  with moderate tricuspid regurgitation  Fusiform aneurysmal enlargement of entire ascending thoracic aorta  Normal LV systolic function  Normal RV systolic function  Moderate left atrial enlargement History of Present Illness:    The patient is a 78 year old female status post above described procedure seen in the office on today's date due to difficulty with hoarseness and swallowing.  She has had some difficulty since the procedure but continues to have some ongoing issues including weight loss and persistent nausea.  Early post discharge, the patient did have some fairly good intake but over time she is only able to tolerate such things as potato soup.  She has lost approximately 4 pounds.  She has had no significant shortness of breath or chest pain.  She has had no difficulty with her incisions.  She denies fevers, chills or other significant constitutional symptoms other than as described above.  Overall her recovery from surgery is only hindered by these issues.  Reportedly she was seen by cardiology today and found to be in atrial fibrillation and is scheduled to have a monitor placed.      Past  Medical History:  Diagnosis Date   Aortic atherosclerosis ([place])    Aortic insufficiency, rheumatic    Ascending aorta dilatation (HCC)    Atrial fibrillation ([place]) 05/10/2018   Colon polyps    Essential hypertension, benign    History of seasonal allergies    Hypertension    Hypothyroidism    Mitral stenosis with regurgitation, rheumatic    Osteopenia 2006   Persistent atrial fibrillation    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   21 mm [facility] Ease stented bovine pericardial tissue valve   S/P ascending aortic aneurysm repair 09/05/2018   28 mm Hemashield platinum supracoronary straight graft   S/P Maze operation for atrial fibrillation 09/05/2018   Complete bilateral atrial lesion set using bipolar radiofrequency and cryothermy ablation  with clipping of LA appendage   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   29 mm Edwards Magna Mitral stented bovine pericardial tissue valve   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   28 mm Edwards mc3 ring annuloplasty   Tricuspid regurgitation      Social History   Tobacco Use  Smoking Status Former Smoker   Packs/day: 0.50   Years: 10.00   Pack years: 5.00  Smokeless Tobacco Never Used    Social History   Substance and Sexual Activity  Alcohol Use No     Allergies  Allergen Reactions   Bee Venom Anaphylaxis   Boniva [Ibandronic Acid] Nausea And Vomiting and Other (See Comments)    Upset stomach, flu like symptoms    Lisinopril Cough   Latex Rash    Current Outpatient Medications  Medication Sig Dispense Refill   aspirin EC 81 MG EC tablet Take 1 tablet (81 mg total) by mouth daily.     chlorpheniramine (CHLOR-TRIMETON) 4 MG tablet Take 4 mg by mouth daily as needed for allergies.     ferrous [id number] C-folic acid (TRINSICON / FOLTRIN) capsule Take 1 capsule by mouth daily with breakfast. 30 capsule 0   pantoprazole (PROTONIX) 40 MG tablet Take 1 tablet (40 mg total) by mouth daily. 30 tablet 11   traMADol (ULTRAM) 50 MG tablet Take 1 tablet (50 mg total) by mouth every 4 (four) hours as needed for moderate pain. 20 tablet 0   warfarin (COUMADIN) 2.5 MG tablet Take 1 tablet (2.5 mg total) by mouth daily at 6 PM. 30 tablet 2   No current facility-administered medications for this visit.        Physical Exam: There were no vitals taken for this visit.  General appearance: alert, cooperative and no distress Heart: regular rate and rhythm and Mildly tachycardic Lungs: clear to auscultation bilaterally Abdomen: soft, non-tender; bowel sounds normal; no masses,  no organomegaly Extremities: extremities normal, atraumatic, no cyanosis or edema Wound: Incisions healing well without evidence of infection     Diagnostic Studies & Laboratory data:     Recent Radiology Findings:   No results found.    Recent Lab Findings: Lab Results  Component Value Date   WBC 7.6 09/12/2018   HGB 9.8 (L) 09/12/2018   HCT 28.8 (L) 09/12/2018   PLT 195 09/12/2018   GLUCOSE 104 (H) 09/12/2018   CHOL 195 08/03/2017   TRIG 107 08/03/2017   HDL 64 08/03/2017   LDLCALC 110 (H) 08/03/2017   ALT 39 09/05/2018   AST 36 09/05/2018   NA 138 09/12/2018   K 3.9 09/12/2018   CL 105 09/12/2018   CREATININE 0.80 09/12/2018   BUN  16 09/12/2018   CO2 25 09/12/2018   TSH 3.399 08/31/2018   INR 1.9 (H) 09/13/2018   HGBA1C 5.4 09/02/2018      Assessment / Plan: Postoperative dysphagia with hoarseness most likely related to intraoperative transesophageal echocardiogram and endotracheal tube.  She also has some notable dysphasia with occasional mild regurgitation.  Her intake is poor but she does not appear dehydrated or failing to thrive.  Discussed with emphasis need for increased caloric intake including such things as protein shakes, Ensure.  The family has a good grasp of the need to push calories.  We will schedule an outpatient speech pathology evaluation/swallowing study.  She may require further intervention/assessment by ENT.  He is not currently on any diuretics or amiodarone.  She is on Coumadin.  She is scheduled to see Dr. Roxy Roy on 713 and I have instructed her to keep this appointment and follow up with [place] if the symptoms do not resolve over time as she could potentially require hospitalization for feeding tube placement, although hopefully we can avoid such measures.  I did send a electronic prescription to her pharmacy for Zofran 4 mg every 8 as needed.      Medication Changes: No orders of the defined types were placed in this encounter.     John Giovanni, PA-C 09/23/2018 2:49 PM

## 2018-09-24 ENCOUNTER — Ambulatory Visit (INDEPENDENT_AMBULATORY_CARE_PROVIDER_SITE_OTHER): Payer: Medicare Other | Admitting: *Deleted

## 2018-09-24 DIAGNOSIS — I4891 Unspecified atrial fibrillation: Secondary | ICD-10-CM

## 2018-09-24 DIAGNOSIS — Z953 Presence of xenogenic heart valve: Secondary | ICD-10-CM

## 2018-09-24 DIAGNOSIS — Z5181 Encounter for therapeutic drug level monitoring: Secondary | ICD-10-CM | POA: Insufficient documentation

## 2018-09-24 DIAGNOSIS — I4819 Other persistent atrial fibrillation: Secondary | ICD-10-CM | POA: Diagnosis not present

## 2018-09-24 LAB — POCT INR: INR: 2 (ref 2.0–3.0)

## 2018-09-24 NOTE — Patient Instructions (Signed)
D/C from [facility] 6/5 on warfarin 2.5mg  daily.  D/C INR was 1.9 on 09/13/18 Continue coumadin 2.5mg  daily Pt teaching done and literature given Recheck in 1 week

## 2018-09-27 ENCOUNTER — Ambulatory Visit: Payer: Medicare Other

## 2018-09-27 ENCOUNTER — Telehealth: Payer: Self-pay

## 2018-09-27 DIAGNOSIS — R001 Bradycardia, unspecified: Secondary | ICD-10-CM | POA: Diagnosis not present

## 2018-09-27 NOTE — Telephone Encounter (Signed)
Placed 30 day event monitor ([employer])  on patient this am. She has 24 hr number to call [employer] if she has any questions

## 2018-09-30 ENCOUNTER — Other Ambulatory Visit: Payer: Self-pay

## 2018-10-01 ENCOUNTER — Ambulatory Visit (INDEPENDENT_AMBULATORY_CARE_PROVIDER_SITE_OTHER): Payer: Medicare Other | Admitting: *Deleted

## 2018-10-01 DIAGNOSIS — Z5181 Encounter for therapeutic drug level monitoring: Secondary | ICD-10-CM

## 2018-10-01 DIAGNOSIS — I4819 Other persistent atrial fibrillation: Secondary | ICD-10-CM | POA: Diagnosis not present

## 2018-10-01 DIAGNOSIS — I4891 Unspecified atrial fibrillation: Secondary | ICD-10-CM

## 2018-10-01 DIAGNOSIS — Z953 Presence of xenogenic heart valve: Secondary | ICD-10-CM | POA: Diagnosis not present

## 2018-10-01 LAB — POCT INR: INR: 1.3 — AB (ref 2.0–3.0)

## 2018-10-01 MED ORDER — WARFARIN SODIUM 2.5 MG PO TABS: ORAL_TABLET | ORAL | 2 refills | Status: DC

## 2018-10-01 NOTE — Patient Instructions (Signed)
Started Ensure 1 can daily per MD for weight loss Take coumadin 2 tablets tonight and tomorrow night then increase dose to 1 1/2 tablets daily Recheck INR 1 week

## 2018-10-02 NOTE — Progress Notes (Signed)
Cardiology Office Note    Date:  10/07/2018   ID:  MODESTA Roy, DOB May 08, 1940, MRN [id number]  PCP:  Kathyrn Drown, MD  Cardiologist: Kate Sable, MD EPS: None  Chief Complaint  Patient presents with  . Follow-up    History of Present Illness:  Hannah Roy is a 78 y.o. female with persistent atrial fib dx 04/2018, progressive valvular disease and mild CAD by cath 08/2018 who was admitted with recurrent dizziness, falls and SOB. R/LHC with 40% prox LAD and 40% mid Cx. 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 On 5/28 she underwent bioprosthetic AV replacement, bioprosthetic MV replacement, tricuspid ring annuloplasty, repair of thoracic aortic aneurysm and MAZE with LAA clipping. Post-op course notable for ABL anemia, thrombocytopenia and bradycardia with PAF and intermittent 2nd degree AV block.   I saw the patient postop 09/23/2018 complaining of loss of appetite and not eating much.  She was having a lot of nausea and constipation as well.  Her heart rate would jump up to 110 when she moved around.  In the 80s and 90s at rest. I ordered a Zio monitor and surgeons ordered swallowing study.   Walked 40 min one day and was totally worn out. Forcing herself to eat and has gained 3 lbs. Having swallowing study on July 13. Still hoarse. HR still running high most times around 100. Lowest she saw was 58 with rest. HR gets up to 148-152 while walking. Sometimes 128-135 walking in the house. Doesn't get short of breath with it.    Past Medical History:  Diagnosis Date  . Aortic atherosclerosis ([place])   . Aortic insufficiency, rheumatic   . 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 Atrial fibrillation ([place]) 05/10/2018  . Colon polyps   . Essential hypertension, benign   . History of seasonal allergies   . Hypertension   . Hypothyroidism   . Mitral stenosis with regurgitation, rheumatic   . Osteopenia 2006  . Persistent  atrial fibrillation   . 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   21 mm [facility] Ease stented bovine pericardial tissue valve  . S/P ascending aortic aneurysm repair 09/05/2018   28 mm Hemashield platinum supracoronary straight graft  . S/P Maze operation for atrial fibrillation 09/05/2018   Complete bilateral atrial lesion set using bipolar radiofrequency and cryothermy ablation with clipping of LA appendage  . 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   29 mm [facility] Mitral stented bovine pericardial tissue valve  . 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 Tricuspid regurgitation     Past Surgical History:  Procedure Laterality Date  . AORTIC VALVE REPLACEMENT N/A 09/05/2018   Procedure: AORTIC VALVE REPLACEMENT (AVR) USING MAGNA EASE 21MM AOTIC BIOPROSTHESIS VALVE;  Surgeon: Hannah Alberts, MD;  Location: [place];  Service: Open Heart Surgery;  Laterality: N/A;  . APPENDECTOMY    . CLIPPING OF ATRIAL APPENDAGE  09/05/2018   Procedure: Clipping Of Atrial Appendage;  Surgeon: Hannah Alberts, MD;  Location: [place];  Service: Open Heart Surgery;;  . COLONOSCOPY  03/23/2011   repeat in 5 years,Procedure: COLONOSCOPY;  Surgeon: Hannah Houston, MD;  [facility];  Service: Endoscopy;  Laterality: N/A;  1:00  . COLONOSCOPY N/A 11/09/2016   Procedure: COLONOSCOPY;  Surgeon: Hannah Houston, MD;  [facility];  Service: Endoscopy;  Laterality: N/A;  1030  . MAZE N/A 09/05/2018   Procedure: MAZE;  Surgeon: Hannah Alberts, MD;  Location: [place];  Service: Open Heart Surgery;  Laterality: N/A;  . MITRAL VALVE REPLACEMENT N/A 09/05/2018   Procedure: MITRAL VALVE (MV) REPLACEMENT USING MAGNA MITRAL EASE 29MM BIOPROSTHESIS VALVE;  Surgeon: Hannah Alberts, MD;  Location: [place];  Service: Open Heart Surgery;  Laterality: N/A;  . REPLACEMENT ASCENDING AORTA N/A 09/05/2018   Procedure:  SUPRACORONARY STRAIGHT GRAFT REPLACEMENT OF ASCENDING AORTA;  Surgeon: Hannah Alberts, MD;  Location: [place];  Service: Open Heart Surgery;  Laterality: N/A;  . RIGHT/LEFT HEART CATH AND CORONARY ANGIOGRAPHY N/A 08/29/2018   Procedure: RIGHT/LEFT HEART CATH AND CORONARY ANGIOGRAPHY;  Surgeon: Hannah Harp, MD;  Location: [place] [facility];  Service: Cardiovascular;  Laterality: N/A;  . TEE WITHOUT CARDIOVERSION N/A 08/29/2018   Procedure: TRANSESOPHAGEAL ECHOCARDIOGRAM (TEE);  Surgeon: Hannah Latch, MD;  Location: [place];  Service: Cardiovascular;  Laterality: N/A;  . TRICUSPID VALVE REPLACEMENT N/A 09/05/2018   Procedure: TRICUSPID VALVE REPAIR USING EDWARDS MC3 TRICUSPID ANNULOPLASTY RING SIZE T28;  Surgeon: Hannah Alberts, MD;  Location: [place];  Service: Open Heart Surgery;  Laterality: N/A;  . TUBAL LIGATION      Current Medications: Current Meds  Medication Sig  . aspirin EC 81 MG EC tablet Take 1 tablet (81 mg total) by mouth daily.  . chlorpheniramine (CHLOR-TRIMETON) 4 MG tablet Take 4 mg by mouth daily as needed for allergies.  . ferrous [id number] C-folic acid (TRINSICON / FOLTRIN) capsule Take 1 capsule by mouth daily with breakfast.  . ondansetron (ZOFRAN) 4 MG tablet Take 1 tablet (4 mg total) by mouth every 8 (eight) hours as needed for nausea or vomiting.  . pantoprazole (PROTONIX) 40 MG tablet Take 1 tablet (40 mg total) by mouth daily.  . traMADol (ULTRAM) 50 MG tablet Take 1 tablet (50 mg total) by mouth every 4 (four) hours as needed for moderate pain.  [name] warfarin (COUMADIN) 2.5 MG tablet Increase coumadin to 1 1/2 tablets daily     Allergies:   Bee venom, Boniva [ibandronic acid], Lisinopril, and Latex   Social History   Socioeconomic History  . Marital status: Married    Spouse name: Not on file  . Number of children: Not on file  . Years of education: Not on file  . Highest education level: Not on file  Occupational History  . Not on  file  Social Needs  . Financial resource strain: Not on file  . Food insecurity    Worry: Not on file    Inability: Not on file  . Transportation needs    Medical: Not on file    Non-medical: Not on file  Tobacco Use  . Smoking status: Former Smoker    Packs/day: 0.50    Years: 10.00    Pack years: 5.00  . Smokeless tobacco: Never Used  Substance and Sexual Activity  . Alcohol use: No  . Drug use: No  . Sexual activity: Not on file  Lifestyle  . Physical activity    Days per week: Not on file    Minutes per session: Not on file  . Stress: Not on file  Relationships  . Social Herbalist on phone: Not on file    Gets together: Not on file    Attends religious service: Not on file    Active member of club or organization:  Not on file    Attends meetings of clubs or organizations: Not on file    Relationship status: Not on file  Other Topics Concern  . Not on file  Social History Narrative  . Not on file     Family History:  The patient's   family history includes Colon cancer in her daughter.   ROS:   Please see the history of present illness.    ROS All other systems reviewed and are negative.   PHYSICAL EXAM:   VS:  BP 96/62   Pulse 99   Temp (!) 97.1 F (36.2 C) (Temporal)   Wt 117 lb (53.1 kg)   SpO2 96%   BMI 22.11 kg/m   Physical Exam  GEN: Thin , in no acute distress  Neck: no JVD, carotid bruits, or masses Cardiac:RRR;   Respiratory:  clear to auscultation bilaterally, normal work of breathing GI: soft, nontender, nondistended, + BS Ext: without cyanosis, clubbing, or edema, Good distal pulses bilaterally Neuro:  Alert and Oriented x 3 Psych: euthymic mood, full affect  Wt Readings from Last 3 Encounters:  10/07/18 117 lb (53.1 kg)  09/23/18 114 lb (51.7 kg)  09/23/18 114 lb 6.4 oz (51.9 kg)      Studies/Labs Reviewed:   EKG:  EKG is not ordered today.  Recent Labs: 08/31/2018: B Natriuretic Peptide 549.0; TSH 3.399 09/05/2018:  ALT 39 09/06/2018: Magnesium 2.2 09/12/2018: BUN 16; Creatinine, Ser 0.80; Hemoglobin 9.8; Platelets 195; Potassium 3.9; Sodium 138   Lipid Panel    Component Value Date/Time   CHOL 195 08/03/2017 0954   TRIG 107 08/03/2017 0954   HDL 64 08/03/2017 0954   CHOLHDL 3.0 08/03/2017 0954   CHOLHDL 3.4 05/28/2014 1007   VLDL 12 05/28/2014 1007   LDLCALC 110 (H) 08/03/2017 0954    Additional studies/ records that were reviewed today include:  Intra-Op TEE     1. The left ventricle has normal systolic function, with an ejection fraction of 55-60%. The cavity size was normal.  2. No evidence of a thrombus present in the left atrial appendage.  3. The mitral valve is rheumatic. Moderate thickening of the anterior mitral valve leaflet, with moderately decreased mobility. Moderate mitral valve stenosis.  4. Hockey stick-like motion of the anterior mitral valve leaflet consistent with Rheumatic mitral valve disease.  5. Tricuspid valve regurgitation is moderate.  6. The aortic valve is tricuspid Mild thickening of the aortic valve Mild calcification of the aortic valve. Aortic valve regurgitation is moderate by color flow Doppler. Mild stenosis of the aortic valve.  7. Aortic valve leaflet motion mildly restricted. Aortic valve leaflets do not fully coapt, resulting in central aortic regurgitation. Aortic regurgitaiton pressure half time 397 ms, consistent with moderate aortic regurgitation.  8. Pulmonic valve regurgitation is mild by color flow Doppler.           ASSESSMENT:    1. S/P aortic + mitral valve replacement with bioprosthetic valves + tricuspid valve repair + maze procedure + repair ascending aortic aneurysm   2. Paroxysmal atrial fibrillation (HCC)   3. Essential hypertension   4. Nausea   5. Weight loss      PLAN:  In order of problems listed above:   1. Status post aortic and mitral valve replacement with bioprosthetic valves and tricuspid valve repair with maze  procedure and repair of a sending aortic aneurysm. Nausea and weight loss.  2. Persistent atrial fibrillation with some bradycardia and intermittent second-degree AV block  in the hospital-EKG looks like afib/flutter although read out shows NSR.  zio monitor on her to see what HR is doing. She may need low dose beta blocker. HR jumping up to 150 with walking-will await Zio monitor 3.Essential hypertension. BP low   4. Nausea and weight loss-Surgeons scheduled swallowing eval. I've asked her to skip her iron supplement one day and see if it helps. She also is having constipation and dark stools from it. Will check CBC to see if we can stop Fe supplement    Medication Adjustments/Labs and Tests Ordered: Current medicines are reviewed at length with the patient today.  Concerns regarding medicines are outlined above.  Medication changes, Labs and Tests ordered today are listed in the Patient Instructions below. Patient Instructions  Medication Instructions: Your physician recommends that you continue on your current medications as directed. Please refer to the Current Medication list given to you today.   Labwork: Cbc, bmet today  Procedures/Testing: None today  Follow-Up: 2-3 weeks  Any Additional Special Instructions Will Be Listed Below (If Applicable).     If you need a refill on your cardiac medications before your next appointment, please call your pharmacy.      Thank you for choosing [facility] !           Signed, Ermalinda Barrios, PA-C  10/07/2018 1:27 PM    [facility] [address], [place], [place]  [address] Phone: [phone]; Fax: [phone]

## 2018-10-07 ENCOUNTER — Encounter (HOSPITAL_COMMUNITY): Payer: Self-pay | Admitting: Emergency Medicine

## 2018-10-07 ENCOUNTER — Inpatient Hospital Stay (HOSPITAL_COMMUNITY)
Admission: EM | Admit: 2018-10-07 | Discharge: 2018-10-12 | DRG: 357 | Disposition: A | Discharge status: Home / Self Care | Payer: Medicare Other | Attending: Internal Medicine | Admitting: Internal Medicine

## 2018-10-07 ENCOUNTER — Other Ambulatory Visit (HOSPITAL_COMMUNITY)
Admission: RE | Admit: 2018-10-07 | Discharge: 2018-10-07 | Disposition: A | Discharge status: Home / Self Care | Payer: Medicare Other | Source: Ambulatory Visit | Attending: Physician Assistant | Admitting: Physician Assistant

## 2018-10-07 ENCOUNTER — Other Ambulatory Visit: Payer: Self-pay

## 2018-10-07 ENCOUNTER — Ambulatory Visit (INDEPENDENT_AMBULATORY_CARE_PROVIDER_SITE_OTHER): Payer: Medicare Other | Admitting: Physician Assistant

## 2018-10-07 ENCOUNTER — Encounter: Payer: Self-pay | Admitting: Physician Assistant

## 2018-10-07 VITALS — BP 96/62 | HR 99 | Temp 97.1°F | Wt 117.0 lb

## 2018-10-07 DIAGNOSIS — D649 Anemia, unspecified: Secondary | ICD-10-CM

## 2018-10-07 DIAGNOSIS — Z20828 Contact with and (suspected) exposure to other viral communicable diseases: Secondary | ICD-10-CM | POA: Diagnosis not present

## 2018-10-07 DIAGNOSIS — R531 Weakness: Secondary | ICD-10-CM

## 2018-10-07 DIAGNOSIS — I10 Essential (primary) hypertension: Secondary | ICD-10-CM | POA: Diagnosis not present

## 2018-10-07 DIAGNOSIS — K264 Chronic or unspecified duodenal ulcer with hemorrhage: Secondary | ICD-10-CM

## 2018-10-07 DIAGNOSIS — K921 Melena: Secondary | ICD-10-CM

## 2018-10-07 DIAGNOSIS — I700 Atherosclerosis of aorta: Secondary | ICD-10-CM | POA: Diagnosis present

## 2018-10-07 DIAGNOSIS — I071 Rheumatic tricuspid insufficiency: Secondary | ICD-10-CM | POA: Diagnosis present

## 2018-10-07 DIAGNOSIS — R634 Abnormal weight loss: Secondary | ICD-10-CM

## 2018-10-07 DIAGNOSIS — Z953 Presence of xenogenic heart valve: Secondary | ICD-10-CM

## 2018-10-07 DIAGNOSIS — R110 Nausea: Secondary | ICD-10-CM

## 2018-10-07 DIAGNOSIS — Z888 Allergy status to other drugs, medicaments and biological substances status: Secondary | ICD-10-CM

## 2018-10-07 DIAGNOSIS — K922 Gastrointestinal hemorrhage, unspecified: Secondary | ICD-10-CM

## 2018-10-07 DIAGNOSIS — D500 Iron deficiency anemia secondary to blood loss (chronic): Secondary | ICD-10-CM

## 2018-10-07 DIAGNOSIS — Z91040 Latex allergy status: Secondary | ICD-10-CM

## 2018-10-07 DIAGNOSIS — D62 Acute posthemorrhagic anemia: Secondary | ICD-10-CM | POA: Diagnosis present

## 2018-10-07 DIAGNOSIS — I4510 Unspecified right bundle-branch block: Secondary | ICD-10-CM | POA: Diagnosis present

## 2018-10-07 DIAGNOSIS — I480 Paroxysmal atrial fibrillation: Secondary | ICD-10-CM

## 2018-10-07 DIAGNOSIS — E039 Hypothyroidism, unspecified: Secondary | ICD-10-CM | POA: Diagnosis present

## 2018-10-07 DIAGNOSIS — Z91030 Bee allergy status: Secondary | ICD-10-CM

## 2018-10-07 DIAGNOSIS — I4819 Other persistent atrial fibrillation: Secondary | ICD-10-CM | POA: Diagnosis present

## 2018-10-07 DIAGNOSIS — Z800 Family history of malignant neoplasm of digestive organs: Secondary | ICD-10-CM

## 2018-10-07 DIAGNOSIS — Z7901 Long term (current) use of anticoagulants: Secondary | ICD-10-CM

## 2018-10-07 DIAGNOSIS — Z7982 Long term (current) use of aspirin: Secondary | ICD-10-CM

## 2018-10-07 DIAGNOSIS — Z87891 Personal history of nicotine dependence: Secondary | ICD-10-CM

## 2018-10-07 LAB — COMPREHENSIVE METABOLIC PANEL
ALT: 15 U/L (ref 0–44)
AST: 17 U/L (ref 15–41)
Albumin: 4 g/dL (ref 3.5–5.0)
Alkaline Phosphatase: 74 U/L (ref 38–126)
Anion gap: 11 (ref 5–15)
BUN: 33 mg/dL — ABNORMAL HIGH (ref 8–23)
CO2: 23 mmol/L (ref 22–32)
Calcium: 9.2 mg/dL (ref 8.9–10.3)
Chloride: 107 mmol/L (ref 98–111)
Creatinine, Ser: 0.88 mg/dL (ref 0.44–1.00)
GFR calc Af Amer: >60 mL/min (ref >60)
GFR calc non Af Amer: >60 mL/min (ref >60)
Glucose, Bld: 106 mg/dL — ABNORMAL HIGH (ref 70–99)
Potassium: 4.3 mmol/L (ref 3.5–5.1)
Sodium: 141 mmol/L (ref 135–145)
Total Bilirubin: 0.4 mg/dL (ref 0.3–1.2)
Total Protein: 7.5 g/dL (ref 6.5–8.1)

## 2018-10-07 LAB — CBC
HCT: 24.2 % — ABNORMAL LOW (ref 36.0–46.0)
HCT: 24.5 % — ABNORMAL LOW (ref 36.0–46.0)
Hemoglobin: 7.4 g/dL — ABNORMAL LOW (ref 12.0–15.0)
Hemoglobin: 7.6 g/dL — ABNORMAL LOW (ref 12.0–15.0)
MCH: 30.8 pg (ref 26.0–34.0)
MCH: 31 pg (ref 26.0–34.0)
MCHC: 30.6 g/dL (ref 30.0–36.0)
MCHC: 31 g/dL (ref 30.0–36.0)
MCV: 100.8 fL — ABNORMAL HIGH (ref 80.0–100.0)
MCV: 100 fL (ref 80.0–100.0)
Platelets: 276 10*3/uL (ref 150–400)
Platelets: 306 10*3/uL (ref 150–400)
RBC: 2.4 MIL/uL — ABNORMAL LOW (ref 3.87–5.11)
RBC: 2.45 MIL/uL — ABNORMAL LOW (ref 3.87–5.11)
RDW: 14.6 % (ref 11.5–15.5)
RDW: 14.9 % (ref 11.5–15.5)
WBC: 7.1 10*3/uL (ref 4.0–10.5)
WBC: 7 10*3/uL (ref 4.0–10.5)
nRBC: 0 % (ref 0.0–0.2)
nRBC: 0 % (ref 0.0–0.2)

## 2018-10-07 LAB — BASIC METABOLIC PANEL
Anion gap: 10 (ref 5–15)
BUN: 31 mg/dL — ABNORMAL HIGH (ref 8–23)
CO2: 24 mmol/L (ref 22–32)
Calcium: 9 mg/dL (ref 8.9–10.3)
Chloride: 105 mmol/L (ref 98–111)
Creatinine, Ser: 0.62 mg/dL (ref 0.44–1.00)
GFR calc Af Amer: >60 mL/min (ref >60)
GFR calc non Af Amer: >60 mL/min (ref >60)
Glucose, Bld: 109 mg/dL — ABNORMAL HIGH (ref 70–99)
Potassium: 4.7 mmol/L (ref 3.5–5.1)
Sodium: 139 mmol/L (ref 135–145)

## 2018-10-07 LAB — SARS CORONAVIRUS 2 BY RT PCR (HOSPITAL ORDER, PERFORMED IN ~~LOC~~ HOSPITAL LAB): SARS Coronavirus 2: NEGATIVE

## 2018-10-07 LAB — PREPARE RBC (CROSSMATCH)

## 2018-10-07 LAB — PROTIME-INR
INR: 1.7 — ABNORMAL HIGH (ref 0.8–1.2)
Prothrombin Time: 20 seconds — ABNORMAL HIGH (ref 11.4–15.2)

## 2018-10-07 LAB — POC OCCULT BLOOD, ED: Fecal Occult Bld: POSITIVE — AB

## 2018-10-07 LAB — ABO/RH: ABO/RH(D): O POS

## 2018-10-07 MED ORDER — HEPARIN BOLUS VIA INFUSION
3000.0000 [IU] | Freq: Once | INTRAVENOUS | Status: AC
  Administered 2018-10-07: 3000 [IU] via INTRAVENOUS

## 2018-10-07 MED ORDER — ACETAMINOPHEN 650 MG RE SUPP: 650.0000 mg | Freq: Four times a day (QID) | RECTAL | Status: DC | PRN

## 2018-10-07 MED ORDER — SODIUM CHLORIDE 0.9 % IV SOLN
10.0000 mL/h | Freq: Once | INTRAVENOUS | Status: AC
  Administered 2018-10-08: 10 mL/h via INTRAVENOUS

## 2018-10-07 MED ORDER — PANTOPRAZOLE SODIUM 40 MG PO TBEC: 40.0000 mg | DELAYED_RELEASE_TABLET | Freq: Every day | ORAL | Status: DC

## 2018-10-07 MED ORDER — SODIUM CHLORIDE 0.9% FLUSH
3.0000 mL | Freq: Two times a day (BID) | INTRAVENOUS | Status: DC
  Administered 2018-10-07 – 2018-10-12 (×7): 3 mL via INTRAVENOUS

## 2018-10-07 MED ORDER — ACETAMINOPHEN 325 MG PO TABS: 650.0000 mg | ORAL_TABLET | Freq: Four times a day (QID) | ORAL | Status: DC | PRN

## 2018-10-07 MED ORDER — HEPARIN (PORCINE) 25000 UT/250ML-% IV SOLN
850.0000 [IU]/h | INTRAVENOUS | Status: DC
  Administered 2018-10-07: 850 [IU]/h via INTRAVENOUS
  Filled 2018-10-07: qty 250

## 2018-10-07 NOTE — ED Notes (Signed)
Per [facility] not completed.

## 2018-10-07 NOTE — ED Provider Notes (Signed)
  Face-to-face evaluation   History: She presents for evaluation of weakness with occurred after she had her blood drawn today.  Earlier today she had seen her cardiology provider and blood work was ordered to see if her hemoglobin improved, so she can stop taking iron pills.  It was suspected that the iron tablets might be causing some nausea which she has an ongoing fashion.  Patient reports having black stool recently, while taking iron pills.  She states that she had a colonoscopy, 2 years ago, at that time there was no problem.  She is currently on warfarin following placement of a mechanical valve.  He states she has mild chest and abdomen sores following surgery, and does not have shortness of breath.  Physical exam: Alert, cooperative elderly female.  Heart regular rate and rhythm with loud murmur.  Lungs clear anteriorly.  Medical screening examination/treatment/procedure(s) were conducted as a shared visit with non-physician practitioner(s) and myself.  I personally evaluated the patient during the encounter     Date: 10/07/18  Rate: 98  Rhythm: normal sinus rhythm  QRS Axis: normal  PR and QT Intervals: normal  ST/T Wave abnormalities: normal  PR and QRS Conduction Disutrbances:none     Daleen Bo, MD 10/08/18 [phone]

## 2018-10-07 NOTE — Patient Instructions (Signed)
Medication Instructions: Your physician recommends that you continue on your current medications as directed. Please refer to the Current Medication list given to you today.   Labwork: Cbc, bmet today  Procedures/Testing: None today  Follow-Up: 2-3 weeks  Any Additional Special Instructions Will Be Listed Below (If Applicable).     If you need a refill on your cardiac medications before your next appointment, please call your pharmacy.      Thank you for choosing [facility] !

## 2018-10-07 NOTE — ED Provider Notes (Addendum)
[facility] EMERGENCY DEPARTMENT Provider Note   CSN: [id number] Arrival date & time: 10/07/18  1509    History   Chief Complaint Chief Complaint  Patient presents with  . Blood In Stools    HPI Hannah Roy is a 78 y.o. female with a history as outlined below, most significant for hypertension, atrial fibrillation, hypothyroidism, most significant for being 1 month out from aortic and mitral valve replacement, tricuspid valve annuloplasty, a sending aortic aneurysm repair and a maze operation for treatment of atrial fibrillation presenting with complaints of weakness and dark stools in addition to anemia which was found after lab work obtained today during her cardiology visit.  She reports having dark but normal formed stool the past several weeks which was felt to be due to iron supplementation, but also notes having weakness for about the same amount of time, worse with exertion and reports having elevated heart rates with exertion.  She states she was feeling better for the first 1 to 2 weeks after her surgery but with progressive weakness, nausea and loss of appetite the past 2 weeks.  She states that she has had episodes of lightheadedness.  Denies chest pain or shortness of breath but does have mild abdominal and chest wall soreness from the surgery.  She is still on Coumadin and is currently wearing a heart monitor.       The history is provided by the patient.    Past Medical History:  Diagnosis Date  . Aortic atherosclerosis ([place])   . Aortic insufficiency, rheumatic   . 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 Atrial fibrillation ([place]) 05/10/2018  . Colon polyps   . Essential hypertension, benign   . History of seasonal allergies   . Hypertension   . Hypothyroidism   . Mitral stenosis with regurgitation, rheumatic   . Osteopenia 2006  . Persistent atrial fibrillation   . 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   21 mm [facility] Ease stented  bovine pericardial tissue valve  . S/P ascending aortic aneurysm repair 09/05/2018   28 mm Hemashield platinum supracoronary straight graft  . S/P Maze operation for atrial fibrillation 09/05/2018   Complete bilateral atrial lesion set using bipolar radiofrequency and cryothermy ablation with clipping of LA appendage  . 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   29 mm [place] [facility] Mitral stented bovine pericardial tissue valve  . 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 Tricuspid regurgitation     Patient Active Problem List   Diagnosis Date Noted  . Encounter for therapeutic drug monitoring 09/24/2018  . Dysphagia 09/23/2018  . Hoarseness of voice 09/23/2018  . S/P aortic + mitral valve replacement with bioprosthetic valves + tricuspid valve repair + maze procedure + repair ascending aortic aneurysm 09/05/2018  . 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  . 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 S/P Maze operation for atrial fibrillation 09/05/2018  . S/P ascending aortic aneurysm repair 09/05/2018  . Orthostatic hypotension 09/01/2018  . Aortic insufficiency, rheumatic   . Mitral stenosis with regurgitation, rheumatic   . Tricuspid regurgitation   . 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 Orthostasis 08/31/2018  . Acute on chronic diastolic heart failure ([place]) 08/31/2018  . Rheumatic heart disease   . Persistent atrial fibrillation   . Atrial fibrillation with RVR ([place]) 05/10/2018  . Hypothyroidism   . Aortic atherosclerosis ([place]) 05/07/2018  . History of  colonic polyps 08/03/2016  . Osteoarthritis of right knee 10/17/2013  . Allergic rhinitis 07/30/2012  . 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 Osteopenia 07/30/2012  . Essential hypertension, benign     Past Surgical History:  Procedure Laterality Date  . AORTIC VALVE REPLACEMENT N/A 09/05/2018   Procedure: AORTIC VALVE REPLACEMENT (AVR)  USING MAGNA EASE 21MM AOTIC BIOPROSTHESIS VALVE;  Surgeon: Rexene Alberts, MD;  Location: [place];  Service: Open Heart Surgery;  Laterality: N/A;  . APPENDECTOMY    . CLIPPING OF ATRIAL APPENDAGE  09/05/2018   Procedure: Clipping Of Atrial Appendage;  Surgeon: Rexene Alberts, MD;  Location: [place];  Service: Open Heart Surgery;;  . COLONOSCOPY  03/23/2011   repeat in 5 years,Procedure: COLONOSCOPY;  Surgeon: Rogene Houston, MD;  [facility];  Service: Endoscopy;  Laterality: N/A;  1:00  . COLONOSCOPY N/A 11/09/2016   Procedure: COLONOSCOPY;  Surgeon: Rogene Houston, MD;  [facility];  Service: Endoscopy;  Laterality: N/A;  1030  . MAZE N/A 09/05/2018   Procedure: MAZE;  Surgeon: Rexene Alberts, MD;  Location: [place];  Service: Open Heart Surgery;  Laterality: N/A;  . MITRAL VALVE REPLACEMENT N/A 09/05/2018   Procedure: MITRAL VALVE (MV) REPLACEMENT USING MAGNA MITRAL EASE 29MM BIOPROSTHESIS VALVE;  Surgeon: Rexene Alberts, MD;  Location: [place];  Service: Open Heart Surgery;  Laterality: N/A;  . REPLACEMENT ASCENDING AORTA N/A 09/05/2018   Procedure: SUPRACORONARY STRAIGHT GRAFT REPLACEMENT OF ASCENDING AORTA;  Surgeon: Rexene Alberts, MD;  Location: [place];  Service: Open Heart Surgery;  Laterality: N/A;  . RIGHT/LEFT HEART CATH AND CORONARY ANGIOGRAPHY N/A 08/29/2018   Procedure: RIGHT/LEFT HEART CATH AND CORONARY ANGIOGRAPHY;  Surgeon: Lorretta Harp, MD;  Location: [place] [facility];  Service: Cardiovascular;  Laterality: N/A;  . TEE WITHOUT CARDIOVERSION N/A 08/29/2018   Procedure: TRANSESOPHAGEAL ECHOCARDIOGRAM (TEE);  Surgeon: Skeet Latch, MD;  Location: [place];  Service: Cardiovascular;  Laterality: N/A;  . TRICUSPID VALVE REPLACEMENT N/A 09/05/2018   Procedure: TRICUSPID VALVE REPAIR USING EDWARDS MC3 TRICUSPID ANNULOPLASTY RING SIZE T28;  Surgeon: Rexene Alberts, MD;  Location: [place];  Service: Open Heart Surgery;  Laterality: N/A;  . TUBAL  LIGATION       OB History   No obstetric history on file.      Home Medications    Prior to Admission medications   Medication Sig Start Date End Date Taking? Authorizing Provider  aspirin EC 81 MG EC tablet Take 1 tablet (81 mg total) by mouth daily. 09/14/18   Antony Odea, PA-C  chlorpheniramine (CHLOR-TRIMETON) 4 MG tablet Take 4 mg by mouth daily as needed for allergies.    [provider]  ferrous [id number] C-folic acid (TRINSICON / FOLTRIN) capsule Take 1 capsule by mouth daily with breakfast. 09/14/18   Roddenberry, Arlis Porta, PA-C  ondansetron (ZOFRAN) 4 MG tablet Take 1 tablet (4 mg total) by mouth every 8 (eight) hours as needed for nausea or vomiting. 09/23/18 09/23/19  Jadene Pierini E, PA-C  pantoprazole (PROTONIX) 40 MG tablet Take 1 tablet (40 mg total) by mouth daily. 09/23/18   Imogene Burn, PA-C  traMADol (ULTRAM) 50 MG tablet Take 1 tablet (50 mg total) by mouth every 4 (four) hours as needed for moderate pain. 09/19/18   Rexene Alberts, MD  warfarin (COUMADIN) 2.5 MG tablet Increase coumadin to 1 1/2 tablets daily 10/01/18   Herminio Commons, MD  Family History Family History  Problem Relation Age of Onset  . Colon cancer Daughter     Social History Social History   Tobacco Use  . Smoking status: Former Smoker    Packs/day: 0.50    Years: 10.00    Pack years: 5.00  . Smokeless tobacco: Never Used  Substance Use Topics  . Alcohol use: No  . Drug use: No     Allergies   Bee venom, Boniva [ibandronic acid], Lisinopril, and Latex   Review of Systems Review of Systems  Constitutional: Positive for appetite change. Negative for fever.  HENT: Negative for congestion and sore throat.   Eyes: Negative.   Respiratory: Negative for chest tightness and shortness of breath.   Cardiovascular: Negative for chest pain.  Gastrointestinal: Positive for nausea. Negative for abdominal pain, blood in stool and vomiting.  Genitourinary:  Negative.   Musculoskeletal: Negative for arthralgias, joint swelling and neck pain.  Skin: Negative.  Negative for rash and wound.  Neurological: Positive for weakness. Negative for dizziness, light-headedness, numbness and headaches.  Psychiatric/Behavioral: Negative.      Physical Exam Updated Vital Signs BP 103/63   Pulse 92   Temp 98.4 F (36.9 C) (Temporal)   Resp 19   SpO2 95%   Physical Exam Vitals signs and nursing note reviewed.  Constitutional:      Appearance: She is well-developed.  HENT:     Head: Normocephalic and atraumatic.  Eyes:     Conjunctiva/sclera: Conjunctivae normal.  Neck:     Musculoskeletal: Normal range of motion.  Cardiovascular:     Rate and Rhythm: Normal rate and regular rhythm.  Pulmonary:     Effort: Pulmonary effort is normal.     Breath sounds: Normal breath sounds. No wheezing.  Abdominal:     General: Bowel sounds are normal. There is no distension.     Palpations: Abdomen is soft.     Tenderness: There is no abdominal tenderness.  Genitourinary:    Rectum: Guaiac result positive. No tenderness, anal fissure or external hemorrhoid. Normal anal tone.     Comments: No stool in vault, residue only, black and strongly hemoccult positive. Musculoskeletal: Normal range of motion.  Skin:    General: Skin is warm and dry.  Neurological:     General: No focal deficit present.     Mental Status: She is alert.      ED Treatments / Results  Labs (all labs ordered are listed, but only abnormal results are displayed) Labs Reviewed  COMPREHENSIVE METABOLIC PANEL - Abnormal; Notable for the following components:      Result Value   Glucose, Bld 106 (*)    BUN 33 (*)    All other components within normal limits  CBC - Abnormal; Notable for the following components:   RBC 2.45 (*)    Hemoglobin 7.6 (*)    HCT 24.5 (*)    All other components within normal limits  PROTIME-INR - Abnormal; Notable for the following components:    Prothrombin Time 20.0 (*)    INR 1.7 (*)    All other components within normal limits  POC OCCULT BLOOD, ED - Abnormal; Notable for the following components:   Fecal Occult Bld POSITIVE (*)    All other components within normal limits  SARS CORONAVIRUS 2 (HOSPITAL ORDER, [place] [facility])  TYPE AND SCREEN  PREPARE RBC (CROSSMATCH)    EKG None  Radiology No results found.  Procedures Procedures (including critical care  time)  Medications Ordered in ED Medications  0.9 %  sodium chloride infusion (has no administration in time range)     Initial Impression / Assessment and Plan / ED Course  I have reviewed the triage vital signs and the nursing notes.  Pertinent labs & imaging results that were available during my care of the patient were reviewed by me and considered in my medical decision making (see chart for details).        Pt with symptomatic anemia, hemoccult positive with cardiac surgery per above 09/05/18.  Will require admission.  Discussed case with Dr Harrington Challenger of cardiology regarding continuing coumadin.  Pt is currently subtherapeutic with her INR at 1.7.  Needs to be anticoagulated for several months while valves heal.  Could could coumadin and bridge with heparin while working up GI bleed.  Also states can develop clots around the surgery site which can drop the hgb. Recommends echo and/or ETT during the stay to assess for this possibility.  Cardiology can consult here tomorrow.   Call placed to hospitalists for admission. Discussed with Dr. Posey Pronto who accepts pt for admission.  Call placed to GI Dr. Oneida Alar who is aware of patient and will consult.  CRITICAL CARE Performed by: Evalee Jefferson Total critical care time: 40 minutes Critical care time was exclusive of separately billable procedures and treating other patients. Critical care was necessary to treat or prevent imminent or life-threatening deterioration. Critical care was time spent  personally by me on the following activities: development of treatment plan with patient and/or surrogate as well as nursing, discussions with consultants, evaluation of patient's response to treatment, examination of patient, obtaining history from patient or surrogate, ordering and performing treatments and interventions, ordering and review of laboratory studies, ordering and review of radiographic studies, pulse oximetry and re-evaluation of patient's condition.   Final Clinical Impressions(s) / ED Diagnoses   Final diagnoses:  Anemia due to GI blood loss  Weakness    ED Discharge Orders    None       Landis Martins 10/07/18 Earl Gala, MD 10/08/18 1716    Evalee Jefferson, PA-C 11/04/18 1153    Daleen Bo, MD 11/07/18 [phone]

## 2018-10-07 NOTE — H&P (Signed)
History and Physical    Hannah Roy XBD:[id number] DOB: 02/05/1941 DOA: 10/07/2018  PCP: Kathyrn Drown, MD  Patient coming from: Home  I have personally briefly reviewed patient's old medical records in [place]  Chief Complaint: Symptomatic anemia  HPI: Hannah Roy is a 78 y.o. female with medical history significant for persistent atrial fibrillation, rheumatic valvular disease, and hypertension who was recently hospitalized from 08/31/2018-09/13/2018.  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  She was discharged on Coumadin for anticoagulation.  Her antiarrhythmic/rate control and antihypertensive medications were discontinued due to bradycardia/orthostasis.    Patient states on discharge she felt well until about 7-10 days ago.  She began to feel generalized fatigue and will get short of breath after walking for about 30 minutes.  She has been noticing dark black color stool however felt this was related to her iron supplement.  She has not noticed any bright red blood per rectum or other obvious bleeding.  She followed up with her cardiologist office earlier today 10/07/2018.  Follow-up lab work was obtained which was revealing for hemoglobin 7.4 compared to 9.8 on 09/12/2018.  She has: Advised to present to the ED for further evaluation and management.  Patient otherwise denies any chest pain other than some soreness at her surgical site, shortness of breath while at rest, fevers, chills, diaphoresis, abdominal pain.  ED Course:  Initial vitals showed BP 113/78, pulse 99, RR 16, temp 98.4 Fahrenheit, SPO2 100% on room air.  Labs are notable for hemoglobin 7.6, WBC 7.0, platelets 306,000, sodium 141, potassium 4.3, BUN 33, creatinine 0.88, INR 1.7.  FOBT was positive.  SARS-CoV-2 test was negative.   The EDP discussed the case with cardiology who recommended continue anticoagulation given recent cardiac surgery, consider continue Coumadin with heparin bridge.  Also recommended repeat echocardiogram, and cardiology consult tomorrow.  EDP also discussed the case with on-call GI who are aware and will consult as well.  Patient was ordered to receive transfusion of 1 unit PRBC and the hospitalist service was consulted to admit for further evaluation and management.  Review of Systems: All systems reviewed and are negative except as documented in history of present illness above.   Past Medical History:  Diagnosis Date  . Aortic atherosclerosis ([place])   . Aortic insufficiency, rheumatic   . 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 Atrial fibrillation ([place]) 05/10/2018  . Colon polyps   . Essential hypertension, benign   . History of seasonal allergies   . Hypertension   . Hypothyroidism   . Mitral stenosis with regurgitation, rheumatic   . Osteopenia 2006  . Persistent atrial fibrillation   . 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   21 mm [facility] Ease stented bovine pericardial tissue valve  . S/P ascending aortic aneurysm repair 09/05/2018   28 mm Hemashield platinum supracoronary straight graft  . S/P Maze operation for atrial fibrillation 09/05/2018   Complete bilateral atrial lesion set using bipolar radiofrequency and cryothermy ablation with clipping of LA appendage  . 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   29 mm [facility] Mitral stented bovine pericardial tissue valve  . 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 Tricuspid regurgitation     Past Surgical History:  Procedure Laterality Date  .  AORTIC VALVE REPLACEMENT N/A 09/05/2018   Procedure: AORTIC VALVE REPLACEMENT (AVR) USING MAGNA EASE 21MM AOTIC BIOPROSTHESIS VALVE;  Surgeon: Rexene Alberts, MD;  Location: [place];  Service:  Open Heart Surgery;  Laterality: N/A;  . APPENDECTOMY    . CLIPPING OF ATRIAL APPENDAGE  09/05/2018   Procedure: Clipping Of Atrial Appendage;  Surgeon: Rexene Alberts, MD;  Location: [place];  Service: Open Heart Surgery;;  . COLONOSCOPY  03/23/2011   repeat in 5 years,Procedure: COLONOSCOPY;  Surgeon: Rogene Houston, MD;  [facility];  Service: Endoscopy;  Laterality: N/A;  1:00  . COLONOSCOPY N/A 11/09/2016   Procedure: COLONOSCOPY;  Surgeon: Rogene Houston, MD;  [facility];  Service: Endoscopy;  Laterality: N/A;  1030  . MAZE N/A 09/05/2018   Procedure: MAZE;  Surgeon: Rexene Alberts, MD;  Location: [place];  Service: Open Heart Surgery;  Laterality: N/A;  . MITRAL VALVE REPLACEMENT N/A 09/05/2018   Procedure: MITRAL VALVE (MV) REPLACEMENT USING MAGNA MITRAL EASE 29MM BIOPROSTHESIS VALVE;  Surgeon: Rexene Alberts, MD;  Location: [place];  Service: Open Heart Surgery;  Laterality: N/A;  . REPLACEMENT ASCENDING AORTA N/A 09/05/2018   Procedure: SUPRACORONARY STRAIGHT GRAFT REPLACEMENT OF ASCENDING AORTA;  Surgeon: Rexene Alberts, MD;  [facility];  Service: Open Heart Surgery;  Laterality: N/A;  . RIGHT/LEFT HEART CATH AND CORONARY ANGIOGRAPHY N/A 08/29/2018   Procedure: RIGHT/LEFT HEART CATH AND CORONARY ANGIOGRAPHY;  Surgeon: Lorretta Harp, MD;  Location: [place] [facility];  Service: Cardiovascular;  Laterality: N/A;  . TEE WITHOUT CARDIOVERSION N/A 08/29/2018   Procedure: TRANSESOPHAGEAL ECHOCARDIOGRAM (TEE);  Surgeon: Skeet Latch, MD;  Location: [place];  Service: Cardiovascular;  Laterality: N/A;  . TRICUSPID VALVE REPLACEMENT N/A 09/05/2018   Procedure: TRICUSPID VALVE REPAIR USING EDWARDS MC3 TRICUSPID ANNULOPLASTY RING SIZE T28;  Surgeon: Rexene Alberts, MD;  Location: [place];  Service: Open Heart Surgery;  Laterality: N/A;  . TUBAL LIGATION      Social History:  reports that she has quit smoking. She has a 5.00 pack-year smoking history.  She has never used smokeless tobacco. She reports that she does not drink alcohol or use drugs.  Allergies  Allergen Reactions  . Bee Venom Anaphylaxis  . Boniva [Ibandronic Acid] Nausea And Vomiting and Other (See Comments)    Upset stomach, flu like symptoms   . Lisinopril Cough  . Latex Rash    Family History  Problem Relation Age of Onset  . Colon cancer Daughter      Prior to Admission medications   Medication Sig Start Date End Date Taking? Authorizing Provider  aspirin EC 81 MG EC tablet Take 1 tablet (81 mg total) by mouth daily. 09/14/18   Antony Odea, PA-C  chlorpheniramine (CHLOR-TRIMETON) 4 MG tablet Take 4 mg by mouth daily as needed for allergies.    [provider]  ferrous [id number] C-folic acid (TRINSICON / FOLTRIN) capsule Take 1 capsule by mouth daily with breakfast. 09/14/18   Roddenberry, Arlis Porta, PA-C  ondansetron (ZOFRAN) 4 MG tablet Take 1 tablet (4 mg total) by mouth every 8 (eight) hours as needed for nausea or vomiting. 09/23/18 09/23/19  Jadene Pierini E, PA-C  pantoprazole (PROTONIX) 40 MG tablet Take 1 tablet (40 mg total) by mouth daily. 09/23/18   Imogene Burn, PA-C  traMADol (ULTRAM) 50 MG tablet Take 1 tablet (50 mg total) by mouth every 4 (four) hours as needed for moderate pain.  09/19/18   Rexene Alberts, MD  warfarin (COUMADIN) 2.5 MG tablet Increase coumadin to 1 1/2 tablets daily 10/01/18   Herminio Commons, MD    Physical Exam: Vitals:   10/07/18 1900 10/07/18 1915 10/07/18 1930 10/07/18 1945  BP: 103/66     Pulse:  92 92 91  Resp: (!) 21 17 20 19   Temp:      TempSrc:      SpO2:  98% 96% 96%   Constitutional: Resting supine in bed, NAD, calm, comfortable Eyes: PERRL, lids and conjunctivae normal ENMT: Mucous membranes are moist. Posterior pharynx clear of any exudate or lesions.Normal dentition.  Neck: normal, supple, no masses. Respiratory: clear to auscultation bilaterally, no wheezing, no crackles. Normal  respiratory effort. No accessory muscle use.  Cardiovascular: Regular rate and rhythm. No extremity edema. 2+ pedal pulses. Abdomen: Mild generalized tenderness, no masses palpated. No hepatosplenomegaly. Bowel sounds positive.  Musculoskeletal: no clubbing / cyanosis. No joint deformity upper and lower extremities. Good ROM, no contractures. Normal muscle tone.  Skin: Sternotomy surgical scar appears clean, dry, intact without obvious infection Neurologic: CN 2-12 grossly intact. Sensation intact, Strength 5/5 in all 4.  Psychiatric: Normal judgment and insight. Alert and oriented x 3. Normal mood.     Labs on Admission: I have personally reviewed following labs and imaging studies  CBC: Recent Labs  Lab 10/07/18 1343 10/07/18 1543  WBC 7.1 7.0  HGB 7.4* 7.6*  HCT 24.2* 24.5*  MCV 100.8* 100.0  PLT 276 361   Basic Metabolic Panel: Recent Labs  Lab 10/07/18 1343 10/07/18 1543  NA 139 141  K 4.7 4.3  CL 105 107  CO2 24 23  GLUCOSE 109* 106*  BUN 31* 33*  CREATININE 0.62 0.88  CALCIUM 9.0 9.2   GFR: Estimated Creatinine Clearance: 41 mL/min (by C-G formula based on SCr of 0.88 mg/dL). Liver Function Tests: Recent Labs  Lab 10/07/18 1543  AST 17  ALT 15  ALKPHOS 74  BILITOT 0.4  PROT 7.5  ALBUMIN 4.0   No results for input(s): LIPASE, AMYLASE in the last 168 hours. No results for input(s): AMMONIA in the last 168 hours. Coagulation Profile: Recent Labs  Lab 10/01/18 1436 10/07/18 1543  INR 1.3* 1.7*   Cardiac Enzymes: No results for input(s): CKTOTAL, CKMB, CKMBINDEX, TROPONINI in the last 168 hours. BNP (last 3 results) No results for input(s): PROBNP in the last 8760 hours. HbA1C: No results for input(s): HGBA1C in the last 72 hours. CBG: No results for input(s): GLUCAP in the last 168 hours. Lipid Profile: No results for input(s): CHOL, HDL, LDLCALC, TRIG, CHOLHDL, LDLDIRECT in the last 72 hours. Thyroid Function Tests: No results for input(s):  TSH, T4TOTAL, FREET4, T3FREE, THYROIDAB in the last 72 hours. Anemia Panel: No results for input(s): VITAMINB12, FOLATE, FERRITIN, TIBC, IRON, RETICCTPCT in the last 72 hours. Urine analysis:    Component Value Date/Time   COLORURINE YELLOW 09/04/2018 1400   APPEARANCEUR CLEAR 09/04/2018 1400   LABSPEC 1.014 09/04/2018 1400   PHURINE 6.0 09/04/2018 1400   GLUCOSEU NEGATIVE 09/04/2018 1400   HGBUR SMALL (A) 09/04/2018 1400   BILIRUBINUR NEGATIVE 09/04/2018 1400   KETONESUR NEGATIVE 09/04/2018 1400   PROTEINUR NEGATIVE 09/04/2018 1400   UROBILINOGEN 0.2 11/10/2010 2001   NITRITE NEGATIVE 09/04/2018 1400   LEUKOCYTESUR TRACE (A) 09/04/2018 1400    Radiological Exams on Admission: No results found.  EKG: Independently reviewed.  Sinus rhythm with RBBB.  Assessment/Plan Principal Problem:   Symptomatic anemia  Active Problems:   Persistent atrial fibrillation   S/P aortic + mitral valve replacement with bioprosthetic valves + tricuspid valve repair + maze procedure + repair ascending aortic aneurysm  EMBERLIE Roy is a 78 y.o. female with medical history significant for persistent atrial fibrillation, rheumatic valvular disease, and hypertension who was recently hospitalized from 08/31/2018-09/13/2018 andunderwent open heart surgery on 09/05/2018 for AVR with bioprosthetic tissue valve, MVR with bioprosthetic tissue valve, tricuspid valve repair by annuloplasty, maze procedure, and repair of ascending thoracic aortic aneurysm who is admitted with symptomatic anemia.   Symptomatic anemia from suspected GI source in setting of Coumadin use: FOBT positive, hemoglobin currently stable at 7.6.  Will need continued anticoagulation per cardiology for at least 2 months post recent cardiac surgery. -Start IV heparin anticoagulation per pharmacy -Transfusion 1 unit PRBC ordered -Monitor for signs/symptoms of further bleeding, repeat CBC in a.m. -GI aware and will consult on patient  Status post  AVR and MVR with bioprosthetic valves, tricuspid valve repair, Maze procedure, repair of ascending aortic aneurysm 09/05/18: Per cardiology, discontinued anticoagulation as above. -Cardiology to see tomorrow -Continue IV heparin as above -Repeat echocardiogram ordered  Atrial fibrillation: S/p maze procedure and clipping of left atrial appendage 08/28/2018.  Rate currently controlled. -Appears in sinus rhythm on admission -Continue IV heparin as above   DVT prophylaxis: Heparin drip Code Status: Full code, confirmed with patient Family Communication: None present on admission Disposition Plan: Likely discharge to home pending clinical progress Consults called: Cardiology, GI Admission status: Observation   Zada Finders MD [facility]  If 7PM-7AM, please contact night-coverage [email or web address]  10/07/2018, 8:05 PM

## 2018-10-07 NOTE — ED Notes (Signed)
Occu stool performed by Evalee Jefferson PA. Aware of positive stool sample.

## 2018-10-07 NOTE — Progress Notes (Signed)
ANTICOAGULATION CONSULT NOTE - Initial Consult  Pharmacy Consult for heparin gtt  Indication: s/p AVR, MVR, aortic aneurysm repair w/ symptomatic anemia. Requiring continued anticoagulation post cardiac surgery.  Allergies  Allergen Reactions  . Bee Venom Anaphylaxis  . Boniva [Ibandronic Acid] Nausea And Vomiting and Other (See Comments)    Upset stomach, flu like symptoms   . Lisinopril Cough  . Latex Rash    Patient Measurements:   Heparin Dosing Weight:     Vital Signs: Temp: 98.4 F (36.9 C) (06/29 1510) Temp Source: Temporal (06/29 1510) BP: 103/66 (06/29 1900) Pulse Rate: 92 (06/29 1845)  Labs: Recent Labs    10/07/18 1343 10/07/18 1543  HGB 7.4* 7.6*  HCT 24.2* 24.5*  PLT 276 306  LABPROT  --  20.0*  INR  --  1.7*  CREATININE 0.62 0.88    Estimated Creatinine Clearance: 41 mL/min (by C-G formula based on SCr of 0.88 mg/dL).   Medical History: Past Medical History:  Diagnosis Date  . Aortic atherosclerosis ([place])   . Aortic insufficiency, rheumatic   . 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 Atrial fibrillation ([place]) 05/10/2018  . Colon polyps   . Essential hypertension, benign   . History of seasonal allergies   . Hypertension   . Hypothyroidism   . Mitral stenosis with regurgitation, rheumatic   . Osteopenia 2006  . Persistent atrial fibrillation   . 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   21 mm [facility] Ease stented bovine pericardial tissue valve  . S/P ascending aortic aneurysm repair 09/05/2018   28 mm Hemashield platinum supracoronary straight graft  . S/P Maze operation for atrial fibrillation 09/05/2018   Complete bilateral atrial lesion set using bipolar radiofrequency and cryothermy ablation with clipping of LA appendage  . 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   29 mm [facility] Mitral stented bovine pericardial tissue valve  . 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   28 mm Edwards  mc3 ring annuloplasty  . Tricuspid regurgitation     Medications:  (Not in a hospital admission)  Scheduled:  . heparin  3,000 Units Intravenous Once  . pantoprazole  40 mg Oral Daily  . sodium chloride flush  3 mL Intravenous Q12H   Infusions:  . sodium chloride    . heparin     PRN: acetaminophen **OR** acetaminophen Anti-infectives (From admission, onward)   None      Assessment: Hannah Roy a 78 y.o. female requires anticoagulation with a heparin iv infusion for the indication of  atrial fibrillation. Heparin gtt will be started following pharmacy protocol per pharmacy consult. Patient is not on previous oral anticoagulant that will require aPTT/HL correlation before transitioning to only HL monitoring.   Patient has INR 1.7, was admitted on warfarin home dose 3.75mg  po daily. Last dose 6/28  Goal of Therapy:  Heparin level 0.3-0.7 units/ml Monitor platelets by anticoagulation protocol: Yes   Plan:  Give 3000 units bolus x 1 Start heparin infusion at 850 units/hr Check anti-Xa level in 8 hours and daily while on heparin Continue to monitor H&H and platelets  Heparin level to be drawn in 8 hours for patients >2 years old or crcl < 52ml/min  Hannah Roy 10/07/2018,7:34 PM

## 2018-10-07 NOTE — ED Triage Notes (Addendum)
Pt c/o of weakness starting this afternoon after having blood drawn.  PCP called to left her know Hgb was low.  Pt states having black stools

## 2018-10-08 ENCOUNTER — Observation Stay (HOSPITAL_COMMUNITY): Payer: Medicare Other | Admitting: Anesthesiology

## 2018-10-08 ENCOUNTER — Encounter (HOSPITAL_COMMUNITY): Payer: Self-pay | Admitting: Gastroenterology

## 2018-10-08 ENCOUNTER — Observation Stay (HOSPITAL_BASED_OUTPATIENT_CLINIC_OR_DEPARTMENT_OTHER): Payer: Medicare Other

## 2018-10-08 ENCOUNTER — Encounter (HOSPITAL_COMMUNITY)
Admission: EM | Disposition: A | Discharge status: Home / Self Care | Payer: Self-pay | Source: Home / Self Care | Attending: Internal Medicine

## 2018-10-08 ENCOUNTER — Inpatient Hospital Stay (HOSPITAL_COMMUNITY): Payer: Medicare Other

## 2018-10-08 DIAGNOSIS — I4510 Unspecified right bundle-branch block: Secondary | ICD-10-CM | POA: Diagnosis not present

## 2018-10-08 DIAGNOSIS — D62 Acute posthemorrhagic anemia: Secondary | ICD-10-CM | POA: Diagnosis not present

## 2018-10-08 DIAGNOSIS — Z953 Presence of xenogenic heart valve: Secondary | ICD-10-CM | POA: Diagnosis not present

## 2018-10-08 DIAGNOSIS — Z800 Family history of malignant neoplasm of digestive organs: Secondary | ICD-10-CM | POA: Diagnosis not present

## 2018-10-08 DIAGNOSIS — D500 Iron deficiency anemia secondary to blood loss (chronic): Secondary | ICD-10-CM | POA: Diagnosis not present

## 2018-10-08 DIAGNOSIS — K260 Acute duodenal ulcer with hemorrhage: Secondary | ICD-10-CM | POA: Diagnosis not present

## 2018-10-08 DIAGNOSIS — K264 Chronic or unspecified duodenal ulcer with hemorrhage: Secondary | ICD-10-CM | POA: Diagnosis not present

## 2018-10-08 DIAGNOSIS — K2970 Gastritis, unspecified, without bleeding: Secondary | ICD-10-CM | POA: Diagnosis not present

## 2018-10-08 DIAGNOSIS — D689 Coagulation defect, unspecified: Secondary | ICD-10-CM | POA: Diagnosis not present

## 2018-10-08 DIAGNOSIS — D649 Anemia, unspecified: Secondary | ICD-10-CM | POA: Diagnosis not present

## 2018-10-08 DIAGNOSIS — Z888 Allergy status to other drugs, medicaments and biological substances status: Secondary | ICD-10-CM | POA: Diagnosis not present

## 2018-10-08 DIAGNOSIS — K921 Melena: Secondary | ICD-10-CM

## 2018-10-08 DIAGNOSIS — I361 Nonrheumatic tricuspid (valve) insufficiency: Secondary | ICD-10-CM | POA: Diagnosis not present

## 2018-10-08 DIAGNOSIS — E039 Hypothyroidism, unspecified: Secondary | ICD-10-CM | POA: Diagnosis not present

## 2018-10-08 DIAGNOSIS — I4819 Other persistent atrial fibrillation: Secondary | ICD-10-CM

## 2018-10-08 DIAGNOSIS — Z9889 Other specified postprocedural states: Secondary | ICD-10-CM

## 2018-10-08 DIAGNOSIS — K922 Gastrointestinal hemorrhage, unspecified: Secondary | ICD-10-CM | POA: Diagnosis not present

## 2018-10-08 DIAGNOSIS — Z91030 Bee allergy status: Secondary | ICD-10-CM | POA: Diagnosis not present

## 2018-10-08 DIAGNOSIS — K269 Duodenal ulcer, unspecified as acute or chronic, without hemorrhage or perforation: Secondary | ICD-10-CM | POA: Diagnosis not present

## 2018-10-08 DIAGNOSIS — R1310 Dysphagia, unspecified: Secondary | ICD-10-CM | POA: Diagnosis not present

## 2018-10-08 DIAGNOSIS — I071 Rheumatic tricuspid insufficiency: Secondary | ICD-10-CM | POA: Diagnosis not present

## 2018-10-08 DIAGNOSIS — Z7901 Long term (current) use of anticoagulants: Secondary | ICD-10-CM | POA: Diagnosis not present

## 2018-10-08 DIAGNOSIS — Z87891 Personal history of nicotine dependence: Secondary | ICD-10-CM | POA: Diagnosis not present

## 2018-10-08 DIAGNOSIS — Z20828 Contact with and (suspected) exposure to other viral communicable diseases: Secondary | ICD-10-CM | POA: Diagnosis not present

## 2018-10-08 DIAGNOSIS — Z91040 Latex allergy status: Secondary | ICD-10-CM | POA: Diagnosis not present

## 2018-10-08 DIAGNOSIS — Z7982 Long term (current) use of aspirin: Secondary | ICD-10-CM | POA: Diagnosis not present

## 2018-10-08 DIAGNOSIS — I480 Paroxysmal atrial fibrillation: Secondary | ICD-10-CM | POA: Diagnosis not present

## 2018-10-08 DIAGNOSIS — I700 Atherosclerosis of aorta: Secondary | ICD-10-CM | POA: Diagnosis not present

## 2018-10-08 DIAGNOSIS — I10 Essential (primary) hypertension: Secondary | ICD-10-CM | POA: Diagnosis not present

## 2018-10-08 HISTORY — PX: IR EMBO ART  VEN HEMORR LYMPH EXTRAV  INC GUIDE ROADMAPPING: IMG5450

## 2018-10-08 HISTORY — PX: ESOPHAGOGASTRODUODENOSCOPY (EGD) WITH PROPOFOL: SHX5813

## 2018-10-08 HISTORY — PX: IR ANGIOGRAM VISCERAL SELECTIVE: IMG657

## 2018-10-08 HISTORY — PX: IR US GUIDE VASC ACCESS RIGHT: IMG2390

## 2018-10-08 HISTORY — PX: IR ANGIOGRAM SELECTIVE EACH ADDITIONAL VESSEL: IMG667

## 2018-10-08 LAB — PROTIME-INR
INR: 2.2 — ABNORMAL HIGH (ref 0.8–1.2)
INR: 1.7 — ABNORMAL HIGH (ref 0.8–1.2)
Prothrombin Time: 23.7 seconds — ABNORMAL HIGH (ref 11.4–15.2)
Prothrombin Time: 19.5 seconds — ABNORMAL HIGH (ref 11.4–15.2)

## 2018-10-08 LAB — CBC
HCT: 20.8 % — ABNORMAL LOW (ref 36.0–46.0)
HCT: 22.5 % — ABNORMAL LOW (ref 36.0–46.0)
HCT: 20.8 % — ABNORMAL LOW (ref 36.0–46.0)
Hemoglobin: 6.6 g/dL — CL (ref 12.0–15.0)
Hemoglobin: 7.2 g/dL — ABNORMAL LOW (ref 12.0–15.0)
Hemoglobin: 6.7 g/dL — CL (ref 12.0–15.0)
MCH: 30 pg (ref 26.0–34.0)
MCH: 30.1 pg (ref 26.0–34.0)
MCH: 29.6 pg (ref 26.0–34.0)
MCHC: 31.7 g/dL (ref 30.0–36.0)
MCHC: 32 g/dL (ref 30.0–36.0)
MCHC: 32.2 g/dL (ref 30.0–36.0)
MCV: 94.5 fL (ref 80.0–100.0)
MCV: 94.1 fL (ref 80.0–100.0)
MCV: 92 fL (ref 80.0–100.0)
Platelets: 216 10*3/uL (ref 150–400)
Platelets: 192 10*3/uL (ref 150–400)
Platelets: 173 10*3/uL (ref 150–400)
RBC: 2.2 MIL/uL — ABNORMAL LOW (ref 3.87–5.11)
RBC: 2.39 MIL/uL — ABNORMAL LOW (ref 3.87–5.11)
RBC: 2.26 MIL/uL — ABNORMAL LOW (ref 3.87–5.11)
RDW: 15.7 % — ABNORMAL HIGH (ref 11.5–15.5)
RDW: 16.9 % — ABNORMAL HIGH (ref 11.5–15.5)
RDW: 17.1 % — ABNORMAL HIGH (ref 11.5–15.5)
WBC: 6.2 10*3/uL (ref 4.0–10.5)
WBC: 8.4 10*3/uL (ref 4.0–10.5)
WBC: 14.7 10*3/uL — ABNORMAL HIGH (ref 4.0–10.5)
nRBC: 0 % (ref 0.0–0.2)
nRBC: 0 % (ref 0.0–0.2)
nRBC: 0 % (ref 0.0–0.2)

## 2018-10-08 LAB — BASIC METABOLIC PANEL
Anion gap: 8 (ref 5–15)
BUN: 38 mg/dL — ABNORMAL HIGH (ref 8–23)
CO2: 21 mmol/L — ABNORMAL LOW (ref 22–32)
Calcium: 7.9 mg/dL — ABNORMAL LOW (ref 8.9–10.3)
Chloride: 112 mmol/L — ABNORMAL HIGH (ref 98–111)
Creatinine, Ser: 0.46 mg/dL (ref 0.44–1.00)
GFR calc Af Amer: >60 mL/min (ref >60)
GFR calc non Af Amer: >60 mL/min (ref >60)
Glucose, Bld: 101 mg/dL — ABNORMAL HIGH (ref 70–99)
Potassium: 4 mmol/L (ref 3.5–5.1)
Sodium: 141 mmol/L (ref 135–145)

## 2018-10-08 LAB — ECHOCARDIOGRAM COMPLETE: Weight: 1872 oz

## 2018-10-08 LAB — PREPARE RBC (CROSSMATCH)

## 2018-10-08 LAB — HEPARIN LEVEL (UNFRACTIONATED): Heparin Unfractionated: 0.41 IU/mL (ref 0.30–0.70)

## 2018-10-08 LAB — MRSA PCR SCREENING: MRSA by PCR: NEGATIVE

## 2018-10-08 SURGERY — ESOPHAGOGASTRODUODENOSCOPY (EGD) WITH PROPOFOL
Anesthesia: General

## 2018-10-08 MED ORDER — SODIUM CHLORIDE 0.9 % IV SOLN
INTRAVENOUS | Status: DC
  Administered 2018-10-08: 15:00:00 via INTRAVENOUS

## 2018-10-08 MED ORDER — LACTATED RINGERS IV SOLN
INTRAVENOUS | Status: DC
  Administered 2018-10-08: 12:00:00 via INTRAVENOUS

## 2018-10-08 MED ORDER — TRAZODONE HCL 50 MG PO TABS
25.0000 mg | ORAL_TABLET | Freq: Once | ORAL | Status: AC
  Administered 2018-10-09: 25 mg via ORAL
  Filled 2018-10-08: qty 1

## 2018-10-08 MED ORDER — LIDOCAINE HCL 1 % IJ SOLN
INTRAMUSCULAR | Status: AC | PRN
  Administered 2018-10-08: 10 mL

## 2018-10-08 MED ORDER — SODIUM CHLORIDE 0.9 % IV SOLN
8.0000 mg/h | INTRAVENOUS | Status: AC
  Administered 2018-10-08 – 2018-10-11 (×7): 8 mg/h via INTRAVENOUS
  Filled 2018-10-08 (×14): qty 80

## 2018-10-08 MED ORDER — SODIUM CHLORIDE 0.9 % IV SOLN
80.0000 mg | Freq: Once | INTRAVENOUS | Status: AC
  Administered 2018-10-08: 80 mg via INTRAVENOUS
  Filled 2018-10-08: qty 80

## 2018-10-08 MED ORDER — EPINEPHRINE 1 MG/10ML IJ SOSY
PREFILLED_SYRINGE | INTRAMUSCULAR | Status: AC
  Filled 2018-10-08: qty 10

## 2018-10-08 MED ORDER — SODIUM CHLORIDE 0.9% IV SOLUTION: Freq: Once | INTRAVENOUS | Status: DC

## 2018-10-08 MED ORDER — MIDAZOLAM HCL 2 MG/2ML IJ SOLN
INTRAMUSCULAR | Status: AC
  Filled 2018-10-08: qty 4

## 2018-10-08 MED ORDER — PROPOFOL 10 MG/ML IV BOLUS
INTRAVENOUS | Status: DC | PRN
  Administered 2018-10-08 (×3): 10 mg via INTRAVENOUS
  Administered 2018-10-08: 20 mg via INTRAVENOUS

## 2018-10-08 MED ORDER — ZOLPIDEM TARTRATE 5 MG PO TABS
5.0000 mg | ORAL_TABLET | Freq: Once | ORAL | Status: DC
  Filled 2018-10-08: qty 1

## 2018-10-08 MED ORDER — FUROSEMIDE 10 MG/ML IJ SOLN
20.0000 mg | Freq: Once | INTRAMUSCULAR | Status: AC
  Administered 2018-10-08: 20 mg via INTRAVENOUS

## 2018-10-08 MED ORDER — LIDOCAINE VISCOUS HCL 2 % MT SOLN
OROMUCOSAL | Status: AC
  Filled 2018-10-08: qty 15

## 2018-10-08 MED ORDER — FUROSEMIDE 10 MG/ML IJ SOLN
20.0000 mg | Freq: Once | INTRAMUSCULAR | Status: DC
  Filled 2018-10-08: qty 2

## 2018-10-08 MED ORDER — VITAMIN K1 10 MG/ML IJ SOLN
2.5000 mg | INTRAVENOUS | Status: AC
  Administered 2018-10-08: 11:00:00 2.5 mg via INTRAVENOUS
  Filled 2018-10-08: qty 0.25

## 2018-10-08 MED ORDER — IOHEXOL 300 MG/ML  SOLN
150.0000 mL | Freq: Once | INTRAMUSCULAR | Status: AC | PRN
  Administered 2018-10-08: 50 mL via INTRA_ARTERIAL

## 2018-10-08 MED ORDER — FENTANYL CITRATE (PF) 100 MCG/2ML IJ SOLN
INTRAMUSCULAR | Status: AC | PRN
  Administered 2018-10-08: 25 ug via INTRAVENOUS

## 2018-10-08 MED ORDER — LIDOCAINE HCL 1 % IJ SOLN
INTRAMUSCULAR | Status: AC
  Filled 2018-10-08: qty 20

## 2018-10-08 MED ORDER — FENTANYL CITRATE (PF) 100 MCG/2ML IJ SOLN
INTRAMUSCULAR | Status: AC
  Filled 2018-10-08: qty 4

## 2018-10-08 MED ORDER — LIDOCAINE VISCOUS HCL 2 % MT SOLN: 5.0000 mL | Freq: Once | OROMUCOSAL | Status: DC

## 2018-10-08 MED ORDER — KETAMINE HCL 10 MG/ML IJ SOLN
INTRAMUSCULAR | Status: DC | PRN
  Administered 2018-10-08: 10 mg via INTRAVENOUS
  Administered 2018-10-08: 5 mg via INTRAVENOUS

## 2018-10-08 MED ORDER — KETAMINE HCL 50 MG/5ML IJ SOSY
PREFILLED_SYRINGE | INTRAMUSCULAR | Status: AC
  Filled 2018-10-08: qty 5

## 2018-10-08 MED ORDER — GLUCAGON HCL RDNA (DIAGNOSTIC) 1 MG IJ SOLR
INTRAMUSCULAR | Status: DC | PRN
  Administered 2018-10-08: .5 mg via INTRAVENOUS

## 2018-10-08 MED ORDER — MIDAZOLAM HCL 2 MG/2ML IJ SOLN
INTRAMUSCULAR | Status: AC | PRN
  Administered 2018-10-08: 0.5 mg via INTRAVENOUS

## 2018-10-08 MED ORDER — PANTOPRAZOLE SODIUM 40 MG IV SOLR
40.0000 mg | Freq: Two times a day (BID) | INTRAVENOUS | Status: DC
  Administered 2018-10-08: 40 mg via INTRAVENOUS
  Filled 2018-10-08: qty 40

## 2018-10-08 MED ORDER — SODIUM CHLORIDE (PF) 0.9 % IJ SOLN
PREFILLED_SYRINGE | INTRAMUSCULAR | Status: DC | PRN
  Administered 2018-10-08: 13:00:00 4 mL

## 2018-10-08 MED ORDER — FUROSEMIDE 10 MG/ML IJ SOLN
20.0000 mg | Freq: Once | INTRAMUSCULAR | Status: AC
  Administered 2018-10-09: 02:00:00 20 mg via INTRAVENOUS
  Filled 2018-10-08: qty 2

## 2018-10-08 MED ORDER — PHENYLEPHRINE HCL (PRESSORS) 10 MG/ML IV SOLN
INTRAVENOUS | Status: DC | PRN
  Administered 2018-10-08: 80 ug via INTRAVENOUS

## 2018-10-08 MED ORDER — ALPRAZOLAM 0.5 MG PO TABS
0.5000 mg | ORAL_TABLET | Freq: Once | ORAL | Status: AC
  Administered 2018-10-08: 0.5 mg via ORAL
  Filled 2018-10-08: qty 1

## 2018-10-08 MED ORDER — GLUCAGON HCL RDNA (DIAGNOSTIC) 1 MG IJ SOLR
INTRAMUSCULAR | Status: AC
  Filled 2018-10-08: qty 1

## 2018-10-08 NOTE — Progress Notes (Signed)
Called Dr. Denton Brick with lab of hemo 6.7

## 2018-10-08 NOTE — Progress Notes (Signed)
Patient taken to ICU-4 at 0610 via wheelchair.  Pt ambulated from wheelchair to bed.  Pt is no acute distress.  Pt remains anxious about her hemoglobin dropping.  Reassurance and emotional support provided.  Pt belongings taken down with pt.  Report given to Jeneen Rinks, Therapist, sports.

## 2018-10-08 NOTE — Op Note (Addendum)
[facility] Patient Name: Hannah Roy Procedure Date: 10/08/2018 11:39 AM MRN: [id number] Date of Birth: [birth year] Attending MD: Barney Drain MD, MD CSN: [id number] Age: 78 Admit Type: Inpatient Procedure:                Upper GI endoscopy WITH EPI INJECTION Indications:              Hematochezia, Melena, Active gastrointestinal                            bleeding ON ASA/COUMADIN, NO PPI. Providers:                Barney Drain MD, MD, Nelda Severe, RN, Randa Spike, Technician Referring MD:             Elayne Snare Luking Medicines:                Propofol per Anesthesia Complications:            No immediate complications. Estimated Blood Loss:     Estimated blood loss was minimal. Procedure:                Pre-Anesthesia Assessment:                           - Prior to the procedure, a History and Physical                            was performed, and patient medications and                            allergies were reviewed. The patient's tolerance of                            previous anesthesia was also reviewed. The risks                            and benefits of the procedure and the sedation                            options and risks were discussed with the patient.                            All questions were answered, and informed consent                            was obtained. Prior Anticoagulants: The patient has                            taken no previous anticoagulant or antiplatelet                            agents except for aspirin. ASA Grade Assessment: II                            -  A patient with mild systemic disease. After                            reviewing the risks and benefits, the patient was                            deemed in satisfactory condition to undergo the                            procedure. After obtaining informed consent, the                            endoscope was passed under direct vision.                   Throughout the procedure, the patient's blood                            pressure, pulse, and oxygen saturations were                            monitored continuously. The GIF-H190 ([id number])                            scope was introduced through the mouth, and                            advanced to the duodenal bulb. The upper GI                            endoscopy was accomplished without difficulty. The                            patient tolerated the procedure fairly well. Scope In: 12:26:58 PM Scope Out: 12:39:57 PM Total Procedure Duration: 0 hours 12 minutes 59 seconds  Findings:      The examined esophagus was normal.      Diffuse moderate inflammation characterized by congestion (edema),       erythema and friability was found in the entire examined stomach. FRESH       BLOOD/CLOTS AT THE PYLORUS.      Red blood was found in the duodenal bulb.      One oozing cratered duodenal ulcer with adherent clot was found AT THE       JUNCTION OF D1/D2 ON THE ANTERIOR WALL. Area was unsuccessfully injected       with 2 mL of a 1:10,000 solution of epinephrine for hemostasis, BUT       ACTIVE OOZING AFTER INJECTION. UNABLE TO DIRECTLY VISUALIZE THE CENTRAL       PORTION OF THE LESION. ADHERENT CLOT PRESENT. SCOPE WOULD NOT REMAIN AT       THE JUNCTION OF D1/D2. EGD ABORTED.      The second portion of the duodenum was normal EXCEPT FOR FRESH BLOOD IN       THE LUMEN. Impression:               - UGI BLEED DUE TO ULCER AT JUNCTION D1/D2, NOT  AMENABLE TO ENDOSCOPIC THERAPY.                           - H PYLORI STATUS UNKNOWN                           - No specimens collected. Moderate Sedation:      Per Anesthesia Care Recommendation:           - Transfer patient to another hospital.                           - CBC STAT                           - NPO.                           - Give Protonix (pantoprazole): initiate therapy                             with 80 mg IV bolus, then 8 mg/hr IV by continuous                            infusion.                           - DISCUSSED CASE AND NEED FOR TRANSFER FOR IR                            ANGIO/EMBOLIZATION WITH DR. Denton Brick: 1253, HER                            DAUGHTER LISA: 1255, AND IR(TREVOR SCHICK: 1257).                           - SPOKE WITH DR, MCDOWELL. OK TO HOLD                            ANTICOAGULATION AT THIS TIME.                           - HOLD ASA.                           -CONSIDER H PYLORI BREATH TEST OR STOOL ANTIGEN IN                            THE FUTURE. Procedure Code(s):        --- Professional ---                           [phone], Esophagogastroduodenoscopy, flexible,                            transoral; with control of bleeding, any method Diagnosis Code(s):        --- Professional ---  K29.70, Gastritis, unspecified, without bleeding                           K92.2, Gastrointestinal hemorrhage, unspecified                           K26.4, Chronic or unspecified duodenal ulcer with                            hemorrhage                           K92.1, Melena (includes Hematochezia) CPT copyright 2019 [employer] Association. All rights reserved. The codes documented in this report are preliminary and upon coder review may  be revised to meet current compliance requirements. Barney Drain, MD Barney Drain MD, MD 10/08/2018 1:05:30 PM This report has been signed electronically. Number of Addenda: 0

## 2018-10-08 NOTE — Progress Notes (Signed)
  Echocardiogram 2D Echocardiogram has been performed.  Hannah Roy 10/08/2018, 8:57 AM

## 2018-10-08 NOTE — Consult Note (Signed)
Chief Complaint: Patient was seen in consultation today for  Chief Complaint  Patient presents with  . Blood In Stools   at the request of Dr. Barney Drain  Referring Physician(s): Dr. Barney Drain  Patient Status: [facility] - In-pt  History of Present Illness: Hannah Roy is a 78 y.o. female  with medical history significant forpersistent atrial fibrillation,rheumatic valvular disease,and hypertension who recently underwent open heart surgery on 09/05/2018 for AVR with bioprosthetic tissue valve, MVR with bioprosthetic tissue valve, tricuspid valve repair by annuloplasty, maze procedure, and repair of ascending thoracic aortic aneurysm. She was discharged on Coumadin for anticoagulation.   Patient presented to the ED on 10/07/18 feeling fatigued and short of breath. She had been noticing dark black stools, but thought due to iron. She had been seen by her cardiologist on 10/07/18 and lab work showed hemoglobin 7.4 compared to 9.8 on 09/12/18.   Upper endoscopy showed a large duodenal ulcer and active bleeding.  Epinephrine successfully injected but persistent oozing.  Repeat H&H following 1 unit transfusion is 6.7/20.8.  Patient transferred to [facility] for angiogram and embolization.   Pt scared but denies pain or SOB.   Past Medical History:  Diagnosis Date  . Aortic atherosclerosis ([place])   . Aortic insufficiency, rheumatic   . 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 Atrial fibrillation ([place]) 05/10/2018  . Colon polyps   . Essential hypertension, benign   . History of seasonal allergies   . Hypertension   . Hypothyroidism   . Mitral stenosis with regurgitation, rheumatic   . Osteopenia 2006  . Persistent atrial fibrillation   . 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   21 mm [facility] Ease stented bovine pericardial tissue valve  . S/P ascending aortic aneurysm repair 09/05/2018   28 mm Hemashield platinum supracoronary straight graft  . S/P Maze  operation for atrial fibrillation 09/05/2018   Complete bilateral atrial lesion set using bipolar radiofrequency and cryothermy ablation with clipping of LA appendage  . 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   29 mm [facility] Mitral stented bovine pericardial tissue valve  . 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 Tricuspid regurgitation     Past Surgical History:  Procedure Laterality Date  . AORTIC VALVE REPLACEMENT N/A 09/05/2018   Procedure: AORTIC VALVE REPLACEMENT (AVR) USING MAGNA EASE 21MM AOTIC BIOPROSTHESIS VALVE;  Surgeon: Rexene Alberts, MD;  Location: [place];  Service: Open Heart Surgery;  Laterality: N/A;  . APPENDECTOMY    . CLIPPING OF ATRIAL APPENDAGE  09/05/2018   Procedure: Clipping Of Atrial Appendage;  Surgeon: Rexene Alberts, MD;  Location: [place];  Service: Open Heart Surgery;;  . COLONOSCOPY  03/23/2011   repeat in 5 years,Procedure: COLONOSCOPY;  Surgeon: Rogene Houston, MD;  [facility];  Service: Endoscopy;  Laterality: N/A;  1:00  . COLONOSCOPY N/A 11/09/2016   Procedure: COLONOSCOPY;  Surgeon: Rogene Houston, MD;  [facility];  Service: Endoscopy;  Laterality: N/A;  1030  . MAZE N/A 09/05/2018   Procedure: MAZE;  Surgeon: Rexene Alberts, MD;  Location: [place];  Service: Open Heart Surgery;  Laterality: N/A;  . MITRAL VALVE REPLACEMENT N/A 09/05/2018   Procedure: MITRAL VALVE (MV) REPLACEMENT USING MAGNA MITRAL EASE 29MM BIOPROSTHESIS VALVE;  Surgeon: Rexene Alberts, MD;  Location: [place];  Service: Open Heart Surgery;  Laterality: N/A;  . REPLACEMENT ASCENDING AORTA  N/A 09/05/2018   Procedure: SUPRACORONARY STRAIGHT GRAFT REPLACEMENT OF ASCENDING AORTA;  Surgeon: Rexene Alberts, MD;  Location: [place];  Service: Open Heart Surgery;  Laterality: N/A;  . RIGHT/LEFT HEART CATH AND CORONARY ANGIOGRAPHY N/A 08/29/2018   Procedure: RIGHT/LEFT HEART CATH AND CORONARY ANGIOGRAPHY;   Surgeon: Lorretta Harp, MD;  Location: [place] [facility];  Service: Cardiovascular;  Laterality: N/A;  . TEE WITHOUT CARDIOVERSION N/A 08/29/2018   Procedure: TRANSESOPHAGEAL ECHOCARDIOGRAM (TEE);  Surgeon: Skeet Latch, MD;  Location: [place];  Service: Cardiovascular;  Laterality: N/A;  . TRICUSPID VALVE REPLACEMENT N/A 09/05/2018   Procedure: TRICUSPID VALVE REPAIR USING EDWARDS MC3 TRICUSPID ANNULOPLASTY RING SIZE T28;  Surgeon: Rexene Alberts, MD;  Location: [place];  Service: Open Heart Surgery;  Laterality: N/A;  . TUBAL LIGATION      Allergies: Bee venom, Boniva [ibandronic acid], Lisinopril, and Latex  Medications: Prior to Admission medications   Medication Sig Start Date End Date Taking? Authorizing Provider  aspirin EC 81 MG EC tablet Take 1 tablet (81 mg total) by mouth daily. 09/14/18  Yes Roddenberry, Myron G, PA-C  bisacodyl (DULCOLAX) 5 MG EC tablet Take 5 mg by mouth daily.   Yes [provider]  ferrous [id number] C-folic acid (TRINSICON / FOLTRIN) capsule Take 1 capsule by mouth daily with breakfast. 09/14/18  Yes Roddenberry, Myron G, PA-C  ondansetron (ZOFRAN) 4 MG tablet Take 1 tablet (4 mg total) by mouth every 8 (eight) hours as needed for nausea or vomiting. 09/23/18 09/23/19 Yes Gold, Wayne E, PA-C  traMADol (ULTRAM) 50 MG tablet Take 1 tablet (50 mg total) by mouth every 4 (four) hours as needed for moderate pain. 09/19/18  Yes Rexene Alberts, MD  warfarin (COUMADIN) 2.5 MG tablet Increase coumadin to 1 1/2 tablets daily Patient taking differently: Take 3.75 mg by mouth every evening.  10/01/18  Yes Herminio Commons, MD     Family History  Problem Relation Age of Onset  . Colon cancer Daughter        about 87    Social History   Socioeconomic History  . Marital status: Married    Spouse name: Not on file  . Number of children: Not on file  . Years of education: Not on file  . Highest education level: Not on file   Occupational History  . Not on file  Social Needs  . Financial resource strain: Not very hard  . Food insecurity    Worry: Never true    Inability: Never true  . Transportation needs    Medical: No    Non-medical: No  Tobacco Use  . Smoking status: Former Smoker    Packs/day: 0.50    Years: 10.00    Pack years: 5.00  . Smokeless tobacco: Never Used  Substance and Sexual Activity  . Alcohol use: No  . Drug use: No  . Sexual activity: Not on file  Lifestyle  . Physical activity    Days per week: 3 days    Minutes per session: 20 min  . Stress: Only a little  Relationships  . Social connections    Talks on phone: More than three times a week    Gets together: Three times a week    Attends religious service: 1 to 4 times per year    Active member of club or organization: No    Attends meetings of clubs or organizations: Never    Relationship status: Married  Other Topics Concern  .  Not on file  Social History Narrative  . Not on file     Review of Systems: A 12 point ROS discussed and pertinent positives are indicated in the HPI above.  All other systems are negative.  Review of Systems  Vital Signs: BP 100/69   Pulse 93   Temp 98.5 F (36.9 C) (Oral)   Resp (!) 21   SpO2 100%   Physical Exam Vitals signs reviewed.  Constitutional:      General: She is not in acute distress.    Appearance: She is normal weight.  HENT:     Head: Normocephalic and atraumatic.     Mouth/Throat:     Mouth: Mucous membranes are pale and dry.  Cardiovascular:     Rate and Rhythm: Normal rate and regular rhythm.     Pulses:          Femoral pulses are 2+ on the right side and 2+ on the left side.      Dorsalis pedis pulses are 1+ on the right side and 1+ on the left side.  Pulmonary:     Effort: Pulmonary effort is normal.  Abdominal:     General: Abdomen is flat.     Palpations: There is no mass.  Skin:    Coloration: Skin is pale.  Neurological:     Mental Status: She  is alert.  Psychiatric:        Mood and Affect: Mood and affect normal.     Imaging: No results found.  Labs:  CBC: Recent Labs    10/07/18 1543 10/08/18 0421 10/08/18 1306 10/08/18 1447  WBC 7.0 6.2 8.4 14.7*  HGB 7.6* 6.6* 7.2* 6.7*  HCT 24.5* 20.8* 22.5* 20.8*  PLT 306 216 192 173    COAGS: Recent Labs    09/02/18 0452 09/03/18 0443 09/03/18 1403 09/05/18 1618  10/01/18 1436 10/07/18 1543 10/08/18 0421 10/08/18 1447  INR 1.2  --   --  2.0*   < > 1.3* 1.7* 2.2* 1.7*  APTT 93* 110* 70* 49*  --   --   --   --   --    < > = values in this interval not displayed.    BMP: Recent Labs    09/12/18 0231 10/07/18 1343 10/07/18 1543 10/08/18 0421  NA 138 139 141 141  K 3.9 4.7 4.3 4.0  CL 105 105 107 112*  CO2 25 24 23  21*  GLUCOSE 104* 109* 106* 101*  BUN 16 31* 33* 38*  CALCIUM 8.4* 9.0 9.2 7.9*  CREATININE 0.80 0.62 0.88 0.46  GFRNONAA >60 >60 >60 >60  GFRAA >60 >60 >60 >60    LIVER FUNCTION TESTS: Recent Labs    05/10/18 1239 09/02/18 0452 09/05/18 0344 10/07/18 1543  BILITOT 0.9 1.0 0.6 0.4  AST 22 15 36 17  ALT 33 20 39 15  ALKPHOS 74 64 57 74  PROT 7.7 6.3* 6.4* 7.5  ALBUMIN 4.3 3.5 3.8 4.0    TUMOR MARKERS: No results for input(s): AFPTM, CEA, CA199, CHROMGRNA in the last 8760 hours.  Assessment and Plan:  Actively bleeding duodenal ulcer in the setting of nonreversible anticoagulation status post partially effective endoscopic therapy.  Patient continues to bleed and is requiring multiunit transfusion.  She presents as a transfer from [facility] for emergent mesenteric angiogram and coil embolization.  Risks, benefits and alternatives explained to the patient.  She understands and desires to proceed.  1.  Proceed with selective visceral angiography  and coil embolization.  Thank you for this interesting consult.  I greatly enjoyed meeting Hannah Roy and look forward to participating in their care.  A copy of this report  was sent to the requesting provider on this date.  Electronically Signed: Jacqulynn Cadet, MD 10/08/2018, 7:28 PM   I spent a total of 20 Minutes in face to face in clinical consultation, greater than 50% of which was counseling/coordinating care for acute upper GI bleeding requiring multi-unit transfusion.

## 2018-10-08 NOTE — Consult Note (Addendum)
Cardiology Consult    Patient ID: Hannah Roy; [id number]; 1941/04/07   Admit date: 10/07/2018 Date of Consult: 10/08/2018  Primary Care Provider: Kathyrn Drown, MD Primary Cardiologist: Kate Sable, MD   Patient Profile    Hannah Roy is a 78 y.o. 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 Posey Pronto.   History of Present Illness    Hannah Roy was most recently admitted to [facility] from 5/23 - 09/13/2018 for evaluation of dizziness and worsening dyspnea. Was in atrial fibrillation with RVR during admission and continued on Amiodarone 200mg  BID along with Cardizem CD 240mg  daily and Lopressor 25mg  BID on admission. Eliquis was held and she was started on Heparin in anticipation of surgical procedures. Was evaluated by CT Surgery and underwent aortic valve replacement with a bioprosthetic tissue valve, mitral valve replacement with a bovine bioprosthetic tissue valve, tricuspid valve repair with ring annuloplasty, Maze procedure, and 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 Roxy Manns. 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 Was also having intermittent AVF. Her prior AV nodal blocking agents were discontinued at discharge. Eliquis was transitioned to Coumadin in regards to her anticoagulation.   She had a follow-up visit with Ermalinda Barrios, PA-C on 09/23/2018 and was walking multiple times per day without symptoms. HR was stable in the 80's to 90's at rest,  peaking in the 110's with activity. A Zio monitor was arranged to follow her HR. Was evaluated in the office on 6/29 and was having episodes of elevated rates, in the 150's with activity. Also reported worsening nausea and black tarry stools, therefore CBC was obtained and showed her Hgb had declined to 7.4 and ED evaluation was recommended.   In talking with the patient, she reports worsening fatigue for the past several weeks. Also noted black, tarry stools which she initially thought was due to her iron supplementation. Denies any recent exertional chest pain or dyspnea. Does experience intermittent palpitations but denies any associated symptoms.   Initial labs showed WBC 7.0, Hgb 7.6, platelets 306, Na+ 141, K+ 4.3, and creatinine 0.88. INR 1.7. COVID testing negative. EKG on admission has baseline artifact but appears most consistent with NSR, HR 98, and known RBBB. No acute ST changes. Received 1 unit pRBC's and Hgb this AM at 6.6. Scheduled to receive another unit pRBC's. CXR showed persistent left lower lobe atelectasis with no acute findings.   Past Medical History:  Diagnosis Date  . Aortic atherosclerosis ([place])   . Aortic insufficiency, rheumatic   . 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 Atrial fibrillation ([place]) 05/10/2018  . Colon polyps   . Essential hypertension, benign   . History of seasonal allergies   . Hypertension   . Hypothyroidism   . Mitral stenosis with regurgitation, rheumatic   . Osteopenia 2006  . Persistent atrial fibrillation   . 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   21 mm [facility] Ease stented bovine pericardial tissue valve  . S/P  ascending aortic aneurysm repair 09/05/2018   28 mm Hemashield platinum supracoronary straight graft  . S/P Maze operation for atrial fibrillation 09/05/2018   Complete bilateral atrial lesion set using bipolar radiofrequency and cryothermy ablation with clipping of LA appendage  . S/P mitral valve  replacement with bioprosthetic valve 09/05/2018   29 mm [facility] Mitral stented bovine pericardial tissue valve  . 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 Tricuspid regurgitation     Past Surgical History:  Procedure Laterality Date  . AORTIC VALVE REPLACEMENT N/A 09/05/2018   Procedure: AORTIC VALVE REPLACEMENT (AVR) USING MAGNA EASE 21MM AOTIC BIOPROSTHESIS VALVE;  Surgeon: Rexene Alberts, MD;  Location: [place];  Service: Open Heart Surgery;  Laterality: N/A;  . APPENDECTOMY    . CLIPPING OF ATRIAL APPENDAGE  09/05/2018   Procedure: Clipping Of Atrial Appendage;  Surgeon: Rexene Alberts, MD;  Location: [place];  Service: Open Heart Surgery;;  . COLONOSCOPY  03/23/2011   repeat in 5 years,Procedure: COLONOSCOPY;  Surgeon: Rogene Houston, MD;  [facility];  Service: Endoscopy;  Laterality: N/A;  1:00  . COLONOSCOPY N/A 11/09/2016   Procedure: COLONOSCOPY;  Surgeon: Rogene Houston, MD;  [facility];  Service: Endoscopy;  Laterality: N/A;  1030  . MAZE N/A 09/05/2018   Procedure: MAZE;  Surgeon: Rexene Alberts, MD;  Location: [place];  Service: Open Heart Surgery;  Laterality: N/A;  . MITRAL VALVE REPLACEMENT N/A 09/05/2018   Procedure: MITRAL VALVE (MV) REPLACEMENT USING MAGNA MITRAL EASE 29MM BIOPROSTHESIS VALVE;  Surgeon: Rexene Alberts, MD;  Location: [place];  Service: Open Heart Surgery;  Laterality: N/A;  . REPLACEMENT ASCENDING AORTA N/A 09/05/2018   Procedure: SUPRACORONARY STRAIGHT GRAFT REPLACEMENT OF ASCENDING AORTA;  Surgeon: Rexene Alberts, MD;  Location: [place];  Service: Open Heart Surgery;  Laterality: N/A;  . RIGHT/LEFT HEART CATH AND CORONARY ANGIOGRAPHY N/A 08/29/2018   Procedure: RIGHT/LEFT HEART CATH AND CORONARY ANGIOGRAPHY;  Surgeon: Lorretta Harp, MD;  Location: [place] [facility];  Service: Cardiovascular;  Laterality: N/A;  . TEE WITHOUT CARDIOVERSION N/A 08/29/2018   Procedure: TRANSESOPHAGEAL  ECHOCARDIOGRAM (TEE);  Surgeon: Skeet Latch, MD;  Location: [place];  Service: Cardiovascular;  Laterality: N/A;  . TRICUSPID VALVE REPLACEMENT N/A 09/05/2018   Procedure: TRICUSPID VALVE REPAIR USING EDWARDS MC3 TRICUSPID ANNULOPLASTY RING SIZE T28;  Surgeon: Rexene Alberts, MD;  Location: [place];  Service: Open Heart Surgery;  Laterality: N/A;  . TUBAL LIGATION       Home Medications:  Prior to Admission medications   Medication Sig Start Date End Date Taking? Authorizing Provider  aspirin EC 81 MG EC tablet Take 1 tablet (81 mg total) by mouth daily. 09/14/18  Yes Roddenberry, Myron G, PA-C  bisacodyl (DULCOLAX) 5 MG EC tablet Take 5 mg by mouth daily.   Yes [provider]  ferrous [id number] C-folic acid (TRINSICON / FOLTRIN) capsule Take 1 capsule by mouth daily with breakfast. 09/14/18  Yes Roddenberry, Myron G, PA-C  ondansetron (ZOFRAN) 4 MG tablet Take 1 tablet (4 mg total) by mouth every 8 (eight) hours as needed for nausea or vomiting. 09/23/18 09/23/19 Yes Gold, Wayne E, PA-C  traMADol (ULTRAM) 50 MG tablet Take 1 tablet (50 mg total) by mouth every 4 (four) hours as needed for moderate pain. 09/19/18  Yes Rexene Alberts, MD  warfarin (COUMADIN) 2.5 MG tablet Increase coumadin to 1 1/2 tablets  daily Patient taking differently: Take 3.75 mg by mouth every evening.  10/01/18  Yes Herminio Commons, MD    Inpatient Medications: Scheduled Meds: . sodium chloride   Intravenous Once  . pantoprazole  40 mg Oral Daily  . sodium chloride flush  3 mL Intravenous Q12H   Continuous Infusions: . sodium chloride     PRN Meds: acetaminophen **OR** acetaminophen  Allergies:    Allergies  Allergen Reactions  . Bee Venom Anaphylaxis  . Boniva [Ibandronic Acid] Nausea And Vomiting and Other (See Comments)    Upset stomach, flu like symptoms   . Lisinopril Cough  . Latex Rash    Social History:   Social History   Socioeconomic History  . Marital status:  Married    Spouse name: Not on file  . Number of children: Not on file  . Years of education: Not on file  . Highest education level: Not on file  Occupational History  . Not on file  Social Needs  . Financial resource strain: Not very hard  . Food insecurity    Worry: Never true    Inability: Never true  . Transportation needs    Medical: No    Non-medical: No  Tobacco Use  . Smoking status: Former Smoker    Packs/day: 0.50    Years: 10.00    Pack years: 5.00  . Smokeless tobacco: Never Used  Substance and Sexual Activity  . Alcohol use: No  . Drug use: No  . Sexual activity: Not on file  Lifestyle  . Physical activity    Days per week: 3 days    Minutes per session: 20 min  . Stress: Only a little  Relationships  . Social connections    Talks on phone: More than three times a week    Gets together: Three times a week    Attends religious service: 1 to 4 times per year    Active member of club or organization: No    Attends meetings of clubs or organizations: Never    Relationship status: Married  . Intimate partner violence    Fear of current or ex partner: No    Emotionally abused: No    Physically abused: No    Forced sexual activity: No  Other Topics Concern  . Not on file  Social History Narrative  . Not on file     Family History:    Family History  Problem Relation Age of Onset  . Colon cancer Daughter       Review of Systems    General:  No chills, fever, night sweats or weight changes. Positive for fatigue.  Cardiovascular:  No chest pain, dyspnea on exertion, edema, orthopnea, palpitations, paroxysmal nocturnal dyspnea. Dermatological: No rash, lesions/masses Respiratory: No cough, dyspnea Urologic: No hematuria, dysuria Abdominal:   No nausea, vomiting, diarrhea, bright red blood per rectum, or hematemesis. Positive for melena.  Neurologic:  No visual changes, wkns, changes in mental status. All other systems reviewed and are otherwise  negative except as noted above.  Physical Exam/Data    Vitals:   10/08/18 0649 10/08/18 0657 10/08/18 0700 10/08/18 0717  BP:  (!) 80/63 (!) 80/63 (!) 87/64  Pulse:  91 90 89  Resp:  20 19 (!) 25  Temp: 98.1 F (36.7 C) 98.1 F (36.7 C)  98.7 F (37.1 C)  TempSrc: Oral Oral  Oral  SpO2:  100% 100% 99%    Intake/Output Summary (Last 24 hours) at 10/08/2018 0803  Last data filed at 10/08/2018 0649 Gross per 24 hour  Intake 905.92 ml  Output -  Net 905.92 ml   There were no vitals filed for this visit. There is no height or weight on file to calculate BMI.   General: Pleasant, female appearing in NAD Psych: Normal affect. Neuro: Alert and oriented X 3. Moves all extremities spontaneously. HEENT: Normal  Neck: Supple without bruits or JVD. Lungs:  Resp regular and unlabored, CTA without wheezing or rales. Heart: RRR no s3, s4, or murmurs. Abdomen: Soft, non-tender, non-distended, BS + x 4.  Extremities: No clubbing, cyanosis or edema. DP/PT/Radials 2+ and equal bilaterally.   EKG:  The EKG was personally reviewed and demonstrates: Baseline artifact but appears most consistent with NSR, HR 98, and known RBBB.   Telemetry:  Telemetry was personally reviewed and demonstrates: NSR, HR in 70's to 90's.    Labs/Studies     Relevant CV Studies:  Cardiac Catheterization: 08/2018  Prox LAD lesion is 40% stenosed.  Mid Cx lesion is 40% stenosed.  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    History obtained from chart review.  Hannah Roy has rheumatic heart disease with moderate Hannah, severe AI and normal LV function.  She did have A. fib in the past and was symptomatic.  She is currently in sinus rhythm and was on Eliquis which was discontinued 3 days ago.  She presents now for right and left heart cath to define her anatomy and physiology in anticipation for multivalve surgery by Dr. Roxy Manns.  IMPRESSION: Hannah Roy has noncritical  CAD with a high V wave and filling pressures and at least a 9 to 10 mm transmitral gradient.  The Swan-Ganz catheter was removed.  The radial sheath was removed and a TR band was placed on the right wrist to achieve patent hemostasis.  The patient left lab stable condition.  She can be discharged home today as an outpatient and restart Eliquis oral anticoagulation.  We will arrange outpatient follow-up and evaluation with Dr. Roxy Manns to determine suitability for multivalve replacement.  TEE: 08/2018 IMPRESSIONS   1. The left ventricle has normal systolic function, with an ejection fraction of 55-60%. The cavity size was normal.  2. No evidence of a thrombus present in the left atrial appendage.  3. The mitral valve is rheumatic. Moderate thickening of the anterior mitral valve leaflet, with moderately decreased mobility. Moderate mitral valve stenosis.  4. Hockey stick-like motion of the anterior mitral valve leaflet consistent with Rheumatic mitral valve disease.  5. Tricuspid valve regurgitation is moderate.  6. The aortic valve is tricuspid Mild thickening of the aortic valve Mild calcification of the aortic valve. Aortic valve regurgitation is moderate by color flow Doppler. Mild stenosis of the aortic valve.  7. Aortic valve leaflet motion mildly restricted. Aortic valve leaflets do not fully coapt, resulting in central aortic regurgitation. Aortic regurgitaiton pressure half time 397 Hannah, consistent with moderate aortic regurgitation.  8. Pulmonic valve regurgitation is mild by color flow Doppler.   Laboratory Data:  Chemistry Recent Labs  Lab 10/07/18 1343 10/07/18 1543 10/08/18 0421  NA 139 141 141  K 4.7 4.3 4.0  CL 105 107 112*  CO2 24 23 21*  GLUCOSE 109* 106* 101*  BUN 31* 33* 38*  CREATININE 0.62 0.88 0.46  CALCIUM 9.0 9.2 7.9*  GFRNONAA >60 >60 >60  GFRAA >60 >60 >60  ANIONGAP 10 11 8     Recent Labs  Lab 10/07/18  1543  PROT 7.5  ALBUMIN 4.0  AST 17  ALT 15   ALKPHOS 74  BILITOT 0.4   Hematology Recent Labs  Lab 10/07/18 1343 10/07/18 1543 10/08/18 0421  WBC 7.1 7.0 6.2  RBC 2.40* 2.45* 2.20*  HGB 7.4* 7.6* 6.6*  HCT 24.2* 24.5* 20.8*  MCV 100.8* 100.0 94.5  MCH 30.8 31.0 30.0  MCHC 30.6 31.0 31.7  RDW 14.6 14.9 15.7*  PLT 276 306 216   Cardiac EnzymesNo results for input(s): TROPONINI in the last 168 hours. No results for input(s): TROPIPOC in the last 168 hours.  BNPNo results for input(s): BNP, PROBNP in the last 168 hours.  DDimer No results for input(s): DDIMER in the last 168 hours.  Radiology/Studies:  No results found.   Assessment & Plan    1. Rheumatic Valvular Heart Disease - she is s/p aortic valve replacement with a bioprosthetic tissue valve, mitral valve replacement with a bovine bioprosthetic tissue valve, tricuspid valve repair with ring annuloplasty, Maze procedure, and 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 Roxy Manns.  - repeat echocardiogram performed this morning with report pending at this time.   2. 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  - outpatient monitor was placed but has not yet resulted and no significant arrhythmias noted on the [employer] website but limited data is available.  - initial EKG has artifact but appears most consistent with NSR and she appears to have been in NSR since admission. Has a high-risk of recurrent atrial fibrillation, especially in the setting of her significant anemia. Will review with Dr. Domenic Polite in regards to restarting Lopressor at a lower dose of 12.5mg  BID this admission and following on telemetry. Would be hesitant to initiate at this time given hypotension (BP at 88/64 on most recent check).  - Coumadin held on admission. INR 2.2  this AM with plans to restart Heparin once INR less than 2.   3. Anemia/ Melena - presented with worsening fatigue and melena for the past few weeks. Hgb 7.6 on admission and received 1 unit pRBC's and Hgb this AM at 6.6. Scheduled to receive another unit pRBC's.  - GI has been consulted with plans for likely EGD this admission.    For questions or updates, please contact [place] Please consult [email or web address] for contact info under Cardiology/STEMI.  Arna Medici, PA-C 10/08/2018, 8:03 AM Pager: [phone]   Attending note:  Patient seen and examined.  I reviewed her records and discussed the case with Hannah. Ahmed Prima PA-C.  Hannah Roy is currently admitted to the hospital in light of recently documented severe anemia with suspected GI bleed, description of melena provided although she has also had dark stools on iron supplements.  Hemoglobin this morning down to 6.6 and she is currently receiving PRBCs.  She has a history of rheumatic heart disease and just recently underwent placement of a bioprosthetic AVR, bioprosthetic MVR, and a tricuspid ring annuloplasty with Maze procedure and repair of ascending thoracic aortic aneurysm in late May.  In addition she has a history of paroxysmal atrial fibrillation.  She has been on Coumadin although this is currently held.  On examination she appears comfortable this morning.  She is afebrile.  At present she is in sinus rhythm by telemetry which I personally  reviewed, heart rate in the 80s.  Systolic blood pressure is running in the 90s to 100 range most recently, had been lower overnight.  Thoracic incision appears to be healing well, no induration or drainage.  Lungs are clear.  2/6 systolic murmur noted.  Lab work shows potassium 4.0, BUN 38, creatinine 0.46, hemoglobin 6.6 down from 7.6, platelets 216, fecal occult positive.  I personally reviewed her recent ECG which shows sinus rhythm with right bundle branch block. Follow-up  echocardiogram is pending at this time.  We will continue to follow from a cardiac perspective.  Would ultimately anticipate a trial of low-dose Lopressor 12.5 mg twice daily as she will very likely continue to experience paroxysmal atrial fibrillation.  She is presently in sinus rhythm with low normal blood pressure so would hold off on adding new medication as yet.  She also has a history of postoperative second-degree type II heart block and was in fact wearing an outpatient cardiac monitor for further assessment.  She has not experienced any syncope and so far in hospital telemetry shows no heart block.  Otherwise, would hold Coumadin at this time, she does not necessarily require a heparin bridge particularly until bleeding process is better understood (I spoke with Dr. Roxy Manns).  She is in the process of a GI consultation.  She should be able to undergo endoscopy from a cardiac perspective.  Hopefully there will be something that can be readily corrected since ideally we would plan to continue anticoagulation longer-term.  Satira Sark, M.D., F.A.C.C.

## 2018-10-08 NOTE — Consult Note (Signed)
Referring Provider: [facility] Primary Care Physician:  Kathyrn Drown, MD Primary Gastroenterologist:  Dr. Laural Golden  Date of Admission: 10/08/18 Date of Consultation: 10/07/18  Reason for Consultation:  Melena, acute blood loss anemia  HPI:   Hannah Roy is a 78 y.o. year old female with medical history significant for persistent atrial fibrillation, rheumatic valvular disease, and hypertension who was recently hospitalized from 08/31/2018-09/13/2018.  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  She was discharged on Coumadin for anticoagulation.  Her antiarrhythmic/rate control and antihypertensive medications were discontinued due to bradycardia/orthostasis.    Patient presented to the ED on 10/07/18 feeling fatigued and short of breath. She had been noticing dark black stools, but thought due to iron. She had been seen by her cardiologist on 10/07/18 and lab work showed hemoglobin 7.4 compared to 9.8 on 09/12/18.   Labs are notable for hemoglobin 7.6, WBC 7.0, platelets 306,000, sodium 141, potassium 4.3, BUN 33, creatinine 0.88, INR 1.7.  FOBT was positive. ED physician discussed case with cardiology who recommended continuing anticoagulation due to recent cardiac surgery, repeat echo on 6/30, and cardiology consult. Patient was ordered 1 unit PRBCs in ED.   Today she explains that after her cardiac surgery, she was discharged on 6/5 and felt well for about 1 week. Subsequently, she began to be constipation and lose her appetite. Took stool softeners and laxatives and had solid black stool; however, she is on iron and states she has been having black stools with iron. Continued taking stool softener and was having BMs daily thereafter. She has continued "forcing" herself to eat although she has no appetite.  About 1 week ago she started getting fatigued and short of breath with walking. Admits to some difficulty swallowing and horseness since her surgery which has continued to improve. She is careful to chew her foods well and hasn't experienced any foods getting stuck. Continues to take pills with applesauce. Takes her time with liquids. Admits to a little nausea yesterday, but none prior. No vomiting, reflux symptoms, abdominal pain, chest pain, or shortness of breath at rest. Denies lightheadedness and dizziness.  She has been on 81 mg aspirin daily since discharge on 6/5. No other NSAIDs. No alcohol, or illicit drugs. Former smoker.     Last colonoscopy in 2018 with two 5-6 mm polyps in the distal sigmoid colon and in the cecum. External hemorrhoids. Otherwise normal.   Past Medical History:  Diagnosis Date  . Aortic atherosclerosis ([place])   . Aortic insufficiency, rheumatic   . 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 Atrial fibrillation ([place]) 05/10/2018  . Colon polyps   . Essential hypertension, benign   . History of seasonal allergies   . Hypertension   . Hypothyroidism   . Mitral stenosis with regurgitation, rheumatic   . Osteopenia 2006  . Persistent atrial fibrillation   . 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   21 mm [facility] Ease stented bovine pericardial tissue valve  . S/P ascending aortic aneurysm repair 09/05/2018   28 mm Hemashield platinum supracoronary straight graft  . S/P Maze operation for atrial fibrillation 09/05/2018   Complete bilateral atrial lesion set using bipolar radiofrequency and cryothermy ablation with clipping of LA appendage  . 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   29 mm [facility] Mitral stented bovine pericardial  tissue valve  . 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 Tricuspid regurgitation     Past Surgical History:  Procedure Laterality Date  . AORTIC  VALVE REPLACEMENT N/A 09/05/2018   Procedure: AORTIC VALVE REPLACEMENT (AVR) USING MAGNA EASE 21MM AOTIC BIOPROSTHESIS VALVE;  Surgeon: Rexene Alberts, MD;  Location: [place];  Service: Open Heart Surgery;  Laterality: N/A;  . APPENDECTOMY    . CLIPPING OF ATRIAL APPENDAGE  09/05/2018   Procedure: Clipping Of Atrial Appendage;  Surgeon: Rexene Alberts, MD;  Location: [place];  Service: Open Heart Surgery;;  . COLONOSCOPY  03/23/2011   repeat in 5 years,Procedure: COLONOSCOPY;  Surgeon: Rogene Houston, MD;  [facility];  Service: Endoscopy;  Laterality: N/A;  1:00  . COLONOSCOPY N/A 11/09/2016   Procedure: COLONOSCOPY;  Surgeon: Rogene Houston, MD;  [facility];  Service: Endoscopy;  Laterality: N/A;  1030  . MAZE N/A 09/05/2018   Procedure: MAZE;  Surgeon: Rexene Alberts, MD;  Location: [place];  Service: Open Heart Surgery;  Laterality: N/A;  . MITRAL VALVE REPLACEMENT N/A 09/05/2018   Procedure: MITRAL VALVE (MV) REPLACEMENT USING MAGNA MITRAL EASE 29MM BIOPROSTHESIS VALVE;  Surgeon: Rexene Alberts, MD;  Location: [place];  Service: Open Heart Surgery;  Laterality: N/A;  . REPLACEMENT ASCENDING AORTA N/A 09/05/2018   Procedure: SUPRACORONARY STRAIGHT GRAFT REPLACEMENT OF ASCENDING AORTA;  Surgeon: Rexene Alberts, MD;  Location: [place];  Service: Open Heart Surgery;  Laterality: N/A;  . RIGHT/LEFT HEART CATH AND CORONARY ANGIOGRAPHY N/A 08/29/2018   Procedure: RIGHT/LEFT HEART CATH AND CORONARY ANGIOGRAPHY;  Surgeon: Lorretta Harp, MD;  Location: [place] [facility];  Service: Cardiovascular;  Laterality: N/A;  . TEE WITHOUT CARDIOVERSION N/A 08/29/2018   Procedure: TRANSESOPHAGEAL ECHOCARDIOGRAM (TEE);  Surgeon: Skeet Latch, MD;  Location: [place];  Service: Cardiovascular;  Laterality: N/A;  . TRICUSPID VALVE REPLACEMENT N/A 09/05/2018   Procedure: TRICUSPID VALVE REPAIR USING EDWARDS MC3 TRICUSPID ANNULOPLASTY RING SIZE T28;  Surgeon: Rexene Alberts, MD;   Location: [place];  Service: Open Heart Surgery;  Laterality: N/A;  . TUBAL LIGATION      Prior to Admission medications   Medication Sig Start Date End Date Taking? Authorizing Provider  aspirin EC 81 MG EC tablet Take 1 tablet (81 mg total) by mouth daily. 09/14/18  Yes Roddenberry, Myron G, PA-C  bisacodyl (DULCOLAX) 5 MG EC tablet Take 5 mg by mouth daily.   Yes [provider]  ferrous [id number] C-folic acid (TRINSICON / FOLTRIN) capsule Take 1 capsule by mouth daily with breakfast. 09/14/18  Yes Roddenberry, Myron G, PA-C  ondansetron (ZOFRAN) 4 MG tablet Take 1 tablet (4 mg total) by mouth every 8 (eight) hours as needed for nausea or vomiting. 09/23/18 09/23/19 Yes Gold, Wayne E, PA-C  traMADol (ULTRAM) 50 MG tablet Take 1 tablet (50 mg total) by mouth every 4 (four) hours as needed for moderate pain. 09/19/18  Yes Rexene Alberts, MD  warfarin (COUMADIN) 2.5 MG tablet Increase coumadin to 1 1/2 tablets daily Patient taking differently: Take 3.75 mg by mouth every evening.  10/01/18  Yes Herminio Commons, MD    Current Facility-Administered Medications  Medication Dose Route Frequency Provider Last Rate Last Dose  . 0.9 %  sodium chloride infusion (Manually program via Guardrails IV Fluids)   Intravenous Once Oswald Hillock, MD      . 0.9 %  sodium chloride infusion  10 mL/hr Intravenous Once Idol, Almyra Free, PA-C      . acetaminophen (TYLENOL) tablet 650 mg  650 mg Oral Q6H PRN Lenore Cordia, MD       Or  . acetaminophen (TYLENOL) suppository 650 mg  650 mg Rectal Q6H PRN Lenore Cordia, MD      . pantoprazole (PROTONIX) EC tablet 40 mg  40 mg Oral Daily Patel, Vishal R, MD      . sodium chloride flush (NS) 0.9 % injection 3 mL  3 mL Intravenous Q12H Zada Finders R, MD   3 mL at 10/07/18 2152    Allergies as of 10/07/2018 - Review Complete 10/07/2018  Allergen Reaction Noted  . Bee venom Anaphylaxis 11/10/2010  . Boniva [ibandronic acid] Nausea And Vomiting and  Other (See Comments) 07/26/2012  . Lisinopril Cough 07/26/2012  . Latex Rash 11/10/2010    Family History  Problem Relation Age of Onset  . Colon cancer Daughter     Social History   Socioeconomic History  . Marital status: Married    Spouse name: Not on file  . Number of children: Not on file  . Years of education: Not on file  . Highest education level: Not on file  Occupational History  . Not on file  Social Needs  . Financial resource strain: Not very hard  . Food insecurity    Worry: Never true    Inability: Never true  . Transportation needs    Medical: No    Non-medical: No  Tobacco Use  . Smoking status: Former Smoker    Packs/day: 0.50    Years: 10.00    Pack years: 5.00  . Smokeless tobacco: Never Used  Substance and Sexual Activity  . Alcohol use: No  . Drug use: No  . Sexual activity: Not on file  Lifestyle  . Physical activity    Days per week: 3 days    Minutes per session: 20 min  . Stress: Only a little  Relationships  . Social connections    Talks on phone: More than three times a week    Gets together: Three times a week    Attends religious service: 1 to 4 times per year    Active member of club or organization: No    Attends meetings of clubs or organizations: Never    Relationship status: Married  . Intimate partner violence    Fear of current or ex partner: No    Emotionally abused: No    Physically abused: No    Forced sexual activity: No  Other Topics Concern  . Not on file  Social History Narrative  . Not on file    Review of Systems: Gen: Denies fever, chills CV: Denies chest pain, heart palpitations, syncope, edema  Resp: Denies shortness of breath with rest, cough GI: See HPI  GU : Denies urinary burning, urinary frequency, urinary incontinence, hematuria MS: Denies joint pain,swelling, cramping Derm: Denies rash, itching, dry skin Psych: Denies depression, anxiety Heme: Denies other bruising, bleeding  Physical  Exam: Vital signs in last 24 hours: Temp:  [97.1 F (36.2 C)-98.7 F (37.1 C)] 98.7 F (37.1 C) (06/30 0717) Pulse Rate:  [80-99] 89 (06/30 0717) Resp:  [16-26] 25 (06/30 0717) BP: (80-123)/(60-97) 87/64 (06/30 0717) SpO2:  [94 %-100 %] 99 % (06/30 0717) FiO2 (%):  [28 %] 28 % (06/30 0657) Weight:  [53.1 kg] 53.1 kg (06/29 1255) Last BM Date: 10/08/18 General:  Alert, pleasant and cooperative in NAD Head:  Normocephalic and atraumatic. Eyes:  Sclera clear, no icterus.   Conjunctiva pink. Ears:  Normal auditory acuity. Nose:  No deformity, discharge,  or lesions. Mouth:  No deformity or lesionsl. Lungs:  Clear throughout to auscultation.   No wheezes, crackles, or rhonchi. No acute distress. Heart:  Regular rate and rhythm; no murmurs, clicks, rubs,  or gallops. Scar along center of chesst from recent cardiac surgery. Abdomen:  Soft, nontender and nondistended. No masses, hepatosplenomegaly or hernias noted. Normal bowel sounds, without guarding, and without rebound.   Rectal:  Deferred Msk:  Symmetrical without gross deformities. Normal posture. Pulses:  Normal pulses noted. Extremities:  Without clubbing or edema. Neurologic:  Alert and  oriented x4;  grossly normal neurologically. Skin:  Intact without significant lesions or rashes. Psych:  Alert and cooperative. Normal mood and affect.  Intake/Output from previous day: 06/29 0701 - 06/30 0700 In: 905.9 [P.O.:480; I.V.:110.9; Blood:315] Out: -    Lab Results: Recent Labs    10/07/18 1343 10/07/18 1543 10/08/18 0421  WBC 7.1 7.0 6.2  HGB 7.4* 7.6* 6.6*  HCT 24.2* 24.5* 20.8*  PLT 276 306 216   BMET Recent Labs    10/07/18 1343 10/07/18 1543 10/08/18 0421  NA 139 141 141  K 4.7 4.3 4.0  CL 105 107 112*  CO2 24 23 21*  GLUCOSE 109* 106* 101*  BUN 31* 33* 38*  CREATININE 0.62 0.88 0.46  CALCIUM 9.0 9.2 7.9*   LFT Recent Labs    10/07/18 1543  PROT 7.5  ALBUMIN 4.0  AST 17  ALT 15  ALKPHOS 74   BILITOT 0.4   PT/INR Recent Labs    10/07/18 1543 10/08/18 0421  LABPROT 20.0* 23.7*  INR 1.7* 2.2*    Impression: 78 y.o. female s/p AVR with bioprosthetic tissue valve, MVR with bioprosthetic tissue valve, tricuspid valve repair by annuloplasty, maze procedure, and repair of ascending thoracic aortic aneurysm on 09/05/18 on Coumadin presenting with worsening fatigue and SOB. She was found to have hemoglobin of 7.6 on admission, down from 9.8 on 6/4. FOBT positive in ED. Melena reported since discharge on 6/5, but patient is on iron, so it is not clear how long she has has been bleeding. On aspirin 81 mg, no other NSAIDs. No alcohol, illicit drugs, or smoking.   Patient continues to have ongoing Melena today, but also requires anticoagulation in the setting of recent cardiac surgery. Coumadin on hold and heparin in use for anticoagulation. After one unit of blood on admission, patients hemoglobin continued to drop and was 6.6 this morning. INR 2.2. Heparin has been put on hold at this time and patient received one more unit of blood.   In the setting of daily aspirin, coumadin, this is likely upper GI bleed. Differentials include gastritis, PUD, AVMs. Will pursue EGD for further evaluation and management.   Plan: Prptonix 40 mg IV BID Heparin has been put on hold today as INR is 2.2.  Patient will need EGD for evaluation and possible endoscopic therapy. EGD will be today per Dr. Oneida Alar with propofol. The risks, benefits, and alternatives have been discussed in detail with patient. They have stated understanding and desire to proceed.  Transfuse as necessary.  Remain NPO at this time.      LOS: 0 days    10/08/2018, 8:01 AM   Aliene Altes, [facility]

## 2018-10-08 NOTE — Anesthesia Preprocedure Evaluation (Signed)
Anesthesia Evaluation  Patient identified by MRN, date of birth, ID band Patient awake    Reviewed: Allergy & Precautions, NPO status , Patient's Chart, lab work & pertinent test results  Airway Mallampati: II  TM Distance: >3 FB Neck ROM: Full    Dental no notable dental hx.    Pulmonary neg pulmonary ROS, former smoker,    Pulmonary exam normal breath sounds clear to auscultation       Cardiovascular Exercise Tolerance: Poor hypertension, negative cardio ROS Normal cardiovascular examII Rhythm:Regular Rate:Tachycardia     Neuro/Psych negative neurological ROS  negative psych ROS   GI/Hepatic negative GI ROS, Neg liver ROS,   Endo/Other  Hypothyroidism   Renal/GU negative Renal ROS  negative genitourinary   Musculoskeletal  (+) Arthritis , Osteoarthritis,    Abdominal   Peds negative pediatric ROS (+)  Hematology negative hematology ROS (+) anemia , Severe anemia s/p transfusions -last Hgb 6.6  INR up from coumadin after 09/05/2018 Cardiothoracic procedure. Unable to stop anticoagulation, considering heparin  Appreciate Cardiology note    Anesthesia Other Findings   Reproductive/Obstetrics negative OB ROS                             Anesthesia Physical Anesthesia Plan  ASA: IV and emergent  Anesthesia Plan: General   Post-op Pain Management:    Induction: Intravenous  PONV Risk Score and Plan: 3 and Propofol infusion, Treatment may vary due to age or medical condition and TIVA  Airway Management Planned: Simple Face Mask and Nasal Cannula  Additional Equipment:   Intra-op Plan:   Post-operative Plan: Extubation in OR  Informed Consent: I have reviewed the patients History and Physical, chart, labs and discussed the procedure including the risks, benefits and alternatives for the proposed anesthesia with the patient or authorized representative who has indicated his/her  understanding and acceptance.     Dental advisory given  Plan Discussed with: CRNA  Anesthesia Plan Comments: (Plan Full PPE use  Plan GA with GETA as needed  D/w pt possibility of post op ventilation -understood and WTP)        Anesthesia Quick Evaluation

## 2018-10-08 NOTE — Progress Notes (Signed)
Patient Demographics:    Hannah Roy, is a 78 y.o. female, DOB - 10-Oct-1940, PPJ:[id number]  Admit date - 10/07/2018   Admitting Physician Lenore Cordia, MD  Outpatient Primary MD for the patient is Kathyrn Drown, MD  LOS - 0   Chief Complaint  Patient presents with  . Blood In Stools        Subjective:    [name] today has no fevers, no emesis,  No chest pain, melanotic stools persist.... 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 Roxy Manns , dced from [facility] on 09/13/18, he admitted to [facility] on 10/07/2018 with dizziness, dyspnea and melena transferred back to [facility] on 10/08/2018 due to duodenal ulcer requiring interventional radiology to embolize  A/p 1) acute symptomatic blood loss anemia----due to actively bleeding duodenal ulcer --patient baseline hemoglobin used to be around 12 (09/02/18), after recent hospitalization she was discharged with a hemoglobin of 9.8 (09/12/2018) , this admission however after 2 units of packed cells repeat Hgb is 6.7--- Discussed with  Gi (Dr Oneida Alar  at [facility]) and Howie Ill at [facility]/ IR at [facility]- (Dr.Wagner)/Cardiology Dr. Domenic Polite (at [facility]) and CT Surgery Dr C. Roxy Manns at [facility]--- Patient currently receiving third unit of packed cells plan is to transfuse 4 units of packed cells, INR is down to 1.7.[name]..[name] As per Dr. Roxy Manns --no need for heparin bridge and okay to continue to hold anticoagulation patient has bioprosthetic bovine valve not mechanical valve---- --follow H&H and transfuse as needed....   2) ascending thoracic aorta aneurysm--- recently repaired by Dr.Owen on 09/05/2018--- repeat echocardiogram from 10/08/2018 without acute findings  3) paroxysmal atrial fibrillation--- s/p Maze Procedure on 09/05/2018, currently in sinus rhythm,  Coumadin on hold due to GI bleed, currently not on AV nodal blocking agent due to recent episode of type II AV block----INR drifting down from 2.2 to 1.7, no need for anticoagulation at this time been ongoing GI bleed  4) rheumatic valvular heart disease--- she is s/p aortic valve replacement with a BOVINE bioprosthetic tissue valve, mitral valve replacement with a bovine bioprosthetic tissue valve, tricuspid valve repair with ring annuloplasty, Maze procedure, and 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 Roxy Manns. --Discussed with Dr. Roxy Manns (CT surgery) who states if needed okay to hold anticoagulation for a few days as patient has tissue valve not mechanical valve.  Echo from 10/08/2018 with EF of 60 to 65%,, dCHF, no regional wall motion abnormalities  5) chronic anticoagulation--- patient was previously on Eliquis for A. fib, post cardiac surgery she was placed on Coumadin, INR   was 2.2, patient is received vitamin K 2.5 mg x 1, INR down to 1.7 at this time, repeat INR in a.m.--- ---As per Dr. Roxy Manns --no need for heparin bridge and okay to continue to hold anticoagulation patient has bioprosthetic bovine valve not mechanical valve---- --follow H&H and transfuse as needed....   6) acute GI  bleed secondary to duodenal ulcer----continue IV Protonix drip, please see EGD report from 10/08/2018 below, please see management as above #1,   Disposition/Need for in-Hospital Stay- patient unable to be discharged at this time due to ongoing GI bleed with hemodynamic instability requiring transfusions of PRBC  due to bleeding duodenal ulcer requiring interventional radiology intervention -  embolization  Code Status : Full code  Family Communication:   (patient is alert, awake and coherent) left  Message for her Husband Mr. Aleiyah Halpin at [phone]   Disposition Plan  : Transfer to [facility] to be evaluated by interventional radiology for possible embolization given persistent duodenal ulcer bleeding--- Discussed with ---Gi (Dr Oneida Alar at [facility]) and Howie Ill at [facility]/ IR at [facility]- (Dr.Wagner)/Cardiology Dr. Domenic Polite (at [facility]) and CT Surgery Dr C. Roxy Manns at [facility]   Procedure:- EGD 10/08/18 Impression: - UGI BLEED DUE TO ULCER AT JUNCTION D1/D2, NOT AMENABLE TO ENDOSCOPIC THERAPY.                           - H PYLORI STATUS UNKNOWN                           - No specimens collected.  Consults  :  Gi (Dr Oneida Alar at [facility]) and Howie Ill at [employer] IR at [facility]- (Dr.Wagner)/Cardiology Dr. Domenic Polite (at [facility]) and CT Surgery Dr C. Roxy Manns at [facility]  DVT Prophylaxis  :   - SCDs (Gi Bleed)  Lab Results  Component Value Date   PLT 173 10/08/2018    Inpatient Medications  Scheduled Meds: . sodium chloride   Intravenous Once  . sodium chloride   Intravenous Once  . furosemide  20 mg Intravenous Once  . furosemide  20 mg Intravenous Once  . furosemide  20 mg Intravenous Once  . lidocaine  5 mL Mouth/Throat Once  . sodium chloride flush  3 mL Intravenous Q12H   Continuous Infusions: . pantoprozole (PROTONIX) infusion 8 mg/hr (10/08/18 1530)   PRN Meds:.acetaminophen **OR** acetaminophen    Anti-infectives (From admission, onward)   None        Objective:   Vitals:   10/08/18 1500  10/08/18 1507 10/08/18 1515 10/08/18 1545  BP:  98/67 106/64 92/61  Pulse: 93 92 91 94  Resp: 19 19 16 12   Temp:   98.6 F (37 C) 98.2 F (36.8 C)  TempSrc:   Oral Oral  SpO2: 100% 100% 100% 100%    Wt Readings from Last 3 Encounters:  10/07/18 53.1 kg  09/23/18 51.7 kg  09/23/18 51.9 kg     Intake/Output Summary (Last 24 hours) at 10/08/2018 1622 Last data filed at 10/08/2018 1530 Gross per 24 hour  Intake 1802.72 ml  Output 20 ml  Net 1782.72 ml     Physical Exam  Gen:- Awake Alert,  In no apparent distress  HEENT:- [place].AT, No sclera icterus Neck-Supple Neck,No JVD,.  Lungs-  CTAB , fair  symmetrical air movement CV- S1, S2 normal, regular , nicely healing sternotomy scar Abd-  +ve B.Sounds, Abd Soft, No tenderness,    Extremity/Skin:- No  edema, pedal pulses present  Psych-affect is appropriate, oriented x3 Neuro-no new focal deficits, no tremors   Data Review:   Micro Results Recent Results (from the past 240 hour(s))  SARS Coronavirus 2 (CEPHEID - Performed in [place] hospital lab), Hosp Order     Status: None   Collection Time: 10/07/18  6:00 PM   Specimen: Nasopharyngeal Swab  Result Value Ref Range Status   SARS Coronavirus 2 NEGATIVE NEGATIVE Final    Comment: (NOTE) If result is NEGATIVE SARS-CoV-2 target nucleic acids are NOT DETECTED. The SARS-CoV-2 RNA is generally detectable in upper and lower  respiratory specimens during the acute phase of infection. The lowest  concentration of SARS-CoV-2 viral copies this assay can detect is 250  copies / mL. A negative result does not preclude SARS-CoV-2 infection  and should not be used as the sole basis for treatment or other  patient management decisions.  A negative result may occur with  improper specimen collection / handling, submission of specimen other  than nasopharyngeal swab, presence of viral mutation(s) within the  areas targeted by this assay, and inadequate number of viral copies  (<250  copies / mL). A negative result must be combined with clinical  observations, patient history, and epidemiological information. If result is POSITIVE SARS-CoV-2 target nucleic acids are DETECTED. The SARS-CoV-2 RNA is generally detectable in upper and lower  respiratory specimens dur ing the acute phase of infection.  Positive  results are indicative of active infection with SARS-CoV-2.  Clinical  correlation with patient history and other diagnostic information is  necessary to determine patient infection status.  Positive results do  not rule out bacterial infection or co-infection with other viruses. If result is PRESUMPTIVE POSTIVE SARS-CoV-2 nucleic acids MAY BE PRESENT.   A presumptive positive result was obtained on the submitted specimen  and confirmed on repeat testing.  While 2019 novel coronavirus  (SARS-CoV-2) nucleic acids may be present in the submitted sample  additional confirmatory testing may be necessary for epidemiological  and / or clinical management purposes  to differentiate between  SARS-CoV-2 and other Sarbecovirus currently known to infect humans.  If clinically indicated additional testing with an alternate test  methodology [phone]) is advised. The SARS-CoV-2 RNA is generally  detectable in upper and lower respiratory sp ecimens during the acute  phase of infection. The expected result is Negative. Fact Sheet for Patients:  StrictlyIdeas.no Fact Sheet for Healthcare Providers: BankingDealers.co.za This test is not yet approved or cleared by the [place] FDA and has been authorized for detection and/or diagnosis of SARS-CoV-2 by FDA under an Emergency Use Authorization (EUA).  This EUA will remain in effect (meaning this test can be used) for the duration of the COVID-19 declaration under Section 564(b)(1) of the Act, 21 U.S.C. section 360bbb-3(b)(1), unless the authorization is terminated or revoked  sooner. Performed at [facility], [address] [place], [place] [address]   MRSA PCR Screening     Status: None   Collection Time: 10/08/18  6:37 AM   Specimen: Nasal Mucosa; Nasopharyngeal  Result Value Ref Range Status   MRSA by PCR NEGATIVE NEGATIVE Final    Comment:        The GeneXpert MRSA Assay (FDA approved for NASAL specimens only), is one component of a comprehensive MRSA colonization surveillance program. It is  not intended to diagnose MRSA infection nor to guide or monitor treatment for MRSA infections. Performed at [facility], [address] [place], [place] [address]     Radiology Reports No results found.   CBC Recent Labs  Lab 10/07/18 1343 10/07/18 1543 10/08/18 0421 10/08/18 1306 10/08/18 1447  WBC 7.1 7.0 6.2 8.4 14.7*  HGB 7.4* 7.6* 6.6* 7.2* 6.7*  HCT 24.2* 24.5* 20.8* 22.5* 20.8*  PLT 276 306 216 192 173  MCV 100.8* 100.0 94.5 94.1 92.0  MCH 30.8 31.0 30.0 30.1 29.6  MCHC 30.6 31.0 31.7 32.0 32.2  RDW 14.6 14.9 15.7* 16.9* 17.1*    Chemistries  Recent Labs  Lab 10/07/18 1343 10/07/18 1543 10/08/18 0421  NA 139 141 141  K 4.7 4.3 4.0  CL 105 107 112*  CO2 24 23 21*  GLUCOSE 109* 106* 101*  BUN 31* 33* 38*  CREATININE 0.62 0.88 0.46  CALCIUM 9.0 9.2 7.9*  AST  --  17  --   ALT  --  15  --   ALKPHOS  --  74  --   BILITOT  --  0.4  --    ------------------------------------------------------------------------------------------------------------------ No results for input(s): CHOL, HDL, LDLCALC, TRIG, CHOLHDL, LDLDIRECT in the last 72 hours.  Lab Results  Component Value Date   HGBA1C 5.4 09/02/2018   ------------------------------------------------------------------------------------------------------------------ No results for input(s): TSH, T4TOTAL, T3FREE, THYROIDAB in the last 72 hours.  Invalid input(s): FREET3  ------------------------------------------------------------------------------------------------------------------ No results for input(s): VITAMINB12, FOLATE, FERRITIN, TIBC, IRON, RETICCTPCT in the last 72 hours.  Coagulation profile Recent Labs  Lab 10/07/18 1543 10/08/18 0421 10/08/18 1447  INR 1.7* 2.2* 1.7*    No results for input(s): DDIMER in the last 72 hours.  Cardiac Enzymes No results for input(s): CKMB, TROPONINI, MYOGLOBIN in the last 168 hours.  Invalid input(s): CK ------------------------------------------------------------------------------------------------------------------    Component Value Date/Time   BNP 549.0 (H) 08/31/2018 1451     Roxan Hockey M.D on 10/08/2018 at 4:22 PM  Go to [email or web address] - for contact info  [facility] - Office  [phone]

## 2018-10-08 NOTE — Plan of Care (Signed)
  Problem: Education: Goal: Knowledge of General Education information will improve Description: Including pain rating scale, medication(s)/side effects and non-pharmacologic comfort measures Outcome: Progressing   Problem: Elimination: Goal: Will not experience complications related to urinary retention Outcome: Progressing   Problem: Clinical Measurements: Goal: Complications related to the disease process, condition or treatment will be avoided or minimized Outcome: Progressing   Problem: Safety: Goal: Ability to remain free from injury will improve Outcome: Progressing

## 2018-10-08 NOTE — H&P (View-Only) (Signed)
Referring Provider: [facility] Primary Care Physician:  Kathyrn Drown, MD Primary Gastroenterologist:  Dr. Laural Golden  Date of Admission: 10/08/18 Date of Consultation: 10/07/18  Reason for Consultation:  Melena, acute blood loss anemia  HPI:   Hannah Roy is a 78 y.o. year old female with medical history significant for persistent atrial fibrillation, rheumatic valvular disease, and hypertension who was recently hospitalized from 08/31/2018-09/13/2018.  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  She was discharged on Coumadin for anticoagulation.  Her antiarrhythmic/rate control and antihypertensive medications were discontinued due to bradycardia/orthostasis.    Patient presented to the ED on 10/07/18 feeling fatigued and short of breath. She had been noticing dark black stools, but thought due to iron. She had been seen by her cardiologist on 10/07/18 and lab work showed hemoglobin 7.4 compared to 9.8 on 09/12/18.   Labs are notable for hemoglobin 7.6, WBC 7.0, platelets 306,000, sodium 141, potassium 4.3, BUN 33, creatinine 0.88, INR 1.7.  FOBT was positive. ED physician discussed case with cardiology who recommended continuing anticoagulation due to recent cardiac surgery, repeat echo on 6/30, and cardiology consult. Patient was ordered 1 unit PRBCs in ED.   Today she explains that after her cardiac surgery, she was discharged on 6/5 and felt well for about 1 week. Subsequently, she began to be constipation and lose her appetite. Took stool softeners and laxatives and had solid black stool; however, she is on iron and states she has been having black stools with iron. Continued taking stool softener and was having BMs daily thereafter. She has continued "forcing" herself to eat although she has no appetite.  About 1 week ago she started getting fatigued and short of breath with walking. Admits to some difficulty swallowing and horseness since her surgery which has continued to improve. She is careful to chew her foods well and hasn't experienced any foods getting stuck. Continues to take pills with applesauce. Takes her time with liquids. Admits to a little nausea yesterday, but none prior. No vomiting, reflux symptoms, abdominal pain, chest pain, or shortness of breath at rest. Denies lightheadedness and dizziness.  She has been on 81 mg aspirin daily since discharge on 6/5. No other NSAIDs. No alcohol, or illicit drugs. Former smoker.     Last colonoscopy in 2018 with two 5-6 mm polyps in the distal sigmoid colon and in the cecum. External hemorrhoids. Otherwise normal.   Past Medical History:  Diagnosis Date  . Aortic atherosclerosis ([place])   . Aortic insufficiency, rheumatic   . 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 Atrial fibrillation ([place]) 05/10/2018  . Colon polyps   . Essential hypertension, benign   . History of seasonal allergies   . Hypertension   . Hypothyroidism   . Mitral stenosis with regurgitation, rheumatic   . Osteopenia 2006  . Persistent atrial fibrillation   . 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   21 mm [facility] Ease stented bovine pericardial tissue valve  . S/P ascending aortic aneurysm repair 09/05/2018   28 mm Hemashield platinum supracoronary straight graft  . S/P Maze operation for atrial fibrillation 09/05/2018   Complete bilateral atrial lesion set using bipolar radiofrequency and cryothermy ablation with clipping of LA appendage  . 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   29 mm [facility] Mitral stented bovine pericardial  tissue valve  . 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 Tricuspid regurgitation     Past Surgical History:  Procedure Laterality Date  . AORTIC  VALVE REPLACEMENT N/A 09/05/2018   Procedure: AORTIC VALVE REPLACEMENT (AVR) USING MAGNA EASE 21MM AOTIC BIOPROSTHESIS VALVE;  Surgeon: Rexene Alberts, MD;  Location: [place];  Service: Open Heart Surgery;  Laterality: N/A;  . APPENDECTOMY    . CLIPPING OF ATRIAL APPENDAGE  09/05/2018   Procedure: Clipping Of Atrial Appendage;  Surgeon: Rexene Alberts, MD;  Location: [place];  Service: Open Heart Surgery;;  . COLONOSCOPY  03/23/2011   repeat in 5 years,Procedure: COLONOSCOPY;  Surgeon: Rogene Houston, MD;  [facility];  Service: Endoscopy;  Laterality: N/A;  1:00  . COLONOSCOPY N/A 11/09/2016   Procedure: COLONOSCOPY;  Surgeon: Rogene Houston, MD;  [facility];  Service: Endoscopy;  Laterality: N/A;  1030  . MAZE N/A 09/05/2018   Procedure: MAZE;  Surgeon: Rexene Alberts, MD;  Location: [place];  Service: Open Heart Surgery;  Laterality: N/A;  . MITRAL VALVE REPLACEMENT N/A 09/05/2018   Procedure: MITRAL VALVE (MV) REPLACEMENT USING MAGNA MITRAL EASE 29MM BIOPROSTHESIS VALVE;  Surgeon: Rexene Alberts, MD;  Location: [place];  Service: Open Heart Surgery;  Laterality: N/A;  . REPLACEMENT ASCENDING AORTA N/A 09/05/2018   Procedure: SUPRACORONARY STRAIGHT GRAFT REPLACEMENT OF ASCENDING AORTA;  Surgeon: Rexene Alberts, MD;  Location: [place];  Service: Open Heart Surgery;  Laterality: N/A;  . RIGHT/LEFT HEART CATH AND CORONARY ANGIOGRAPHY N/A 08/29/2018   Procedure: RIGHT/LEFT HEART CATH AND CORONARY ANGIOGRAPHY;  Surgeon: Lorretta Harp, MD;  Location: [facility];  Service: Cardiovascular;  Laterality: N/A;  . TEE WITHOUT CARDIOVERSION N/A 08/29/2018   Procedure: TRANSESOPHAGEAL ECHOCARDIOGRAM (TEE);  Surgeon: Skeet Latch, MD;  Location: [place];  Service: Cardiovascular;  Laterality: N/A;  . TRICUSPID VALVE REPLACEMENT N/A 09/05/2018   Procedure: TRICUSPID VALVE REPAIR USING EDWARDS MC3 TRICUSPID ANNULOPLASTY RING SIZE T28;  Surgeon: Rexene Alberts, MD;   Location: [place];  Service: Open Heart Surgery;  Laterality: N/A;  . TUBAL LIGATION      Prior to Admission medications   Medication Sig Start Date End Date Taking? Authorizing Provider  aspirin EC 81 MG EC tablet Take 1 tablet (81 mg total) by mouth daily. 09/14/18  Yes Roddenberry, Myron G, PA-C  bisacodyl (DULCOLAX) 5 MG EC tablet Take 5 mg by mouth daily.   Yes [provider]  ferrous [id number] C-folic acid (TRINSICON / FOLTRIN) capsule Take 1 capsule by mouth daily with breakfast. 09/14/18  Yes Roddenberry, Myron G, PA-C  ondansetron (ZOFRAN) 4 MG tablet Take 1 tablet (4 mg total) by mouth every 8 (eight) hours as needed for nausea or vomiting. 09/23/18 09/23/19 Yes Gold, Wayne E, PA-C  traMADol (ULTRAM) 50 MG tablet Take 1 tablet (50 mg total) by mouth every 4 (four) hours as needed for moderate pain. 09/19/18  Yes Rexene Alberts, MD  warfarin (COUMADIN) 2.5 MG tablet Increase coumadin to 1 1/2 tablets daily Patient taking differently: Take 3.75 mg by mouth every evening.  10/01/18  Yes Herminio Commons, MD    Current Facility-Administered Medications  Medication Dose Route Frequency Provider Last Rate Last Dose  . 0.9 %  sodium chloride infusion (Manually program via Guardrails IV Fluids)   Intravenous Once Oswald Hillock, MD      . 0.9 %  sodium chloride infusion  10 mL/hr Intravenous Once Idol, Almyra Free, PA-C      . acetaminophen (TYLENOL) tablet 650 mg  650 mg Oral Q6H PRN Lenore Cordia, MD       Or  . acetaminophen (TYLENOL) suppository 650 mg  650 mg Rectal Q6H PRN Lenore Cordia, MD      . pantoprazole (PROTONIX) EC tablet 40 mg  40 mg Oral Daily Patel, Vishal R, MD      . sodium chloride flush (NS) 0.9 % injection 3 mL  3 mL Intravenous Q12H Zada Finders R, MD   3 mL at 10/07/18 2152    Allergies as of 10/07/2018 - Review Complete 10/07/2018  Allergen Reaction Noted  . Bee venom Anaphylaxis 11/10/2010  . Boniva [ibandronic acid] Nausea And Vomiting and  Other (See Comments) 07/26/2012  . Lisinopril Cough 07/26/2012  . Latex Rash 11/10/2010    Family History  Problem Relation Age of Onset  . Colon cancer Daughter     Social History   Socioeconomic History  . Marital status: Married    Spouse name: Not on file  . Number of children: Not on file  . Years of education: Not on file  . Highest education level: Not on file  Occupational History  . Not on file  Social Needs  . Financial resource strain: Not very hard  . Food insecurity    Worry: Never true    Inability: Never true  . Transportation needs    Medical: No    Non-medical: No  Tobacco Use  . Smoking status: Former Smoker    Packs/day: 0.50    Years: 10.00    Pack years: 5.00  . Smokeless tobacco: Never Used  Substance and Sexual Activity  . Alcohol use: No  . Drug use: No  . Sexual activity: Not on file  Lifestyle  . Physical activity    Days per week: 3 days    Minutes per session: 20 min  . Stress: Only a little  Relationships  . Social connections    Talks on phone: More than three times a week    Gets together: Three times a week    Attends religious service: 1 to 4 times per year    Active member of club or organization: No    Attends meetings of clubs or organizations: Never    Relationship status: Married  . Intimate partner violence    Fear of current or ex partner: No    Emotionally abused: No    Physically abused: No    Forced sexual activity: No  Other Topics Concern  . Not on file  Social History Narrative  . Not on file    Review of Systems: Gen: Denies fever, chills CV: Denies chest pain, heart palpitations, syncope, edema  Resp: Denies shortness of breath with rest, cough GI: See HPI  GU : Denies urinary burning, urinary frequency, urinary incontinence, hematuria MS: Denies joint pain,swelling, cramping Derm: Denies rash, itching, dry skin Psych: Denies depression, anxiety Heme: Denies other bruising, bleeding  Physical  Exam: Vital signs in last 24 hours: Temp:  [97.1 F (36.2 C)-98.7 F (37.1 C)] 98.7 F (37.1 C) (06/30 0717) Pulse Rate:  [80-99] 89 (06/30 0717) Resp:  [16-26] 25 (06/30 0717) BP: (80-123)/(60-97) 87/64 (06/30 0717) SpO2:  [94 %-100 %] 99 % (06/30 0717) FiO2 (%):  [28 %] 28 % (06/30 0657) Weight:  [53.1 kg] 53.1 kg (06/29 1255) Last BM Date: 10/08/18 General:  Alert, pleasant and cooperative in NAD Head:  Normocephalic and atraumatic. Eyes:  Sclera clear, no icterus.   Conjunctiva pink. Ears:  Normal auditory acuity. Nose:  No deformity, discharge,  or lesions. Mouth:  No deformity or lesionsl. Lungs:  Clear throughout to auscultation.   No wheezes, crackles, or rhonchi. No acute distress. Heart:  Regular rate and rhythm; no murmurs, clicks, rubs,  or gallops. Scar along center of chesst from recent cardiac surgery. Abdomen:  Soft, nontender and nondistended. No masses, hepatosplenomegaly or hernias noted. Normal bowel sounds, without guarding, and without rebound.   Rectal:  Deferred Msk:  Symmetrical without gross deformities. Normal posture. Pulses:  Normal pulses noted. Extremities:  Without clubbing or edema. Neurologic:  Alert and  oriented x4;  grossly normal neurologically. Skin:  Intact without significant lesions or rashes. Psych:  Alert and cooperative. Normal mood and affect.  Intake/Output from previous day: 06/29 0701 - 06/30 0700 In: 905.9 [P.O.:480; I.V.:110.9; Blood:315] Out: -    Lab Results: Recent Labs    10/07/18 1343 10/07/18 1543 10/08/18 0421  WBC 7.1 7.0 6.2  HGB 7.4* 7.6* 6.6*  HCT 24.2* 24.5* 20.8*  PLT 276 306 216   BMET Recent Labs    10/07/18 1343 10/07/18 1543 10/08/18 0421  NA 139 141 141  K 4.7 4.3 4.0  CL 105 107 112*  CO2 24 23 21*  GLUCOSE 109* 106* 101*  BUN 31* 33* 38*  CREATININE 0.62 0.88 0.46  CALCIUM 9.0 9.2 7.9*   LFT Recent Labs    10/07/18 1543  PROT 7.5  ALBUMIN 4.0  AST 17  ALT 15  ALKPHOS 74   BILITOT 0.4   PT/INR Recent Labs    10/07/18 1543 10/08/18 0421  LABPROT 20.0* 23.7*  INR 1.7* 2.2*    Impression: 78 y.o. female s/p AVR with bioprosthetic tissue valve, MVR with bioprosthetic tissue valve, tricuspid valve repair by annuloplasty, maze procedure, and repair of ascending thoracic aortic aneurysm on 09/05/18 on Coumadin presenting with worsening fatigue and SOB. She was found to have hemoglobin of 7.6 on admission, down from 9.8 on 6/4. FOBT positive in ED. Melena reported since discharge on 6/5, but patient is on iron, so it is not clear how long she has has been bleeding. On aspirin 81 mg, no other NSAIDs. No alcohol, illicit drugs, or smoking.   Patient continues to have ongoing Melena today, but also requires anticoagulation in the setting of recent cardiac surgery. Coumadin on hold and heparin in use for anticoagulation. After one unit of blood on admission, patients hemoglobin continued to drop and was 6.6 this morning. INR 2.2. Heparin has been put on hold at this time and patient received one more unit of blood.   In the setting of daily aspirin, coumadin, this is likely upper GI bleed. Differentials include gastritis, PUD, AVMs. Will pursue EGD for further evaluation and management.   Plan: Prptonix 40 mg IV BID Heparin has been put on hold today as INR is 2.2.  Patient will need EGD for evaluation and possible endoscopic therapy. EGD will be today per Dr. Oneida Alar with propofol. The risks, benefits, and alternatives have been discussed in detail with patient. They have stated understanding and desire to proceed.  Transfuse as necessary.  Remain NPO at this time.      LOS: 0 days    10/08/2018, 8:01 AM   Aliene Altes, [facility]

## 2018-10-08 NOTE — Transfer of Care (Signed)
Immediate Anesthesia Transfer of Care Note  Patient: Hannah Roy  Procedure(s) Performed: ESOPHAGOGASTRODUODENOSCOPY (EGD) WITH PROPOFOL (N/A )  Patient Location: PACU  Anesthesia Type:General  Level of Consciousness: awake and alert   Airway & Oxygen Therapy: Patient Spontanous Breathing  Post-op Assessment: Report given to RN  Post vital signs: Reviewed and stable  Last Vitals:  Vitals Value Taken Time  BP    Temp    Pulse 106 10/08/18 1301  Resp 20 10/08/18 1301  SpO2 95 % 10/08/18 1301  Vitals shown include unvalidated device data.  Last Pain:  Vitals:   10/08/18 1201  TempSrc: Oral  PainSc: 0-No pain         Complications: No apparent anesthesia complications

## 2018-10-08 NOTE — Progress Notes (Signed)
Bedside report given to Angie, RN. Right groin checked, dressing c/d/i, level 0.

## 2018-10-08 NOTE — Interval H&P Note (Signed)
History and Physical Interval Note:  10/08/2018 12:07 PM  Hannah Roy  has presented today for surgery, with the diagnosis of melena; acute blood loss anemia.  The various methods of treatment have been discussed with the patient and family. After consideration of risks, benefits and other options for treatment, the patient has consented to  Procedure(s): ESOPHAGOGASTRODUODENOSCOPY (EGD) WITH PROPOFOL (N/A) as a surgical intervention.  The patient's history has been reviewed, patient examined, no change in status, stable for surgery.  I have reviewed the patient's chart and labs.  Questions were answered to the patient's satisfaction.     [employer]

## 2018-10-08 NOTE — Progress Notes (Addendum)
ANTICOAGULATION CONSULT NOTE - Initial Consult  Pharmacy Consult for heparin gtt  Indication: s/p AVR, MVR, aortic aneurysm repair w/ symptomatic anemia. Requiring continued anticoagulation post cardiac surgery.  Allergies  Allergen Reactions  . Bee Venom Anaphylaxis  . Boniva [Ibandronic Acid] Nausea And Vomiting and Other (See Comments)    Upset stomach, flu like symptoms   . Lisinopril Cough  . Latex Rash    Patient Measurements:   Heparin Dosing Weight:     Vital Signs: Temp: 98.7 F (37.1 C) (06/30 0717) Temp Source: Oral (06/30 0717) BP: 87/64 (06/30 0717) Pulse Rate: 89 (06/30 0717)  Labs: Recent Labs    10/07/18 1343 10/07/18 1543 10/08/18 0421  HGB 7.4* 7.6* 6.6*  HCT 24.2* 24.5* 20.8*  PLT 276 306 216  LABPROT  --  20.0* 23.7*  INR  --  1.7* 2.2*  HEPARINUNFRC  --   --  0.41  CREATININE 0.62 0.88 0.46    Estimated Creatinine Clearance: 45.1 mL/min (by C-G formula based on SCr of 0.46 mg/dL).   Medical History: Past Medical History:  Diagnosis Date  . Aortic atherosclerosis ([place])   . Aortic insufficiency, rheumatic   . 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 Atrial fibrillation ([place]) 05/10/2018  . Colon polyps   . Essential hypertension, benign   . History of seasonal allergies   . Hypertension   . Hypothyroidism   . Mitral stenosis with regurgitation, rheumatic   . Osteopenia 2006  . Persistent atrial fibrillation   . 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   21 mm [facility] Ease stented bovine pericardial tissue valve  . S/P ascending aortic aneurysm repair 09/05/2018   28 mm Hemashield platinum supracoronary straight graft  . S/P Maze operation for atrial fibrillation 09/05/2018   Complete bilateral atrial lesion set using bipolar radiofrequency and cryothermy ablation with clipping of LA appendage  . 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   29 mm [facility] Mitral stented bovine  pericardial tissue valve  . 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 Tricuspid regurgitation     Medications:  Medications Prior to Admission  Medication Sig Dispense Refill Last Dose  . aspirin EC 81 MG EC tablet Take 1 tablet (81 mg total) by mouth daily.   10/07/2018 at Unknown time  . bisacodyl (DULCOLAX) 5 MG EC tablet Take 5 mg by mouth daily.   10/07/2018 at Unknown time  . ferrous [id number] C-folic acid (TRINSICON / FOLTRIN) capsule Take 1 capsule by mouth daily with breakfast. 30 capsule 0 10/07/2018 at Unknown time  . ondansetron (ZOFRAN) 4 MG tablet Take 1 tablet (4 mg total) by mouth every 8 (eight) hours as needed for nausea or vomiting. 30 tablet 0 Past Month at Unknown time  . traMADol (ULTRAM) 50 MG tablet Take 1 tablet (50 mg total) by mouth every 4 (four) hours as needed for moderate pain. 20 tablet 0 Past Week at Unknown time  . warfarin (COUMADIN) 2.5 MG tablet Increase coumadin to 1 1/2 tablets daily (Patient taking differently: Take 3.75 mg by mouth every evening. ) 45 tablet 2 10/06/2018 at 1800   Scheduled:  . sodium chloride   Intravenous Once  . pantoprazole  40 mg Oral Daily  . sodium chloride flush  3 mL Intravenous Q12H   Infusions:  . sodium chloride    . heparin 850 Units/hr (10/07/18 2150)   PRN: acetaminophen **OR** acetaminophen Anti-infectives (From admission,  onward)   None      Assessment: Hannah Roy a 78 y.o. female requires anticoagulation with a heparin iv infusion for the indication of  atrial fibrillation. Heparin gtt will be started following pharmacy protocol per pharmacy consult. Patient is not on previous oral anticoagulant that will require aPTT/HL correlation before transitioning to only HL monitoring.   Patient has INR 2.2, was admitted on warfarin home dose 3.75mg  po daily. Last dose 6/28 HL 0.41 therapeutic, INR 2.2 -   Goal of Therapy:  Heparin level 0.3-0.7 units/ml Monitor  platelets by anticoagulation protocol: Yes   Plan:  Stop heparin drip now that INR is therapeutic and restart once INR less than 2. Check INR in morning Continue to monitor H&H and platelets  [name], PharmD Clinical Pharmacist 10/08/2018 7:55 AM

## 2018-10-08 NOTE — Procedures (Signed)
Interventional Radiology Procedure Note  Procedure: Successful coil embolization of the GDA.  Vascular Access: Right CFA, 52F --> AngioSeal  Complications: None  Estimated Blood Loss: None  Recommendations: - Return to floor - Continue to trend H&H and transfuse as needed - PPI - Advance diet at the discretion of GI   Signed,  Criselda Peaches, MD

## 2018-10-08 NOTE — Progress Notes (Signed)
[facility] came for transport. Dr. Denton Brick ordered more blood to be given. Hung 1 unit and [facility] waited for the 15 minute check. Transported the blood with them.

## 2018-10-08 NOTE — Progress Notes (Addendum)
CRITICAL VALUE ALERT  Critical Value:  Hemoglobin 6.6  Date & Time Notied:  10/08/18 0514  Provider Notified: Dr. Darrick Meigs   Dr. Darrick Meigs notified that pt's hemoglobin dropped from 7.6 to 6.6 and that I have given 1 unit of PRBCs that ended around 0130.  Advised that pt was on a heparin drip as well.  Order to give one unit of PRBCs given.  Requested to move pt to SDU given concern for active bleed, drop in hemoglobin, and still requiring heparin drip for anticoagulation given recent extensive cardiac surgery at [facility].  Order to move pt to SDU.  Orders placed.  Verbally read back per protocol.

## 2018-10-08 NOTE — Anesthesia Postprocedure Evaluation (Signed)
Anesthesia Post Note  Patient: Hannah Roy  Procedure(s) Performed: ESOPHAGOGASTRODUODENOSCOPY (EGD) WITH PROPOFOL (N/A )  Patient location during evaluation: PACU Anesthesia Type: General Level of consciousness: awake and alert and oriented Pain management: pain level controlled Vital Signs Assessment: post-procedure vital signs reviewed and stable Respiratory status: spontaneous breathing Cardiovascular status: blood pressure returned to baseline and stable Postop Assessment: no apparent nausea or vomiting Anesthetic complications: no     Last Vitals:  Vitals:   10/08/18 1201 10/08/18 1251  BP: (!) 83/54   Pulse:    Resp: (!) 22   Temp: 36.8 C 36.9 C  SpO2: 100%     Last Pain:  Vitals:   10/08/18 1201  TempSrc: Oral  PainSc: 0-No pain                 Moriah Loughry

## 2018-10-09 DIAGNOSIS — D689 Coagulation defect, unspecified: Secondary | ICD-10-CM

## 2018-10-09 DIAGNOSIS — I480 Paroxysmal atrial fibrillation: Secondary | ICD-10-CM

## 2018-10-09 DIAGNOSIS — D500 Iron deficiency anemia secondary to blood loss (chronic): Secondary | ICD-10-CM

## 2018-10-09 LAB — BPAM RBC
Blood Product Expiration Date: 202007122359
Blood Product Expiration Date: 202007292359
Blood Product Expiration Date: 202007292359
ISSUE DATE / TIME: 202006292230
ISSUE DATE / TIME: 202006300643
ISSUE DATE / TIME: 202006301522
Unit Type and Rh: 5100
Unit Type and Rh: 5100
Unit Type and Rh: 9500

## 2018-10-09 LAB — PROTIME-INR
INR: 1.3 — ABNORMAL HIGH (ref 0.8–1.2)
Prothrombin Time: 16.2 seconds — ABNORMAL HIGH (ref 11.4–15.2)

## 2018-10-09 LAB — COMPREHENSIVE METABOLIC PANEL
ALT: 12 U/L (ref 0–44)
AST: 13 U/L — ABNORMAL LOW (ref 15–41)
Albumin: 2.5 g/dL — ABNORMAL LOW (ref 3.5–5.0)
Alkaline Phosphatase: 40 U/L (ref 38–126)
Anion gap: 11 (ref 5–15)
BUN: 39 mg/dL — ABNORMAL HIGH (ref 8–23)
CO2: 20 mmol/L — ABNORMAL LOW (ref 22–32)
Calcium: 7.7 mg/dL — ABNORMAL LOW (ref 8.9–10.3)
Chloride: 110 mmol/L (ref 98–111)
Creatinine, Ser: 0.79 mg/dL (ref 0.44–1.00)
GFR calc Af Amer: >60 mL/min (ref >60)
GFR calc non Af Amer: >60 mL/min (ref >60)
Glucose, Bld: 121 mg/dL — ABNORMAL HIGH (ref 70–99)
Potassium: 3.4 mmol/L — ABNORMAL LOW (ref 3.5–5.1)
Sodium: 141 mmol/L (ref 135–145)
Total Bilirubin: 0.7 mg/dL (ref 0.3–1.2)
Total Protein: 4.8 g/dL — ABNORMAL LOW (ref 6.5–8.1)

## 2018-10-09 LAB — TYPE AND SCREEN
ABO/RH(D): O POS
Antibody Screen: NEGATIVE
Unit division: 0
Unit division: 0
Unit division: 0

## 2018-10-09 LAB — CBC
HCT: 24.9 % — ABNORMAL LOW (ref 36.0–46.0)
Hemoglobin: 8.7 g/dL — ABNORMAL LOW (ref 12.0–15.0)
MCH: 30.2 pg (ref 26.0–34.0)
MCHC: 34.9 g/dL (ref 30.0–36.0)
MCV: 86.5 fL (ref 80.0–100.0)
Platelets: 138 10*3/uL — ABNORMAL LOW (ref 150–400)
RBC: 2.88 MIL/uL — ABNORMAL LOW (ref 3.87–5.11)
RDW: 15.3 % (ref 11.5–15.5)
WBC: 10 10*3/uL (ref 4.0–10.5)
nRBC: 0.3 % — ABNORMAL HIGH (ref 0.0–0.2)

## 2018-10-09 LAB — HEMOGLOBIN AND HEMATOCRIT, BLOOD
HCT: 22.4 % — ABNORMAL LOW (ref 36.0–46.0)
HCT: 21.9 % — ABNORMAL LOW (ref 36.0–46.0)
Hemoglobin: 7.5 g/dL — ABNORMAL LOW (ref 12.0–15.0)
Hemoglobin: 7.4 g/dL — ABNORMAL LOW (ref 12.0–15.0)

## 2018-10-09 MED ORDER — SODIUM CHLORIDE 0.9 % IV SOLN
INTRAVENOUS | Status: DC
  Administered 2018-10-09 – 2018-10-10 (×3): via INTRAVENOUS

## 2018-10-09 MED ORDER — POTASSIUM CHLORIDE 10 MEQ/100ML IV SOLN
10.0000 meq | INTRAVENOUS | Status: AC
  Administered 2018-10-09 (×2): 10 meq via INTRAVENOUS
  Filled 2018-10-09 (×2): qty 100

## 2018-10-09 NOTE — Consult Note (Addendum)
[facility] Consult: 1:26 PM 10/09/2018  LOS: 1 day    Referring Provider: Dr Maren Beach  Primary Care Physician:  Kathyrn Drown, MD Primary Gastroenterologist:  Dr Laural Golden.      Reason for Consultation: duodenal ulcer with bleeding   HPI: Hannah Roy is a 78 y.o. female.  Originally from [place].  Hx htn. Rheumatic heart dz. started Eliquis ~3 or 4 months ago for PAF, switched to Coumadin a month ago.  Uterine fibroids.   Colonoscopies in  2007 (TVA with HGD),  2012: multiple polyps (3 of 4 were TAs without HGD), melanosis, hemorrhoids.  2018 : 2 polyps (TVA without HGD), hemorrhoids.     Admission 5/23 -09/13/2018 to [facility] hospital.  S/p 09/05/2018 bio prosthetic AVR and bioprosthetic MVR, tricuspid valve ring annuloplasty repair, ascending aortic aneurysm repair.  Issues included A. fib with RVR Hgb as low as 5.8 on 5/28, 9.8 at discharge.  Received total 4 U PRBC as well as FFP and cryoprecipitate.   Baseline hgb looks to be 12.5 to 14.5 between 04/2018 and 08/09/18 Not hemocculted.   At discharge was switched from Eliquis to Coumadin.  Trinsicon added.  Previous 81 ASA contd.  No PPI or H2 blocker.    For about a week after returning home she did well, she was walking she was eating great.  She then got constipated and after taking stool softeners and MiraLAX, finally had a difficult to pass black, formed stool.  After this she began to decline, lost her appetite though not nauseated.  Recurrent difficulties with constipation.  Stools were black which she attributed to the iron.  Progressively short of breath and fatigue.  Ongoing vocal hoarseness, mild dysphagia since surgery.  Pt attributed black and stools to oral iron supplementation.  First truly melenic/burgundy/bloody stool occurred on 629.  Lab work 6/29 with  Hgb 7.4 (6.6 at recheck), was 9.8 on 6/4.   INR 1.7. Received PRBC x 5 (1 at [facility]).  Underwent EGD.   Hgb latest today is 8.7.  MCV consistently in 90s Rising BUN: 31 >> 39, was 16 to low 20s three and four weeks ago.  Creatinines remain stable, normal.    10/08/2018 EGD for hematochezia, melena.  Dr. Oneida Alar.  Diffuse erythema, friability, moderate inflammation throughout stomach.  Fresh blood and clots at pylorus.  Red blood in duodenal bulb.  Oozing ulcer at junction of D1/D2.  Unsuccessfully injected with epinephrine but continued to ooze.  Adherent clot present.  EGD aborted. Transferred to [facility]. 10/08/2018 CTA angio with emolization.  Dr. Laurence Ferrari encountered no active bleeding.  Performed prophylactic coil embolization of GDA.    After having multiple more burgundy/bloody stools yesterday, her latest 2 stools at 4 AM and 8 AM today are black, loose.  No stools since 8 AM.  No nausea.  She is somewhat hungry, has not eaten well in the past few days.  Patient never uses NSAIDs.  No ETOH.  She is never had problems with reflux type symptoms, abdominal pain, food intolerance, issues with her bowels  prior to recent weeks.  Fm hx colon cancer in dtr @ age 42.   Lives with her husband in [place].  2 dtrs, closest lives in [place].    Past Medical History:  Diagnosis Date   Aortic atherosclerosis (HCC)    Aortic insufficiency, rheumatic    Ascending aorta dilatation (HCC)    Atrial fibrillation ([place]) 05/10/2018   Colon polyps    Essential hypertension, benign    History of seasonal allergies    Hypertension    Hypothyroidism    Mitral stenosis with regurgitation, rheumatic    Osteopenia 2006   Persistent atrial fibrillation    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   21 mm [facility] Ease stented bovine pericardial tissue valve   S/P ascending aortic aneurysm repair 09/05/2018   28 mm Hemashield platinum supracoronary straight graft    S/P Maze operation for atrial fibrillation 09/05/2018   Complete bilateral atrial lesion set using bipolar radiofrequency and cryothermy ablation with clipping of LA appendage   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   29 mm [facility] Mitral stented bovine pericardial tissue valve   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   28 mm Edwards mc3 ring annuloplasty   Tricuspid regurgitation     Past Surgical History:  Procedure Laterality Date   AORTIC VALVE REPLACEMENT N/A 09/05/2018   Procedure: AORTIC VALVE REPLACEMENT (AVR) USING MAGNA EASE 21MM AOTIC BIOPROSTHESIS VALVE;  Surgeon: Rexene Alberts, MD;  Location: [facility] [facility];  Service: Open Heart Surgery;  Laterality: N/A;   APPENDECTOMY     CLIPPING OF ATRIAL APPENDAGE  09/05/2018   Procedure: Clipping Of Atrial Appendage;  Surgeon: Rexene Alberts, MD;  Location: [facility];  Service: Open Heart Surgery;;   COLONOSCOPY  03/23/2011   repeat in 5 years,Procedure: COLONOSCOPY;  Surgeon: Rogene Houston, MD;  [facility];  Service: Endoscopy;  Laterality: N/A;  1:00   COLONOSCOPY N/A 11/09/2016   Procedure: COLONOSCOPY;  Surgeon: Rogene Houston, MD;  [facility];  Service: Endoscopy;  Laterality: N/A;  1030   IR ANGIOGRAM SELECTIVE EACH ADDITIONAL VESSEL  10/08/2018   IR ANGIOGRAM VISCERAL SELECTIVE  10/08/2018   IR EMBO ART  VEN HEMORR LYMPH EXTRAV  INC GUIDE ROADMAPPING  10/08/2018   IR US GUIDE VASC ACCESS RIGHT  10/08/2018   MAZE N/A 09/05/2018   Procedure: MAZE;  Surgeon: Rexene Alberts, MD;  Location: [place];  Service: Open Heart Surgery;  Laterality: N/A;   MITRAL VALVE REPLACEMENT N/A 09/05/2018   Procedure: MITRAL VALVE (MV) REPLACEMENT USING MAGNA MITRAL EASE 29MM BIOPROSTHESIS VALVE;  Surgeon: Rexene Alberts, MD;  Location: [place];  Service: Open Heart Surgery;  Laterality: N/A;   REPLACEMENT ASCENDING AORTA N/A 09/05/2018   Procedure: SUPRACORONARY STRAIGHT GRAFT REPLACEMENT OF  ASCENDING AORTA;  Surgeon: Rexene Alberts, MD;  Location: [place];  Service: Open Heart Surgery;  Laterality: N/A;   RIGHT/LEFT HEART CATH AND CORONARY ANGIOGRAPHY N/A 08/29/2018   Procedure: RIGHT/LEFT HEART CATH AND CORONARY ANGIOGRAPHY;  Surgeon: Lorretta Harp, MD;  Location: [place] [facility];  Service: Cardiovascular;  Laterality: N/A;   TEE WITHOUT CARDIOVERSION N/A 08/29/2018   Procedure: TRANSESOPHAGEAL ECHOCARDIOGRAM (TEE);  Surgeon: Skeet Latch, MD;  Location: [place];  Service: Cardiovascular;  Laterality: N/A;   TRICUSPID VALVE REPLACEMENT N/A 09/05/2018   Procedure: TRICUSPID VALVE REPAIR USING EDWARDS MC3 TRICUSPID ANNULOPLASTY RING SIZE T28;  Surgeon: Rexene Alberts, MD;  Location:  [facility];  Service: Open Heart Surgery;  Laterality: N/A;   TUBAL LIGATION      Prior to Admission medications   Medication Sig Start Date End Date Taking? Authorizing Provider  aspirin EC 81 MG EC tablet Take 1 tablet (81 mg total) by mouth daily. 09/14/18  Yes Roddenberry, Myron G, PA-C  bisacodyl (DULCOLAX) 5 MG EC tablet Take 5 mg by mouth daily.   Yes [provider]  ferrous [id number] C-folic acid (TRINSICON / FOLTRIN) capsule Take 1 capsule by mouth daily with breakfast. 09/14/18  Yes Roddenberry, Myron G, PA-C  ondansetron (ZOFRAN) 4 MG tablet Take 1 tablet (4 mg total) by mouth every 8 (eight) hours as needed for nausea or vomiting. 09/23/18 09/23/19 Yes Gold, Wayne E, PA-C  traMADol (ULTRAM) 50 MG tablet Take 1 tablet (50 mg total) by mouth every 4 (four) hours as needed for moderate pain. 09/19/18  Yes Rexene Alberts, MD  warfarin (COUMADIN) 2.5 MG tablet Increase coumadin to 1 1/2 tablets daily Patient taking differently: Take 3.75 mg by mouth every evening.  10/01/18  Yes Herminio Commons, MD    Scheduled Meds:  sodium chloride   Intravenous Once   sodium chloride   Intravenous Once   sodium chloride   Intravenous Once   furosemide  20 mg  Intravenous Once   lidocaine  5 mL Mouth/Throat Once   sodium chloride flush  3 mL Intravenous Q12H   Infusions:  sodium chloride 125 mL/hr at 10/09/18 0858   pantoprozole (PROTONIX) infusion 8 mg/hr (10/09/18 1242)   potassium chloride 10 mEq (10/09/18 1245)   PRN Meds: acetaminophen **OR** acetaminophen   Allergies as of 10/07/2018 - Review Complete 10/07/2018  Allergen Reaction Noted   Bee venom Anaphylaxis 11/10/2010   Boniva [ibandronic acid] Nausea And Vomiting and Other (See Comments) 07/26/2012   Lisinopril Cough 07/26/2012   Latex Rash 11/10/2010    Family History  Problem Relation Age of Onset   Colon cancer Daughter        about 56    Social History   Socioeconomic History   Marital status: Married    Spouse name: Not on file   Number of children: Not on file   Years of education: Not on file   Highest education level: Not on file  Occupational History   Not on file  Social Needs   Financial resource strain: Not very hard   Food insecurity    Worry: Never true    Inability: Never true   Transportation needs    Medical: No    Non-medical: No  Tobacco Use   Smoking status: Former Smoker    Packs/day: 0.50    Years: 10.00    Pack years: 5.00   Smokeless tobacco: Never Used  Substance and Sexual Activity   Alcohol use: No   Drug use: No   Sexual activity: Not on file  Lifestyle   Physical activity    Days per week: 3 days    Minutes per session: 20 min   Stress: Only a little  Relationships   Social connections    Talks on phone: More than three times a week    Gets together: Three times a week    Attends religious service: 1 to 4 times per year    Active member of club or organization: No    Attends meetings of clubs or organizations: Never    Relationship status: Married   Intimate partner violence    Fear  of current or ex partner: No    Emotionally abused: No    Physically abused: No    Forced sexual  activity: No  Other Topics Concern   Not on file  Social History Narrative   Not on file    REVIEW OF SYSTEMS: Constitutional: Per HPI. ENT:  No nose bleeds Pulm: DOE as per HPI.  Prior to just before the surgery, she was quite active, garden, walk for exercise, volunteered with her church, help take care of her husband who is a bladder cancer survivor and in recent years required a lot of home nursing care. CV:  No palpitations, no LE edema.  Her sternum is still a bit sore when she moves around. GU:  No hematuria, no frequency GI: See HPI. Heme: Other than the GI associated bleeding, she denies unusual or aggressive bleeding/bruising. Transfusions: The HPI.  Before recent weeks, never required iron supplementation or blood product transfusion. Neuro:  No headaches, no peripheral tingling or numbness.  Syncope.  No seizures. Derm:  No itching, no rash or sores.  Endocrine:  No sweats or chills.  No polyuria or dysuria Immunization: Viewed.  She is up-to-date on multiple vaccines. Travel:  None beyond local counties in last few months.    PHYSICAL EXAM: Vital signs in last 24 hours: Vitals:   10/09/18 0517 10/09/18 0643  BP: (!) 72/61 (!) 84/58  Pulse: 94   Resp: 13   Temp: 98.9 F (37.2 C)   SpO2: 100%    Wt Readings from Last 3 Encounters:  10/09/18 50.3 kg  10/07/18 53.1 kg  09/23/18 51.7 kg    General: Very pleasant, looks well.  Alert and conversational Head: No facial asymmetry or swelling.  No signs of head trauma. Eyes: No scleral icterus.  No conjunctival pallor.  EOMI. Ears: Not hard of hearing. Nose: No congestion, no discharge. Mouth: Excellent teeth.  Oral mucosa pink, moist, clear.  Tongue midline.  Smile symmetric. Neck: No JVD, no masses, no thyromegaly. Lungs: Voice is hoarse, soft.  No labored breathing, no cough.  Lungs CTA bilaterally. Heart: RRR.  No audible valve clicks, murmurs, rubs, gallops.  S1, S2 present.  NSR at 90 on telemetry monitor.   Sternotomy scar intact, no visible inflammation  Abdomen: Soft.  Not tender.  Not distended.  Active bowel sounds.  No HSM, masses, bruits, hernias..   Rectal: Deferred rectal exam. Musc/Skeltl: No joint redness, swelling, gross deformity. Extremities: No CCE. Neurologic: Fully oriented x3.  Good historian.  Moves all 4 limbs without tremor or weakness. Skin: No rash, no sores, no telangiectasia. Tattoos: None. Nodes: No cervical adenopathy. Psych: Delightful, calm, cooperative.  Fluid speech.  Intake/Output from previous day: 06/30 0701 - 07/01 0700 In: 1831.8 [I.V.:684.8; Blood:1005; IV Piggyback:142] Out: 5809 [Urine:1600; Stool:1; Blood:20] Intake/Output this shift: Total I/O In: 0  Out: 150 [Urine:150]  LAB RESULTS: Recent Labs    10/08/18 1306 10/08/18 1447 10/09/18 0748  WBC 8.4 14.7* 10.0  HGB 7.2* 6.7* 8.7*  HCT 22.5* 20.8* 24.9*  PLT 192 173 138*   BMET Lab Results  Component Value Date   NA 141 10/09/2018   NA 141 10/08/2018   NA 141 10/07/2018   K 3.4 (L) 10/09/2018   K 4.0 10/08/2018   K 4.3 10/07/2018   CL 110 10/09/2018   CL 112 (H) 10/08/2018   CL 107 10/07/2018   CO2 20 (L) 10/09/2018   CO2 21 (L) 10/08/2018   CO2 23 10/07/2018   GLUCOSE  121 (H) 10/09/2018   GLUCOSE 101 (H) 10/08/2018   GLUCOSE 106 (H) 10/07/2018   BUN 39 (H) 10/09/2018   BUN 38 (H) 10/08/2018   BUN 33 (H) 10/07/2018   CREATININE 0.79 10/09/2018   CREATININE 0.46 10/08/2018   CREATININE 0.88 10/07/2018   CALCIUM 7.7 (L) 10/09/2018   CALCIUM 7.9 (L) 10/08/2018   CALCIUM 9.2 10/07/2018   LFT Recent Labs    10/07/18 1543 10/09/18 0527  PROT 7.5 4.8*  ALBUMIN 4.0 2.5*  AST 17 13*  ALT 15 12  ALKPHOS 74 40  BILITOT 0.4 0.7   PT/INR Lab Results  Component Value Date   INR 1.3 (H) 10/09/2018   INR 1.7 (H) 10/08/2018   INR 2.2 (H) 10/08/2018   Hepatitis Panel No results for input(s): HEPBSAG, HCVAB, HEPAIGM, HEPBIGM in the last 72 hours. C-Diff No components  found for: CDIFF Lipase     Component Value Date/Time   LIPASE 24 05/01/2018 1731    Drugs of Abuse  No results found for: LABOPIA, COCAINSCRNUR, LABBENZ, AMPHETMU, THCU, LABBARB   RADIOLOGY STUDIES: Ir Angiogram Visceral Selective  Result Date: 10/08/2018 INDICATION: 78 year old female with acutely bleeding refractory to endoscopic therapy. She presents for angiogram and embolization. EXAM: IR ULTRASOUND GUIDANCE VASC ACCESS RIGHT; IR EMBO ART VEN HEMORR LYMPH EXTRAV INC GUIDE ROADMAPPING; ADDITIONAL ARTERIOGRAPHY; SELECTIVE VISCERAL ARTERIOGRAPHY 1. Ultrasound-guided access right common femoral artery 2. Catheterization of the celiac artery with arteriogram 3. Catheterization gastroduodenal artery with arteriogram 4. Coil embolization of the gastroduodenal artery MEDICATIONS: 1 units packed red blood cells administered in interventional radiology ANESTHESIA/SEDATION: Moderate (conscious) sedation was employed during this procedure. A total of Versed 0.5 mg and Fentanyl 25 mcg was administered intravenously. Moderate Sedation Time: 41 minutes. The patient's level of consciousness and vital signs were monitored continuously by radiology nursing throughout the procedure under my direct supervision. CONTRAST:  7mL OMNIPAQUE IOHEXOL 300 MG/ML  SOLN FLUOROSCOPY TIME:  Fluoroscopy Time: 12 minutes 36 seconds (61 mGy). COMPLICATIONS: None immediate. PROCEDURE: Informed consent was obtained from the patient following explanation of the procedure, risks, benefits and alternatives. The patient understands, agrees and consents for the procedure. All questions were addressed. A time out was performed prior to the initiation of the procedure. Maximal barrier sterile technique utilized including caps, mask, sterile gowns, sterile gloves, large sterile drape, hand hygiene, and Betadine prep. The right common femoral artery was interrogated with ultrasound and found to be widely patent. An image was obtained and  stored for the medical record. Local anesthesia was attained by infiltration with 1% lidocaine. A small dermatotomy was made. Under real-time sonographic guidance, the vessel was punctured with a 21 gauge micropuncture needle. Using standard technique, the initial micro needle was exchanged over a 0.018 micro wire for a transitional 4 [place] micro sheath. The micro sheath was then exchanged over a 0.035 wire for a 5 French vascular sheath. A C2 cobra catheter was advanced over a Bentson wire into the abdominal aorta. C2 cobra catheter could just engage with the origin of the celiac artery. Celiac arteriography was performed. Anatomy appears conventional. No evidence of active bleeding. The C2 cobra catheter could not seat well in the artery origin secondary to steep angulation of the proximal artery. Therefore, the C2 cobra catheter was exchanged for a Sos Omni selective catheter. The SOS and Glidewire were then used to secure access to the celiac axis. A landed microcatheter was then navigated over a Fathom 16 wire in used to select the distal gastroduodenal  artery. Arteriography was performed. No evidence of active extravasation at this time. Given the patient's significant recent bleed and current anticoagulated status, the decision was made to proceed with prophylactic embolization of the gastroduodenal artery. Coil embolization was then performed using a combination of 3 and 4 mm Ruby detachable microcoils. Complete occlusion of the gastroduodenal artery was achieved. The catheters were removed. A limited right common femoral arteriogram demonstrates a high bifurcation. The access is good and within the short segment of common femoral artery and below the inguinal ligament. Hemostasis was attained with the assistance of an Angio-Seal closure device. IMPRESSION: 1. No active arterial extravasation at this time. 2. Prophylactic coil embolization of the gastroduodenal artery. Signed, Criselda Peaches, MD, RPVI  [facility] Radiology PLAN: Continue to monitor H and H and transfuse as needed. Electronically Signed   By: Jacqulynn Cadet M.D.   On: 10/08/2018 19:53   Ir Angiogram Selective Each Additional Vessel  Result Date: 10/08/2018 INDICATION: 78 year old female with acutely bleeding refractory to endoscopic therapy. She presents for angiogram and embolization. EXAM: IR ULTRASOUND GUIDANCE VASC ACCESS RIGHT; IR EMBO ART VEN HEMORR LYMPH EXTRAV INC GUIDE ROADMAPPING; ADDITIONAL ARTERIOGRAPHY; SELECTIVE VISCERAL ARTERIOGRAPHY 1. Ultrasound-guided access right common femoral artery 2. Catheterization of the celiac artery with arteriogram 3. Catheterization gastroduodenal artery with arteriogram 4. Coil embolization of the gastroduodenal artery MEDICATIONS: 1 units packed red blood cells administered in interventional radiology ANESTHESIA/SEDATION: Moderate (conscious) sedation was employed during this procedure. A total of Versed 0.5 mg and Fentanyl 25 mcg was administered intravenously. Moderate Sedation Time: 41 minutes. The patient's level of consciousness and vital signs were monitored continuously by radiology nursing throughout the procedure under my direct supervision. CONTRAST:  71mL OMNIPAQUE IOHEXOL 300 MG/ML  SOLN FLUOROSCOPY TIME:  Fluoroscopy Time: 12 minutes 36 seconds (61 mGy). COMPLICATIONS: None immediate. PROCEDURE: Informed consent was obtained from the patient following explanation of the procedure, risks, benefits and alternatives. The patient understands, agrees and consents for the procedure. All questions were addressed. A time out was performed prior to the initiation of the procedure. Maximal barrier sterile technique utilized including caps, mask, sterile gowns, sterile gloves, large sterile drape, hand hygiene, and Betadine prep. The right common femoral artery was interrogated with ultrasound and found to be widely patent. An image was  obtained and stored for the medical record. Local anesthesia was attained by infiltration with 1% lidocaine. A small dermatotomy was made. Under real-time sonographic guidance, the vessel was punctured with a 21 gauge micropuncture needle. Using standard technique, the initial micro needle was exchanged over a 0.018 micro wire for a transitional 4 [place] micro sheath. The micro sheath was then exchanged over a 0.035 wire for a 5 French vascular sheath. A C2 cobra catheter was advanced over a Bentson wire into the abdominal aorta. C2 cobra catheter could just engage with the origin of the celiac artery. Celiac arteriography was performed. Anatomy appears conventional. No evidence of active bleeding. The C2 cobra catheter could not seat well in the artery origin secondary to steep angulation of the proximal artery. Therefore, the C2 cobra catheter was exchanged for a Sos Omni selective catheter. The SOS and Glidewire were then used to secure access to the celiac axis. A landed microcatheter was then navigated over a Fathom 16 wire in used to select the distal gastroduodenal artery. Arteriography was performed. No evidence of active extravasation at this time. Given the patient's significant recent bleed and current anticoagulated status, the decision was  made to proceed with prophylactic embolization of the gastroduodenal artery. Coil embolization was then performed using a combination of 3 and 4 mm Ruby detachable microcoils. Complete occlusion of the gastroduodenal artery was achieved. The catheters were removed. A limited right common femoral arteriogram demonstrates a high bifurcation. The access is good and within the short segment of common femoral artery and below the inguinal ligament. Hemostasis was attained with the assistance of an Angio-Seal closure device. IMPRESSION: 1. No active arterial extravasation at this time. 2. Prophylactic coil embolization of the gastroduodenal artery. Signed, Criselda Peaches, MD, RPVI [facility] Radiology PLAN: Continue to monitor H and H and transfuse as needed. Electronically Signed   By: Jacqulynn Cadet M.D.   On: 10/08/2018 19:53   Ir US Guide Vasc Access Right  Result Date: 10/08/2018 INDICATION: 78 year old female with acutely bleeding refractory to endoscopic therapy. She presents for angiogram and embolization. EXAM: IR ULTRASOUND GUIDANCE VASC ACCESS RIGHT; IR EMBO ART VEN HEMORR LYMPH EXTRAV INC GUIDE ROADMAPPING; ADDITIONAL ARTERIOGRAPHY; SELECTIVE VISCERAL ARTERIOGRAPHY 1. Ultrasound-guided access right common femoral artery 2. Catheterization of the celiac artery with arteriogram 3. Catheterization gastroduodenal artery with arteriogram 4. Coil embolization of the gastroduodenal artery MEDICATIONS: 1 units packed red blood cells administered in interventional radiology ANESTHESIA/SEDATION: Moderate (conscious) sedation was employed during this procedure. A total of Versed 0.5 mg and Fentanyl 25 mcg was administered intravenously. Moderate Sedation Time: 41 minutes. The patient's level of consciousness and vital signs were monitored continuously by radiology nursing throughout the procedure under my direct supervision. CONTRAST:  19mL OMNIPAQUE IOHEXOL 300 MG/ML  SOLN FLUOROSCOPY TIME:  Fluoroscopy Time: 12 minutes 36 seconds (61 mGy). COMPLICATIONS: None immediate. PROCEDURE: Informed consent was obtained from the patient following explanation of the procedure, risks, benefits and alternatives. The patient understands, agrees and consents for the procedure. All questions were addressed. A time out was performed prior to the initiation of the procedure. Maximal barrier sterile technique utilized including caps, mask, sterile gowns, sterile gloves, large sterile drape, hand hygiene, and Betadine prep. The right common femoral artery was interrogated with ultrasound and found to be widely patent. An image was  obtained and stored for the medical record. Local anesthesia was attained by infiltration with 1% lidocaine. A small dermatotomy was made. Under real-time sonographic guidance, the vessel was punctured with a 21 gauge micropuncture needle. Using standard technique, the initial micro needle was exchanged over a 0.018 micro wire for a transitional 4 [place] micro sheath. The micro sheath was then exchanged over a 0.035 wire for a 5 French vascular sheath. A C2 cobra catheter was advanced over a Bentson wire into the abdominal aorta. C2 cobra catheter could just engage with the origin of the celiac artery. Celiac arteriography was performed. Anatomy appears conventional. No evidence of active bleeding. The C2 cobra catheter could not seat well in the artery origin secondary to steep angulation of the proximal artery. Therefore, the C2 cobra catheter was exchanged for a Sos Omni selective catheter. The SOS and Glidewire were then used to secure access to the celiac axis. A landed microcatheter was then navigated over a Fathom 16 wire in used to select the distal gastroduodenal artery. Arteriography was performed. No evidence of active extravasation at this time. Given the patient's significant recent bleed and current anticoagulated status, the decision was made to proceed with prophylactic embolization of the gastroduodenal artery. Coil embolization was then performed using a combination of 3 and 4 mm Ruby detachable  microcoils. Complete occlusion of the gastroduodenal artery was achieved. The catheters were removed. A limited right common femoral arteriogram demonstrates a high bifurcation. The access is good and within the short segment of common femoral artery and below the inguinal ligament. Hemostasis was attained with the assistance of an Angio-Seal closure device. IMPRESSION: 1. No active arterial extravasation at this time. 2. Prophylactic coil embolization of the gastroduodenal artery. Signed, Criselda Peaches, MD, RPVI [facility] Radiology PLAN: Continue to monitor H and H and transfuse as needed. Electronically Signed   By: Jacqulynn Cadet M.D.   On: 10/08/2018 19:53   [place] [facility] Road[facility]  Result Date: 10/08/2018 INDICATION: 78 year old female with acutely bleeding refractory to endoscopic therapy. She presents for angiogram and embolization. EXAM: IR ULTRASOUND GUIDANCE VASC ACCESS RIGHT; IR EMBO ART VEN HEMORR LYMPH EXTRAV INC GUIDE ROADMAPPING; ADDITIONAL ARTERIOGRAPHY; SELECTIVE VISCERAL ARTERIOGRAPHY 1. Ultrasound-guided access right common femoral artery 2. Catheterization of the celiac artery with arteriogram 3. Catheterization gastroduodenal artery with arteriogram 4. Coil embolization of the gastroduodenal artery MEDICATIONS: 1 units packed red blood cells administered in interventional radiology ANESTHESIA/SEDATION: Moderate (conscious) sedation was employed during this procedure. A total of Versed 0.5 mg and Fentanyl 25 mcg was administered intravenously. Moderate Sedation Time: 41 minutes. The patient's level of consciousness and vital signs were monitored continuously by radiology nursing throughout the procedure under my direct supervision. CONTRAST:  48mL OMNIPAQUE IOHEXOL 300 MG/ML  SOLN FLUOROSCOPY TIME:  Fluoroscopy Time: 12 minutes 36 seconds (61 mGy). COMPLICATIONS: None immediate. PROCEDURE: Informed consent was obtained from the patient following explanation of the procedure, risks, benefits and alternatives. The patient understands, agrees and consents for the procedure. All questions were addressed. A time out was performed prior to the initiation of the procedure. Maximal barrier sterile technique utilized including caps, mask, sterile gowns, sterile gloves, large sterile drape, hand hygiene, and Betadine prep. The right common femoral artery was interrogated with ultrasound and found to be  widely patent. An image was obtained and stored for the medical record. Local anesthesia was attained by infiltration with 1% lidocaine. A small dermatotomy was made. Under real-time sonographic guidance, the vessel was punctured with a 21 gauge micropuncture needle. Using standard technique, the initial micro needle was exchanged over a 0.018 micro wire for a transitional 4 [place] micro sheath. The micro sheath was then exchanged over a 0.035 wire for a 5 French vascular sheath. A C2 cobra catheter was advanced over a Bentson wire into the abdominal aorta. C2 cobra catheter could just engage with the origin of the celiac artery. Celiac arteriography was performed. Anatomy appears conventional. No evidence of active bleeding. The C2 cobra catheter could not seat well in the artery origin secondary to steep angulation of the proximal artery. Therefore, the C2 cobra catheter was exchanged for a Sos Omni selective catheter. The SOS and Glidewire were then used to secure access to the celiac axis. A landed microcatheter was then navigated over a Fathom 16 wire in used to select the distal gastroduodenal artery. Arteriography was performed. No evidence of active extravasation at this time. Given the patient's significant recent bleed and current anticoagulated status, the decision was made to proceed with prophylactic embolization of the gastroduodenal artery. Coil embolization was then performed using a combination of 3 and 4 mm Ruby detachable microcoils. Complete occlusion of the gastroduodenal artery was achieved. The catheters were removed. A limited right common femoral arteriogram  demonstrates a high bifurcation. The access is good and within the short segment of common femoral artery and below the inguinal ligament. Hemostasis was attained with the assistance of an Angio-Seal closure device. IMPRESSION: 1. No active arterial extravasation at this time. 2. Prophylactic coil embolization of the gastroduodenal  artery. Signed, Criselda Peaches, MD, RPVI [facility] Radiology PLAN: Continue to monitor H and H and transfuse as needed. Electronically Signed   By: Jacqulynn Cadet M.D.   On: 10/08/2018 19:53     IMPRESSION:   *   Upper GI bleed due to duodenal ulcer.  Unsuccessful attempt to inject with epinephrine at endoscopy yesterday by Dr. Oneida Alar.  Transferred to [facility] and underwent successful, prophylactic embolization of GDA No gastric acid suppression medications in use prior to this. Patient looks very well today.  Hopefully she is over the acute part of this.  *   ABL anemia.  Has received 5 PRBCs thus far.  *    Chronic anticoagulation for A. fib.  Previously on Eliquis but after recent multivalve surgery switched to Coumadin.  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 Fib.  Currently in NSR.      PLAN:     *   Observation.  Allow clear liquids.  *   Continue Protonix gtt for 72 hours then switch to twice daily scheduling, either IV or p.o depending on clinical course.  *   H&H at 5 PM tonight.  CBC in the morning.   Azucena Freed  10/09/2018, 1:26 PM Phone [phone]    I have reviewed the entire case in detail with the above APP and discussed the plan in detail.  Therefore, I agree with the diagnoses recorded above. In addition,  I have personally interviewed and examined the patient and have personally reviewed the upper endoscopy and interventional radiology reports of yesterday.  My additional thoughts are as follows:  Duodenal bulb bleeding source, probable ulcer, though not well visualized due to the degree of ongoing bleeding.  I wonder if this ulcer could have left as a stress ulceration around the time of major cardiac surgery, then bled due to anticoagulant therapy.  She is reportedly much improved today, has no abdominal pain or nausea or vomiting.  She reports less lethargy.  She denies chest pain  or dyspnea right now.  Currently just on clear liquids and will remain that way until at least tomorrow.  Anticoagulation is off and INR down to 1.3 today.  She passed some black stool as recently as 8 this morning.  Hemoglobin dropped from 8.7 this morning to 7.5 early afternoon.  I am hopeful that that is equilibration, though she could certainly have ongoing bleeding as well.  Blood pressure 90/60 heart rate of 90 at the time of my evaluation.  Her hemoglobin and hematocrit will be watched closely and transfuse as needed.  We will keep her just on clears in case there appears to be ongoing bleeding and repeat upper endoscopy is warranted tomorrow. If the cardiology service feels strongly about resuming anticoagulation, then she will need a repeat upper endoscopy in the near future as an outpatient with the [facility] for risk assessment.  Patient should remain on Protonix drip for now, then will be transferred to twice daily oral Protonix by the time of discharge.   Nelida Meuse III Office:[phone]

## 2018-10-09 NOTE — Progress Notes (Addendum)
Progress Note  Patient Name: Hannah Roy Date of Encounter: 10/09/2018  Primary Cardiologist: Kate Sable, MD   Subjective   No chest pain, no SOB, still with diarrhea 2 large BMs loose this AM   Inpatient Medications    Scheduled Meds:  sodium chloride   Intravenous Once   sodium chloride   Intravenous Once   sodium chloride   Intravenous Once   furosemide  20 mg Intravenous Once   lidocaine  5 mL Mouth/Throat Once   sodium chloride flush  3 mL Intravenous Q12H   Continuous Infusions:  pantoprozole (PROTONIX) infusion 8 mg/hr (10/09/18 0100)   PRN Meds: acetaminophen **OR** acetaminophen   Vital Signs    Vitals:   10/09/18 0105 10/09/18 0411 10/09/18 0517 10/09/18 0643  BP:   (!) 72/61 (!) 84/58  Pulse:   94   Resp:   13   Temp: 98.7 F (37.1 C)  98.9 F (37.2 C)   TempSrc: Oral  Oral   SpO2:   100%   Weight:  50.3 kg      Intake/Output Summary (Last 24 hours) at 10/09/2018 0721 Last data filed at 10/09/2018 0400 Gross per 24 hour  Intake 1831.8 ml  Output 1621 ml  Net 210.8 ml   Last 3 Weights 10/09/2018 10/07/2018 09/23/2018  Weight (lbs) 110 lb 14.3 oz 117 lb 114 lb  Weight (kg) 50.3 kg 53.071 kg 51.71 kg      Telemetry    SR  - Personally Reviewed  ECG    10/07/18  SR RBBB and no ST changes from prior EKG, abnormal due to RBBB - Personally Reviewed  Physical Exam   GEN: No acute distress.   Neck: No JVD Cardiac: RRR, no murmurs, rubs, +S4 gallop, chest wall incision is healing well. Nor infection.  Respiratory: Clear to auscultation bilaterally. GI: Soft, nontender, non-distended  MS: No edema; No deformity. Neuro:  Nonfocal  Psych: Normal affect   Labs    High Sensitivity Troponin:  No results for input(s): TROPONINIHS in the last 720 hours.    Cardiac EnzymesNo results for input(s): TROPONINI in the last 168 hours. No results for input(s): TROPIPOC in the last 168 hours.   Chemistry Recent Labs  Lab 10/07/18 1543  10/08/18 0421 10/09/18 0527  NA 141 141 141  K 4.3 4.0 3.4*  CL 107 112* 110  CO2 23 21* 20*  GLUCOSE 106* 101* 121*  BUN 33* 38* 39*  CREATININE 0.88 0.46 0.79  CALCIUM 9.2 7.9* 7.7*  PROT 7.5  --  4.8*  ALBUMIN 4.0  --  2.5*  AST 17  --  13*  ALT 15  --  12  ALKPHOS 74  --  40  BILITOT 0.4  --  0.7  GFRNONAA >60 >60 >60  GFRAA >60 >60 >60  ANIONGAP 11 8 11      Hematology Recent Labs  Lab 10/08/18 0421 10/08/18 1306 10/08/18 1447  WBC 6.2 8.4 14.7*  RBC 2.20* 2.39* 2.26*  HGB 6.6* 7.2* 6.7*  HCT 20.8* 22.5* 20.8*  MCV 94.5 94.1 92.0  MCH 30.0 30.1 29.6  MCHC 31.7 32.0 32.2  RDW 15.7* 16.9* 17.1*  PLT 216 192 173    BNPNo results for input(s): BNP, PROBNP in the last 168 hours.   DDimer No results for input(s): DDIMER in the last 168 hours.   Radiology    Ir Angiogram Visceral Selective  Result Date: 10/08/2018 INDICATION: 78 year old female with acutely bleeding refractory to endoscopic therapy.  She presents for angiogram and embolization. EXAM: IR ULTRASOUND GUIDANCE VASC ACCESS RIGHT; IR EMBO ART VEN HEMORR LYMPH EXTRAV INC GUIDE ROADMAPPING; ADDITIONAL ARTERIOGRAPHY; SELECTIVE VISCERAL ARTERIOGRAPHY 1. Ultrasound-guided access right common femoral artery 2. Catheterization of the celiac artery with arteriogram 3. Catheterization gastroduodenal artery with arteriogram 4. Coil embolization of the gastroduodenal artery MEDICATIONS: 1 units packed red blood cells administered in interventional radiology ANESTHESIA/SEDATION: Moderate (conscious) sedation was employed during this procedure. A total of Versed 0.5 mg and Fentanyl 25 mcg was administered intravenously. Moderate Sedation Time: 41 minutes. The patient's level of consciousness and vital signs were monitored continuously by radiology nursing throughout the procedure under my direct supervision. CONTRAST:  9mL OMNIPAQUE IOHEXOL 300 MG/ML  SOLN FLUOROSCOPY TIME:  Fluoroscopy Time: 12 minutes 36 seconds (61 mGy).  COMPLICATIONS: None immediate. PROCEDURE: Informed consent was obtained from the patient following explanation of the procedure, risks, benefits and alternatives. The patient understands, agrees and consents for the procedure. All questions were addressed. A time out was performed prior to the initiation of the procedure. Maximal barrier sterile technique utilized including caps, mask, sterile gowns, sterile gloves, large sterile drape, hand hygiene, and Betadine prep. The right common femoral artery was interrogated with ultrasound and found to be widely patent. An image was obtained and stored for the medical record. Local anesthesia was attained by infiltration with 1% lidocaine. A small dermatotomy was made. Under real-time sonographic guidance, the vessel was punctured with a 21 gauge micropuncture needle. Using standard technique, the initial micro needle was exchanged over a 0.018 micro wire for a transitional 4 [place] micro sheath. The micro sheath was then exchanged over a 0.035 wire for a 5 French vascular sheath. A C2 cobra catheter was advanced over a Bentson wire into the abdominal aorta. C2 cobra catheter could just engage with the origin of the celiac artery. Celiac arteriography was performed. Anatomy appears conventional. No evidence of active bleeding. The C2 cobra catheter could not seat well in the artery origin secondary to steep angulation of the proximal artery. Therefore, the C2 cobra catheter was exchanged for a Sos Omni selective catheter. The SOS and Glidewire were then used to secure access to the celiac axis. A landed microcatheter was then navigated over a Fathom 16 wire in used to select the distal gastroduodenal artery. Arteriography was performed. No evidence of active extravasation at this time. Given the patient's significant recent bleed and current anticoagulated status, the decision was made to proceed with prophylactic embolization of the gastroduodenal artery. Coil  embolization was then performed using a combination of 3 and 4 mm Ruby detachable microcoils. Complete occlusion of the gastroduodenal artery was achieved. The catheters were removed. A limited right common femoral arteriogram demonstrates a high bifurcation. The access is good and within the short segment of common femoral artery and below the inguinal ligament. Hemostasis was attained with the assistance of an Angio-Seal closure device. IMPRESSION: 1. No active arterial extravasation at this time. 2. Prophylactic coil embolization of the gastroduodenal artery. Signed, Criselda Peaches, MD, RPVI [facility] Radiology PLAN: Continue to monitor H and H and transfuse as needed. Electronically Signed   By: Jacqulynn Cadet M.D.   On: 10/08/2018 19:53   Ir Angiogram Selective Each Additional Vessel  Result Date: 10/08/2018 INDICATION: 78 year old female with acutely bleeding refractory to endoscopic therapy. She presents for angiogram and embolization. EXAM: IR ULTRASOUND GUIDANCE VASC ACCESS RIGHT; IR EMBO ART VEN HEMORR LYMPH EXTRAV INC GUIDE ROADMAPPING; ADDITIONAL ARTERIOGRAPHY;  SELECTIVE VISCERAL ARTERIOGRAPHY 1. Ultrasound-guided access right common femoral artery 2. Catheterization of the celiac artery with arteriogram 3. Catheterization gastroduodenal artery with arteriogram 4. Coil embolization of the gastroduodenal artery MEDICATIONS: 1 units packed red blood cells administered in interventional radiology ANESTHESIA/SEDATION: Moderate (conscious) sedation was employed during this procedure. A total of Versed 0.5 mg and Fentanyl 25 mcg was administered intravenously. Moderate Sedation Time: 41 minutes. The patient's level of consciousness and vital signs were monitored continuously by radiology nursing throughout the procedure under my direct supervision. CONTRAST:  41mL OMNIPAQUE IOHEXOL 300 MG/ML  SOLN FLUOROSCOPY TIME:  Fluoroscopy Time: 12 minutes 36  seconds (61 mGy). COMPLICATIONS: None immediate. PROCEDURE: Informed consent was obtained from the patient following explanation of the procedure, risks, benefits and alternatives. The patient understands, agrees and consents for the procedure. All questions were addressed. A time out was performed prior to the initiation of the procedure. Maximal barrier sterile technique utilized including caps, mask, sterile gowns, sterile gloves, large sterile drape, hand hygiene, and Betadine prep. The right common femoral artery was interrogated with ultrasound and found to be widely patent. An image was obtained and stored for the medical record. Local anesthesia was attained by infiltration with 1% lidocaine. A small dermatotomy was made. Under real-time sonographic guidance, the vessel was punctured with a 21 gauge micropuncture needle. Using standard technique, the initial micro needle was exchanged over a 0.018 micro wire for a transitional 4 [place] micro sheath. The micro sheath was then exchanged over a 0.035 wire for a 5 French vascular sheath. A C2 cobra catheter was advanced over a Bentson wire into the abdominal aorta. C2 cobra catheter could just engage with the origin of the celiac artery. Celiac arteriography was performed. Anatomy appears conventional. No evidence of active bleeding. The C2 cobra catheter could not seat well in the artery origin secondary to steep angulation of the proximal artery. Therefore, the C2 cobra catheter was exchanged for a Sos Omni selective catheter. The SOS and Glidewire were then used to secure access to the celiac axis. A landed microcatheter was then navigated over a Fathom 16 wire in used to select the distal gastroduodenal artery. Arteriography was performed. No evidence of active extravasation at this time. Given the patient's significant recent bleed and current anticoagulated status, the decision was made to proceed with prophylactic embolization of the gastroduodenal  artery. Coil embolization was then performed using a combination of 3 and 4 mm Ruby detachable microcoils. Complete occlusion of the gastroduodenal artery was achieved. The catheters were removed. A limited right common femoral arteriogram demonstrates a high bifurcation. The access is good and within the short segment of common femoral artery and below the inguinal ligament. Hemostasis was attained with the assistance of an Angio-Seal closure device. IMPRESSION: 1. No active arterial extravasation at this time. 2. Prophylactic coil embolization of the gastroduodenal artery. Signed, Criselda Peaches, MD, RPVI [facility] Radiology PLAN: Continue to monitor H and H and transfuse as needed. Electronically Signed   By: Jacqulynn Cadet M.D.   On: 10/08/2018 19:53   Ir US Guide Vasc Access Right  Result Date: 10/08/2018 INDICATION: 78 year old female with acutely bleeding refractory to endoscopic therapy. She presents for angiogram and embolization. EXAM: IR ULTRASOUND GUIDANCE VASC ACCESS RIGHT; IR EMBO ART VEN HEMORR LYMPH EXTRAV INC GUIDE ROADMAPPING; ADDITIONAL ARTERIOGRAPHY; SELECTIVE VISCERAL ARTERIOGRAPHY 1. Ultrasound-guided access right common femoral artery 2. Catheterization of the celiac artery with arteriogram 3. Catheterization gastroduodenal artery with arteriogram 4.  Coil embolization of the gastroduodenal artery MEDICATIONS: 1 units packed red blood cells administered in interventional radiology ANESTHESIA/SEDATION: Moderate (conscious) sedation was employed during this procedure. A total of Versed 0.5 mg and Fentanyl 25 mcg was administered intravenously. Moderate Sedation Time: 41 minutes. The patient's level of consciousness and vital signs were monitored continuously by radiology nursing throughout the procedure under my direct supervision. CONTRAST:  1mL OMNIPAQUE IOHEXOL 300 MG/ML  SOLN FLUOROSCOPY TIME:  Fluoroscopy Time: 12 minutes 36  seconds (61 mGy). COMPLICATIONS: None immediate. PROCEDURE: Informed consent was obtained from the patient following explanation of the procedure, risks, benefits and alternatives. The patient understands, agrees and consents for the procedure. All questions were addressed. A time out was performed prior to the initiation of the procedure. Maximal barrier sterile technique utilized including caps, mask, sterile gowns, sterile gloves, large sterile drape, hand hygiene, and Betadine prep. The right common femoral artery was interrogated with ultrasound and found to be widely patent. An image was obtained and stored for the medical record. Local anesthesia was attained by infiltration with 1% lidocaine. A small dermatotomy was made. Under real-time sonographic guidance, the vessel was punctured with a 21 gauge micropuncture needle. Using standard technique, the initial micro needle was exchanged over a 0.018 micro wire for a transitional 4 [place] micro sheath. The micro sheath was then exchanged over a 0.035 wire for a 5 French vascular sheath. A C2 cobra catheter was advanced over a Bentson wire into the abdominal aorta. C2 cobra catheter could just engage with the origin of the celiac artery. Celiac arteriography was performed. Anatomy appears conventional. No evidence of active bleeding. The C2 cobra catheter could not seat well in the artery origin secondary to steep angulation of the proximal artery. Therefore, the C2 cobra catheter was exchanged for a Sos Omni selective catheter. The SOS and Glidewire were then used to secure access to the celiac axis. A landed microcatheter was then navigated over a Fathom 16 wire in used to select the distal gastroduodenal artery. Arteriography was performed. No evidence of active extravasation at this time. Given the patient's significant recent bleed and current anticoagulated status, the decision was made to proceed with prophylactic embolization of the gastroduodenal  artery. Coil embolization was then performed using a combination of 3 and 4 mm Ruby detachable microcoils. Complete occlusion of the gastroduodenal artery was achieved. The catheters were removed. A limited right common femoral arteriogram demonstrates a high bifurcation. The access is good and within the short segment of common femoral artery and below the inguinal ligament. Hemostasis was attained with the assistance of an Angio-Seal closure device. IMPRESSION: 1. No active arterial extravasation at this time. 2. Prophylactic coil embolization of the gastroduodenal artery. Signed, Criselda Peaches, MD, RPVI [facility] Radiology PLAN: Continue to monitor H and H and transfuse as needed. Electronically Signed   By: Jacqulynn Cadet M.D.   On: 10/08/2018 19:53   [place] Guide Roadmapping  Result Date: 10/08/2018 INDICATION: 78 year old female with acutely bleeding refractory to endoscopic therapy. She presents for angiogram and embolization. EXAM: IR ULTRASOUND GUIDANCE VASC ACCESS RIGHT; IR EMBO ART VEN HEMORR LYMPH EXTRAV INC GUIDE ROADMAPPING; ADDITIONAL ARTERIOGRAPHY; SELECTIVE VISCERAL ARTERIOGRAPHY 1. Ultrasound-guided access right common femoral artery 2. Catheterization of the celiac artery with arteriogram 3. Catheterization gastroduodenal artery with arteriogram 4. Coil embolization of the gastroduodenal artery MEDICATIONS: 1 units packed red blood cells administered in interventional radiology ANESTHESIA/SEDATION: Moderate (  conscious) sedation was employed during this procedure. A total of Versed 0.5 mg and Fentanyl 25 mcg was administered intravenously. Moderate Sedation Time: 41 minutes. The patient's level of consciousness and vital signs were monitored continuously by radiology nursing throughout the procedure under my direct supervision. CONTRAST:  44mL OMNIPAQUE IOHEXOL 300 MG/ML  SOLN FLUOROSCOPY TIME:   Fluoroscopy Time: 12 minutes 36 seconds (61 mGy). COMPLICATIONS: None immediate. PROCEDURE: Informed consent was obtained from the patient following explanation of the procedure, risks, benefits and alternatives. The patient understands, agrees and consents for the procedure. All questions were addressed. A time out was performed prior to the initiation of the procedure. Maximal barrier sterile technique utilized including caps, mask, sterile gowns, sterile gloves, large sterile drape, hand hygiene, and Betadine prep. The right common femoral artery was interrogated with ultrasound and found to be widely patent. An image was obtained and stored for the medical record. Local anesthesia was attained by infiltration with 1% lidocaine. A small dermatotomy was made. Under real-time sonographic guidance, the vessel was punctured with a 21 gauge micropuncture needle. Using standard technique, the initial micro needle was exchanged over a 0.018 micro wire for a transitional 4 [place] micro sheath. The micro sheath was then exchanged over a 0.035 wire for a 5 French vascular sheath. A C2 cobra catheter was advanced over a Bentson wire into the abdominal aorta. C2 cobra catheter could just engage with the origin of the celiac artery. Celiac arteriography was performed. Anatomy appears conventional. No evidence of active bleeding. The C2 cobra catheter could not seat well in the artery origin secondary to steep angulation of the proximal artery. Therefore, the C2 cobra catheter was exchanged for a Sos Omni selective catheter. The SOS and Glidewire were then used to secure access to the celiac axis. A landed microcatheter was then navigated over a Fathom 16 wire in used to select the distal gastroduodenal artery. Arteriography was performed. No evidence of active extravasation at this time. Given the patient's significant recent bleed and current anticoagulated status, the decision was made to proceed with prophylactic  embolization of the gastroduodenal artery. Coil embolization was then performed using a combination of 3 and 4 mm Ruby detachable microcoils. Complete occlusion of the gastroduodenal artery was achieved. The catheters were removed. A limited right common femoral arteriogram demonstrates a high bifurcation. The access is good and within the short segment of common femoral artery and below the inguinal ligament. Hemostasis was attained with the assistance of an Angio-Seal closure device. IMPRESSION: 1. No active arterial extravasation at this time. 2. Prophylactic coil embolization of the gastroduodenal artery. Signed, Criselda Peaches, MD, RPVI [facility] Special[facility] Radiology PLAN: Continue to monitor H and H and transfuse as needed. Electronically Signed   By: Jacqulynn Cadet M.D.   On: 10/08/2018 19:53    Cardiac Studies   Echo 10/08/18  IMPRESSIONS    1. The left ventricle has normal systolic function with an ejection fraction of 60-65%. The cavity size was normal. There is moderately increased left ventricular wall thickness. Left ventricular diastolic Doppler parameters are consistent with  restrictive filling. No evidence of left ventricular regional wall motion abnormalities.  2. Left atrial size was mildly dilated.  3. A 29 mm [facility] Mitral bovine bioprosthetic valve is present in the mitral position. No significant mitral regurgitation. Valve function grossly normal with normal mean gradient.  4. A 80mm Edwards Magna Ease bovine bioprosthesis is present in the aortic position. No perivalvular leak  noted. Sinuses of Valsalva seen with normal aortic root size. LVOT is small. valve function is grossly normal with normal mean gradient. No aortic  regurgitation.  5. The right ventricle has normal systolic function. The cavity was normal. There is no increase in right ventricular wall thickness. Right ventricular systolic pressure is normal with an  estimated pressure of 21.7 mmHg.  6. The aortic root is normal in size and structure.  7. 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 Status post tricuspid valve repair. There is mild tricuspid regurgitation.  9. Moderate pericardial effusion. 10. The pericardial effusion is circumferential.  FINDINGS  Left Ventricle: The left ventricle has normal systolic function, with an ejection fraction of 60-65%. The cavity size was normal. There is moderately increased left ventricular wall thickness. Left ventricular diastolic Doppler parameters are consistent  with restrictive filling. No evidence of left ventricular regional wall motion abnormalities..  Right Ventricle: The right ventricle has normal systolic function. The cavity was normal. There is no increase in right ventricular wall thickness. Right ventricular systolic pressure is normal with an estimated pressure of 28.7 mmHg.  Left Atrium: Left atrial size was mildly dilated.  Right Atrium: Right atrial size was normal in size. Right atrial pressure is estimated at 10 mmHg.  Interatrial Septum: No atrial level shunt detected by color flow Doppler.  Pericardium: A moderately sized pericardial effusion is present. The pericardial effusion is circumferential.  Mitral Valve: The mitral valve has been repaired/replaced. Mitral valve regurgitation is not visualized by color flow Doppler. A 29 mm [facility] Mitral bioprosthetic valve is present in the mitral position.  Tricuspid Valve: The tricuspid valve is rheumatic. Tricuspid valve regurgitation is mild by color flow Doppler. The tricuspid valve is status post repair with an annuloplasty ring. valve is present in the tricuspid position.  Aortic Valve: The aortic valve has been repaired/replaced Aortic valve regurgitation was not visualized by color flow Doppler. A 20mm Edwards Magna Ease bovine aortic valve bioprosthesis valve is present in the aortic position.  Pulmonic Valve: The  pulmonic valve was not well visualized. Pulmonic valve regurgitation is trivial by color flow Doppler.  Aorta: The aortic root is normal in size and structure.  Venous: The inferior vena cava is normal in size with greater than 50% respiratory variability.   Patient Profile     78 y.o. 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  Thought to be in a fib RVR but was SR.   Also with CHB post op and BB and amiodarone were stopped.  D/c'd on coumadin.    This admit Hgb 6.6 and transfused 1 unit.    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 Roxy Manns, discharged on coumadin.  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   --  s/p Maze  --now admitted acute GI bleed maintaining SR --check EKG   Acute GI bleed due to duodenal ulcer on IV protonix. --Hgb 6.6 rec'd 2 UPRBCs and had successful coil embolization of GDA. --Hgb today 8.7 plts 138- continues to drop  --IM and GI following  Anticoagulation was on coumadin.  On arrival INR  2.2, then 1.7 and today 1.3 ok per Dr. Roxy Manns to hold coumadin for now.  With bioprosthetic valve replacements.  Held for ongoing bleeding.   No need for heparin crossover --she did receive Vit K 2.5 X 1  Moderate size pericardial effusion.   .       For questions or updates, please contact [facility] Please consult [email or web address] for contact info under        Signed, Cecilie Kicks, NP  10/09/2018, 7:21 AM   ---------------------------------------------------------------------------------------------   History and all data above reviewed.  Patient examined.  I agree with the findings as above.  Hannah Roy is a very pleasant 78 yo female who we were consulted to help with management of afib RVR. In addition, she has recently had aortic and mitral valve replacement with ascending aorta repair for rheumatic heart disease. She presented with nausea, fatigue, black stools and Hb 7.4.   Constitutional: No acute distress ENMT: normal dentition, moist mucous membranes Cardiovascular: regular rhythm, normal rate, no murmurs. S1 and S2 normal. Radial pulses normal bilaterally. No jugular venous distention.  Respiratory: clear to auscultation bilaterally GI : normal bowel sounds, soft and nontender. No distention.   MSK: extremities warm, well perfused. No edema.  NEURO: grossly nonfocal exam, moves all extremities. PSYCH: alert and oriented x 3, normal mood and affect.   All available labs, radiology testing, previous records reviewed. 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 Can restart when safe from GI perspective.   PAF - in sinus rhythm, monitor closely.   Moderate pericardial effusion - does not appear to have tamponade physiology by echo  or physical exam. May need follow up echo in 1 week to ensure no change. Will monitor closely given recent surgery.   Time Spent Directly with Patient:  I have spent a total of 35 minutes with the patient reviewing hospital notes, telemetry, EKGs, labs and examining the patient as well as establishing an assessment and plan that was discussed personally with the patient.  > 50% of time was spent in direct patient care.  Length of Stay:  LOS: 2 days   Elouise Munroe, MD [facility] 10/09/2018

## 2018-10-09 NOTE — Progress Notes (Addendum)
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 Roxy Manns , dced from [facility] on 09/13/18, he admitted to [facility] on 10/07/2018 with dizziness, dyspnea and melena transferred back to [facility] on 10/08/2018 due to duodenal ulcer requiring interventional radiology to embolize  6/30 patient underwent successful embolization of GDA  Subjective: Bp in 80s, she reports she runs low in 90s " something like that). Had BM 4 am and it was black and some red. HB is up in 8s post transfusion this am. Denies abd pain, nausea, vomiting, CP About to use bedside commode.  Assessment & Plan:  ABLA from bleeding duodenal ulcer: Baseline hemoglobin around 12 G gm in 08/2018. HB improved this am, total 4 unit PRBC transfused. Monitor h/h. Recent Labs  Lab 10/07/18 1543 10/08/18 0421 10/08/18 1306 10/08/18 1447 10/09/18 0748  HGB 7.6* 6.6* 7.2* 6.7* 8.7*  HCT 24.5* 20.8* 22.5* 20.8* 24.9*   Actively bleeding duodenal ulcer: Dr Joesph Fillers dcussed with GI at [facility], Howie Ill at [facility]/ IR at [facility]- (Dr.Wagner)/Cardiology Dr. Domenic Polite (at [facility]) and CT Surgery Dr Remus Loffler- transferred to [facility] 6/30 and s/p successful embolization of GDA.  Reports having black stool today morning.  Hemoglobin has improved, continue on Protonix drip.  Diet as per gastroenterology. Added ivf.  Hypotension: Blood  pressure soft patient reports her blood pressure usually in 32I systolic.  This morning in 70s, improved in the 80s, added IV fluids.She was able to get up and use the bedside commode without issues.  Can use bolus IV fluids if symptomatic or BP further low, will continue to trend H&H and transfuse if needed.  Mild hypokalemia:replete IV.  Rheumatic valvular heart disease status post aortic valve replacement with by bioprosthetic tissue valve 08/28/2026: Per Dr. Roxy Manns --no need for heparin bridge and okay to continue to hold anticoagulation patient has bioprosthetic bovine valve not mechanical valve  Ascending thoracic aorta aneurysm: recently repaired by Dr.Owen on 09/05/2018, repeat echo 6/30 no significant findings   PAF s/p Maze Procedure on 09/05/2018:currently in sinus rhythm,  Coumadin on hold due to GI bleed, INR is down to 1.3, status post vitamin K 2.51.Currently not on AV nodal blocking agent due to recent episode of type II AV block, and also with hypotension.  Cardiology following closely and appreciate inputs.  Chronic anticoagulation,previously on Eliquis for A. fib, post cardiac surgery she was placed on Coumadin, INR   was 2.2,s/p vitamin K 2.5 mg x 1, INR down to 1.3. AC on hold for now.   DVT prophylaxis: SCD Code Status: FULL Family Communication: DISCUSSED W PATIENT. Disposition Plan: remains inpatient pending clinical improvement. Addendum: I called patient's husband and discussed plan of care in detail. Patient was also reevaluated in the afternoon blood pressure has improved, she is on clears and doing well.  Consultants:  Gi (Dr Oneida Alar at [facility]) and Howie Ill at [employer] IR at [facility]- (Dr.Wagner)/Cardiology Dr. Domenic Polite (at [facility]) and CT Surgery Dr C. Roxy Manns at [facility]  Procedures:  10/08/18  EGD: Impression: - UGI BLEED DUE TO ULCER AT JUNCTION D1/D2, NOT AMENABLE TO ENDOSCOPIC THERAPY- H PYLORI STATUS UNKNOWN  6/30:Status post IR embolization of GD.   ECHO  10/08/18  1. The left ventricle has normal systolic function with an ejection fraction of 60-65%. The cavity size was normal. There is moderately increased left ventricular wall thickness. Left ventricular diastolic Doppler parameters are consistent with  restrictive filling. No evidence of left ventricular regional wall motion abnormalities. 2. Left atrial size was mildly dilated. 3. A 29 mm [facility] Mitral bovine bioprosthetic valve is present in the mitral position. No significant mitral regurgitation. Valve function grossly normal with normal mean gradient. 4. A 19mm Edwards Magna Ease bovine bioprosthesis is present in the aortic position. No perivalvular leak noted. Sinuses of Valsalva seen with normal aortic root size. LVOT is small. valve function is grossly normal with normal mean gradient. No aortic  regurgitation. 5. The right ventricle has normal systolic function. The cavity was normal. There is no increase in right ventricular wall thickness. Right ventricular systolic pressure is normal with an estimated pressure of 21.7 mmHg. 6. The aortic root is normal in size and structure. 7. 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 Status post tricuspid valve repair. There is mild tricuspid regurgitation. 9. Moderate pericardial effusion. 10. The pericardial effusion is circumferential   Antimicrobials: Anti-infectives (From admission, onward)   None       Objective: Vitals:   10/09/18 0105 10/09/18 0411 10/09/18 0517 10/09/18 0643  BP:   (!) 72/61 (!) 84/58  Pulse:   94   Resp:   13   Temp: 98.7 F (37.1 C)  98.9 F (37.2 C)   TempSrc: Oral  Oral   SpO2:   100%   Weight:  50.3 kg      Intake/Output Summary (Last 24 hours) at 10/09/2018 0816 Last data filed at 10/09/2018 0400 Gross per 24 hour  Intake 1805.02 ml  Output 1621 ml  Net 184.02 ml   Filed Weights   10/09/18 0411  Weight: 50.3 kg   Weight change:   Body mass index is 20.95 kg/m.  Intake/Output from  previous day: 06/30 0701 - 07/01 0700 In: 1831.8 [I.V.:684.8; Blood:1005; IV Piggyback:142] Out: 3790 [Urine:1600; Stool:1; Blood:20] Intake/Output this shift: No intake/output data recorded.  Examination:  General exam: Appears calm and comfortable, HEENT:PERRL,Oral mucosa moist, Ear/Nose normal on gross exam Respiratory system: chest w recent scar healing. Bilateral equal air entry, normal vesicular breath sounds, no wheezes or crackles  Cardiovascular system: S1 & S2 heard,No JVD, murmurs. Gastrointestinal system: Abdomen is  soft, non tender, non distended, BS +  Nervous System:Alert and oriented. No focal neurological deficits/moving extremities, sensation intact. Extremities: No edema, no clubbing, distal peripheral pulses palpable. Skin: No rashes, lesions, no icterus MSK: Normal muscle bulk,tone ,power  Medications:  Scheduled Meds:  sodium chloride   Intravenous Once   sodium chloride   Intravenous Once   sodium chloride   Intravenous Once   furosemide  20 mg Intravenous Once   lidocaine  5 mL Mouth/Throat Once   sodium chloride flush  3 mL Intravenous Q12H   Continuous Infusions:  pantoprozole (PROTONIX) infusion 8 mg/hr (10/09/18 0100)    Data Reviewed: I have personally reviewed following labs and imaging studies  CBC: Recent Labs  Lab 10/07/18 1543 10/08/18 0421 10/08/18 1306 10/08/18 1447 10/09/18 0748  WBC 7.0 6.2 8.4 14.7* 10.0  HGB 7.6* 6.6* 7.2* 6.7* 8.7*  HCT 24.5*  20.8* 22.5* 20.8* 24.9*  MCV 100.0 94.5 94.1 92.0 86.5  PLT 306 216 192 173 696*   Basic Metabolic Panel: Recent Labs  Lab 10/07/18 1343 10/07/18 1543 10/08/18 0421 10/09/18 0527  NA 139 141 141 141  K 4.7 4.3 4.0 3.4*  CL 105 107 112* 110  CO2 24 23 21* 20*  GLUCOSE 109* 106* 101* 121*  BUN 31* 33* 38* 39*  CREATININE 0.62 0.88 0.46 0.79  CALCIUM 9.0 9.2 7.9* 7.7*   GFR: Estimated Creatinine Clearance: 45.1 mL/min (by C-G formula based on SCr of 0.79  mg/dL). Liver Function Tests: Recent Labs  Lab 10/07/18 1543 10/09/18 0527  AST 17 13*  ALT 15 12  ALKPHOS 74 40  BILITOT 0.4 0.7  PROT 7.5 4.8*  ALBUMIN 4.0 2.5*   No results for input(s): LIPASE, AMYLASE in the last 168 hours. No results for input(s): AMMONIA in the last 168 hours. Coagulation Profile: Recent Labs  Lab 10/07/18 1543 10/08/18 0421 10/08/18 1447 10/09/18 0527  INR 1.7* 2.2* 1.7* 1.3*   Cardiac Enzymes: No results for input(s): CKTOTAL, CKMB, CKMBINDEX, TROPONINI in the last 168 hours. BNP (last 3 results) No results for input(s): PROBNP in the last 8760 hours. HbA1C: No results for input(s): HGBA1C in the last 72 hours. CBG: No results for input(s): GLUCAP in the last 168 hours. Lipid Profile: No results for input(s): CHOL, HDL, LDLCALC, TRIG, CHOLHDL, LDLDIRECT in the last 72 hours. Thyroid Function Tests: No results for input(s): TSH, T4TOTAL, FREET4, T3FREE, THYROIDAB in the last 72 hours. Anemia Panel: No results for input(s): VITAMINB12, FOLATE, FERRITIN, TIBC, IRON, RETICCTPCT in the last 72 hours. Sepsis Labs: No results for input(s): PROCALCITON, LATICACIDVEN in the last 168 hours.  Recent Results (from the past 240 hour(s))  SARS Coronavirus 2 (CEPHEID - Performed in [place] hospital lab), Hosp Order     Status: None   Collection Time: 10/07/18  6:00 PM   Specimen: Nasopharyngeal Swab  Result Value Ref Range Status   SARS Coronavirus 2 NEGATIVE NEGATIVE Final    Comment: (NOTE) If result is NEGATIVE SARS-CoV-2 target nucleic acids are NOT DETECTED. The SARS-CoV-2 RNA is generally detectable in upper and lower  respiratory specimens during the acute phase of infection. The lowest  concentration of SARS-CoV-2 viral copies this assay can detect is 250  copies / mL. A negative result does not preclude SARS-CoV-2 infection  and should not be used as the sole basis for treatment or other  patient management decisions.  A negative result  may occur with  improper specimen collection / handling, submission of specimen other  than nasopharyngeal swab, presence of viral mutation(s) within the  areas targeted by this assay, and inadequate number of viral copies  (<250 copies / mL). A negative result must be combined with clinical  observations, patient history, and epidemiological information. If result is POSITIVE SARS-CoV-2 target nucleic acids are DETECTED. The SARS-CoV-2 RNA is generally detectable in upper and lower  respiratory specimens dur ing the acute phase of infection.  Positive  results are indicative of active infection with SARS-CoV-2.  Clinical  correlation with patient history and other diagnostic information is  necessary to determine patient infection status.  Positive results do  not rule out bacterial infection or co-infection with other viruses. If result is PRESUMPTIVE POSTIVE SARS-CoV-2 nucleic acids MAY BE PRESENT.   A presumptive positive result was obtained on the submitted specimen  and confirmed on repeat testing.  While 2019 novel coronavirus  (  SARS-CoV-2) nucleic acids may be present in the submitted sample  additional confirmatory testing may be necessary for epidemiological  and / or clinical management purposes  to differentiate between  SARS-CoV-2 and other Sarbecovirus currently known to infect humans.  If clinically indicated additional testing with an alternate test  methodology [phone]) is advised. The SARS-CoV-2 RNA is generally  detectable in upper and lower respiratory sp ecimens during the acute  phase of infection. The expected result is Negative. Fact Sheet for Patients:  StrictlyIdeas.no Fact Sheet for Healthcare Providers: BankingDealers.co.za This test is not yet approved or cleared by the [place] FDA and has been authorized for detection and/or diagnosis of SARS-CoV-2 by FDA under an Emergency Use Authorization (EUA).   This EUA will remain in effect (meaning this test can be used) for the duration of the COVID-19 declaration under Section 564(b)(1) of the Act, 21 U.S.C. section 360bbb-3(b)(1), unless the authorization is terminated or revoked sooner. Performed at [facility], [address] [place], [place] [address]   MRSA PCR Screening     Status: None   Collection Time: 10/08/18  6:37 AM   Specimen: Nasal Mucosa; Nasopharyngeal  Result Value Ref Range Status   MRSA by PCR NEGATIVE NEGATIVE Final    Comment:        The GeneXpert MRSA Assay (FDA approved for NASAL specimens only), is one component of a comprehensive MRSA colonization surveillance program. It is not intended to diagnose MRSA infection nor to guide or monitor treatment for MRSA infections. Performed at [facility], [address] [place], [place] [address]       Radiology Studies: Ir Angiogram Visceral Selective  Result Date: 10/08/2018 INDICATION: 78 year old female with acutely bleeding refractory to endoscopic therapy. She presents for angiogram and embolization. EXAM: IR ULTRASOUND GUIDANCE VASC ACCESS RIGHT; IR EMBO ART VEN HEMORR LYMPH EXTRAV INC GUIDE ROADMAPPING; ADDITIONAL ARTERIOGRAPHY; SELECTIVE VISCERAL ARTERIOGRAPHY 1. Ultrasound-guided access right common femoral artery 2. Catheterization of the celiac artery with arteriogram 3. Catheterization gastroduodenal artery with arteriogram 4. Coil embolization of the gastroduodenal artery MEDICATIONS: 1 units packed red blood cells administered in interventional radiology ANESTHESIA/SEDATION: Moderate (conscious) sedation was employed during this procedure. A total of Versed 0.5 mg and Fentanyl 25 mcg was administered intravenously. Moderate Sedation Time: 41 minutes. The patient's level of consciousness and vital signs were monitored continuously by radiology nursing throughout the procedure under my direct supervision. CONTRAST:  97mL OMNIPAQUE IOHEXOL 300 MG/ML  SOLN  FLUOROSCOPY TIME:  Fluoroscopy Time: 12 minutes 36 seconds (61 mGy). COMPLICATIONS: None immediate. PROCEDURE: Informed consent was obtained from the patient following explanation of the procedure, risks, benefits and alternatives. The patient understands, agrees and consents for the procedure. All questions were addressed. A time out was performed prior to the initiation of the procedure. Maximal barrier sterile technique utilized including caps, mask, sterile gowns, sterile gloves, large sterile drape, hand hygiene, and Betadine prep. The right common femoral artery was interrogated with ultrasound and found to be widely patent. An image was obtained and stored for the medical record. Local anesthesia was attained by infiltration with 1% lidocaine. A small dermatotomy was made. Under real-time sonographic guidance, the vessel was punctured with a 21 gauge micropuncture needle. Using standard technique, the initial micro needle was exchanged over a 0.018 micro wire for a transitional 4 [place] micro sheath. The micro sheath was then exchanged over a 0.035 wire for a 5 French vascular sheath. A C2 cobra catheter was advanced over a Bentson wire into  the abdominal aorta. C2 cobra catheter could just engage with the origin of the celiac artery. Celiac arteriography was performed. Anatomy appears conventional. No evidence of active bleeding. The C2 cobra catheter could not seat well in the artery origin secondary to steep angulation of the proximal artery. Therefore, the C2 cobra catheter was exchanged for a Sos Omni selective catheter. The SOS and Glidewire were then used to secure access to the celiac axis. A landed microcatheter was then navigated over a Fathom 16 wire in used to select the distal gastroduodenal artery. Arteriography was performed. No evidence of active extravasation at this time. Given the patient's significant recent bleed and current anticoagulated status, the decision was made to proceed with  prophylactic embolization of the gastroduodenal artery. Coil embolization was then performed using a combination of 3 and 4 mm Ruby detachable microcoils. Complete occlusion of the gastroduodenal artery was achieved. The catheters were removed. A limited right common femoral arteriogram demonstrates a high bifurcation. The access is good and within the short segment of common femoral artery and below the inguinal ligament. Hemostasis was attained with the assistance of an Angio-Seal closure device. IMPRESSION: 1. No active arterial extravasation at this time. 2. Prophylactic coil embolization of the gastroduodenal artery. Signed, Criselda Peaches, MD, RPVI [facility] Radiology PLAN: Continue to monitor H and H and transfuse as needed. Electronically Signed   By: Jacqulynn Cadet M.D.   On: 10/08/2018 19:53   Ir Angiogram Selective Each Additional Vessel  Result Date: 10/08/2018 INDICATION: 78 year old female with acutely bleeding refractory to endoscopic therapy. She presents for angiogram and embolization. EXAM: IR ULTRASOUND GUIDANCE VASC ACCESS RIGHT; IR EMBO ART VEN HEMORR LYMPH EXTRAV INC GUIDE ROADMAPPING; ADDITIONAL ARTERIOGRAPHY; SELECTIVE VISCERAL ARTERIOGRAPHY 1. Ultrasound-guided access right common femoral artery 2. Catheterization of the celiac artery with arteriogram 3. Catheterization gastroduodenal artery with arteriogram 4. Coil embolization of the gastroduodenal artery MEDICATIONS: 1 units packed red blood cells administered in interventional radiology ANESTHESIA/SEDATION: Moderate (conscious) sedation was employed during this procedure. A total of Versed 0.5 mg and Fentanyl 25 mcg was administered intravenously. Moderate Sedation Time: 41 minutes. The patient's level of consciousness and vital signs were monitored continuously by radiology nursing throughout the procedure under my direct supervision. CONTRAST:  67mL OMNIPAQUE IOHEXOL 300  MG/ML  SOLN FLUOROSCOPY TIME:  Fluoroscopy Time: 12 minutes 36 seconds (61 mGy). COMPLICATIONS: None immediate. PROCEDURE: Informed consent was obtained from the patient following explanation of the procedure, risks, benefits and alternatives. The patient understands, agrees and consents for the procedure. All questions were addressed. A time out was performed prior to the initiation of the procedure. Maximal barrier sterile technique utilized including caps, mask, sterile gowns, sterile gloves, large sterile drape, hand hygiene, and Betadine prep. The right common femoral artery was interrogated with ultrasound and found to be widely patent. An image was obtained and stored for the medical record. Local anesthesia was attained by infiltration with 1% lidocaine. A small dermatotomy was made. Under real-time sonographic guidance, the vessel was punctured with a 21 gauge micropuncture needle. Using standard technique, the initial micro needle was exchanged over a 0.018 micro wire for a transitional 4 [place] micro sheath. The micro sheath was then exchanged over a 0.035 wire for a 5 French vascular sheath. A C2 cobra catheter was advanced over a Bentson wire into the abdominal aorta. C2 cobra catheter could just engage with the origin of the celiac artery. Celiac arteriography was performed. Anatomy appears conventional. No evidence  of active bleeding. The C2 cobra catheter could not seat well in the artery origin secondary to steep angulation of the proximal artery. Therefore, the C2 cobra catheter was exchanged for a Sos Omni selective catheter. The SOS and Glidewire were then used to secure access to the celiac axis. A landed microcatheter was then navigated over a Fathom 16 wire in used to select the distal gastroduodenal artery. Arteriography was performed. No evidence of active extravasation at this time. Given the patient's significant recent bleed and current anticoagulated status, the decision was made to  proceed with prophylactic embolization of the gastroduodenal artery. Coil embolization was then performed using a combination of 3 and 4 mm Ruby detachable microcoils. Complete occlusion of the gastroduodenal artery was achieved. The catheters were removed. A limited right common femoral arteriogram demonstrates a high bifurcation. The access is good and within the short segment of common femoral artery and below the inguinal ligament. Hemostasis was attained with the assistance of an Angio-Seal closure device. IMPRESSION: 1. No active arterial extravasation at this time. 2. Prophylactic coil embolization of the gastroduodenal artery. Signed, Criselda Peaches, MD, RPVI [facility] Radiology PLAN: Continue to monitor H and H and transfuse as needed. Electronically Signed   By: Jacqulynn Cadet M.D.   On: 10/08/2018 19:53   Ir US Guide Vasc Access Right  Result Date: 10/08/2018 INDICATION: 78 year old female with acutely bleeding refractory to endoscopic therapy. She presents for angiogram and embolization. EXAM: IR ULTRASOUND GUIDANCE VASC ACCESS RIGHT; IR EMBO ART VEN HEMORR LYMPH EXTRAV INC GUIDE ROADMAPPING; ADDITIONAL ARTERIOGRAPHY; SELECTIVE VISCERAL ARTERIOGRAPHY 1. Ultrasound-guided access right common femoral artery 2. Catheterization of the celiac artery with arteriogram 3. Catheterization gastroduodenal artery with arteriogram 4. Coil embolization of the gastroduodenal artery MEDICATIONS: 1 units packed red blood cells administered in interventional radiology ANESTHESIA/SEDATION: Moderate (conscious) sedation was employed during this procedure. A total of Versed 0.5 mg and Fentanyl 25 mcg was administered intravenously. Moderate Sedation Time: 41 minutes. The patient's level of consciousness and vital signs were monitored continuously by radiology nursing throughout the procedure under my direct supervision. CONTRAST:  55mL OMNIPAQUE IOHEXOL 300 MG/ML   SOLN FLUOROSCOPY TIME:  Fluoroscopy Time: 12 minutes 36 seconds (61 mGy). COMPLICATIONS: None immediate. PROCEDURE: Informed consent was obtained from the patient following explanation of the procedure, risks, benefits and alternatives. The patient understands, agrees and consents for the procedure. All questions were addressed. A time out was performed prior to the initiation of the procedure. Maximal barrier sterile technique utilized including caps, mask, sterile gowns, sterile gloves, large sterile drape, hand hygiene, and Betadine prep. The right common femoral artery was interrogated with ultrasound and found to be widely patent. An image was obtained and stored for the medical record. Local anesthesia was attained by infiltration with 1% lidocaine. A small dermatotomy was made. Under real-time sonographic guidance, the vessel was punctured with a 21 gauge micropuncture needle. Using standard technique, the initial micro needle was exchanged over a 0.018 micro wire for a transitional 4 [place] micro sheath. The micro sheath was then exchanged over a 0.035 wire for a 5 French vascular sheath. A C2 cobra catheter was advanced over a Bentson wire into the abdominal aorta. C2 cobra catheter could just engage with the origin of the celiac artery. Celiac arteriography was performed. Anatomy appears conventional. No evidence of active bleeding. The C2 cobra catheter could not seat well in the artery origin secondary to steep angulation of the proximal artery. Therefore, the  C2 cobra catheter was exchanged for a Sos Omni selective catheter. The SOS and Glidewire were then used to secure access to the celiac axis. A landed microcatheter was then navigated over a Fathom 16 wire in used to select the distal gastroduodenal artery. Arteriography was performed. No evidence of active extravasation at this time. Given the patient's significant recent bleed and current anticoagulated status, the decision was made to proceed  with prophylactic embolization of the gastroduodenal artery. Coil embolization was then performed using a combination of 3 and 4 mm Ruby detachable microcoils. Complete occlusion of the gastroduodenal artery was achieved. The catheters were removed. A limited right common femoral arteriogram demonstrates a high bifurcation. The access is good and within the short segment of common femoral artery and below the inguinal ligament. Hemostasis was attained with the assistance of an Angio-Seal closure device. IMPRESSION: 1. No active arterial extravasation at this time. 2. Prophylactic coil embolization of the gastroduodenal artery. Signed, Criselda Peaches, MD, RPVI [facility] Radiology PLAN: Continue to monitor H and H and transfuse as needed. Electronically Signed   By: Jacqulynn Cadet M.D.   On: 10/08/2018 19:53   [place] [facility] Roadmapping  Result Date: 10/08/2018 INDICATION: 78 year old female with acutely bleeding refractory to endoscopic therapy. She presents for angiogram and embolization. EXAM: IR ULTRASOUND GUIDANCE VASC ACCESS RIGHT; IR EMBO ART VEN HEMORR LYMPH EXTRAV INC GUIDE ROADMAPPING; ADDITIONAL ARTERIOGRAPHY; SELECTIVE VISCERAL ARTERIOGRAPHY 1. Ultrasound-guided access right common femoral artery 2. Catheterization of the celiac artery with arteriogram 3. Catheterization gastroduodenal artery with arteriogram 4. Coil embolization of the gastroduodenal artery MEDICATIONS: 1 units packed red blood cells administered in interventional radiology ANESTHESIA/SEDATION: Moderate (conscious) sedation was employed during this procedure. A total of Versed 0.5 mg and Fentanyl 25 mcg was administered intravenously. Moderate Sedation Time: 41 minutes. The patient's level of consciousness and vital signs were monitored continuously by radiology nursing throughout the procedure under my direct supervision. CONTRAST:  39mL  OMNIPAQUE IOHEXOL 300 MG/ML  SOLN FLUOROSCOPY TIME:  Fluoroscopy Time: 12 minutes 36 seconds (61 mGy). COMPLICATIONS: None immediate. PROCEDURE: Informed consent was obtained from the patient following explanation of the procedure, risks, benefits and alternatives. The patient understands, agrees and consents for the procedure. All questions were addressed. A time out was performed prior to the initiation of the procedure. Maximal barrier sterile technique utilized including caps, mask, sterile gowns, sterile gloves, large sterile drape, hand hygiene, and Betadine prep. The right common femoral artery was interrogated with ultrasound and found to be widely patent. An image was obtained and stored for the medical record. Local anesthesia was attained by infiltration with 1% lidocaine. A small dermatotomy was made. Under real-time sonographic guidance, the vessel was punctured with a 21 gauge micropuncture needle. Using standard technique, the initial micro needle was exchanged over a 0.018 micro wire for a transitional 4 [place] micro sheath. The micro sheath was then exchanged over a 0.035 wire for a 5 French vascular sheath. A C2 cobra catheter was advanced over a Bentson wire into the abdominal aorta. C2 cobra catheter could just engage with the origin of the celiac artery. Celiac arteriography was performed. Anatomy appears conventional. No evidence of active bleeding. The C2 cobra catheter could not seat well in the artery origin secondary to steep angulation of the proximal artery. Therefore, the C2 cobra catheter was exchanged for a Sos Omni selective catheter. The SOS and Glidewire were then used to  secure access to the celiac axis. A landed microcatheter was then navigated over a Fathom 16 wire in used to select the distal gastroduodenal artery. Arteriography was performed. No evidence of active extravasation at this time. Given the patient's significant recent bleed and current anticoagulated status, the  decision was made to proceed with prophylactic embolization of the gastroduodenal artery. Coil embolization was then performed using a combination of 3 and 4 mm Ruby detachable microcoils. Complete occlusion of the gastroduodenal artery was achieved. The catheters were removed. A limited right common femoral arteriogram demonstrates a high bifurcation. The access is good and within the short segment of common femoral artery and below the inguinal ligament. Hemostasis was attained with the assistance of an Angio-Seal closure device. IMPRESSION: 1. No active arterial extravasation at this time. 2. Prophylactic coil embolization of the gastroduodenal artery. Signed, Criselda Peaches, MD, RPVI [facility] Radiology PLAN: Continue to monitor H and H and transfuse as needed. Electronically Signed   By: Jacqulynn Cadet M.D.   On: 10/08/2018 19:53      LOS: 1 day   Time spent: More than 50% of that time was spent in counseling and/or coordination of care.  Antonieta Pert, MD [facility]  10/09/2018, 8:16 AM

## 2018-10-10 ENCOUNTER — Inpatient Hospital Stay (HOSPITAL_COMMUNITY): Payer: Medicare Other | Admitting: Anesthesiology

## 2018-10-10 ENCOUNTER — Encounter (HOSPITAL_COMMUNITY): Payer: Self-pay

## 2018-10-10 ENCOUNTER — Encounter (HOSPITAL_COMMUNITY)
Admission: EM | Disposition: A | Discharge status: Home / Self Care | Payer: Self-pay | Source: Home / Self Care | Attending: Internal Medicine

## 2018-10-10 DIAGNOSIS — I480 Paroxysmal atrial fibrillation: Secondary | ICD-10-CM

## 2018-10-10 HISTORY — PX: ESOPHAGOGASTRODUODENOSCOPY: SHX5428

## 2018-10-10 HISTORY — PX: HOT HEMOSTASIS: SHX5433

## 2018-10-10 LAB — CBC
HCT: 20.3 % — ABNORMAL LOW (ref 36.0–46.0)
Hemoglobin: 6.7 g/dL — CL (ref 12.0–15.0)
MCH: 29.9 pg (ref 26.0–34.0)
MCHC: 33 g/dL (ref 30.0–36.0)
MCV: 90.6 fL (ref 80.0–100.0)
Platelets: 122 10*3/uL — ABNORMAL LOW (ref 150–400)
RBC: 2.24 MIL/uL — ABNORMAL LOW (ref 3.87–5.11)
RDW: 15.7 % — ABNORMAL HIGH (ref 11.5–15.5)
WBC: 7.7 10*3/uL (ref 4.0–10.5)
nRBC: 0 % (ref 0.0–0.2)

## 2018-10-10 LAB — HEMOGLOBIN AND HEMATOCRIT, BLOOD
HCT: 26.2 % — ABNORMAL LOW (ref 36.0–46.0)
Hemoglobin: 9 g/dL — ABNORMAL LOW (ref 12.0–15.0)

## 2018-10-10 LAB — PROTIME-INR
INR: 1.2 (ref 0.8–1.2)
Prothrombin Time: 15.3 seconds — ABNORMAL HIGH (ref 11.4–15.2)

## 2018-10-10 LAB — PREPARE RBC (CROSSMATCH)

## 2018-10-10 SURGERY — EGD (ESOPHAGOGASTRODUODENOSCOPY)
Anesthesia: Monitor Anesthesia Care

## 2018-10-10 MED ORDER — SODIUM CHLORIDE 0.9 % IV SOLN: INTRAVENOUS | Status: DC

## 2018-10-10 MED ORDER — PROPOFOL 500 MG/50ML IV EMUL
INTRAVENOUS | Status: DC | PRN
  Administered 2018-10-10: 50 ug/kg/min via INTRAVENOUS

## 2018-10-10 MED ORDER — PROPOFOL 10 MG/ML IV BOLUS
INTRAVENOUS | Status: DC | PRN
  Administered 2018-10-10: 20 mg via INTRAVENOUS

## 2018-10-10 MED ORDER — LACTATED RINGERS IV SOLN
INTRAVENOUS | Status: DC | PRN
  Administered 2018-10-10: 10:00:00 via INTRAVENOUS

## 2018-10-10 MED ORDER — SODIUM CHLORIDE 0.9% IV SOLUTION: Freq: Once | INTRAVENOUS | Status: DC

## 2018-10-10 NOTE — H&P (View-Only) (Signed)
Patient and nursing report one very small passage of black stool last evening.  Denies abd pain, chest pain or dyspnea.  BP 105/70, HR 80's Alert, conversational and comfortable.  Hgb dropped down to 6.7 ovn and currently getting 1 unit PRBCs ( to be done about an hour from now)  INR 1.2  plt 122K  I am concerned about the decreasing Hgb, and think we need to know for certain if there is any ongoing bleeding.  Recommended EGD this morning, and she is agreeable.   The benefits and risks of the planned procedure were described in detail with the patient or (when appropriate) their health care proxy.  Risks were outlined as including, but not limited to, bleeding, infection, perforation, adverse medication reaction leading to cardiac or pulmonary decompensation.  The limitation of incomplete mucosal visualization was also discussed.  No guarantees or warranties were given.  Patient called her daughter while I was there, I also spoke with her and gave update.  Will call family after procedure as well.  Nursing updated.

## 2018-10-10 NOTE — Transfer of Care (Signed)
Immediate Anesthesia Transfer of Care Note  Patient: [name]  Procedure(s) Performed: ESOPHAGOGASTRODUODENOSCOPY (EGD) (N/A ) HOT HEMOSTASIS (ARGON PLASMA COAGULATION/BICAP) (N/A )  Patient Location: Endoscopy Unit  Anesthesia Type:MAC  Level of Consciousness: awake, alert  and oriented  Airway & Oxygen Therapy: Patient Spontanous Breathing and Patient connected to nasal cannula oxygen  Post-op Assessment: Report given to RN, Post -op Vital signs reviewed and stable and Patient moving all extremities X 4  Post vital signs: Reviewed  Last Vitals:  Vitals Value Taken Time  BP    Temp    Pulse 87 10/10/18 1039  Resp 19 10/10/18 1039  SpO2 100 % 10/10/18 1039  Vitals shown include unvalidated device data.  Last Pain:  Vitals:   10/10/18 0944  TempSrc: Oral  PainSc: 0-No pain      Patients Stated Pain Goal: 0 (45/80/99 8338)  Complications: No apparent anesthesia complications

## 2018-10-10 NOTE — Plan of Care (Signed)
  Problem: Activity: Goal: Risk for activity intolerance will decrease Outcome: Progressing   Problem: Safety: Goal: Ability to remain free from injury will improve Outcome: Progressing   Problem: Clinical Measurements: Goal: Complications related to the disease process, condition or treatment will be avoided or minimized Outcome: Progressing   Problem: Nutrition: Goal: Adequate nutrition will be maintained Outcome: Completed/Met

## 2018-10-10 NOTE — Evaluation (Signed)
Physical Therapy Evaluation Patient Details Name: Hannah Roy MRN: [id number] DOB: 10-14-1940 Today's Date: 10/10/2018   History of Present Illness  Hannah Roy is a 78 y.o. female with medical history significant for persistent atrial fibrillation, rheumatic valvular disease, and hypertension who was recently hospitalized from 08/31/2018-09/13/2018.  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  She was discharged on Coumadin for anticoagulation.  Admitted due to weakness, anemia, GIB.  S/p multiple units PRBC;s and now 2 EGD's.  Clinical Impression  Patient presents with generalized weakness, decreased activity tolerance and decreased balance contributing to decreased independence with mobility.  She will benefit from skilled PT in the acute setting to allow return home with family support and follow up HHPT.    Follow Up Recommendations Home health PT    Equipment Recommendations  None recommended by PT    Recommendations for Other Services       Precautions / Restrictions Precautions Precautions: Fall      Mobility  Bed Mobility               General bed mobility comments: up in recliner  Transfers Overall transfer level: Needs assistance Equipment used: None Transfers: Sit to/from Stand Sit to Stand: Min guard         General transfer comment: steadying assist  Ambulation/Gait Ambulation/Gait assistance: Min guard;Min assist Gait Distance (Feet): 100 Feet Assistive device: None Gait Pattern/deviations: Step-through pattern;Decreased stride length     General Gait Details: assist for balance, due to weakness  Stairs            Wheelchair Mobility    Modified Rankin (Stroke Patients Only)       Balance Overall balance assessment: Needs assistance   Sitting  balance-Leahy Scale: Good       Standing balance-Leahy Scale: Fair Standing balance comment: washing hands at sink                             Pertinent Vitals/Pain Pain Assessment: No/denies pain    Home Living Family/patient expects to be discharged to:: Private residence Living Arrangements: Spouse/significant other Available Help at Discharge: Family Type of Home: House Home Access: Stairs to enter Entrance Stairs-Rails: Right   Home Layout: Multi-level Home Equipment: Environmental consultant - 2 wheels Additional Comments: stair lift    Prior Function Level of Independence: Needs assistance   Gait / Transfers Assistance Needed: walking unaided, but some ADL assist  ADL's / Homemaking Assistance Needed: daughter assisting with shower, pt slightly more independent with dressing than just after heart procedure        Hand Dominance        Extremity/Trunk Assessment   Upper Extremity Assessment Upper Extremity Assessment: Generalized weakness(still with limitations with shoulder elevation s/p AVR/MVR)    Lower Extremity Assessment Lower Extremity Assessment: Generalized weakness    Cervical / Trunk Assessment Cervical / Trunk Assessment: Normal  Communication   Communication: No difficulties  Cognition Arousal/Alertness: Awake/alert Behavior During Therapy: WFL for tasks assessed/performed Overall Cognitive Status: Within Functional Limits for tasks assessed                                        General Comments General comments (skin integrity,  edema, etc.): noted some dark colored stool while toileting, possibly still old blod    Exercises     Assessment/Plan    PT Assessment Patient needs continued PT services  PT Problem List Decreased range of motion;Decreased activity tolerance;Decreased mobility;Decreased knowledge of use of DME;Decreased knowledge of precautions       PT Treatment Interventions DME instruction;Stair  training;Therapeutic activities;Gait training;Functional mobility training;Therapeutic exercise;Patient/family education    PT Goals (Current goals can be found in the Care Plan section)  Acute Rehab PT Goals Patient Stated Goal: to return home PT Goal Formulation: With patient Time For Goal Achievement: 10/17/18 Potential to Achieve Goals: Good    Frequency Min 3X/week   Barriers to discharge        Co-evaluation               AM-PAC PT "6 Clicks" Mobility  Outcome Measure Help needed turning from your back to your side while in a flat bed without using bedrails?: None Help needed moving from lying on your back to sitting on the side of a flat bed without using bedrails?: None Help needed moving to and from a bed to a chair (including a wheelchair)?: A Little Help needed standing up from a chair using your arms (e.g., wheelchair or bedside chair)?: A Little Help needed to walk in hospital room?: A Little Help needed climbing 3-5 steps with a railing? : A Little 6 Click Score: 20    End of Session Equipment Utilized During Treatment: Gait belt Activity Tolerance: Patient tolerated treatment well Patient left: with call bell/phone within reach;in chair;with chair alarm set;Other (comment)(IV disconnected)   PT Visit Diagnosis: Other abnormalities of gait and mobility (R26.89)    Time: 5009-3818 PT Time Calculation (min) (ACUTE ONLY): 38 min   Charges:   PT Evaluation $PT Eval Moderate Complexity: 1 Mod PT Treatments $Gait Training: 8-22 mins        Magda Kiel, [place] [facility] [phone] 10/10/2018  Reginia Naas 10/10/2018, 3:48 PM

## 2018-10-10 NOTE — Addendum Note (Signed)
Addendum  created 10/10/18 0758 by Ollen Bowl, CRNA   Charge Capture section accepted

## 2018-10-10 NOTE — Interval H&P Note (Signed)
History and Physical Interval Note:  10/10/2018 9:54 AM  [place]  has presented today for surgery, with the diagnosis of acute blood loss anemia, duodenal ulcer.  The various methods of treatment have been discussed with the patient and family. After consideration of risks, benefits and other options for treatment, the patient has consented to  Procedure(s): ESOPHAGOGASTRODUODENOSCOPY (EGD) (N/A) as a surgical intervention.  The patient's history has been reviewed, patient examined, no change in status, stable for surgery.  I have reviewed the patient's chart and labs.  Questions were answered to the patient's satisfaction.     Nelida Meuse III

## 2018-10-10 NOTE — Op Note (Signed)
[facility] Patient Name: Hannah Roy Procedure Date : 10/10/2018 MRN: [id number] Attending MD: Estill Cotta. Loletha Carrow , MD Date of Birth: 10-26-1940 CSN: [id number] Age: 78 Admit Type: Inpatient Procedure:                Upper GI endoscopy Indications:              Acute post hemorrhagic anemia, Melena, Follow-up of                            duodenal ulcer with hemorrhage Providers:                Mallie Mussel L. Loletha Carrow, MD, Burtis Junes, RN, William Dalton,                            Technician Referring MD:             [facility] Medicines:                Monitored Anesthesia Care Complications:            No immediate complications. Estimated Blood Loss:     Estimated blood loss was minimal. Procedure:                Pre-Anesthesia Assessment:                           - Prior to the procedure, a History and Physical                            was performed, and patient medications and                            allergies were reviewed. The patient's tolerance of                            previous anesthesia was also reviewed. The risks                            and benefits of the procedure and the sedation                            options and risks were discussed with the patient.                            All questions were answered, and informed consent                            was obtained. Prior Anticoagulants: The patient has                            taken Coumadin (warfarin), last dose was 3 days                            prior to procedure. ASA Grade Assessment: III - A  patient with severe systemic disease. After                            reviewing the risks and benefits, the patient was                            deemed in satisfactory condition to undergo the                            procedure.                           After obtaining informed consent, the endoscope was                            passed under direct vision.  Throughout the                            procedure, the patient's blood pressure, pulse, and                            oxygen saturations were monitored continuously. The                            GIF-H190 ([id number] Olympus gastroscope was                            introduced through the mouth, and advanced to the                            second part of duodenum. The upper GI endoscopy was                            accomplished without difficulty. The patient                            tolerated the procedure well. Scope In: Scope Out: Findings:      The esophagus was normal.      A few diminutive erosions were found in the prepyloric region of the       stomach.      The exam of the stomach was otherwise normal.      One oozing superficial duodenal ulcer was found in the duodenal bulb.       The lesion was 10 mm in largest dimension, tissue friable. Coagulation       for hemostasis using argon plasma (right colon setting) was successful.      The exam of the duodenum was otherwise normal. Impression:               - Normal esophagus.                           - Erosive gastropathy.                           - Oozing duodenal ulcer. Treated with argon plasma  coagulation (APC).                           - No specimens collected. Moderate Sedation:      MAC sedation used Recommendation:           - Return patient to hospital ward for ongoing care.                           - Continue present medications. Protonix drip until                            tomorrow AM, then change to 40 mg PO twice daily.                           - Resume regular diet.                           - Follow up results of serum H. pylori antibody.                           - If coumadin is felt to be necessary due to recent                            cardiac surgery, patient will need repeaat                            outpatient repeat EGD by [facility] in 2-4                             weeks to assess healing. 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 All rights reserved. The codes documented in this report are preliminary and upon coder review may  be  revised to meet current compliance requirements. Shakur Lembo L. Loletha Carrow, MD 10/10/2018 10:45:15 AM This report has been signed electronically. Number of Addenda: 0

## 2018-10-10 NOTE — Progress Notes (Signed)
Referring Physician(s): Artis Flock  Supervising Physician: Corrie Mckusick  Patient Status:  Hannah Roy  Chief Complaint:  Bloody stools  Subjective: Pt doing ok; reports no further bleeding today; s/p endo with APC oozing duodenal ulcer this am; denies N/V   Allergies: Bee venom, Boniva [ibandronic acid], Lisinopril, and Latex  Medications: Prior to Admission medications   Medication Sig Start Date End Date Taking? Authorizing Provider  aspirin EC 81 MG EC tablet Take 1 tablet (81 mg total) by mouth daily. 09/14/18  Yes Roddenberry, Myron G, PA-C  bisacodyl (DULCOLAX) 5 MG EC tablet Take 5 mg by mouth daily.   Yes [provider]  ferrous [id number] C-folic acid (TRINSICON / FOLTRIN) capsule Take 1 capsule by mouth daily with breakfast. 09/14/18  Yes Roddenberry, Myron G, PA-C  ondansetron (ZOFRAN) 4 MG tablet Take 1 tablet (4 mg total) by mouth every 8 (eight) hours as needed for nausea or vomiting. 09/23/18 09/23/19 Yes Gold, Wayne E, PA-C  traMADol (ULTRAM) 50 MG tablet Take 1 tablet (50 mg total) by mouth every 4 (four) hours as needed for moderate pain. 09/19/18  Yes Rexene Alberts, MD  warfarin (COUMADIN) 2.5 MG tablet Increase coumadin to 1 1/2 tablets daily Patient taking differently: Take 3.75 mg by mouth every evening.  10/01/18  Yes Herminio Commons, MD     Vital Signs: BP 139/71    Pulse 88    Temp 99.3 F (37.4 C) (Oral)    Resp 17    Wt 116 lb 11.2 oz (52.9 kg)    SpO2 100%    BMI 22.05 kg/m   Physical Exam awake/alert; abd soft,NT; rt CFA access site with sl ecchymosis, mild tenderness, area soft, no active bleeding; intact distal pulses  Imaging: Ir Angiogram Visceral Selective  Result Date: 10/08/2018 INDICATION: 78 year old female with acutely bleeding refractory to endoscopic therapy. She presents for angiogram and embolization. EXAM: IR ULTRASOUND GUIDANCE VASC ACCESS RIGHT; IR EMBO ART VEN HEMORR LYMPH EXTRAV INC GUIDE ROADMAPPING;  ADDITIONAL ARTERIOGRAPHY; SELECTIVE VISCERAL ARTERIOGRAPHY 1. Ultrasound-guided access right common femoral artery 2. Catheterization of the celiac artery with arteriogram 3. Catheterization gastroduodenal artery with arteriogram 4. Coil embolization of the gastroduodenal artery MEDICATIONS: 1 units packed red blood cells administered in interventional radiology ANESTHESIA/SEDATION: Moderate (conscious) sedation was employed during this procedure. A total of Versed 0.5 mg and Fentanyl 25 mcg was administered intravenously. Moderate Sedation Time: 41 minutes. The patient's level of consciousness and vital signs were monitored continuously by radiology nursing throughout the procedure under my direct supervision. CONTRAST:  86mL OMNIPAQUE IOHEXOL 300 MG/ML  SOLN FLUOROSCOPY TIME:  Fluoroscopy Time: 12 minutes 36 seconds (61 mGy). COMPLICATIONS: None immediate. PROCEDURE: Informed consent was obtained from the patient following explanation of the procedure, risks, benefits and alternatives. The patient understands, agrees and consents for the procedure. All questions were addressed. A time out was performed prior to the initiation of the procedure. Maximal barrier sterile technique utilized including caps, mask, sterile gowns, sterile gloves, large sterile drape, hand hygiene, and Betadine prep. The right common femoral artery was interrogated with ultrasound and found to be widely patent. An image was obtained and stored for the medical record. Local anesthesia was attained by infiltration with 1% lidocaine. A small dermatotomy was made. Under real-time sonographic guidance, the vessel was punctured with a 21 gauge micropuncture needle. Using standard technique, the initial micro needle was exchanged over a 0.018 micro wire for a transitional 4 [place] micro sheath. The micro sheath  was then exchanged over a 0.035 wire for a 5 [place] vascular sheath. A C2 cobra catheter was advanced over a Bentson wire into the  abdominal aorta. C2 cobra catheter could just engage with the origin of the celiac artery. Celiac arteriography was performed. Anatomy appears conventional. No evidence of active bleeding. The C2 cobra catheter could not seat well in the artery origin secondary to steep angulation of the proximal artery. Therefore, the C2 cobra catheter was exchanged for a Sos Omni selective catheter. The SOS and Glidewire were then used to secure access to the celiac axis. A landed microcatheter was then navigated over a Fathom 16 wire in used to select the distal gastroduodenal artery. Arteriography was performed. No evidence of active extravasation at this time. Given the patient's significant recent bleed and current anticoagulated status, the decision was made to proceed with prophylactic embolization of the gastroduodenal artery. Coil embolization was then performed using a combination of 3 and 4 mm Ruby detachable microcoils. Complete occlusion of the gastroduodenal artery was achieved. The catheters were removed. A limited right common femoral arteriogram demonstrates a high bifurcation. The access is good and within the short segment of common femoral artery and below the inguinal ligament. Hemostasis was attained with the assistance of an Angio-Seal closure device. IMPRESSION: 1. No active arterial extravasation at this time. 2. Prophylactic coil embolization of the gastroduodenal artery. Signed, Criselda Peaches, MD, RPVI [facility] Radiology PLAN: Continue to monitor H and H and transfuse as needed. Electronically Signed   By: Jacqulynn Cadet M.D.   On: 10/08/2018 19:53   Ir Angiogram Selective Each Additional Vessel  Result Date: 10/08/2018 INDICATION: 78 year old female with acutely bleeding refractory to endoscopic therapy. She presents for angiogram and embolization. EXAM: IR ULTRASOUND GUIDANCE VASC ACCESS RIGHT; IR EMBO ART VEN HEMORR LYMPH EXTRAV INC GUIDE  ROADMAPPING; ADDITIONAL ARTERIOGRAPHY; SELECTIVE VISCERAL ARTERIOGRAPHY 1. Ultrasound-guided access right common femoral artery 2. Catheterization of the celiac artery with arteriogram 3. Catheterization gastroduodenal artery with arteriogram 4. Coil embolization of the gastroduodenal artery MEDICATIONS: 1 units packed red blood cells administered in interventional radiology ANESTHESIA/SEDATION: Moderate (conscious) sedation was employed during this procedure. A total of Versed 0.5 mg and Fentanyl 25 mcg was administered intravenously. Moderate Sedation Time: 41 minutes. The patient's level of consciousness and vital signs were monitored continuously by radiology nursing throughout the procedure under my direct supervision. CONTRAST:  14mL OMNIPAQUE IOHEXOL 300 MG/ML  SOLN FLUOROSCOPY TIME:  Fluoroscopy Time: 12 minutes 36 seconds (61 mGy). COMPLICATIONS: None immediate. PROCEDURE: Informed consent was obtained from the patient following explanation of the procedure, risks, benefits and alternatives. The patient understands, agrees and consents for the procedure. All questions were addressed. A time out was performed prior to the initiation of the procedure. Maximal barrier sterile technique utilized including caps, mask, sterile gowns, sterile gloves, large sterile drape, hand hygiene, and Betadine prep. The right common femoral artery was interrogated with ultrasound and found to be widely patent. An image was obtained and stored for the medical record. Local anesthesia was attained by infiltration with 1% lidocaine. A small dermatotomy was made. Under real-time sonographic guidance, the vessel was punctured with a 21 gauge micropuncture needle. Using standard technique, the initial micro needle was exchanged over a 0.018 micro wire for a transitional 4 [place] micro sheath. The micro sheath was then exchanged over a 0.035 wire for a 5 French vascular sheath. A C2 cobra catheter was advanced over a Bentson wire  into  the abdominal aorta. C2 cobra catheter could just engage with the origin of the celiac artery. Celiac arteriography was performed. Anatomy appears conventional. No evidence of active bleeding. The C2 cobra catheter could not seat well in the artery origin secondary to steep angulation of the proximal artery. Therefore, the C2 cobra catheter was exchanged for a Sos Omni selective catheter. The SOS and Glidewire were then used to secure access to the celiac axis. A landed microcatheter was then navigated over a Fathom 16 wire in used to select the distal gastroduodenal artery. Arteriography was performed. No evidence of active extravasation at this time. Given the patient's significant recent bleed and current anticoagulated status, the decision was made to proceed with prophylactic embolization of the gastroduodenal artery. Coil embolization was then performed using a combination of 3 and 4 mm Ruby detachable microcoils. Complete occlusion of the gastroduodenal artery was achieved. The catheters were removed. A limited right common femoral arteriogram demonstrates a high bifurcation. The access is good and within the short segment of common femoral artery and below the inguinal ligament. Hemostasis was attained with the assistance of an Angio-Seal closure device. IMPRESSION: 1. No active arterial extravasation at this time. 2. Prophylactic coil embolization of the gastroduodenal artery. Signed, Criselda Peaches, MD, RPVI [facility] Radiology PLAN: Continue to monitor H and H and transfuse as needed. Electronically Signed   By: Jacqulynn Cadet M.D.   On: 10/08/2018 19:53   Ir US Guide Vasc Access Right  Result Date: 10/08/2018 INDICATION: 78 year old female with acutely bleeding refractory to endoscopic therapy. She presents for angiogram and embolization. EXAM: IR ULTRASOUND GUIDANCE VASC ACCESS RIGHT; IR EMBO ART VEN HEMORR LYMPH EXTRAV INC GUIDE  ROADMAPPING; ADDITIONAL ARTERIOGRAPHY; SELECTIVE VISCERAL ARTERIOGRAPHY 1. Ultrasound-guided access right common femoral artery 2. Catheterization of the celiac artery with arteriogram 3. Catheterization gastroduodenal artery with arteriogram 4. Coil embolization of the gastroduodenal artery MEDICATIONS: 1 units packed red blood cells administered in interventional radiology ANESTHESIA/SEDATION: Moderate (conscious) sedation was employed during this procedure. A total of Versed 0.5 mg and Fentanyl 25 mcg was administered intravenously. Moderate Sedation Time: 41 minutes. The patient's level of consciousness and vital signs were monitored continuously by radiology nursing throughout the procedure under my direct supervision. CONTRAST:  50mL OMNIPAQUE IOHEXOL 300 MG/ML  SOLN FLUOROSCOPY TIME:  Fluoroscopy Time: 12 minutes 36 seconds (61 mGy). COMPLICATIONS: None immediate. PROCEDURE: Informed consent was obtained from the patient following explanation of the procedure, risks, benefits and alternatives. The patient understands, agrees and consents for the procedure. All questions were addressed. A time out was performed prior to the initiation of the procedure. Maximal barrier sterile technique utilized including caps, mask, sterile gowns, sterile gloves, large sterile drape, hand hygiene, and Betadine prep. The right common femoral artery was interrogated with ultrasound and found to be widely patent. An image was obtained and stored for the medical record. Local anesthesia was attained by infiltration with 1% lidocaine. A small dermatotomy was made. Under real-time sonographic guidance, the vessel was punctured with a 21 gauge micropuncture needle. Using standard technique, the initial micro needle was exchanged over a 0.018 micro wire for a transitional 4 [place] micro sheath. The micro sheath was then exchanged over a 0.035 wire for a 5 French vascular sheath. A C2 cobra catheter was advanced over a Bentson wire  into the abdominal aorta. C2 cobra catheter could just engage with the origin of the celiac artery. Celiac arteriography was performed. Anatomy appears conventional. No evidence  of active bleeding. The C2 cobra catheter could not seat well in the artery origin secondary to steep angulation of the proximal artery. Therefore, the C2 cobra catheter was exchanged for a Sos Omni selective catheter. The SOS and Glidewire were then used to secure access to the celiac axis. A landed microcatheter was then navigated over a Fathom 16 wire in used to select the distal gastroduodenal artery. Arteriography was performed. No evidence of active extravasation at this time. Given the patient's significant recent bleed and current anticoagulated status, the decision was made to proceed with prophylactic embolization of the gastroduodenal artery. Coil embolization was then performed using a combination of 3 and 4 mm Ruby detachable microcoils. Complete occlusion of the gastroduodenal artery was achieved. The catheters were removed. A limited right common femoral arteriogram demonstrates a high bifurcation. The access is good and within the short segment of common femoral artery and below the inguinal ligament. Hemostasis was attained with the assistance of an Angio-Seal closure device. IMPRESSION: 1. No active arterial extravasation at this time. 2. Prophylactic coil embolization of the gastroduodenal artery. Signed, Criselda Peaches, MD, RPVI [facility] Radiology PLAN: Continue to monitor H and H and transfuse as needed. Electronically Signed   By: Jacqulynn Cadet M.D.   On: 10/08/2018 19:53   [place] Guide Roadmapping  Result Date: 10/08/2018 INDICATION: 78 year old female with acutely bleeding refractory to endoscopic therapy. She presents for angiogram and embolization. EXAM: IR ULTRASOUND GUIDANCE VASC ACCESS RIGHT; IR EMBO ART VEN HEMORR  LYMPH EXTRAV INC GUIDE ROADMAPPING; ADDITIONAL ARTERIOGRAPHY; SELECTIVE VISCERAL ARTERIOGRAPHY 1. Ultrasound-guided access right common femoral artery 2. Catheterization of the celiac artery with arteriogram 3. Catheterization gastroduodenal artery with arteriogram 4. Coil embolization of the gastroduodenal artery MEDICATIONS: 1 units packed red blood cells administered in interventional radiology ANESTHESIA/SEDATION: Moderate (conscious) sedation was employed during this procedure. A total of Versed 0.5 mg and Fentanyl 25 mcg was administered intravenously. Moderate Sedation Time: 41 minutes. The patient's level of consciousness and vital signs were monitored continuously by radiology nursing throughout the procedure under my direct supervision. CONTRAST:  65mL OMNIPAQUE IOHEXOL 300 MG/ML  SOLN FLUOROSCOPY TIME:  Fluoroscopy Time: 12 minutes 36 seconds (61 mGy). COMPLICATIONS: None immediate. PROCEDURE: Informed consent was obtained from the patient following explanation of the procedure, risks, benefits and alternatives. The patient understands, agrees and consents for the procedure. All questions were addressed. A time out was performed prior to the initiation of the procedure. Maximal barrier sterile technique utilized including caps, mask, sterile gowns, sterile gloves, large sterile drape, hand hygiene, and Betadine prep. The right common femoral artery was interrogated with ultrasound and found to be widely patent. An image was obtained and stored for the medical record. Local anesthesia was attained by infiltration with 1% lidocaine. A small dermatotomy was made. Under real-time sonographic guidance, the vessel was punctured with a 21 gauge micropuncture needle. Using standard technique, the initial micro needle was exchanged over a 0.018 micro wire for a transitional 4 [place] micro sheath. The micro sheath was then exchanged over a 0.035 wire for a 5 French vascular sheath. A C2 cobra catheter was advanced  over a Bentson wire into the abdominal aorta. C2 cobra catheter could just engage with the origin of the celiac artery. Celiac arteriography was performed. Anatomy appears conventional. No evidence of active bleeding. The C2 cobra catheter could not seat well in the artery origin secondary to steep angulation  of the proximal artery. Therefore, the C2 cobra catheter was exchanged for a Sos Omni selective catheter. The SOS and Glidewire were then used to secure access to the celiac axis. A landed microcatheter was then navigated over a Fathom 16 wire in used to select the distal gastroduodenal artery. Arteriography was performed. No evidence of active extravasation at this time. Given the patient's significant recent bleed and current anticoagulated status, the decision was made to proceed with prophylactic embolization of the gastroduodenal artery. Coil embolization was then performed using a combination of 3 and 4 mm Ruby detachable microcoils. Complete occlusion of the gastroduodenal artery was achieved. The catheters were removed. A limited right common femoral arteriogram demonstrates a high bifurcation. The access is good and within the short segment of common femoral artery and below the inguinal ligament. Hemostasis was attained with the assistance of an Angio-Seal closure device. IMPRESSION: 1. No active arterial extravasation at this time. 2. Prophylactic coil embolization of the gastroduodenal artery. Signed, Criselda Peaches, MD, RPVI [facility] Radiology PLAN: Continue to monitor H and H and transfuse as needed. Electronically Signed   By: Jacqulynn Cadet M.D.   On: 10/08/2018 19:53    Labs:  CBC: Recent Labs    10/08/18 1306 10/08/18 1447 10/09/18 0748 10/09/18 1350 10/09/18 1702 10/10/18 0346  WBC 8.4 14.7* 10.0  --   --  7.7  HGB 7.2* 6.7* 8.7* 7.5* 7.4* 6.7*  HCT 22.5* 20.8* 24.9* 22.4* 21.9* 20.3*  PLT 192 173 138*  --   --  122*     COAGS: Recent Labs    09/02/18 0452 09/03/18 0443 09/03/18 1403 09/05/18 1618  10/08/18 0421 10/08/18 1447 10/09/18 0527 10/10/18 0346  INR 1.2  --   --  2.0*   < > 2.2* 1.7* 1.3* 1.2  APTT 93* 110* 70* 49*  --   --   --   --   --    < > = values in this interval not displayed.    BMP: Recent Labs    10/07/18 1343 10/07/18 1543 10/08/18 0421 10/09/18 0527  NA 139 141 141 141  K 4.7 4.3 4.0 3.4*  CL 105 107 112* 110  CO2 24 23 21* 20*  GLUCOSE 109* 106* 101* 121*  BUN 31* 33* 38* 39*  CALCIUM 9.0 9.2 7.9* 7.7*  CREATININE 0.62 0.88 0.46 0.79  GFRNONAA >60 >60 >60 >60  GFRAA >60 >60 >60 >60    LIVER FUNCTION TESTS: Recent Labs    09/02/18 0452 09/05/18 0344 10/07/18 1543 10/09/18 0527  BILITOT 1.0 0.6 0.4 0.7  AST 15 36 17 13*  ALT 20 39 15 12  ALKPHOS 64 57 74 40  PROT 6.3* 6.4* 7.5 4.8*  ALBUMIN 3.5 3.8 4.0 2.5*    Assessment and Plan: Pt with hx actively bleeding duodenal ulcer (on anticoagulation); s/p visceral angio with prophylactic coil embo GDA 6/30; s/p endo with APC oozing duodenal ulcer this am;  temp 99.3, WBC nl; hgb 6.7; on protonix, further plans as per IM/GI   Electronically Signed: D. Rowe Robert, PA-C 10/10/2018, 12:32 PM   I spent a total of 15 minutes at the the patient's bedside AND on the patient's hospital floor or unit, greater than 50% of which was counseling/coordinating care for visceral arteriogram with coil embolization of gastroduodenal artery    Patient ID: Hannah Roy, female   DOB: [birth year], 78 y.o.   MRN: [id number]

## 2018-10-10 NOTE — Progress Notes (Signed)
PT Cancellation Note  Patient Details Name: YENTL VERGE MRN: [id number] DOB: 1940/10/09   Cancelled Treatment:    Reason Eval/Treat Not Completed: Patient at procedure or test/unavailable; patient down for EGD.  Will attempt later as time permits.   Reginia Naas 10/10/2018, 9:57 AM  Magda Kiel, [place] [phone] 10/10/2018

## 2018-10-10 NOTE — Care Management Important Message (Signed)
Important Message  Patient Details  Name: Hannah Roy MRN: [id number] Date of Birth: Aug 27, 1940   Medicare Important Message Given:  Yes     Shelda Altes 10/10/2018, 2:32 PM

## 2018-10-10 NOTE — Anesthesia Preprocedure Evaluation (Addendum)
Anesthesia Evaluation  Patient identified by MRN, date of birth, ID band Patient awake    Reviewed: Allergy & Precautions, NPO status , Patient's Chart, lab work & pertinent test results  Airway Mallampati: I  TM Distance: >3 FB Neck ROM: Full    Dental no notable dental hx. 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 Mid Cx lesion is 40% stenosed. 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   Neuro/Psych negative neurological ROS  negative psych ROS   GI/Hepatic negative GI ROS, Neg liver ROS,   Endo/Other  Hypothyroidism   Renal/GU negative Renal ROS  negative genitourinary   Musculoskeletal negative musculoskeletal ROS (+)   Abdominal   Peds  Hematology  (+) Blood dyscrasia (on coumadin, INR 1.2, Hgb 6.7, plt 122), anemia ,   Anesthesia Other Findings EGD for duodenal ulcer  Reproductive/Obstetrics                            Anesthesia Physical Anesthesia Plan  ASA: IV  Anesthesia Plan: MAC   Post-op Pain Management:    Induction: Intravenous  PONV Risk Score and Plan: 2 and Propofol infusion and Treatment may vary due to age or medical condition  Airway Management Planned: Natural Airway and Simple Face Mask  Additional Equipment:   Intra-op Plan:   Post-operative Plan:   Informed Consent: I have reviewed the patients History and Physical, chart, labs and discussed the  procedure including the risks, benefits and alternatives for the proposed anesthesia with the patient or authorized representative who has indicated his/her understanding and acceptance.     Dental advisory given  Plan Discussed with: CRNA  Anesthesia Plan Comments:        Anesthesia Quick Evaluation

## 2018-10-10 NOTE — Progress Notes (Signed)
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 Roxy Manns , dced from [facility] on 09/13/18, he admitted to [facility] on 10/07/2018 with dizziness, dyspnea and melena transferred back to [facility] on 10/08/2018 due to duodenal ulcer requiring interventional radiology to embolize  6/30 patient underwent successful embolization of GDA 7/1- BM that is black 7/2- hb drifted 6.7- 1 unit prbc transfusion and repeat EGD  Subjective: Patient had no bowel movement today.  Yesterday was very black, denies nausea vomiting abdominal pain.  Getting blood transfusion this morning for hemoglobin 6.7.    Assessment & Plan:  ABLA from bleeding duodenal ulcer: Baseline hemoglobin around 12 G gm in 08/2018. HB improved this am, total 4 unit PRBC transfused. Monitor h/h-getting additional 1 unit PRBC this morning. Recent Labs  Lab 10/08/18 1447 10/09/18 0748 10/09/18 1350 10/09/18 1702 10/10/18 0346  HGB 6.7* 8.7* 7.5* 7.4* 6.7*  HCT 20.8* 24.9* 22.4* 21.9* 20.3*   Actively bleeding duodenal ulcer: Dr Joesph Fillers dcussed with GI at [facility], Howie Ill at [facility]/ IR at [facility]- (Dr.Wagner)/Cardiology Dr. Domenic Polite (at [facility]) and CT Surgery Dr Remus Loffler- transferred to [facility] 6/30 and s/p successful embolization of GDA.  Reports having black stool Hemoglobin further down trended and repeat EGD done today s/p APC  oozing duodenal ulcer Continue on Protonix drip. Diet as per gastroenterology.  Monitor for recurrent bleeding.  Hypotension: Blood pressure had been soft and finally stabilized in low 100-130s today.  Mild hypokalemia:repleted  Rheumatic valvular heart disease status post aortic valve replacement with by bioprosthetic tissue valve 08/28/2026: Per Dr. Roxy Manns --no need for heparin bridge and okay to  hold anticoagulation as patient has bioprosthetic bovine valve (not mechanical valve)  Ascending thoracic aorta aneurysm: recently repaired by Dr.Owen on 09/05/2018, repeat echo 6/30 no significant findings   PAF s/p Maze Procedure on 09/05/2018: maintaining in sinus rhythm. Coumadin on hold due to GI bleed, INR is normal. status post vitamin K 2.51.Currently not on AV nodal blocking agent due to recent episode of type II AV block, and also with hypotension.  Cardiology following closely and appreciate inputs.  Chronic anticoagulation,previously on Eliquis for A. fib, post cardiac surgery she was placed on Coumadin, INR   was 2.2,s/p vitamin K 2.5 mg x 1, INR down to 1.3. AC on hold for now.   DVT prophylaxis: SCD Code Status: FULL Family Communication: POC discussed with the patient.  I had called and updated patient's husband. With her request I called [name] and updated.  Disposition Plan: remains inpatient pending clinical improvement.  Consultants:  Gi (Dr Oneida Alar at [facility]) and Howie Ill at [employer] IR at [facility]- (Dr.Wagner)/Cardiology Dr. Domenic Polite (at [facility]) and CT Surgery Dr C. Roxy Manns at [facility]  Procedures:  10/08/18  EGD: Impression: - UGI BLEED DUE TO ULCER AT JUNCTION D1/D2, NOT AMENABLE TO ENDOSCOPIC THERAPY- H PYLORI STATUS UNKNOWN  6/30:Status post IR embolization of GD.  7/2 EGD Normal esophagus. - Erosive  gastropathy. - Oozing duodenal ulcer. Treated with argon plasma coagulation ([facility]).  ECHO 10/08/18  1. The left ventricle has normal systolic  function with an ejection fraction of 60-65%. The cavity size was normal. There is moderately increased left ventricular wall thickness. Left ventricular diastolic Doppler parameters are consistent with  restrictive filling. No evidence of left ventricular regional wall motion abnormalities. 2. Left atrial size was mildly dilated. 3. A 29 mm [facility] Mitral bovine bioprosthetic valve is present in the mitral position. No significant mitral regurgitation. Valve function grossly normal with normal mean gradient. 4. A 30mm Edwards Magna Ease bovine bioprosthesis is present in the aortic position. No perivalvular leak noted. Sinuses of Valsalva seen with normal aortic root size. LVOT is small. valve function is grossly normal with normal mean gradient. No aortic  regurgitation. 5. The right ventricle has normal systolic function. The cavity was normal. There is no increase in right ventricular wall thickness. Right ventricular systolic pressure is normal with an estimated pressure of 21.7 mmHg. 6. The aortic root is normal in size and structure. 7. 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 Status post tricuspid valve repair. There is mild tricuspid regurgitation. 9. Moderate pericardial effusion. 10. The pericardial effusion is circumferential   Antimicrobials: Anti-infectives (From admission, onward)   None       Objective: Vitals:   10/10/18 0944 10/10/18 1039 10/10/18 1049 10/10/18 1100  BP: 128/79 100/72 110/71 139/71  Pulse:  88 87 88  Resp: 20 (!) 23 20 17   Temp: 98.5 F (36.9 C) 99.3 F (37.4 C)    TempSrc: Oral Oral    SpO2: 100% 100%  100%  Weight:        Intake/Output Summary (Last 24 hours) at 10/10/2018 1250 Last data filed at 10/10/2018 1039 Gross per 24 hour  Intake 4133.48 ml  Output 650 ml  Net 3483.48 ml   Filed Weights   10/09/18 0411 10/10/18 0500  Weight: 50.3 kg 52.9 kg   Weight change: 2.635 kg  Body mass index is 22.05 kg/m.  Intake/Output from previous  day: 07/01 0701 - 07/02 0700 In: 3279.9 [P.O.:480; I.V.:2799.9] Out: 800 [Urine:800] Intake/Output this shift: Total I/O In: 853.6 [I.V.:507.6; Blood:346] Out: 0   Examination: General exam: Calm, comfortable, not in acute distress, older for age, average built.  HEENT:Oral mucosa moist, Ear/Nose WNL grossly, dentition normal. Respiratory system: Recent surgical scar healing on the sternal mid chest bilateral equal air entry, no crackles and wheezing, Cardiovascular system: regular rate and rhythm, S1 & S2 heard, No JVD/murmurs. Gastrointestinal system: Abdomen soft, non-tender, non-distended, BS +. No hepatosplenomegaly palpable. Nervous System:Alert, awake and oriented at baseline. Able to move UE and LE, sensation intact. Extremities: No edema, distal peripheral pulses palpable.  Skin: No rashes,no icterus. MSK: Normal muscle bulk,tone, power  Medications:  Scheduled Meds:  sodium chloride   Intravenous Once   sodium chloride   Intravenous Once   sodium chloride   Intravenous Once   sodium chloride flush  3 mL Intravenous Q12H   Continuous Infusions:  sodium chloride 125 mL/hr at 10/10/18 0424   pantoprozole (PROTONIX) infusion 8 mg/hr (10/10/18 1118)    Data Reviewed: I have personally reviewed following labs and imaging studies  CBC: Recent Labs  Lab 10/08/18 0421 10/08/18 1306 10/08/18 1447 10/09/18 0748 10/09/18 1350 10/09/18 1702 10/10/18 0346  WBC 6.2 8.4 14.7* 10.0  --   --  7.7  HGB 6.6* 7.2* 6.7* 8.7* 7.5* 7.4* 6.7*  HCT 20.8* 22.5* 20.8* 24.9*  22.4* 21.9* 20.3*  MCV 94.5 94.1 92.0 86.5  --   --  90.6  PLT 216 192 173 138*  --   --  517*   Basic Metabolic Panel: Recent Labs  Lab 10/07/18 1343 10/07/18 1543 10/08/18 0421 10/09/18 0527  NA 139 141 141 141  K 4.7 4.3 4.0 3.4*  CL 105 107 112* 110  CO2 24 23 21* 20*  GLUCOSE 109* 106* 101* 121*  BUN 31* 33* 38* 39*  CREATININE 0.62 0.88 0.46 0.79  CALCIUM 9.0 9.2 7.9* 7.7*    GFR: Estimated Creatinine Clearance: 45.1 mL/min (by C-G formula based on SCr of 0.79 mg/dL). Liver Function Tests: Recent Labs  Lab 10/07/18 1543 10/09/18 0527  AST 17 13*  ALT 15 12  ALKPHOS 74 40  BILITOT 0.4 0.7  PROT 7.5 4.8*  ALBUMIN 4.0 2.5*   No results for input(s): LIPASE, AMYLASE in the last 168 hours. No results for input(s): AMMONIA in the last 168 hours. Coagulation Profile: Recent Labs  Lab 10/07/18 1543 10/08/18 0421 10/08/18 1447 10/09/18 0527 10/10/18 0346  INR 1.7* 2.2* 1.7* 1.3* 1.2   Cardiac Enzymes: No results for input(s): CKTOTAL, CKMB, CKMBINDEX, TROPONINI in the last 168 hours. BNP (last 3 results) No results for input(s): PROBNP in the last 8760 hours. HbA1C: No results for input(s): HGBA1C in the last 72 hours. CBG: No results for input(s): GLUCAP in the last 168 hours. Lipid Profile: No results for input(s): CHOL, HDL, LDLCALC, TRIG, CHOLHDL, LDLDIRECT in the last 72 hours. Thyroid Function Tests: No results for input(s): TSH, T4TOTAL, FREET4, T3FREE, THYROIDAB in the last 72 hours. Anemia Panel: No results for input(s): VITAMINB12, FOLATE, FERRITIN, TIBC, IRON, RETICCTPCT in the last 72 hours. Sepsis Labs: No results for input(s): PROCALCITON, LATICACIDVEN in the last 168 hours.  Recent Results (from the past 240 hour(s))  SARS Coronavirus 2 (CEPHEID - Performed in [place] hospital lab), Hosp Order     Status: None   Collection Time: 10/07/18  6:00 PM   Specimen: Nasopharyngeal Swab  Result Value Ref Range Status   SARS Coronavirus 2 NEGATIVE NEGATIVE Final    Comment: (NOTE) If result is NEGATIVE SARS-CoV-2 target nucleic acids are NOT DETECTED. The SARS-CoV-2 RNA is generally detectable in upper and lower  respiratory specimens during the acute phase of infection. The lowest  concentration of SARS-CoV-2 viral copies this assay can detect is 250  copies / mL. A negative result does not preclude SARS-CoV-2 infection  and  should not be used as the sole basis for treatment or other  patient management decisions.  A negative result may occur with  improper specimen collection / handling, submission of specimen other  than nasopharyngeal swab, presence of viral mutation(s) within the  areas targeted by this assay, and inadequate number of viral copies  (<250 copies / mL). A negative result must be combined with clinical  observations, patient history, and epidemiological information. If result is POSITIVE SARS-CoV-2 target nucleic acids are DETECTED. The SARS-CoV-2 RNA is generally detectable in upper and lower  respiratory specimens dur ing the acute phase of infection.  Positive  results are indicative of active infection with SARS-CoV-2.  Clinical  correlation with patient history and other diagnostic information is  necessary to determine patient infection status.  Positive results do  not rule out bacterial infection or co-infection with other viruses. If result is PRESUMPTIVE POSTIVE SARS-CoV-2 nucleic acids MAY BE PRESENT.   A presumptive positive result was obtained on the  submitted specimen  and confirmed on repeat testing.  While 2019 novel coronavirus  (SARS-CoV-2) nucleic acids may be present in the submitted sample  additional confirmatory testing may be necessary for epidemiological  and / or clinical management purposes  to differentiate between  SARS-CoV-2 and other Sarbecovirus currently known to infect humans.  If clinically indicated additional testing with an alternate test  methodology [phone]) is advised. The SARS-CoV-2 RNA is generally  detectable in upper and lower respiratory sp ecimens during the acute  phase of infection. The expected result is Negative. Fact Sheet for Patients:  StrictlyIdeas.no Fact Sheet for Healthcare Providers: BankingDealers.co.za This test is not yet approved or cleared by the [place] FDA and has been  authorized for detection and/or diagnosis of SARS-CoV-2 by FDA under an Emergency Use Authorization (EUA).  This EUA will remain in effect (meaning this test can be used) for the duration of the COVID-19 declaration under Section 564(b)(1) of the Act, 21 U.S.C. section 360bbb-3(b)(1), unless the authorization is terminated or revoked sooner. Performed at [facility], [address] [place], [place] [address]   MRSA PCR Screening     Status: None   Collection Time: 10/08/18  6:37 AM   Specimen: Nasal Mucosa; Nasopharyngeal  Result Value Ref Range Status   MRSA by PCR NEGATIVE NEGATIVE Final    Comment:        The GeneXpert MRSA Assay (FDA approved for NASAL specimens only), is one component of a comprehensive MRSA colonization surveillance program. It is not intended to diagnose MRSA infection nor to guide or monitor treatment for MRSA infections. Performed at [facility], [address] [place], [place] [address]       Radiology Studies: Ir Angiogram Visceral Selective  Result Date: 10/08/2018 INDICATION: 78 year old female with acutely bleeding refractory to endoscopic therapy. She presents for angiogram and embolization. EXAM: IR ULTRASOUND GUIDANCE VASC ACCESS RIGHT; IR EMBO ART VEN HEMORR LYMPH EXTRAV INC GUIDE ROADMAPPING; ADDITIONAL ARTERIOGRAPHY; SELECTIVE VISCERAL ARTERIOGRAPHY 1. Ultrasound-guided access right common femoral artery 2. Catheterization of the celiac artery with arteriogram 3. Catheterization gastroduodenal artery with arteriogram 4. Coil embolization of the gastroduodenal artery MEDICATIONS: 1 units packed red blood cells administered in interventional radiology ANESTHESIA/SEDATION: Moderate (conscious) sedation was employed during this procedure. A total of Versed 0.5 mg and Fentanyl 25 mcg was administered intravenously. Moderate Sedation Time: 41 minutes. The patient's level of consciousness and vital signs were monitored continuously by radiology nursing  throughout the procedure under my direct supervision. CONTRAST:  74mL OMNIPAQUE IOHEXOL 300 MG/ML  SOLN FLUOROSCOPY TIME:  Fluoroscopy Time: 12 minutes 36 seconds (61 mGy). COMPLICATIONS: None immediate. PROCEDURE: Informed consent was obtained from the patient following explanation of the procedure, risks, benefits and alternatives. The patient understands, agrees and consents for the procedure. All questions were addressed. A time out was performed prior to the initiation of the procedure. Maximal barrier sterile technique utilized including caps, mask, sterile gowns, sterile gloves, large sterile drape, hand hygiene, and Betadine prep. The right common femoral artery was interrogated with ultrasound and found to be widely patent. An image was obtained and stored for the medical record. Local anesthesia was attained by infiltration with 1% lidocaine. A small dermatotomy was made. Under real-time sonographic guidance, the vessel was punctured with a 21 gauge micropuncture needle. Using standard technique, the initial micro needle was exchanged over a 0.018 micro wire for a transitional 4 [place] micro sheath. The micro sheath was then exchanged over a 0.035 wire for a 5  [place] vascular sheath. A C2 cobra catheter was advanced over a Bentson wire into the abdominal aorta. C2 cobra catheter could just engage with the origin of the celiac artery. Celiac arteriography was performed. Anatomy appears conventional. No evidence of active bleeding. The C2 cobra catheter could not seat well in the artery origin secondary to steep angulation of the proximal artery. Therefore, the C2 cobra catheter was exchanged for a Sos Omni selective catheter. The SOS and Glidewire were then used to secure access to the celiac axis. A landed microcatheter was then navigated over a Fathom 16 wire in used to select the distal gastroduodenal artery. Arteriography was performed. No evidence of active extravasation at this time. Given the  patient's significant recent bleed and current anticoagulated status, the decision was made to proceed with prophylactic embolization of the gastroduodenal artery. Coil embolization was then performed using a combination of 3 and 4 mm Ruby detachable microcoils. Complete occlusion of the gastroduodenal artery was achieved. The catheters were removed. A limited right common femoral arteriogram demonstrates a high bifurcation. The access is good and within the short segment of common femoral artery and below the inguinal ligament. Hemostasis was attained with the assistance of an Angio-Seal closure device. IMPRESSION: 1. No active arterial extravasation at this time. 2. Prophylactic coil embolization of the gastroduodenal artery. Signed, Criselda Peaches, MD, RPVI [facility] PLAN: Continue to monitor H and H and transfuse as needed. Electronically Signed   By: Jacqulynn Cadet M.D.   On: 10/08/2018 19:53   Ir Angiogram Selective Each Additional Vessel  Result Date: 10/08/2018 INDICATION: 78 year old female with acutely bleeding refractory to endoscopic therapy. She presents for angiogram and embolization. EXAM: IR ULTRASOUND GUIDANCE VASC ACCESS RIGHT; IR EMBO ART VEN HEMORR LYMPH EXTRAV INC GUIDE ROADMAPPING; ADDITIONAL ARTERIOGRAPHY; SELECTIVE VISCERAL ARTERIOGRAPHY 1. Ultrasound-guided access right common femoral artery 2. Catheterization of the celiac artery with arteriogram 3. Catheterization gastroduodenal artery with arteriogram 4. Coil embolization of the gastroduodenal artery MEDICATIONS: 1 units packed red blood cells administered in interventional radiology ANESTHESIA/SEDATION: Moderate (conscious) sedation was employed during this procedure. A total of Versed 0.5 mg and Fentanyl 25 mcg was administered intravenously. Moderate Sedation Time: 41 minutes. The patient's level of consciousness and vital signs were monitored continuously by  radiology nursing throughout the procedure under my direct supervision. CONTRAST:  53mL OMNIPAQUE IOHEXOL 300 MG/ML  SOLN FLUOROSCOPY TIME:  Fluoroscopy Time: 12 minutes 36 seconds (61 mGy). COMPLICATIONS: None immediate. PROCEDURE: Informed consent was obtained from the patient following explanation of the procedure, risks, benefits and alternatives. The patient understands, agrees and consents for the procedure. All questions were addressed. A time out was performed prior to the initiation of the procedure. Maximal barrier sterile technique utilized including caps, mask, sterile gowns, sterile gloves, large sterile drape, hand hygiene, and Betadine prep. The right common femoral artery was interrogated with ultrasound and found to be widely patent. An image was obtained and stored for the medical record. Local anesthesia was attained by infiltration with 1% lidocaine. A small dermatotomy was made. Under real-time sonographic guidance, the vessel was punctured with a 21 gauge micropuncture needle. Using standard technique, the initial micro needle was exchanged over a 0.018 micro wire for a transitional 4 [place] micro sheath. The micro sheath was then exchanged over a 0.035 wire for a 5 [place] vascular sheath. A C2 cobra catheter was advanced over a Bentson wire into the abdominal aorta. C2 cobra catheter could just engage with the  origin of the celiac artery. Celiac arteriography was performed. Anatomy appears conventional. No evidence of active bleeding. The C2 cobra catheter could not seat well in the artery origin secondary to steep angulation of the proximal artery. Therefore, the C2 cobra catheter was exchanged for a Sos Omni selective catheter. The SOS and Glidewire were then used to secure access to the celiac axis. A landed microcatheter was then navigated over a Fathom 16 wire in used to select the distal gastroduodenal artery. Arteriography was performed. No evidence of active extravasation at this time.  Given the patient's significant recent bleed and current anticoagulated status, the decision was made to proceed with prophylactic embolization of the gastroduodenal artery. Coil embolization was then performed using a combination of 3 and 4 mm Ruby detachable microcoils. Complete occlusion of the gastroduodenal artery was achieved. The catheters were removed. A limited right common femoral arteriogram demonstrates a high bifurcation. The access is good and within the short segment of common femoral artery and below the inguinal ligament. Hemostasis was attained with the assistance of an Angio-Seal closure device. IMPRESSION: 1. No active arterial extravasation at this time. 2. Prophylactic coil embolization of the gastroduodenal artery. Signed, Criselda Peaches, MD, RPVI [facility] Radiology PLAN: Continue to monitor H and H and transfuse as needed. Electronically Signed   By: Jacqulynn Cadet M.D.   On: 10/08/2018 19:53   Ir US Guide Vasc Access Right  Result Date: 10/08/2018 INDICATION: 78 year old female with acutely bleeding refractory to endoscopic therapy. She presents for angiogram and embolization. EXAM: IR ULTRASOUND GUIDANCE VASC ACCESS RIGHT; IR EMBO ART VEN HEMORR LYMPH EXTRAV INC GUIDE ROADMAPPING; ADDITIONAL ARTERIOGRAPHY; SELECTIVE VISCERAL ARTERIOGRAPHY 1. Ultrasound-guided access right common femoral artery 2. Catheterization of the celiac artery with arteriogram 3. Catheterization gastroduodenal artery with arteriogram 4. Coil embolization of the gastroduodenal artery MEDICATIONS: 1 units packed red blood cells administered in interventional radiology ANESTHESIA/SEDATION: Moderate (conscious) sedation was employed during this procedure. A total of Versed 0.5 mg and Fentanyl 25 mcg was administered intravenously. Moderate Sedation Time: 41 minutes. The patient's level of consciousness and vital signs were monitored continuously by radiology  nursing throughout the procedure under my direct supervision. CONTRAST:  32mL OMNIPAQUE IOHEXOL 300 MG/ML  SOLN FLUOROSCOPY TIME:  Fluoroscopy Time: 12 minutes 36 seconds (61 mGy). COMPLICATIONS: None immediate. PROCEDURE: Informed consent was obtained from the patient following explanation of the procedure, risks, benefits and alternatives. The patient understands, agrees and consents for the procedure. All questions were addressed. A time out was performed prior to the initiation of the procedure. Maximal barrier sterile technique utilized including caps, mask, sterile gowns, sterile gloves, large sterile drape, hand hygiene, and Betadine prep. The right common femoral artery was interrogated with ultrasound and found to be widely patent. An image was obtained and stored for the medical record. Local anesthesia was attained by infiltration with 1% lidocaine. A small dermatotomy was made. Under real-time sonographic guidance, the vessel was punctured with a 21 gauge micropuncture needle. Using standard technique, the initial micro needle was exchanged over a 0.018 micro wire for a transitional 4 [place] micro sheath. The micro sheath was then exchanged over a 0.035 wire for a 5 [place] vascular sheath. A C2 cobra catheter was advanced over a Bentson wire into the abdominal aorta. C2 cobra catheter could just engage with the origin of the celiac artery. Celiac arteriography was performed. Anatomy appears conventional. No evidence of active bleeding. The C2 cobra catheter could not seat well  in the artery origin secondary to steep angulation of the proximal artery. Therefore, the C2 cobra catheter was exchanged for a Sos Omni selective catheter. The SOS and Glidewire were then used to secure access to the celiac axis. A landed microcatheter was then navigated over a Fathom 16 wire in used to select the distal gastroduodenal artery. Arteriography was performed. No evidence of active extravasation at this time. Given the  patient's significant recent bleed and current anticoagulated status, the decision was made to proceed with prophylactic embolization of the gastroduodenal artery. Coil embolization was then performed using a combination of 3 and 4 mm Ruby detachable microcoils. Complete occlusion of the gastroduodenal artery was achieved. The catheters were removed. A limited right common femoral arteriogram demonstrates a high bifurcation. The access is good and within the short segment of common femoral artery and below the inguinal ligament. Hemostasis was attained with the assistance of an Angio-Seal closure device. IMPRESSION: 1. No active arterial extravasation at this time. 2. Prophylactic coil embolization of the gastroduodenal artery. Signed, Criselda Peaches, MD, RPVI [facility] Radiology PLAN: Continue to monitor H and H and transfuse as needed. Electronically Signed   By: Jacqulynn Cadet M.D.   On: 10/08/2018 19:53   [place] [facility] [employer]  Result Date: 10/08/2018 INDICATION: 78 year old female with acutely bleeding refractory to endoscopic therapy. She presents for angiogram and embolization. EXAM: IR ULTRASOUND GUIDANCE VASC ACCESS RIGHT; IR EMBO ART VEN HEMORR LYMPH EXTRAV INC GUIDE ROADMAPPING; ADDITIONAL ARTERIOGRAPHY; SELECTIVE VISCERAL ARTERIOGRAPHY 1. Ultrasound-guided access right common femoral artery 2. Catheterization of the celiac artery with arteriogram 3. Catheterization gastroduodenal artery with arteriogram 4. Coil embolization of the gastroduodenal artery MEDICATIONS: 1 units packed red blood cells administered in interventional radiology ANESTHESIA/SEDATION: Moderate (conscious) sedation was employed during this procedure. A total of Versed 0.5 mg and Fentanyl 25 mcg was administered intravenously. Moderate Sedation Time: 41 minutes. The patient's level of consciousness and vital signs were monitored  continuously by radiology nursing throughout the procedure under my direct supervision. CONTRAST:  74mL OMNIPAQUE IOHEXOL 300 MG/ML  SOLN FLUOROSCOPY TIME:  Fluoroscopy Time: 12 minutes 36 seconds (61 mGy). COMPLICATIONS: None immediate. PROCEDURE: Informed consent was obtained from the patient following explanation of the procedure, risks, benefits and alternatives. The patient understands, agrees and consents for the procedure. All questions were addressed. A time out was performed prior to the initiation of the procedure. Maximal barrier sterile technique utilized including caps, mask, sterile gowns, sterile gloves, large sterile drape, hand hygiene, and Betadine prep. The right common femoral artery was interrogated with ultrasound and found to be widely patent. An image was obtained and stored for the medical record. Local anesthesia was attained by infiltration with 1% lidocaine. A small dermatotomy was made. Under real-time sonographic guidance, the vessel was punctured with a 21 gauge micropuncture needle. Using standard technique, the initial micro needle was exchanged over a 0.018 micro wire for a transitional 4 [place] micro sheath. The micro sheath was then exchanged over a 0.035 wire for a 5 French vascular sheath. A C2 cobra catheter was advanced over a Bentson wire into the abdominal aorta. C2 cobra catheter could just engage with the origin of the celiac artery. Celiac arteriography was performed. Anatomy appears conventional. No evidence of active bleeding. The C2 cobra catheter could not seat well in the artery origin secondary to steep angulation of the proximal artery. Therefore, the C2 cobra catheter was exchanged  for a Sos Omni selective catheter. The SOS and Glidewire were then used to secure access to the celiac axis. A landed microcatheter was then navigated over a Fathom 16 wire in used to select the distal gastroduodenal artery. Arteriography was performed. No evidence of active  extravasation at this time. Given the patient's significant recent bleed and current anticoagulated status, the decision was made to proceed with prophylactic embolization of the gastroduodenal artery. Coil embolization was then performed using a combination of 3 and 4 mm Ruby detachable microcoils. Complete occlusion of the gastroduodenal artery was achieved. The catheters were removed. A limited right common femoral arteriogram demonstrates a high bifurcation. The access is good and within the short segment of common femoral artery and below the inguinal ligament. Hemostasis was attained with the assistance of an Angio-Seal closure device. IMPRESSION: 1. No active arterial extravasation at this time. 2. Prophylactic coil embolization of the gastroduodenal artery. Signed, Criselda Peaches, MD, RPVI [facility] Radiology PLAN: Continue to monitor H and H and transfuse as needed. Electronically Signed   By: Jacqulynn Cadet M.D.   On: 10/08/2018 19:53      LOS: 2 days   Time spent: More than 50% of that time was spent in counseling and/or coordination of care.  Antonieta Pert, MD [facility]  10/10/2018, 12:50 PM

## 2018-10-10 NOTE — Anesthesia Postprocedure Evaluation (Signed)
Anesthesia Post Note  Patient: [name]  Procedure(s) Performed: ESOPHAGOGASTRODUODENOSCOPY (EGD) (N/A ) HOT HEMOSTASIS (ARGON PLASMA COAGULATION/BICAP) (N/A )     Patient location during evaluation: Endoscopy Anesthesia Type: MAC Level of consciousness: awake and alert Pain management: pain level controlled Vital Signs Assessment: post-procedure vital signs reviewed and stable Respiratory status: spontaneous breathing, nonlabored ventilation, respiratory function stable and patient connected to nasal cannula oxygen Cardiovascular status: blood pressure returned to baseline and stable Postop Assessment: no apparent nausea or vomiting Anesthetic complications: no    Last Vitals:  Vitals:   10/10/18 1049 10/10/18 1100  BP: 110/71 139/71  Pulse: 87 88  Resp: 20 17  Temp:    SpO2:  100%    Last Pain:  Vitals:   10/10/18 1100  TempSrc:   PainSc: 0-No pain                 Kazimierz Springborn L Glennie Rodda

## 2018-10-10 NOTE — Progress Notes (Addendum)
Progress Note  Patient Name: Hannah Roy Date of Encounter: 10/10/2018  Primary Cardiologist: Kate Sable, MD   Subjective   Patient feeling better today. Has not had a BM yet. Denies CP or SOB.   Inpatient Medications    Scheduled Meds:  [MAR Hold] sodium chloride   Intravenous Once   sodium chloride   Intravenous Once   [MAR Hold] sodium chloride   Intravenous Once   [MAR Hold] sodium chloride   Intravenous Once   [MAR Hold] furosemide  20 mg Intravenous Once   [MAR Hold] lidocaine  5 mL Mouth/Throat Once   [MAR Hold] sodium chloride flush  3 mL Intravenous Q12H   Continuous Infusions:  sodium chloride 125 mL/hr at 10/10/18 0424   sodium chloride     sodium chloride     pantoprozole (PROTONIX) infusion 8 mg/hr (10/09/18 2221)   PRN Meds: [MAR Hold] acetaminophen **OR** [MAR Hold] acetaminophen   Vital Signs    Vitals:   10/10/18 0500 10/10/18 0615 10/10/18 0652 10/10/18 0944  BP:  97/64 98/65 128/79  Pulse:  85 82   Resp:  16 (!) 21 20  Temp:  98.5 F (36.9 C) 97.7 F (36.5 C) 98.5 F (36.9 C)  TempSrc:  Oral Oral Oral  SpO2:  98% 98% 100%  Weight: 52.9 kg       Intake/Output Summary (Last 24 hours) at 10/10/2018 0956 Last data filed at 10/10/2018 0600 Gross per 24 hour  Intake 3279.93 ml  Output 800 ml  Net 2479.93 ml   Last 3 Weights 10/10/2018 10/09/2018 10/07/2018  Weight (lbs) 116 lb 11.2 oz 110 lb 14.3 oz 117 lb  Weight (kg) 52.935 kg 50.3 kg 53.071 kg      Telemetry    NSR, HR 80s - Personally Reviewed  ECG    No new - Personally Reviewed  Physical Exam   GEN: No acute distress.   Neck: No JVD Cardiac: RRR, no murmurs, rubs, or gallops.  Respiratory: Clear to auscultation bilaterally. GI: Soft, nontender, non-distended  MS: No edema; No deformity. Neuro:  Nonfocal  Psych: Normal affect   Labs    High Sensitivity Troponin:  No results for input(s): TROPONINIHS in the last 720 hours.    Cardiac EnzymesNo results for  input(s): TROPONINI in the last 168 hours. No results for input(s): TROPIPOC in the last 168 hours.   Chemistry Recent Labs  Lab 10/07/18 1543 10/08/18 0421 10/09/18 0527  NA 141 141 141  K 4.3 4.0 3.4*  CL 107 112* 110  CO2 23 21* 20*  GLUCOSE 106* 101* 121*  BUN 33* 38* 39*  CREATININE 0.88 0.46 0.79  CALCIUM 9.2 7.9* 7.7*  PROT 7.5  --  4.8*  ALBUMIN 4.0  --  2.5*  AST 17  --  13*  ALT 15  --  12  ALKPHOS 74  --  40  BILITOT 0.4  --  0.7  GFRNONAA >60 >60 >60  GFRAA >60 >60 >60  ANIONGAP 11 8 11      Hematology Recent Labs  Lab 10/08/18 1447 10/09/18 0748 10/09/18 1350 10/09/18 1702 10/10/18 0346  WBC 14.7* 10.0  --   --  7.7  RBC 2.26* 2.88*  --   --  2.24*  HGB 6.7* 8.7* 7.5* 7.4* 6.7*  HCT 20.8* 24.9* 22.4* 21.9* 20.3*  MCV 92.0 86.5  --   --  90.6  MCH 29.6 30.2  --   --  29.9  MCHC 32.2 34.9  --   --  33.0  RDW 17.1* 15.3  --   --  15.7*  PLT 173 138*  --   --  122*    BNPNo results for input(s): BNP, PROBNP in the last 168 hours.   DDimer No results for input(s): DDIMER in the last 168 hours.   Radiology    Ir Angiogram Visceral Selective  Result Date: 10/08/2018 INDICATION: 78 year old female with acutely bleeding refractory to endoscopic therapy. She presents for angiogram and embolization. EXAM: IR ULTRASOUND GUIDANCE VASC ACCESS RIGHT; IR EMBO ART VEN HEMORR LYMPH EXTRAV INC GUIDE ROADMAPPING; ADDITIONAL ARTERIOGRAPHY; SELECTIVE VISCERAL ARTERIOGRAPHY 1. Ultrasound-guided access right common femoral artery 2. Catheterization of the celiac artery with arteriogram 3. Catheterization gastroduodenal artery with arteriogram 4. Coil embolization of the gastroduodenal artery MEDICATIONS: 1 units packed red blood cells administered in interventional radiology ANESTHESIA/SEDATION: Moderate (conscious) sedation was employed during this procedure. A total of Versed 0.5 mg and Fentanyl 25 mcg was administered intravenously. Moderate Sedation Time: 41 minutes. The  patient's level of consciousness and vital signs were monitored continuously by radiology nursing throughout the procedure under my direct supervision. CONTRAST:  67mL OMNIPAQUE IOHEXOL 300 MG/ML  SOLN FLUOROSCOPY TIME:  Fluoroscopy Time: 12 minutes 36 seconds (61 mGy). COMPLICATIONS: None immediate. PROCEDURE: Informed consent was obtained from the patient following explanation of the procedure, risks, benefits and alternatives. The patient understands, agrees and consents for the procedure. All questions were addressed. A time out was performed prior to the initiation of the procedure. Maximal barrier sterile technique utilized including caps, mask, sterile gowns, sterile gloves, large sterile drape, hand hygiene, and Betadine prep. The right common femoral artery was interrogated with ultrasound and found to be widely patent. An image was obtained and stored for the medical record. Local anesthesia was attained by infiltration with 1% lidocaine. A small dermatotomy was made. Under real-time sonographic guidance, the vessel was punctured with a 21 gauge micropuncture needle. Using standard technique, the initial micro needle was exchanged over a 0.018 micro wire for a transitional 4 [place] micro sheath. The micro sheath was then exchanged over a 0.035 wire for a 5 French vascular sheath. A C2 cobra catheter was advanced over a Bentson wire into the abdominal aorta. C2 cobra catheter could just engage with the origin of the celiac artery. Celiac arteriography was performed. Anatomy appears conventional. No evidence of active bleeding. The C2 cobra catheter could not seat well in the artery origin secondary to steep angulation of the proximal artery. Therefore, the C2 cobra catheter was exchanged for a Sos Omni selective catheter. The SOS and Glidewire were then used to secure access to the celiac axis. A landed microcatheter was then navigated over a Fathom 16 wire in used to select the distal gastroduodenal  artery. Arteriography was performed. No evidence of active extravasation at this time. Given the patient's significant recent bleed and current anticoagulated status, the decision was made to proceed with prophylactic embolization of the gastroduodenal artery. Coil embolization was then performed using a combination of 3 and 4 mm Ruby detachable microcoils. Complete occlusion of the gastroduodenal artery was achieved. The catheters were removed. A limited right common femoral arteriogram demonstrates a high bifurcation. The access is good and within the short segment of common femoral artery and below the inguinal ligament. Hemostasis was attained with the assistance of an Angio-Seal closure device. IMPRESSION: 1. No active arterial extravasation at this time. 2. Prophylactic coil embolization of the gastroduodenal artery. Signed, Criselda Peaches, MD, [facility]  [facility] Radiology PLAN: Continue to monitor H and H and transfuse as needed. Electronically Signed   By: Jacqulynn Cadet M.D.   On: 10/08/2018 19:53   Ir Angiogram Selective Each Additional Vessel  Result Date: 10/08/2018 INDICATION: 78 year old female with acutely bleeding refractory to endoscopic therapy. She presents for angiogram and embolization. EXAM: IR ULTRASOUND GUIDANCE VASC ACCESS RIGHT; IR EMBO ART VEN HEMORR LYMPH EXTRAV INC GUIDE ROADMAPPING; ADDITIONAL ARTERIOGRAPHY; SELECTIVE VISCERAL ARTERIOGRAPHY 1. Ultrasound-guided access right common femoral artery 2. Catheterization of the celiac artery with arteriogram 3. Catheterization gastroduodenal artery with arteriogram 4. Coil embolization of the gastroduodenal artery MEDICATIONS: 1 units packed red blood cells administered in interventional radiology ANESTHESIA/SEDATION: Moderate (conscious) sedation was employed during this procedure. A total of Versed 0.5 mg and Fentanyl 25 mcg was administered intravenously. Moderate Sedation Time: 41  minutes. The patient's level of consciousness and vital signs were monitored continuously by radiology nursing throughout the procedure under my direct supervision. CONTRAST:  91mL OMNIPAQUE IOHEXOL 300 MG/ML  SOLN FLUOROSCOPY TIME:  Fluoroscopy Time: 12 minutes 36 seconds (61 mGy). COMPLICATIONS: None immediate. PROCEDURE: Informed consent was obtained from the patient following explanation of the procedure, risks, benefits and alternatives. The patient understands, agrees and consents for the procedure. All questions were addressed. A time out was performed prior to the initiation of the procedure. Maximal barrier sterile technique utilized including caps, mask, sterile gowns, sterile gloves, large sterile drape, hand hygiene, and Betadine prep. The right common femoral artery was interrogated with ultrasound and found to be widely patent. An image was obtained and stored for the medical record. Local anesthesia was attained by infiltration with 1% lidocaine. A small dermatotomy was made. Under real-time sonographic guidance, the vessel was punctured with a 21 gauge micropuncture needle. Using standard technique, the initial micro needle was exchanged over a 0.018 micro wire for a transitional 4 [place] micro sheath. The micro sheath was then exchanged over a 0.035 wire for a 5 French vascular sheath. A C2 cobra catheter was advanced over a Bentson wire into the abdominal aorta. C2 cobra catheter could just engage with the origin of the celiac artery. Celiac arteriography was performed. Anatomy appears conventional. No evidence of active bleeding. The C2 cobra catheter could not seat well in the artery origin secondary to steep angulation of the proximal artery. Therefore, the C2 cobra catheter was exchanged for a Sos Omni selective catheter. The SOS and Glidewire were then used to secure access to the celiac axis. A landed microcatheter was then navigated over a Fathom 16 wire in used to select the distal  gastroduodenal artery. Arteriography was performed. No evidence of active extravasation at this time. Given the patient's significant recent bleed and current anticoagulated status, the decision was made to proceed with prophylactic embolization of the gastroduodenal artery. Coil embolization was then performed using a combination of 3 and 4 mm Ruby detachable microcoils. Complete occlusion of the gastroduodenal artery was achieved. The catheters were removed. A limited right common femoral arteriogram demonstrates a high bifurcation. The access is good and within the short segment of common femoral artery and below the inguinal ligament. Hemostasis was attained with the assistance of an Angio-Seal closure device. IMPRESSION: 1. No active arterial extravasation at this time. 2. Prophylactic coil embolization of the gastroduodenal artery. Signed, Criselda Peaches, MD, [facility] Radiology PLAN: Continue to monitor H and H and transfuse as needed. Electronically Signed   By: Jacqulynn Cadet M.D.   On:  10/08/2018 19:53   Ir US Guide Vasc Access Right  Result Date: 10/08/2018 INDICATION: 78 year old female with acutely bleeding refractory to endoscopic therapy. She presents for angiogram and embolization. EXAM: IR ULTRASOUND GUIDANCE VASC ACCESS RIGHT; IR EMBO ART VEN HEMORR LYMPH EXTRAV INC GUIDE ROADMAPPING; ADDITIONAL ARTERIOGRAPHY; SELECTIVE VISCERAL ARTERIOGRAPHY 1. Ultrasound-guided access right common femoral artery 2. Catheterization of the celiac artery with arteriogram 3. Catheterization gastroduodenal artery with arteriogram 4. Coil embolization of the gastroduodenal artery MEDICATIONS: 1 units packed red blood cells administered in interventional radiology ANESTHESIA/SEDATION: Moderate (conscious) sedation was employed during this procedure. A total of Versed 0.5 mg and Fentanyl 25 mcg was administered intravenously. Moderate Sedation Time: 41  minutes. The patient's level of consciousness and vital signs were monitored continuously by radiology nursing throughout the procedure under my direct supervision. CONTRAST:  25mL OMNIPAQUE IOHEXOL 300 MG/ML  SOLN FLUOROSCOPY TIME:  Fluoroscopy Time: 12 minutes 36 seconds (61 mGy). COMPLICATIONS: None immediate. PROCEDURE: Informed consent was obtained from the patient following explanation of the procedure, risks, benefits and alternatives. The patient understands, agrees and consents for the procedure. All questions were addressed. A time out was performed prior to the initiation of the procedure. Maximal barrier sterile technique utilized including caps, mask, sterile gowns, sterile gloves, large sterile drape, hand hygiene, and Betadine prep. The right common femoral artery was interrogated with ultrasound and found to be widely patent. An image was obtained and stored for the medical record. Local anesthesia was attained by infiltration with 1% lidocaine. A small dermatotomy was made. Under real-time sonographic guidance, the vessel was punctured with a 21 gauge micropuncture needle. Using standard technique, the initial micro needle was exchanged over a 0.018 micro wire for a transitional 4 [place] micro sheath. The micro sheath was then exchanged over a 0.035 wire for a 5 French vascular sheath. A C2 cobra catheter was advanced over a Bentson wire into the abdominal aorta. C2 cobra catheter could just engage with the origin of the celiac artery. Celiac arteriography was performed. Anatomy appears conventional. No evidence of active bleeding. The C2 cobra catheter could not seat well in the artery origin secondary to steep angulation of the proximal artery. Therefore, the C2 cobra catheter was exchanged for a Sos Omni selective catheter. The SOS and Glidewire were then used to secure access to the celiac axis. A landed microcatheter was then navigated over a Fathom 16 wire in used to select the distal  gastroduodenal artery. Arteriography was performed. No evidence of active extravasation at this time. Given the patient's significant recent bleed and current anticoagulated status, the decision was made to proceed with prophylactic embolization of the gastroduodenal artery. Coil embolization was then performed using a combination of 3 and 4 mm Ruby detachable microcoils. Complete occlusion of the gastroduodenal artery was achieved. The catheters were removed. A limited right common femoral arteriogram demonstrates a high bifurcation. The access is good and within the short segment of common femoral artery and below the inguinal ligament. Hemostasis was attained with the assistance of an Angio-Seal closure device. IMPRESSION: 1. No active arterial extravasation at this time. 2. Prophylactic coil embolization of the gastroduodenal artery. Signed, Criselda Peaches, MD, [facility] Radiology PLAN: Continue to monitor H and H and transfuse as needed. Electronically Signed   By: Jacqulynn Cadet M.D.   On: 10/08/2018 19:53   [place] Guide Roadmapping  Result Date: 10/08/2018 INDICATION: 78 year old female with acutely  bleeding refractory to endoscopic therapy. She presents for angiogram and embolization. EXAM: IR ULTRASOUND GUIDANCE VASC ACCESS RIGHT; IR EMBO ART VEN HEMORR LYMPH EXTRAV INC GUIDE ROADMAPPING; ADDITIONAL ARTERIOGRAPHY; SELECTIVE VISCERAL ARTERIOGRAPHY 1. Ultrasound-guided access right common femoral artery 2. Catheterization of the celiac artery with arteriogram 3. Catheterization gastroduodenal artery with arteriogram 4. Coil embolization of the gastroduodenal artery MEDICATIONS: 1 units packed red blood cells administered in interventional radiology ANESTHESIA/SEDATION: Moderate (conscious) sedation was employed during this procedure. A total of Versed 0.5 mg and Fentanyl 25 mcg was administered  intravenously. Moderate Sedation Time: 41 minutes. The patient's level of consciousness and vital signs were monitored continuously by radiology nursing throughout the procedure under my direct supervision. CONTRAST:  69mL OMNIPAQUE IOHEXOL 300 MG/ML  SOLN FLUOROSCOPY TIME:  Fluoroscopy Time: 12 minutes 36 seconds (61 mGy). COMPLICATIONS: None immediate. PROCEDURE: Informed consent was obtained from the patient following explanation of the procedure, risks, benefits and alternatives. The patient understands, agrees and consents for the procedure. All questions were addressed. A time out was performed prior to the initiation of the procedure. Maximal barrier sterile technique utilized including caps, mask, sterile gowns, sterile gloves, large sterile drape, hand hygiene, and Betadine prep. The right common femoral artery was interrogated with ultrasound and found to be widely patent. An image was obtained and stored for the medical record. Local anesthesia was attained by infiltration with 1% lidocaine. A small dermatotomy was made. Under real-time sonographic guidance, the vessel was punctured with a 21 gauge micropuncture needle. Using standard technique, the initial micro needle was exchanged over a 0.018 micro wire for a transitional 4 [place] micro sheath. The micro sheath was then exchanged over a 0.035 wire for a 5 French vascular sheath. A C2 cobra catheter was advanced over a Bentson wire into the abdominal aorta. C2 cobra catheter could just engage with the origin of the celiac artery. Celiac arteriography was performed. Anatomy appears conventional. No evidence of active bleeding. The C2 cobra catheter could not seat well in the artery origin secondary to steep angulation of the proximal artery. Therefore, the C2 cobra catheter was exchanged for a Sos Omni selective catheter. The SOS and Glidewire were then used to secure access to the celiac axis. A landed microcatheter was then navigated over a Fathom 16  wire in used to select the distal gastroduodenal artery. Arteriography was performed. No evidence of active extravasation at this time. Given the patient's significant recent bleed and current anticoagulated status, the decision was made to proceed with prophylactic embolization of the gastroduodenal artery. Coil embolization was then performed using a combination of 3 and 4 mm Ruby detachable microcoils. Complete occlusion of the gastroduodenal artery was achieved. The catheters were removed. A limited right common femoral arteriogram demonstrates a high bifurcation. The access is good and within the short segment of common femoral artery and below the inguinal ligament. Hemostasis was attained with the assistance of an Angio-Seal closure device. IMPRESSION: 1. No active arterial extravasation at this time. 2. Prophylactic coil embolization of the gastroduodenal artery. Signed, Criselda Peaches, MD, [facility] Radiology PLAN: Continue to monitor H and H and transfuse as needed. Electronically Signed   By: Jacqulynn Cadet M.D.   On: 10/08/2018 19:53    Cardiac Studies   Echo 10/08/18   1. The left ventricle has normal systolic function with an ejection fraction of 60-65%. The cavity size was normal. There is moderately increased left ventricular wall thickness. Left ventricular diastolic Doppler  parameters are consistent with  restrictive filling. No evidence of left ventricular regional wall motion abnormalities. 2. Left atrial size was mildly dilated. 3. A 29 mm [facility] Mitral bovine bioprosthetic valve is present in the mitral position. No significant mitral regurgitation. Valve function grossly normal with normal mean gradient. 4. A 56mm Edwards Magna Ease bovine bioprosthesis is present in the aortic position. No perivalvular leak noted. Sinuses of Valsalva seen with normal aortic root size. LVOT is small. valve function is grossly  normal with normal mean gradient. No aortic  regurgitation. 5. The right ventricle has normal systolic function. The cavity was normal. There is no increase in right ventricular wall thickness. Right ventricular systolic pressure is normal with an estimated pressure of 21.7 mmHg. 6. The aortic root is normal in size and structure. 7. 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 Status post tricuspid valve repair. There is mild tricuspid regurgitation. 9. Moderate pericardial effusion. 10. The pericardial effusion is circumferential.    Patient Profile     78 y.o. 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  Thought to be in a fib RVR but was SR.   Also with CHB post op and BB and amiodarone were stopped. D/c'd on coumadin. Hgb 6.6 on admission and received I unit PRBCs. 6/30 succesful CTA angio wit embolization. EGD scheduled for today due to dropping HGB 6.7.  Assessment & Plan    1. PAF - s/p Maze procedure 09/05/2018. 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 - At admission INR 2.2. Today INR 1.2. Patient received IV Vit K 2.5 6/30 -Plan to restart coumadin based on GI clearance  2. Acute GI bleed - duodenal ulcer on IV protonix -6/29 with Hgb 6.6 (was 9.8 on 6/4) . 6/30 succesful CTA angio wit embolization -Today HGB dropped to 6.7,  received 1 unit PRBCs. -EGD today showed oozing ulcer  3. Moderate Pericardial Effusion - found on 10/08/2018 echo -plan for f/u echo in 1 week  4. Rheumatic valvular heart disease s/paortic valve replacement with a bioprosthetictissuevalve, mitral valve replacement with a bovinebioprosthetictissuevalve, tricuspid valve repair with ringannuloplasty, Maze procedure, and 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 Roxy Manns -stable -AC on hold -when safe from GI standpoint would resume ASA  5. Hypotension -improving   For questions or updates, please contact Macy Please consult [email or web address] for contact info under        Signed, Cadence Ninfa Meeker, PA-C  10/10/2018, 9:56 AM    ---------------------------------------------------------------------------------------------   History and all data above reviewed.  Patient examined.  I agree with the findings as above.  Khila O Sizemore had a drop in Hb again today to 6.7, repeat scope performed.   Constitutional: No acute distress Cardiovascular: regular rhythm, normal rate, no murmurs. S1 and S2 normal. Radial pulses normal bilaterally. No jugular venous distention.  Respiratory: clear to auscultation bilaterally GI : normal bowel sounds, soft and nontender. No distention.  MSK: extremities warm, well perfused. No edema.  NEURO: grossly nonfocal exam, moves all extremities. PSYCH: alert and oriented x 3, normal mood and affect.   All available labs, radiology testing, previous records reviewed. 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 Can restart when safe from GI perspective. Will likely need ASA 81 mg daily as well when safe from GI for prosthetic valves indefinitely. Both on hold for now.   PAF - in sinus rhythm, monitor closely.   Moderate pericardial effusion - does not appear to have tamponade physiology by echo or physical exam. May need follow up echo in 1 week to ensure no change. Will monitor closely given recent surgery.   Time Spent Directly with Patient:  I have spent a total of 25 minutes with the patient reviewing hospital notes, telemetry, EKGs, labs and examining the patient as well as establishing an assessment and plan that was discussed personally with the patient.  > 50% of time was spent in direct patient care.  Length of Stay:  LOS: 2 days   Elouise Munroe, MD [facility] 6:57 PM  10/10/2018

## 2018-10-10 NOTE — Progress Notes (Signed)
Patient and nursing report one very small passage of black stool last evening.  Denies abd pain, chest pain or dyspnea.  BP 105/70, HR 80's Alert, conversational and comfortable.  Hgb dropped down to 6.7 ovn and currently getting 1 unit PRBCs ( to be done about an hour from now)  INR 1.2  plt 122K  I am concerned about the decreasing Hgb, and think we need to know for certain if there is any ongoing bleeding.  Recommended EGD this morning, and she is agreeable.   The benefits and risks of the planned procedure were described in detail with the patient or (when appropriate) their health care proxy.  Risks were outlined as including, but not limited to, bleeding, infection, perforation, adverse medication reaction leading to cardiac or pulmonary decompensation.  The limitation of incomplete mucosal visualization was also discussed.  No guarantees or warranties were given.  Patient called her daughter while I was there, I also spoke with her and gave update.  Will call family after procedure as well.  Nursing updated.

## 2018-10-10 NOTE — Anesthesia Procedure Notes (Signed)
Procedure Name: MAC Date/Time: 10/10/2018 10:06 AM Performed by: Neldon Newport, CRNA Pre-anesthesia Checklist: Timeout performed, Patient being monitored, Suction available, Emergency Drugs available and Patient identified Patient Re-evaluated:Patient Re-evaluated prior to induction Oxygen Delivery Method: Nasal cannula

## 2018-10-11 LAB — BPAM RBC
Blood Product Expiration Date: 202007312359
Blood Product Expiration Date: 202008012359
ISSUE DATE / TIME: 202006302115
ISSUE DATE / TIME: 202007020626
Unit Type and Rh: 5100
Unit Type and Rh: 5100

## 2018-10-11 LAB — TYPE AND SCREEN
ABO/RH(D): O POS
Antibody Screen: NEGATIVE
Unit division: 0
Unit division: 0

## 2018-10-11 LAB — CBC
HCT: 23.9 % — ABNORMAL LOW (ref 36.0–46.0)
Hemoglobin: 7.9 g/dL — ABNORMAL LOW (ref 12.0–15.0)
MCH: 29.8 pg (ref 26.0–34.0)
MCHC: 33.1 g/dL (ref 30.0–36.0)
MCV: 90.2 fL (ref 80.0–100.0)
Platelets: 123 10*3/uL — ABNORMAL LOW (ref 150–400)
RBC: 2.65 MIL/uL — ABNORMAL LOW (ref 3.87–5.11)
RDW: 16.5 % — ABNORMAL HIGH (ref 11.5–15.5)
WBC: 7.2 10*3/uL (ref 4.0–10.5)
nRBC: 0 % (ref 0.0–0.2)

## 2018-10-11 LAB — HEMOGLOBIN AND HEMATOCRIT, BLOOD
HCT: 27.4 % — ABNORMAL LOW (ref 36.0–46.0)
Hemoglobin: 9.2 g/dL — ABNORMAL LOW (ref 12.0–15.0)

## 2018-10-11 LAB — PROTIME-INR
INR: 1.2 (ref 0.8–1.2)
Prothrombin Time: 15.2 seconds (ref 11.4–15.2)

## 2018-10-11 MED ORDER — PANTOPRAZOLE SODIUM 40 MG PO TBEC
40.0000 mg | DELAYED_RELEASE_TABLET | Freq: Two times a day (BID) | ORAL | Status: DC
  Administered 2018-10-11 – 2018-10-12 (×2): 40 mg via ORAL
  Filled 2018-10-11 (×2): qty 1

## 2018-10-11 MED ORDER — TRAZODONE HCL 50 MG PO TABS
50.0000 mg | ORAL_TABLET | Freq: Once | ORAL | Status: AC
  Administered 2018-10-11: 23:00:00 50 mg via ORAL
  Filled 2018-10-11: qty 1

## 2018-10-11 MED ORDER — DOCUSATE SODIUM 100 MG PO CAPS
100.0000 mg | ORAL_CAPSULE | Freq: Every day | ORAL | Status: DC
  Administered 2018-10-12: 09:00:00 100 mg via ORAL
  Filled 2018-10-11: qty 1

## 2018-10-11 NOTE — Plan of Care (Signed)

## 2018-10-11 NOTE — Progress Notes (Signed)
Progress Note  Patient Name: Hannah Roy Date of Encounter: 10/11/2018  Primary Cardiologist: Kate Sable, MD   Subjective   Feeling well, hemoglobin 9 today, had a BM with "old blood".   Inpatient Medications    Scheduled Meds: . sodium chloride   Intravenous Once  . sodium chloride   Intravenous Once  . sodium chloride   Intravenous Once  . docusate sodium  100 mg Oral Daily  . sodium chloride flush  3 mL Intravenous Q12H   Continuous Infusions: . sodium chloride Stopped (10/11/18 0411)  . pantoprozole (PROTONIX) infusion 8 mg/hr (10/11/18 0730)   PRN Meds: acetaminophen **OR** acetaminophen   Vital Signs    Vitals:   10/10/18 1049 10/10/18 1100 10/10/18 2113 10/11/18 0616  BP: 110/71 139/71 97/62 101/66  Pulse: 87 88 86 84  Resp: 20 17    Temp:   98.4 F (36.9 C) 99 F (37.2 C)  TempSrc:   Oral Oral  SpO2:  100% 99% 98%  Weight:    55.5 kg    Intake/Output Summary (Last 24 hours) at 10/11/2018 0959 Last data filed at 10/11/2018 0408 Gross per 24 hour  Intake 1410.55 ml  Output 400 ml  Net 1010.55 ml   Last 3 Weights 10/11/2018 10/10/2018 10/09/2018  Weight (lbs) 122 lb 4.8 oz 116 lb 11.2 oz 110 lb 14.3 oz  Weight (kg) 55.475 kg 52.935 kg 50.3 kg      Telemetry    NSR, HR 80s - Personally Reviewed  ECG    No new - Personally Reviewed  Physical Exam   Constitutional: No acute distress ENMT: normal dentition, moist mucous membranes Cardiovascular: regular rhythm, normal rate, no murmurs. S1 and S2 normal. Radial pulses normal bilaterally. No jugular venous distention.  Chest: sternotomy c/d/i Respiratory: clear to auscultation bilaterally GI : normal bowel sounds, soft and nontender. No distention.   MSK: extremities warm, well perfused. No edema.  NEURO: grossly nonfocal exam, moves all extremities. PSYCH: alert and oriented x 3, normal mood and affect.   Labs    High Sensitivity Troponin:  No results for input(s): TROPONINIHS in the last 720  hours.    Cardiac EnzymesNo results for input(s): TROPONINI in the last 168 hours. No results for input(s): TROPIPOC in the last 168 hours.   Chemistry Recent Labs  Lab 10/07/18 1543 10/08/18 0421 10/09/18 0527  NA 141 141 141  K 4.3 4.0 3.4*  CL 107 112* 110  CO2 23 21* 20*  GLUCOSE 106* 101* 121*  BUN 33* 38* 39*  CREATININE 0.88 0.46 0.79  CALCIUM 9.2 7.9* 7.7*  PROT 7.5  --  4.8*  ALBUMIN 4.0  --  2.5*  AST 17  --  13*  ALT 15  --  12  ALKPHOS 74  --  40  BILITOT 0.4  --  0.7  GFRNONAA >60 >60 >60  GFRAA >60 >60 >60  ANIONGAP 11 8 11      Hematology Recent Labs  Lab 10/09/18 0748  10/10/18 0346 10/10/18 1541 10/11/18 0706  WBC 10.0  --  7.7  --  7.2  RBC 2.88*  --  2.24*  --  2.65*  HGB 8.7*   < > 6.7* 9.0* 7.9*  HCT 24.9*   < > 20.3* 26.2* 23.9*  MCV 86.5  --  90.6  --  90.2  MCH 30.2  --  29.9  --  29.8  MCHC 34.9  --  33.0  --  33.1  RDW 15.3  --  15.7*  --  16.5*  PLT 138*  --  122*  --  123*   < > = values in this interval not displayed.    BNPNo results for input(s): BNP, PROBNP in the last 168 hours.   DDimer No results for input(s): DDIMER in the last 168 hours.   Radiology    No results found.  Cardiac Studies   Echo 10/08/18   1. The left ventricle has normal systolic function with an ejection fraction of 60-65%. The cavity size was normal. There is moderately increased left ventricular wall thickness. Left ventricular diastolic Doppler parameters are consistent with  restrictive filling. No evidence of left ventricular regional wall motion abnormalities. 2. Left atrial size was mildly dilated. 3. A 29 mm [facility] Mitral bovine bioprosthetic valve is present in the mitral position. No significant mitral regurgitation. Valve function grossly normal with normal mean gradient. 4. A 48mm Edwards Magna Ease bovine bioprosthesis is present in the aortic position. No perivalvular leak noted. Sinuses of Valsalva seen with normal aortic  root size. LVOT is small. valve function is grossly normal with normal mean gradient. No aortic  regurgitation. 5. The right ventricle has normal systolic function. The cavity was normal. There is no increase in right ventricular wall thickness. Right ventricular systolic pressure is normal with an estimated pressure of 21.7 mmHg. 6. The aortic root is normal in size and structure. 7. 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 Status post tricuspid valve repair. There is mild tricuspid regurgitation. 9. Moderate pericardial effusion. 10. The pericardial effusion is circumferential.    Patient Profile     78 y.o. 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  Thought to be in a fib RVR but was SR.   Also with CHB post op and BB and amiodarone were stopped. D/c'd on coumadin. Hgb 6.6 on admission and received I unit PRBCs. 6/30 succesful CTA angio wit embolization. Repeat scope with Hb 6.7 showed oozing with no new bleeding.   Assessment & Plan    1. PAF - No afib this admission, has had previous MAZE and LAA cipping.  - will discontinue coumadin for now, pt has appt with Dr. Roxy Manns 7/13 can revisit risk at that time.   2. Acute GI bleed - discussed with GI. Will plan for repeat scope in 3-4 weeks after dismissal. If no signs of bleeding, plan to resume ASA 81 mg daily for the indication of bioprosthetic valves.   3. Moderate Pericardial Effusion - found on 10/08/2018 echo - appears small to moderate on my  independent review.  - bedside echo performed with handheld echo 10/11/2018. No change in size of effusion on side by side comparison.  Consider repeat echo at follow up, in 1-2 weeks to reassess effusion.   4. Rheumatic valvular heart disease s/paortic valve replacement with a bioprosthetictissuevalve, mitral valve replacement with a bovinebioprosthetictissuevalve, tricuspid valve repair with ringannuloplasty, Maze procedure, and 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 Roxy Manns - stable - see above for plan on when to add back ASA.   [facility] will sign  off.   Medication Recommendations:  Hold coumadin and ASA at this time, revisit at follow up after repeat scope and clearance from bleeding standpoint.  Other recommendations (labs, testing, etc): repeat focused echo in 1-2 weeks.  Follow up as an outpatient: 7/13 - Dr. Roxy Manns; 7/20 - M. Lenze.     Signed, Elouise Munroe, MD  10/11/2018, 9:59 AM

## 2018-10-11 NOTE — Progress Notes (Signed)
900 Patient had medium black stool with bleeding noted in hat mixed with urine this am. Hgb 7.9 this morning and was 9.0 yesterday. Notified Dr. Maren Beach and Dr. Loletha Carrow by text page of above information.  1120 Received call from Dr. Loletha Carrow that he received text and spoke to Dr. Maren Beach. Will keep patient another day.

## 2018-10-11 NOTE — Progress Notes (Addendum)
[facility] Progress Note  Chief Complaint: Duodenal ulcer with hemorrhage  History:  Hannah Roy is feeling well today.  She denies abdominal pain, has had no hematemesis or melena in last 24 hours.  She tolerated a regular diet all day yesterday and with breakfast today. She denies chest pain or dyspnea.  Cardiology consultant was also seeing her when I arrived, and they performed a bedside echocardiogram revealing a small stable pericardial effusion.  ROS: No fever, no dysuria  Objective:   Current Facility-Administered Medications:  .  0.9 %  sodium chloride infusion (Manually program via Guardrails IV Fluids), , Intravenous, Once, Danis, Keela Rubert L III, MD .  0.9 %  sodium chloride infusion (Manually program via Guardrails IV Fluids), , Intravenous, Once, Danis, Estill Cotta III, MD .  0.9 %  sodium chloride infusion (Manually program via Guardrails IV Fluids), , Intravenous, Once, Danis, Estill Cotta III, MD .  0.9 %  sodium chloride infusion, , Intravenous, Continuous, Danis, Kirke Corin, MD, Stopped at 10/11/18 0411 .  acetaminophen (TYLENOL) tablet 650 mg, 650 mg, Oral, Q6H PRN **OR** acetaminophen (TYLENOL) suppository 650 mg, 650 mg, Rectal, Q6H PRN, Danis, Estill Cotta III, MD .  docusate sodium (COLACE) capsule 100 mg, 100 mg, Oral, Daily, Kc, Ramesh, MD .  pantoprazole (PROTONIX) 80 mg in sodium chloride 0.9 % 250 mL (0.32 mg/mL) infusion, 8 mg/hr, Intravenous, Continuous, Danis, Anniston Nellums L III, MD, Last Rate: 25 mL/hr at 10/11/18 0730, 8 mg/hr at 10/11/18 0730 .  sodium chloride flush (NS) 0.9 % injection 3 mL, 3 mL, Intravenous, Q12H, Danis, Estill Cotta III, MD, 3 mL at 10/10/18 2132  . sodium chloride Stopped (10/11/18 0411)  . pantoprozole (PROTONIX) infusion 8 mg/hr (10/11/18 0730)     Vital signs in last 24 hrs: Vitals:   10/10/18 2113 10/11/18 0616  BP: 97/62 101/66  Pulse: 86 84  Resp:    Temp: 98.4 F (36.9 C) 99 F (37.2 C)  SpO2: 99% 98%    Intake/Output Summary (Last 24 hours)  at 10/11/2018 0947 Last data filed at 10/11/2018 0408 Gross per 24 hour  Intake 1410.55 ml  Output 400 ml  Net 1010.55 ml     Physical Exam she is well-appearing  HEENT: sclera anicteric, oral mucosa without lesions  Neck: supple, no thyromegaly, JVD or lymphadenopathy  Cardiac: RRR without murmurs, S1S2 heard, no peripheral edema  Pulm: clear to auscultation bilaterally, normal RR and effort noted  Abdomen: soft, no tenderness, with active bowel sounds. No guarding or palpable hepatosplenomegaly  Skin; warm and dry, pale, no jaundice  Recent Labs:  CBC Latest Ref Rng & Units 10/11/2018 10/10/2018 10/10/2018  WBC 4.0 - 10.5 K/uL 7.2 - 7.7  Hemoglobin 12.0 - 15.0 g/dL 7.9(L) 9.0(L) 6.7(LL)  Hematocrit 36.0 - 46.0 % 23.9(L) 26.2(L) 20.3(L)  Platelets 150 - 400 K/uL 123(L) - 122(L)    Recent Labs  Lab 10/11/18 0706  INR 1.2   CMP Latest Ref Rng & Units 10/09/2018 10/08/2018 10/07/2018  Glucose 70 - 99 mg/dL 121(H) 101(H) 106(H)  BUN 8 - 23 mg/dL 39(H) 38(H) 33(H)  Creatinine 0.44 - 1.00 mg/dL 0.79 0.46 0.88  Sodium 135 - 145 mmol/L 141 141 141  Potassium 3.5 - 5.1 mmol/L 3.4(L) 4.0 4.3  Chloride 98 - 111 mmol/L 110 112(H) 107  CO2 22 - 32 mmol/L 20(L) 21(L) 23  Calcium 8.9 - 10.3 mg/dL 7.7(L) 7.9(L) 9.2  Total Protein 6.5 - 8.1 g/dL 4.8(L) - 7.5  Total Bilirubin 0.3 -  1.2 mg/dL 0.7 - 0.4  Alkaline Phos 38 - 126 U/L 40 - 74  AST 15 - 41 U/L 13(L) - 17  ALT 0 - 44 U/L 12 - 15     Radiologic studies: Bedside echocardiogram noted above shows small stable pleural effusion  @ASSESSMENTPLANBEGIN @ Assessment: Duodenal ulcer with hemorrhage.  Bleeding has stopped after interventional radiology and subsequent upper endoscopy. Acute blood loss anemia  Patient was initially coagulopathic on Coumadin, cardiology consult and no longer feels that medicine is necessary because the patient is out of A. fib after Maze procedure has bioprosthetic valves.  They might want her to go back  on aspirin at some point, and my recommendations regarding endoscopic follow-up to help answer this question are outlined in my endoscopy reports.   Plan: The patient can go home today from my perspective, to follow-up with the [facility]. Twice daily oral PPI I think she can stay off iron tablets for now, since she received an iron load with the PRBC transfusion. Primary care or [facility] can follow-up her blood counts soon and decide if iron supplement is necessary. They should also follow up results of H pylori serology and treat if positive. Reports should be forwarded to them for coordination of follow-up care.  Total time 25 minutes Nelida Meuse III Office: [phone]

## 2018-10-11 NOTE — Progress Notes (Signed)
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 Roxy Manns , dced from [facility] on 09/13/18, he admitted to [facility] on 10/07/2018 with dizziness, dyspnea and melena transferred back to [facility] on 10/08/2018 due to duodenal ulcer requiring interventional radiology to embolize  6/30 patient underwent successful embolization of GDA 7/1- BM that is black 7/2- hb drifted 6.7- 1 unit prbc transfusion and repeat EGD done with APC duodenal ulcer  Subjective: Nurse reports patient had blackish blood with mild red blood as well.  Patient feels great has been tolerating diet.  Her hemoglobin dropped down to 7.9 from 9.0.  She received 1 unit of PRBC when her hemoglobin was 6.7 yesterday.   Has no other new complaints. Assessment & Plan:  ABLA from bleeding duodenal ulcer: Baseline hemoglobin around 12 G gm in 08/2018.  Status post total 5 units PRBC.  Hemoglobin this morning 7.9 g-from 9.0.  We will recheck this evening and in the morning.  We will monitor her overnight make sure hemoglobin stays stable.  Recent Labs  Lab 10/09/18 1350 10/09/18 1702 10/10/18 0346 10/10/18 1541 10/11/18 0706  HGB 7.5* 7.4* 6.7* 9.0* 7.9*  HCT 22.4* 21.9* 20.3* 26.2* 23.9*   Actively bleeding duodenal ulcer: Dr Joesph Fillers dcussed with GI at [facility], Howie Ill at [facility]/ IR at [facility]-  (Dr.Wagner)/Cardiology Dr. Domenic Polite (at [facility]) and CT Surgery Dr Remus Loffler- transferred to [facility] 6/30 and s/p successful embolization of GDA.  Reports having black stool Hemoglobin further down trended and repeat EGD done 7/2-s/p APC oozing duodenal ulcer Continue on Protonix drip.  Patient will be completing Protonix drips today, changed to PPI twice daily.  Discussed with Dr. Sharla Kidney from GI and will monitor the patient overnight and put back on clear liquid diet. H./h today pm and in am Per GI "Repeat outpatient EGD by [facility] in 2-4 weeks to assess healing"   Hypotension: Blood pressure had been soft and finally has stabilized.  Mild hypokalemia:repleted  Rheumatic valvular heart disease status post aortic valve replacement with by bioprosthetic tissue valve 08/28/2026: Per Dr. Roxy Manns --no need for heparin bridge and okay to  hold anticoagulation as patient has bioprosthetic bovine valve (not mechanical valve)  Ascending thoracic aorta aneurysm: recently repaired by Dr.Owen on 09/05/2018, repeat echo 6/30 no significant findings   PAF s/p Maze Procedure on 09/05/2018: maintaining in sinus rhythm. Coumadin on hold due to GI bleed, INR is normal. status post vitamin K 2.51.Currently not on AV nodal blocking agent due to recent episode of type II AV block, and also with hypotension.  Cardiology following closely and appreciate inputs. At this time patient will need outpatient GI follow-up and re-scope in  before initiating aspirin indefinitely, plus minus Coumadin which will be addressed by cardiology as outpatient.   Chronic anticoagulation,previously on Eliquis for A. fib, post cardiac surgery she was placed on Coumadin, INR   was 2.2,s/p vitamin K 2.5 mg x 1, INR down to 1.3. AC on hold for  now: And will need to be reassessed as outpatient after repeating EGD in 2 to 4 weeks..   DVT prophylaxis: SCD Code Status: FULL Family Communication: POC discussed with the patient.  I had called and  updated patient's husband.  Disposition Plan: remains inpatient pending clinical improvement.  Anticipating discharge tomorrow if hemoglobin remains stable and no more bleeding episode.  Consultants:  Gi (Dr Oneida Alar at [facility]) and Howie Ill at [employer] IR at [facility]- (Dr.Wagner)/Cardiology Dr. Domenic Polite (at [facility]) and CT Surgery Dr C. Roxy Manns at [facility] GI Cardiology  Procedures:  10/08/18  EGD: Impression: - UGI BLEED DUE TO ULCER AT JUNCTION D1/D2, NOT AMENABLE TO ENDOSCOPIC THERAPY- H PYLORI STATUS UNKNOWN  6/30:Status post IR embolization of GD.  7/2 EGD Normal esophagus. - Erosive gastropathy. - Oozing duodenal ulcer. Treated with argon plasma coagulation (APC).  ECHO 10/08/18  1. The left ventricle has normal systolic function with an ejection fraction of 60-65%. The cavity size was normal. There is moderately increased left ventricular wall thickness. Left ventricular diastolic Doppler parameters are consistent with  restrictive filling. No evidence of left ventricular regional wall motion abnormalities. 2. Left atrial size was mildly dilated. 3. A 29 mm [facility] Mitral bovine bioprosthetic valve is present in the mitral position. No significant mitral regurgitation. Valve function grossly normal with normal mean gradient. 4. A 43mm Edwards Magna Ease bovine bioprosthesis is present in the aortic position. No perivalvular leak noted. Sinuses of Valsalva seen with normal aortic root size. LVOT is small. valve function is grossly normal with normal mean gradient. No aortic  regurgitation. 5. The right ventricle has normal systolic function. The cavity was normal. There is no increase in right ventricular wall thickness. Right ventricular systolic pressure is normal with an estimated pressure of 21.7 mmHg. 6. The aortic root is normal in size and structure. 7. 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 Status post tricuspid valve repair. There is mild tricuspid regurgitation. 9.  Moderate pericardial effusion. 10. The pericardial effusion is circumferential   Antimicrobials: Anti-infectives (From admission, onward)   None       Objective: Vitals:   10/10/18 1049 10/10/18 1100 10/10/18 2113 10/11/18 0616  BP: 110/71 139/71 97/62 101/66  Pulse: 87 88 86 84  Resp: 20 17    Temp:   98.4 F (36.9 C) 99 F (37.2 C)  TempSrc:   Oral Oral  SpO2:  100% 99% 98%  Weight:    55.5 kg    Intake/Output Summary (Last 24 hours) at 10/11/2018 1117 Last data filed at 10/11/2018 0408 Gross per 24 hour  Intake 903 ml  Output 400 ml  Net 503 ml   Filed Weights   10/09/18 0411 10/10/18 0500 10/11/18 0616  Weight: 50.3 kg 52.9 kg 55.5 kg   Weight change: 2.54 kg  Body mass index is 23.11 kg/m.  Intake/Output from previous day: 07/02 0701 - 07/03 0700 In: 1756.6 [I.V.:1410.6; Blood:346] Out: 400 [Urine:400] Intake/Output this shift: No intake/output data recorded.  Examination: General exam: Calm, comfortable, calm comfortable not in distress.  Thin.   HEENT:Oral mucosa moist, Ear/Nose WNL grossly, dentition normal. Respiratory system: Bilateral equal air entry, no crackles and wheezing, no use of accessory muscle. Cardiovascular system: regular rate and rhythm, S1 & S2 heard, No JVD/murmurs. Gastrointestinal system: Abdomen soft, non-tender, non-distended, BS +. No hepatosplenomegaly palpable. Nervous System:Alert, awake and oriented at baseline. Able to move UE and LE, sensation intact. Extremities: No edema, distal peripheral pulses palpable.  Skin:  No rashes,no icterus. MSK: Normal muscle bulk,tone, power  Medications:  Scheduled Meds:  sodium chloride   Intravenous Once   sodium chloride   Intravenous Once   sodium chloride   Intravenous Once   docusate sodium  100 mg Oral Daily   pantoprazole  40 mg Oral BID   sodium chloride flush  3 mL Intravenous Q12H   Continuous Infusions:  pantoprozole (PROTONIX) infusion 8 mg/hr (10/11/18 0730)     Data Reviewed: I have personally reviewed following labs and imaging studies  CBC: Recent Labs  Lab 10/08/18 1306 10/08/18 1447 10/09/18 0748 10/09/18 1350 10/09/18 1702 10/10/18 0346 10/10/18 1541 10/11/18 0706  WBC 8.4 14.7* 10.0  --   --  7.7  --  7.2  HGB 7.2* 6.7* 8.7* 7.5* 7.4* 6.7* 9.0* 7.9*  HCT 22.5* 20.8* 24.9* 22.4* 21.9* 20.3* 26.2* 23.9*  MCV 94.1 92.0 86.5  --   --  90.6  --  90.2  PLT 192 173 138*  --   --  122*  --  161*   Basic Metabolic Panel: Recent Labs  Lab 10/07/18 1343 10/07/18 1543 10/08/18 0421 10/09/18 0527  NA 139 141 141 141  K 4.7 4.3 4.0 3.4*  CL 105 107 112* 110  CO2 24 23 21* 20*  GLUCOSE 109* 106* 101* 121*  BUN 31* 33* 38* 39*  CREATININE 0.62 0.88 0.46 0.79  CALCIUM 9.0 9.2 7.9* 7.7*   GFR: Estimated Creatinine Clearance: 45.1 mL/min (by C-G formula based on SCr of 0.79 mg/dL). Liver Function Tests: Recent Labs  Lab 10/07/18 1543 10/09/18 0527  AST 17 13*  ALT 15 12  ALKPHOS 74 40  BILITOT 0.4 0.7  PROT 7.5 4.8*  ALBUMIN 4.0 2.5*   No results for input(s): LIPASE, AMYLASE in the last 168 hours. No results for input(s): AMMONIA in the last 168 hours. Coagulation Profile: Recent Labs  Lab 10/08/18 0421 10/08/18 1447 10/09/18 0527 10/10/18 0346 10/11/18 0706  INR 2.2* 1.7* 1.3* 1.2 1.2   Cardiac Enzymes: No results for input(s): CKTOTAL, CKMB, CKMBINDEX, TROPONINI in the last 168 hours. BNP (last 3 results) No results for input(s): PROBNP in the last 8760 hours. HbA1C: No results for input(s): HGBA1C in the last 72 hours. CBG: No results for input(s): GLUCAP in the last 168 hours. Lipid Profile: No results for input(s): CHOL, HDL, LDLCALC, TRIG, CHOLHDL, LDLDIRECT in the last 72 hours. Thyroid Function Tests: No results for input(s): TSH, T4TOTAL, FREET4, T3FREE, THYROIDAB in the last 72 hours. Anemia Panel: No results for input(s): VITAMINB12, FOLATE, FERRITIN, TIBC, IRON, RETICCTPCT in the last 72  hours. Sepsis Labs: No results for input(s): PROCALCITON, LATICACIDVEN in the last 168 hours.  Recent Results (from the past 240 hour(s))  SARS Coronavirus 2 (CEPHEID - Performed in [place] hospital lab), Hosp Order     Status: None   Collection Time: 10/07/18  6:00 PM   Specimen: Nasopharyngeal Swab  Result Value Ref Range Status   SARS Coronavirus 2 NEGATIVE NEGATIVE Final    Comment: (NOTE) If result is NEGATIVE SARS-CoV-2 target nucleic acids are NOT DETECTED. The SARS-CoV-2 RNA is generally detectable in upper and lower  respiratory specimens during the acute phase of infection. The lowest  concentration of SARS-CoV-2 viral copies this assay can detect is 250  copies / mL. A negative result does not preclude SARS-CoV-2 infection  and should not be used as the sole basis for treatment or other  patient management decisions.  A negative result may  occur with  improper specimen collection / handling, submission of specimen other  than nasopharyngeal swab, presence of viral mutation(s) within the  areas targeted by this assay, and inadequate number of viral copies  (<250 copies / mL). A negative result must be combined with clinical  observations, patient history, and epidemiological information. If result is POSITIVE SARS-CoV-2 target nucleic acids are DETECTED. The SARS-CoV-2 RNA is generally detectable in upper and lower  respiratory specimens dur ing the acute phase of infection.  Positive  results are indicative of active infection with SARS-CoV-2.  Clinical  correlation with patient history and other diagnostic information is  necessary to determine patient infection status.  Positive results do  not rule out bacterial infection or co-infection with other viruses. If result is PRESUMPTIVE POSTIVE SARS-CoV-2 nucleic acids MAY BE PRESENT.   A presumptive positive result was obtained on the submitted specimen  and confirmed on repeat testing.  While 2019 novel coronavirus   (SARS-CoV-2) nucleic acids may be present in the submitted sample  additional confirmatory testing may be necessary for epidemiological  and / or clinical management purposes  to differentiate between  SARS-CoV-2 and other Sarbecovirus currently known to infect humans.  If clinically indicated additional testing with an alternate test  methodology [phone]) is advised. The SARS-CoV-2 RNA is generally  detectable in upper and lower respiratory sp ecimens during the acute  phase of infection. The expected result is Negative. Fact Sheet for Patients:  StrictlyIdeas.no Fact Sheet for Healthcare Providers: BankingDealers.co.za This test is not yet approved or cleared by the [place] FDA and has been authorized for detection and/or diagnosis of SARS-CoV-2 by FDA under an Emergency Use Authorization (EUA).  This EUA will remain in effect (meaning this test can be used) for the duration of the COVID-19 declaration under Section 564(b)(1) of the Act, 21 U.S.C. section 360bbb-3(b)(1), unless the authorization is terminated or revoked sooner. Performed at [facility], [address] [place], [place] [address]   MRSA PCR Screening     Status: None   Collection Time: 10/08/18  6:37 AM   Specimen: Nasal Mucosa; Nasopharyngeal  Result Value Ref Range Status   MRSA by PCR NEGATIVE NEGATIVE Final    Comment:        The GeneXpert MRSA Assay (FDA approved for NASAL specimens only), is one component of a comprehensive MRSA colonization surveillance program. It is not intended to diagnose MRSA infection nor to guide or monitor treatment for MRSA infections. Performed at [facility], [address], [place], [place] [address]       Radiology Studies: No results found.    LOS: 3 days   Time spent: More than 50% of that time was spent in counseling and/or coordination of care.  Antonieta Pert, MD [facility]  10/11/2018, 11:17 AM

## 2018-10-12 ENCOUNTER — Encounter (HOSPITAL_COMMUNITY): Payer: Self-pay | Admitting: Gastroenterology

## 2018-10-12 LAB — CBC
HCT: 23.1 % — ABNORMAL LOW (ref 36.0–46.0)
Hemoglobin: 7.8 g/dL — ABNORMAL LOW (ref 12.0–15.0)
MCH: 30.7 pg (ref 26.0–34.0)
MCHC: 33.8 g/dL (ref 30.0–36.0)
MCV: 90.9 fL (ref 80.0–100.0)
Platelets: 135 10*3/uL — ABNORMAL LOW (ref 150–400)
RBC: 2.54 MIL/uL — ABNORMAL LOW (ref 3.87–5.11)
RDW: 16.4 % — ABNORMAL HIGH (ref 11.5–15.5)
WBC: 6.7 10*3/uL (ref 4.0–10.5)
nRBC: 0 % (ref 0.0–0.2)

## 2018-10-12 MED ORDER — PANTOPRAZOLE SODIUM 40 MG PO TBEC: 40.0000 mg | DELAYED_RELEASE_TABLET | Freq: Two times a day (BID) | ORAL | 0 refills | Status: DC

## 2018-10-12 NOTE — Progress Notes (Signed)
Pt given education including but not limited to site care, meds, & f/t appmts & s/s  Of gi bleed. All questions were answered. Education verified via teachback method. Pt's belongings were gathered & given to pt. Hoover Brunette, RN

## 2018-10-12 NOTE — Progress Notes (Signed)
Progress Note  Patient Name: Hannah Roy Date of Encounter: 10/12/2018  Primary Cardiologist: Kate Sable, MD   Subjective   No chest pain or sob. "I feel good."  Inpatient Medications    Scheduled Meds: . sodium chloride   Intravenous Once  . sodium chloride   Intravenous Once  . sodium chloride   Intravenous Once  . docusate sodium  100 mg Oral Daily  . pantoprazole  40 mg Oral BID  . sodium chloride flush  3 mL Intravenous Q12H   Continuous Infusions:  PRN Meds: acetaminophen **OR** acetaminophen   Vital Signs    Vitals:   10/11/18 1434 10/11/18 2055 10/12/18 0619 10/12/18 0810  BP:  125/76 110/70   Pulse:  88 80   Resp:    13  Temp:  98.9 F (37.2 C) 97.8 F (36.6 C)   TempSrc:  Oral Oral   SpO2:  98% 99%   Weight:   54.5 kg   Height: 5\' 1"  (1.549 m)       Intake/Output Summary (Last 24 hours) at 10/12/2018 1032 Last data filed at 10/12/2018 9735 Gross per 24 hour  Intake 853.95 ml  Output 1100 ml  Net -246.05 ml   Filed Weights   10/10/18 0500 10/11/18 0616 10/12/18 0619  Weight: 52.9 kg 55.5 kg 54.5 kg    Telemetry    NSR - Personally Reviewed  ECG    none - Personally Reviewed  Physical Exam   GEN: No acute distress.   Neck: No JVD Cardiac: RRR Respiratory: no increased work of breathing. GI: Soft, nontender, non-distended  MS: No edema; No deformity. Neuro:  Nonfocal  Psych: Normal affect   Labs    Chemistry Recent Labs  Lab 10/07/18 1543 10/08/18 0421 10/09/18 0527  NA 141 141 141  K 4.3 4.0 3.4*  CL 107 112* 110  CO2 23 21* 20*  GLUCOSE 106* 101* 121*  BUN 33* 38* 39*  CREATININE 0.88 0.46 0.79  CALCIUM 9.2 7.9* 7.7*  PROT 7.5  --  4.8*  ALBUMIN 4.0  --  2.5*  AST 17  --  13*  ALT 15  --  12  ALKPHOS 74  --  40  BILITOT 0.4  --  0.7  GFRNONAA >60 >60 >60  GFRAA >60 >60 >60  ANIONGAP 11 8 11      Hematology Recent Labs  Lab 10/10/18 0346  10/11/18 0706 10/11/18 1316 10/12/18 0240  WBC 7.7  --  7.2   --  6.7  RBC 2.24*  --  2.65*  --  2.54*  HGB 6.7*   < > 7.9* 9.2* 7.8*  HCT 20.3*   < > 23.9* 27.4* 23.1*  MCV 90.6  --  90.2  --  90.9  MCH 29.9  --  29.8  --  30.7  MCHC 33.0  --  33.1  --  33.8  RDW 15.7*  --  16.5*  --  16.4*  PLT 122*  --  123*  --  135*   < > = values in this interval not displayed.    Cardiac EnzymesNo results for input(s): TROPONINI in the last 168 hours. No results for input(s): TROPIPOC in the last 168 hours.   BNPNo results for input(s): BNP, PROBNP in the last 168 hours.   DDimer No results for input(s): DDIMER in the last 168 hours.   Radiology    No results found.  Cardiac Studies   none  Patient Profile  78 y.o. female admitted with anemia due to acute GI bleed in the setting of bioprosthetic valve in aortic and mitral position   Assessment & Plan    1. PAF - she is maintaining nsr. Hold anti-coagulation. Can be restarted as an outpatient. 2. Pericardial effusion - not thought to be hemodynamically significant.  3. GI bleeding/Anemia - her hemoglobin is up a net of a gram. I suspect she had more bleeding.  [facility] will sign off.   Medication Recommendations:  Hold ASA and warfarin Other recommendations (labs, testing, etc):  none Follow up as an outpatient:  Dr. Jacinta Shoe in 3-4 weeks.  For questions or updates, please contact [place] Please consult [email or web address] for contact info under Cardiology/STEMI.   Signed, Cristopher Peru, MD  10/12/2018, 10:32 AM  Patient ID: Hannah Roy, female   DOB: 1941/01/22, 78 y.o.   MRN: [id number]

## 2018-10-12 NOTE — Plan of Care (Signed)
  Problem: Bowel/Gastric: Goal: Will show no signs and symptoms of gastrointestinal bleeding Outcome: Adequate for Discharge   Problem: Education: Goal: Ability to identify signs and symptoms of gastrointestinal bleeding will improve Outcome: Completed/Met   Problem: Fluid Volume: Goal: Will show no signs and symptoms of excessive bleeding Outcome: Completed/Met   Problem: Clinical Measurements: Goal: Complications related to the disease process, condition or treatment will be avoided or minimized Outcome: Completed/Met

## 2018-10-12 NOTE — Plan of Care (Signed)
  Problem: Elimination: Goal: Will not experience complications related to bowel motility Outcome: Adequate for Discharge   Problem: Education: Goal: Knowledge of General Education information will improve Description: Including pain rating scale, medication(s)/side effects and non-pharmacologic comfort measures Outcome: Completed/Met   Problem: Health Behavior/Discharge Planning: Goal: Ability to manage health-related needs will improve Outcome: Completed/Met   Problem: Clinical Measurements: Goal: Ability to maintain clinical measurements within normal limits will improve Outcome: Completed/Met Goal: Will remain free from infection Outcome: Completed/Met Goal: Diagnostic test results will improve Outcome: Completed/Met Goal: Respiratory complications will improve Outcome: Completed/Met Goal: Cardiovascular complication will be avoided Outcome: Completed/Met   Problem: Activity: Goal: Risk for activity intolerance will decrease Outcome: Completed/Met   Problem: Coping: Goal: Level of anxiety will decrease Outcome: Completed/Met   Problem: Elimination: Goal: Will not experience complications related to urinary retention Outcome: Completed/Met   Problem: Pain Managment: Goal: General experience of comfort will improve Outcome: Completed/Met   Problem: Safety: Goal: Ability to remain free from injury will improve Outcome: Completed/Met   Problem: Skin Integrity: Goal: Risk for impaired skin integrity will decrease Outcome: Completed/Met   Problem: Education: Goal: Ability to identify signs and symptoms of gastrointestinal bleeding will improve Outcome: Completed/Met   Problem: Bowel/Gastric: Goal: Will show no signs and symptoms of gastrointestinal bleeding Outcome: Completed/Met   Problem: Fluid Volume: Goal: Will show no signs and symptoms of excessive bleeding Outcome: Completed/Met   Problem: Clinical Measurements: Goal: Complications related to the  disease process, condition or treatment will be avoided or minimized Outcome: Completed/Met

## 2018-10-12 NOTE — TOC Transition Note (Signed)
Transition of Care [facility]) - CM/SW Discharge Note   Patient Details  Name: Hannah Roy MRN: [id number] Date of Birth: April 27, 1940  [facility] CM/SW Contact:  Claudie Leach, RN Phone Number: 10/12/2018, 11:35 AM   Clinical Narrative:    Pt to return home with spouse.  Patient's daughter, Lattie Haw, chooses [facility] for [facility] services.  Lattie Haw states patient has all necessary DME and she will be contact person for [facility] agency.   Final next level of care: [place] Barriers to Discharge: No Barriers Identified   Patient Goals and CMS Choice Patient states their goals for this hospitalization and ongoing recovery are:: to go back home CMS Medicare.gov Compare Post Acute Care list provided to:: Patient Represenative (must comment)(Lisa) Choice offered to / list presented to : Adult Children   Discharge Plan and Services    HH Arranged: [place] Agency: [place] ([facility]) Date [place]: 10/12/18 Time [place]: 1135 Representative spoke with at [place]: [place]    Readmission Risk Interventions Readmission Risk Prevention Plan 09/13/2018  Transportation Screening Complete  PCP or Specialist Appt within 5-7 Days Complete  Home Care Screening Complete  Medication Review (RN CM) Complete  Some recent data might be hidden

## 2018-10-12 NOTE — Discharge Summary (Signed)
Physician Discharge Summary  Hannah Roy XBD:[id number] DOB: [birth year] DOA: 10/07/2018  PCP: Kathyrn Drown, MD  Admit date: 10/07/2018 Discharge date: 10/12/2018  Admitted From: home Disposition:  home  Recommendations for Outpatient Follow-up:  1. Follow up with PCP in 1-2 weeks. 2. Please obtain CBC in one week from PCP 3. 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 Roxy Manns , dced from [facility] on 09/13/18, he admitted to [facility] on 10/07/2018 with dizziness, dyspnea and melena transferred back to [facility] on 10/08/2018 due to duodenal ulcer requiring interventional radiology to embolize  6/30 patient underwent successful embolization of GDA 7/1- BM that is black 7/2- hb drifted 6.7- 1 unit prbc transfusion and repeat EGD done with APC duodenal ulcer 7/4 patient was monitored overnight hemoglobin at this time high 7 range intermittently in 9 range/?.  Subjective: Doing well no new complaints.  Made eager to go home this morning.   No bowel movements no episodes of bleeding.  Assessment & Plan:  ABLA from bleeding duodenal ulcer: Baseline hemoglobin around 12 G gm in 08/2018.  Status post total 5 units PRBC.  Hemoglobin this morning 7.9 g-from  9.0.  We will recheck this evening and in the morning.  Hemoglobin was 6.7 on 723 post monitor PRBC and went up to 9.0 however in the evening 7.9 then again 9.2 and this morning 7.8.  With 1 unit transfusion expected to go in the 7.8 range FROM 6.7, this is stable hemoglobin.  Patient will continue Protonix twice daily.  Recent Labs  Lab 10/10/18 0346 10/10/18 1541 10/11/18 0706 10/11/18 1316 10/12/18 0240  HGB 6.7* 9.0* 7.9* 9.2* 7.8*  HCT 20.3* 26.2* 23.9* 27.4* 23.1*   Actively bleeding duodenal ulcer: Dr Joesph Fillers dcussed with GI at [facility], Howie Ill at [facility]/ IR at [facility]- (Dr.Wagner)/Cardiology Dr. Domenic Polite (at [facility]) and CT Surgery Dr Remus Loffler- transferred to [facility] 6/30 and s/p successful embolization of GDA.  Reports having black stool Hemoglobin further down trended and repeat EGD done 7/2-s/p APC oozing duodenal ulcer Continue on Protonix drip.  Patient will be completing Protonix drips today, changed to PPI twice daily.  Discussed with Dr. Sharla Kidney from GI and will monitor the patient overnight and put back on clear liquid diet. H./h today pm and in am Per GI "Repeat outpatient EGD by [facility] in 2-4 weeks to assess healing"  Discussed with GI Dr Loletha Carrow this morning okay for discharge home and she will follow-up with [facility]  Hypotension: Blood pressure has been soft but now stable.  Resume home meds.  Mild hypokalemia:repleted  Rheumatic valvular heart disease status post aortic valve replacement with by bioprosthetic tissue valve 76/20/2028: Per Dr. Roxy Manns --no need for heparin bridge and okay to  hold anticoagulation as patient has bioprosthetic bovine valve (not mechanical valve)  Ascending thoracic aorta aneurysm: recently repaired by Dr.Owen on 09/05/2018, repeat echo 6/30 no significant findings  PAF s/p Maze Procedure on 09/05/2018: maintaining in sinus rhythm. Coumadin on hold due to GI bleed, INR is normal. status post vitamin K 2.51.Currently not on AV nodal blocking  agent due to recent episode of type II AV block, and also with hypotension.  Cardiology following closely and appreciate inputs. At this time patient will need outpatient GI follow-up and re-scope in  before initiating aspirin indefinitely, plus minus Coumadin which will be addressed by cardiology as outpatient.   Chronic anticoagulation,previously on Eliquis for A. fib, post cardiac surgery she was placed on Coumadin, INR   was 2.2,s/p vitamin K 2.5 mg x 1, INR down to 1.3. AC on hold for now: And will need to be reassessed as outpatient after repeating EGD in 2 to 4 weeks.  DVT prophylaxis:SCD. Code Status: FULL. Family Communication:POC discussed with the patient.  I had called and updated patient's husband.  On the day of discharge called patient's daughter to discuss about discharge recommendation further follow-up as per daughter's request.  Disposition Plan: Plan for home once okay with GI  Consultants:  Gi (Dr Oneida Alar at [facility]) and Howie Ill at [employer] IR at [facility]- (Dr.Wagner)/Cardiology Dr. Domenic Polite (at [facility]) and CT Surgery Dr C. Roxy Manns at [facility] Cardiology  Procedures:  10/08/18  EGD: Impression: - UGI BLEED DUE TO ULCER AT JUNCTION D1/D2, NOT AMENABLE TO ENDOSCOPIC THERAPY- H PYLORI STATUS UNKNOWN  6/30:Status post IR embolization of GD.  7/2 EGD Normal esophagus. - Erosive gastropathy. - Oozing duodenal ulcer. Treated with argon plasma coagulation ([facility]).  ECHO 10/08/18  1. The left ventricle has normal systolic function with an ejection fraction of 60-65%. The cavity size was normal. There is moderately increased left ventricular wall thickness. Left ventricular diastolic Doppler parameters are consistent with  restrictive filling. No evidence of left ventricular regional wall motion abnormalities. 2. Left atrial size was mildly dilated. 3. A 29 mm [facility] Mitral bovine bioprosthetic valve is present in the mitral position. No significant mitral  regurgitation. Valve function grossly normal with normal mean gradient. 4. A 81mm Edwards Magna Ease bovine bioprosthesis is present in the aortic position. No perivalvular leak noted. Sinuses of Valsalva seen with normal aortic root size. LVOT is small. valve function is grossly normal with normal mean gradient. No aortic  regurgitation. 5. The right ventricle has normal systolic function. The cavity was normal. There is no increase in right ventricular wall thickness. Right ventricular systolic pressure is normal with an estimated pressure of 21.7 mmHg. 6. The aortic root is normal in size and structure. 7. 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 Status post tricuspid valve repair. There is mild tricuspid regurgitation. 9. Moderate pericardial effusion. 10. The pericardial effusion is circumferential   Antimicrobials: Anti-infectives (From admission, onward)   None      Discharge Diagnoses:  Principal Problem:   Symptomatic anemia Active Problems:   Persistent atrial fibrillation   S/P aortic + mitral valve replacement with bioprosthetic valves + tricuspid valve repair + maze procedure + repair ascending aortic aneurysm   Melena   Paroxysmal A-fib [facility])    Discharge Instructions  Discharge Instructions    Diet - low sodium heart healthy   Complete by: As directed    Discharge instructions   Complete by: As directed    Please call call MD or return to ER for similar or worsening recurring problem that brought you to hospital or if any fever,nausea/vomiting,abdominal pain, uncontrolled pain, chest pain,  shortness of breath  or any other alarming symptoms.  Please follow-up your doctor as instructed in a week time and call the office for appointment.  Please follow-up with GI at [facility] for repeat Endoscopy in 2 to 4 weeks to make sure ulcer is healing and further recommendation regarding resuming aspirin/warfarin and also contact cardiology office for blood thinner  resumption.  Please avoid alcohol, smoking, or any other illicit substance and maintain healthy habits including taking your regular medications as prescribed.  You were cared for by a hospitalist during your hospital stay. If you have any questions about your discharge medications or the care you received while you were in the hospital after you are discharged, you can call the unit and ask to speak with the hospitalist on call if the hospitalist that took care of you is not available.  Once you are discharged, your primary care physician will handle any further medical issues. Please note that NO REFILLS for any discharge medications will be authorized once you are discharged, as it is imperative that you return to your primary care physician (or establish a relationship with a primary care physician if you do not have one) for your aftercare needs so that they can reassess your need for medications and monitor your lab values   Increase activity slowly   Complete by: As directed      Allergies as of 10/12/2018      Reactions   Bee Venom Anaphylaxis   Boniva [ibandronic Acid] Nausea And Vomiting, Other (See Comments)   Upset stomach, flu like symptoms    Lisinopril Cough   Latex Rash      Medication List    STOP taking these medications   aspirin 81 MG EC tablet   warfarin 2.5 MG tablet Commonly known as: COUMADIN     TAKE these medications   bisacodyl 5 MG EC tablet Commonly known as: DULCOLAX Take 5 mg by mouth daily.   ferrous [id number] C-folic acid capsule Commonly known as: TRINSICON / FOLTRIN Take 1 capsule by mouth daily with breakfast.   ondansetron 4 MG tablet Commonly known as: Zofran Take 1 tablet (4 mg total) by mouth every 8 (eight) hours as needed for nausea or vomiting.   pantoprazole 40 MG tablet Commonly known as: PROTONIX Take 1 tablet (40 mg total) by mouth 2 (two) times daily.   traMADol 50 MG tablet Commonly known as: ULTRAM Take 1  tablet (50 mg total) by mouth every 4 (four) hours as needed for moderate pain.       Allergies  Allergen Reactions  . Bee Venom Anaphylaxis  . Boniva [Ibandronic Acid] Nausea And Vomiting and Other (See Comments)    Upset stomach, flu like symptoms   . Lisinopril Cough  . Latex Rash     Procedures/Studies: Ir Angiogram Visceral Selective  Result Date: 10/08/2018 INDICATION: 78 year old female with acutely bleeding refractory to endoscopic therapy. She presents for angiogram and embolization. EXAM: IR ULTRASOUND GUIDANCE VASC ACCESS RIGHT; IR EMBO ART VEN HEMORR LYMPH EXTRAV INC GUIDE ROADMAPPING; ADDITIONAL ARTERIOGRAPHY; SELECTIVE VISCERAL ARTERIOGRAPHY 1. Ultrasound-guided access right common femoral artery 2. Catheterization of the celiac artery with arteriogram 3. Catheterization gastroduodenal artery with arteriogram 4. Coil embolization of the gastroduodenal artery MEDICATIONS: 1 units packed red blood cells administered in interventional radiology ANESTHESIA/SEDATION: Moderate (conscious) sedation was employed during this procedure. A total of Versed 0.5 mg and Fentanyl 25 mcg was administered intravenously. Moderate Sedation Time: 41 minutes. The patient's level of consciousness and vital signs were monitored  continuously by radiology nursing throughout the procedure under my direct supervision. CONTRAST:  52mL OMNIPAQUE IOHEXOL 300 MG/ML  SOLN FLUOROSCOPY TIME:  Fluoroscopy Time: 12 minutes 36 seconds (61 mGy). COMPLICATIONS: None immediate. PROCEDURE: Informed consent was obtained from the patient following explanation of the procedure, risks, benefits and alternatives. The patient understands, agrees and consents for the procedure. All questions were addressed. A time out was performed prior to the initiation of the procedure. Maximal barrier sterile technique utilized including caps, mask, sterile gowns, sterile gloves, large sterile drape, hand hygiene, and Betadine prep. The right  common femoral artery was interrogated with ultrasound and found to be widely patent. An image was obtained and stored for the medical record. Local anesthesia was attained by infiltration with 1% lidocaine. A small dermatotomy was made. Under real-time sonographic guidance, the vessel was punctured with a 21 gauge micropuncture needle. Using standard technique, the initial micro needle was exchanged over a 0.018 micro wire for a transitional 4 [place] micro sheath. The micro sheath was then exchanged over a 0.035 wire for a 5 French vascular sheath. A C2 cobra catheter was advanced over a Bentson wire into the abdominal aorta. C2 cobra catheter could just engage with the origin of the celiac artery. Celiac arteriography was performed. Anatomy appears conventional. No evidence of active bleeding. The C2 cobra catheter could not seat well in the artery origin secondary to steep angulation of the proximal artery. Therefore, the C2 cobra catheter was exchanged for a Sos Omni selective catheter. The SOS and Glidewire were then used to secure access to the celiac axis. A landed microcatheter was then navigated over a Fathom 16 wire in used to select the distal gastroduodenal artery. Arteriography was performed. No evidence of active extravasation at this time. Given the patient's significant recent bleed and current anticoagulated status, the decision was made to proceed with prophylactic embolization of the gastroduodenal artery. Coil embolization was then performed using a combination of 3 and 4 mm Ruby detachable microcoils. Complete occlusion of the gastroduodenal artery was achieved. The catheters were removed. A limited right common femoral arteriogram demonstrates a high bifurcation. The access is good and within the short segment of common femoral artery and below the inguinal ligament. Hemostasis was attained with the assistance of an Angio-Seal closure device. IMPRESSION: 1. No active arterial extravasation at  this time. 2. Prophylactic coil embolization of the gastroduodenal artery. Signed, Criselda Peaches, MD, RPVI [facility] Radiology PLAN: Continue to monitor H and H and transfuse as needed. Electronically Signed   By: Jacqulynn Cadet M.D.   On: 10/08/2018 19:53   Ir Angiogram Selective Each Additional Vessel  Result Date: 10/08/2018 INDICATION: 78 year old female with acutely bleeding refractory to endoscopic therapy. She presents for angiogram and embolization. EXAM: IR ULTRASOUND GUIDANCE VASC ACCESS RIGHT; IR EMBO ART VEN HEMORR LYMPH EXTRAV INC GUIDE ROADMAPPING; ADDITIONAL ARTERIOGRAPHY; SELECTIVE VISCERAL ARTERIOGRAPHY 1. Ultrasound-guided access right common femoral artery 2. Catheterization of the celiac artery with arteriogram 3. Catheterization gastroduodenal artery with arteriogram 4. Coil embolization of the gastroduodenal artery MEDICATIONS: 1 units packed red blood cells administered in interventional radiology ANESTHESIA/SEDATION: Moderate (conscious) sedation was employed during this procedure. A total of Versed 0.5 mg and Fentanyl 25 mcg was administered intravenously. Moderate Sedation Time: 41 minutes. The patient's level of consciousness and vital signs were monitored continuously by radiology nursing throughout the procedure under my direct supervision. CONTRAST:  42mL OMNIPAQUE IOHEXOL 300 MG/ML  SOLN FLUOROSCOPY TIME:  Fluoroscopy Time:  12 minutes 36 seconds (61 mGy). COMPLICATIONS: None immediate. PROCEDURE: Informed consent was obtained from the patient following explanation of the procedure, risks, benefits and alternatives. The patient understands, agrees and consents for the procedure. All questions were addressed. A time out was performed prior to the initiation of the procedure. Maximal barrier sterile technique utilized including caps, mask, sterile gowns, sterile gloves, large sterile drape, hand hygiene, and Betadine prep.  The right common femoral artery was interrogated with ultrasound and found to be widely patent. An image was obtained and stored for the medical record. Local anesthesia was attained by infiltration with 1% lidocaine. A small dermatotomy was made. Under real-time sonographic guidance, the vessel was punctured with a 21 gauge micropuncture needle. Using standard technique, the initial micro needle was exchanged over a 0.018 micro wire for a transitional 4 [place] micro sheath. The micro sheath was then exchanged over a 0.035 wire for a 5 French vascular sheath. A C2 cobra catheter was advanced over a Bentson wire into the abdominal aorta. C2 cobra catheter could just engage with the origin of the celiac artery. Celiac arteriography was performed. Anatomy appears conventional. No evidence of active bleeding. The C2 cobra catheter could not seat well in the artery origin secondary to steep angulation of the proximal artery. Therefore, the C2 cobra catheter was exchanged for a Sos Omni selective catheter. The SOS and Glidewire were then used to secure access to the celiac axis. A landed microcatheter was then navigated over a Fathom 16 wire in used to select the distal gastroduodenal artery. Arteriography was performed. No evidence of active extravasation at this time. Given the patient's significant recent bleed and current anticoagulated status, the decision was made to proceed with prophylactic embolization of the gastroduodenal artery. Coil embolization was then performed using a combination of 3 and 4 mm Ruby detachable microcoils. Complete occlusion of the gastroduodenal artery was achieved. The catheters were removed. A limited right common femoral arteriogram demonstrates a high bifurcation. The access is good and within the short segment of common femoral artery and below the inguinal ligament. Hemostasis was attained with the assistance of an Angio-Seal closure device. IMPRESSION: 1. No active arterial  extravasation at this time. 2. Prophylactic coil embolization of the gastroduodenal artery. Signed, Criselda Peaches, MD, RPVI [facility] Radiology PLAN: Continue to monitor H and H and transfuse as needed. Electronically Signed   By: Jacqulynn Cadet M.D.   On: 10/08/2018 19:53   Ir US Guide Vasc Access Right  Result Date: 10/08/2018 INDICATION: 78 year old female with acutely bleeding refractory to endoscopic therapy. She presents for angiogram and embolization. EXAM: IR ULTRASOUND GUIDANCE VASC ACCESS RIGHT; IR EMBO ART VEN HEMORR LYMPH EXTRAV INC GUIDE ROADMAPPING; ADDITIONAL ARTERIOGRAPHY; SELECTIVE VISCERAL ARTERIOGRAPHY 1. Ultrasound-guided access right common femoral artery 2. Catheterization of the celiac artery with arteriogram 3. Catheterization gastroduodenal artery with arteriogram 4. Coil embolization of the gastroduodenal artery MEDICATIONS: 1 units packed red blood cells administered in interventional radiology ANESTHESIA/SEDATION: Moderate (conscious) sedation was employed during this procedure. A total of Versed 0.5 mg and Fentanyl 25 mcg was administered intravenously. Moderate Sedation Time: 41 minutes. The patient's level of consciousness and vital signs were monitored continuously by radiology nursing throughout the procedure under my direct supervision. CONTRAST:  49mL OMNIPAQUE IOHEXOL 300 MG/ML  SOLN FLUOROSCOPY TIME:  Fluoroscopy Time: 12 minutes 36 seconds (61 mGy). COMPLICATIONS: None immediate. PROCEDURE: Informed consent was obtained from the patient following explanation of the procedure, risks, benefits and  alternatives. The patient understands, agrees and consents for the procedure. All questions were addressed. A time out was performed prior to the initiation of the procedure. Maximal barrier sterile technique utilized including caps, mask, sterile gowns, sterile gloves, large sterile drape, hand hygiene, and Betadine prep.  The right common femoral artery was interrogated with ultrasound and found to be widely patent. An image was obtained and stored for the medical record. Local anesthesia was attained by infiltration with 1% lidocaine. A small dermatotomy was made. Under real-time sonographic guidance, the vessel was punctured with a 21 gauge micropuncture needle. Using standard technique, the initial micro needle was exchanged over a 0.018 micro wire for a transitional 4 [place] micro sheath. The micro sheath was then exchanged over a 0.035 wire for a 5 French vascular sheath. A C2 cobra catheter was advanced over a Bentson wire into the abdominal aorta. C2 cobra catheter could just engage with the origin of the celiac artery. Celiac arteriography was performed. Anatomy appears conventional. No evidence of active bleeding. The C2 cobra catheter could not seat well in the artery origin secondary to steep angulation of the proximal artery. Therefore, the C2 cobra catheter was exchanged for a Sos Omni selective catheter. The SOS and Glidewire were then used to secure access to the celiac axis. A landed microcatheter was then navigated over a Fathom 16 wire in used to select the distal gastroduodenal artery. Arteriography was performed. No evidence of active extravasation at this time. Given the patient's significant recent bleed and current anticoagulated status, the decision was made to proceed with prophylactic embolization of the gastroduodenal artery. Coil embolization was then performed using a combination of 3 and 4 mm Ruby detachable microcoils. Complete occlusion of the gastroduodenal artery was achieved. The catheters were removed. A limited right common femoral arteriogram demonstrates a high bifurcation. The access is good and within the short segment of common femoral artery and below the inguinal ligament. Hemostasis was attained with the assistance of an Angio-Seal closure device. IMPRESSION: 1. No active arterial  extravasation at this time. 2. Prophylactic coil embolization of the gastroduodenal artery. Signed, Criselda Peaches, MD, RPVI [facility] Radiology PLAN: Continue to monitor H and H and transfuse as needed. Electronically Signed   By: Jacqulynn Cadet M.D.   On: 10/08/2018 19:53   [place] Guide Roadmapping  Result Date: 10/08/2018 INDICATION: 78 year old female with acutely bleeding refractory to endoscopic therapy. She presents for angiogram and embolization. EXAM: IR ULTRASOUND GUIDANCE VASC ACCESS RIGHT; IR EMBO ART VEN HEMORR LYMPH EXTRAV INC GUIDE ROADMAPPING; ADDITIONAL ARTERIOGRAPHY; SELECTIVE VISCERAL ARTERIOGRAPHY 1. Ultrasound-guided access right common femoral artery 2. Catheterization of the celiac artery with arteriogram 3. Catheterization gastroduodenal artery with arteriogram 4. Coil embolization of the gastroduodenal artery MEDICATIONS: 1 units packed red blood cells administered in interventional radiology ANESTHESIA/SEDATION: Moderate (conscious) sedation was employed during this procedure. A total of Versed 0.5 mg and Fentanyl 25 mcg was administered intravenously. Moderate Sedation Time: 41 minutes. The patient's level of consciousness and vital signs were monitored continuously by radiology nursing throughout the procedure under my direct supervision. CONTRAST:  71mL OMNIPAQUE IOHEXOL 300 MG/ML  SOLN FLUOROSCOPY TIME:  Fluoroscopy Time: 12 minutes 36 seconds (61 mGy). COMPLICATIONS: None immediate. PROCEDURE: Informed consent was obtained from the patient following explanation of the procedure, risks, benefits and alternatives. The patient understands, agrees and consents for the procedure. All questions were addressed. A time out was performed  prior to the initiation of the procedure. Maximal barrier sterile technique utilized including caps, mask, sterile gowns, sterile gloves, large sterile drape, hand  hygiene, and Betadine prep. The right common femoral artery was interrogated with ultrasound and found to be widely patent. An image was obtained and stored for the medical record. Local anesthesia was attained by infiltration with 1% lidocaine. A small dermatotomy was made. Under real-time sonographic guidance, the vessel was punctured with a 21 gauge micropuncture needle. Using standard technique, the initial micro needle was exchanged over a 0.018 micro wire for a transitional 4 [place] micro sheath. The micro sheath was then exchanged over a 0.035 wire for a 5 French vascular sheath. A C2 cobra catheter was advanced over a Bentson wire into the abdominal aorta. C2 cobra catheter could just engage with the origin of the celiac artery. Celiac arteriography was performed. Anatomy appears conventional. No evidence of active bleeding. The C2 cobra catheter could not seat well in the artery origin secondary to steep angulation of the proximal artery. Therefore, the C2 cobra catheter was exchanged for a Sos Omni selective catheter. The SOS and Glidewire were then used to secure access to the celiac axis. A landed microcatheter was then navigated over a Fathom 16 wire in used to select the distal gastroduodenal artery. Arteriography was performed. No evidence of active extravasation at this time. Given the patient's significant recent bleed and current anticoagulated status, the decision was made to proceed with prophylactic embolization of the gastroduodenal artery. Coil embolization was then performed using a combination of 3 and 4 mm Ruby detachable microcoils. Complete occlusion of the gastroduodenal artery was achieved. The catheters were removed. A limited right common femoral arteriogram demonstrates a high bifurcation. The access is good and within the short segment of common femoral artery and below the inguinal ligament. Hemostasis was attained with the assistance of an Angio-Seal closure device. IMPRESSION: 1.  No active arterial extravasation at this time. 2. Prophylactic coil embolization of the gastroduodenal artery. Signed, Criselda Peaches, MD, RPVI [facility] Radiology PLAN: Continue to monitor H and H and transfuse as needed. Electronically Signed   By: Jacqulynn Cadet M.D.   On: 10/08/2018 19:53    Discharge Exam: Vitals:   10/12/18 0619 10/12/18 0810  BP: 110/70   Pulse: 80   Resp:  13  Temp: 97.8 F (36.6 C)   SpO2: 99%    Vitals:   10/11/18 1434 10/11/18 2055 10/12/18 0619 10/12/18 0810  BP:  125/76 110/70   Pulse:  88 80   Resp:    13  Temp:  98.9 F (37.2 C) 97.8 F (36.6 C)   TempSrc:  Oral Oral   SpO2:  98% 99%   Weight:   54.5 kg   Height: 5\' 1"  (1.549 m)       General: Pt is alert, awake, not in acute distress Cardiovascular: RRR, S1/S2 +, no rubs, no gallops Respiratory: CTA bilaterally, no wheezing, no rhonchi Abdominal: Soft, NT, ND, bowel sounds + Extremities: no edema, no cyanosis   The results of significant diagnostics from this hospitalization (including imaging, microbiology, ancillary and laboratory) are listed below for reference.     Microbiology: Recent Results (from the past 240 hour(s))  SARS Coronavirus 2 (CEPHEID - Performed in [place] hospital lab), Hosp Order     Status: None   Collection Time: 10/07/18  6:00 PM   Specimen: Nasopharyngeal Swab  Result Value Ref Range Status   SARS  Coronavirus 2 NEGATIVE NEGATIVE Final    Comment: (NOTE) If result is NEGATIVE SARS-CoV-2 target nucleic acids are NOT DETECTED. The SARS-CoV-2 RNA is generally detectable in upper and lower  respiratory specimens during the acute phase of infection. The lowest  concentration of SARS-CoV-2 viral copies this assay can detect is 250  copies / mL. A negative result does not preclude SARS-CoV-2 infection  and should not be used as the sole basis for treatment or other  patient management decisions.  A  negative result may occur with  improper specimen collection / handling, submission of specimen other  than nasopharyngeal swab, presence of viral mutation(s) within the  areas targeted by this assay, and inadequate number of viral copies  (<250 copies / mL). A negative result must be combined with clinical  observations, patient history, and epidemiological information. If result is POSITIVE SARS-CoV-2 target nucleic acids are DETECTED. The SARS-CoV-2 RNA is generally detectable in upper and lower  respiratory specimens dur ing the acute phase of infection.  Positive  results are indicative of active infection with SARS-CoV-2.  Clinical  correlation with patient history and other diagnostic information is  necessary to determine patient infection status.  Positive results do  not rule out bacterial infection or co-infection with other viruses. If result is PRESUMPTIVE POSTIVE SARS-CoV-2 nucleic acids MAY BE PRESENT.   A presumptive positive result was obtained on the submitted specimen  and confirmed on repeat testing.  While 2019 novel coronavirus  (SARS-CoV-2) nucleic acids may be present in the submitted sample  additional confirmatory testing may be necessary for epidemiological  and / or clinical management purposes  to differentiate between  SARS-CoV-2 and other Sarbecovirus currently known to infect humans.  If clinically indicated additional testing with an alternate test  methodology [phone]) is advised. The SARS-CoV-2 RNA is generally  detectable in upper and lower respiratory sp ecimens during the acute  phase of infection. The expected result is Negative. Fact Sheet for Patients:  StrictlyIdeas.no Fact Sheet for Healthcare Providers: BankingDealers.co.za This test is not yet approved or cleared by the [place] FDA and has been authorized for detection and/or diagnosis of SARS-CoV-2 by FDA under an Emergency Use  Authorization (EUA).  This EUA will remain in effect (meaning this test can be used) for the duration of the COVID-19 declaration under Section 564(b)(1) of the Act, 21 U.S.C. section 360bbb-3(b)(1), unless the authorization is terminated or revoked sooner. Performed at [facility], [address], [place], [place] [address]   MRSA PCR Screening     Status: None   Collection Time: 10/08/18  6:37 AM   Specimen: Nasal Mucosa; Nasopharyngeal  Result Value Ref Range Status   MRSA by PCR NEGATIVE NEGATIVE Final    Comment:        The GeneXpert MRSA Assay (FDA approved for NASAL specimens only), is one component of a comprehensive MRSA colonization surveillance program. It is not intended to diagnose MRSA infection nor to guide or monitor treatment for MRSA infections. Performed at [facility], [address] [place],  [address]      Labs: BNP (last 3 results) Recent Labs    08/31/18 1451  BNP 412.8*   Basic Metabolic Panel: Recent Labs  Lab 10/07/18 1343 10/07/18 1543 10/08/18 0421 10/09/18 0527  NA 139 141 141 141  K 4.7 4.3 4.0 3.4*  CL 105 107 112* 110  CO2 24 23 21* 20*  GLUCOSE 109* 106* 101* 121*  BUN 31* 33* 38* 39*  CREATININE 0.62 0.88 0.46 0.79  CALCIUM 9.0 9.2 7.9* 7.7*   Liver Function Tests: Recent Labs  Lab 10/07/18 1543 10/09/18 0527  AST 17 13*  ALT 15 12  ALKPHOS 74 40  BILITOT 0.4 0.7  PROT 7.5 4.8*  ALBUMIN 4.0 2.5*   No results for input(s): LIPASE, AMYLASE in the last 168 hours. No results for input(s): AMMONIA in the last 168 hours. CBC: Recent Labs  Lab 10/08/18 1447 10/09/18 0748  10/10/18 0346 10/10/18 1541 10/11/18 0706 10/11/18 1316 10/12/18 0240  WBC 14.7* 10.0  --  7.7  --  7.2  --  6.7  HGB 6.7* 8.7*   < > 6.7* 9.0* 7.9* 9.2* 7.8*  HCT 20.8* 24.9*   < > 20.3* 26.2* 23.9* 27.4* 23.1*  MCV 92.0 86.5  --  90.6  --  90.2  --  90.9  PLT 173 138*  --  122*  --  123*  --  135*   < > = values in this interval not  displayed.   Cardiac Enzymes: No results for input(s): CKTOTAL, CKMB, CKMBINDEX, TROPONINI in the last 168 hours. BNP: Invalid input(s): POCBNP CBG: No results for input(s): GLUCAP in the last 168 hours. D-Dimer No results for input(s): DDIMER in the last 72 hours. Hgb A1c No results for input(s): HGBA1C in the last 72 hours. Lipid Profile No results for input(s): CHOL, HDL, LDLCALC, TRIG, CHOLHDL, LDLDIRECT in the last 72 hours. Thyroid function studies No results for input(s): TSH, T4TOTAL, T3FREE, THYROIDAB in the last 72 hours.  Invalid input(s): FREET3 Anemia work up No results for input(s): VITAMINB12, FOLATE, FERRITIN, TIBC, IRON, RETICCTPCT in the last 72 hours. Urinalysis    Component Value Date/Time   COLORURINE YELLOW 09/04/2018 1400   APPEARANCEUR CLEAR 09/04/2018 1400   LABSPEC 1.014 09/04/2018 1400   PHURINE 6.0 09/04/2018 1400   GLUCOSEU NEGATIVE 09/04/2018 1400   HGBUR SMALL (A) 09/04/2018 1400   BILIRUBINUR NEGATIVE 09/04/2018 1400   KETONESUR NEGATIVE 09/04/2018 1400   PROTEINUR NEGATIVE 09/04/2018 1400   UROBILINOGEN 0.2 11/10/2010 2001   NITRITE NEGATIVE 09/04/2018 1400   LEUKOCYTESUR TRACE (A) 09/04/2018 1400   Sepsis Labs Invalid input(s): PROCALCITONIN,  WBC,  LACTICIDVEN Microbiology Recent Results (from the past 240 hour(s))  SARS Coronavirus 2 (CEPHEID - Performed in [place] hospital lab), Hosp Order     Status: None   Collection Time: 10/07/18  6:00 PM   Specimen: Nasopharyngeal Swab  Result Value Ref Range Status   SARS Coronavirus 2 NEGATIVE NEGATIVE Final    Comment: (NOTE) If result is NEGATIVE SARS-CoV-2 target nucleic acids are NOT DETECTED. The SARS-CoV-2 RNA is generally detectable in upper and lower  respiratory specimens during the acute phase of infection. The lowest  concentration of SARS-CoV-2 viral copies this assay can detect is 250  copies / mL. A negative result does not preclude SARS-CoV-2 infection  and should not  be used as the sole basis for treatment or other  patient management decisions.  A negative result may occur with  improper specimen collection / handling, submission of specimen other  than nasopharyngeal swab, presence of viral mutation(s) within the  areas targeted by this assay, and inadequate number of viral copies  (<250 copies / mL). A negative result must be combined with clinical  observations, patient history, and epidemiological information. If result is POSITIVE SARS-CoV-2 target nucleic acids are DETECTED. The SARS-CoV-2 RNA is generally detectable in upper and lower  respiratory specimens dur ing the acute  phase of infection.  Positive  results are indicative of active infection with SARS-CoV-2.  Clinical  correlation with patient history and other diagnostic information is  necessary to determine patient infection status.  Positive results do  not rule out bacterial infection or co-infection with other viruses. If result is PRESUMPTIVE POSTIVE SARS-CoV-2 nucleic acids MAY BE PRESENT.   A presumptive positive result was obtained on the submitted specimen  and confirmed on repeat testing.  While 2019 novel coronavirus  (SARS-CoV-2) nucleic acids may be present in the submitted sample  additional confirmatory testing may be necessary for epidemiological  and / or clinical management purposes  to differentiate between  SARS-CoV-2 and other Sarbecovirus currently known to infect humans.  If clinically indicated additional testing with an alternate test  methodology [phone]) is advised. The SARS-CoV-2 RNA is generally  detectable in upper and lower respiratory sp ecimens during the acute  phase of infection. The expected result is Negative. Fact Sheet for Patients:  StrictlyIdeas.no Fact Sheet for Healthcare Providers: BankingDealers.co.za This test is not yet approved or cleared by the [place] FDA and has been authorized  for detection and/or diagnosis of SARS-CoV-2 by FDA under an Emergency Use Authorization (EUA).  This EUA will remain in effect (meaning this test can be used) for the duration of the COVID-19 declaration under Section 564(b)(1) of the Act, 21 U.S.C. section 360bbb-3(b)(1), unless the authorization is terminated or revoked sooner. Performed at [facility], [address], [place], [place] [address]   MRSA PCR Screening     Status: None   Collection Time: 10/08/18  6:37 AM   Specimen: Nasal Mucosa; Nasopharyngeal  Result Value Ref Range Status   MRSA by PCR NEGATIVE NEGATIVE Final    Comment:        The GeneXpert MRSA Assay (FDA approved for NASAL specimens only), is one component of a comprehensive MRSA colonization surveillance program. It is not intended to diagnose MRSA infection nor to guide or monitor treatment for MRSA infections. Performed at [facility], [address] [place], [place] [address]      Time coordinating discharge: 35 minutes  SIGNED:   Antonieta Pert, MD  [facility] 10/12/2018, 10:17 AM  If 7PM-7AM, please contact night-coverage [email or web address]   Objective: Vitals:   10/11/18 1434 10/11/18 2055 10/12/18 0619 10/12/18 0810  BP:  125/76 110/70   Pulse:  88 80   Resp:    13  Temp:  98.9 F (37.2 C) 97.8 F (36.6 C)   TempSrc:  Oral Oral   SpO2:  98% 99%   Weight:   54.5 kg   Height: 5\' 1"  (1.549 m)       Intake/Output Summary (Last 24 hours) at 10/12/2018 1017 Last data filed at 10/12/2018 8676 Gross per 24 hour  Intake 853.95 ml  Output 1100 ml  Net -246.05 ml   Filed Weights   10/10/18 0500 10/11/18 0616 10/12/18 0619  Weight: 52.9 kg 55.5 kg 54.5 kg   Weight change: -0.953 kg  Body mass index is 22.71 kg/m.  Intake/Output from previous day: 07/03 0701 - 07/04 0700 In: 854 [P.O.:600; I.V.:254] Out: 1350 [Urine:1350] Intake/Output this shift: No intake/output data recorded.  Examination: General exam: Calm, comfortable,  calm comfortable not in distress.  Thin.   HEENT:Oral mucosa moist, Ear/Nose WNL grossly, dentition normal. Respiratory system: Bilateral equal air entry, no crackles and wheezing, no use of accessory muscle. Cardiovascular system: regular rate and rhythm, S1 & S2 heard, No JVD/murmurs. Gastrointestinal system:  Abdomen soft, non-tender, non-distended, BS +. No hepatosplenomegaly palpable. Nervous System:Alert, awake and oriented at baseline. Able to move UE and LE, sensation intact. Extremities: No edema, distal peripheral pulses palpable.  Skin: No rashes,no icterus. MSK: Normal muscle bulk,tone, power  Medications:  Scheduled Meds: . sodium chloride   Intravenous Once  . sodium chloride   Intravenous Once  . sodium chloride   Intravenous Once  . docusate sodium  100 mg Oral Daily  . pantoprazole  40 mg Oral BID  . sodium chloride flush  3 mL Intravenous Q12H   Continuous Infusions:   Data Reviewed: I have personally reviewed following labs and imaging studies  CBC: Recent Labs  Lab 10/08/18 1447 10/09/18 0748  10/10/18 0346 10/10/18 1541 10/11/18 0706 10/11/18 1316 10/12/18 0240  WBC 14.7* 10.0  --  7.7  --  7.2  --  6.7  HGB 6.7* 8.7*   < > 6.7* 9.0* 7.9* 9.2* 7.8*  HCT 20.8* 24.9*   < > 20.3* 26.2* 23.9* 27.4* 23.1*  MCV 92.0 86.5  --  90.6  --  90.2  --  90.9  PLT 173 138*  --  122*  --  123*  --  135*   < > = values in this interval not displayed.   Basic Metabolic Panel: Recent Labs  Lab 10/07/18 1343 10/07/18 1543 10/08/18 0421 10/09/18 0527  NA 139 141 141 141  K 4.7 4.3 4.0 3.4*  CL 105 107 112* 110  CO2 24 23 21* 20*  GLUCOSE 109* 106* 101* 121*  BUN 31* 33* 38* 39*  CREATININE 0.62 0.88 0.46 0.79  CALCIUM 9.0 9.2 7.9* 7.7*   GFR: Estimated Creatinine Clearance: 45.1 mL/min (by C-G formula based on SCr of 0.79 mg/dL). Liver Function Tests: Recent Labs  Lab 10/07/18 1543 10/09/18 0527  AST 17 13*  ALT 15 12  ALKPHOS 74 40  BILITOT 0.4 0.7   PROT 7.5 4.8*  ALBUMIN 4.0 2.5*   No results for input(s): LIPASE, AMYLASE in the last 168 hours. No results for input(s): AMMONIA in the last 168 hours. Coagulation Profile: Recent Labs  Lab 10/08/18 0421 10/08/18 1447 10/09/18 0527 10/10/18 0346 10/11/18 0706  INR 2.2* 1.7* 1.3* 1.2 1.2   Cardiac Enzymes: No results for input(s): CKTOTAL, CKMB, CKMBINDEX, TROPONINI in the last 168 hours. BNP (last 3 results) No results for input(s): PROBNP in the last 8760 hours. HbA1C: No results for input(s): HGBA1C in the last 72 hours. CBG: No results for input(s): GLUCAP in the last 168 hours. Lipid Profile: No results for input(s): CHOL, HDL, LDLCALC, TRIG, CHOLHDL, LDLDIRECT in the last 72 hours. Thyroid Function Tests: No results for input(s): TSH, T4TOTAL, FREET4, T3FREE, THYROIDAB in the last 72 hours. Anemia Panel: No results for input(s): VITAMINB12, FOLATE, FERRITIN, TIBC, IRON, RETICCTPCT in the last 72 hours. Sepsis Labs: No results for input(s): PROCALCITON, LATICACIDVEN in the last 168 hours.  Recent Results (from the past 240 hour(s))  SARS Coronavirus 2 (CEPHEID - Performed in [place] hospital lab), Hosp Order     Status: None   Collection Time: 10/07/18  6:00 PM   Specimen: Nasopharyngeal Swab  Result Value Ref Range Status   SARS Coronavirus 2 NEGATIVE NEGATIVE Final    Comment: (NOTE) If result is NEGATIVE SARS-CoV-2 target nucleic acids are NOT DETECTED. The SARS-CoV-2 RNA is generally detectable in upper and lower  respiratory specimens during the acute phase of infection. The lowest  concentration of SARS-CoV-2 viral copies this assay  can detect is 250  copies / mL. A negative result does not preclude SARS-CoV-2 infection  and should not be used as the sole basis for treatment or other  patient management decisions.  A negative result may occur with  improper specimen collection / handling, submission of specimen other  than nasopharyngeal swab,  presence of viral mutation(s) within the  areas targeted by this assay, and inadequate number of viral copies  (<250 copies / mL). A negative result must be combined with clinical  observations, patient history, and epidemiological information. If result is POSITIVE SARS-CoV-2 target nucleic acids are DETECTED. The SARS-CoV-2 RNA is generally detectable in upper and lower  respiratory specimens dur ing the acute phase of infection.  Positive  results are indicative of active infection with SARS-CoV-2.  Clinical  correlation with patient history and other diagnostic information is  necessary to determine patient infection status.  Positive results do  not rule out bacterial infection or co-infection with other viruses. If result is PRESUMPTIVE POSTIVE SARS-CoV-2 nucleic acids MAY BE PRESENT.   A presumptive positive result was obtained on the submitted specimen  and confirmed on repeat testing.  While 2019 novel coronavirus  (SARS-CoV-2) nucleic acids may be present in the submitted sample  additional confirmatory testing may be necessary for epidemiological  and / or clinical management purposes  to differentiate between  SARS-CoV-2 and other Sarbecovirus currently known to infect humans.  If clinically indicated additional testing with an alternate test  methodology [phone]) is advised. The SARS-CoV-2 RNA is generally  detectable in upper and lower respiratory sp ecimens during the acute  phase of infection. The expected result is Negative. Fact Sheet for Patients:  StrictlyIdeas.no Fact Sheet for Healthcare Providers: BankingDealers.co.za This test is not yet approved or cleared by the [place] FDA and has been authorized for detection and/or diagnosis of SARS-CoV-2 by FDA under an Emergency Use Authorization (EUA).  This EUA will remain in effect (meaning this test can be used) for the duration of the COVID-19 declaration under  Section 564(b)(1) of the Act, 21 U.S.C. section 360bbb-3(b)(1), unless the authorization is terminated or revoked sooner. Performed at [facility], [address], [place], [place] [address]   MRSA PCR Screening     Status: None   Collection Time: 10/08/18  6:37 AM   Specimen: Nasal Mucosa; Nasopharyngeal  Result Value Ref Range Status   MRSA by PCR NEGATIVE NEGATIVE Final    Comment:        The GeneXpert MRSA Assay (FDA approved for NASAL specimens only), is one component of a comprehensive MRSA colonization surveillance program. It is not intended to diagnose MRSA infection nor to guide or monitor treatment for MRSA infections. Performed at [facility], [address], [place],  [address]       Radiology Studies: No results found.    LOS: 4 days   Time spent: More than 50% of that time was spent in counseling and/or coordination of care.  Antonieta Pert, MD [facility]  10/12/2018, 10:17 AM

## 2018-10-12 NOTE — Progress Notes (Signed)
Called IR on call MD about site care to pt's rt groin. Per MD ok to take dressing off & place Band-Aid. Will take dressing off & apply Band-Aid & monitor pt as needed. Hoover Brunette, RN

## 2018-10-13 ENCOUNTER — Telehealth: Payer: Self-pay | Admitting: Family Medicine

## 2018-10-13 NOTE — Telephone Encounter (Signed)
Nurse's-patient recently discharged from the hospital. Please call patient, let them know that we are aware that they were discharged from the hospital. Please schedule them to follow-up with [place] within the next 7 days. Advised the patient to bring all of their medications with him to the visit. Please inquire if they are having any acute issues currently and documented accordingly.  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  Patient to bring all medications with her.  Check fingerstick hemoglobin.

## 2018-10-14 ENCOUNTER — Other Ambulatory Visit: Payer: Self-pay

## 2018-10-14 ENCOUNTER — Ambulatory Visit (INDEPENDENT_AMBULATORY_CARE_PROVIDER_SITE_OTHER): Payer: Medicare Other | Admitting: Internal Medicine

## 2018-10-14 ENCOUNTER — Other Ambulatory Visit: Payer: Self-pay | Admitting: Physician Assistant

## 2018-10-14 ENCOUNTER — Other Ambulatory Visit (INDEPENDENT_AMBULATORY_CARE_PROVIDER_SITE_OTHER): Payer: Self-pay | Admitting: Internal Medicine

## 2018-10-14 ENCOUNTER — Other Ambulatory Visit: Payer: Self-pay | Admitting: Family Medicine

## 2018-10-14 ENCOUNTER — Encounter (INDEPENDENT_AMBULATORY_CARE_PROVIDER_SITE_OTHER): Payer: Self-pay | Admitting: *Deleted

## 2018-10-14 ENCOUNTER — Other Ambulatory Visit: Payer: Self-pay | Admitting: Cardiovascular Disease

## 2018-10-14 ENCOUNTER — Encounter (HOSPITAL_COMMUNITY): Payer: Self-pay | Admitting: Gastroenterology

## 2018-10-14 VITALS — BP 125/75 | HR 85 | Temp 98.4°F | Ht 61.0 in | Wt 120.9 lb

## 2018-10-14 DIAGNOSIS — K922 Gastrointestinal hemorrhage, unspecified: Secondary | ICD-10-CM

## 2018-10-14 DIAGNOSIS — Z950 Presence of cardiac pacemaker: Secondary | ICD-10-CM | POA: Diagnosis not present

## 2018-10-14 DIAGNOSIS — Z48812 Encounter for surgical aftercare following surgery on the circulatory system: Secondary | ICD-10-CM | POA: Diagnosis not present

## 2018-10-14 DIAGNOSIS — I110 Hypertensive heart disease with heart failure: Secondary | ICD-10-CM | POA: Diagnosis not present

## 2018-10-14 DIAGNOSIS — I480 Paroxysmal atrial fibrillation: Secondary | ICD-10-CM | POA: Diagnosis not present

## 2018-10-14 DIAGNOSIS — D649 Anemia, unspecified: Secondary | ICD-10-CM | POA: Diagnosis not present

## 2018-10-14 DIAGNOSIS — I5033 Acute on chronic diastolic (congestive) heart failure: Secondary | ICD-10-CM | POA: Diagnosis not present

## 2018-10-14 LAB — H PYLORI, IGM, IGG, IGA AB
H Pylori IgG: 1.7 Index Value — ABNORMAL HIGH (ref 0.00–0.79)
H. Pylogi, Iga Abs: 10.8 units — ABNORMAL HIGH (ref 0.0–8.9)
H. Pylogi, Igm Abs: <9 units (ref 0.0–8.9)

## 2018-10-14 NOTE — Progress Notes (Signed)
Subjective:    Patient ID: Hannah Roy, female    DOB: 12-14-1940, 78 y.o.   MRN: [id number]  H. pyrlori pending.   HPI Here today for f/u after 2recent admissions to [facility] for GI bleed. Hx significant for atrial fib, Rheumatic valvular disease, hypertension.  She underwent open heart surgery on  09/05/2018 for AVR with bioprosthetic tissue valve, MVR with bioprosthetic tissue valve repair and annuloplasty, maze procedure, and repair of ascending thoracic aortic aneurysm. She was discharged on Coumadin.  Presented to the ED 10/07/2018 feeling tired and SOB. Had been noticing dark stools though she thought they were due to the iron.  Hemoglobin on 10/07/2018 7.4 compared to 9.8 on 09/12/2018. Stool was positive for  blood in the ED.  She underwent an EGD on 10/08/2018, Dr. Oneida Alar. Impression UGI bleed due to ulcer at junction D1/D2, not amendable to endoscopic therapy. H. Pylori status unknown.  Dr. Oneida Alar discussed case and need for transfer for IR angio/embolization with Dr. Denton Brick. On 10/08/2018 underwent angiogram and coil embolization of gastroduodenal artery.  On 7.05/2018 she underwent and upper endoscopy (follow up of duodenal ulcer with hemorrhage). Dr. Estill Cotta. [place].  Impression: Normal esophagus. Erosive gastropathy. Oozing duodenal ulcer. Treated with argon plasma coagulation (APC).  If coumadin is felt to be necessary due to recent cardiac surgery, patient will need outpatient repeat EGD She thinks she received 4 units of PRBCs.   Her coumadin is on hold since 10/07/2018. She tells me she is doing good. Her BMs are greenish in color. She has not seen a black stool since discharge. She feels okay. No dizziness. She does feel weak.  H. Pylori is pending.      Last colonoscopy in 2018 with two 5-6 mm polyps in the distal sigmoid colon and in the cecum. External hemorrhoids. Otherwise normal.   CBC    Component Value Date/Time   WBC 6.7 10/12/2018 0240   RBC 2.54 (L) 10/12/2018 0240    HGB 7.8 (L) 10/12/2018 0240   HGB 12.4 01/24/2017 1152   HCT 23.1 (L) 10/12/2018 0240   HCT 38.4 01/24/2017 1152   PLT 135 (L) 10/12/2018 0240   PLT 231 01/24/2017 1152   MCV 90.9 10/12/2018 0240   MCV 95 01/24/2017 1152   MCH 30.7 10/12/2018 0240   MCHC 33.8 10/12/2018 0240   RDW 16.4 (H) 10/12/2018 0240   RDW 13.1 01/24/2017 1152   LYMPHSABS 2.1 08/09/2018 1030   LYMPHSABS 1.9 01/24/2017 1152   MONOABS 0.6 08/09/2018 1030   EOSABS 0.2 08/09/2018 1030   EOSABS 0.2 01/24/2017 1152   BASOSABS 0.0 08/09/2018 1030   BASOSABS 0.0 01/24/2017 1152      Review of Systems Past Medical History:  Diagnosis Date  . Aortic atherosclerosis ([place])   . Aortic insufficiency, rheumatic   . 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 Atrial fibrillation ([place]) 05/10/2018  . Colon polyps   . Essential hypertension, benign   . History of seasonal allergies   . Hypertension   . Hypothyroidism   . Mitral stenosis with regurgitation, rheumatic   . Osteopenia 2006  . Persistent atrial fibrillation   . 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   21 mm [facility] Ease stented bovine pericardial tissue valve  . S/P ascending aortic aneurysm repair 09/05/2018   28 mm Hemashield platinum supracoronary straight graft  . S/P Maze operation for atrial fibrillation 09/05/2018   Complete bilateral atrial lesion set using bipolar radiofrequency and  cryothermy ablation with clipping of LA appendage  . 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   29 mm [facility] Mitral stented bovine pericardial tissue valve  . 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 Tricuspid regurgitation     Past Surgical History:  Procedure Laterality Date  . AORTIC VALVE REPLACEMENT N/A 09/05/2018   Procedure: AORTIC VALVE REPLACEMENT (AVR) USING MAGNA EASE 21MM AOTIC BIOPROSTHESIS VALVE;  Surgeon: Rexene Alberts, MD;  Location: [place];  Service:  Open Heart Surgery;  Laterality: N/A;  . APPENDECTOMY    . CLIPPING OF ATRIAL APPENDAGE  09/05/2018   Procedure: Clipping Of Atrial Appendage;  Surgeon: Rexene Alberts, MD;  Location: [place];  Service: Open Heart Surgery;;  . COLONOSCOPY  03/23/2011   repeat in 5 years,Procedure: COLONOSCOPY;  Surgeon: Rogene Houston, MD;  [facility];  Service: Endoscopy;  Laterality: N/A;  1:00  . COLONOSCOPY N/A 11/09/2016   Procedure: COLONOSCOPY;  Surgeon: Rogene Houston, MD;  [facility];  Service: Endoscopy;  Laterality: N/A;  1030  . ESOPHAGOGASTRODUODENOSCOPY N/A 10/10/2018   Procedure: ESOPHAGOGASTRODUODENOSCOPY (EGD);  Surgeon: Doran Stabler, MD;  Location: [place];  Service: Gastroenterology;  Laterality: N/A;  . ESOPHAGOGASTRODUODENOSCOPY (EGD) WITH PROPOFOL N/A 10/08/2018   Procedure: ESOPHAGOGASTRODUODENOSCOPY (EGD) WITH PROPOFOL;  Surgeon: Danie Binder, MD;  [facility];  Service: Endoscopy;  Laterality: N/A;  . HOT HEMOSTASIS N/A 10/10/2018   Procedure: HOT HEMOSTASIS (ARGON PLASMA COAGULATION/BICAP);  Surgeon: Doran Stabler, MD;  Location: [place];  Service: Gastroenterology;  Laterality: N/A;  . IR ANGIOGRAM SELECTIVE EACH ADDITIONAL VESSEL  10/08/2018  . IR ANGIOGRAM VISCERAL SELECTIVE  10/08/2018  . IR EMBO ART  VEN HEMORR LYMPH EXTRAV  INC GUIDE ROADMAPPING  10/08/2018  . IR US GUIDE VASC ACCESS RIGHT  10/08/2018  . MAZE N/A 09/05/2018   Procedure: MAZE;  Surgeon: Rexene Alberts, MD;  Location: [place];  Service: Open Heart Surgery;  Laterality: N/A;  . MITRAL VALVE REPLACEMENT N/A 09/05/2018   Procedure: MITRAL VALVE (MV) REPLACEMENT USING MAGNA MITRAL EASE 29MM BIOPROSTHESIS VALVE;  Surgeon: Rexene Alberts, MD;  Location: [place];  Service: Open Heart Surgery;  Laterality: N/A;  . REPLACEMENT ASCENDING AORTA N/A 09/05/2018   Procedure: SUPRACORONARY STRAIGHT GRAFT REPLACEMENT OF ASCENDING AORTA;  Surgeon: Rexene Alberts, MD;  Location:  [place];  Service: Open Heart Surgery;  Laterality: N/A;  . RIGHT/LEFT HEART CATH AND CORONARY ANGIOGRAPHY N/A 08/29/2018   Procedure: RIGHT/LEFT HEART CATH AND CORONARY ANGIOGRAPHY;  Surgeon: Lorretta Harp, MD;  Location: [place] [facility];  Service: Cardiovascular;  Laterality: N/A;  . TEE WITHOUT CARDIOVERSION N/A 08/29/2018   Procedure: TRANSESOPHAGEAL ECHOCARDIOGRAM (TEE);  Surgeon: Skeet Latch, MD;  Location: [place];  Service: Cardiovascular;  Laterality: N/A;  . TRICUSPID VALVE REPLACEMENT N/A 09/05/2018   Procedure: TRICUSPID VALVE REPAIR USING EDWARDS MC3 TRICUSPID ANNULOPLASTY RING SIZE T28;  Surgeon: Rexene Alberts, MD;  Location: [place];  Service: Open Heart Surgery;  Laterality: N/A;  . TUBAL LIGATION      Allergies  Allergen Reactions  . Bee Venom Anaphylaxis  . Boniva [Ibandronic Acid] Nausea And Vomiting and Other (See Comments)    Upset stomach, flu like symptoms   . Lisinopril Cough  . Latex Rash    Current Outpatient Medications on File Prior to Visit  Medication Sig Dispense Refill  . bisacodyl (DULCOLAX)  5 MG EC tablet Take 5 mg by mouth daily.    . ferrous [id number] C-folic acid (TRINSICON / FOLTRIN) capsule Take 1 capsule by mouth daily with breakfast. 30 capsule 0  . ondansetron (ZOFRAN) 4 MG tablet Take 1 tablet (4 mg total) by mouth every 8 (eight) hours as needed for nausea or vomiting. 30 tablet 0  . pantoprazole (PROTONIX) 40 MG tablet Take 1 tablet (40 mg total) by mouth 2 (two) times daily. 60 tablet 0  . traMADol (ULTRAM) 50 MG tablet Take 1 tablet (50 mg total) by mouth every 4 (four) hours as needed for moderate pain. 20 tablet 0   No current facility-administered medications on file prior to visit.         Objective:   Physical Exam Blood pressure 125/75, pulse 85, temperature 98.4 F (36.9 C), height 5\' 1"  (1.549 m), weight 120 lb 14.4 oz (54.8 kg). Alert and oriented. Skin warm and dry. Oral mucosa is moist.   . Sclera  anicteric, conjunctivae is pink. Thyroid not enlarged. No cervical lymphadenopathy. Lungs clear. Heart regular rate and rhythm.  Abdomen is soft. Bowel sounds are positive. No hepatomegaly. No abdominal masses felt. No tenderness.  No edema to lower extremities.          Assessment & Plan:  UGI Bleed.  Repeat EGD in 4 weeks to document healing of ulcer.  I discussed with Dr.Rehman.

## 2018-10-14 NOTE — Telephone Encounter (Signed)
Please advise. Thank you

## 2018-10-14 NOTE — Telephone Encounter (Signed)
Called pt to see if she needed anything and to let her know that dr scott wants her to follow up and [name] answered her phone and states that pt is currently unable to talk and all her calls go to her for now. Daughter is not on dpr. She did states she was doing ok and did not need anything currently at this time. Wanted to make a hospital follow up for her mother but no one was available at the front. I told her I would send a message to the front to give her a call tomorrow to schedule. Dr Nicki Reaper would prefer it on Thursday if pt unable to do Thursday then Wednesday if fine.

## 2018-10-15 ENCOUNTER — Other Ambulatory Visit: Payer: Self-pay

## 2018-10-15 LAB — HEMOGLOBIN AND HEMATOCRIT, BLOOD
HCT: 28.1 % — ABNORMAL LOW (ref 35.0–45.0)
Hemoglobin: 9.2 g/dL — ABNORMAL LOW (ref 11.7–15.5)

## 2018-10-15 NOTE — Telephone Encounter (Signed)
[facility] - need to schedule "in office" visit with Dr. Nicki Reaper this week, see note below

## 2018-10-15 NOTE — Telephone Encounter (Signed)
Patient has appointment this week

## 2018-10-16 ENCOUNTER — Telehealth (INDEPENDENT_AMBULATORY_CARE_PROVIDER_SITE_OTHER): Payer: Self-pay | Admitting: Internal Medicine

## 2018-10-16 ENCOUNTER — Ambulatory Visit (INDEPENDENT_AMBULATORY_CARE_PROVIDER_SITE_OTHER): Payer: Medicare Other | Admitting: Family Medicine

## 2018-10-16 ENCOUNTER — Encounter: Payer: Self-pay | Admitting: Family Medicine

## 2018-10-16 VITALS — BP 130/78 | Temp 97.8°F | Wt 120.2 lb

## 2018-10-16 DIAGNOSIS — I099 Rheumatic heart disease, unspecified: Secondary | ICD-10-CM | POA: Diagnosis not present

## 2018-10-16 DIAGNOSIS — E778 Other disorders of glycoprotein metabolism: Secondary | ICD-10-CM | POA: Diagnosis not present

## 2018-10-16 DIAGNOSIS — I10 Essential (primary) hypertension: Secondary | ICD-10-CM

## 2018-10-16 DIAGNOSIS — R490 Dysphonia: Secondary | ICD-10-CM | POA: Diagnosis not present

## 2018-10-16 DIAGNOSIS — I110 Hypertensive heart disease with heart failure: Secondary | ICD-10-CM | POA: Diagnosis not present

## 2018-10-16 DIAGNOSIS — Z950 Presence of cardiac pacemaker: Secondary | ICD-10-CM | POA: Diagnosis not present

## 2018-10-16 DIAGNOSIS — Z48812 Encounter for surgical aftercare following surgery on the circulatory system: Secondary | ICD-10-CM | POA: Diagnosis not present

## 2018-10-16 DIAGNOSIS — D649 Anemia, unspecified: Secondary | ICD-10-CM | POA: Diagnosis not present

## 2018-10-16 DIAGNOSIS — I5033 Acute on chronic diastolic (congestive) heart failure: Secondary | ICD-10-CM | POA: Diagnosis not present

## 2018-10-16 DIAGNOSIS — D508 Other iron deficiency anemias: Secondary | ICD-10-CM | POA: Diagnosis not present

## 2018-10-16 DIAGNOSIS — I480 Paroxysmal atrial fibrillation: Secondary | ICD-10-CM | POA: Diagnosis not present

## 2018-10-16 MED ORDER — ALPRAZOLAM 0.25 MG PO TABS: ORAL_TABLET | ORAL | 0 refills | Status: DC

## 2018-10-16 NOTE — Telephone Encounter (Signed)
We should proceed with treatment and we can delay EGD for another week or so I am afraid ulcer may not heal unless HP eradicated.

## 2018-10-16 NOTE — Progress Notes (Signed)
Subjective:    Patient ID: Hannah Roy, female    DOB: 04-30-1940, 78 y.o.   MRN: [id number]  HPI Pt here today for hospital follow up. Pt was sent to [facility] hospital due to bleeding. Pt had heart surgery back in June. Pt states she is weak today.  This patient has a recently replaced aortic valve was in the hospital for a significant length of time for this Then she had a GI bleed from a duodenal ulcer and required hospitalization along with several procedures to get this under control  She presents today for follow-up her voice is weak she finds her self feeling tired and rundown her energy level is low her appetite is fair she is trying to eat especially can she does take her medicine she is no longer on a blood thinner until her duodenal ulcer heals she denies any signs of current bleeding recent hemoglobin show some improvement  But her recent lab work also showed low protein as well as mild creatinine issues  Review of Systems  Constitutional: Positive for fatigue. Negative for activity change and appetite change.  HENT: Negative for congestion and rhinorrhea.   Respiratory: Negative for cough and shortness of breath.   Cardiovascular: Negative for chest pain and leg swelling.  Gastrointestinal: Negative for abdominal pain and diarrhea.  Endocrine: Negative for polydipsia and polyphagia.  Skin: Negative for color change.  Neurological: Negative for dizziness and weakness.  Psychiatric/Behavioral: Negative for behavioral problems and confusion.       Objective:   Physical Exam Vitals signs reviewed.  Constitutional:      General: She is not in acute distress. HENT:     Head: Normocephalic and atraumatic.  Eyes:     General:        Right eye: No discharge.        Left eye: No discharge.  Neck:     Trachea: No tracheal deviation.  Cardiovascular:     Rate and Rhythm: Normal rate and regular rhythm.     Heart sounds: Normal heart sounds. No murmur.  Pulmonary:     Effort:  Pulmonary effort is normal. No respiratory distress.     Breath sounds: Normal breath sounds.  Lymphadenopathy:     Cervical: No cervical adenopathy.  Skin:    General: Skin is warm and dry.  Neurological:     Mental Status: She is alert.     Coordination: Coordination normal.  Psychiatric:        Behavior: Behavior normal.           Assessment & Plan:  1. Other iron deficiency anemias Continue iron supplementation check lab work await results - CBC with Differential - Basic Metabolic Panel (BMET) - Hepatic function panel - Ferritin  2. Hypocalcemia Calcium was low on previous lab work we will see if this is improved - CBC with Differential - Basic Metabolic Panel (BMET) - Hepatic function panel - Ferritin  3. Hypoproteinemia (HCC) Protein is low related to the significant health issues she has she was encouraged dietary intake and we will follow this - CBC with Differential - Basic Metabolic Panel (BMET) - Hepatic function panel - Ferritin  4. Essential hypertension, benign Blood pressure good control no new measures necessary  5. Rheumatic heart disease Her cardiac repair seems to have done very good for her and will take some time for her to get her energy back she follows up with surgeon next week  6. Hoarseness of voice This will  gradually get better hopefully  Some insomnia issues gets nervous at night I would recommend half of the Xanax low-dose at nighttime if it causes too much drowsiness stop the medication not intended for long-term use  Transitional care review of medications labs was completed new labs ordered.  Patient will be follow-up in August

## 2018-10-16 NOTE — Telephone Encounter (Signed)
H. Pylori is positive. Scheduled for EGD 11/08/2018. I am hold tx till after u do EGD

## 2018-10-17 ENCOUNTER — Telehealth (INDEPENDENT_AMBULATORY_CARE_PROVIDER_SITE_OTHER): Payer: Self-pay | Admitting: Internal Medicine

## 2018-10-17 ENCOUNTER — Other Ambulatory Visit (INDEPENDENT_AMBULATORY_CARE_PROVIDER_SITE_OTHER): Payer: Self-pay | Admitting: Internal Medicine

## 2018-10-17 DIAGNOSIS — A048 Other specified bacterial intestinal infections: Secondary | ICD-10-CM

## 2018-10-17 DIAGNOSIS — E778 Other disorders of glycoprotein metabolism: Secondary | ICD-10-CM | POA: Diagnosis not present

## 2018-10-17 DIAGNOSIS — D508 Other iron deficiency anemias: Secondary | ICD-10-CM | POA: Diagnosis not present

## 2018-10-17 MED ORDER — PYLERA 140-125-125 MG PO CAPS: 3.0000 | ORAL_CAPSULE | Freq: Three times a day (TID) | ORAL | 0 refills | Status: DC

## 2018-10-17 NOTE — Telephone Encounter (Signed)
Ok

## 2018-10-17 NOTE — Telephone Encounter (Signed)
Post pone EGD at least a week from where she is now. I have talked with daughter

## 2018-10-18 ENCOUNTER — Telehealth: Payer: Self-pay | Admitting: Family Medicine

## 2018-10-18 DIAGNOSIS — I110 Hypertensive heart disease with heart failure: Secondary | ICD-10-CM | POA: Diagnosis not present

## 2018-10-18 DIAGNOSIS — I5033 Acute on chronic diastolic (congestive) heart failure: Secondary | ICD-10-CM | POA: Diagnosis not present

## 2018-10-18 DIAGNOSIS — Z950 Presence of cardiac pacemaker: Secondary | ICD-10-CM | POA: Diagnosis not present

## 2018-10-18 DIAGNOSIS — Z48812 Encounter for surgical aftercare following surgery on the circulatory system: Secondary | ICD-10-CM | POA: Diagnosis not present

## 2018-10-18 DIAGNOSIS — D649 Anemia, unspecified: Secondary | ICD-10-CM | POA: Diagnosis not present

## 2018-10-18 DIAGNOSIS — I480 Paroxysmal atrial fibrillation: Secondary | ICD-10-CM | POA: Diagnosis not present

## 2018-10-18 LAB — CBC WITH DIFFERENTIAL/PLATELET
Basophils Absolute: 0.1 10*3/uL (ref 0.0–0.2)
Basos: 1 %
EOS (ABSOLUTE): 0.4 10*3/uL (ref 0.0–0.4)
Eos: 7 %
Hematocrit: 28.5 % — ABNORMAL LOW (ref 34.0–46.6)
Hemoglobin: 9.4 g/dL — ABNORMAL LOW (ref 11.1–15.9)
Immature Grans (Abs): 0 10*3/uL (ref 0.0–0.1)
Immature Granulocytes: 0 %
Lymphocytes Absolute: 0.9 10*3/uL (ref 0.7–3.1)
Lymphs: 18 %
MCH: 29.5 pg (ref 26.6–33.0)
MCHC: 33 g/dL (ref 31.5–35.7)
MCV: 89 fL (ref 79–97)
Monocytes Absolute: 0.5 10*3/uL (ref 0.1–0.9)
Monocytes: 9 %
Neutrophils Absolute: 3.4 10*3/uL (ref 1.4–7.0)
Neutrophils: 65 %
Platelets: 321 10*3/uL (ref 150–450)
RBC: 3.19 x10E6/uL — ABNORMAL LOW (ref 3.77–5.28)
RDW: 14.6 % (ref 11.7–15.4)
WBC: 5.2 10*3/uL (ref 3.4–10.8)

## 2018-10-18 LAB — BASIC METABOLIC PANEL
BUN/Creatinine Ratio: 13 (ref 12–28)
BUN: 8 mg/dL (ref 8–27)
CO2: 24 mmol/L (ref 20–29)
Calcium: 8.8 mg/dL (ref 8.7–10.3)
Chloride: 103 mmol/L (ref 96–106)
Creatinine, Ser: 0.6 mg/dL (ref 0.57–1.00)
GFR calc Af Amer: 102 mL/min/{1.73_m2} (ref >59)
GFR calc non Af Amer: 89 mL/min/{1.73_m2} (ref >59)
Glucose: 91 mg/dL (ref 65–99)
Potassium: 4.2 mmol/L (ref 3.5–5.2)
Sodium: 141 mmol/L (ref 134–144)

## 2018-10-18 LAB — HEPATIC FUNCTION PANEL
ALT: 10 IU/L (ref 0–32)
AST: 12 IU/L (ref 0–40)
Albumin: 3.6 g/dL — ABNORMAL LOW (ref 3.7–4.7)
Alkaline Phosphatase: 73 IU/L (ref 39–117)
Bilirubin Total: 0.5 mg/dL (ref 0.0–1.2)
Bilirubin, Direct: 0.15 mg/dL (ref 0.00–0.40)
Total Protein: 6.3 g/dL (ref 6.0–8.5)

## 2018-10-18 LAB — FERRITIN: Ferritin: 287 ng/mL — ABNORMAL HIGH (ref 15–150)

## 2018-10-18 MED ORDER — FLUCONAZOLE 150 MG PO TABS: ORAL_TABLET | ORAL | 0 refills | Status: DC

## 2018-10-18 NOTE — Telephone Encounter (Signed)
Patient is requesting something for yeast infection to be called into [facility].

## 2018-10-18 NOTE — Telephone Encounter (Signed)
Please advise. Thank you

## 2018-10-18 NOTE — Telephone Encounter (Signed)
Contacted patient. Pt states she is on 2 different ABT from GI doctor and she usually gets a yeast infection with ABTs. Sent in [place] to [facility]. Pt verbalized understanding.

## 2018-10-18 NOTE — Telephone Encounter (Signed)
Hannah Roy with [facility] calling requesting verbal for speech therapy for vocal hygiene and swallowing issues due to intubation.   She would like to see her 1 x every other week for 4 weeks.   CB# [phone]

## 2018-10-19 ENCOUNTER — Encounter: Payer: Self-pay | Admitting: Family Medicine

## 2018-10-19 NOTE — Telephone Encounter (Signed)
Please go ahead and give approval thank you

## 2018-10-21 ENCOUNTER — Other Ambulatory Visit: Payer: Self-pay | Admitting: *Deleted

## 2018-10-21 ENCOUNTER — Other Ambulatory Visit: Payer: Self-pay

## 2018-10-21 ENCOUNTER — Ambulatory Visit
Admission: RE | Admit: 2018-10-21 | Discharge: 2018-10-21 | Disposition: A | Discharge status: Home / Self Care | Payer: Medicare Other | Source: Ambulatory Visit | Attending: Thoracic Surgery (Cardiothoracic Vascular Surgery) | Admitting: Thoracic Surgery (Cardiothoracic Vascular Surgery)

## 2018-10-21 ENCOUNTER — Ambulatory Visit (INDEPENDENT_AMBULATORY_CARE_PROVIDER_SITE_OTHER): Payer: Self-pay | Admitting: Thoracic Surgery (Cardiothoracic Vascular Surgery)

## 2018-10-21 ENCOUNTER — Ambulatory Visit: Payer: Medicare Other | Admitting: Thoracic Surgery (Cardiothoracic Vascular Surgery)

## 2018-10-21 VITALS — BP 148/89 | HR 86 | Temp 97.7°F | Resp 20 | Ht 61.0 in | Wt 116.0 lb

## 2018-10-21 DIAGNOSIS — I517 Cardiomegaly: Secondary | ICD-10-CM | POA: Diagnosis not present

## 2018-10-21 DIAGNOSIS — Z9889 Other specified postprocedural states: Secondary | ICD-10-CM

## 2018-10-21 DIAGNOSIS — Z953 Presence of xenogenic heart valve: Secondary | ICD-10-CM

## 2018-10-21 DIAGNOSIS — Z8679 Personal history of other diseases of the circulatory system: Secondary | ICD-10-CM

## 2018-10-21 NOTE — Patient Instructions (Signed)
Discuss with your gastroenterologist whether or not you may resume taking warfarin (Coumadin) at this time.  I would recommend starting as soon as it is felt to be safe.  Continue all other previous medications without any changes at this time  Continue to avoid any heavy lifting or strenuous use of your arms or shoulders for at least a total of three months from the time of surgery.  After three months you may gradually increase how much you lift or otherwise use your arms or chest as tolerated, with limits based upon whether or not activities lead to the return of significant discomfort.  Endocarditis is a potentially serious infection of heart valves or inside lining of the heart.  It occurs more commonly in patients with diseased heart valves (such as patient's with aortic or mitral valve disease) and in patients who have undergone heart valve repair or replacement.  Certain surgical and dental procedures may put you at risk, such as dental cleaning, other dental procedures, or any surgery involving the respiratory, urinary, gastrointestinal tract, gallbladder or prostate gland.   To minimize your chances for develooping endocarditis, maintain good oral health and seek prompt medical attention for any infections involving the mouth, teeth, gums, skin or urinary tract.    Always notify your doctor or dentist about your underlying heart valve condition before having any invasive procedures. You will need to take antibiotics before certain procedures, including all routine dental cleanings or other dental procedures.  Your cardiologist or dentist should prescribe these antibiotics for you to be taken ahead of time.

## 2018-10-21 NOTE — Telephone Encounter (Signed)
Verbal order given to Bethena Roys at [facility]

## 2018-10-21 NOTE — Progress Notes (Signed)
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  The patient's early postoperative recovery in the hospital was notable for transient complete heart block which resolved.  She was discharged from the hospital in sinus rhythm on the eighth postoperative day on warfarin anticoagulation.  She was last seen here in our office on September 23, 2018 at which time she was doing very well with exception of the complaint of persistent hoarseness of voice and difficulty swallowing, presumably related to laryngeal trauma related to endotracheal intubation.  2 weeks later she was hospitalized at [facility] with symptomatic anemia and melena.  She underwent upper GI endoscopy and was found to have active duodenal ulcer that was cauterized.  She apparently was biopsy positive for H. pylori.  Coumadin was stopped at that time and she has been on oral antibiotics and Protonix ever since.  Follow-up repeat upper GI endoscopy has been postponed.  The patient's hemoglobin has remained stable and reported 9.4 when it was checked last week.  She returns to our office today with her daughter present.  She reports that she feels very well and is getting stronger every day.  She still has hoarseness of voice but it is starting to improve.  She has recently  started working with Electrical engineer.  Swallowing has improved as well and her appetite is better.  She does not have any shortness of breath and she reports that her breathing is much better than it was prior to surgery.  She no longer has any pain at all in her chest.  Overall she is pleased with her progress.  She denies any palpitations or dizzy spells.  She has not had lower extremity edema.   Current Outpatient Medications  Medication Sig Dispense Refill   ALPRAZolam (XANAX) 0.25 MG tablet 1/2 to 1 qhs prn insomnia 20 tablet 0   bisacodyl (DULCOLAX) 5 MG EC tablet Take 5 mg by mouth daily.     bismuth-metronidazole-tetracycline (PYLERA) 140-125-125 MG capsule Take 3 capsules by mouth 4 (four) times daily -  before meals and at bedtime. 120 capsule 0   FEROCON capsule TAKE ONE CAPSULE BY MOUTH DAILY WITH BREAKFAST 30 capsule 0   fluconazole (DIFLUCAN) 150 MG tablet Take one tablet by mouth 3 days apart 2 tablet 0   ondansetron (ZOFRAN) 4 MG tablet Take 1 tablet (4 mg total) by mouth every 8 (eight) hours as needed for nausea or vomiting. 30 tablet 0   pantoprazole (PROTONIX) 40 MG tablet Take 1 tablet (40 mg total) by mouth 2 (two) times daily. 60 tablet 0   traMADol (ULTRAM) 50 MG tablet Take 1 tablet (50 mg total) by mouth every 4 (four) hours as needed for moderate pain. 20 tablet 0   No current facility-administered medications for this visit.  Physical Exam:   BP (!) 148/89    Pulse 86    Temp 97.7 F (36.5 C) (Skin)    Resp 20    Ht 5\' 1"  (1.549 m)    Wt 116 lb (52.6 kg)    SpO2 97% Comment: RA   BMI 21.92 kg/m   General:  Well-appearing  Chest:   Clear to auscultation with symmetrical breath sounds  CV:   Regular rate and rhythm without murmur  Incisions:  Healing nicely, sternum is stable  Abdomen:  Soft nontender  Extremities:  Warm and well-perfused, no edema  Diagnostic Tests:  2 channel telemetry rhythm strip demonstrates regular, narrow complex rhythm  that is probably sinus rhythm.  Recent twelve-lead EKG performed October 09, 2018 revealed sinus rhythm with right bundle branch block.   CHEST - 2 VIEW  COMPARISON:  09/08/2018 and older exams.  FINDINGS: There are stable changes from previous cardiac surgery with mitral, aortic and tricuspid valve replacements. Cardiac silhouette is mildly enlarged. No mediastinal or hilar masses. No evidence of adenopathy.  Lungs are hyperexpanded, but clear.  No pleural effusion or pneumothorax.  Skeletal structures are intact.  IMPRESSION: 1. No acute cardiopulmonary disease. 2. Stable changes from the previous cardiac surgery. Mild cardiomegaly.   Electronically Signed   By: Lajean Manes M.D.   On: 10/21/2018 11:00    ECHOCARDIOGRAM REPORT       Patient Name:   Hannah Roy Date of Exam: 10/08/2018 Medical Rec #:  [id number]   Height:       61.0 in Accession #:    [id number]  Weight:       117.0 lb Date of Birth:  [birth year]   BSA:          1.50 m Patient Age:    50 years    BP:           88/64 mmHg Patient Gender: F           HR:           92 bpm. Exam Location:  [facility]    Procedure: 2D Echo, Cardiac Doppler and Color Doppler  Indications:    Dyspnea   History:        Patient has prior history of Echocardiogram examinations, most                 recent 09/05/2018. Atrial Fibrillation Mitral Valve Disease,                 Aortic Valve Disease and TVR, MVR, AVR Risk Factors:                 Hypertension. Aortic Valve: A 18mm Edwards Magna Ease bovine                 aortic valve bioprosthesis Tricuspid Valve: the tricuspid valve                 is status post repair with an annuloplasty ring. valve is                 present in the tricuspid position. Mitral Valve: A 29 mm [facility] Mitral bioprosthetic valve is present in the mitral                 position. Tricuspid Valve: the tricuspid valve is status post  repair with an  annuloplasty ring. valve is present in the                 tricuspid position. MAZE procedure.   Sonographer:    Dustin Flock RDCS Referring Phys: [id number] [place]    1. The left ventricle has normal systolic function with an ejection fraction of 60-65%. The cavity size was normal. There is moderately increased left ventricular wall thickness. Left ventricular diastolic Doppler parameters are consistent with  restrictive filling. No evidence of left ventricular regional wall motion abnormalities.  2. Left atrial size was mildly dilated.  3. A 29 mm [facility] Mitral bovine bioprosthetic valve is present in the mitral position. No significant mitral regurgitation. Valve function grossly normal with normal mean gradient.  4. A 26mm Edwards Magna Ease bovine bioprosthesis is present in the aortic position. No perivalvular leak noted. Sinuses of Valsalva seen with normal aortic root size. LVOT is small. valve function is grossly normal with normal mean gradient. No aortic  regurgitation.  5. The right ventricle has normal systolic function. The cavity was normal. There is no increase in right ventricular wall thickness. Right ventricular systolic pressure is normal with an estimated pressure of 21.7 mmHg.  6. The aortic root is normal in size and structure.  7. 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 Status post tricuspid valve repair. There is mild tricuspid regurgitation.  9. Moderate pericardial effusion. 10. The pericardial effusion is circumferential.  FINDINGS  Left Ventricle: The left ventricle has normal systolic function, with an ejection fraction of 60-65%. The cavity size was normal. There is moderately increased left ventricular wall thickness. Left ventricular diastolic Doppler parameters are consistent  with restrictive filling. No evidence of left ventricular regional wall motion abnormalities..  Right Ventricle: The right ventricle has normal  systolic function. The cavity was normal. There is no increase in right ventricular wall thickness. Right ventricular systolic pressure is normal with an estimated pressure of 28.7 mmHg.  Left Atrium: Left atrial size was mildly dilated.  Right Atrium: Right atrial size was normal in size. Right atrial pressure is estimated at 10 mmHg.  Interatrial Septum: No atrial level shunt detected by color flow Doppler.  Pericardium: A moderately sized pericardial effusion is present. The pericardial effusion is circumferential.  Mitral Valve: The mitral valve has been repaired/replaced. Mitral valve regurgitation is not visualized by color flow Doppler. A 29 mm [facility] Mitral bioprosthetic valve is present in the mitral position.  Tricuspid Valve: The tricuspid valve is rheumatic. Tricuspid valve regurgitation is mild by color flow Doppler. The tricuspid valve is status post repair with an annuloplasty ring. valve is present in the tricuspid position.  Aortic Valve: The aortic valve has been repaired/replaced Aortic valve regurgitation was not visualized by color flow Doppler. A 65mm Edwards Magna Ease bovine aortic valve bioprosthesis valve is present in the aortic position.  Pulmonic Valve: The pulmonic valve was not well visualized. Pulmonic valve regurgitation is trivial by color flow Doppler.  Aorta: The aortic root is normal in size and structure.  Venous: The inferior vena cava is normal in size with greater than 50% respiratory variability.    +--------------+--------++  LEFT VENTRICLE            +----------------+---------++ +--------------+--------++  Diastology                    PLAX 2D                   +----------------+---------++ +--------------+--------++  LV e' lateral:   4.68 cm/s    LVIDd:         3.88 cm    +----------------+---------++ +--------------+--------++  LV E/e' lateral: 24.6         LVIDs:         3.12 cm     +----------------+---------++ +--------------+--------++  LV e' medial:    5.55 cm/s    LV PW:         1.60 cm    +----------------+---------++ +--------------+--------++  LV E/e' medial:  20.7         LV IVS:        1.41 cm    +----------------+---------++ +--------------+--------++  LVOT diam:     2.10 cm    +--------------+--------++  LV SV:         27 ml      +--------------+--------++  LV SV Index:   17.54      +--------------+--------++  LVOT Area:     3.46 cm   +--------------+--------++                            +--------------+--------++  +---------------+---------++  RIGHT VENTRICLE             +---------------+---------++  RV Basal diam:  2.32 cm     +---------------+---------++  RV S prime:     5.55 cm/s   +---------------+---------++  TAPSE (M-mode): 1.7 cm      +---------------+---------++  RVSP:           21.7 mmHg   +---------------+---------++  +---------------+-------++-----------++  LEFT ATRIUM              Index         +---------------+-------++-----------++  LA diam:        3.70 cm  2.46 cm/m    +---------------+-------++-----------++  LA Vol (A2C):   44.0 ml  29.26 ml/m   +---------------+-------++-----------++  LA Vol (A4C):   60.7 ml  40.36 ml/m   +---------------+-------++-----------++  LA Biplane Vol: 56.4 ml  37.50 ml/m   +---------------+-------++-----------++ +------------+---------++-----------++  RIGHT ATRIUM            Index         +------------+---------++-----------++  RA Pressure: 3.00 mmHg                +------------+---------++-----------++  RA Area:     14.10 cm                +------------+---------++-----------++  RA Volume:   36.40 ml   24.20 ml/m   +------------+---------++-----------++  +------------------+------------++  AORTIC VALVE                      +------------------+------------++  AV Area (Vmax):    2.23 cm       +------------------+------------++  AV Area (Vmean):   2.30 cm        +------------------+------------++  AV Area (VTI):     2.85 cm       +------------------+------------++  AV Vmax:           186.00 cm/s    +------------------+------------++  AV Vmean:          126.000 cm/s   +------------------+------------++  AV VTI:            0.282 m        +------------------+------------++  AV Peak Grad:      13.8 mmHg      +------------------+------------++  AV Mean Grad:  7.0 mmHg       +------------------+------------++  LVOT Vmax:         120.00 cm/s    +------------------+------------++  LVOT Vmean:        83.600 cm/s    +------------------+------------++  LVOT VTI:          0.232 m        +------------------+------------++  LVOT/AV VTI ratio: 0.82           +------------------+------------++   +-------------+-------++  AORTA                   +-------------+-------++  Ao Root diam: 3.30 cm   +-------------+-------++  +--------------+----------++  +---------------+-----------++  MITRAL VALVE                  TRICUSPID VALVE               +--------------+----------++  +---------------+-----------++  MV Area (PHT): 5.27 cm       TR Peak grad:   18.7 mmHg     +--------------+----------++  +---------------+-----------++  MV Peak grad:  6.2 mmHg       TR Vmax:        219.00 cm/s   +--------------+----------++  +---------------+-----------++  MV Mean grad:  3.0 mmHg       Estimated RAP:  3.00 mmHg     +--------------+----------++  +---------------+-----------++  MV Vmax:       1.24 m/s       RVSP:           21.7 mmHg     +--------------+----------++  +---------------+-----------++  MV Vmean:      73.9 cm/s    +--------------+----------++  +--------------+-------+  MV VTI:        0.30 m         SHUNTS                  +--------------+----------++  +--------------+-------+  MV PHT:        41.76 msec     Systemic VTI:  0.23 m   +--------------+----------++  +--------------+-------+  MV Decel Time: 144 msec       Systemic Diam: 2.10  cm  +--------------+----------++  +--------------+-------+ +--------------+-----------++  MV E velocity: 115.00 cm/s   +--------------+-----------++  MV A velocity: 61.40 cm/s    +--------------+-----------++  MV E/A ratio:  1.87          +--------------+-----------++    Rozann Lesches MD Electronically signed by Rozann Lesches MD Signature Date/Time: 10/08/2018/10:09:41 AM     Impression:  Patient is doing very well approximately 6 weeks following complex surgery for rheumatic heart disease with aortic and mitral valve replacement using bioprosthetic tissue valve, tricuspid valve repair, Maze procedure, and repair of ascending thoracic aorta.  She has no signs or symptoms of congestive heart failure and she reports that her breathing is much better than it was prior to surgery.  She appears to be maintaining sinus rhythm.  She did recently suffer upper GI bleed related to duodenal ulcer requiring EGD and cauterization.  She is currently not anticoagulated.  Clinically she seems to be recovering quite nicely.  Hoarseness of voice and difficulty swallowing likely related to laryngeal trauma from endotracheal intubation seems to be improving.    Plan:  We have not recommended any change the patient's current medications.  However, I would encourage resuming warfarin anticoagulation once it is felt to be safe with regards to the patient's recent GI bleed.  I have encouraged the patient to  continue to gradually increase her physical activity as tolerated.  She may resume driving an automobile.  The patient has been reminded regarding the importance of dental hygiene and the lifelong need for antibiotic prophylaxis for all dental cleanings and other related invasive procedures.  The patient will return to our office in approximately 2 months.  All of her questions have been addressed.    Hannah Gu. Roxy Manns, MD 10/21/2018 11:26 AM

## 2018-10-22 ENCOUNTER — Other Ambulatory Visit: Payer: Self-pay | Admitting: *Deleted

## 2018-10-22 ENCOUNTER — Other Ambulatory Visit (HOSPITAL_COMMUNITY): Payer: Self-pay

## 2018-10-22 ENCOUNTER — Telehealth: Payer: Self-pay | Admitting: *Deleted

## 2018-10-22 DIAGNOSIS — I5033 Acute on chronic diastolic (congestive) heart failure: Secondary | ICD-10-CM | POA: Diagnosis not present

## 2018-10-22 DIAGNOSIS — Z950 Presence of cardiac pacemaker: Secondary | ICD-10-CM | POA: Diagnosis not present

## 2018-10-22 DIAGNOSIS — D649 Anemia, unspecified: Secondary | ICD-10-CM | POA: Diagnosis not present

## 2018-10-22 DIAGNOSIS — I480 Paroxysmal atrial fibrillation: Secondary | ICD-10-CM | POA: Diagnosis not present

## 2018-10-22 DIAGNOSIS — Z953 Presence of xenogenic heart valve: Secondary | ICD-10-CM

## 2018-10-22 DIAGNOSIS — D509 Iron deficiency anemia, unspecified: Secondary | ICD-10-CM

## 2018-10-22 DIAGNOSIS — I110 Hypertensive heart disease with heart failure: Secondary | ICD-10-CM | POA: Diagnosis not present

## 2018-10-22 DIAGNOSIS — R1310 Dysphagia, unspecified: Secondary | ICD-10-CM

## 2018-10-22 DIAGNOSIS — Z48812 Encounter for surgical aftercare following surgery on the circulatory system: Secondary | ICD-10-CM | POA: Diagnosis not present

## 2018-10-22 NOTE — Telephone Encounter (Signed)
Pt's daughter Lattie Haw calling to find out when pt is suppose to start back on coumadin. States it was stopped on 6/29 and lisa states her cardiologist said she needs to be back on it asap but needed to talk to the doctor who took her off. Lattie Haw states it was stopped on 6/29 and that's when she was in the hospital. She is taking a 14 day course of antibiotics and states she will finish on July 23rd or the 24th. Lattie Haw is aware dr Nicki Reaper is out of the office today.

## 2018-10-23 ENCOUNTER — Other Ambulatory Visit: Payer: Self-pay | Admitting: Thoracic Surgery (Cardiothoracic Vascular Surgery)

## 2018-10-23 ENCOUNTER — Other Ambulatory Visit (INDEPENDENT_AMBULATORY_CARE_PROVIDER_SITE_OTHER): Payer: Self-pay | Admitting: *Deleted

## 2018-10-23 DIAGNOSIS — I059 Rheumatic mitral valve disease, unspecified: Secondary | ICD-10-CM

## 2018-10-23 DIAGNOSIS — K922 Gastrointestinal hemorrhage, unspecified: Secondary | ICD-10-CM

## 2018-10-23 DIAGNOSIS — I313 Pericardial effusion (noninflammatory): Secondary | ICD-10-CM

## 2018-10-23 DIAGNOSIS — I3139 Other pericardial effusion (noninflammatory): Secondary | ICD-10-CM

## 2018-10-23 NOTE — Telephone Encounter (Signed)
Patient's family will contact Dr Olevia Perches office for their opinion on when she can resume Coumadin.

## 2018-10-23 NOTE — Telephone Encounter (Signed)
The patient had a large duodenal ulcer-she is under treatment and care by gastroenterology.  Gastroenterology is planning to do another endoscopic examination on 31 July to make sure that this area has healed.  We will send communication to gastroenterology to see if they want to start Coumadin sooner but it is possible they may want her to stay off of this through the 31st since the patient did have a severe bleed with this- we hope to hear something from gastroenterology soon -we will relate this to the family  For now patient should stay off of Coumadin A copy of this is being sent to gastroenterology for their opinion on whether or not to restart Coumadin sooner or wait until EGD is completed Further action based upon their input

## 2018-10-24 ENCOUNTER — Telehealth: Payer: Self-pay | Admitting: Family Medicine

## 2018-10-24 DIAGNOSIS — D649 Anemia, unspecified: Secondary | ICD-10-CM | POA: Diagnosis not present

## 2018-10-24 DIAGNOSIS — I5033 Acute on chronic diastolic (congestive) heart failure: Secondary | ICD-10-CM | POA: Diagnosis not present

## 2018-10-24 DIAGNOSIS — I110 Hypertensive heart disease with heart failure: Secondary | ICD-10-CM | POA: Diagnosis not present

## 2018-10-24 DIAGNOSIS — Z950 Presence of cardiac pacemaker: Secondary | ICD-10-CM | POA: Diagnosis not present

## 2018-10-24 DIAGNOSIS — Z48812 Encounter for surgical aftercare following surgery on the circulatory system: Secondary | ICD-10-CM | POA: Diagnosis not present

## 2018-10-24 DIAGNOSIS — I480 Paroxysmal atrial fibrillation: Secondary | ICD-10-CM | POA: Diagnosis not present

## 2018-10-24 NOTE — Telephone Encounter (Signed)
Please go ahead and give as requested

## 2018-10-24 NOTE — Telephone Encounter (Signed)
Verbal order given to Stacey at [facility] 

## 2018-10-24 NOTE — Telephone Encounter (Signed)
Erline Levine, PT with [facility] requesting verbal orders for PT for pt twice a week for 3 weeks, once a week for 2 weeks & once a week every other week for 4 weeks  Please advise & call Erline Levine [phone]

## 2018-10-25 DIAGNOSIS — K922 Gastrointestinal hemorrhage, unspecified: Secondary | ICD-10-CM | POA: Diagnosis not present

## 2018-10-25 LAB — HEMOGLOBIN AND HEMATOCRIT, BLOOD
HCT: 30.5 % — ABNORMAL LOW (ref 35.0–45.0)
Hemoglobin: 9.7 g/dL — ABNORMAL LOW (ref 11.7–15.5)

## 2018-10-26 NOTE — Telephone Encounter (Signed)
I talked with patient today. She is finishing therapy for H. pylori gastritis in the next few days. We can start warfarin now but will have to be stopped again for EGD. Since she had major bleed she is concerned and wants to wait until the day of EGD which is November 08, 2018. Will discuss again with her cardiologist at the time of office visit on 10/28/2018.

## 2018-10-27 NOTE — Telephone Encounter (Signed)
The additional input from gastroenterology is appreciated

## 2018-10-28 ENCOUNTER — Other Ambulatory Visit: Payer: Self-pay

## 2018-10-28 ENCOUNTER — Encounter (INDEPENDENT_AMBULATORY_CARE_PROVIDER_SITE_OTHER): Payer: Self-pay | Admitting: *Deleted

## 2018-10-28 ENCOUNTER — Ambulatory Visit (INDEPENDENT_AMBULATORY_CARE_PROVIDER_SITE_OTHER): Payer: Medicare Other | Admitting: Physician Assistant

## 2018-10-28 ENCOUNTER — Encounter: Payer: Self-pay | Admitting: Physician Assistant

## 2018-10-28 VITALS — BP 116/66 | HR 90 | Wt 115.0 lb

## 2018-10-28 DIAGNOSIS — I4819 Other persistent atrial fibrillation: Secondary | ICD-10-CM

## 2018-10-28 DIAGNOSIS — I313 Pericardial effusion (noninflammatory): Secondary | ICD-10-CM | POA: Diagnosis not present

## 2018-10-28 DIAGNOSIS — I10 Essential (primary) hypertension: Secondary | ICD-10-CM

## 2018-10-28 DIAGNOSIS — K269 Duodenal ulcer, unspecified as acute or chronic, without hemorrhage or perforation: Secondary | ICD-10-CM | POA: Diagnosis not present

## 2018-10-28 DIAGNOSIS — I3139 Other pericardial effusion (noninflammatory): Secondary | ICD-10-CM | POA: Insufficient documentation

## 2018-10-28 DIAGNOSIS — Z953 Presence of xenogenic heart valve: Secondary | ICD-10-CM

## 2018-10-28 MED ORDER — AMOXICILLIN 500 MG PO CAPS: ORAL_CAPSULE | ORAL | 3 refills | Status: DC

## 2018-10-28 NOTE — Patient Instructions (Signed)
Medication Instructions: Take Amoxicillin as directed prior to dental apts  All other medications stay the same  Labwork: none  Procedures/Testing: Your physician has requested that you have an echocardiogram. Echocardiography is a painless test that uses sound waves to create images of your heart. It provides your doctor with information about the size and shape of your heart and how well your heart's chambers and valves are working. This procedure takes approximately one hour. There are no restrictions for this procedure.   Follow-Up: With Dr.Koneswaran's next available  Any Additional Special Instructions Will Be Listed Below (If Applicable).     If you need a refill on your cardiac medications before your next appointment, please call your pharmacy.

## 2018-10-28 NOTE — Telephone Encounter (Signed)
EGD resch'd to 11/15/18, patient and daughter are aware, they will pick up instructions

## 2018-10-28 NOTE — Progress Notes (Signed)
Cardiology Office Note    Date:  10/28/2018   ID:  C[place], Paiz [birth year], MRN [id number]  PCP:  Kathyrn Drown, MD  Cardiologist: Kate Sable, MD EPS: None  Chief Complaint  Patient presents with   Hospitalization Follow-up    History of Present Illness:  Hannah Roy is a 78 y.o. female with persistent atrial fib dx 04/2018, progressive valvular disease and mild CAD by cath 08/2018 who was admitted with recurrent dizziness, falls and SOB. R/LHC with 40% prox LAD and 40% mid Cx. 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 On 5/28 she underwent bioprosthetic AV replacement, bioprosthetic MV replacement, tricuspid ring annuloplasty, repair of thoracic aortic aneurysm and MAZE with LAA clipping. Post-op course notable for  anemia, thrombocytopenia and bradycardia with PAF and intermittent 2nd degree AV block.   I saw the patient 09/23/18 and placed Zio monitor for palpitations and HR's jumping up to 110/m. I saw her 10/07/18 complaining of fatigue, decreased appetite, hoarseness and palpitations. Hbg was 7.4 and sent to ED.Found to have duodenal ulcer and underwent successful embolization of GDA 10/08/18.transfused 5 units PRBC's. Repeat endo 10/10/18 oozing duodenal ulcer on protonix. Hbg 7.8 on 7/4 discharge. Anticoagulation held per Dr. Roxy Manns. Plan to have repeat endo before resuming ASA and possibly coumadin.  Patient comes in today. Says she's getting stronger everyday. Doing light housework. Endoscopy rescheduled 8/7 and patient worried about the delay because of need for coumadin per Dr. Roxy Manns. No melena. Denies shortness of breath or chest pain. Having dental cleaning 10/31/18.   Past Medical History:  Diagnosis Date   Aortic atherosclerosis (HCC)    Aortic insufficiency, rheumatic    Ascending aorta dilatation (HCC)    Atrial fibrillation ([place]) 05/10/2018   Colon polyps    Essential hypertension, benign     History of seasonal allergies    Hypertension    Hypothyroidism    Mitral stenosis with regurgitation, rheumatic    Osteopenia 2006   Persistent atrial fibrillation    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   21 mm [facility] Ease stented bovine pericardial tissue valve   S/P ascending aortic aneurysm repair 09/05/2018   28 mm Hemashield platinum supracoronary straight graft   S/P Maze operation for atrial fibrillation 09/05/2018   Complete bilateral atrial lesion set using bipolar radiofrequency and cryothermy ablation with clipping of LA appendage   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   29 mm [facility] Mitral stented bovine pericardial tissue valve   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   28 mm Edwards mc3 ring annuloplasty   Tricuspid regurgitation     Past Surgical History:  Procedure Laterality Date   AORTIC VALVE REPLACEMENT N/A 09/05/2018   Procedure: AORTIC VALVE REPLACEMENT (AVR) USING MAGNA EASE 21MM AOTIC BIOPROSTHESIS VALVE;  Surgeon: Rexene Alberts, MD;  Location: [place];  Service: Open Heart Surgery;  Laterality: N/A;   APPENDECTOMY     CLIPPING OF ATRIAL APPENDAGE  09/05/2018   Procedure: Clipping Of Atrial Appendage;  Surgeon: Rexene Alberts, MD;  Location: [facility] OR;  Service: Open Heart Surgery;;   COLONOSCOPY  03/23/2011   repeat in 5 years,Procedure: COLONOSCOPY;  Surgeon: Rogene Houston, MD;  [facility];  Service: Endoscopy;  Laterality: N/A;  1:00   COLONOSCOPY N/A 11/09/2016   Procedure: COLONOSCOPY;  Surgeon: Rogene Houston, MD;  [facility];  Service: Endoscopy;  Laterality: N/A;  1030   ESOPHAGOGASTRODUODENOSCOPY N/A 10/10/2018   Procedure: ESOPHAGOGASTRODUODENOSCOPY (EGD);  Surgeon: Doran Stabler, MD;  Location: [place];  Service: Gastroenterology;  Laterality: N/A;   ESOPHAGOGASTRODUODENOSCOPY (EGD) WITH PROPOFOL N/A 10/08/2018   Procedure:  ESOPHAGOGASTRODUODENOSCOPY (EGD) WITH PROPOFOL;  Surgeon: Danie Binder, MD;  [facility];  Service: Endoscopy;  Laterality: N/A;   HOT HEMOSTASIS N/A 10/10/2018   Procedure: HOT HEMOSTASIS (ARGON PLASMA COAGULATION/BICAP);  Surgeon: Doran Stabler, MD;  Location: [place];  Service: Gastroenterology;  Laterality: N/A;   IR ANGIOGRAM SELECTIVE EACH ADDITIONAL VESSEL  10/08/2018   IR ANGIOGRAM VISCERAL SELECTIVE  10/08/2018   IR EMBO ART  VEN HEMORR LYMPH EXTRAV  INC GUIDE ROADMAPPING  10/08/2018   IR US GUIDE VASC ACCESS RIGHT  10/08/2018   MAZE N/A 09/05/2018   Procedure: MAZE;  Surgeon: Rexene Alberts, MD;  Location: [place];  Service: Open Heart Surgery;  Laterality: N/A;   MITRAL VALVE REPLACEMENT N/A 09/05/2018   Procedure: MITRAL VALVE (MV) REPLACEMENT USING MAGNA MITRAL EASE 29MM BIOPROSTHESIS VALVE;  Surgeon: Rexene Alberts, MD;  Location: [place];  Service: Open Heart Surgery;  Laterality: N/A;   REPLACEMENT ASCENDING AORTA N/A 09/05/2018   Procedure: SUPRACORONARY STRAIGHT GRAFT REPLACEMENT OF ASCENDING AORTA;  Surgeon: Rexene Alberts, MD;  Location: [place];  Service: Open Heart Surgery;  Laterality: N/A;   RIGHT/LEFT HEART CATH AND CORONARY ANGIOGRAPHY N/A 08/29/2018   Procedure: RIGHT/LEFT HEART CATH AND CORONARY ANGIOGRAPHY;  Surgeon: Lorretta Harp, MD;  Location: [place] CV LAB;  Service: Cardiovascular;  Laterality: N/A;   TEE WITHOUT CARDIOVERSION N/A 08/29/2018   Procedure: TRANSESOPHAGEAL ECHOCARDIOGRAM (TEE);  Surgeon: Skeet Latch, MD;  Location: [place];  Service: Cardiovascular;  Laterality: N/A;   TRICUSPID VALVE REPLACEMENT N/A 09/05/2018   Procedure: TRICUSPID VALVE REPAIR USING EDWARDS MC3 TRICUSPID ANNULOPLASTY RING SIZE T28;  Surgeon: Rexene Alberts, MD;  Location: [place];  Service: Open Heart Surgery;  Laterality: N/A;   TUBAL LIGATION      Current Medications: Current Meds  Medication Sig   ALPRAZolam (XANAX) 0.25 MG  tablet 1/2 to 1 qhs prn insomnia   bisacodyl (DULCOLAX) 5 MG EC tablet Take 5 mg by mouth daily.   bismuth-metronidazole-tetracycline (PYLERA) 140-125-125 MG capsule Take 3 capsules by mouth 4 (four) times daily -  before meals and at bedtime.   FEROCON capsule TAKE ONE CAPSULE BY MOUTH DAILY WITH BREAKFAST   fluconazole (DIFLUCAN) 150 MG tablet Take one tablet by mouth 3 days apart   ondansetron (ZOFRAN) 4 MG tablet Take 1 tablet (4 mg total) by mouth every 8 (eight) hours as needed for nausea or vomiting.   pantoprazole (PROTONIX) 40 MG tablet Take 1 tablet (40 mg total) by mouth 2 (two) times daily.   traMADol (ULTRAM) 50 MG tablet Take 1 tablet (50 mg total) by mouth every 4 (four) hours as needed for moderate pain.     Allergies:   Bee venom, Boniva [ibandronic acid], Lisinopril, and Latex   Social History   Socioeconomic History   Marital status: Married    Spouse name: Not on file   Number of children: Not on file   Years of education: Not on file   Highest education level: Not on file  Occupational History   Not on file  Social Needs   Financial resource strain: Not very hard   Food insecurity    Worry: Never true  Inability: Never true   Transportation needs    Medical: No    Non-medical: No  Tobacco Use   Smoking status: Former Smoker    Packs/day: 0.50    Years: 10.00    Pack years: 5.00   Smokeless tobacco: Never Used  Substance and Sexual Activity   Alcohol use: No   Drug use: No   Sexual activity: Not on file  Lifestyle   Physical activity    Days per week: 3 days    Minutes per session: 20 min   Stress: Only a little  Relationships   Social connections    Talks on phone: More than three times a week    Gets together: Three times a week    Attends religious service: 1 to 4 times per year    Active member of club or organization: No    Attends meetings of clubs or organizations: Never    Relationship status: Married  Other  Topics Concern   Not on file  Social History Narrative   Not on file     Family History:  The patient's family history includes Colon cancer in her daughter.   ROS:   Please see the history of present illness.    Review of Systems  Constitution: Positive for malaise/fatigue.  HENT: Negative.   Eyes: Negative.   Cardiovascular: Negative.   Respiratory: Negative.   Hematologic/Lymphatic: Negative.   Musculoskeletal: Negative.  Negative for joint pain.  Gastrointestinal: Negative.   Genitourinary: Negative.   Neurological: Positive for weakness.   All other systems reviewed and are negative.   PHYSICAL EXAM:   VS:  BP 116/66    Pulse 90    Wt 115 lb (52.2 kg)    SpO2 98%    BMI 21.73 kg/m   Physical Exam  GEN: Thin, in no acute distress  Neck: no JVD, carotid bruits, or masses Cardiac:RRR; normal valve sounds Respiratory:  clear to auscultation bilaterally, normal work of breathing GI: soft, nontender, nondistended, + BS Ext: without cyanosis, clubbing, or edema, Good distal pulses bilaterally Neuro:  Alert and Oriented x 3 Psych: euthymic mood, full affect  Wt Readings from Last 3 Encounters:  10/28/18 115 lb (52.2 kg)  10/21/18 116 lb (52.6 kg)  10/16/18 120 lb 3.2 oz (54.5 kg)      Studies/Labs Reviewed:   EKG:  EKG is not ordered today.    Recent Labs: 08/31/2018: B Natriuretic Peptide 549.0; TSH 3.399 09/06/2018: Magnesium 2.2 10/17/2018: ALT 10; BUN 8; Creatinine, Ser 0.60; Platelets 321; Potassium 4.2; Sodium 141 10/25/2018: Hemoglobin 9.7   Lipid Panel    Component Value Date/Time   CHOL 195 08/03/2017 0954   TRIG 107 08/03/2017 0954   HDL 64 08/03/2017 0954   CHOLHDL 3.0 08/03/2017 0954   CHOLHDL 3.4 05/28/2014 1007   VLDL 12 05/28/2014 1007   LDLCALC 110 (H) 08/03/2017 0954    Additional studies/ records that were reviewed today include:  Echo 6/30/20IMPRESSIONS      1. The left ventricle has normal systolic function with an ejection fraction  of 60-65%. The cavity size was normal. There is moderately increased left ventricular wall thickness. Left ventricular diastolic Doppler parameters are consistent with  restrictive filling. No evidence of left ventricular regional wall motion abnormalities.  2. Left atrial size was mildly dilated.  3. A 29 mm [facility] Mitral bovine bioprosthetic valve is present in the mitral position. No significant mitral regurgitation. Valve function grossly normal with normal mean gradient.  4. A 67mm Edwards Magna Ease bovine bioprosthesis is present in the aortic position. No perivalvular leak noted. Sinuses of Valsalva seen with normal aortic root size. LVOT is small. valve function is grossly normal with normal mean gradient. No aortic  regurgitation.  5. The right ventricle has normal systolic function. The cavity was normal. There is no increase in right ventricular wall thickness. Right ventricular systolic pressure is normal with an estimated pressure of 21.7 mmHg.  6. The aortic root is normal in size and structure.  7. 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 Status post tricuspid valve repair. There is mild tricuspid regurgitation.  9. Moderate pericardial effusion. 10. The pericardial effusion is circumferential.   FINDINGS  Left Ventricle: The left ventricle has normal systolic function, with an ejection fraction of 60-65%. The cavity size was normal. There is moderately increased left ventricular wall thickness. Left ventricular diastolic Doppler parameters are consistent  with restrictive filling. No evidence of left ventricular regional wall motion abnormalities..   Right Ventricle: The right ventricle has normal systolic function. The cavity was normal. There is no increase in right ventricular wall thickness. Right ventricular systolic pressure is normal with an estimated pressure of 28.7 mmHg.   Left Atrium: Left atrial size was mildly dilated.   Right Atrium: Right atrial size was normal  in size. Right atrial pressure is estimated at 10 mmHg.   Interatrial Septum: No atrial level shunt detected by color flow Doppler.   Pericardium: A moderately sized pericardial effusion is present. The pericardial effusion is circumferential.   Mitral Valve: The mitral valve has been repaired/replaced. Mitral valve regurgitation is not visualized by color flow Doppler. A 29 mm [facility] Mitral bioprosthetic valve is present in the mitral position.   Tricuspid Valve: The tricuspid valve is rheumatic. Tricuspid valve regurgitation is mild by color flow Doppler. The tricuspid valve is status post repair with an annuloplasty ring. valve is present in the tricuspid position.   Aortic Valve: The aortic valve has been repaired/replaced Aortic valve regurgitation was not visualized by color flow Doppler. A 10mm Edwards Magna Ease bovine aortic valve bioprosthesis valve is present in the aortic position.   Pulmonic Valve: The pulmonic valve was not well visualized. Pulmonic valve regurgitation is trivial by color flow Doppler.   Aorta: The aortic root is normal in size and structure.   Venous: The inferior vena cava is normal in size with greater than 50% respiratory variability.         ASSESSMENT:    1. S/P aortic + mitral valve replacement with bioprosthetic valves + tricuspid valve repair + maze procedure + repair ascending aortic aneurysm   2. Duodenal ulcer   3. Persistent atrial fibrillation   4. Essential hypertension, benign   5. Pericardial effusion      PLAN:  In order of problems listed above:  GI bleed requiring 5 units packed RBCs and successful embolization of GDA 10/08/2018 repeat Endo 7/2 status post APC oozing of the duodenal ulcer on Protonix twice daily.  For repeat Endo 11/12/2018-patient concerned that this was rescheduled because Dr. Roxy Manns told her she should get back on Coumadin and soon as possible.  Status post aortic and mitral valve replacement with  bioprosthetic valves and tricuspid valve repair with maze procedure and repair of a ascending aortic aneurysm by Dr. Roxy Manns.  Currently off anticoagulation because of GI bleed.  To have endoscopy before deciding on aspirin and Coumadin.  SBE prophylaxis for dental work given  Persistent  atrial fibrillation with some bradycardia and intermittent second-degree AV block. Was in NSR in hospital ZIO monitor placed-monitor sent back last Monday.  Still do not have the results.  Symptoms have improved.  Essential hypertension tends to run low  Pericardial effusion moderate on echo 10/08/2018, bedside echo 10/10/28 no change in size of effusion. Plan for f/u echo  to reassess.   Medication Adjustments/Labs and Tests Ordered: Current medicines are reviewed at length with the patient today.  Concerns regarding medicines are outlined above.  Medication changes, Labs and Tests ordered today are listed in the Patient Instructions below. Patient Instructions  Medication Instructions: Take Amoxicillin as directed prior to dental apts  All other medications stay the same  Labwork: none  Procedures/Testing: Your physician has requested that you have an echocardiogram. Echocardiography is a painless test that uses sound waves to create images of your heart. It provides your doctor with information about the size and shape of your heart and how well your hearts chambers and valves are working. This procedure takes approximately one hour. There are no restrictions for this procedure.   Follow-Up: With Dr.Koneswaran's next available  Any Additional Special Instructions Will Be Listed Below (If Applicable).     If you need a refill on your cardiac medications before your next appointment, please call your pharmacy.      Signed, Ermalinda Barrios, PA-C  10/28/2018 2:19 PM    [facility] [address], [place], [place]  [address] Phone: [phone]; Fax: [phone]

## 2018-10-29 ENCOUNTER — Telehealth: Payer: Self-pay | Admitting: Physician Assistant

## 2018-10-29 DIAGNOSIS — D649 Anemia, unspecified: Secondary | ICD-10-CM | POA: Diagnosis not present

## 2018-10-29 DIAGNOSIS — I5033 Acute on chronic diastolic (congestive) heart failure: Secondary | ICD-10-CM | POA: Diagnosis not present

## 2018-10-29 DIAGNOSIS — I110 Hypertensive heart disease with heart failure: Secondary | ICD-10-CM | POA: Diagnosis not present

## 2018-10-29 DIAGNOSIS — I480 Paroxysmal atrial fibrillation: Secondary | ICD-10-CM | POA: Diagnosis not present

## 2018-10-29 DIAGNOSIS — Z950 Presence of cardiac pacemaker: Secondary | ICD-10-CM | POA: Diagnosis not present

## 2018-10-29 DIAGNOSIS — Z48812 Encounter for surgical aftercare following surgery on the circulatory system: Secondary | ICD-10-CM | POA: Diagnosis not present

## 2018-10-29 NOTE — Telephone Encounter (Signed)
New message   1. What dental office are you calling from? Dr Gavin Potters office   2. What is your office phone number? [phone]  3. What is your fax number? [phone]  4. What type of procedure is the patient having performed? Xray , dental cleaning and possible bridge being fixed   5. What date is procedure scheduled or is the patient there now? 7/23/11a (if the patient is at the dentist's office question goes to their cardiologist if he/she is in the office.  If not, question should go to the DOD).   6. What is your question (ex. Antibiotics prior to procedure, holding medication-we need to know how long dentist wants pt to hold med)? Patient has premed from [place] but she is on blood thinner and they need instructions , should patient hold or is this ok to do

## 2018-10-29 NOTE — Telephone Encounter (Signed)
I spoke with dental office, informed them that patient is currently off anticoagulants and she has dental prophy for teeth cleaning this week

## 2018-10-30 ENCOUNTER — Other Ambulatory Visit: Payer: Self-pay

## 2018-10-30 ENCOUNTER — Ambulatory Visit (HOSPITAL_COMMUNITY)
Admission: RE | Admit: 2018-10-30 | Discharge: 2018-10-30 | Disposition: A | Discharge status: Home / Self Care | Payer: Medicare Other | Source: Ambulatory Visit | Attending: Thoracic Surgery (Cardiothoracic Vascular Surgery) | Admitting: Thoracic Surgery (Cardiothoracic Vascular Surgery)

## 2018-10-30 DIAGNOSIS — I059 Rheumatic mitral valve disease, unspecified: Secondary | ICD-10-CM

## 2018-10-30 DIAGNOSIS — I313 Pericardial effusion (noninflammatory): Secondary | ICD-10-CM | POA: Diagnosis not present

## 2018-10-30 DIAGNOSIS — I3139 Other pericardial effusion (noninflammatory): Secondary | ICD-10-CM

## 2018-10-30 NOTE — Progress Notes (Signed)
*  PRELIMINARY RESULTS* Echocardiogram 2D Echocardiogram has been performed.  Hannah Roy 10/30/2018, 12:25 PM

## 2018-11-01 ENCOUNTER — Telehealth: Payer: Self-pay | Admitting: Family Medicine

## 2018-11-01 DIAGNOSIS — Z48812 Encounter for surgical aftercare following surgery on the circulatory system: Secondary | ICD-10-CM | POA: Diagnosis not present

## 2018-11-01 DIAGNOSIS — I5033 Acute on chronic diastolic (congestive) heart failure: Secondary | ICD-10-CM | POA: Diagnosis not present

## 2018-11-01 DIAGNOSIS — D649 Anemia, unspecified: Secondary | ICD-10-CM | POA: Diagnosis not present

## 2018-11-01 DIAGNOSIS — Z950 Presence of cardiac pacemaker: Secondary | ICD-10-CM | POA: Diagnosis not present

## 2018-11-01 DIAGNOSIS — I110 Hypertensive heart disease with heart failure: Secondary | ICD-10-CM | POA: Diagnosis not present

## 2018-11-01 DIAGNOSIS — I480 Paroxysmal atrial fibrillation: Secondary | ICD-10-CM | POA: Diagnosis not present

## 2018-11-01 NOTE — Telephone Encounter (Signed)
Please advise. Thank you

## 2018-11-01 NOTE — Telephone Encounter (Signed)
Contacted Bethena Roys and gave verbal orders for 2 more visits to work on speech

## 2018-11-01 NOTE — Telephone Encounter (Signed)
Hannah Roy is requesting 2 more visits to work on speech with pt.   CB# [phone]

## 2018-11-01 NOTE — Telephone Encounter (Signed)
Please go ahead with this verbal order

## 2018-11-04 ENCOUNTER — Ambulatory Visit (HOSPITAL_COMMUNITY)
Admission: RE | Admit: 2018-11-04 | Discharge: 2018-11-04 | Disposition: A | Discharge status: Home / Self Care | Payer: Medicare Other | Source: Ambulatory Visit | Attending: Thoracic Surgery (Cardiothoracic Vascular Surgery) | Admitting: Thoracic Surgery (Cardiothoracic Vascular Surgery)

## 2018-11-04 ENCOUNTER — Other Ambulatory Visit: Payer: Self-pay

## 2018-11-04 ENCOUNTER — Ambulatory Visit (HOSPITAL_COMMUNITY)
Admission: RE | Admit: 2018-11-04 | Discharge: 2018-11-04 | Disposition: A | Discharge status: Home / Self Care | Payer: Medicare Other | Source: Ambulatory Visit | Attending: Internal Medicine | Admitting: Internal Medicine

## 2018-11-04 DIAGNOSIS — R1310 Dysphagia, unspecified: Secondary | ICD-10-CM

## 2018-11-04 DIAGNOSIS — R1312 Dysphagia, oropharyngeal phase: Secondary | ICD-10-CM | POA: Insufficient documentation

## 2018-11-04 DIAGNOSIS — Z952 Presence of prosthetic heart valve: Secondary | ICD-10-CM | POA: Insufficient documentation

## 2018-11-04 NOTE — Progress Notes (Signed)
Modified Barium Swallow Progress Note  Patient Details  Name: Hannah Roy MRN: [id number] Date of Birth: 1940-10-21  Today's Date: 11/04/2018  Modified Barium Swallow completed.  Full report located under Chart Review in the Imaging Section.  Brief recommendations include the following:  Clinical Impression  Pt's oropharyngeal swallow is within gross functional limits, with no aspiration observed across testing. She has piecemeal swallowing throughout trials and reduced posterior transit specifically with a barium tablet, but this appears to be behavioral and/or compensatory in nature. She does have reduced relaxation of her UES with what appears to be a small amount of outpouching in her cervical esophagus. There is trace to mild residue that remains after the swallow in this location but it clears well and does not re-enter the pharynx. The barium tablet was transiently stopped in the esophagus as confirmed with MD, but with good clearance given an additional bolus of puree. From an oropharyngeal standpoint, would continue regular solids and thin liquids. Pt does not need to use a chin tuck, as she says she was previously instructed to do. Given the above as well as pt's recent GI issues and report of improved function after starting protonix, would consider further esophageal w/u if symptoms persist.   Swallow Evaluation Recommendations   Recommended Consults: Consider esophageal assessment;Consider GI evaluation   SLP Diet Recommendations: Regular solids;Thin liquid   Liquid Administration via: Cup;Straw   Medication Administration: Whole meds with liquid   Supervision: Patient able to self feed   Compensations: Slow rate;Small sips/bites;Follow solids with liquid   Postural Changes: Seated upright at 90 degrees   Oral Care Recommendations: Oral care BID        Venita Sheffield Shynia Daleo 11/04/2018,2:13 PM   Pollyann Glen, M.A. [place] Acute Environmental education officer [phone] Office  [phone]

## 2018-11-06 ENCOUNTER — Other Ambulatory Visit (HOSPITAL_COMMUNITY): Payer: Medicare Other

## 2018-11-06 DIAGNOSIS — Z48812 Encounter for surgical aftercare following surgery on the circulatory system: Secondary | ICD-10-CM | POA: Diagnosis not present

## 2018-11-06 DIAGNOSIS — I480 Paroxysmal atrial fibrillation: Secondary | ICD-10-CM | POA: Diagnosis not present

## 2018-11-06 DIAGNOSIS — D649 Anemia, unspecified: Secondary | ICD-10-CM | POA: Diagnosis not present

## 2018-11-06 DIAGNOSIS — I5033 Acute on chronic diastolic (congestive) heart failure: Secondary | ICD-10-CM | POA: Diagnosis not present

## 2018-11-06 DIAGNOSIS — I110 Hypertensive heart disease with heart failure: Secondary | ICD-10-CM | POA: Diagnosis not present

## 2018-11-06 DIAGNOSIS — Z950 Presence of cardiac pacemaker: Secondary | ICD-10-CM | POA: Diagnosis not present

## 2018-11-11 ENCOUNTER — Other Ambulatory Visit: Payer: Self-pay | Admitting: Family Medicine

## 2018-11-11 DIAGNOSIS — I110 Hypertensive heart disease with heart failure: Secondary | ICD-10-CM | POA: Diagnosis not present

## 2018-11-11 DIAGNOSIS — D649 Anemia, unspecified: Secondary | ICD-10-CM | POA: Diagnosis not present

## 2018-11-11 DIAGNOSIS — I5033 Acute on chronic diastolic (congestive) heart failure: Secondary | ICD-10-CM | POA: Diagnosis not present

## 2018-11-11 DIAGNOSIS — Z48812 Encounter for surgical aftercare following surgery on the circulatory system: Secondary | ICD-10-CM | POA: Diagnosis not present

## 2018-11-11 DIAGNOSIS — I480 Paroxysmal atrial fibrillation: Secondary | ICD-10-CM | POA: Diagnosis not present

## 2018-11-11 DIAGNOSIS — Z950 Presence of cardiac pacemaker: Secondary | ICD-10-CM | POA: Diagnosis not present

## 2018-11-12 ENCOUNTER — Encounter (HOSPITAL_COMMUNITY): Payer: Self-pay

## 2018-11-12 ENCOUNTER — Encounter (HOSPITAL_COMMUNITY)
Admission: RE | Admit: 2018-11-12 | Discharge: 2018-11-12 | Disposition: A | Discharge status: Home / Self Care | Payer: Medicare Other | Source: Ambulatory Visit | Attending: Internal Medicine | Admitting: Internal Medicine

## 2018-11-12 ENCOUNTER — Other Ambulatory Visit: Payer: Self-pay

## 2018-11-13 ENCOUNTER — Encounter (HOSPITAL_COMMUNITY): Payer: Self-pay

## 2018-11-13 ENCOUNTER — Encounter (HOSPITAL_COMMUNITY)
Admission: RE | Admit: 2018-11-13 | Discharge: 2018-11-13 | Disposition: A | Discharge status: Home / Self Care | Payer: Medicare Other | Source: Ambulatory Visit | Attending: Cardiovascular Disease | Admitting: Cardiovascular Disease

## 2018-11-13 VITALS — BP 160/84 | HR 75 | Temp 99.0°F | Ht 61.0 in | Wt 122.8 lb

## 2018-11-13 DIAGNOSIS — Z9889 Other specified postprocedural states: Secondary | ICD-10-CM | POA: Diagnosis not present

## 2018-11-13 DIAGNOSIS — Z953 Presence of xenogenic heart valve: Secondary | ICD-10-CM

## 2018-11-13 NOTE — Progress Notes (Signed)
Cardiac Individual Treatment Plan  Patient Details  Name: Hannah Roy MRN: [id number] Date of Birth: [birth year] Referring Provider:     CARDIAC REHAB PHASE II ORIENTATION from 11/13/2018 in [place]  Referring Provider  [name]       Initial Encounter Date:    CARDIAC REHAB PHASE II ORIENTATION from 11/13/2018 in [place]  Date  11/13/18      Visit Diagnosis: S/P aortic valve replacement with bioprosthetic valve   S/P tricuspid valve repair   Patient's Home Medications on Admission:  Current Outpatient Medications:  .  warfarin (COUMADIN) 2.5 MG tablet, Take 2.5 mg by mouth daily. To start back 11/18/18, Disp: , Rfl:  .  ALPRAZolam (XANAX) 0.25 MG tablet, 1/2 to 1 qhs prn insomnia (Patient not taking: Reported on 11/13/2018), Disp: 20 tablet, Rfl: 0 .  amoxicillin (AMOXIL) 500 MG capsule, Take 2 grams (4 tablets) 1 hr prior to dental procedure, Disp: 4 capsule, Rfl: 3 .  bismuth-metronidazole-tetracycline (PYLERA) 140-125-125 MG capsule, Take 3 capsules by mouth 4 (four) times daily -  before meals and at bedtime. (Patient not taking: Reported on 11/07/2018), Disp: 120 capsule, Rfl: 0 .  FEROCON capsule, TAKE ONE CAPSULE BY MOUTH DAILY WITH BREAKFAST (Patient taking differently: Take 1 capsule by mouth every morning. ), Disp: 30 capsule, Rfl: 0 .  ondansetron (ZOFRAN) 4 MG tablet, Take 1 tablet (4 mg total) by mouth every 8 (eight) hours as needed for nausea or vomiting. (Patient not taking: Reported on 11/07/2018), Disp: 30 tablet, Rfl: 0 .  pantoprazole (PROTONIX) 40 MG tablet, Take 1 tablet (40 mg total) by mouth 2 (two) times daily., Disp: 60 tablet, Rfl: 0 .  traMADol (ULTRAM) 50 MG tablet, Take 1 tablet (50 mg total) by mouth every 4 (four) hours as needed for moderate pain. (Patient not taking: Reported on 11/07/2018), Disp: 20 tablet, Rfl: 0  Past Medical History: Past Medical History:  Diagnosis Date  . Aortic atherosclerosis  ([employer])   . Aortic insufficiency, rheumatic   . 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 Atrial fibrillation ([place]) 05/10/2018  . Colon polyps   . Essential hypertension, benign   . History of seasonal allergies   . Hypertension   . Hypothyroidism   . Mitral stenosis with regurgitation, rheumatic   . Osteopenia 2006  . Persistent atrial fibrillation   . 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   21 mm [employer] Ease stented bovine pericardial tissue valve  . S/P ascending aortic aneurysm repair 09/05/2018   28 mm Hemashield platinum supracoronary straight graft  . S/P Maze operation for atrial fibrillation 09/05/2018   Complete bilateral atrial lesion set using bipolar radiofrequency and cryothermy ablation with clipping of LA appendage  . 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   29 mm [facility] Mitral stented bovine pericardial tissue valve  . 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 Tricuspid regurgitation     Tobacco Use: Social History   Tobacco Use  Smoking Status Former Smoker  . Packs/day: 0.50  . Years: 10.00  . Pack years: 5.00  . Types: Cigarettes  Smokeless Tobacco Never Used    Labs: Recent Review Flowsheet Data    Labs for ITP Cardiac and Pulmonary Rehab Latest Ref Rng & Units 09/05/2018 09/05/2018 09/05/2018 09/06/2018 09/06/2018   Cholestrol 100 - 199 mg/dL - - - - -   LDLCALC 0 - 99 mg/dL - - - - -  HDL >39 mg/dL - - - - -   Trlycerides 0 - 149 mg/dL - - - - -   Hemoglobin A1c 4.8 - 5.6 % - - - - -   PHART 7.350 - 7.450 7.362 7.365 - 7.357 7.367   PCO2ART 32.0 - 48.0 mmHg 38.4 39.0 - 37.6 38.9   HCO3 20.0 - 28.0 mmol/L 22.0 22.5 - 21.0 22.3   TCO2 22 - 32 mmol/L 23 24 22 22 23    ACIDBASEDEF 0.0 - 2.0 mmol/L 3.0(H) 3.0(H) - 4.0(H) 3.0(H)   O2SAT % 98.0 100.0 - 100.0 99.0      Capillary Blood Glucose: Lab Results  Component Value Date   GLUCAP 89 09/09/2018   GLUCAP  93 09/08/2018   GLUCAP 110 (H) 09/08/2018   GLUCAP 121 (H) 09/08/2018   GLUCAP 124 (H) 09/08/2018     Exercise Target Goals: Exercise Program Goal: Individual exercise prescription set using results from initial 6 min walk test and THRR while considering  patient's activity barriers and safety.   Exercise Prescription Goal: Starting with aerobic activity 30 plus minutes a day, 3 days per week for initial exercise prescription. Provide home exercise prescription and guidelines that participant acknowledges understanding prior to discharge.  Activity Barriers & Risk Stratification: Activity Barriers & Cardiac Risk Stratification - 11/13/18 1346      Activity Barriers & Cardiac Risk Stratification   Activity Barriers  None    Cardiac Risk Stratification  High       6 Minute Walk: 6 Minute Walk    Row Name 11/13/18 1344         6 Minute Walk   Phase  Initial     Distance  1200 feet     Walk Time  6 minutes     # of Rest Breaks  0     MPH  2.27     METS  2.74     RPE  9     Perceived Dyspnea   9     VO2 Peak  9.4     Symptoms  No     Resting HR  75 bpm     Resting BP  160/84     Resting Oxygen Saturation   98 %     Exercise Oxygen Saturation  during 6 min walk  96 %     Max Ex. HR  95 bpm     Max Ex. BP  166/80     2 Minute Post BP  148/80        Oxygen Initial Assessment:   Oxygen Re-Evaluation:   Oxygen Discharge (Final Oxygen Re-Evaluation):   Initial Exercise Prescription: Initial Exercise Prescription - 11/13/18 1300      Date of Initial Exercise RX and Referring Provider   Date  11/13/18    Referring Provider  [facility]     Expected Discharge Date  02/13/19      Treadmill   MPH  1.3    Grade  0    Minutes  17    METs  1.99      NuStep   Level  1    SPM  60    Minutes  17    METs  1.6      Prescription Details   Frequency (times per week)  3    Duration  Progress to 30 minutes of continuous aerobic without signs/symptoms of physical  distress      Intensity   THRR 40-80% of Max Heartrate  [phone]    Ratings of Perceived Exertion  11-13    Perceived Dyspnea  0-4      Progression   Progression  Continue to progress workloads to maintain intensity without signs/symptoms of physical distress.      Resistance Training   Training Prescription  Yes    Weight  1    Reps  10-15       Perform Capillary Blood Glucose checks as needed.  Exercise Prescription Changes:  Exercise Prescription Changes    Row Name 11/13/18 1300             Response to Exercise   Blood Pressure (Admit)  160/84       Blood Pressure (Exercise)  166/80       Blood Pressure (Exit)  148/80       Heart Rate (Admit)  75 bpm       Heart Rate (Exercise)  95 bpm       Heart Rate (Exit)  85 bpm       Oxygen Saturation (Admit)  98 %       Oxygen Saturation (Exercise)  96 %       Oxygen Saturation (Exit)  99 %       Rating of Perceived Exertion (Exercise)  9       Perceived Dyspnea (Exercise)  7       Symptoms  none       Comments  6 minute walk tes t       Duration  Progress to 30 minutes of  aerobic without signs/symptoms of physical distress       Intensity  THRR New [phone]         Home Exercise Plan   Plans to continue exercise at  Home (comment)       Frequency  Add 2 additional days to program exercise sessions.       Initial Home Exercises Provided  11/13/18          Exercise Comments:   Exercise Goals and Review:  Exercise Goals    Row Name 11/13/18 1348             Exercise Goals   Increase Physical Activity  Yes       Intervention  Provide advice, education, support and counseling about physical activity/exercise needs.;Develop an individualized exercise prescription for aerobic and resistive training based on initial evaluation findings, risk stratification, comorbidities and participant's personal goals.       Expected Outcomes  Short Term: Attend rehab on a regular basis to increase amount of physical  activity.;Long Term: Add in home exercise to make exercise part of routine and to increase amount of physical activity.;Long Term: Exercising regularly at least 3-5 days a week.       Increase Strength and Stamina  Yes       Intervention  Provide advice, education, support and counseling about physical activity/exercise needs.;Develop an individualized exercise prescription for aerobic and resistive training based on initial evaluation findings, risk stratification, comorbidities and participant's personal goals.       Expected Outcomes  Short Term: Increase workloads from initial exercise prescription for resistance, speed, and METs.;Short Term: Perform resistance training exercises routinely during rehab and add in resistance training at home;Long Term: Improve cardiorespiratory fitness, muscular endurance and strength as measured by increased METs and functional capacity (6MWT)       Able to understand and use rate of perceived exertion (RPE) scale  Yes  Intervention  Provide education and explanation on how to use RPE scale       Expected Outcomes  Short Term: Able to use RPE daily in rehab to express subjective intensity level;Long Term:  Able to use RPE to guide intensity level when exercising independently       Knowledge and understanding of Target Heart Rate Range (THRR)  Yes       Intervention  Provide education and explanation of THRR including how the numbers were predicted and where they are located for reference       Expected Outcomes  Short Term: Able to state/look up THRR;Long Term: Able to use THRR to govern intensity when exercising independently;Short Term: Able to use daily as guideline for intensity in rehab       Able to check pulse independently  Yes       Intervention  Provide education and demonstration on how to check pulse in carotid and radial arteries.;Review the importance of being able to check your own pulse for safety during independent exercise       Expected  Outcomes  Short Term: Able to explain why pulse checking is important during independent exercise;Long Term: Able to check pulse independently and accurately       Understanding of Exercise Prescription  Yes       Intervention  Provide education, explanation, and written materials on patient's individual exercise prescription       Expected Outcomes  Short Term: Able to explain program exercise prescription;Long Term: Able to explain home exercise prescription to exercise independently          Exercise Goals Re-Evaluation :    Discharge Exercise Prescription (Final Exercise Prescription Changes): Exercise Prescription Changes - 11/13/18 1300      Response to Exercise   Blood Pressure (Admit)  160/84    Blood Pressure (Exercise)  166/80    Blood Pressure (Exit)  148/80    Heart Rate (Admit)  75 bpm    Heart Rate (Exercise)  95 bpm    Heart Rate (Exit)  85 bpm    Oxygen Saturation (Admit)  98 %    Oxygen Saturation (Exercise)  96 %    Oxygen Saturation (Exit)  99 %    Rating of Perceived Exertion (Exercise)  9    Perceived Dyspnea (Exercise)  7    Symptoms  none    Comments  6 minute walk tes t    Duration  Progress to 30 minutes of  aerobic without signs/symptoms of physical distress    Intensity  THRR New   [phone]     Home Exercise Plan   Plans to continue exercise at  Home (comment)    Frequency  Add 2 additional days to program exercise sessions.    Initial Home Exercises Provided  11/13/18       Nutrition:  Target Goals: Understanding of nutrition guidelines, daily intake of sodium 1500mg , cholesterol 200mg , calories 30% from fat and 7% or less from saturated fats, daily to have 5 or more servings of fruits and vegetables.  Biometrics: Pre Biometrics - 11/13/18 1348      Pre Biometrics   Height  5\' 1"  (1.549 m)    Waist Circumference  28 inches    Hip Circumference  34 inches    Waist to Hip Ratio  0.82 %    Triceps Skinfold  8 mm    % Body Fat  28.7 %     Grip Strength  5.7 kg  Single Leg Stand  37 seconds        Nutrition Therapy Plan and Nutrition Goals: Nutrition Therapy & Goals - 11/13/18 1431      Nutrition Therapy   Diet  Heart healthy.      Personal Nutrition Goals   Nutrition Goal  Continue to eat heart healthy.    Additional Goals?  No      Intervention Plan   Intervention  Nutrition handout(s) given to patient.       Nutrition Assessments: Nutrition Assessments - 11/13/18 1429      MEDFICTS Scores   Pre Score  21       Nutrition Goals Re-Evaluation:   Nutrition Goals Discharge (Final Nutrition Goals Re-Evaluation):   Psychosocial: Target Goals: Acknowledge presence or absence of significant depression and/or stress, maximize coping skills, provide positive support system. Participant is able to verbalize types and ability to use techniques and skills needed for reducing stress and depression.  Initial Review & Psychosocial Screening: Initial Psych Review & Screening - 11/13/18 1429      Initial Review   Current issues with  None Identified      Family Dynamics   Good Support System?  Yes      Barriers   Psychosocial barriers to participate in program  There are no identifiable barriers or psychosocial needs.      Screening Interventions   Interventions  Encouraged to exercise;Provide feedback about the scores to participant    Expected Outcomes  Long Term goal: The participant improves quality of Life and PHQ9 Scores as seen by post scores and/or verbalization of changes       Quality of Life Scores: Quality of Life - 11/13/18 1349      Quality of Life   Select  Quality of Life      Quality of Life Scores   Health/Function Pre  21.5 %    Socioeconomic Pre  24 %    Psych/Spiritual Pre  24 %    Family Pre  22.8 %    GLOBAL Pre  22.72 %      Scores of 19 and below usually indicate a poorer quality of life in these areas.  A difference of  2-3 points is a clinically meaningful difference.   A difference of 2-3 points in the total score of the Quality of Life Index has been associated with significant improvement in overall quality of life, self-image, physical symptoms, and general health in studies assessing change in quality of life.  PHQ-9: Recent Review Flowsheet Data    Depression screen [facility] 2/9 11/13/2018 05/02/2017 01/12/2015 10/17/2013   Decreased Interest 0 0 0 0   Down, Depressed, Hopeless 0 0 0 0   PHQ - 2 Score 0 0 0 0   Altered sleeping 1 1 - -   Tired, decreased energy 1 2 - -   Change in appetite 0 0 - -   Feeling bad or failure about yourself  0 0 - -   Trouble concentrating 0 0 - -   Moving slowly or fidgety/restless 0 1 - -   Suicidal thoughts 0 0 - -   PHQ-9 Score 2 4 - -   Difficult doing work/chores Not difficult at all - - -     Interpretation of Total Score  Total Score Depression Severity:  1-4 = Minimal depression, 5-9 = Mild depression, 10-14 = Moderate depression, 15-19 = Moderately severe depression, 20-27 = Severe depression   Psychosocial Evaluation and Intervention:  Psychosocial Evaluation - 11/13/18 1430      Psychosocial Evaluation & Interventions   Interventions  Encouraged to exercise with the program and follow exercise prescription;Stress management education;Relaxation education    Comments  Patient has no psychosocial issues identified. Her initial QOL score was 22.72 and her PHQ-9 score was 2.    Expected Outcomes  Patient will have no psychosoical issues identified at discharge.    Continue Psychosocial Services   No Follow up required       Psychosocial Re-Evaluation:   Psychosocial Discharge (Final Psychosocial Re-Evaluation):   Vocational Rehabilitation: Provide vocational rehab assistance to qualifying candidates.   Vocational Rehab Evaluation & Intervention: Vocational Rehab - 11/13/18 1428      Initial Vocational Rehab Evaluation & Intervention   Assessment shows need for Vocational Rehabilitation  No       Vocational Rehab Re-Evaulation   Comments  Patient is retired and not interested in returning to work.       Education: Education Goals: Education classes will be provided on a weekly basis, covering required topics. Participant will state understanding/return demonstration of topics presented.  Learning Barriers/Preferences: Learning Barriers/Preferences - 11/13/18 1429      Learning Barriers/Preferences   Learning Barriers  None    Learning Preferences  Skilled Demonstration       Education Topics: Hypertension, Hypertension Reduction -Define heart disease and high blood pressure. Discus how high blood pressure affects the body and ways to reduce high blood pressure.   Exercise and Your Heart -Discuss why it is important to exercise, the FITT principles of exercise, normal and abnormal responses to exercise, and how to exercise safely.   Angina -Discuss definition of angina, causes of angina, treatment of angina, and how to decrease risk of having angina.   Cardiac Medications -Review what the following cardiac medications are used for, how they affect the body, and side effects that may occur when taking the medications.  Medications include Aspirin, Beta blockers, calcium channel blockers, ACE Inhibitors, angiotensin receptor blockers, diuretics, digoxin, and antihyperlipidemics.   Congestive Heart Failure -Discuss the definition of CHF, how to live with CHF, the signs and symptoms of CHF, and how keep track of weight and sodium intake.   Heart Disease and Intimacy -Discus the effect sexual activity has on the heart, how changes occur during intimacy as we age, and safety during sexual activity.   Smoking Cessation / COPD -Discuss different methods to quit smoking, the health benefits of quitting smoking, and the definition of COPD.   Nutrition I: Fats -Discuss the types of cholesterol, what cholesterol does to the heart, and how cholesterol levels can be  controlled.   Nutrition II: Labels -Discuss the different components of food labels and how to read food label   Heart Parts/Heart Disease and PAD -Discuss the anatomy of the heart, the pathway of blood circulation through the heart, and these are affected by heart disease.   Stress I: Signs and Symptoms -Discuss the causes of stress, how stress may lead to anxiety and depression, and ways to limit stress.   Stress II: Relaxation -Discuss different types of relaxation techniques to limit stress.   Warning Signs of Stroke / TIA -Discuss definition of a stroke, what the signs and symptoms are of a stroke, and how to identify when someone is having stroke.   Knowledge Questionnaire Score: Knowledge Questionnaire Score - 11/13/18 1428      Knowledge Questionnaire Score   Pre Score  23/24  Core Components/Risk Factors/Patient Goals at Admission: Personal Goals and Risk Factors at Admission - 11/13/18 1433      Core Components/Risk Factors/Patient Goals on Admission    Weight Management  Weight Maintenance    Personal Goal Other  Yes    Personal Goal  Get back to normal ADL's; Do what she did before her surgery.    Intervention  Patient will attend CR 3 days/week and will supplement with exercise at home 2 days/week.    Expected Outcomes  Patient will meet her personal goals.       Core Components/Risk Factors/Patient Goals Review:    Core Components/Risk Factors/Patient Goals at Discharge (Final Review):    ITP Comments:   Comments: Patient arrived for 1st visit/orientation/education at 1230. Patient was referred to CR by Dr. Roxy Manns due to S/P aortic valve replacement with bioprosthetic valve (Z95.3) and S/P tricuspid valve repair (Q73.419). During orientation advised patient on arrival and appointment times what to wear, what to do before, during and after exercise. Reviewed attendance and class policy. Talked about inclement weather and class consultation policy.  Pt is scheduled to return Cardiac Rehab on 11/18/18 at 8:15. Pt was advised to come to class 15 minutes before class starts. Patient was also given instructions on meeting with the dietician and attending the Family Structure classes. Discussed RPE/Dpysnea scales. Discussed initial THR and how to find their radial and/or carotid pulse. Discussed the initial exercise prescription and how this effects their progress. Pt is eager to get started. Patient participated in warm up stretches followed by light weights and resistance bands. Patient was able to complete 6 minute walk test. Discussed equipment safety with patient. Took patient pre-anthropometric measurements. Patient finished visit at 1415.

## 2018-11-13 NOTE — Progress Notes (Signed)
Cardiac/Pulmonary Rehab Medication Review by a Pharmacist  Does the patient  feel that his/her medications are working for him/her?  yes  Has the patient been experiencing any side effects to the medications prescribed?  no  Does the patient measure his/her own blood pressure or blood glucose at home?  yes   Does the patient have any problems obtaining medications due to transportation or finances?   no  Understanding of regimen: excellent Understanding of indications: excellent Potential of compliance: excellent  Questions asked to Determine Patient Understanding of Medication Regimen:  1. What is the name of the medication?  2. What is the medication used for?  3. When should it be taken?  4. How much should be taken?  5. How will you take it?  6. What side effects should you report?  Understanding Defined as: Excellent: All questions above are correct Good: Questions 1-4 are correct Fair: Questions 1-2 are correct  Poor: 1 or none of the above questions are correct   Pharmacist comments: A very pleasant 78 yo female presents today for cardiac rehab. We reviewed her medications. She is tolerating her current regimen without any problems. She had a recent UGI bleed and completed a regimen for H. Pylori and remains on protonix BID. She is to restart her coumadin on Monday.  Thanks for the opportunity to participate in the care of this patient,  Isac Sarna, BS Vena [place], [place] Pharmacist Pager [phone] 11/13/2018 2:08 PM

## 2018-11-13 NOTE — Progress Notes (Signed)
Daily Session Note  Patient Details  Name: SIMRAT KENDRICK MRN: [id number] Date of Birth: [birth year] Referring Provider:     CARDIAC REHAB PHASE II ORIENTATION from 11/13/2018 in [place]  Referring Provider  [name]       Encounter Date: 11/13/2018  Check In: Session Check In - 11/13/18 1230      Check-In   Supervising physician immediately available to respond to emergencies  See telemetry face sheet for immediately available ER MD    Location  [facility]    Staff Present  Russella Dar, MS, EP, [facility], Exercise Physiologist;Megha Agnes Wynetta Emery, RN, [facility], Exercise Physiologist    Virtual Visit  No    Medication changes reported      No    Fall or balance concerns reported     No    Tobacco Cessation  --   Patient quit smoking in 1999.   Warm-up and Cool-down  Performed as group-led Higher education careers adviser Performed  Yes    VAD Patient?  No    PAD/SET Patient?  No      Pain Assessment   Currently in Pain?  No/denies    Multiple Pain Sites  No       Capillary Blood Glucose: No results found for this or any previous visit (from the past 24 hour(s)).  Exercise Prescription Changes - 11/13/18 1300      Response to Exercise   Blood Pressure (Admit)  160/84    Blood Pressure (Exercise)  166/80    Blood Pressure (Exit)  148/80    Heart Rate (Admit)  75 bpm    Heart Rate (Exercise)  95 bpm    Heart Rate (Exit)  85 bpm    Oxygen Saturation (Admit)  98 %    Oxygen Saturation (Exercise)  96 %    Oxygen Saturation (Exit)  99 %    Rating of Perceived Exertion (Exercise)  9    Perceived Dyspnea (Exercise)  7    Symptoms  none    Comments  6 minute walk tes t    Duration  Progress to 30 minutes of  aerobic without signs/symptoms of physical distress    Intensity  THRR New   [phone]     Home Exercise Plan   Plans to continue exercise at  Home (comment)    Frequency  Add 2 additional days to program exercise sessions.     Initial Home Exercises Provided  11/13/18       Social History   Tobacco Use  Smoking Status Former Smoker  . Packs/day: 0.50  . Years: 10.00  . Pack years: 5.00  . Types: Cigarettes  Smokeless Tobacco Never Used    Goals Met:  Exercise tolerated well Personal goals reviewed No report of cardiac concerns or symptoms Strength training completed today  Goals Unmet:  Not Applicable  Comments: Patient's orientation visit today. Check out at 1415.   Dr. Kate Sable is Medical Director for [facility] Card[facility] and [facility].

## 2018-11-14 ENCOUNTER — Other Ambulatory Visit (HOSPITAL_COMMUNITY)
Admission: RE | Admit: 2018-11-14 | Discharge: 2018-11-14 | Disposition: A | Discharge status: Home / Self Care | Payer: Medicare Other | Source: Ambulatory Visit | Attending: Internal Medicine | Admitting: Internal Medicine

## 2018-11-14 ENCOUNTER — Other Ambulatory Visit: Payer: Self-pay

## 2018-11-14 ENCOUNTER — Ambulatory Visit: Payer: Medicare Other | Admitting: Family Medicine

## 2018-11-14 DIAGNOSIS — Z20828 Contact with and (suspected) exposure to other viral communicable diseases: Secondary | ICD-10-CM | POA: Diagnosis not present

## 2018-11-14 DIAGNOSIS — Z01812 Encounter for preprocedural laboratory examination: Secondary | ICD-10-CM | POA: Diagnosis not present

## 2018-11-14 LAB — SARS CORONAVIRUS 2 (TAT 6-24 HRS): SARS Coronavirus 2: NEGATIVE

## 2018-11-15 ENCOUNTER — Encounter (HOSPITAL_COMMUNITY)
Admission: RE | Disposition: A | Discharge status: Home / Self Care | Payer: Self-pay | Source: Home / Self Care | Attending: Internal Medicine

## 2018-11-15 ENCOUNTER — Ambulatory Visit (HOSPITAL_COMMUNITY): Payer: Medicare Other | Admitting: Anesthesiology

## 2018-11-15 ENCOUNTER — Encounter (HOSPITAL_COMMUNITY): Payer: Self-pay | Admitting: *Deleted

## 2018-11-15 ENCOUNTER — Ambulatory Visit (HOSPITAL_COMMUNITY)
Admission: RE | Admit: 2018-11-15 | Discharge: 2018-11-15 | Disposition: A | Discharge status: Home / Self Care | Payer: Medicare Other | Attending: Internal Medicine | Admitting: Internal Medicine

## 2018-11-15 ENCOUNTER — Other Ambulatory Visit: Payer: Self-pay

## 2018-11-15 DIAGNOSIS — Z91030 Bee allergy status: Secondary | ICD-10-CM | POA: Diagnosis not present

## 2018-11-15 DIAGNOSIS — I10 Essential (primary) hypertension: Secondary | ICD-10-CM | POA: Diagnosis not present

## 2018-11-15 DIAGNOSIS — Z91040 Latex allergy status: Secondary | ICD-10-CM | POA: Insufficient documentation

## 2018-11-15 DIAGNOSIS — M8580 Other specified disorders of bone density and structure, unspecified site: Secondary | ICD-10-CM | POA: Diagnosis not present

## 2018-11-15 DIAGNOSIS — Z800 Family history of malignant neoplasm of digestive organs: Secondary | ICD-10-CM | POA: Insufficient documentation

## 2018-11-15 DIAGNOSIS — Z79899 Other long term (current) drug therapy: Secondary | ICD-10-CM | POA: Insufficient documentation

## 2018-11-15 DIAGNOSIS — K922 Gastrointestinal hemorrhage, unspecified: Secondary | ICD-10-CM | POA: Diagnosis not present

## 2018-11-15 DIAGNOSIS — E039 Hypothyroidism, unspecified: Secondary | ICD-10-CM | POA: Insufficient documentation

## 2018-11-15 DIAGNOSIS — I700 Atherosclerosis of aorta: Secondary | ICD-10-CM | POA: Diagnosis not present

## 2018-11-15 DIAGNOSIS — Z8711 Personal history of peptic ulcer disease: Secondary | ICD-10-CM | POA: Insufficient documentation

## 2018-11-15 DIAGNOSIS — Z8719 Personal history of other diseases of the digestive system: Secondary | ICD-10-CM | POA: Diagnosis not present

## 2018-11-15 DIAGNOSIS — Z7901 Long term (current) use of anticoagulants: Secondary | ICD-10-CM | POA: Insufficient documentation

## 2018-11-15 DIAGNOSIS — Z87891 Personal history of nicotine dependence: Secondary | ICD-10-CM | POA: Insufficient documentation

## 2018-11-15 DIAGNOSIS — I4819 Other persistent atrial fibrillation: Secondary | ICD-10-CM | POA: Insufficient documentation

## 2018-11-15 DIAGNOSIS — M1990 Unspecified osteoarthritis, unspecified site: Secondary | ICD-10-CM | POA: Insufficient documentation

## 2018-11-15 DIAGNOSIS — Z8679 Personal history of other diseases of the circulatory system: Secondary | ICD-10-CM | POA: Diagnosis not present

## 2018-11-15 DIAGNOSIS — K449 Diaphragmatic hernia without obstruction or gangrene: Secondary | ICD-10-CM | POA: Insufficient documentation

## 2018-11-15 DIAGNOSIS — Z953 Presence of xenogenic heart valve: Secondary | ICD-10-CM | POA: Diagnosis not present

## 2018-11-15 DIAGNOSIS — Z888 Allergy status to other drugs, medicaments and biological substances status: Secondary | ICD-10-CM | POA: Diagnosis not present

## 2018-11-15 DIAGNOSIS — K3189 Other diseases of stomach and duodenum: Secondary | ICD-10-CM

## 2018-11-15 DIAGNOSIS — Z09 Encounter for follow-up examination after completed treatment for conditions other than malignant neoplasm: Secondary | ICD-10-CM

## 2018-11-15 DIAGNOSIS — K269 Duodenal ulcer, unspecified as acute or chronic, without hemorrhage or perforation: Secondary | ICD-10-CM

## 2018-11-15 HISTORY — PX: ESOPHAGOGASTRODUODENOSCOPY (EGD) WITH PROPOFOL: SHX5813

## 2018-11-15 LAB — POCT HEMOGLOBIN-HEMACUE: Hemoglobin: 9.2 g/dL — ABNORMAL LOW (ref 12.0–15.0)

## 2018-11-15 LAB — HEMOGLOBIN AND HEMATOCRIT, BLOOD
HCT: 31 % — ABNORMAL LOW (ref 36.0–46.0)
Hemoglobin: 9.6 g/dL — ABNORMAL LOW (ref 12.0–15.0)

## 2018-11-15 SURGERY — ESOPHAGOGASTRODUODENOSCOPY (EGD) WITH PROPOFOL
Anesthesia: General

## 2018-11-15 MED ORDER — MEPERIDINE HCL 50 MG/ML IJ SOLN: 6.2500 mg | INTRAMUSCULAR | Status: DC | PRN

## 2018-11-15 MED ORDER — CHLORHEXIDINE GLUCONATE CLOTH 2 % EX PADS: 6.0000 | MEDICATED_PAD | Freq: Once | CUTANEOUS | Status: DC

## 2018-11-15 MED ORDER — PROPOFOL 10 MG/ML IV BOLUS
INTRAVENOUS | Status: DC | PRN
  Administered 2018-11-15: 20 mg via INTRAVENOUS

## 2018-11-15 MED ORDER — PROPOFOL 500 MG/50ML IV EMUL
INTRAVENOUS | Status: DC | PRN
  Administered 2018-11-15: 150 ug/kg/min via INTRAVENOUS

## 2018-11-15 MED ORDER — STERILE WATER FOR IRRIGATION IR SOLN
Status: DC | PRN
  Administered 2018-11-15: 2.5 mL

## 2018-11-15 MED ORDER — LACTATED RINGERS IV SOLN
Freq: Once | INTRAVENOUS | Status: AC
  Administered 2018-11-15: 1000 mL via INTRAVENOUS

## 2018-11-15 MED ORDER — LACTATED RINGERS IV SOLN
INTRAVENOUS | Status: DC | PRN
  Administered 2018-11-15: 07:00:00 via INTRAVENOUS

## 2018-11-15 MED ORDER — ONDANSETRON HCL 4 MG/2ML IJ SOLN: 4.0000 mg | Freq: Once | INTRAMUSCULAR | Status: DC | PRN

## 2018-11-15 MED ORDER — PANTOPRAZOLE SODIUM 40 MG PO TBEC: 40.0000 mg | DELAYED_RELEASE_TABLET | Freq: Every day | ORAL | 11 refills | Status: DC

## 2018-11-15 MED ORDER — PROPOFOL 10 MG/ML IV BOLUS
INTRAVENOUS | Status: AC
  Filled 2018-11-15: qty 40

## 2018-11-15 NOTE — Op Note (Signed)
[facility] Patient Name: Hannah Roy Procedure Date: 11/15/2018 7:08 AM MRN: [id number] Date of Birth: 11-06-1940 Attending MD: Hildred Laser , MD CSN: [id number] Age: 78 Admit Type: Outpatient Procedure:                Upper GI endoscopy Indications:              Follow-up of duodenal ulcer Providers:                Hildred Laser, MD, Lurline Del, RN, Raphael Gibney,                            Technician Referring MD:             Elayne Snare Luking, MD Medicines:                Propofol per Anesthesia Complications:            No immediate complications. Estimated Blood Loss:     Estimated blood loss: none. Procedure:                Pre-Anesthesia Assessment:                           - Prior to the procedure, a History and Physical                            was performed, and patient medications and                            allergies were reviewed. The patient's tolerance of                            previous anesthesia was also reviewed. The risks                            and benefits of the procedure and the sedation                            options and risks were discussed with the patient.                            All questions were answered, and informed consent                            was obtained. Prior Anticoagulants: The patient has                            taken no previous anticoagulant or antiplatelet                            agents. ASA Grade Assessment: III - A patient with                            severe systemic disease. After reviewing the risks  and benefits, the patient was deemed in                            satisfactory condition to undergo the procedure.                           After obtaining informed consent, the endoscope was                            passed under direct vision. Throughout the                            procedure, the patient's blood pressure, pulse, and                            oxygen  saturations were monitored continuously. The                            [id number]-H190 ([id number]) scope was introduced through the                            mouth, and advanced to the second part of duodenum.                            The upper GI endoscopy was accomplished without                            difficulty. The patient tolerated the procedure                            well. Scope In: 7:39:28 AM Scope Out: 7:43:31 AM Total Procedure Duration: 0 hours 4 minutes 3 seconds  Findings:      The examined esophagus was normal.      The Z-line was regular and was found 40 cm from the incisors.      A 2 cm hiatal hernia was present.      A healed ulcer was found on the anterior wall of the gastric antrum.      The exam of the stomach was otherwise normal.      The duodenal bulb and second portion of the duodenum were normal. Impression:               - Normal esophagus.                           - Z-line regular, 40 cm from the incisors.                           - 2 cm hiatal hernia.                           - Scar in the gastric antrum (anterior wall).                           - Normal duodenal bulb and second portion of the  duodenum.                           - No specimens collected. Moderate Sedation:      Per Anesthesia Care Recommendation:           - Patient has a contact number available for                            emergencies. The signs and symptoms of potential                            delayed complications were discussed with the                            patient. Return to normal activities tomorrow.                            Written discharge instructions were provided to the                            patient.                           - Low sodium diet today.                           - Decrease Pantoprazole to once daily but continue                            present medications.                           - No NSAIDs.                            - H/H and stool H/ Pylori antigen in one month.                           - Resume Coumadin (warfarin) at prior dose today.                            Refer to [facility] for further adjustment of                            therapy. Procedure Code(s):        --- Professional ---                           [phone], Esophagogastroduodenoscopy, flexible,                            transoral; diagnostic, including collection of                            specimen(s) by brushing or washing, when performed                            (  separate procedure) Diagnosis Code(s):        --- Professional ---                           K44.9, Diaphragmatic hernia without obstruction or                            gangrene                           K31.89, Other diseases of stomach and duodenum                           K26.9, Duodenal ulcer, unspecified as acute or                            chronic, without hemorrhage or perforation CPT copyright 2019 American Medical Association. All rights reserved. The codes documented in this report are preliminary and upon coder review may  be revised to meet current compliance requirements. Hildred Laser, MD Hildred Laser, MD 11/15/2018 7:56:07 AM This report has been signed electronically. Number of Addenda: 0

## 2018-11-15 NOTE — Transfer of Care (Signed)
Immediate Anesthesia Transfer of Care Note  Patient: Hannah Roy  Procedure(s) Performed: ESOPHAGOGASTRODUODENOSCOPY (EGD) WITH PROPOFOL (N/A )  Patient Location: PACU  Anesthesia Type:General  Level of Consciousness: awake  Airway & Oxygen Therapy: Patient Spontanous Breathing  Post-op Assessment: Report given to RN  Post vital signs: Reviewed  Last Vitals:  Vitals Value Taken Time  BP 128/77 11/15/18 0752  Temp 36.9 C 11/15/18 0752  Pulse 68 11/15/18 0754  Resp 16 11/15/18 0754  SpO2 99 % 11/15/18 0754  Vitals shown include unvalidated device data.  Last Pain:  Vitals:   11/15/18 0752  TempSrc:   PainSc: 0-No pain      Patients Stated Pain Goal: 8 (18/56/31 4970)  Complications: No apparent anesthesia complications

## 2018-11-15 NOTE — H&P (Signed)
Hannah Roy is an 78 y.o. female.   Chief Complaint: Patient is here for EGD. HPI: Patient is 78 year old female who underwent aortic and mitral valve replacement with a bioprosthetic tissue along with repair of tricuspid valve and maze procedure on 09/05/2018.  Patient was anticoagulated.  She presented with upper GI bleed about 5 weeks ago.  At the time of EGD at this facility she had significant bleed and bleeding site could not be seen.  She was transferred to [facility].  She underwent angiography.  No active bleeding was noted.  Prophylactic coil embolization of gastroduodenal artery was performed.  Follow-up EGD revealed duodenal ulcer with oozing and was treated with APC.  Her anticoagulant has been held.  She has been treated with pantoprazole twice daily.  Her H. pylori serology was positive and she has been treated.  She has no GI complaints.  She denies nausea vomiting heartburn dysphagia abdominal pain melena or rectal bleeding.  She says she has a good appetite and has gained few pounds.  She does complain of anterior chest soreness when she takes a deep breath. Her hemoglobin 3 weeks ago was 9.7 g.  Fingerstick reveals 9.2.  It would be confirmed by the lab.   Past Medical History:  Diagnosis Date  . Aortic atherosclerosis ([place])   . Aortic insufficiency, rheumatic   . 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 Atrial fibrillation ([place]) 05/10/2018  . Colon polyps   . Essential hypertension, benign   . History of seasonal allergies   . Hypertension   . Hypothyroidism   . Mitral stenosis with regurgitation, rheumatic   . Osteopenia 2006  . Persistent atrial fibrillation   . 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   21 mm [facility] Ease stented bovine pericardial tissue valve  . S/P ascending aortic aneurysm repair 09/05/2018   28 mm Hemashield platinum supracoronary straight graft  . S/P Maze operation for atrial fibrillation 09/05/2018   Complete bilateral atrial  lesion set using bipolar radiofrequency and cryothermy ablation with clipping of LA appendage  . 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   29 mm [facility] Mitral stented bovine pericardial tissue valve  . 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 Tricuspid regurgitation     Past Surgical History:  Procedure Laterality Date  . AORTIC VALVE REPLACEMENT N/A 09/05/2018   Procedure: AORTIC VALVE REPLACEMENT (AVR) USING MAGNA EASE 21MM AOTIC BIOPROSTHESIS VALVE;  Surgeon: Rexene Alberts, MD;  Location: [place];  Service: Open Heart Surgery;  Laterality: N/A;  . APPENDECTOMY    . CLIPPING OF ATRIAL APPENDAGE  09/05/2018   Procedure: Clipping Of Atrial Appendage;  Surgeon: Rexene Alberts, MD;  Location: [place];  Service: Open Heart Surgery;;  . COLONOSCOPY  03/23/2011   repeat in 5 years,Procedure: COLONOSCOPY;  Surgeon: Rogene Houston, MD;  [facility];  Service: Endoscopy;  Laterality: N/A;  1:00  . COLONOSCOPY N/A 11/09/2016   Procedure: COLONOSCOPY;  Surgeon: Rogene Houston, MD;  [facility];  Service: Endoscopy;  Laterality: N/A;  1030  . ESOPHAGOGASTRODUODENOSCOPY N/A 10/10/2018   Procedure: ESOPHAGOGASTRODUODENOSCOPY (EGD);  Surgeon: Doran Stabler, MD;  Location: [place];  Service: Gastroenterology;  Laterality: N/A;  . ESOPHAGOGASTRODUODENOSCOPY (EGD) WITH PROPOFOL N/A 10/08/2018   Procedure: ESOPHAGOGASTRODUODENOSCOPY (EGD) WITH PROPOFOL;  Surgeon: Danie Binder, MD;  [facility];  Service: Endoscopy;  Laterality: N/A;  .  HOT HEMOSTASIS N/A 10/10/2018   Procedure: HOT HEMOSTASIS (ARGON PLASMA COAGULATION/BICAP);  Surgeon: Doran Stabler, MD;  Location: [place];  Service: Gastroenterology;  Laterality: N/A;  . IR ANGIOGRAM SELECTIVE EACH ADDITIONAL VESSEL  10/08/2018  . IR ANGIOGRAM VISCERAL SELECTIVE  10/08/2018  . IR EMBO ART  VEN HEMORR LYMPH EXTRAV  INC GUIDE ROADMAPPING   10/08/2018  . IR US GUIDE VASC ACCESS RIGHT  10/08/2018  . MAZE N/A 09/05/2018   Procedure: MAZE;  Surgeon: Rexene Alberts, MD;  Location: [place];  Service: Open Heart Surgery;  Laterality: N/A;  . MITRAL VALVE REPLACEMENT N/A 09/05/2018   Procedure: MITRAL VALVE (MV) REPLACEMENT USING MAGNA MITRAL EASE 29MM BIOPROSTHESIS VALVE;  Surgeon: Rexene Alberts, MD;  Location: [place];  Service: Open Heart Surgery;  Laterality: N/A;  . REPLACEMENT ASCENDING AORTA N/A 09/05/2018   Procedure: SUPRACORONARY STRAIGHT GRAFT REPLACEMENT OF ASCENDING AORTA;  Surgeon: Rexene Alberts, MD;  Location: [place];  Service: Open Heart Surgery;  Laterality: N/A;  . RIGHT/LEFT HEART CATH AND CORONARY ANGIOGRAPHY N/A 08/29/2018   Procedure: RIGHT/LEFT HEART CATH AND CORONARY ANGIOGRAPHY;  Surgeon: Lorretta Harp, MD;  Location: [place] [facility];  Service: Cardiovascular;  Laterality: N/A;  . TEE WITHOUT CARDIOVERSION N/A 08/29/2018   Procedure: TRANSESOPHAGEAL ECHOCARDIOGRAM (TEE);  Surgeon: Skeet Latch, MD;  Location: [place];  Service: Cardiovascular;  Laterality: N/A;  . TRICUSPID VALVE REPLACEMENT N/A 09/05/2018   Procedure: TRICUSPID VALVE REPAIR USING EDWARDS MC3 TRICUSPID ANNULOPLASTY RING SIZE T28;  Surgeon: Rexene Alberts, MD;  Location: [place];  Service: Open Heart Surgery;  Laterality: N/A;  . TUBAL LIGATION      Family History  Problem Relation Age of Onset  . Colon cancer Daughter        about 44   Social History:  reports that she has quit smoking. Her smoking use included cigarettes. She has a 5.00 pack-year smoking history. She has never used smokeless tobacco. She reports that she does not drink alcohol or use drugs.  Allergies:  Allergies  Allergen Reactions  . Bee Venom Swelling    Welps  . Boniva [Ibandronic Acid] Nausea And Vomiting and Other (See Comments)    Upset stomach, flu like symptoms   . Lisinopril Cough  . Latex Rash    Medications Prior to Admission  Medication Sig  Dispense Refill  . ALPRAZolam (XANAX) 0.25 MG tablet 1/2 to 1 qhs prn insomnia (Patient not taking: Reported on 11/13/2018) 20 tablet 0  . FEROCON capsule TAKE ONE CAPSULE BY MOUTH DAILY WITH BREAKFAST 30 capsule 2  . pantoprazole (PROTONIX) 40 MG tablet Take 1 tablet (40 mg total) by mouth 2 (two) times daily. 60 tablet 0  . amoxicillin (AMOXIL) 500 MG capsule Take 2 grams (4 tablets) 1 hr prior to dental procedure 4 capsule 3  . bismuth-metronidazole-tetracycline (PYLERA) 140-125-125 MG capsule Take 3 capsules by mouth 4 (four) times daily -  before meals and at bedtime. (Patient not taking: Reported on 11/07/2018) 120 capsule 0  . ondansetron (ZOFRAN) 4 MG tablet Take 1 tablet (4 mg total) by mouth every 8 (eight) hours as needed for nausea or vomiting. (Patient not taking: Reported on 11/07/2018) 30 tablet 0  . traMADol (ULTRAM) 50 MG tablet Take 1 tablet (50 mg total) by mouth every 4 (four) hours as needed for moderate pain. (Patient not taking: Reported on 11/07/2018) 20 tablet 0  . warfarin (COUMADIN) 2.5 MG tablet Take 2.5 mg by mouth daily.  To start back 11/18/18      Results for orders placed or performed during the hospital encounter of 11/15/18 (from the past 48 hour(s))  Hemoglobin-hemacue, POC     Status: Abnormal   Collection Time: 11/15/18  7:13 AM  Result Value Ref Range   Hemoglobin 9.2 (L) 12.0 - 15.0 g/dL   No results found.  ROS  Blood pressure (!) 170/95, pulse 84, temperature 98.4 F (36.9 C), temperature source Oral, resp. rate 18, SpO2 96 %. Physical Exam  Constitutional: She appears well-developed and well-nourished.  HENT:  Mouth/Throat: Oropharynx is clear and moist.  Eyes: Conjunctivae are normal. No scleral icterus.  Neck: No thyromegaly present.  Cardiovascular:  Cardiac exam with regular rhythm normal S1 and prominent S2 with grade 2/6 systolic ejection murmur best heard at aortic area.  Respiratory:  She has midsternal scar.  GI:  Abdomen is symmetrical  and soft.  She has mild tenderness midepigastrium on deep palpation.  No organomegaly or masses.  Musculoskeletal:        General: No edema.  Lymphadenopathy:    She has no cervical adenopathy.  Neurological: She is alert.  Skin: Skin is warm and dry.     Assessment/Plan  Upper GI bleed secondary to duodenal ulcer status post endoscopic intervention 5 weeks ago. Patient has been treated for H. pylori infection. EGD is being performed to document complete healing before patient goes back on anticoagulants.  Hildred Laser, MD 11/15/2018, 7:17 AM

## 2018-11-15 NOTE — Discharge Instructions (Signed)
° ° °  Upper Endoscopy, Adult, Care After This sheet gives you information about how to care for yourself after your procedure. Your health care provider may also give you more specific instructions. If you have problems or questions, contact your health care provider. What can I expect after the procedure? After the procedure, it is common to have:  A sore throat.  Mild stomach pain or discomfort.  Bloating.  Nausea. Follow these instructions at home:   Follow instructions from your health care provider about what to eat or drink after your procedure.  Return to your normal activities as told by your health care provider. Ask your health care provider what activities are safe for you.  Take over-the-counter and prescription medicines only as told by your health care provider.  Do not drive for 24 hours if you were given a sedative during your procedure.  Keep all follow-up visits as told by your health care provider. This is important. Contact a health care provider if you have:  A sore throat that lasts longer than one day.  Trouble swallowing. Get help right away if:  You vomit blood or your vomit looks like coffee grounds.  You have: ? A fever. ? Bloody, black, or tarry stools. ? A severe sore throat or you cannot swallow. ? Difficulty breathing. ? Severe pain in your chest or abdomen. Summary  After the procedure, it is common to have a sore throat, mild stomach discomfort, bloating, and nausea.  Do not drive for 24 hours if you were given a sedative during the procedure.  Follow instructions from your health care provider about what to eat or drink after your procedure.  Return to your normal activities as told by your health care provider. This information is not intended to replace advice given to you by your health care provider. Make sure you discuss any questions you have with your health care provider. Document Released: 09/26/2011 Document Revised:  09/18/2017 Document Reviewed: 08/27/2017 Elsevier Patient Education  [place]. Resume warfarin at usual dose of 2.5 mg daily.  INR check in 5 to 7 days. Decrease pantoprazole to once a day.  Take 40 mg by mouth 30 minutes before breakfast daily. Resume other medications as before. Resume diet with restrictions as discussed.  No green leafy vegetables. No driving for 24 hours. Check H&H and stool antigen for H. pylori in 1 month.  Office will call.

## 2018-11-15 NOTE — Anesthesia Postprocedure Evaluation (Signed)
Anesthesia Post Note  Patient: Hannah Roy  Procedure(s) Performed: ESOPHAGOGASTRODUODENOSCOPY (EGD) WITH PROPOFOL (N/A )  Patient location during evaluation: PACU Anesthesia Type: General Level of consciousness: awake and alert and oriented Pain management: pain level controlled Vital Signs Assessment: post-procedure vital signs reviewed and stable Respiratory status: spontaneous breathing Cardiovascular status: blood pressure returned to baseline and stable Postop Assessment: no apparent nausea or vomiting Anesthetic complications: no     Last Vitals:  Vitals:   11/15/18 0651 11/15/18 0752  BP: (!) 170/95 128/77  Pulse: 84   Resp: 18   Temp: 36.9 C 36.9 C  SpO2: 96%     Last Pain:  Vitals:   11/15/18 0752  TempSrc:   PainSc: 0-No pain                 Mikiyah Glasner

## 2018-11-15 NOTE — Anesthesia Preprocedure Evaluation (Signed)
Anesthesia Evaluation  Patient identified by MRN, date of birth, ID band Patient awake    Reviewed: Allergy & Precautions, NPO status , Patient's Chart, lab work & pertinent test results  Airway Mallampati: III  TM Distance: >3 FB Neck ROM: Full    Dental   Lower permanent bridge :   Pulmonary former smoker,    breath sounds clear to auscultation       Cardiovascular METS (patient is on cardiac rehab,s/p MVR,TVR,AAA): 3 - Mets hypertension (not on meds), + dysrhythmias (not on meds, stopped coumadin because of GI bleeding) Atrial Fibrillation + Valvular Problems/Murmurs (s/p MVR,TVR, Ascending AA repair)  Rhythm:Regular Rate:Normal  IMPRESSIONS    1. The left ventricle has normal systolic function, with an ejection fraction of 55-60%. The cavity size was normal. There is moderately increased left ventricular wall thickness. Left ventricular diastolic Doppler parameters are indeterminate.  2. The right ventricle has low normal systolic function. The cavity was mildly enlarged. There is no increase in right ventricular wall thickness.  3. Left atrial size was severely dilated.  4. Right atrial size was severely dilated.  5. Small pericardial effusion.  6. The pericardial effusion is circumferential.  7. Mildly calcified tricuspid valve leaflets.  8. Mildly thickened tricuspid valve leaflets.  9. The mitral valve is abnormal. No evidence of mitral valve stenosis. 10. MAGNA MITRAL EASE 29MM BIOPROSTHESIS VALVE is in the MV position. There is no significant perivalvular regurgitation. Mean gradient across the valve is 5 mmHg which is within expected range. 11. The tricuspid valve is abnormal. 12. EDWARDS MC3 TRICUSPID ANNULOPLASTY RING SIZE T28 is present. 13. The aortic valve is abnormal. Aortic valve regurgitation is trivial by color flow Doppler. No stenosis of the aortic valve. 14. MAGNA EASE 21MM AOTIC BIOPROSTHESIc VALVE is in the  AV position. No perivalvular regurgitation, no significant gradients across the valve (mean grad 8 mmHg). 15. The aorta is normal in size and structure. 16. The aortic root is normal in size and structure. 17. Pulmonary hypertension is is not present, PASP is 24 mmHg.   Neuro/Psych negative neurological ROS  negative psych ROS   GI/Hepatic Neg liver ROS, PUD,   Endo/Other  Hypothyroidism (Not on meds, patient denied any thyroid problems)   Renal/GU negative Renal ROS     Musculoskeletal  (+) Arthritis , Osteoarthritis,    Abdominal   Peds  Hematology  (+) anemia ,   Anesthesia Other Findings Past Medical History: Past Medical History: Diagnosis Date . Aortic atherosclerosis ([place])  . Aortic insufficiency, rheumatic  . Ascending aorta dilatation ([facility])  . Atrial fibrillation ([place]) 05/10/2018 . Colon polyps  . Essential hypertension, benign  . History of seasonal allergies  . Hypertension  . Hypothyroidism  . Mitral stenosis with regurgitation, rheumatic  . Osteopenia 2006 . Persistent atrial fibrillation  . 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  21 mm [facility] Ease stented bovine pericardial tissue valve . S/P ascending aortic aneurysm repair 09/05/2018  28 mm Hemashield platinum supracoronary straight graft . S/P Maze operation for atrial fibrillation 09/05/2018  Complete bilateral atrial lesion set using bipolar radiofrequency and cryothermy ablation with clipping of LA appendage . 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  29 mm [facility] Mitral stented bovine pericardial tissue valve . 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  28 mm Edwards mc3 ring annuloplasty . Tricuspid regurgitation     Reproductive/Obstetrics  Anesthesia Physical Anesthesia Plan  ASA: III  Anesthesia Plan: General   Post-op Pain Management:    Induction:  Intravenous  PONV Risk Score and Plan: TIVA  Airway Management Planned: Nasal Cannula and Natural Airway  Additional Equipment:   Intra-op Plan:   Post-operative Plan:   Informed Consent: I have reviewed the patients History and Physical, chart, labs and discussed the procedure including the risks, benefits and alternatives for the proposed anesthesia with the patient or authorized representative who has indicated his/her understanding and acceptance.     Dental advisory given  Plan Discussed with: CRNA  Anesthesia Plan Comments:         Anesthesia Quick Evaluation

## 2018-11-18 ENCOUNTER — Encounter (HOSPITAL_COMMUNITY)
Admission: RE | Admit: 2018-11-18 | Discharge: 2018-11-18 | Disposition: A | Discharge status: Home / Self Care | Payer: Medicare Other | Source: Ambulatory Visit | Attending: Cardiovascular Disease | Admitting: Cardiovascular Disease

## 2018-11-18 ENCOUNTER — Other Ambulatory Visit: Payer: Self-pay

## 2018-11-18 DIAGNOSIS — Z9889 Other specified postprocedural states: Secondary | ICD-10-CM | POA: Diagnosis not present

## 2018-11-18 DIAGNOSIS — Z953 Presence of xenogenic heart valve: Secondary | ICD-10-CM | POA: Diagnosis not present

## 2018-11-18 NOTE — Progress Notes (Signed)
Cardiac Individual Treatment Plan  Patient Details  Name: Hannah Roy MRN: [id number] Date of Birth: 06-16-1940 Referring Provider:     CARDIAC REHAB PHASE II ORIENTATION from 11/13/2018 in [place]  Referring Provider  [name]       Initial Encounter Date:    CARDIAC REHAB PHASE II ORIENTATION from 11/13/2018 in [place]  Date  11/13/18      Visit Diagnosis: S/P aortic valve replacement with bioprosthetic valve   S/P tricuspid valve repair   Patient's Home Medications on Admission:  Current Outpatient Medications:  .  amoxicillin (AMOXIL) 500 MG capsule, Take 2 grams (4 tablets) 1 hr prior to dental procedure, Disp: 4 capsule, Rfl: 3 .  FEROCON capsule, TAKE ONE CAPSULE BY MOUTH DAILY WITH BREAKFAST, Disp: 30 capsule, Rfl: 2 .  pantoprazole (PROTONIX) 40 MG tablet, Take 1 tablet (40 mg total) by mouth daily before breakfast., Disp: 30 tablet, Rfl: 11 .  warfarin (COUMADIN) 2.5 MG tablet, Take 2.5 mg by mouth daily. To start back 11/18/18, Disp: , Rfl:   Past Medical History: Past Medical History:  Diagnosis Date  . Aortic atherosclerosis ([place])   . Aortic insufficiency, rheumatic   . 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 Atrial fibrillation ([place]) 05/10/2018  . Colon polyps   . Essential hypertension, benign   . History of seasonal allergies   . Hypertension   . Hypothyroidism   . Mitral stenosis with regurgitation, rheumatic   . Osteopenia 2006  . Persistent atrial fibrillation   . 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   21 mm [facility] Ease stented bovine pericardial tissue valve  . S/P ascending aortic aneurysm repair 09/05/2018   28 mm Hemashield platinum supracoronary straight graft  . S/P [facility] operation for atrial fibrillation 09/05/2018   Complete bilateral atrial lesion set using bipolar radiofrequency and cryothermy ablation with clipping of LA appendage  . S/P mitral valve replacement  with bioprosthetic valve 09/05/2018   29 mm [facility] Mitral stented bovine pericardial tissue valve  . 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 Tricuspid regurgitation     Tobacco Use: Social History   Tobacco Use  Smoking Status Former Smoker  . Packs/day: 0.50  . Years: 10.00  . Pack years: 5.00  . Types: Cigarettes  Smokeless Tobacco Never Used    Labs: Recent Review Flowsheet Data    Labs for ITP Cardiac and Pulmonary Rehab Latest Ref Rng & Units 09/05/2018 09/05/2018 09/05/2018 09/06/2018 09/06/2018   Cholestrol 100 - 199 mg/dL - - - - -   LDLCALC 0 - 99 mg/dL - - - - -   HDL >39 mg/dL - - - - -   Trlycerides 0 - 149 mg/dL - - - - -   Hemoglobin A1c 4.8 - 5.6 % - - - - -   PHART 7.350 - 7.450 7.362 7.365 - 7.357 7.367   PCO2ART 32.0 - 48.0 mmHg 38.4 39.0 - 37.6 38.9   HCO3 20.0 - 28.0 mmol/L 22.0 22.5 - 21.0 22.3   TCO2 22 - 32 mmol/L 23 24 22 22 23    ACIDBASEDEF 0.0 - 2.0 mmol/L 3.0(H) 3.0(H) - 4.0(H) 3.0(H)   O2SAT % 98.0 100.0 - 100.0 99.0      Capillary Blood Glucose: Lab Results  Component Value Date   GLUCAP 89 09/09/2018   GLUCAP 93 09/08/2018   GLUCAP 110 (H) 09/08/2018   GLUCAP 121 (  H) 09/08/2018   GLUCAP 124 (H) 09/08/2018     Exercise Target Goals: Exercise Program Goal: Individual exercise prescription set using results from initial 6 min walk test and THRR while considering  patient's activity barriers and safety.   Exercise Prescription Goal: Starting with aerobic activity 30 plus minutes a day, 3 days per week for initial exercise prescription. Provide home exercise prescription and guidelines that participant acknowledges understanding prior to discharge.  Activity Barriers & Risk Stratification: Activity Barriers & Cardiac Risk Stratification - 11/13/18 1346      Activity Barriers & Cardiac Risk Stratification   Activity Barriers  None    Cardiac Risk Stratification  High       6 Minute Walk: 6  Minute Walk    Row Name 11/13/18 1344         6 Minute Walk   Phase  Initial     Distance  1200 feet     Walk Time  6 minutes     # of Rest Breaks  0     MPH  2.27     METS  2.74     RPE  9     Perceived Dyspnea   9     VO2 Peak  9.4     Symptoms  No     Resting HR  75 bpm     Resting BP  160/84     Resting Oxygen Saturation   98 %     Exercise Oxygen Saturation  during 6 min walk  96 %     Max Ex. HR  95 bpm     Max Ex. BP  166/80     2 Minute Post BP  148/80        Oxygen Initial Assessment:   Oxygen Re-Evaluation:   Oxygen Discharge (Final Oxygen Re-Evaluation):   Initial Exercise Prescription: Initial Exercise Prescription - 11/13/18 1300      Date of Initial Exercise RX and Referring Provider   Date  11/13/18    Referring Provider  [facility]     Expected Discharge Date  02/13/19      Treadmill   MPH  1.3    Grade  0    Minutes  17    METs  1.99      NuStep   Level  1    SPM  60    Minutes  17    METs  1.6      Prescription Details   Frequency (times per week)  3    Duration  Progress to 30 minutes of continuous aerobic without signs/symptoms of physical distress      Intensity   THRR 40-80% of Max Heartrate  [phone]    Ratings of Perceived Exertion  11-13    Perceived Dyspnea  0-4      Progression   Progression  Continue to progress workloads to maintain intensity without signs/symptoms of physical distress.      Resistance Training   Training Prescription  Yes    Weight  1    Reps  10-15       Perform Capillary Blood Glucose checks as needed.  Exercise Prescription Changes:  Exercise Prescription Changes    Row Name 11/13/18 1300             Response to Exercise   Blood Pressure (Admit)  160/84       Blood Pressure (Exercise)  166/80       Blood Pressure (Exit)  148/80       Heart Rate (Admit)  75 bpm       Heart Rate (Exercise)  95 bpm       Heart Rate (Exit)  85 bpm       Oxygen Saturation (Admit)  98 %        Oxygen Saturation (Exercise)  96 %       Oxygen Saturation (Exit)  99 %       Rating of Perceived Exertion (Exercise)  9       Perceived Dyspnea (Exercise)  7       Symptoms  none       Comments  6 minute walk tes t       Duration  Progress to 30 minutes of  aerobic without signs/symptoms of physical distress       Intensity  THRR New [phone]         Home Exercise Plan   Plans to continue exercise at  Home (comment)       Frequency  Add 2 additional days to program exercise sessions.       Initial Home Exercises Provided  11/13/18          Exercise Comments:  Exercise Comments    Row Name 11/18/18 1413           Exercise Comments  Pt. is new to the program. She has attended 2 sessions so far and has tolerated the exercise well.          Exercise Goals and Review:  Exercise Goals    Row Name 11/13/18 1348             Exercise Goals   Increase Physical Activity  Yes       Intervention  Provide advice, education, support and counseling about physical activity/exercise needs.;Develop an individualized exercise prescription for aerobic and resistive training based on initial evaluation findings, risk stratification, comorbidities and participant's personal goals.       Expected Outcomes  Short Term: Attend rehab on a regular basis to increase amount of physical activity.;Long Term: Add in home exercise to make exercise part of routine and to increase amount of physical activity.;Long Term: Exercising regularly at least 3-5 days a week.       Increase Strength and Stamina  Yes       Intervention  Provide advice, education, support and counseling about physical activity/exercise needs.;Develop an individualized exercise prescription for aerobic and resistive training based on initial evaluation findings, risk stratification, comorbidities and participant's personal goals.       Expected Outcomes  Short Term: Increase workloads from initial exercise prescription for resistance,  speed, and METs.;Short Term: Perform resistance training exercises routinely during rehab and add in resistance training at home;Long Term: Improve cardiorespiratory fitness, muscular endurance and strength as measured by increased METs and functional capacity (6MWT)       Able to understand and use rate of perceived exertion (RPE) scale  Yes       Intervention  Provide education and explanation on how to use RPE scale       Expected Outcomes  Short Term: Able to use RPE daily in rehab to express subjective intensity level;Long Term:  Able to use RPE to guide intensity level when exercising independently       Knowledge and understanding of Target Heart Rate Range (THRR)  Yes       Intervention  Provide education and explanation of THRR including how the numbers  were predicted and where they are located for reference       Expected Outcomes  Short Term: Able to state/look up THRR;Long Term: Able to use THRR to govern intensity when exercising independently;Short Term: Able to use daily as guideline for intensity in rehab       Able to check pulse independently  Yes       Intervention  Provide education and demonstration on how to check pulse in carotid and radial arteries.;Review the importance of being able to check your own pulse for safety during independent exercise       Expected Outcomes  Short Term: Able to explain why pulse checking is important during independent exercise;Long Term: Able to check pulse independently and accurately       Understanding of Exercise Prescription  Yes       Intervention  Provide education, explanation, and written materials on patient's individual exercise prescription       Expected Outcomes  Short Term: Able to explain program exercise prescription;Long Term: Able to explain home exercise prescription to exercise independently          Exercise Goals Re-Evaluation : Exercise Goals Re-Evaluation    Row Name 11/18/18 1413             Exercise Goal  Re-Evaluation   Exercise Goals Review  Increase Physical Activity;Increase Strength and Stamina;Able to understand and use rate of perceived exertion (RPE) scale;Knowledge and understanding of Target Heart Rate Range (THRR);Able to check pulse independently;Understanding of Exercise Prescription       Comments  Pt. is following exercise prescription to help her reach her goals.       Expected Outcomes  Short: increase overall activity level Long: develop exercise routine for home           Discharge Exercise Prescription (Final Exercise Prescription Changes): Exercise Prescription Changes - 11/13/18 1300      Response to Exercise   Blood Pressure (Admit)  160/84    Blood Pressure (Exercise)  166/80    Blood Pressure (Exit)  148/80    Heart Rate (Admit)  75 bpm    Heart Rate (Exercise)  95 bpm    Heart Rate (Exit)  85 bpm    Oxygen Saturation (Admit)  98 %    Oxygen Saturation (Exercise)  96 %    Oxygen Saturation (Exit)  99 %    Rating of Perceived Exertion (Exercise)  9    Perceived Dyspnea (Exercise)  7    Symptoms  none    Comments  6 minute walk tes t    Duration  Progress to 30 minutes of  aerobic without signs/symptoms of physical distress    Intensity  THRR New   [phone]     Home Exercise Plan   Plans to continue exercise at  Home (comment)    Frequency  Add 2 additional days to program exercise sessions.    Initial Home Exercises Provided  11/13/18       Nutrition:  Target Goals: Understanding of nutrition guidelines, daily intake of sodium 1500mg , cholesterol 200mg , calories 30% from fat and 7% or less from saturated fats, daily to have 5 or more servings of fruits and vegetables.  Biometrics: Pre Biometrics - 11/13/18 1348      Pre Biometrics   Height  5\' 1"  (1.549 m)    Waist Circumference  28 inches    Hip Circumference  34 inches    Waist to Hip Ratio  0.82 %  Triceps Skinfold  8 mm    % Body Fat  28.7 %    Grip Strength  5.7 kg    Single Leg Stand   37 seconds        Nutrition Therapy Plan and Nutrition Goals: Nutrition Therapy & Goals - 11/13/18 1431      Nutrition Therapy   Diet  Heart healthy.      Personal Nutrition Goals   Nutrition Goal  Continue to eat heart healthy.    Additional Goals?  No      Intervention Plan   Intervention  Nutrition handout(s) given to patient.       Nutrition Assessments: Nutrition Assessments - 11/13/18 1429      MEDFICTS Scores   Pre Score  21       Nutrition Goals Re-Evaluation:   Nutrition Goals Discharge (Final Nutrition Goals Re-Evaluation):   Psychosocial: Target Goals: Acknowledge presence or absence of significant depression and/or stress, maximize coping skills, provide positive support system. Participant is able to verbalize types and ability to use techniques and skills needed for reducing stress and depression.  Initial Review & Psychosocial Screening: Initial Psych Review & Screening - 11/13/18 1429      Initial Review   Current issues with  None Identified      Family Dynamics   Good Support System?  Yes      Barriers   Psychosocial barriers to participate in program  There are no identifiable barriers or psychosocial needs.      Screening Interventions   Interventions  Encouraged to exercise;Provide feedback about the scores to participant    Expected Outcomes  Long Term goal: The participant improves quality of Life and PHQ9 Scores as seen by post scores and/or verbalization of changes       Quality of Life Scores: Quality of Life - 11/13/18 1349      Quality of Life   Select  Quality of Life      Quality of Life Scores   Health/Function Pre  21.5 %    Socioeconomic Pre  24 %    Psych/Spiritual Pre  24 %    Family Pre  22.8 %    GLOBAL Pre  22.72 %      Scores of 19 and below usually indicate a poorer quality of life in these areas.  A difference of  2-3 points is a clinically meaningful difference.  A difference of 2-3 points in the total  score of the Quality of Life Index has been associated with significant improvement in overall quality of life, self-image, physical symptoms, and general health in studies assessing change in quality of life.  PHQ-9: Recent Review Flowsheet Data    Depression screen [facility] 2/9 11/13/2018 05/02/2017 01/12/2015 10/17/2013   Decreased Interest 0 0 0 0   Down, Depressed, Hopeless 0 0 0 0   PHQ - 2 Score 0 0 0 0   Altered sleeping 1 1 - -   Tired, decreased energy 1 2 - -   Change in appetite 0 0 - -   Feeling bad or failure about yourself  0 0 - -   Trouble concentrating 0 0 - -   Moving slowly or fidgety/restless 0 1 - -   Suicidal thoughts 0 0 - -   PHQ-9 Score 2 4 - -   Difficult doing work/chores Not difficult at all - - -     Interpretation of Total Score  Total Score Depression Severity:  1-4 =  Minimal depression, 5-9 = Mild depression, 10-14 = Moderate depression, 15-19 = Moderately severe depression, 20-27 = Severe depression   Psychosocial Evaluation and Intervention: Psychosocial Evaluation - 11/13/18 1430      Psychosocial Evaluation & Interventions   Interventions  Encouraged to exercise with the program and follow exercise prescription;Stress management education;Relaxation education    Comments  Patient has no psychosocial issues identified. Her initial QOL score was 22.72 and her PHQ-9 score was 2.    Expected Outcomes  Patient will have no psychosoical issues identified at discharge.    Continue Psychosocial Services   No Follow up required       Psychosocial Re-Evaluation:   Psychosocial Discharge (Final Psychosocial Re-Evaluation):   Vocational Rehabilitation: Provide vocational rehab assistance to qualifying candidates.   Vocational Rehab Evaluation & Intervention: Vocational Rehab - 11/13/18 1428      Initial Vocational Rehab Evaluation & Intervention   Assessment shows need for Vocational Rehabilitation  No      Vocational Rehab Re-Evaulation   Comments   Patient is retired and not interested in returning to work.       Education: Education Goals: Education classes will be provided on a weekly basis, covering required topics. Participant will state understanding/return demonstration of topics presented.  Learning Barriers/Preferences: Learning Barriers/Preferences - 11/13/18 1429      Learning Barriers/Preferences   Learning Barriers  None    Learning Preferences  Skilled Demonstration       Education Topics: Hypertension, Hypertension Reduction -Define heart disease and high blood pressure. Discus how high blood pressure affects the body and ways to reduce high blood pressure.   Exercise and Your Heart -Discuss why it is important to exercise, the FITT principles of exercise, normal and abnormal responses to exercise, and how to exercise safely.   Angina -Discuss definition of angina, causes of angina, treatment of angina, and how to decrease risk of having angina.   Cardiac Medications -Review what the following cardiac medications are used for, how they affect the body, and side effects that may occur when taking the medications.  Medications include Aspirin, Beta blockers, calcium channel blockers, ACE Inhibitors, angiotensin receptor blockers, diuretics, digoxin, and antihyperlipidemics.   Congestive Heart Failure -Discuss the definition of CHF, how to live with CHF, the signs and symptoms of CHF, and how keep track of weight and sodium intake.   Heart Disease and Intimacy -Discus the effect sexual activity has on the heart, how changes occur during intimacy as we age, and safety during sexual activity.   Smoking Cessation / COPD -Discuss different methods to quit smoking, the health benefits of quitting smoking, and the definition of COPD.   Nutrition I: Fats -Discuss the types of cholesterol, what cholesterol does to the heart, and how cholesterol levels can be controlled.   Nutrition II: Labels -Discuss the  different components of food labels and how to read food label   Heart Parts/Heart Disease and PAD -Discuss the anatomy of the heart, the pathway of blood circulation through the heart, and these are affected by heart disease.   Stress I: Signs and Symptoms -Discuss the causes of stress, how stress may lead to anxiety and depression, and ways to limit stress.   Stress II: Relaxation -Discuss different types of relaxation techniques to limit stress.   Warning Signs of Stroke / TIA -Discuss definition of a stroke, what the signs and symptoms are of a stroke, and how to identify when someone is having stroke.   Knowledge Questionnaire  Score: Knowledge Questionnaire Score - 11/13/18 1428      Knowledge Questionnaire Score   Pre Score  23/24       Core Components/Risk Factors/Patient Goals at Admission: Personal Goals and Risk Factors at Admission - 11/13/18 1433      Core Components/Risk Factors/Patient Goals on Admission    Weight Management  Weight Maintenance    Personal Goal Other  Yes    Personal Goal  Get back to normal ADL's; Do what she did before her surgery.    Intervention  Patient will attend CR 3 days/week and will supplement with exercise at home 2 days/week.    Expected Outcomes  Patient will meet her personal goals.       Core Components/Risk Factors/Patient Goals Review:    Core Components/Risk Factors/Patient Goals at Discharge (Final Review):    ITP Comments: ITP Comments    Row Name 11/18/18 1436           ITP Comments  Patient is new to the program. She has completed 2 sessions. Will continue to monitor for progress.          Comments: ITP REVIEW Patient is new to the program. She has completed 2 sessions. Will continue to monitor for progress.

## 2018-11-18 NOTE — Progress Notes (Signed)
Daily Session Note  Patient Details  Name: Hannah Roy MRN: [id number] Date of Birth: 1941-02-21 Referring Provider:     CARDIAC REHAB PHASE II ORIENTATION from 11/13/2018 in [place]  Referring Provider  [name]       Encounter Date: 11/18/2018  Check In: Session Check In - 11/18/18 0810      Check-In   Supervising physician immediately available to respond to emergencies  See telemetry face sheet for immediately available MD    Location  [facility]    Staff Present  Russella Dar, MS, EP, [facility], Exercise Physiologist;Linnaea Ahn Wynetta Emery, RN, Cory Munch, Exercise Physiologist    Virtual Visit  No    Medication changes reported      No    Fall or balance concerns reported     No    Tobacco Cessation  No Change    Warm-up and Cool-down  Performed as group-led instruction    Resistance Training Performed  Yes    VAD Patient?  No    PAD/SET Patient?  No      Pain Assessment   Currently in Pain?  No/denies    Pain Score  0-No pain    Multiple Pain Sites  No       Capillary Blood Glucose: No results found for this or any previous visit (from the past 24 hour(s)).    Social History   Tobacco Use  Smoking Status Former Smoker  . Packs/day: 0.50  . Years: 10.00  . Pack years: 5.00  . Types: Cigarettes  Smokeless Tobacco Never Used    Goals Met:  Independence with exercise equipment Exercise tolerated well No report of cardiac concerns or symptoms Strength training completed today  Goals Unmet:  Not Applicable  Comments: Pt able to follow exercise prescription today without complaint.  Will continue to monitor for progression. Check out 1030.   Dr. Kate Sable is Medical Director for [facility] Cardiac and Pulmonary Rehab.

## 2018-11-20 ENCOUNTER — Encounter (HOSPITAL_COMMUNITY)
Admission: RE | Admit: 2018-11-20 | Discharge: 2018-11-20 | Disposition: A | Discharge status: Home / Self Care | Payer: Medicare Other | Source: Ambulatory Visit | Attending: Cardiovascular Disease | Admitting: Cardiovascular Disease

## 2018-11-20 ENCOUNTER — Encounter (HOSPITAL_COMMUNITY): Payer: Medicare Other

## 2018-11-20 ENCOUNTER — Other Ambulatory Visit: Payer: Self-pay

## 2018-11-20 DIAGNOSIS — Z9889 Other specified postprocedural states: Secondary | ICD-10-CM

## 2018-11-20 DIAGNOSIS — Z953 Presence of xenogenic heart valve: Secondary | ICD-10-CM | POA: Diagnosis not present

## 2018-11-20 NOTE — Progress Notes (Signed)
Daily Session Note  Patient Details  Name: Hannah Roy MRN: [id number] Date of Birth: 1941-02-20 Referring Provider:     CARDIAC REHAB PHASE II ORIENTATION from 11/13/2018 in [name]  Referring Provider  [name]       Encounter Date: 11/20/2018  Check In: Session Check In - 11/20/18 0810      Check-In   Supervising physician immediately available to respond to emergencies  See telemetry face sheet for immediately available MD    Location  [facility]    Staff Present  Russella Dar, MS, EP, [facility], Exercise Physiologist;Veleda Mun Wynetta Emery, RN, Cory Munch, Exercise Physiologist    Virtual Visit  No    Medication changes reported      No    Fall or balance concerns reported     No    Tobacco Cessation  No Change    Warm-up and Cool-down  Performed as group-led instruction    Resistance Training Performed  Yes    VAD Patient?  No    PAD/SET Patient?  No      Pain Assessment   Currently in Pain?  No/denies    Pain Score  0-No pain    Multiple Pain Sites  No       Capillary Blood Glucose: No results found for this or any previous visit (from the past 24 hour(s)).    Social History   Tobacco Use  Smoking Status Former Smoker  . Packs/day: 0.50  . Years: 10.00  . Pack years: 5.00  . Types: Cigarettes  Smokeless Tobacco Never Used    Goals Met:  Exercise tolerated well No report of cardiac concerns or symptoms Strength training completed today  Goals Unmet:  Not Applicable  Comments: Pt able to follow exercise prescription today without complaint.  Will continue to monitor for progression. Check out 915.   Dr. Kate Sable is Medical Director for [facility] Cardiac and Pulmonary Rehab.

## 2018-11-21 ENCOUNTER — Other Ambulatory Visit: Payer: Medicare Other

## 2018-11-22 ENCOUNTER — Encounter (HOSPITAL_COMMUNITY)
Admission: RE | Admit: 2018-11-22 | Discharge: 2018-11-22 | Disposition: A | Discharge status: Home / Self Care | Payer: Medicare Other | Source: Ambulatory Visit | Attending: Cardiovascular Disease | Admitting: Cardiovascular Disease

## 2018-11-22 ENCOUNTER — Other Ambulatory Visit: Payer: Self-pay

## 2018-11-22 DIAGNOSIS — Z953 Presence of xenogenic heart valve: Secondary | ICD-10-CM | POA: Diagnosis not present

## 2018-11-22 DIAGNOSIS — Z9889 Other specified postprocedural states: Secondary | ICD-10-CM

## 2018-11-22 NOTE — Progress Notes (Signed)
Daily Session Note  Patient Details  Name: Hannah Roy MRN: [id number] Date of Birth: 21-Oct-1940 Referring Provider:     CARDIAC REHAB PHASE II ORIENTATION from 11/13/2018 in [place]  Referring Provider  [name]       Encounter Date: 11/22/2018  Check In: Session Check In - 11/22/18 0815      Check-In   Supervising physician immediately available to respond to emergencies  See telemetry face sheet for immediately available MD    Location  [facility]    Staff Present  Hannah Dar, MS, EP, [facility], Exercise Physiologist;Hannah Fernando Wynetta Emery, RN, Hannah Roy, Exercise Physiologist    Virtual Visit  No    Medication changes reported      No    Fall or balance concerns reported     No    Tobacco Cessation  No Change    Warm-up and Cool-down  Performed as group-led instruction    Resistance Training Performed  Yes    VAD Patient?  No    PAD/SET Patient?  No      Pain Assessment   Currently in Pain?  No/denies    Pain Score  0-No pain    Multiple Pain Sites  No       Capillary Blood Glucose: No results found for this or any previous visit (from the past 24 hour(s)).    Social History   Tobacco Use  Smoking Status Former Smoker  . Packs/day: 0.50  . Years: 10.00  . Pack years: 5.00  . Types: Cigarettes  Smokeless Tobacco Never Used    Goals Met:  Independence with exercise equipment Exercise tolerated well No report of cardiac concerns or symptoms Strength training completed today  Goals Unmet:  Not Applicable  Comments: Pt able to follow exercise prescription today without complaint.  Will continue to monitor for progression. Check out 915.   Dr. Kate Sable is Medical Director for [facility] Cardiac and Pulmonary Rehab.

## 2018-11-25 ENCOUNTER — Other Ambulatory Visit: Payer: Self-pay

## 2018-11-25 ENCOUNTER — Encounter (HOSPITAL_COMMUNITY)
Admission: RE | Admit: 2018-11-25 | Discharge: 2018-11-25 | Disposition: A | Discharge status: Home / Self Care | Payer: Medicare Other | Source: Ambulatory Visit | Attending: Cardiovascular Disease | Admitting: Cardiovascular Disease

## 2018-11-25 DIAGNOSIS — Z953 Presence of xenogenic heart valve: Secondary | ICD-10-CM | POA: Diagnosis not present

## 2018-11-25 DIAGNOSIS — Z9889 Other specified postprocedural states: Secondary | ICD-10-CM | POA: Diagnosis not present

## 2018-11-25 NOTE — Progress Notes (Addendum)
Daily Session Note  Patient Details  Name: Hannah Roy MRN: [id number] Date of Birth: May 18, 1940 Referring Provider:     CARDIAC REHAB PHASE II ORIENTATION from 11/13/2018 in [place]  Referring Provider  [name]       Encounter Date: 11/25/2018  Check In: Session Check In - 11/25/18 0815      Check-In   Supervising physician immediately available to respond to emergencies  See telemetry face sheet for immediately available MD    Location  [facility]    Staff Present  Russella Dar, MS, EP, [facility], Exercise Physiologist;Debra Wynetta Emery, RN, [facility], Exercise Physiologist    Virtual Visit  No    Medication changes reported      No    Fall or balance concerns reported     No    Tobacco Cessation  No Change    Warm-up and Cool-down  Performed as group-led instruction    Resistance Training Performed  Yes    VAD Patient?  No    PAD/SET Patient?  No      Pain Assessment   Currently in Pain?  No/denies    Pain Score  0-No pain    Multiple Pain Sites  No       Capillary Blood Glucose: No results found for this or any previous visit (from the past 24 hour(s)).    Social History   Tobacco Use  Smoking Status Former Smoker  . Packs/day: 0.50  . Years: 10.00  . Pack years: 5.00  . Types: Cigarettes  Smokeless Tobacco Never Used    Goals Met:  Independence with exercise equipment Exercise tolerated well No report of cardiac concerns or symptoms Strength training completed today  Goals Unmet:  Not Applicable  Comments: Pt able to follow exercise prescription today without complaint.  Will continue to monitor for progression.  Check out 0915.   Dr. Kate Sable is Medical Director for [facility] Cardiac and Pulmonary Rehab.

## 2018-11-26 ENCOUNTER — Ambulatory Visit (INDEPENDENT_AMBULATORY_CARE_PROVIDER_SITE_OTHER): Payer: Medicare Other | Admitting: Family Medicine

## 2018-11-26 ENCOUNTER — Telehealth: Payer: Self-pay | Admitting: Family Medicine

## 2018-11-26 ENCOUNTER — Encounter: Payer: Self-pay | Admitting: Family Medicine

## 2018-11-26 VITALS — BP 168/90 | Temp 97.3°F | Wt 115.4 lb

## 2018-11-26 DIAGNOSIS — I10 Essential (primary) hypertension: Secondary | ICD-10-CM | POA: Diagnosis not present

## 2018-11-26 DIAGNOSIS — D509 Iron deficiency anemia, unspecified: Secondary | ICD-10-CM | POA: Diagnosis not present

## 2018-11-26 DIAGNOSIS — Z7901 Long term (current) use of anticoagulants: Secondary | ICD-10-CM | POA: Diagnosis not present

## 2018-11-26 DIAGNOSIS — E778 Other disorders of glycoprotein metabolism: Secondary | ICD-10-CM | POA: Diagnosis not present

## 2018-11-26 DIAGNOSIS — E7849 Other hyperlipidemia: Secondary | ICD-10-CM | POA: Diagnosis not present

## 2018-11-26 LAB — POCT INR: INR: 1.1 — AB (ref 2.0–3.0)

## 2018-11-26 MED ORDER — LOSARTAN POTASSIUM 25 MG PO TABS: 25.0000 mg | ORAL_TABLET | Freq: Every day | ORAL | 5 refills | Status: DC

## 2018-11-26 NOTE — Progress Notes (Signed)
Subjective:    Patient ID: Hannah Roy, female    DOB: 08-18-1940, 78 y.o.   MRN: [id number]  HPI Pt here today for 5 month follow up regarding A Fib. Pt is in cardiac rehab and she has brought a paper from [place] regarding BP. Pt states that her blood pressure has been going up and down no matter what she does.   This patient was recently seen by gastroenterology they did a follow-up EGD that was negative for all ulcers and they started her back on Coumadin.  She is taking 2.5 mg daily she has been on this for 9 days now she denies any bleeding issues  She does have a history of hyperlipidemia she does try to watch her diet closely she is due for some lab work  She also has history of iron deficient anemia related to the recent GI bleed she is taking iron tablets but is not certain how long she needs to keep taking those for denies any problems with it denies any bleeding issues  Patient for blood pressure check up.  The patient does have hypertension.  The patient is on medication.  Patient relates compliance with meds. Todays BP reviewed with the patient. Patient denies issues with medication. Patient relates reasonable diet. Patient tries to minimize salt. Patient aware of BP goals.  Review of Systems  Constitutional: Negative for activity change, appetite change and fatigue.  HENT: Negative for congestion and rhinorrhea.   Respiratory: Negative for cough and shortness of breath.   Cardiovascular: Negative for chest pain and leg swelling.  Gastrointestinal: Negative for abdominal pain and diarrhea.  Endocrine: Negative for polydipsia and polyphagia.  Skin: Negative for color change.  Neurological: Negative for dizziness and weakness.  Psychiatric/Behavioral: Negative for behavioral problems and confusion.       Objective:   Physical Exam Vitals signs reviewed.  Constitutional:      General: She is not in acute distress. HENT:     Head: Normocephalic and atraumatic.   Eyes:     General:        Right eye: No discharge.        Left eye: No discharge.  Neck:     Trachea: No tracheal deviation.  Cardiovascular:     Rate and Rhythm: Normal rate and regular rhythm.     Heart sounds: Normal heart sounds. No murmur.  Pulmonary:     Effort: Pulmonary effort is normal. No respiratory distress.     Breath sounds: Normal breath sounds.  Lymphadenopathy:     Cervical: No cervical adenopathy.  Skin:    General: Skin is warm and dry.  Neurological:     Mental Status: She is alert.     Coordination: Coordination normal.  Psychiatric:        Behavior: Behavior normal.           Assessment & Plan:  1. Iron deficiency anemia, unspecified iron deficiency anemia type Patient is taking iron tablets but I do not think she needs to ongoing so we will check lab work and may well be able to stop medicine - CBC with Differential - Ferritin - Lipid Profile - Basic Metabolic Panel (BMET) - Albumin  2. Essential hypertension, benign Blood pressure is above what we would like to see so therefore we will start losartan 25 mg daily and patient will follow-up in 2 weeks she has an appointment with cardiology later in September our goal is to get blood pressure near 130/80 -  CBC with Differential - Ferritin - Lipid Profile - Basic Metabolic Panel (BMET) - Albumin  3. 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 Anticoagulant  long-term use Her INR is subpar she needs to increase the dose of the medicine I recommend she is currently doing Coumadin 2.5 mg daily New dose will be 1-1/2 tablet daily We will recheck the INR again in several days Ultimately she needs to be followed by Coumadin clinic regarding this issue She did have a history of a GI bleed with an ulcer but so far no signs of this currently and she is taking PPI and not taking any type of anti-inflammatories - POCT INR  A staff message was sent to the Coumadin clinic regarding this patient we will send a copy of this management  25 minutes was spent with the patient.  This statement verifies that 25 minutes was indeed spent with the patient.  More than 50% of this visit-total duration of the visit-was spent in counseling and coordination of care. The issues that the patient came in for today as reflected in the diagnosis (s) please refer to documentation for further details.

## 2018-11-26 NOTE — Telephone Encounter (Signed)
Staff message sent to Coumadin clinic patient may need follow-up Coumadin test here on Friday or at Coumadin clinic they will respond hopefully soon then we can let the patient know

## 2018-11-27 ENCOUNTER — Other Ambulatory Visit: Payer: Self-pay

## 2018-11-27 ENCOUNTER — Encounter (HOSPITAL_COMMUNITY)
Admission: RE | Admit: 2018-11-27 | Discharge: 2018-11-27 | Disposition: A | Discharge status: Home / Self Care | Payer: Medicare Other | Source: Ambulatory Visit | Attending: Cardiovascular Disease | Admitting: Cardiovascular Disease

## 2018-11-27 DIAGNOSIS — I10 Essential (primary) hypertension: Secondary | ICD-10-CM | POA: Diagnosis not present

## 2018-11-27 DIAGNOSIS — Z9889 Other specified postprocedural states: Secondary | ICD-10-CM

## 2018-11-27 DIAGNOSIS — Z953 Presence of xenogenic heart valve: Secondary | ICD-10-CM

## 2018-11-27 DIAGNOSIS — E7849 Other hyperlipidemia: Secondary | ICD-10-CM | POA: Diagnosis not present

## 2018-11-27 DIAGNOSIS — E778 Other disorders of glycoprotein metabolism: Secondary | ICD-10-CM | POA: Diagnosis not present

## 2018-11-27 DIAGNOSIS — D509 Iron deficiency anemia, unspecified: Secondary | ICD-10-CM | POA: Diagnosis not present

## 2018-11-27 NOTE — Progress Notes (Signed)
Daily Session Note  Patient Details  Name: Hannah Roy MRN: [id number] Date of Birth: 1941/01/20 Referring Provider:     [facility] from 11/13/2018 in [place]  Referring Provider  [name]       Encounter Date: 11/27/2018  Check In: Session Check In - 11/27/18 0815      Check-In   Supervising physician immediately available to respond to emergencies  See telemetry face sheet for immediately available MD    Location  [facility]    Staff Present  Russella Dar, MS, EP, [facility], Exercise Physiologist;Debra Wynetta Emery, RN, [facility], Exercise Physiologist    Virtual Visit  No    Medication changes reported      Yes    Comments  Dr. started [name] 59m daily.    Fall or balance concerns reported     No    Tobacco Cessation  No Change    Warm-up and Cool-down  Performed as group-led instruction    Resistance Training Performed  Yes    VAD Patient?  No    PAD/SET Patient?  No      Pain Assessment   Currently in Pain?  No/denies    Pain Score  0-No pain    Multiple Pain Sites  No       Capillary Blood Glucose: Results for orders placed or performed in visit on 11/26/18 (from the past 24 hour(s))  POCT INR     Status: Abnormal   Collection Time: 11/26/18  9:34 AM  Result Value Ref Range   INR 1.1 (A) 2.0 - 3.0      Social History   Tobacco Use  Smoking Status Former Smoker  . Packs/day: 0.50  . Years: 10.00  . Pack years: 5.00  . Types: Cigarettes  Smokeless Tobacco Never Used    Goals Met:  Independence with exercise equipment Exercise tolerated well No report of cardiac concerns or symptoms Strength training completed today  Goals Unmet:  Not Applicable  Comments: Pt able to follow exercise prescription today without complaint.  Will continue to monitor for progression. Check out 0915.   Dr. SKate Sableis Medical Director for [facility].

## 2018-11-28 ENCOUNTER — Telehealth: Payer: Self-pay | Admitting: *Deleted

## 2018-11-28 LAB — CBC WITH DIFFERENTIAL/PLATELET
Basophils Absolute: 0.1 10*3/uL (ref 0.0–0.2)
Basos: 1 %
EOS (ABSOLUTE): 0.2 10*3/uL (ref 0.0–0.4)
Eos: 3 %
Hematocrit: 35.3 % (ref 34.0–46.6)
Hemoglobin: 11.4 g/dL (ref 11.1–15.9)
Immature Grans (Abs): 0 10*3/uL (ref 0.0–0.1)
Immature Granulocytes: 0 %
Lymphocytes Absolute: 1.2 10*3/uL (ref 0.7–3.1)
Lymphs: 23 %
MCH: 29.7 pg (ref 26.6–33.0)
MCHC: 32.3 g/dL (ref 31.5–35.7)
MCV: 92 fL (ref 79–97)
Monocytes Absolute: 0.5 10*3/uL (ref 0.1–0.9)
Monocytes: 9 %
Neutrophils Absolute: 3.3 10*3/uL (ref 1.4–7.0)
Neutrophils: 64 %
Platelets: 231 10*3/uL (ref 150–450)
RBC: 3.84 x10E6/uL (ref 3.77–5.28)
RDW: 14.4 % (ref 11.7–15.4)
WBC: 5.1 10*3/uL (ref 3.4–10.8)

## 2018-11-28 LAB — BASIC METABOLIC PANEL
BUN/Creatinine Ratio: 19 (ref 12–28)
BUN: 14 mg/dL (ref 8–27)
CO2: 23 mmol/L (ref 20–29)
Calcium: 9.5 mg/dL (ref 8.7–10.3)
Chloride: 104 mmol/L (ref 96–106)
Creatinine, Ser: 0.73 mg/dL (ref 0.57–1.00)
GFR calc Af Amer: 93 mL/min/{1.73_m2} (ref >59)
GFR calc non Af Amer: 80 mL/min/{1.73_m2} (ref >59)
Glucose: 81 mg/dL (ref 65–99)
Potassium: 4.6 mmol/L (ref 3.5–5.2)
Sodium: 143 mmol/L (ref 134–144)

## 2018-11-28 LAB — ALBUMIN: Albumin: 4.4 g/dL (ref 3.7–4.7)

## 2018-11-28 LAB — LIPID PANEL
Chol/HDL Ratio: 3.7 ratio (ref 0.0–4.4)
Cholesterol, Total: 197 mg/dL (ref 100–199)
HDL: 53 mg/dL (ref >39)
LDL Calculated: 125 mg/dL — ABNORMAL HIGH (ref 0–99)
Triglycerides: 95 mg/dL (ref 0–149)
VLDL Cholesterol Cal: 19 mg/dL (ref 5–40)

## 2018-11-28 LAB — FERRITIN: Ferritin: 152 ng/mL — ABNORMAL HIGH (ref 15–150)

## 2018-11-28 NOTE — Telephone Encounter (Signed)
I spoke with the Coumadin clinic They have scheduled her at the [facility] office on September 1 at 12:15 PM  I would recommend the patient do a PT/INR tomorrow morning If for some reason patient absolutely cannot come Friday then on Monday needs to have it done We will be following her until they take over on September 1 Please establish a Coumadin sheet for her 2.5 mg tablet, currently taking 1-1/2 every day

## 2018-11-28 NOTE — Telephone Encounter (Signed)
Discussed with pt. Pt verbalized understanding and nurse visit scheduled for tomorrow for INR

## 2018-11-28 NOTE — Telephone Encounter (Signed)
Dr. Wolfgang Phoenix called and stated that the pt had been taking off Coumadin after she had a gastric ulcer and GI bleed and a week and half ago her ulcer was healed and she resumed Coumadin. Dr. Wolfgang Phoenix has been managing since resuming Coumadin and would like [place] to resume as we have been managing in the past and she is on it for Cardiac issues. He is aware we do not have very many appts and he will manage her INR until we are able to do so. Made the pt an appt and he is aware and he will see the pt next week and since they are on Epic the INR will be in Epic. Will forward to [employer] as she will need to be aware.

## 2018-11-29 ENCOUNTER — Encounter (HOSPITAL_COMMUNITY)
Admission: RE | Admit: 2018-11-29 | Discharge: 2018-11-29 | Disposition: A | Discharge status: Home / Self Care | Payer: Medicare Other | Source: Ambulatory Visit | Attending: Cardiovascular Disease | Admitting: Cardiovascular Disease

## 2018-11-29 ENCOUNTER — Ambulatory Visit: Payer: Medicare Other

## 2018-11-29 ENCOUNTER — Telehealth: Payer: Self-pay | Admitting: *Deleted

## 2018-11-29 ENCOUNTER — Ambulatory Visit (INDEPENDENT_AMBULATORY_CARE_PROVIDER_SITE_OTHER): Payer: Medicare Other | Admitting: *Deleted

## 2018-11-29 ENCOUNTER — Other Ambulatory Visit: Payer: Self-pay

## 2018-11-29 DIAGNOSIS — Z7901 Long term (current) use of anticoagulants: Secondary | ICD-10-CM

## 2018-11-29 DIAGNOSIS — Z5181 Encounter for therapeutic drug level monitoring: Secondary | ICD-10-CM

## 2018-11-29 DIAGNOSIS — Z9889 Other specified postprocedural states: Secondary | ICD-10-CM | POA: Diagnosis not present

## 2018-11-29 DIAGNOSIS — Z953 Presence of xenogenic heart valve: Secondary | ICD-10-CM

## 2018-11-29 DIAGNOSIS — I4891 Unspecified atrial fibrillation: Secondary | ICD-10-CM

## 2018-11-29 LAB — POCT INR: INR: 1.2 — AB (ref 2.0–3.0)

## 2018-11-29 NOTE — Telephone Encounter (Signed)
Pt asked for bw results while at nurse visit today for INR. Explained to pt results have not been signed off by dr Nicki Reaper. She asked to have a copy. I printed her a copy. She then wanted bp checked. States it was hight at therapy today. They checked 3 times one time 187/90 and 160/100 after exercising. I took pt's bp and it was 148/88. She states she feel good and I told her it will be up some after exercising. She states she just started bp med and was told it would take up to 2 weeks to get in her system. She does have a follow up on the 31st with dr scott for bp check up. Told pt to check bp at home and write down numbers for her appt. Pt verbalized understanding.

## 2018-11-29 NOTE — Telephone Encounter (Signed)
Nurse visit was at 9:30 and it was changed to dr scott's scheduled on Wednesday at 9:30. Left message to return call to let pt know

## 2018-11-29 NOTE — Patient Instructions (Signed)
Results for orders placed or performed in visit on 11/29/18  POCT INR  Result Value Ref Range   INR 1.2 (A) 2.0 - 3.0

## 2018-11-29 NOTE — Telephone Encounter (Signed)
Patient notified and verbalized understanding. 

## 2018-11-29 NOTE — Progress Notes (Signed)
Daily Session Note  Patient Details  Name: EVERLEIGH COLCLASURE MRN: [id number] Date of Birth: [birth year] Referring Provider:     CARDIAC REHAB PHASE II ORIENTATION from 11/13/2018 in [place]  Referring Provider  [name]       Encounter Date: 11/29/2018  Check In: Session Check In - 11/29/18 0844      Check-In   Supervising physician immediately available to respond to emergencies  See telemetry face sheet for immediately available MD    Location  [facility]    Staff Present  Russella Dar, MS, EP, [facility], Exercise Physiologist;Debra Wynetta Emery, RN, [facility], Exercise Physiologist    Virtual Visit  No    Medication changes reported      No    Fall or balance concerns reported     No    Tobacco Cessation  No Change    Warm-up and Cool-down  Performed as group-led instruction    Resistance Training Performed  Yes    VAD Patient?  No    PAD/SET Patient?  No      Pain Assessment   Currently in Pain?  No/denies    Pain Score  0-No pain    Multiple Pain Sites  No       Capillary Blood Glucose: No results found for this or any previous visit (from the past 24 hour(s)).    Social History   Tobacco Use  Smoking Status Former Smoker  . Packs/day: 0.50  . Years: 10.00  . Pack years: 5.00  . Types: Cigarettes  Smokeless Tobacco Never Used    Goals Met:  Independence with exercise equipment Exercise tolerated well Personal goals reviewed No report of cardiac concerns or symptoms Strength training completed today  Goals Unmet:  Not Applicable  Comments: Check out: 0915   Dr. Kate Sable is Medical Director for [place] and Pulmonary Rehab.

## 2018-11-29 NOTE — Telephone Encounter (Signed)
Hannah Roy, I would suggest making this coming Wednesday's appointment a office visit appointment.  That way we can check her INR plus discuss her blood work plus check her blood pressure again thank you Feel free to have the front shift her to an office visit during an available slot that I have that morning then notify the patient

## 2018-12-02 ENCOUNTER — Encounter (HOSPITAL_COMMUNITY)
Admission: RE | Admit: 2018-12-02 | Discharge: 2018-12-02 | Disposition: A | Discharge status: Home / Self Care | Payer: Medicare Other | Source: Ambulatory Visit | Attending: Cardiovascular Disease | Admitting: Cardiovascular Disease

## 2018-12-02 ENCOUNTER — Ambulatory Visit: Payer: Medicare Other

## 2018-12-02 ENCOUNTER — Other Ambulatory Visit: Payer: Self-pay

## 2018-12-02 DIAGNOSIS — Z9889 Other specified postprocedural states: Secondary | ICD-10-CM | POA: Diagnosis not present

## 2018-12-02 DIAGNOSIS — Z953 Presence of xenogenic heart valve: Secondary | ICD-10-CM | POA: Diagnosis not present

## 2018-12-02 NOTE — Progress Notes (Signed)
Daily Session Note  Patient Details  Name: Hannah Roy MRN: [id number] Date of Birth: [birth year] Referring Provider:     CARDIAC REHAB PHASE II ORIENTATION from 11/13/2018 in [place]  Referring Provider  [name]       Encounter Date: 12/02/2018  Check In: Session Check In - 12/02/18 0805      Check-In   Supervising physician immediately available to respond to emergencies  See telemetry face sheet for immediately available MD    Location  [facility]    Staff Present  Russella Dar, MS, EP, [facility], Exercise Physiologist;Debra Wynetta Emery, RN, [facility], Exercise Physiologist    Virtual Visit  No    Medication changes reported      No    Fall or balance concerns reported     No    Tobacco Cessation  No Change    Warm-up and Cool-down  Performed as group-led instruction    Resistance Training Performed  Yes    VAD Patient?  No    PAD/SET Patient?  No      Pain Assessment   Currently in Pain?  No/denies    Pain Score  0-No pain    Multiple Pain Sites  No       Capillary Blood Glucose: No results found for this or any previous visit (from the past 24 hour(s)).    Social History   Tobacco Use  Smoking Status Former Smoker  . Packs/day: 0.50  . Years: 10.00  . Pack years: 5.00  . Types: Cigarettes  Smokeless Tobacco Never Used    Goals Met:  Independence with exercise equipment Exercise tolerated well No report of cardiac concerns or symptoms Strength training completed today  Goals Unmet:  Not Applicable  Comments: Pt able to follow exercise prescription today without complaint.  Will continue to monitor for progression. Check out 0915.   Dr. Kate Sable is Medical Director for [facility] Cardiac and Pulmonary Rehab.

## 2018-12-04 ENCOUNTER — Ambulatory Visit (INDEPENDENT_AMBULATORY_CARE_PROVIDER_SITE_OTHER): Payer: Medicare Other | Admitting: Family Medicine

## 2018-12-04 ENCOUNTER — Other Ambulatory Visit: Payer: Self-pay

## 2018-12-04 ENCOUNTER — Telehealth: Payer: Self-pay | Admitting: Family Medicine

## 2018-12-04 ENCOUNTER — Ambulatory Visit: Payer: Medicare Other

## 2018-12-04 ENCOUNTER — Ambulatory Visit: Payer: Self-pay | Admitting: *Deleted

## 2018-12-04 ENCOUNTER — Encounter (HOSPITAL_COMMUNITY)
Admission: RE | Admit: 2018-12-04 | Discharge: 2018-12-04 | Disposition: A | Discharge status: Home / Self Care | Payer: Medicare Other | Source: Ambulatory Visit | Attending: Cardiovascular Disease | Admitting: Cardiovascular Disease

## 2018-12-04 VITALS — BP 144/82 | Temp 97.9°F | Ht 61.0 in | Wt 116.0 lb

## 2018-12-04 DIAGNOSIS — E785 Hyperlipidemia, unspecified: Secondary | ICD-10-CM

## 2018-12-04 DIAGNOSIS — Z7901 Long term (current) use of anticoagulants: Secondary | ICD-10-CM | POA: Diagnosis not present

## 2018-12-04 DIAGNOSIS — I10 Essential (primary) hypertension: Secondary | ICD-10-CM

## 2018-12-04 DIAGNOSIS — Z79899 Other long term (current) drug therapy: Secondary | ICD-10-CM | POA: Diagnosis not present

## 2018-12-04 DIAGNOSIS — Z5181 Encounter for therapeutic drug level monitoring: Secondary | ICD-10-CM

## 2018-12-04 DIAGNOSIS — Z953 Presence of xenogenic heart valve: Secondary | ICD-10-CM

## 2018-12-04 DIAGNOSIS — Z9889 Other specified postprocedural states: Secondary | ICD-10-CM

## 2018-12-04 LAB — POCT INR
INR: 1.6 — AB (ref 2.0–3.0)
INR: 1.6 — AB (ref 2.0–3.0)

## 2018-12-04 MED ORDER — LOSARTAN POTASSIUM 50 MG PO TABS: 50.0000 mg | ORAL_TABLET | Freq: Every day | ORAL | 5 refills | Status: DC

## 2018-12-04 MED ORDER — ROSUVASTATIN CALCIUM 5 MG PO TABS: ORAL_TABLET | ORAL | 6 refills | Status: DC

## 2018-12-04 MED ORDER — WARFARIN SODIUM 2.5 MG PO TABS: ORAL_TABLET | ORAL | 1 refills | Status: DC

## 2018-12-04 NOTE — Progress Notes (Signed)
Daily Session Note  Patient Details  Name: RICHELL CORKER MRN: [id number] Date of Birth: 1940/12/08 Referring Provider:     CARDIAC REHAB PHASE II ORIENTATION from 11/13/2018 in [place]  Referring Provider  [name]       Encounter Date: 12/04/2018  Check In: Session Check In - 12/04/18 0815      Check-In   Supervising physician immediately available to respond to emergencies  See telemetry face sheet for immediately available MD    Location  [facility]    Staff Present  Russella Dar, MS, EP, [facility], Exercise Physiologist;Debra Wynetta Emery, RN, [facility], Exercise Physiologist    Virtual Visit  No    Medication changes reported      No    Fall or balance concerns reported     No    Tobacco Cessation  No Change    Warm-up and Cool-down  Performed as group-led instruction    Resistance Training Performed  Yes    VAD Patient?  No    PAD/SET Patient?  No      Pain Assessment   Currently in Pain?  No/denies    Pain Score  0-No pain    Multiple Pain Sites  No       Capillary Blood Glucose: No results found for this or any previous visit (from the past 24 hour(s)).    Social History   Tobacco Use  Smoking Status Former Smoker  . Packs/day: 0.50  . Years: 10.00  . Pack years: 5.00  . Types: Cigarettes  Smokeless Tobacco Never Used    Goals Met:  Independence with exercise equipment Exercise tolerated well No report of cardiac concerns or symptoms Strength training completed today  Goals Unmet:  Not Applicable  Comments: Pt able to follow exercise prescription today without complaint.  Will continue to monitor for progression. Check out 0915.   Dr. Kate Sable is Medical Director for [facility] Cardiac and Pulmonary Rehab.

## 2018-12-04 NOTE — Patient Instructions (Addendum)
Cholesterol Content in Foods Cholesterol is a waxy, fat-like substance that helps to carry fat in the blood. The body needs cholesterol in small amounts, but too much cholesterol can cause damage to the arteries and heart. Most people should eat less than 200 milligrams (mg) of cholesterol a day. Foods with cholesterol  Cholesterol is found in animal-based foods, such as meat, seafood, and dairy. Generally, low-fat dairy and lean meats have less cholesterol than full-fat dairy and fatty meats. The milligrams of cholesterol per serving (mg per serving) of common cholesterol-containing foods are listed below. Meat and other proteins  Egg - one large whole egg has 186 mg.  Veal shank - 4 oz has 141 mg.  Lean ground turkey (93% lean) - 4 oz has 118 mg.  Fat-trimmed lamb loin - 4 oz has 106 mg.  Lean ground beef (90% lean) - 4 oz has 100 mg.  Lobster - 3.5 oz has 90 mg.  Pork loin chops - 4 oz has 86 mg.  Canned salmon - 3.5 oz has 83 mg.  Fat-trimmed beef top loin - 4 oz has 78 mg.  Frankfurter - 1 frank (3.5 oz) has 77 mg.  Crab - 3.5 oz has 71 mg.  Roasted chicken without skin, white meat - 4 oz has 66 mg.  Light bologna - 2 oz has 45 mg.  Deli-cut turkey - 2 oz has 31 mg.  Canned tuna - 3.5 oz has 31 mg.  Bacon - 1 oz has 29 mg.  Oysters and mussels (raw) - 3.5 oz has 25 mg.  Mackerel - 1 oz has 22 mg.  Trout - 1 oz has 20 mg.  Pork sausage - 1 link (1 oz) has 17 mg.  Salmon - 1 oz has 16 mg.  Tilapia - 1 oz has 14 mg. Dairy  Soft-serve ice cream -  cup (4 oz) has 103 mg.  Whole-milk yogurt - 1 cup (8 oz) has 29 mg.  Cheddar cheese - 1 oz has 28 mg.  American cheese - 1 oz has 28 mg.  Whole milk - 1 cup (8 oz) has 23 mg.  2% milk - 1 cup (8 oz) has 18 mg.  Cream cheese - 1 tablespoon (Tbsp) has 15 mg.  Cottage cheese -  cup (4 oz) has 14 mg.  Low-fat (1%) milk - 1 cup (8 oz) has 10 mg.  Sour cream - 1 Tbsp has 8.5 mg.  Low-fat yogurt - 1 cup  (8 oz) has 8 mg.  Nonfat Greek yogurt - 1 cup (8 oz) has 7 mg.  Half-and-half cream - 1 Tbsp has 5 mg. Fats and oils  Cod liver oil - 1 tablespoon (Tbsp) has 82 mg.  Butter - 1 Tbsp has 15 mg.  Lard - 1 Tbsp has 14 mg.  Bacon grease - 1 Tbsp has 14 mg.  Mayonnaise - 1 Tbsp has 5-10 mg.  Margarine - 1 Tbsp has 3-10 mg. Exact amounts of cholesterol in these foods may vary depending on specific ingredients and brands. Foods without cholesterol Most plant-based foods do not have cholesterol unless you combine them with a food that has cholesterol. Foods without cholesterol include:  Grains and cereals.  Vegetables.  Fruits.  Vegetable oils, such as olive, canola, and sunflower oil.  Legumes, such as peas, beans, and lentils.  Nuts and seeds.  Egg whites. Summary  The body needs cholesterol in small amounts, but too much cholesterol can cause damage to the arteries and heart.    Most people should eat less than 200 milligrams (mg) of cholesterol a day. This information is not intended to replace advice given to you by your health care provider. Make sure you discuss any questions you have with your health care provider. Document Released: 11/21/2016 Document Revised: 03/09/2017 Document Reviewed: 11/21/2016 Elsevier Patient Education  2020 [place]. Preventing High Cholesterol Cholesterol is a white, waxy substance similar to fat that the human body needs to help build cells. The liver makes all the cholesterol that a person's body needs. Having high cholesterol (hypercholesterolemia) increases a person's risk for heart disease and stroke. Extra (excess) cholesterol comes from the food the person eats. High cholesterol can often be prevented with diet and lifestyle changes. If you already have high cholesterol, you can control it with diet and lifestyle changes and with medicine. How can high cholesterol affect me? If you have high cholesterol, deposits (plaques) may  build up on the walls of your arteries. The arteries are the blood vessels that carry blood away from your heart. Plaques make the arteries narrower and stiffer. This can limit or block blood flow and cause blood clots to form. Blood clots:  Are tiny balls of cells that form in your blood.  Can move to the heart or brain, causing a heart attack or stroke. Plaques in arteries greatly increase your risk for heart attack and stroke.Making diet and lifestyle changes can reduce your risk for these conditions that may threaten your life. What can increase my risk? This condition is more likely to develop in people who:  Eat foods that are high in saturated fat or cholesterol. Saturated fat is mostly found in: ? Foods that contain animal fat, such as red meat and some dairy products. ? Certain fatty foods made from plants, such as tropical oils.  Are overweight.  Are not getting enough exercise.  Have a family history of high cholesterol. What actions can I take to prevent this? Nutrition   Eat less saturated fat.  Avoid trans fats (partially hydrogenated oils). These are often found in margarine and in some baked goods, fried foods, and snacks bought in packages.  Avoid precooked or cured meat, such as sausages or meat loaves.  Avoid foods and drinks that have added sugars.  Eat more fruits, vegetables, and whole grains.  Choose healthy sources of protein, such as fish, poultry, lean cuts of red meat, beans, peas, lentils, and nuts.  Choose healthy sources of fat, such as: ? Nuts. ? Vegetable oils, especially olive oil. ? Fish that have healthy fats (omega-3 fatty acids), such as mackerel or salmon. The items listed above may not be a complete list of recommended foods and beverages. Contact a dietitian for more information. Lifestyle  Lose weight if you are overweight. Losing 5-10 lb (2.3-4.5 kg) can help prevent or control high cholesterol. It can also lower your risk for  diabetes and high blood pressure. Ask your health care provider to help you with a diet and exercise plan to lose weight safely.  Do not use any products that contain nicotine or tobacco, such as cigarettes, e-cigarettes, and chewing tobacco. If you need help quitting, ask your health care provider.  Limit your alcohol intake. ? Do not drink alcohol if:  Your health care provider tells you not to drink.  You are pregnant, may be pregnant, or are planning to become pregnant. ? If you drink alcohol:  Limit how much you use to:  0-1 drink a day for women.  0-2 drinks a day for men.  Be aware of how much alcohol is in your drink. In the [place] one drink equals one 12 oz bottle of beer (355 mL), one 5 oz glass of wine (148 mL), or one 1 oz glass of hard liquor (44 mL). Activity   Get enough exercise. Each week, do at least 150 minutes of exercise that takes a medium level of effort (moderate-intensity exercise). ? This is exercise that:  Makes your heart beat faster and makes you breathe harder than usual.  Allows you to still be able to talk. ? You could exercise in short sessions several times a day or longer sessions a few times a week. For example, on 5 days each week, you could walk fast or ride your bike 3 times a day for 10 minutes each time.  Do exercises as told by your health care provider. Medicines  In addition to diet and lifestyle changes, your health care provider may recommend medicines to help lower cholesterol. This may be a medicine to lower the amount of cholesterol your liver makes. You may need medicine if: ? Diet and lifestyle changes do not lower your cholesterol enough. ? You have high cholesterol and other risk factors for heart disease or stroke.  Take over-the-counter and prescription medicines only as told by your health care provider. General information  Manage your risk factors for high cholesterol. Talk with your health care provider about all your  risk factors and how to lower your risk.  Manage other conditions that you have, such as diabetes or high blood pressure (hypertension).  Have blood tests to check your cholesterol levels at regular points in time as told by your health care provider.  Keep all follow-up visits as told by your health care provider. This is important. Where to find more information  American Heart Association: www.heart.org  National Heart, Lung, and Blood Institute: https://wilson-eaton.com/ Summary  High cholesterol increases your risk for heart disease and stroke. By keeping your cholesterol level low, you can reduce your risk for these conditions.  High cholesterol can often be prevented with diet and lifestyle changes.  Work with your health care provider to manage your risk factors, and have your blood tested regularly. This information is not intended to replace advice given to you by your health care provider. Make sure you discuss any questions you have with your health care provider. Document Released: 04/11/2015 Document Revised: 07/19/2018 Document Reviewed: 12/04/2015 Elsevier Patient Education  [place].     Coumadin 2.5 mg tablets. Take 2 tablets on mondays and thursdays and one and a half tablets all other days. Recheck INR at coumadin clinic in [place] on September 1st

## 2018-12-04 NOTE — Telephone Encounter (Signed)
error 

## 2018-12-04 NOTE — Progress Notes (Signed)
   Subjective:    Patient ID: Hannah Roy, female    DOB: [birth year], 78 y.o.   MRN: [id number]  HPIBP running high.       Review of Systems     Objective:   Physical Exam        Assessment & Plan:

## 2018-12-04 NOTE — Progress Notes (Signed)
Subjective:    Patient ID: Hannah Roy, female    DOB: December 25, 1940, 78 y.o.   MRN: [id number]  HPI BP has been running high. Takes losartan potassium 25mg  one daily.  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  We need to adjust her INR today Coumadin clinic will overtake this she denies any rectal bleeding  Has history of GI bleed but no sign of any bleeding currently.  Warning signs were discussed what to watch for  Blood pressure subpar control denies chest tightness pressure pain shortness breath headaches.  We will need to go up on medication the importance of keeping it under good control discussed she is good about taking her medicine watching diet minimizing salt  Recent iron deficient anemia taking iron tablets but recent lab work looks good indicating she will not have to keep taking her iron tablets Results for orders placed or performed in visit on 12/04/18  POCT INR  Result Value Ref Range   INR 1.6 (A) 2.0 - 3.0     Review of Systems  Constitutional: Positive for fatigue. Negative for activity change and appetite change.  HENT: Negative for congestion and rhinorrhea.   Respiratory: Negative for cough and shortness of breath.   Cardiovascular: Negative for chest pain and leg swelling.  Gastrointestinal: Negative for abdominal pain, blood in stool and diarrhea.  Endocrine: Negative for polydipsia and polyphagia.  Skin: Negative for color change.  Neurological: Negative for dizziness and weakness.  Psychiatric/Behavioral: Negative for behavioral problems and confusion.       Objective:   Physical Exam Vitals signs reviewed.  Constitutional:       General: She is not in acute distress. HENT:     Head: Normocephalic and atraumatic.  Eyes:     General:        Right eye: No discharge.        Left eye: No discharge.  Neck:     Trachea: No tracheal deviation.  Cardiovascular:     Rate and Rhythm: Normal rate and regular rhythm.     Heart sounds: Normal heart sounds. No murmur.  Pulmonary:     Effort: Pulmonary effort is normal. No respiratory distress.     Breath sounds: Normal breath sounds.  Lymphadenopathy:     Cervical: No cervical adenopathy.  Skin:    General: Skin is warm and dry.  Neurological:     Mental Status: She is alert.     Coordination: Coordination normal.  Psychiatric:        Behavior: Behavior normal.           Assessment & Plan:  1. Encounter for current long-term use of anticoagulants Patient on Coumadin Had valve replacement INR 1.6 Adjusted 2.5 mg tablet take 2 on Mondays and Thursdays 1-1/2 on all other days Future INR will be through the Coumadin clinic - POCT INR  2. Essential hypertension, benign Blood pressure checked several times while in the office was higher than what we would like to see got around 150/94 recommend increasing losartan 50 mg daily take it in the evening follow-up within 6 weeks to recheck  3. Hyperlipidemia, unspecified hyperlipidemia type LDL elevated increased risk of heart disease recommend Crestor  Mondays and Fridays if having any side effects with it to notify us check lipid profile 6 weeks - Lipid panel  4. High risk medication use Check liver profile 6 weeks because of starting cholesterol medicine - Hepatic function panel  5. Encounter for therapeutic drug monitoring INR monitored today  6. Atrial fibrillation with RVR (HCC) History of atrial fibrillation with RVR but actually doing okay currently monitor closely  Follow-up with cardiology mid-September  25 minutes was spent with the patient.  This statement verifies that 25 minutes was indeed  spent with the patient.  More than 50% of this visit-total duration of the visit-was spent in counseling and coordination of care. The issues that the patient came in for today as reflected in the diagnosis (s) please refer to documentation for further details.  Her iron level is good I recommend stopping iron tablets hemoglobin looks good.

## 2018-12-06 ENCOUNTER — Other Ambulatory Visit: Payer: Self-pay

## 2018-12-06 ENCOUNTER — Encounter (HOSPITAL_COMMUNITY)
Admission: RE | Admit: 2018-12-06 | Discharge: 2018-12-06 | Disposition: A | Discharge status: Home / Self Care | Payer: Medicare Other | Source: Ambulatory Visit | Attending: Cardiovascular Disease | Admitting: Cardiovascular Disease

## 2018-12-06 DIAGNOSIS — Z953 Presence of xenogenic heart valve: Secondary | ICD-10-CM | POA: Diagnosis not present

## 2018-12-06 DIAGNOSIS — Z9889 Other specified postprocedural states: Secondary | ICD-10-CM | POA: Diagnosis not present

## 2018-12-06 NOTE — Progress Notes (Signed)
Daily Session Note  Patient Details  Name: IMANI FIEBELKORN MRN: [id number] Date of Birth: [birth year] Referring Provider:     CARDIAC REHAB PHASE II ORIENTATION from 11/13/2018 in [place]  Referring Provider  [name]       Encounter Date: 12/06/2018  Check In: Session Check In - 12/06/18 0815      Check-In   Supervising physician immediately available to respond to emergencies  See telemetry face sheet for immediately available MD    Location  [facility]    Staff Present  Russella Dar, MS, EP, [facility], Exercise Physiologist;Debra Wynetta Emery, RN, [facility], Exercise Physiologist    Virtual Visit  No    Medication changes reported      No    Fall or balance concerns reported     No    Tobacco Cessation  No Change    Warm-up and Cool-down  Performed as group-led instruction    Resistance Training Performed  Yes    VAD Patient?  No    PAD/SET Patient?  No      Pain Assessment   Currently in Pain?  No/denies    Pain Score  0-No pain    Multiple Pain Sites  No       Capillary Blood Glucose: No results found for this or any previous visit (from the past 24 hour(s)).    Social History   Tobacco Use  Smoking Status Former Smoker  . Packs/day: 0.50  . Years: 10.00  . Pack years: 5.00  . Types: Cigarettes  Smokeless Tobacco Never Used    Goals Met:  Independence with exercise equipment Exercise tolerated well No report of cardiac concerns or symptoms Strength training completed today  Goals Unmet:  Not Applicable  Comments: Pt able to follow exercise prescription today without complaint.  Will continue to monitor for progression. Checkout [id number].   Dr. Kate Sable is Medical Director for [facility].

## 2018-12-09 ENCOUNTER — Other Ambulatory Visit: Payer: Self-pay

## 2018-12-09 ENCOUNTER — Encounter (HOSPITAL_COMMUNITY)
Admission: RE | Admit: 2018-12-09 | Discharge: 2018-12-09 | Disposition: A | Discharge status: Home / Self Care | Payer: Medicare Other | Source: Ambulatory Visit | Attending: Cardiovascular Disease | Admitting: Cardiovascular Disease

## 2018-12-09 ENCOUNTER — Ambulatory Visit: Payer: Medicare Other | Admitting: Family Medicine

## 2018-12-09 DIAGNOSIS — Z953 Presence of xenogenic heart valve: Secondary | ICD-10-CM | POA: Diagnosis not present

## 2018-12-09 DIAGNOSIS — Z9889 Other specified postprocedural states: Secondary | ICD-10-CM

## 2018-12-09 NOTE — Progress Notes (Signed)
Cardiac Individual Treatment Plan  Patient Details  Name: Hannah Roy MRN: [id number] Date of Birth: [birth year] Referring Provider:     CARDIAC REHAB PHASE II ORIENTATION from 11/13/2018 in [place]  Referring Provider  [name]       Initial Encounter Date:    CARDIAC REHAB PHASE II ORIENTATION from 11/13/2018 in [place]  Date  11/13/18      Visit Diagnosis: S/P aortic valve replacement with bioprosthetic valve  S/P tricuspid valve repair  Patient's Home Medications on Admission:  Current Outpatient Medications:  .  FEROCON capsule, TAKE ONE CAPSULE BY MOUTH DAILY WITH BREAKFAST, Disp: 30 capsule, Rfl: 2 .  losartan (COZAAR) 50 MG tablet, Take 1 tablet (50 mg total) by mouth daily., Disp: 30 tablet, Rfl: 5 .  OVER THE COUNTER MEDICATION, Stool softner, Disp: , Rfl:  .  pantoprazole (PROTONIX) 40 MG tablet, Take 1 tablet (40 mg total) by mouth daily before breakfast., Disp: 30 tablet, Rfl: 11 .  rosuvastatin (CRESTOR) 5 MG tablet, Take one tablet on Monday and fridays., Disp: 8 tablet, Rfl: 6 .  warfarin (COUMADIN) 2.5 MG tablet, Take as directed 1.5 or 2 tablets daily follow current instructions, Disp: 60 tablet, Rfl: 1  Past Medical History: Past Medical History:  Diagnosis Date  . Aortic atherosclerosis ([place])   . Aortic insufficiency, rheumatic   . 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 Atrial fibrillation ([place]) 05/10/2018  . Colon polyps   . Essential hypertension, benign   . History of seasonal allergies   . Hypertension   . Hypothyroidism   . Mitral stenosis with regurgitation, rheumatic   . Osteopenia 2006  . Persistent atrial fibrillation   . 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   21 mm [facility] Ease stented bovine pericardial tissue valve  . S/P ascending aortic aneurysm repair 09/05/2018   28 mm Hemashield platinum supracoronary straight graft  . S/P [place] operation for atrial  fibrillation 09/05/2018   Complete bilateral atrial lesion set using bipolar radiofrequency and cryothermy ablation with clipping of LA appendage  . 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   29 mm [facility] Mitral stented bovine pericardial tissue valve  . 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 Tricuspid regurgitation     Tobacco Use: Social History   Tobacco Use  Smoking Status Former Smoker  . Packs/day: 0.50  . Years: 10.00  . Pack years: 5.00  . Types: Cigarettes  Smokeless Tobacco Never Used    Labs: Recent Review Flowsheet Data    Labs for ITP Cardiac and Pulmonary Rehab Latest Ref Rng & Units 09/05/2018 09/05/2018 09/06/2018 09/06/2018 11/27/2018   Cholestrol 100 - 199 mg/dL - - - - 197   LDLCALC 0 - 99 mg/dL - - - - 125(H)   HDL >39 mg/dL - - - - 53   Trlycerides 0 - 149 mg/dL - - - - 95   Hemoglobin A1c 4.8 - 5.6 % - - - - -   PHART 7.350 - 7.450 7.365 - 7.357 7.367 -   PCO2ART 32.0 - 48.0 mmHg 39.0 - 37.6 38.9 -   HCO3 20.0 - 28.0 mmol/L 22.5 - 21.0 22.3 -   TCO2 22 - 32 mmol/L 24 22 22 23  -   ACIDBASEDEF 0.0 - 2.0 mmol/L 3.0(H) - 4.0(H) 3.0(H) -   O2SAT % 100.0 - 100.0 99.0 -  Capillary Blood Glucose: Lab Results  Component Value Date   GLUCAP 89 09/09/2018   GLUCAP 93 09/08/2018   GLUCAP 110 (H) 09/08/2018   GLUCAP 121 (H) 09/08/2018   GLUCAP 124 (H) 09/08/2018     Exercise Target Goals: Exercise Program Goal: Individual exercise prescription set using results from initial 6 min walk test and THRR while considering  patient's activity barriers and safety.   Exercise Prescription Goal: Starting with aerobic activity 30 plus minutes a day, 3 days per week for initial exercise prescription. Provide home exercise prescription and guidelines that participant acknowledges understanding prior to discharge.  Activity Barriers & Risk Stratification: Activity Barriers & Cardiac Risk  Stratification - 11/13/18 1346      Activity Barriers & Cardiac Risk Stratification   Activity Barriers  None    Cardiac Risk Stratification  High       6 Minute Walk: 6 Minute Walk    Row Name 11/13/18 1344         6 Minute Walk   Phase  Initial     Distance  1200 feet     Walk Time  6 minutes     # of Rest Breaks  0     MPH  2.27     METS  2.74     RPE  9     Perceived Dyspnea   9     VO2 Peak  9.4     Symptoms  No     Resting HR  75 bpm     Resting BP  160/84     Resting Oxygen Saturation   98 %     Exercise Oxygen Saturation  during 6 min walk  96 %     Max Ex. HR  95 bpm     Max Ex. BP  166/80     2 Minute Post BP  148/80        Oxygen Initial Assessment:   Oxygen Re-Evaluation:   Oxygen Discharge (Final Oxygen Re-Evaluation):   Initial Exercise Prescription: Initial Exercise Prescription - 11/13/18 1300      Date of Initial Exercise RX and Referring Provider   Date  11/13/18    Referring Provider  [facility]     Expected Discharge Date  02/13/19      Treadmill   MPH  1.3    Grade  0    Minutes  17    METs  1.99      NuStep   Level  1    SPM  60    Minutes  17    METs  1.6      Prescription Details   Frequency (times per week)  3    Duration  Progress to 30 minutes of continuous aerobic without signs/symptoms of physical distress      Intensity   THRR 40-80% of Max Heartrate  [phone]    Ratings of Perceived Exertion  11-13    Perceived Dyspnea  0-4      Progression   Progression  Continue to progress workloads to maintain intensity without signs/symptoms of physical distress.      Resistance Training   Training Prescription  Yes    Weight  1    Reps  10-15       Perform Capillary Blood Glucose checks as needed.  Exercise Prescription Changes:  Exercise Prescription Changes    Row Name 11/13/18 1300 12/05/18 1200           Response to  Exercise   Blood Pressure (Admit)  160/84  168/84      Blood Pressure (Exercise)   166/80  190/102      Blood Pressure (Exit)  148/80  152/80      Heart Rate (Admit)  75 bpm  84 bpm      Heart Rate (Exercise)  95 bpm  107 bpm      Heart Rate (Exit)  85 bpm  93 bpm      Oxygen Saturation (Admit)  98 %  -      Oxygen Saturation (Exercise)  96 %  -      Oxygen Saturation (Exit)  99 %  -      Rating of Perceived Exertion (Exercise)  9  -      Perceived Dyspnea (Exercise)  7  -      Symptoms  none  -      Comments  6 minute walk tes t  first two weeks of exercise       Duration  Progress to 30 minutes of  aerobic without signs/symptoms of physical distress  Continue with 30 min of aerobic exercise without signs/symptoms of physical distress.      Intensity  THRR New [phone]  THRR unchanged        Resistance Training   Training Prescription  -  Yes      Weight  -  2      Reps  -  10-15        Treadmill   MPH  -  1.6      Grade  -  0      Minutes  -  17      METs  -  2.22        NuStep   Level  -  2      SPM  -  105      Minutes  -  22      METs  -  2.3        Home Exercise Plan   Plans to continue exercise at  Home (comment)  Home (comment)      Frequency  Add 2 additional days to program exercise sessions.  Add 2 additional days to program exercise sessions.      Initial Home Exercises Provided  11/13/18  11/13/18         Exercise Comments:  Exercise Comments    Row Name 11/18/18 1413 12/05/18 1229 12/09/18 1359       Exercise Comments  Pt. is new to the program. She has attended 2 sessions so far and has tolerated the exercise well.  Pt. has done extremely well in CR. She comes everyday with the best attitude and is ready to exercise. She enjoys being here and has been tolerating the exercise well.  Pt. is regularly attending Cardiac Rehab. She Come ready to work and is eager to increase her work loads. Her BP is finally starting to stablize so we were able to bump her to 1.9 MPH on the TM.        Exercise Goals and Review:  Exercise Goals    Row  Name 11/13/18 1348             Exercise Goals   Increase Physical Activity  Yes       Intervention  Provide advice, education, support and counseling about physical activity/exercise needs.;Develop an individualized exercise prescription for aerobic and resistive training based on initial evaluation  findings, risk stratification, comorbidities and participant's personal goals.       Expected Outcomes  Short Term: Attend rehab on a regular basis to increase amount of physical activity.;Long Term: Add in home exercise to make exercise part of routine and to increase amount of physical activity.;Long Term: Exercising regularly at least 3-5 days a week.       Increase Strength and Stamina  Yes       Intervention  Provide advice, education, support and counseling about physical activity/exercise needs.;Develop an individualized exercise prescription for aerobic and resistive training based on initial evaluation findings, risk stratification, comorbidities and participant's personal goals.       Expected Outcomes  Short Term: Increase workloads from initial exercise prescription for resistance, speed, and METs.;Short Term: Perform resistance training exercises routinely during rehab and add in resistance training at home;Long Term: Improve cardiorespiratory fitness, muscular endurance and strength as measured by increased METs and functional capacity (6MWT)       Able to understand and use rate of perceived exertion (RPE) scale  Yes       Intervention  Provide education and explanation on how to use RPE scale       Expected Outcomes  Short Term: Able to use RPE daily in rehab to express subjective intensity level;Long Term:  Able to use RPE to guide intensity level when exercising independently       Knowledge and understanding of Target Heart Rate Range (THRR)  Yes       Intervention  Provide education and explanation of THRR including how the numbers were predicted and where they are located for  reference       Expected Outcomes  Short Term: Able to state/look up THRR;Long Term: Able to use THRR to govern intensity when exercising independently;Short Term: Able to use daily as guideline for intensity in rehab       Able to check pulse independently  Yes       Intervention  Provide education and demonstration on how to check pulse in carotid and radial arteries.;Review the importance of being able to check your own pulse for safety during independent exercise       Expected Outcomes  Short Term: Able to explain why pulse checking is important during independent exercise;Long Term: Able to check pulse independently and accurately       Understanding of Exercise Prescription  Yes       Intervention  Provide education, explanation, and written materials on patient's individual exercise prescription       Expected Outcomes  Short Term: Able to explain program exercise prescription;Long Term: Able to explain home exercise prescription to exercise independently          Exercise Goals Re-Evaluation : Exercise Goals Re-Evaluation    [place] Name 11/18/18 1413 12/09/18 1358           Exercise Goal Re-Evaluation   Exercise Goals Review  Increase Physical Activity;Increase Strength and Stamina;Able to understand and use rate of perceived exertion (RPE) scale;Knowledge and understanding of Target Heart Rate Range (THRR);Able to check pulse independently;Understanding of Exercise Prescription  Increase Physical Activity;Increase Strength and Stamina;Able to understand and use rate of perceived exertion (RPE) scale;Knowledge and understanding of Target Heart Rate Range (THRR);Able to check pulse independently;Understanding of Exercise Prescription      Comments  Pt. is following exercise prescription to help her reach her goals.  Pt. has attended regularly. She enjoys coming to rehab and is working to increase her strength  Expected Outcomes  Short: increase overall activity level Long: develop  exercise routine for home  Short: return to ADLs Long: get back to pre-surgery strength          Discharge Exercise Prescription (Final Exercise Prescription Changes): Exercise Prescription Changes - 12/05/18 1200      Response to Exercise   Blood Pressure (Admit)  168/84    Blood Pressure (Exercise)  190/102    Blood Pressure (Exit)  152/80    Heart Rate (Admit)  84 bpm    Heart Rate (Exercise)  107 bpm    Heart Rate (Exit)  93 bpm    Comments  first two weeks of exercise     Duration  Continue with 30 min of aerobic exercise without signs/symptoms of physical distress.    Intensity  THRR unchanged      Resistance Training   Training Prescription  Yes    Weight  2    Reps  10-15      Treadmill   MPH  1.6    Grade  0    Minutes  17    METs  2.22      NuStep   Level  2    SPM  105    Minutes  22    METs  2.3      Home Exercise Plan   Plans to continue exercise at  Home (comment)    Frequency  Add 2 additional days to program exercise sessions.    Initial Home Exercises Provided  11/13/18       Nutrition:  Target Goals: Understanding of nutrition guidelines, daily intake of sodium 1500mg , cholesterol 200mg , calories 30% from fat and 7% or less from saturated fats, daily to have 5 or more servings of fruits and vegetables.  Biometrics: Pre Biometrics - 11/13/18 1348      Pre Biometrics   Height  5\' 1"  (1.549 m)    Waist Circumference  28 inches    Hip Circumference  34 inches    Waist to Hip Ratio  0.82 %    Triceps Skinfold  8 mm    % Body Fat  28.7 %    Grip Strength  5.7 kg    Single Leg Stand  37 seconds        Nutrition Therapy Plan and Nutrition Goals: Nutrition Therapy & Goals - 12/09/18 1446      Personal Nutrition Goals   Comments  We have not resumed out RD class post COVID-19. We are working on resuming classes. She says she is eating heart healthy. Will continue to monitor.      Intervention Plan   Intervention  Nutrition handout(s)  given to patient.       Nutrition Assessments: Nutrition Assessments - 11/13/18 1429      MEDFICTS Scores   Pre Score  21       Nutrition Goals Re-Evaluation:   Nutrition Goals Discharge (Final Nutrition Goals Re-Evaluation):   Psychosocial: Target Goals: Acknowledge presence or absence of significant depression and/or stress, maximize coping skills, provide positive support system. Participant is able to verbalize types and ability to use techniques and skills needed for reducing stress and depression.  Initial Review & Psychosocial Screening: Initial Psych Review & Screening - 11/13/18 1429      Initial Review   Current issues with  None Identified      Family Dynamics   Good Support System?  Yes      Barriers   Psychosocial  barriers to participate in program  There are no identifiable barriers or psychosocial needs.      Screening Interventions   Interventions  Encouraged to exercise;Provide feedback about the scores to participant    Expected Outcomes  Long Term goal: The participant improves quality of Life and PHQ9 Scores as seen by post scores and/or verbalization of changes       Quality of Life Scores: Quality of Life - 11/13/18 1349      Quality of Life   Select  Quality of Life      Quality of Life Scores   Health/Function Pre  21.5 %    Socioeconomic Pre  24 %    Psych/Spiritual Pre  24 %    Family Pre  22.8 %    GLOBAL Pre  22.72 %      Scores of 19 and below usually indicate a poorer quality of life in these areas.  A difference of  2-3 points is a clinically meaningful difference.  A difference of 2-3 points in the total score of the Quality of Life Index has been associated with significant improvement in overall quality of life, self-image, physical symptoms, and general health in studies assessing change in quality of life.  PHQ-9: Recent Review Flowsheet Data    Depression screen [facility] 2/9 11/13/2018 05/02/2017 01/12/2015 10/17/2013   Decreased  Interest 0 0 0 0   Down, Depressed, Hopeless 0 0 0 0   PHQ - 2 Score 0 0 0 0   Altered sleeping 1 1 - -   Tired, decreased energy 1 2 - -   Change in appetite 0 0 - -   Feeling bad or failure about yourself  0 0 - -   Trouble concentrating 0 0 - -   Moving slowly or fidgety/restless 0 1 - -   Suicidal thoughts 0 0 - -   PHQ-9 Score 2 4 - -   Difficult doing work/chores Not difficult at all - - -     Interpretation of Total Score  Total Score Depression Severity:  1-4 = Minimal depression, 5-9 = Mild depression, 10-14 = Moderate depression, 15-19 = Moderately severe depression, 20-27 = Severe depression   Psychosocial Evaluation and Intervention: Psychosocial Evaluation - 11/13/18 1430      Psychosocial Evaluation & Interventions   Interventions  Encouraged to exercise with the program and follow exercise prescription;Stress management education;Relaxation education    Comments  Patient has no psychosocial issues identified. Her initial QOL score was 22.72 and her PHQ-9 score was 2.    Expected Outcomes  Patient will have no psychosoical issues identified at discharge.    Continue Psychosocial Services   No Follow up required       Psychosocial Re-Evaluation: Psychosocial Re-Evaluation    [place] Name 12/09/18 1451             Psychosocial Re-Evaluation   Current issues with  None Identified       Comments  Patient's initial QOL socre was 22.72 and her PHQ-9 score was 2 with no psychosocial issues identified.       Expected Outcomes  Patient will have no psychosocial issues identified at discharge.       Interventions  Stress management education;Encouraged to attend Cardiac Rehabilitation for the exercise;Relaxation education       Continue Psychosocial Services   No Follow up required          Psychosocial Discharge (Final Psychosocial Re-Evaluation): Psychosocial Re-Evaluation - 12/09/18 1451  Psychosocial Re-Evaluation   Current issues with  None Identified     Comments  Patient's initial QOL socre was 22.72 and her PHQ-9 score was 2 with no psychosocial issues identified.    Expected Outcomes  Patient will have no psychosocial issues identified at discharge.    Interventions  Stress management education;Encouraged to attend Cardiac Rehabilitation for the exercise;Relaxation education    Continue Psychosocial Services   No Follow up required       Vocational Rehabilitation: Provide vocational rehab assistance to qualifying candidates.   Vocational Rehab Evaluation & Intervention: Vocational Rehab - 11/13/18 1428      Initial Vocational Rehab Evaluation & Intervention   Assessment shows need for Vocational Rehabilitation  No      Vocational Rehab Re-Evaulation   Comments  Patient is retired and not interested in returning to work.       Education: Education Goals: Education classes will be provided on a weekly basis, covering required topics. Participant will state understanding/return demonstration of topics presented.  Learning Barriers/Preferences: Learning Barriers/Preferences - 11/13/18 1429      Learning Barriers/Preferences   Learning Barriers  None    Learning Preferences  Skilled Demonstration       Education Topics: Hypertension, Hypertension Reduction -Define heart disease and high blood pressure. Discus how high blood pressure affects the body and ways to reduce high blood pressure.   Exercise and Your Heart -Discuss why it is important to exercise, the FITT principles of exercise, normal and abnormal responses to exercise, and how to exercise safely.   Angina -Discuss definition of angina, causes of angina, treatment of angina, and how to decrease risk of having angina.   Cardiac Medications -Review what the following cardiac medications are used for, how they affect the body, and side effects that may occur when taking the medications.  Medications include Aspirin, Beta blockers, calcium channel blockers, ACE  Inhibitors, angiotensin receptor blockers, diuretics, digoxin, and antihyperlipidemics.   Congestive Heart Failure -Discuss the definition of CHF, how to live with CHF, the signs and symptoms of CHF, and how keep track of weight and sodium intake.   Heart Disease and Intimacy -Discus the effect sexual activity has on the heart, how changes occur during intimacy as we age, and safety during sexual activity.   CARDIAC REHAB PHASE II EXERCISE from 12/04/2018 in [place]  Date  11/20/18  Educator  D. Coad  Instruction Review Code  2- Demonstrated Understanding      Smoking Cessation / COPD -Discuss different methods to quit smoking, the health benefits of quitting smoking, and the definition of COPD.   CARDIAC REHAB PHASE II EXERCISE from 12/04/2018 in [place]  Date  11/27/18  Educator  DJ  Instruction Review Code  2- Demonstrated Understanding      Nutrition I: Fats -Discuss the types of cholesterol, what cholesterol does to the heart, and how cholesterol levels can be controlled.   CARDIAC REHAB PHASE II EXERCISE from 12/04/2018 in [place]  Date  12/04/18  Educator  DC  Instruction Review Code  2- Demonstrated Understanding      Nutrition II: Labels -Discuss the different components of food labels and how to read food label   Heart Parts/Heart Disease and PAD -Discuss the anatomy of the heart, the pathway of blood circulation through the heart, and these are affected by heart disease.   Stress I: Signs and Symptoms -Discuss the causes of stress, how stress may lead  to anxiety and depression, and ways to limit stress.   Stress II: Relaxation -Discuss different types of relaxation techniques to limit stress.   Warning Signs of Stroke / TIA -Discuss definition of a stroke, what the signs and symptoms are of a stroke, and how to identify when someone is having stroke.   Knowledge Questionnaire  Score: Knowledge Questionnaire Score - 11/13/18 1428      Knowledge Questionnaire Score   Pre Score  23/24       Core Components/Risk Factors/Patient Goals at Admission: Personal Goals and Risk Factors at Admission - 11/13/18 1433      Core Components/Risk Factors/Patient Goals on Admission    Weight Management  Weight Maintenance    Personal Goal Other  Yes    Personal Goal  Get back to normal ADL's; Do what she did before her surgery.    Intervention  Patient will attend CR 3 days/week and will supplement with exercise at home 2 days/week.    Expected Outcomes  Patient will meet her personal goals.       Core Components/Risk Factors/Patient Goals Review:  Goals and Risk Factor Review    Row Name 12/09/18 1446             Core Components/Risk Factors/Patient Goals Review   Personal Goals Review  Weight Management/Obesity;Hypertension Be able to do ADL's/Get back to where she was before surgery.       Review  Patient has completed 11 sessions losing 6 lbs since her orientation visit. She is doing well in the program with progressions. She has been evaluated by her pcp 2 times for hypertension. He started her on Losartan at 25 mg daily and increased the dose 2 weeks later to 50 mg daily. Her blood pressure today was 140/84 initially and she reports 120/84 before she left home. She reports no side effects from the medication. She says she is feeling better and stronger and is able to do more of her activities. She enjoys coming to the program and feels she is meeting her goals.       Expected Outcomes  Patient will continue to attend sessions and complete the program meeting her personal goals.          Core Components/Risk Factors/Patient Goals at Discharge (Final Review):  Goals and Risk Factor Review - 12/09/18 1446      Core Components/Risk Factors/Patient Goals Review   Personal Goals Review  Weight Management/Obesity;Hypertension   Be able to do ADL's/Get back to where she  was before surgery.   Review  Patient has completed 11 sessions losing 6 lbs since her orientation visit. She is doing well in the program with progressions. She has been evaluated by her pcp 2 times for hypertension. He started her on Losartan at 25 mg daily and increased the dose 2 weeks later to 50 mg daily. Her blood pressure today was 140/84 initially and she reports 120/84 before she left home. She reports no side effects from the medication. She says she is feeling better and stronger and is able to do more of her activities. She enjoys coming to the program and feels she is meeting her goals.    Expected Outcomes  Patient will continue to attend sessions and complete the program meeting her personal goals.       ITP Comments: ITP Comments    Row Name 11/18/18 1436           ITP Comments  Patient is new to the program.  She has completed 2 sessions. Will continue to monitor for progress.          Comments: ITP REVIEW Patient is doing well in the program. Will continue to monitor for progress.

## 2018-12-09 NOTE — Progress Notes (Signed)
Daily Session Note  Patient Details  Name: Hannah Roy MRN: [id number] Date of Birth: [birth year] Referring Provider:     CARDIAC REHAB PHASE II ORIENTATION from 11/13/2018 in [place]  Referring Provider  [name]       Encounter Date: 12/09/2018  Check In: Session Check In - 12/09/18 1165      Check-In   Supervising physician immediately available to respond to emergencies  See telemetry face sheet for immediately available MD    Location  [facility]    Staff Present  Russella Dar, MS, EP, [facility], Exercise Physiologist;Debra Wynetta Emery, RN, Cory Munch, Exercise Physiologist    Virtual Visit  No    Medication changes reported      No    Fall or balance concerns reported     No    Tobacco Cessation  No Change    Warm-up and Cool-down  Performed as group-led instruction    Resistance Training Performed  Yes    VAD Patient?  No    PAD/SET Patient?  No      Pain Assessment   Currently in Pain?  No/denies    Pain Score  0-No pain    Multiple Pain Sites  No       Capillary Blood Glucose: No results found for this or any previous visit (from the past 24 hour(s)).    Social History   Tobacco Use  Smoking Status Former Smoker  . Packs/day: 0.50  . Years: 10.00  . Pack years: 5.00  . Types: Cigarettes  Smokeless Tobacco Never Used    Goals Met:  Independence with exercise equipment Exercise tolerated well No report of cardiac concerns or symptoms Strength training completed today  Goals Unmet:  Not Applicable  Comments: Pt able to follow exercise prescription today without complaint.  Will continue to monitor for progression. Check out 0915.   Dr. Kate Sable is Medical Director for [facility] Cardiac and Pulmonary Rehab.

## 2018-12-10 ENCOUNTER — Other Ambulatory Visit: Payer: Self-pay

## 2018-12-10 ENCOUNTER — Ambulatory Visit (INDEPENDENT_AMBULATORY_CARE_PROVIDER_SITE_OTHER): Payer: Medicare Other | Admitting: *Deleted

## 2018-12-10 DIAGNOSIS — I4819 Other persistent atrial fibrillation: Secondary | ICD-10-CM

## 2018-12-10 DIAGNOSIS — Z953 Presence of xenogenic heart valve: Secondary | ICD-10-CM

## 2018-12-10 DIAGNOSIS — Z5181 Encounter for therapeutic drug level monitoring: Secondary | ICD-10-CM | POA: Diagnosis not present

## 2018-12-10 LAB — POCT INR: INR: 2.5 (ref 2.0–3.0)

## 2018-12-10 NOTE — Patient Instructions (Signed)
Coumadin 2.5 mg tablets.  INR up from 1.6 to 2.5 in 6 days Decrease dose to 1 1/2 tablets daily. Recheck in 1 wk

## 2018-12-11 ENCOUNTER — Encounter (HOSPITAL_COMMUNITY)
Admission: RE | Admit: 2018-12-11 | Discharge: 2018-12-11 | Disposition: A | Discharge status: Home / Self Care | Payer: Medicare Other | Source: Ambulatory Visit | Attending: Cardiovascular Disease | Admitting: Cardiovascular Disease

## 2018-12-11 DIAGNOSIS — Z953 Presence of xenogenic heart valve: Secondary | ICD-10-CM | POA: Diagnosis not present

## 2018-12-11 DIAGNOSIS — Z9889 Other specified postprocedural states: Secondary | ICD-10-CM | POA: Diagnosis not present

## 2018-12-11 NOTE — Progress Notes (Signed)
Daily Session Note  Patient Details  Name: Hannah Roy MRN: [id number] Date of Birth: 1940-04-27 Referring Provider:     CARDIAC REHAB PHASE II ORIENTATION from 11/13/2018 in [place]  Referring Provider  [name]       Encounter Date: 12/11/2018  Check In: Session Check In - 12/11/18 0815      Check-In   Supervising physician immediately available to respond to emergencies  See telemetry face sheet for immediately available MD    Location  [facility]    Staff Present  Russella Dar, MS, EP, [facility], Exercise Physiologist;Debra Wynetta Emery, RN, Cory Munch, Exercise Physiologist    Virtual Visit  No    Medication changes reported      No    Fall or balance concerns reported     No    Tobacco Cessation  No Change    Warm-up and Cool-down  Performed as group-led instruction    Resistance Training Performed  Yes    VAD Patient?  No    PAD/SET Patient?  No      Pain Assessment   Currently in Pain?  No/denies    Pain Score  0-No pain    Multiple Pain Sites  No       Capillary Blood Glucose: Results for orders placed or performed in visit on 12/10/18 (from the past 24 hour(s))  POCT INR     Status: Normal   Collection Time: 12/10/18 12:11 PM  Result Value Ref Range   INR 2.5 2.0 - 3.0      Social History   Tobacco Use  Smoking Status Former Smoker  . Packs/day: 0.50  . Years: 10.00  . Pack years: 5.00  . Types: Cigarettes  Smokeless Tobacco Never Used    Goals Met:  Independence with exercise equipment Exercise tolerated well No report of cardiac concerns or symptoms Strength training completed today  Goals Unmet:  Not Applicable  Comments: Pt able to follow exercise prescription today without complaint.  Will continue to monitor for progression. Check out 0915.   Dr. Kate Sable is Medical Director for [facility] Cardiac and Pulmonary Rehab.

## 2018-12-13 ENCOUNTER — Other Ambulatory Visit: Payer: Self-pay

## 2018-12-13 ENCOUNTER — Encounter (HOSPITAL_COMMUNITY)
Admission: RE | Admit: 2018-12-13 | Discharge: 2018-12-13 | Disposition: A | Discharge status: Home / Self Care | Payer: Medicare Other | Source: Ambulatory Visit | Attending: Cardiovascular Disease | Admitting: Cardiovascular Disease

## 2018-12-13 DIAGNOSIS — Z9889 Other specified postprocedural states: Secondary | ICD-10-CM | POA: Diagnosis not present

## 2018-12-13 DIAGNOSIS — Z953 Presence of xenogenic heart valve: Secondary | ICD-10-CM | POA: Diagnosis not present

## 2018-12-13 NOTE — Progress Notes (Signed)
Daily Session Note  Patient Details  Name: Hannah Roy MRN: [id number] Date of Birth: 1940-11-29 Referring Provider:     CARDIAC REHAB PHASE II ORIENTATION from 11/13/2018 in [place]  Referring Provider  [name]       Encounter Date: 12/13/2018  Check In: Session Check In - 12/13/18 0815      Check-In   Supervising physician immediately available to respond to emergencies  See telemetry face sheet for immediately available MD    Location  [facility]    Staff Present  Russella Dar, MS, EP, [facility], Exercise Physiologist;Debra Wynetta Emery, RN, Cory Munch, Exercise Physiologist    Virtual Visit  No    Medication changes reported      No    Fall or balance concerns reported     No    Tobacco Cessation  No Change    Warm-up and Cool-down  Performed as group-led instruction    Resistance Training Performed  Yes    VAD Patient?  No    PAD/SET Patient?  No      Pain Assessment   Currently in Pain?  No/denies    Pain Score  0-No pain    Multiple Pain Sites  No       Capillary Blood Glucose: No results found for this or any previous visit (from the past 24 hour(s)).    Social History   Tobacco Use  Smoking Status Former Smoker  . Packs/day: 0.50  . Years: 10.00  . Pack years: 5.00  . Types: Cigarettes  Smokeless Tobacco Never Used    Goals Met:  Independence with exercise equipment Exercise tolerated well No report of cardiac concerns or symptoms Strength training completed today  Goals Unmet:  Not Applicable  Comments: Pt able to follow exercise prescription today without complaint.  Will continue to monitor for progression. Check out 0915.   Dr. Kate Sable is Medical Director for [facility] Cardiac and Pulmonary Rehab.

## 2018-12-16 ENCOUNTER — Encounter (HOSPITAL_COMMUNITY): Payer: Medicare Other

## 2018-12-18 ENCOUNTER — Encounter (HOSPITAL_COMMUNITY)
Admission: RE | Admit: 2018-12-18 | Discharge: 2018-12-18 | Disposition: A | Discharge status: Home / Self Care | Payer: Medicare Other | Source: Ambulatory Visit | Attending: Cardiovascular Disease | Admitting: Cardiovascular Disease

## 2018-12-18 ENCOUNTER — Other Ambulatory Visit: Payer: Self-pay

## 2018-12-18 DIAGNOSIS — Z953 Presence of xenogenic heart valve: Secondary | ICD-10-CM | POA: Diagnosis not present

## 2018-12-18 DIAGNOSIS — Z9889 Other specified postprocedural states: Secondary | ICD-10-CM

## 2018-12-18 NOTE — Progress Notes (Signed)
Daily Session Note  Patient Details  Name: Hannah Roy MRN: [id number] Date of Birth: 1940/07/23 Referring Provider:     CARDIAC REHAB PHASE II ORIENTATION from 11/13/2018 in [place]  Referring Provider  [name]       Encounter Date: 12/18/2018  Check In: Session Check In - 12/18/18 0815      Check-In   Supervising physician immediately available to respond to emergencies  See telemetry face sheet for immediately available MD    Location  [facility]    Staff Present  Benay Pike, Exercise Physiologist;Debra Wynetta Emery, RN, BSN    Virtual Visit  No    Medication changes reported      No    Fall or balance concerns reported     No    Tobacco Cessation  No Change    Warm-up and Cool-down  Performed as group-led instruction    Resistance Training Performed  Yes    VAD Patient?  No    PAD/SET Patient?  No      Pain Assessment   Currently in Pain?  No/denies    Pain Score  0-No pain    Multiple Pain Sites  No       Capillary Blood Glucose: No results found for this or any previous visit (from the past 24 hour(s)).    Social History   Tobacco Use  Smoking Status Former Smoker  . Packs/day: 0.50  . Years: 10.00  . Pack years: 5.00  . Types: Cigarettes  Smokeless Tobacco Never Used    Goals Met:  Independence with exercise equipment Exercise tolerated well No report of cardiac concerns or symptoms Strength training completed today  Goals Unmet:  Not Applicable  Comments: Pt able to follow exercise prescription today without complaint.  Will continue to monitor for progression. Check out 0915.   Dr. Kate Sable is Medical Director for [facility] Cardiac and Pulmonary Rehab.

## 2018-12-19 ENCOUNTER — Ambulatory Visit (INDEPENDENT_AMBULATORY_CARE_PROVIDER_SITE_OTHER): Payer: Medicare Other | Admitting: *Deleted

## 2018-12-19 DIAGNOSIS — Z5181 Encounter for therapeutic drug level monitoring: Secondary | ICD-10-CM

## 2018-12-19 DIAGNOSIS — Z953 Presence of xenogenic heart valve: Secondary | ICD-10-CM | POA: Diagnosis not present

## 2018-12-19 DIAGNOSIS — I4819 Other persistent atrial fibrillation: Secondary | ICD-10-CM | POA: Diagnosis not present

## 2018-12-19 LAB — POCT INR: INR: 2.1 (ref 2.0–3.0)

## 2018-12-19 NOTE — Patient Instructions (Signed)
Increase coumadin to 1 1/2 tablets daily except 2 tablets on Mondays and Thursdays Recheck in 2 wks

## 2018-12-20 ENCOUNTER — Other Ambulatory Visit: Payer: Self-pay

## 2018-12-20 ENCOUNTER — Encounter (HOSPITAL_COMMUNITY)
Admission: RE | Admit: 2018-12-20 | Discharge: 2018-12-20 | Disposition: A | Discharge status: Home / Self Care | Payer: Medicare Other | Source: Ambulatory Visit | Attending: Cardiovascular Disease | Admitting: Cardiovascular Disease

## 2018-12-20 DIAGNOSIS — Z9889 Other specified postprocedural states: Secondary | ICD-10-CM

## 2018-12-20 DIAGNOSIS — Z953 Presence of xenogenic heart valve: Secondary | ICD-10-CM

## 2018-12-20 NOTE — Progress Notes (Signed)
Daily Session Note  Patient Details  Name: Hannah Roy MRN: [id number] Date of Birth: 08/27/1940 Referring Provider:     CARDIAC REHAB PHASE II ORIENTATION from 11/13/2018 in [place]  Referring Provider  [name]       Encounter Date: 12/20/2018  Check In: Session Check In - 12/20/18 0815      Check-In   Supervising physician immediately available to respond to emergencies  See telemetry face sheet for immediately available MD    Location  [facility]    Staff Present  Benay Pike, Exercise Physiologist;Debra Wynetta Emery, RN, BSN    Virtual Visit  No    Medication changes reported      No    Fall or balance concerns reported     No    Tobacco Cessation  No Change    Warm-up and Cool-down  Performed as group-led instruction    Resistance Training Performed  Yes    VAD Patient?  No    PAD/SET Patient?  No      Pain Assessment   Currently in Pain?  No/denies    Pain Score  0-No pain    Multiple Pain Sites  No       Capillary Blood Glucose: Results for orders placed or performed in visit on 12/19/18 (from the past 24 hour(s))  POCT INR     Status: Abnormal   Collection Time: 12/19/18  2:15 PM  Result Value Ref Range   INR 2.1 2.0 - 3.0      Social History   Tobacco Use  Smoking Status Former Smoker  . Packs/day: 0.50  . Years: 10.00  . Pack years: 5.00  . Types: Cigarettes  Smokeless Tobacco Never Used    Goals Met:  Improved SOB with ADL's Exercise tolerated well No report of cardiac concerns or symptoms Strength training completed today  Goals Unmet:  Not Applicable  Comments: Pt able to follow exercise prescription today without complaint.  Will continue to monitor for progression. Check out 0915.   Dr. Kate Sable is Medical Director for [facility] Cardiac and Pulmonary Rehab.

## 2018-12-23 ENCOUNTER — Ambulatory Visit (INDEPENDENT_AMBULATORY_CARE_PROVIDER_SITE_OTHER): Payer: Medicare Other | Admitting: Cardiovascular Disease

## 2018-12-23 ENCOUNTER — Encounter: Payer: Self-pay | Admitting: Cardiovascular Disease

## 2018-12-23 ENCOUNTER — Telehealth: Payer: Self-pay | Admitting: Cardiovascular Disease

## 2018-12-23 ENCOUNTER — Encounter (HOSPITAL_COMMUNITY)
Admission: RE | Admit: 2018-12-23 | Discharge: 2018-12-23 | Disposition: A | Discharge status: Home / Self Care | Payer: Medicare Other | Source: Ambulatory Visit | Attending: Cardiovascular Disease | Admitting: Cardiovascular Disease

## 2018-12-23 ENCOUNTER — Other Ambulatory Visit: Payer: Self-pay

## 2018-12-23 VITALS — BP 133/90 | HR 96 | Wt 116.8 lb

## 2018-12-23 DIAGNOSIS — Z9889 Other specified postprocedural states: Secondary | ICD-10-CM | POA: Diagnosis not present

## 2018-12-23 DIAGNOSIS — Z953 Presence of xenogenic heart valve: Secondary | ICD-10-CM | POA: Diagnosis not present

## 2018-12-23 DIAGNOSIS — I10 Essential (primary) hypertension: Secondary | ICD-10-CM

## 2018-12-23 DIAGNOSIS — I313 Pericardial effusion (noninflammatory): Secondary | ICD-10-CM | POA: Diagnosis not present

## 2018-12-23 DIAGNOSIS — I3139 Other pericardial effusion (noninflammatory): Secondary | ICD-10-CM

## 2018-12-23 DIAGNOSIS — I4819 Other persistent atrial fibrillation: Secondary | ICD-10-CM | POA: Diagnosis not present

## 2018-12-23 NOTE — Patient Instructions (Signed)
Medication Instructions: Your physician recommends that you continue on your current medications as directed. Please refer to the Current Medication list given to you today.   Labwork: None  Procedures/Testing: None  Follow-Up: 3 months with Dr.Koneswaran  Any Additional Special Instructions Will Be Listed Below (If Applicable).     If you need a refill on your cardiac medications before your next appointment, please call your pharmacy.      Thank you for choosing [place] !

## 2018-12-23 NOTE — Telephone Encounter (Signed)

## 2018-12-23 NOTE — Progress Notes (Signed)
Daily Session Note  Patient Details  Name: Hannah Roy MRN: [id number] Date of Birth: November 18, 1940 Referring Provider:     CARDIAC REHAB PHASE II ORIENTATION from 11/13/2018 in [place]  Referring Provider  [name]       Encounter Date: 12/23/2018  Check In: Session Check In - 12/23/18 0815      Check-In   Supervising physician immediately available to respond to emergencies  See telemetry face sheet for immediately available MD    Location  [facility]    Staff Present  Russella Dar, MS, EP, [facility], Exercise Physiologist;Senta Kantor Zachery Conch, Exercise Physiologist;Debra Wynetta Emery, RN, BSN    Virtual Visit  No    Medication changes reported      No    Fall or balance concerns reported     No    Tobacco Cessation  No Change    Warm-up and Cool-down  Performed as group-led instruction    Resistance Training Performed  Yes    VAD Patient?  No    PAD/SET Patient?  No      Pain Assessment   Currently in Pain?  No/denies    Pain Score  0-No pain    Multiple Pain Sites  No       Capillary Blood Glucose: No results found for this or any previous visit (from the past 24 hour(s)).    Social History   Tobacco Use  Smoking Status Former Smoker  . Packs/day: 0.50  . Years: 10.00  . Pack years: 5.00  . Types: Cigarettes  Smokeless Tobacco Never Used    Goals Met:  Independence with exercise equipment Exercise tolerated well No report of cardiac concerns or symptoms Strength training completed today  Goals Unmet:  Not Applicable  Comments: Pt able to follow exercise prescription today without complaint.  Will continue to monitor for progression. ]Check out 0915.   Dr. Kate Sable is Medical Director for [facility] Cardiac and Pulmonary Rehab.

## 2018-12-23 NOTE — Progress Notes (Signed)
SUBJECTIVE: The patient presents for routine follow-up.  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  She had recently been having issues with hypertension and her PCP started losartan.  Her blood pressures are now better controlled.  She was hospitalized for anemia secondary to an acute GI bleed in July 2020.  Aspirin and warfarin were both held.  It was recommended by EP that anticoagulation could be resumed in the outpatient setting.  She is doing well and denies chest pain, palpitations, leg swelling, and shortness of breath.  She has been gardening.  She told me about her daughter, Lattie Haw, who is being treated for renal cancer with radiation therapy.  The patient's husband has bladder cancer and underwent surgery earlier this year.  Social history: She is originally from [place], [place].  She has a strong faith. [place] 75.  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  She has an adult daughter Sheffield Slider) who lives in [place] who has 3 sons.  Review of Systems: As per "subjective", otherwise negative.  Allergies  Allergen Reactions  . Bee Venom Swelling    Welps  . Boniva [Ibandronic Acid] Nausea And Vomiting and Other (See Comments)    Upset stomach, flu like symptoms   . Lisinopril Cough  . Latex Rash    Current Outpatient Medications  Medication Sig Dispense Refill  . losartan (COZAAR) 50 MG tablet Take 1 tablet (50 mg total) by mouth  daily. 30 tablet 5  . OVER THE COUNTER MEDICATION Stool softner    . pantoprazole (PROTONIX) 40 MG tablet Take 1 tablet (40 mg total) by mouth daily before breakfast. 30 tablet 11  . rosuvastatin (CRESTOR) 5 MG tablet Take one tablet on Monday and fridays. 8 tablet 6  . warfarin (COUMADIN) 2.5 MG tablet Take as directed 1.5 or 2 tablets daily follow current instructions 60 tablet 1   No current facility-administered medications for this visit.     Past Medical History:  Diagnosis Date  . Aortic atherosclerosis ([place] [place])   . Aortic insufficiency, rheumatic   . 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 Atrial fibrillation ([place]) 05/10/2018  . Colon polyps   . Essential hypertension, benign   . History of seasonal allergies   . Hypertension   . Hypothyroidism   . Mitral stenosis with regurgitation, rheumatic   . Osteopenia 2006  . Persistent atrial fibrillation   . 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   21 mm [facility] Ease stented bovine pericardial tissue valve  . S/P ascending aortic aneurysm repair 09/05/2018   28 mm Hemashield platinum supracoronary straight graft  . S/P Maze operation for atrial fibrillation 09/05/2018   Complete bilateral atrial lesion set using bipolar radiofrequency and cryothermy ablation with clipping of LA appendage  . 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   29 mm [facility] Mitral stented bovine pericardial tissue valve  . 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  Tricuspid regurgitation     Past Surgical History:  Procedure Laterality Date  . AORTIC VALVE REPLACEMENT N/A 09/05/2018   Procedure: AORTIC VALVE REPLACEMENT (AVR) USING MAGNA EASE 21MM AOTIC BIOPROSTHESIS VALVE;  Surgeon: Rexene Alberts, MD;  Location: [place];  Service: Open Heart Surgery;  Laterality: N/A;  . APPENDECTOMY    . CLIPPING OF ATRIAL APPENDAGE  09/05/2018   Procedure: Clipping Of Atrial  Appendage;  Surgeon: Rexene Alberts, MD;  Location: [place];  Service: Open Heart Surgery;;  . COLONOSCOPY  03/23/2011   repeat in 5 years,Procedure: COLONOSCOPY;  Surgeon: Rogene Houston, MD;  [facility];  Service: Endoscopy;  Laterality: N/A;  1:00  . COLONOSCOPY N/A 11/09/2016   Procedure: COLONOSCOPY;  Surgeon: Rogene Houston, MD;  [facility];  Service: Endoscopy;  Laterality: N/A;  1030  . ESOPHAGOGASTRODUODENOSCOPY N/A 10/10/2018   Procedure: ESOPHAGOGASTRODUODENOSCOPY (EGD);  Surgeon: Doran Stabler, MD;  Location: [place];  Service: Gastroenterology;  Laterality: N/A;  . ESOPHAGOGASTRODUODENOSCOPY (EGD) WITH PROPOFOL N/A 10/08/2018   Procedure: ESOPHAGOGASTRODUODENOSCOPY (EGD) WITH PROPOFOL;  Surgeon: Danie Binder, MD;  [facility];  Service: Endoscopy;  Laterality: N/A;  . ESOPHAGOGASTRODUODENOSCOPY (EGD) WITH PROPOFOL N/A 11/15/2018   Procedure: ESOPHAGOGASTRODUODENOSCOPY (EGD) WITH PROPOFOL;  Surgeon: Rogene Houston, MD;  [facility];  Service: Endoscopy;  Laterality: N/A;  12:55  . HOT HEMOSTASIS N/A 10/10/2018   Procedure: HOT HEMOSTASIS (ARGON PLASMA COAGULATION/BICAP);  Surgeon: Doran Stabler, MD;  Location: [place];  Service: Gastroenterology;  Laterality: N/A;  . IR ANGIOGRAM SELECTIVE EACH ADDITIONAL VESSEL  10/08/2018  . IR ANGIOGRAM VISCERAL SELECTIVE  10/08/2018  . IR EMBO ART  VEN HEMORR LYMPH EXTRAV  INC GUIDE ROADMAPPING  10/08/2018  . IR US GUIDE VASC ACCESS RIGHT  10/08/2018  . MAZE N/A 09/05/2018   Procedure: MAZE;  Surgeon: Rexene Alberts, MD;  Location: [place];  Service: Open Heart Surgery;  Laterality: N/A;  . MITRAL VALVE REPLACEMENT N/A 09/05/2018   Procedure: MITRAL VALVE (MV) REPLACEMENT USING MAGNA MITRAL EASE 29MM BIOPROSTHESIS VALVE;  Surgeon: Rexene Alberts, MD;  Location: [place];  Service: Open Heart Surgery;  Laterality: N/A;  . REPLACEMENT ASCENDING AORTA N/A 09/05/2018   Procedure:  SUPRACORONARY STRAIGHT GRAFT REPLACEMENT OF ASCENDING AORTA;  Surgeon: Rexene Alberts, MD;  Location: [place];  Service: Open Heart Surgery;  Laterality: N/A;  . RIGHT/LEFT HEART CATH AND CORONARY ANGIOGRAPHY N/A 08/29/2018   Procedure: RIGHT/LEFT HEART CATH AND CORONARY ANGIOGRAPHY;  Surgeon: Lorretta Harp, MD;  Location: [facility] [facility];  Service: Cardiovascular;  Laterality: N/A;  . TEE WITHOUT CARDIOVERSION N/A 08/29/2018   Procedure: TRANSESOPHAGEAL ECHOCARDIOGRAM (TEE);  Surgeon: Skeet Latch, MD;  Location: [place];  Service: Cardiovascular;  Laterality: N/A;  . TRICUSPID VALVE REPLACEMENT N/A 09/05/2018   Procedure: TRICUSPID VALVE REPAIR USING EDWARDS MC3 TRICUSPID ANNULOPLASTY RING SIZE T28;  Surgeon: Rexene Alberts, MD;  Location: [place];  Service: Open Heart Surgery;  Laterality: N/A;  . TUBAL LIGATION      Social History   Socioeconomic History  . Marital status: Married    Spouse name: Not on file  . Number of children: Not on file  . Years of education: Not on file  . Highest education level: Not on file  Occupational History  . Not on file  Social Needs  . Financial resource strain: Not very hard  . Food insecurity    Worry:  Never true    Inability: Never true  . Transportation needs    Medical: No    Non-medical: No  Tobacco Use  . Smoking status: Former Smoker    Packs/day: 0.50    Years: 10.00    Pack years: 5.00    Types: Cigarettes  . Smokeless tobacco: Never Used  Substance and Sexual Activity  . Alcohol use: No  . Drug use: No  . Sexual activity: Yes  Lifestyle  . Physical activity    Days per week: 3 days    Minutes per session: 20 min  . Stress: Only a little  Relationships  . Social connections    Talks on phone: More than three times a week    Gets together: Three times a week    Attends religious service: 1 to 4 times per year    Active member of club or organization: No    Attends meetings of clubs or organizations: Never     Relationship status: Married  . Intimate partner violence    Fear of current or ex partner: No    Emotionally abused: No    Physically abused: No    Forced sexual activity: No  Other Topics Concern  . Not on file  Social History Narrative  . Not on file     Vitals:   12/23/18 0907  BP: 133/90  Pulse: 96  SpO2: 97%  Weight: 116 lb 12.8 oz (53 kg)    Wt Readings from Last 3 Encounters:  12/23/18 116 lb 12.8 oz (53 kg)  12/04/18 116 lb (52.6 kg)  11/26/18 115 lb 6.4 oz (52.3 kg)     PHYSICAL EXAM General: NAD HEENT: Normal. Neck: No JVD, no thyromegaly. Lungs: Clear to auscultation bilaterally with normal respiratory effort. CV: Regular rate and rhythm, normal S1/S2, no [id number], 2/6 systolic murmur heard over bilateral upper sternal borders. No pretibial or periankle edema.  No carotid bruit.   Abdomen: Soft, nontender, no distention.  Neurologic: Alert and oriented.  Psych: Normal affect. Skin: Normal. Musculoskeletal: No gross deformities.      Labs: Lab Results  Component Value Date/Time   K 4.6 11/27/2018 09:23 AM   BUN 14 11/27/2018 09:23 AM   CREATININE 0.73 11/27/2018 09:23 AM   CREATININE 0.66 05/28/2014 10:07 AM   ALT 10 10/17/2018 08:46 AM   TSH 3.399 08/31/2018 09:54 PM   TSH 3.440 05/15/2018 11:22 AM   HGB 11.4 11/27/2018 09:23 AM     Lipids: Lab Results  Component Value Date/Time   LDLCALC 125 (H) 11/27/2018 09:23 AM   CHOL 197 11/27/2018 09:23 AM   TRIG 95 11/27/2018 09:23 AM   HDL 53 11/27/2018 09:23 AM      Echo 10/08/18  1. The left ventricle has normal systolic function with an ejection fraction of 60-65%. The cavity size was normal. There is moderately increased left ventricular wall thickness. Left ventricular diastolic Doppler parameters are consistent with  restrictive filling. No evidence of left ventricular regional wall motion abnormalities. 2. Left atrial size was mildly dilated. 3. A 29 mm [facility] Mitral bovine  bioprosthetic valve is present in the mitral position. No significant mitral regurgitation. Valve function grossly normal with normal mean gradient. 4. A 65mm Edwards Magna Ease bovine bioprosthesis is present in the aortic position. No perivalvular leak noted. Sinuses of Valsalva seen with normal aortic root size. LVOT is small. valve function is grossly normal with normal mean gradient. No aortic  regurgitation. 5. The right ventricle  has normal systolic function. The cavity was normal. There is no increase in right ventricular wall thickness. Right ventricular systolic pressure is normal with an estimated pressure of 21.7 mmHg. 6. The aortic root is normal in size and structure. 7. 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 Status post tricuspid valve repair. There is mild tricuspid regurgitation. 9. Moderate pericardial effusion. 10. The pericardial effusion is circumferential.  Echocardiogram 10/30/2018:   1. The left ventricle has normal systolic function, with an ejection fraction of 55-60%. The cavity size was normal. There is moderately increased left ventricular wall thickness. Left ventricular diastolic Doppler parameters are indeterminate.  2. The right ventricle has low normal systolic function. The cavity was mildly enlarged. There is no increase in right ventricular wall thickness.  3. Left atrial size was severely dilated.  4. Right atrial size was severely dilated.  5. Small pericardial effusion.  6. The pericardial effusion is circumferential.  7. Mildly calcified tricuspid valve leaflets.  8. Mildly thickened tricuspid valve leaflets.  9. The mitral valve is abnormal. No evidence of mitral valve stenosis. 10. MAGNA MITRAL EASE 29MM BIOPROSTHESIS VALVE is in the MV position. There is no significant perivalvular regurgitation. Mean gradient across the valve is 5 mmHg which is within expected range. 11. The tricuspid valve is abnormal. 12. EDWARDS MC3 TRICUSPID ANNULOPLASTY RING  SIZE T28 is present. 13. The aortic valve is abnormal. Aortic valve regurgitation is trivial by color flow Doppler. No stenosis of the aortic valve. 14. MAGNA EASE 21MM AOTIC BIOPROSTHESIc VALVE is in the AV position. No perivalvular regurgitation, no significant gradients across the valve (mean grad 8 mmHg). 15. The aorta is normal in size and structure. 16. The aortic root is normal in size and structure. 17. Pulmonary hypertension is is not present, PASP is 24 mmHg.   ASSESSMENT AND PLAN:  1.  Valvular heart disease: Status post aortic and mitral bioprosthetic valve replacement and tricuspid valve repair with Maze procedure and repair of ascending aortic aneurysm by Dr. Roxy Manns.  Currently on warfarin.  She will need SBE prophylaxis prior to undergoing dental procedures.  2.  Persistent atrial fibrillation: Status post Maze procedure.  Symptomatically stable.  Continue warfarin.   3.  Hypertension: Blood pressure is reasonably controlled.  Continue losartan 50 mg.  4.  Pericardial effusion: Small by echocardiogram on 10/30/2018.   Disposition: Follow up 3 months   Kate Sable, M.D., F.A.C.C.

## 2018-12-25 ENCOUNTER — Other Ambulatory Visit: Payer: Self-pay

## 2018-12-25 ENCOUNTER — Encounter (HOSPITAL_COMMUNITY)
Admission: RE | Admit: 2018-12-25 | Discharge: 2018-12-25 | Disposition: A | Discharge status: Home / Self Care | Payer: Medicare Other | Source: Ambulatory Visit | Attending: Cardiovascular Disease | Admitting: Cardiovascular Disease

## 2018-12-25 DIAGNOSIS — Z9889 Other specified postprocedural states: Secondary | ICD-10-CM

## 2018-12-25 DIAGNOSIS — Z953 Presence of xenogenic heart valve: Secondary | ICD-10-CM

## 2018-12-25 NOTE — Progress Notes (Signed)
Daily Session Note  Patient Details  Name: Hannah Roy MRN: [id number] Date of Birth: 04/04/1941 Referring Provider:     CARDIAC REHAB PHASE II ORIENTATION from 11/13/2018 in [place]  Referring Provider  [name]       Encounter Date: 12/25/2018  Check In: Session Check In - 12/25/18 0813      Check-In   Supervising physician immediately available to respond to emergencies  See telemetry face sheet for immediately available MD    Location  [facility]    Staff Present  Russella Dar, MS, EP, [facility], Exercise Physiologist;Tyrek Lawhorn Zachery Conch, Exercise Physiologist;Debra Wynetta Emery, RN, BSN    Virtual Visit  No    Medication changes reported      No    Fall or balance concerns reported     No    Tobacco Cessation  No Change    Warm-up and Cool-down  Performed as group-led instruction    Resistance Training Performed  Yes    VAD Patient?  No    PAD/SET Patient?  No      Pain Assessment   Currently in Pain?  No/denies    Pain Score  0-No pain    Multiple Pain Sites  No       Capillary Blood Glucose: No results found for this or any previous visit (from the past 24 hour(s)).    Social History   Tobacco Use  Smoking Status Former Smoker  . Packs/day: 0.50  . Years: 10.00  . Pack years: 5.00  . Types: Cigarettes  Smokeless Tobacco Never Used    Goals Met:  Independence with exercise equipment Exercise tolerated well No report of cardiac concerns or symptoms Strength training completed today  Goals Unmet:  Not Applicable  Comments: Pt able to follow exercise prescription today without complaint.  Will continue to monitor for progression. Check out 0915.   Dr. Kate Sable is Medical Director for [facility] Cardiac and Pulmonary Rehab.

## 2018-12-27 ENCOUNTER — Other Ambulatory Visit: Payer: Self-pay

## 2018-12-27 ENCOUNTER — Encounter (HOSPITAL_COMMUNITY)
Admission: RE | Admit: 2018-12-27 | Discharge: 2018-12-27 | Disposition: A | Discharge status: Home / Self Care | Payer: Medicare Other | Source: Ambulatory Visit | Attending: Cardiovascular Disease | Admitting: Cardiovascular Disease

## 2018-12-27 DIAGNOSIS — Z953 Presence of xenogenic heart valve: Secondary | ICD-10-CM

## 2018-12-27 DIAGNOSIS — Z9889 Other specified postprocedural states: Secondary | ICD-10-CM | POA: Diagnosis not present

## 2018-12-27 NOTE — Progress Notes (Signed)
Daily Session Note  Patient Details  Name: Hannah Roy MRN: [id number] Date of Birth: 11-Oct-1940 Referring Provider:     CARDIAC REHAB PHASE II ORIENTATION from 11/13/2018 in [place]  Referring Provider  [name]       Encounter Date: 12/27/2018  Check In: Session Check In - 12/27/18 0815      Check-In   Supervising physician immediately available to respond to emergencies  See telemetry face sheet for immediately available MD    Location  [facility]    Staff Present  Benay Pike, Exercise Physiologist;Debra Wynetta Emery, RN, BSN    Virtual Visit  No    Medication changes reported      No    Fall or balance concerns reported     No    Tobacco Cessation  No Change    Warm-up and Cool-down  Performed as group-led instruction    Resistance Training Performed  Yes    VAD Patient?  No    PAD/SET Patient?  No      Pain Assessment   Currently in Pain?  No/denies    Pain Score  0-No pain    Multiple Pain Sites  No       Capillary Blood Glucose: No results found for this or any previous visit (from the past 24 hour(s)).    Social History   Tobacco Use  Smoking Status Former Smoker  . Packs/day: 0.50  . Years: 10.00  . Pack years: 5.00  . Types: Cigarettes  Smokeless Tobacco Never Used    Goals Met:  Independence with exercise equipment Exercise tolerated well No report of cardiac concerns or symptoms Strength training completed today  Goals Unmet:  Not Applicable  Comments: Pt able to follow exercise prescription today without complaint.  Will continue to monitor for progression. Checkout [id number].   Dr. Kate Sable is Medical Director for [facility] Cardiac and Pulmonary Rehab.

## 2018-12-30 ENCOUNTER — Other Ambulatory Visit: Payer: Self-pay

## 2018-12-30 ENCOUNTER — Ambulatory Visit (INDEPENDENT_AMBULATORY_CARE_PROVIDER_SITE_OTHER): Payer: Medicare Other | Admitting: Thoracic Surgery (Cardiothoracic Vascular Surgery)

## 2018-12-30 ENCOUNTER — Encounter: Payer: Self-pay | Admitting: Thoracic Surgery (Cardiothoracic Vascular Surgery)

## 2018-12-30 ENCOUNTER — Telehealth: Payer: Self-pay

## 2018-12-30 ENCOUNTER — Encounter (HOSPITAL_COMMUNITY)
Admission: RE | Admit: 2018-12-30 | Discharge: 2018-12-30 | Disposition: A | Discharge status: Home / Self Care | Payer: Medicare Other | Source: Ambulatory Visit | Attending: Cardiovascular Disease | Admitting: Cardiovascular Disease

## 2018-12-30 VITALS — BP 133/73 | HR 93 | Temp 97.7°F | Resp 16 | Ht 61.0 in | Wt 116.0 lb

## 2018-12-30 DIAGNOSIS — Z953 Presence of xenogenic heart valve: Secondary | ICD-10-CM

## 2018-12-30 DIAGNOSIS — Z8679 Personal history of other diseases of the circulatory system: Secondary | ICD-10-CM | POA: Diagnosis not present

## 2018-12-30 DIAGNOSIS — Z9889 Other specified postprocedural states: Secondary | ICD-10-CM

## 2018-12-30 MED ORDER — LOSARTAN POTASSIUM 50 MG PO TABS: ORAL_TABLET | ORAL | 3 refills | Status: DC

## 2018-12-30 NOTE — Progress Notes (Signed)
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  The patient's early postoperative recovery in the hospital was notable for transient complete heart block which resolved.  She was discharged from the hospital in sinus rhythm on the eighth postoperative day on warfarin anticoagulation.  She was last seen here in our office on 10/21/2018 temporarily off of warfarin anticoagulation following a GI bleed.  She was otherwise doing well.  Subsequent follow-up echocardiogram performed October 30, 2018 revealed a small circumferential pericardial effusion which had decreased in size in comparison with previous echocardiogram performed on October 08, 2018.  Left ventricular function was normal.  The aortic and mitral valve prosthetic valves were functioning normally.  The tricuspid valve repair remained intact with what was reported to be mild residual tricuspid regurgitation.  The patient was recently seen in follow-up by Dr. [name] and most recent INR was reported 2.1 on December 19, 2018.    Patient returns her office today and reports that she is doing exceptionally well.  She no longer has any pain in her chest.  She states that it still feels a little "tight" across the chest but this does not bother her significantly at all.  She has no  shortness of breath.  She is participating in a cardiac rehab program and doing quite well.  She has not had any palpitations.  She has been under some stress lately because her daughter has recently been diagnosed with metastatic renal cell carcinoma.  Otherwise she is doing very well.   Current Outpatient Medications  Medication Sig Dispense Refill   losartan (COZAAR) 50 MG tablet Take 1 tablet (50 mg total) by mouth daily. 30 tablet 5   OVER THE COUNTER MEDICATION Stool softner     pantoprazole (PROTONIX) 40 MG tablet Take 1 tablet (40 mg total) by mouth daily before breakfast. 30 tablet 11   rosuvastatin (CRESTOR) 5 MG tablet Take one tablet on Monday and fridays. 8 tablet 6   warfarin (COUMADIN) 2.5 MG tablet Take as directed 1.5 or 2 tablets daily follow current instructions 60 tablet 1   No current facility-administered medications for this visit.       Physical Exam:   BP 133/73 (BP Location: Right Arm, Patient Position: Sitting, Cuff Size: Normal)    Pulse 93    Temp 97.7 F (36.5 C)    Resp 16    Ht 5\' 1"  (1.549 m)    Wt 116 lb (52.6 kg)    SpO2 95% Comment: ON RA   BMI 21.92 kg/m   General:  Well-appearing  Chest:   Clear to auscultation  CV:   Regular rate and rhythm without murmur  Incisions:  Completely healed, sternum is stable  Abdomen:  Soft nontender  Extremities:  Warm and well-perfused, no edema  Diagnostic Tests:  ECHOCARDIOGRAM REPORT       Patient Name:   Hannah Roy Date of Exam: 10/30/2018 Medical Rec #:  [id number]   Height:       61.0 in Accession #:    [id number]  Weight:       115.0 lb Date of Birth:  16-Dec-1940   BSA:          1.49 m Patient Age:    3 years    BP:           124/80 mmHg Patient Gender: F           HR:           89 bpm. 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 Atrial fibrillation with RVR Aortic Valve                 Disease and Mitral Valve Disease. Pericardial effusion, S/P                 aortic + mitral valve replacement with bioprosthetic valves +                 tricuspid valve repair + maze procedure + repair ascending                 aortic aneurysm.   Sonographer:    Alvino Chapel RCS Referring Phys: [place]    1. The left ventricle has normal systolic function, with an ejection fraction of 55-60%. The cavity size was normal. There is moderately increased left ventricular wall thickness. Left ventricular diastolic Doppler parameters are indeterminate.  2. The right ventricle has low normal systolic function. The cavity was mildly enlarged. There is no increase in right ventricular wall thickness.  3. Left atrial size was severely dilated.  4. Right atrial size was severely dilated.  5. Small pericardial effusion.  6. The pericardial effusion is circumferential.  7. Mildly calcified tricuspid valve leaflets.  8. Mildly thickened tricuspid valve leaflets.  9. The mitral valve is abnormal. No evidence of mitral valve stenosis. 10. MAGNA MITRAL EASE 29MM BIOPROSTHESIS VALVE is in the MV position. There is no significant perivalvular regurgitation. Mean gradient across the valve is 5 mmHg which is within expected range. 11. The tricuspid valve is abnormal. 12. EDWARDS MC3 TRICUSPID ANNULOPLASTY RING SIZE T28 is present. 13. The aortic valve is abnormal. Aortic valve regurgitation is trivial by color flow Doppler. No stenosis of the aortic valve. 14. MAGNA EASE 21MM AOTIC BIOPROSTHESIc VALVE is in the AV position. No perivalvular regurgitation, no significant gradients across the valve (mean grad 8 mmHg). 15. The aorta is normal in size and structure. 16. The aortic root is normal in size and  structure. 17. Pulmonary hypertension is is not present, PASP is 24 mmHg.  FINDINGS  Left Ventricle: The left ventricle has normal systolic function, with an ejection fraction of 55-60%. The cavity size was normal. There is moderately increased left ventricular wall thickness. Left ventricular diastolic Doppler parameters are  indeterminate.  Right Ventricle: The right ventricle has low normal systolic function. The cavity was mildly enlarged. There is no increase in right ventricular wall thickness.  Left Atrium: Left atrial size was severely dilated.  Right Atrium: Right atrial size was severely dilated. Right atrial pressure is estimated at 10 mmHg.  Interatrial Septum: No atrial level shunt detected by color flow Doppler.  Pericardium: A small pericardial effusion is present. The pericardial effusion is circumferential.  Mitral Valve: The mitral valve is abnormal. Mitral valve regurgitation is not visualized by color flow Doppler. No evidence of mitral valve stenosis. MAGNA MITRAL EASE 29MM BIOPROSTHESIS VALVE is in the MV position. There is no significant perivalvular  regurgitation. Mean gradient across the valve is 5 mmHg which is within expected range.  Tricuspid Valve: The tricuspid valve is abnormal. Tricuspid valve regurgitation is mild by color flow Doppler. The tricuspid valve is mildly thickened. The tricuspid valve is mildly calcified. EDWARDS MC3 TRICUSPID ANNULOPLASTY RING SIZE T28 is present.  Aortic Valve: The aortic valve is abnormal Aortic valve regurgitation is trivial by color flow Doppler. There is No stenosis of the aortic valve, with a calculated valve area of 2.45 cm. MAGNA EASE 21MM AOTIC BIOPROSTHESIc VALVE is in the AV position.  No perivalvular regurgitation, no significant gradients across the valve (mean grad 8 mmHg).  Pulmonic Valve: The pulmonic valve was not well visualized. Pulmonic valve regurgitation is mild by color flow Doppler. No evidence  of pulmonic stenosis.  Aorta: The aortic root is normal in size and structure. The aorta is normal in size and structure.  Pulmonary Artery: Pulmonary hypertension is is not present, PASP is 24 mmHg.  Venous: The inferior vena cava is normal in size with greater than 50% respiratory variability.    +--------------+--------++  LEFT VENTRICLE                 +----------------+---------++ +--------------+--------++       Diastology                    PLAX 2D                        +----------------+---------++ +--------------+--------++       LV e' lateral:   8.49 cm/s    LVIDd:         4.16 cm         +----------------+---------++ +--------------+--------++       LV E/e' lateral: 18.0         LVIDs:         2.84 cm         +----------------+---------++ +--------------+--------++       LV e' medial:    6.09 cm/s    LV PW:         1.40 cm         +----------------+---------++ +--------------+--------++       LV E/e' medial:  25.1         LV IVS:        1.56 cm         +----------------+---------++ +--------------+--------++  LVOT diam:     1.95 cm    +--------------+--------++  LV SV:         46 ml      +--------------+--------++  LV SV Index:   30.77      +--------------+--------++  LVOT Area:     2.99 cm   +--------------+--------++                            +--------------+--------++   +------------------+---------++  LV Volumes (MOD)               +------------------+---------++  LV area d, A2C:    24.60 cm   +------------------+---------++  LV area d, A4C:    28.60 cm   +------------------+---------++  LV area s, A2C:    14.10 cm   +------------------+---------++  LV area s, A4C:    14.90 cm   +------------------+---------++  LV major d, A2C:   7.38 cm     +------------------+---------++  LV major d, A4C:   7.16 cm     +------------------+---------++  LV major s, A2C:   6.21 cm     +------------------+---------++  LV major s, A4C:   5.95 cm      +------------------+---------++  LV vol d, MOD A2C: 69.3 ml     +------------------+---------++  LV vol d, MOD A4C: 94.1 ml     +------------------+---------++  LV vol s, MOD A2C: 27.3 ml     +------------------+---------++  LV vol s, MOD A4C: 31.4 ml     +------------------+---------++  LV SV MOD A2C:     42.0 ml     +------------------+---------++  LV SV MOD A4C:     94.1 ml     +------------------+---------++  LV SV MOD BP:      52.0 ml     +------------------+---------++  +---------------+---------++  RIGHT VENTRICLE             +---------------+---------++  RV S prime:     8.27 cm/s   +---------------+---------++  TAPSE (M-mode): 1.5 cm      +---------------+---------++  RVSP:           19.9 mmHg   +---------------+---------++  +---------------+-------++-----------++  LEFT ATRIUM              Index         +---------------+-------++-----------++  LA diam:        4.20 cm  2.81 cm/m    +---------------+-------++-----------++  LA Vol (A2C):   70.9 ml  47.49 ml/m   +---------------+-------++-----------++  LA Vol (A4C):   77.9 ml  52.18 ml/m   +---------------+-------++-----------++  LA Biplane Vol: 77.3 ml  51.78 ml/m   +---------------+-------++-----------++ +------------+---------++-----------++  RIGHT ATRIUM            Index         +------------+---------++-----------++  RA Pressure: 3.00 mmHg                +------------+---------++-----------++  RA Area:     22.90 cm                +------------+---------++-----------++  RA Volume:   74.60 ml   49.97 ml/m   +------------+---------++-----------++  +------------------+------------++  AORTIC VALVE                      +------------------+------------++  AV Area (Vmax):    1.90 cm       +------------------+------------++  AV Area (Vmean):   1.99 cm       +------------------+------------++  AV Area (VTI):     2.45 cm       +------------------+------------++  AV Vmax:           209.00 cm/s     +------------------+------------++  AV Vmean:          129.000 cm/s   +------------------+------------++  AV VTI:            0.370 m        +------------------+------------++  AV Peak Grad:  17.5 mmHg      +------------------+------------++  AV Mean Grad:      8.0 mmHg       +------------------+------------++  LVOT Vmax:         133.00 cm/s    +------------------+------------++  LVOT Vmean:        86.000 cm/s    +------------------+------------++  LVOT VTI:          0.304 m        +------------------+------------++  LVOT/AV VTI ratio: 0.82           +------------------+------------++   +-------------+-------++  AORTA                   +-------------+-------++  Ao Root diam: 3.70 cm   +-------------+-------++  +--------------+----------++  +---------------+-----------++  MITRAL VALVE                  TRICUSPID VALVE               +--------------+----------++  +---------------+-----------++  MV Area (PHT): 2.91 cm       TR Peak grad:   16.9 mmHg     +--------------+----------++  +---------------+-----------++  MV Peak grad:  12.8 mmHg      TR Vmax:        232.00 cm/s   +--------------+----------++  +---------------+-----------++  MV Mean grad:  5.0 mmHg       Estimated RAP:  3.00 mmHg     +--------------+----------++  +---------------+-----------++  MV Vmax:       1.79 m/s       RVSP:           19.9 mmHg     +--------------+----------++  +---------------+-----------++  MV Vmean:      93.1 cm/s    +--------------+----------++  +--------------+-------+  MV VTI:        0.42 m         SHUNTS                  +--------------+----------++  +--------------+-------+  MV PHT:        75.69 msec     Systemic VTI:  0.30 m   +--------------+----------++  +--------------+-------+  MV Decel Time: 261 msec       Systemic Diam: 1.95 cm  +--------------+----------++  +--------------+-------+ +--------------+-----------++  MV E velocity: 153.00  cm/s   +--------------+-----------++  MV A velocity: 91.20 cm/s    +--------------+-----------++  MV E/A ratio:  1.68          +--------------+-----------++    Carlyle Dolly MD Electronically signed by Carlyle Dolly MD Signature Date/Time: 10/30/2018/3:38:47 PM      2 channel telemetry rhythm strip demonstrates normal sinus rhythm   Impression:  Patient is doing very well and maintaining sinus rhythm nearly 4 months 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  Her blood pressure is still running a bit on the high side.    Plan:  We have not made any specific recommendations for the patient's medications at this time, but I have suggested that the patient continue either increasing her dose of losartan in the evening or taking an extra 25 mg every morning.  She will discuss this with her primary care physician and Dr. [name].  I have suggested the patient should start checking her blood pressure at home on a regular basis.  Patient may continue to increase her physical activity without any particular limitations.  The patient has been reminded regarding the importance of dental hygiene and the lifelong need for antibiotic prophylaxis for all dental cleanings and other related invasive procedures.  The patient will return to our office for routine follow-up next May, approximately 1 year following her surgery.  She will call and return sooner should specific problems or questions arise.   I spent in excess of 10 minutes during the conduct of this office consultation and >50% of this time involved direct face-to-face encounter with the patient for counseling and/or coordination of their care.    Valentina Gu. Roxy Manns, MD 12/30/2018 1:34 PM

## 2018-12-30 NOTE — Progress Notes (Signed)
Daily Session Note  Patient Details  Name: Hannah Roy MRN: [id number] Date of Birth: 17-Sep-1940 Referring Provider:     [place] from 11/13/2018 in [place]  Referring Provider  [name]       Encounter Date: 12/30/2018  Check In: Session Check In - 12/30/18 0815      Check-In   Supervising physician immediately available to respond to emergencies  See telemetry face sheet for immediately available MD    Location  [facility]    Staff Present  Benay Pike, Exercise Physiologist;Debra Wynetta Emery, RN, BSN;Diane Coad, MS, EP, [facility], Exercise Physiologist    Virtual Visit  No    Medication changes reported      No    Fall or balance concerns reported     No    Tobacco Cessation  No Change    Warm-up and Cool-down  Performed as group-led instruction    Resistance Training Performed  Yes    VAD Patient?  No    PAD/SET Patient?  No      Pain Assessment   Currently in Pain?  No/denies    Pain Score  0-No pain    Multiple Pain Sites  No       Capillary Blood Glucose: No results found for this or any previous visit (from the past 24 hour(s)).    Social History   Tobacco Use  Smoking Status Former Smoker  . Packs/day: 0.50  . Years: 10.00  . Pack years: 5.00  . Types: Cigarettes  Smokeless Tobacco Never Used    Goals Met:  Independence with exercise equipment Exercise tolerated well No report of cardiac concerns or symptoms Strength training completed today  Goals Unmet:  Not Applicable  Comments: Pt able to follow exercise prescription today without complaint.  Will continue to monitor for progression. Check out 0915.   Dr. Kate Sable is Medical Director for [facility].

## 2018-12-30 NOTE — Patient Instructions (Addendum)
Continue all previous medications without any changes at this time  Check your blood pressure on a regular basis and keep a log for your records.  Discussed with your cardiologist and/or primary care physician whether or not your blood pressure medications should be adjusted.  You may resume unrestricted physical activity without any particular limitations at this time.  Endocarditis is a potentially serious infection of heart valves or inside lining of the heart.  It occurs more commonly in patients with diseased heart valves (such as patient's with aortic or mitral valve disease) and in patients who have undergone heart valve repair or replacement.  Certain surgical and dental procedures may put you at risk, such as dental cleaning, other dental procedures, or any surgery involving the respiratory, urinary, gastrointestinal tract, gallbladder or prostate gland.   To minimize your chances for develooping endocarditis, maintain good oral health and seek prompt medical attention for any infections involving the mouth, teeth, gums, skin or urinary tract.    Always notify your doctor or dentist about your underlying heart valve condition before having any invasive procedures. You will need to take antibiotics before certain procedures, including all routine dental cleanings or other dental procedures.  Your cardiologist or dentist should prescribe these antibiotics for you to be taken ahead of time.      

## 2018-12-30 NOTE — Progress Notes (Signed)
Cardiac Individual Treatment Plan  Patient Details  Name: CONSUELLO LASSALLE MRN: [id number] Date of Birth: 1940-06-30 Referring Provider:     CARDIAC REHAB PHASE II ORIENTATION from 11/13/2018 in [place]  Referring Provider  [name]       Initial Encounter Date:    CARDIAC REHAB PHASE II ORIENTATION from 11/13/2018 in [place]  Date  11/13/18      Visit Diagnosis: S/P aortic valve replacement with bioprosthetic valve  S/P tricuspid valve repair  Patient's Home Medications on Admission:  Current Outpatient Medications:  .  losartan (COZAAR) 50 MG tablet, Take 1 tablet (50 mg total) by mouth daily., Disp: 30 tablet, Rfl: 5 .  OVER THE COUNTER MEDICATION, Stool softner, Disp: , Rfl:  .  pantoprazole (PROTONIX) 40 MG tablet, Take 1 tablet (40 mg total) by mouth daily before breakfast., Disp: 30 tablet, Rfl: 11 .  rosuvastatin (CRESTOR) 5 MG tablet, Take one tablet on Monday and fridays., Disp: 8 tablet, Rfl: 6 .  warfarin (COUMADIN) 2.5 MG tablet, Take as directed 1.5 or 2 tablets daily follow current instructions, Disp: 60 tablet, Rfl: 1  Past Medical History: Past Medical History:  Diagnosis Date  . Aortic atherosclerosis ([place])   . Aortic insufficiency, rheumatic   . 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 Atrial fibrillation ([place]) 05/10/2018  . Colon polyps   . Essential hypertension, benign   . History of seasonal allergies   . Hypertension   . Hypothyroidism   . Mitral stenosis with regurgitation, rheumatic   . Osteopenia 2006  . Persistent atrial fibrillation   . 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   21 mm [facility] Ease stented bovine pericardial tissue valve  . S/P ascending aortic aneurysm repair 09/05/2018   28 mm Hemashield platinum supracoronary straight graft  . S/P [place] operation for atrial fibrillation 09/05/2018   Complete bilateral atrial lesion set using bipolar radiofrequency and  cryothermy ablation with clipping of LA appendage  . 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   29 mm [facility] Mitral stented bovine pericardial tissue valve  . 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 Tricuspid regurgitation     Tobacco Use: Social History   Tobacco Use  Smoking Status Former Smoker  . Packs/day: 0.50  . Years: 10.00  . Pack years: 5.00  . Types: Cigarettes  Smokeless Tobacco Never Used    Labs: Recent Review Flowsheet Data    Labs for ITP Cardiac and Pulmonary Rehab Latest Ref Rng & Units 09/05/2018 09/05/2018 09/06/2018 09/06/2018 11/27/2018   Cholestrol 100 - 199 mg/dL - - - - 197   LDLCALC 0 - 99 mg/dL - - - - 125(H)   HDL >39 mg/dL - - - - 53   Trlycerides 0 - 149 mg/dL - - - - 95   Hemoglobin A1c 4.8 - 5.6 % - - - - -   PHART 7.350 - 7.450 7.365 - 7.357 7.367 -   PCO2ART 32.0 - 48.0 mmHg 39.0 - 37.6 38.9 -   HCO3 20.0 - 28.0 mmol/L 22.5 - 21.0 22.3 -   TCO2 22 - 32 mmol/L 24 22 22 23  -   ACIDBASEDEF 0.0 - 2.0 mmol/L 3.0(H) - 4.0(H) 3.0(H) -   O2SAT % 100.0 - 100.0 99.0 -      Capillary Blood Glucose: Lab Results  Component Value Date   GLUCAP 89 09/09/2018   GLUCAP  93 09/08/2018   GLUCAP 110 (H) 09/08/2018   GLUCAP 121 (H) 09/08/2018   GLUCAP 124 (H) 09/08/2018     Exercise Target Goals: Exercise Program Goal: Individual exercise prescription set using results from initial 6 min walk test and THRR while considering  patient's activity barriers and safety.   Exercise Prescription Goal: Starting with aerobic activity 30 plus minutes a day, 3 days per week for initial exercise prescription. Provide home exercise prescription and guidelines that participant acknowledges understanding prior to discharge.  Activity Barriers & Risk Stratification: Activity Barriers & Cardiac Risk Stratification - 11/13/18 1346      Activity Barriers & Cardiac Risk Stratification   Activity Barriers   None    Cardiac Risk Stratification  High       6 Minute Walk: 6 Minute Walk    Row Name 11/13/18 1344         6 Minute Walk   Phase  Initial     Distance  1200 feet     Walk Time  6 minutes     # of Rest Breaks  0     MPH  2.27     METS  2.74     RPE  9     Perceived Dyspnea   9     VO2 Peak  9.4     Symptoms  No     Resting HR  75 bpm     Resting BP  160/84     Resting Oxygen Saturation   98 %     Exercise Oxygen Saturation  during 6 min walk  96 %     Max Ex. HR  95 bpm     Max Ex. BP  166/80     2 Minute Post BP  148/80        Oxygen Initial Assessment:   Oxygen Re-Evaluation:   Oxygen Discharge (Final Oxygen Re-Evaluation):   Initial Exercise Prescription: Initial Exercise Prescription - 11/13/18 1300      Date of Initial Exercise RX and Referring Provider   Date  11/13/18    Referring Provider  [facility]     Expected Discharge Date  02/13/19      Treadmill   MPH  1.3    Grade  0    Minutes  17    METs  1.99      NuStep   Level  1    SPM  60    Minutes  17    METs  1.6      Prescription Details   Frequency (times per week)  3    Duration  Progress to 30 minutes of continuous aerobic without signs/symptoms of physical distress      Intensity   THRR 40-80% of Max Heartrate  [phone]    Ratings of Perceived Exertion  11-13    Perceived Dyspnea  0-4      Progression   Progression  Continue to progress workloads to maintain intensity without signs/symptoms of physical distress.      Resistance Training   Training Prescription  Yes    Weight  1    Reps  10-15       Perform Capillary Blood Glucose checks as needed.  Exercise Prescription Changes:  Exercise Prescription Changes    Row Name 11/13/18 1300 12/05/18 1200 12/30/18 1300         Response to Exercise   Blood Pressure (Admit)  160/84  168/84  180/100     Blood  Pressure (Exercise)  166/80  190/102  192/104     Blood Pressure (Exit)  148/80  152/80  160/88     Heart  Rate (Admit)  75 bpm  84 bpm  80 bpm     Heart Rate (Exercise)  95 bpm  107 bpm  125 bpm     Heart Rate (Exit)  85 bpm  93 bpm  103 bpm     Oxygen Saturation (Admit)  98 %  -  -     Oxygen Saturation (Exercise)  96 %  -  -     Oxygen Saturation (Exit)  99 %  -  -     Rating of Perceived Exertion (Exercise)  9  -  12     Perceived Dyspnea (Exercise)  7  -  -     Symptoms  none  -  -     Comments  6 minute walk tes t  first two weeks of exercise   increase in overall MET level      Duration  Progress to 30 minutes of  aerobic without signs/symptoms of physical distress  Continue with 30 min of aerobic exercise without signs/symptoms of physical distress.  Continue with 30 min of aerobic exercise without signs/symptoms of physical distress.     Intensity  THRR New 58-86-115  THRR unchanged  THRR unchanged       Resistance Training   Training Prescription  -  Yes  Yes     Weight  -  2  3     Reps  -  10-15  10-15       Treadmill   MPH  -  1.6  2.5     Grade  -  0  0     Minutes  -  17  17     METs  -  2.22  2.91       NuStep   Level  -  2  2     SPM  -  105  128     Minutes  -  22  22     METs  -  2.3  3.2       Home Exercise Plan   Plans to continue exercise at  Home (comment)  Home (comment)  Home (comment)     Frequency  Add 2 additional days to program exercise sessions.  Add 2 additional days to program exercise sessions.  Add 2 additional days to program exercise sessions.     Initial Home Exercises Provided  11/13/18  11/13/18  11/13/18        Exercise Comments:  Exercise Comments    Row Name 11/18/18 1413 12/05/18 1229 12/09/18 1359 12/30/18 1339     Exercise Comments  Pt. is new to the program. She has attended 2 sessions so far and has tolerated the exercise well.  Pt. has done extremely well in CR. She comes everyday with the best attitude and is ready to exercise. She enjoys being here and has been tolerating the exercise well.  Pt. is regularly attending Cardiac  Rehab. She Come ready to work and is eager to increase her work loads. Her BP is finally starting to stablize so we were able to bump her to 1.9 MPH on the TM.  Pt. has now attended 19 exercise sessions. She has tolerated all increases with ease and even asks to be bumped up in the TM regularly. She is is now walking at [facility] [employer]  and will be on level 3 on the NS at her next session.       Exercise Goals and Review:  Exercise Goals    Row Name 11/13/18 1348             Exercise Goals   Increase Physical Activity  Yes       Intervention  Provide advice, education, support and counseling about physical activity/exercise needs.;Develop an individualized exercise prescription for aerobic and resistive training based on initial evaluation findings, risk stratification, comorbidities and participant's personal goals.       Expected Outcomes  Short Term: Attend rehab on a regular basis to increase amount of physical activity.;Long Term: Add in home exercise to make exercise part of routine and to increase amount of physical activity.;Long Term: Exercising regularly at least 3-5 days a week.       Increase Strength and Stamina  Yes       Intervention  Provide advice, education, support and counseling about physical activity/exercise needs.;Develop an individualized exercise prescription for aerobic and resistive training based on initial evaluation findings, risk stratification, comorbidities and participant's personal goals.       Expected Outcomes  Short Term: Increase workloads from initial exercise prescription for resistance, speed, and METs.;Short Term: Perform resistance training exercises routinely during rehab and add in resistance training at home;Long Term: Improve cardiorespiratory fitness, muscular endurance and strength as measured by increased METs and functional capacity (6MWT)       Able to understand and use rate of perceived exertion (RPE) scale  Yes       Intervention  Provide education  and explanation on how to use RPE scale       Expected Outcomes  Short Term: Able to use RPE daily in rehab to express subjective intensity level;Long Term:  Able to use RPE to guide intensity level when exercising independently       Knowledge and understanding of Target Heart Rate Range (THRR)  Yes       Intervention  Provide education and explanation of THRR including how the numbers were predicted and where they are located for reference       Expected Outcomes  Short Term: Able to state/look up THRR;Long Term: Able to use THRR to govern intensity when exercising independently;Short Term: Able to use daily as guideline for intensity in rehab       Able to check pulse independently  Yes       Intervention  Provide education and demonstration on how to check pulse in carotid and radial arteries.;Review the importance of being able to check your own pulse for safety during independent exercise       Expected Outcomes  Short Term: Able to explain why pulse checking is important during independent exercise;Long Term: Able to check pulse independently and accurately       Understanding of Exercise Prescription  Yes       Intervention  Provide education, explanation, and written materials on patient's individual exercise prescription       Expected Outcomes  Short Term: Able to explain program exercise prescription;Long Term: Able to explain home exercise prescription to exercise independently          Exercise Goals Re-Evaluation : Exercise Goals Re-Evaluation    [place] Name 11/18/18 1413 12/09/18 1358 12/30/18 1339         Exercise Goal Re-Evaluation   Exercise Goals Review  Increase Physical Activity;Increase Strength and Stamina;Able to understand and use rate of perceived exertion (  RPE) scale;Knowledge and understanding of Target Heart Rate Range (THRR);Able to check pulse independently;Understanding of Exercise Prescription  Increase Physical Activity;Increase Strength and Stamina;Able to  understand and use rate of perceived exertion (RPE) scale;Knowledge and understanding of Target Heart Rate Range (THRR);Able to check pulse independently;Understanding of Exercise Prescription  Increase Physical Activity;Increase Strength and Stamina;Able to understand and use rate of perceived exertion (RPE) scale;Knowledge and understanding of Target Heart Rate Range (THRR);Able to check pulse independently;Understanding of Exercise Prescription     Comments  Pt. is following exercise prescription to help her reach her goals.  Pt. has attended regularly. She enjoys coming to rehab and is working to increase her strength  Pt. continues to come to rehab every session. She is doing great and says she really enjoys the program. Pt. states it has helped her to increase her strength and stamina.     Expected Outcomes  Short: increase overall activity level Long: develop exercise routine for home  Short: return to ADLs Long: get back to pre-surgery strength  Short: return to ADLs Long: get back to pre-surgery strength         Discharge Exercise Prescription (Final Exercise Prescription Changes): Exercise Prescription Changes - 12/30/18 1300      Response to Exercise   Blood Pressure (Admit)  180/100    Blood Pressure (Exercise)  192/104    Blood Pressure (Exit)  160/88    Heart Rate (Admit)  80 bpm    Heart Rate (Exercise)  125 bpm    Heart Rate (Exit)  103 bpm    Rating of Perceived Exertion (Exercise)  12    Comments  increase in overall MET level     Duration  Continue with 30 min of aerobic exercise without signs/symptoms of physical distress.    Intensity  THRR unchanged      Resistance Training   Training Prescription  Yes    Weight  3    Reps  10-15      Treadmill   MPH  2.5    Grade  0    Minutes  17    METs  2.91      NuStep   Level  2    SPM  128    Minutes  22    METs  3.2      Home Exercise Plan   Plans to continue exercise at  Home (comment)    Frequency  Add 2  additional days to program exercise sessions.    Initial Home Exercises Provided  11/13/18       Nutrition:  Target Goals: Understanding of nutrition guidelines, daily intake of sodium <1570m, cholesterol <2084m calories 30% from fat and 7% or less from saturated fats, daily to have 5 or more servings of fruits and vegetables.  Biometrics: Pre Biometrics - 11/13/18 1348      Pre Biometrics   Height  5' 1"  (1.549 m)    Waist Circumference  28 inches    Hip Circumference  34 inches    Waist to Hip Ratio  0.82 %    Triceps Skinfold  8 mm    % Body Fat  28.7 %    Grip Strength  5.7 kg    Single Leg Stand  37 seconds        Nutrition Therapy Plan and Nutrition Goals: Nutrition Therapy & Goals - 12/30/18 1451      Personal Nutrition Goals   Comments  We have not resumed out RD class  post COVID-19. We are working on resuming classes. In the interim, we are providing education through hand-outs.  She continues to say she is eating heart healthy. She also states she is not eating green leafy foods due to anti-coagulant treatment.  Will continue to monitor.      Intervention Plan   Intervention  Nutrition handout(s) given to patient.       Nutrition Assessments: Nutrition Assessments - 11/13/18 1429      MEDFICTS Scores   Pre Score  21       Nutrition Goals Re-Evaluation:   Nutrition Goals Discharge (Final Nutrition Goals Re-Evaluation):   Psychosocial: Target Goals: Acknowledge presence or absence of significant depression and/or stress, maximize coping skills, provide positive support system. Participant is able to verbalize types and ability to use techniques and skills needed for reducing stress and depression.  Initial Review & Psychosocial Screening: Initial Psych Review & Screening - 11/13/18 1429      Initial Review   Current issues with  None Identified      Family Dynamics   Good Support System?  Yes      Barriers   Psychosocial barriers to participate  in program  There are no identifiable barriers or psychosocial needs.      Screening Interventions   Interventions  Encouraged to exercise;Provide feedback about the scores to participant    Expected Outcomes  Long Term goal: The participant improves quality of Life and PHQ9 Scores as seen by post scores and/or verbalization of changes       Quality of Life Scores: Quality of Life - 11/13/18 1349      Quality of Life   Select  Quality of Life      Quality of Life Scores   Health/Function Pre  21.5 %    Socioeconomic Pre  24 %    Psych/Spiritual Pre  24 %    Family Pre  22.8 %    GLOBAL Pre  22.72 %      Scores of 19 and below usually indicate a poorer quality of life in these areas.  A difference of  2-3 points is a clinically meaningful difference.  A difference of 2-3 points in the total score of the Quality of Life Index has been associated with significant improvement in overall quality of life, self-image, physical symptoms, and general health in studies assessing change in quality of life.  PHQ-9: Recent Review Flowsheet Data    Depression screen [facility] 2/9 11/13/2018 05/02/2017 01/12/2015 10/17/2013   Decreased Interest 0 0 0 0   Down, Depressed, Hopeless 0 0 0 0   PHQ - 2 Score 0 0 0 0   Altered sleeping 1 1 - -   Tired, decreased energy 1 2 - -   Change in appetite 0 0 - -   Feeling bad or failure about yourself  0 0 - -   Trouble concentrating 0 0 - -   Moving slowly or fidgety/restless 0 1 - -   Suicidal thoughts 0 0 - -   PHQ-9 Score 2 4 - -   Difficult doing work/chores Not difficult at all - - -     Interpretation of Total Score  Total Score Depression Severity:  1-4 = Minimal depression, 5-9 = Mild depression, 10-14 = Moderate depression, 15-19 = Moderately severe depression, 20-27 = Severe depression   Psychosocial Evaluation and Intervention: Psychosocial Evaluation - 11/13/18 1430      Psychosocial Evaluation & Interventions   Interventions  Encouraged to  exercise with the program and follow exercise prescription;Stress management education;Relaxation education    Comments  Patient has no psychosocial issues identified. Her initial QOL score was 22.72 and her PHQ-9 score was 2.    Expected Outcomes  Patient will have no psychosoical issues identified at discharge.    Continue Psychosocial Services   No Follow up required       Psychosocial Re-Evaluation: Psychosocial Re-Evaluation    [place] Name 12/09/18 1451 12/30/18 1455           Psychosocial Re-Evaluation   Current issues with  None Identified  None Identified      Comments  Patient's initial QOL socre was 22.72 and her PHQ-9 score was 2 with no psychosocial issues identified.  Patient's initial QOL socre was 22.72 and her PHQ-9 score was 2 with no psychosocial issues identified.      Expected Outcomes  Patient will have no psychosocial issues identified at discharge.  Patient will have no psychosocial issues identified at discharge.      Interventions  Stress management education;Encouraged to attend Cardiac Rehabilitation for the exercise;Relaxation education  Stress management education;Encouraged to attend Cardiac Rehabilitation for the exercise;Relaxation education      Continue Psychosocial Services   No Follow up required  No Follow up required         Psychosocial Discharge (Final Psychosocial Re-Evaluation): Psychosocial Re-Evaluation - 12/30/18 1455      Psychosocial Re-Evaluation   Current issues with  None Identified    Comments  Patient's initial QOL socre was 22.72 and her PHQ-9 score was 2 with no psychosocial issues identified.    Expected Outcomes  Patient will have no psychosocial issues identified at discharge.    Interventions  Stress management education;Encouraged to attend Cardiac Rehabilitation for the exercise;Relaxation education    Continue Psychosocial Services   No Follow up required       Vocational Rehabilitation: Provide vocational rehab assistance  to qualifying candidates.   Vocational Rehab Evaluation & Intervention: Vocational Rehab - 11/13/18 1428      Initial Vocational Rehab Evaluation & Intervention   Assessment shows need for Vocational Rehabilitation  No      Vocational Rehab Re-Evaulation   Comments  Patient is retired and not interested in returning to work.       Education: Education Goals: Education classes will be provided on a weekly basis, covering required topics. Participant will state understanding/return demonstration of topics presented.  Learning Barriers/Preferences: Learning Barriers/Preferences - 11/13/18 1429      Learning Barriers/Preferences   Learning Barriers  None    Learning Preferences  Skilled Demonstration       Education Topics: Hypertension, Hypertension Reduction -Define heart disease and high blood pressure. Discus how high blood pressure affects the body and ways to reduce high blood pressure.   Exercise and Your Heart -Discuss why it is important to exercise, the FITT principles of exercise, normal and abnormal responses to exercise, and how to exercise safely.   Angina -Discuss definition of angina, causes of angina, treatment of angina, and how to decrease risk of having angina.   Cardiac Medications -Review what the following cardiac medications are used for, how they affect the body, and side effects that may occur when taking the medications.  Medications include Aspirin, Beta blockers, calcium channel blockers, ACE Inhibitors, angiotensin receptor blockers, diuretics, digoxin, and antihyperlipidemics.   Congestive Heart Failure -Discuss the definition of CHF, how to live with CHF, the signs and symptoms of  CHF, and how keep track of weight and sodium intake.   Heart Disease and Intimacy -Discus the effect sexual activity has on the heart, how changes occur during intimacy as we age, and safety during sexual activity.   CARDIAC REHAB PHASE II EXERCISE from 12/25/2018  in [place]  Date  11/20/18  Educator  D. Coad  Instruction Review Code  2- Demonstrated Understanding      Smoking Cessation / COPD -Discuss different methods to quit smoking, the health benefits of quitting smoking, and the definition of COPD.   CARDIAC REHAB PHASE II EXERCISE from 12/25/2018 in [place]  Date  11/27/18  Educator  DJ  Instruction Review Code  2- Demonstrated Understanding      Nutrition I: Fats -Discuss the types of cholesterol, what cholesterol does to the heart, and how cholesterol levels can be controlled.   CARDIAC REHAB PHASE II EXERCISE from 12/25/2018 in [place]  Date  12/04/18  Educator  DC  Instruction Review Code  2- Demonstrated Understanding      Nutrition II: Labels -Discuss the different components of food labels and how to read food label   CARDIAC REHAB PHASE II EXERCISE from 12/25/2018 in [place]  Date  12/11/18  Educator  DJ  Instruction Review Code  2- Demonstrated Understanding      Heart Parts/Heart Disease and PAD -Discuss the anatomy of the heart, the pathway of blood circulation through the heart, and these are affected by heart disease.   CARDIAC REHAB PHASE II EXERCISE from 12/25/2018 in [place]  Date  12/18/18  Educator  DJ  Instruction Review Code  2- Demonstrated Understanding      Stress I: Signs and Symptoms -Discuss the causes of stress, how stress may lead to anxiety and depression, and ways to limit stress.   CARDIAC REHAB PHASE II EXERCISE from 12/25/2018 in [place]  Date  12/25/18  Educator  DJ  Instruction Review Code  2- Demonstrated Understanding      Stress II: Relaxation -Discuss different types of relaxation techniques to limit stress.   Warning Signs of Stroke / TIA -Discuss definition of a stroke, what the signs and symptoms are of a stroke, and how to  identify when someone is having stroke.   Knowledge Questionnaire Score: Knowledge Questionnaire Score - 11/13/18 1428      Knowledge Questionnaire Score   Pre Score  23/24       Core Components/Risk Factors/Patient Goals at Admission: Personal Goals and Risk Factors at Admission - 11/13/18 1433      Core Components/Risk Factors/Patient Goals on Admission    Weight Management  Weight Maintenance    Personal Goal Other  Yes    Personal Goal  Get back to normal ADL's; Do what she did before her surgery.    Intervention  Patient will attend CR 3 days/week and will supplement with exercise at home 2 days/week.    Expected Outcomes  Patient will meet her personal goals.       Core Components/Risk Factors/Patient Goals Review:  Goals and Risk Factor Review    Row Name 12/09/18 1446 12/30/18 1452           Core Components/Risk Factors/Patient Goals Review   Personal Goals Review  Weight Management/Obesity;Hypertension Be able to do ADL's/Get back to where she was before surgery.  Weight Management/Obesity;Hypertension Be able to do ADL's; get back to where she was  before her surgery.      Review  Patient has completed 11 sessions losing 6 lbs since her orientation visit. She is doing well in the program with progressions. She has been evaluated by her pcp 2 times for hypertension. He started her on Losartan at 25 mg daily and increased the dose 2 weeks later to 50 mg daily. Her blood pressure today was 140/84 initially and she reports 120/84 before she left home. She reports no side effects from the medication. She says she is feeling better and stronger and is able to do more of her activities. She enjoys coming to the program and feels she is meeting her goals.  Patient has completed 19 sessions gaining 2 lbs since last 30 day review. She continues to do well in the program with progression. She states she is feeling stronger and her blood pressue is improving after medication adjustments.  She is getting back to where she was before her surgery and is able to do more of her ADL's. She is pleased with her progress in the program. Will continue to monitor for progress.      Expected Outcomes  Patient will continue to attend sessions and complete the program meeting her personal goals.  Patient will continue to attend sessions and complete the program meeting her personal goals.         Core Components/Risk Factors/Patient Goals at Discharge (Final Review):  Goals and Risk Factor Review - 12/30/18 1452      Core Components/Risk Factors/Patient Goals Review   Personal Goals Review  Weight Management/Obesity;Hypertension   Be able to do ADL's; get back to where she was before her surgery.   Review  Patient has completed 19 sessions gaining 2 lbs since last 30 day review. She continues to do well in the program with progression. She states she is feeling stronger and her blood pressue is improving after medication adjustments. She is getting back to where she was before her surgery and is able to do more of her ADL's. She is pleased with her progress in the program. Will continue to monitor for progress.    Expected Outcomes  Patient will continue to attend sessions and complete the program meeting her personal goals.       ITP Comments: ITP Comments    Row Name 11/18/18 1436           ITP Comments  Patient is new to the program. She has completed 2 sessions. Will continue to monitor for progress.          Comments: ITP REVIEW Pt is making expected progress toward Cardiac Rehab goals after completing 19 sessions. Recommend continued exercise, life style modification, education, and increased stamina and strength.

## 2018-12-30 NOTE — Telephone Encounter (Signed)
Hannah Roy will increase losartan and follow her BP's at cardiac rehab

## 2018-12-30 NOTE — Telephone Encounter (Signed)
It was normal when I saw her and thus did not increase the dosage. When does she take the 50 mg? If in the morning, have her take the 25 mg q pm.

## 2018-12-30 NOTE — Telephone Encounter (Signed)
Saw Dr.Owen today, her BP elevated, 190/100. Ms Gallas thought her Losartan was going to be increased to 25 mg am and 50 mg pm.Please advise

## 2019-01-01 ENCOUNTER — Ambulatory Visit (INDEPENDENT_AMBULATORY_CARE_PROVIDER_SITE_OTHER): Payer: Medicare Other | Admitting: *Deleted

## 2019-01-01 ENCOUNTER — Encounter (HOSPITAL_COMMUNITY)
Admission: RE | Admit: 2019-01-01 | Discharge: 2019-01-01 | Disposition: A | Discharge status: Home / Self Care | Payer: Medicare Other | Source: Ambulatory Visit | Attending: Cardiovascular Disease | Admitting: Cardiovascular Disease

## 2019-01-01 ENCOUNTER — Other Ambulatory Visit: Payer: Self-pay

## 2019-01-01 DIAGNOSIS — Z953 Presence of xenogenic heart valve: Secondary | ICD-10-CM

## 2019-01-01 DIAGNOSIS — I4819 Other persistent atrial fibrillation: Secondary | ICD-10-CM

## 2019-01-01 DIAGNOSIS — Z9889 Other specified postprocedural states: Secondary | ICD-10-CM | POA: Diagnosis not present

## 2019-01-01 DIAGNOSIS — Z5181 Encounter for therapeutic drug level monitoring: Secondary | ICD-10-CM | POA: Diagnosis not present

## 2019-01-01 LAB — POCT INR: INR: 1.9 — AB (ref 2.0–3.0)

## 2019-01-01 NOTE — Patient Instructions (Signed)
Increase coumadin to 2 tablets daily except 1 1/2 tablets on Sundays, Tuesdays and Thursdays Recheck in 3 wks

## 2019-01-01 NOTE — Progress Notes (Signed)
Daily Session Note  Patient Details  Name: Hannah Roy MRN: [id number] Date of Birth: 1940-04-26 Referring Provider:     CARDIAC REHAB PHASE II ORIENTATION from 11/13/2018 in [place]  Referring Provider  [name]       Encounter Date: 01/01/2019  Check In: Session Check In - 01/01/19 0815      Check-In   Supervising physician immediately available to respond to emergencies  See telemetry face sheet for immediately available MD    Location  [facility]    Staff Present  Russella Dar, MS, EP, [facility], Exercise Physiologist;Nolia Tschantz Zachery Conch, Exercise Physiologist;Debra Wynetta Emery, RN, BSN    Virtual Visit  No    Medication changes reported      No    Fall or balance concerns reported     No    Tobacco Cessation  No Change    Warm-up and Cool-down  Performed as group-led instruction    Resistance Training Performed  Yes    VAD Patient?  No    PAD/SET Patient?  No      Pain Assessment   Currently in Pain?  No/denies    Pain Score  0-No pain    Multiple Pain Sites  No       Capillary Blood Glucose: No results found for this or any previous visit (from the past 24 hour(s)).    Social History   Tobacco Use  Smoking Status Former Smoker  . Packs/day: 0.50  . Years: 10.00  . Pack years: 5.00  . Types: Cigarettes  Smokeless Tobacco Never Used    Goals Met:  Independence with exercise equipment Exercise tolerated well No report of cardiac concerns or symptoms Strength training completed today  Goals Unmet:  Not Applicable  Comments: Pt able to follow exercise prescription today without complaint.  Will continue to monitor for progression. Check out 0915.   Dr. Kate Sable is Medical Director for [facility] Cardiac and Pulmonary Rehab.

## 2019-01-03 ENCOUNTER — Other Ambulatory Visit: Payer: Self-pay

## 2019-01-03 ENCOUNTER — Encounter (HOSPITAL_COMMUNITY)
Admission: RE | Admit: 2019-01-03 | Discharge: 2019-01-03 | Disposition: A | Discharge status: Home / Self Care | Payer: Medicare Other | Source: Ambulatory Visit | Attending: Cardiovascular Disease | Admitting: Cardiovascular Disease

## 2019-01-03 DIAGNOSIS — Z953 Presence of xenogenic heart valve: Secondary | ICD-10-CM

## 2019-01-03 DIAGNOSIS — Z9889 Other specified postprocedural states: Secondary | ICD-10-CM

## 2019-01-03 NOTE — Progress Notes (Signed)
Daily Session Note  Patient Details  Name: Hannah Roy MRN: [id number] Date of Birth: 1940/12/14 Referring Provider:     CARDIAC REHAB PHASE II ORIENTATION from 11/13/2018 in [place]  Referring Provider  [name]       Encounter Date: 01/03/2019  Check In: Session Check In - 01/03/19 0815      Check-In   Supervising physician immediately available to respond to emergencies  See telemetry face sheet for immediately available MD    Location  [facility]    Staff Present  Benay Pike, Exercise Physiologist;Debra Wynetta Emery, RN, BSN    Virtual Visit  No    Medication changes reported      No    Fall or balance concerns reported     No    Tobacco Cessation  No Change    Warm-up and Cool-down  Performed as group-led instruction    Resistance Training Performed  Yes    VAD Patient?  No    PAD/SET Patient?  No      Pain Assessment   Currently in Pain?  No/denies    Pain Score  0-No pain    Multiple Pain Sites  No       Capillary Blood Glucose: No results found for this or any previous visit (from the past 24 hour(s)).    Social History   Tobacco Use  Smoking Status Former Smoker  . Packs/day: 0.50  . Years: 10.00  . Pack years: 5.00  . Types: Cigarettes  Smokeless Tobacco Never Used    Goals Met:  Independence with exercise equipment Exercise tolerated well No report of cardiac concerns or symptoms Strength training completed today  Goals Unmet:  Not Applicable  Comments: Pt able to follow exercise prescription today without complaint.  Will continue to monitor for progression. Check out 0915.   Dr. Kate Sable is Medical Director for [facility] Cardiac and Pulmonary Rehab.

## 2019-01-06 ENCOUNTER — Encounter (HOSPITAL_COMMUNITY)
Admission: RE | Admit: 2019-01-06 | Discharge: 2019-01-06 | Disposition: A | Discharge status: Home / Self Care | Payer: Medicare Other | Source: Ambulatory Visit | Attending: Cardiovascular Disease | Admitting: Cardiovascular Disease

## 2019-01-06 ENCOUNTER — Other Ambulatory Visit: Payer: Self-pay

## 2019-01-06 DIAGNOSIS — Z953 Presence of xenogenic heart valve: Secondary | ICD-10-CM

## 2019-01-06 DIAGNOSIS — Z9889 Other specified postprocedural states: Secondary | ICD-10-CM

## 2019-01-06 NOTE — Progress Notes (Signed)
Daily Session Note  Patient Details  Name: Hannah Roy MRN: [id number] Date of Birth: 09-04-1940 Referring Provider:     [place] from 11/13/2018 in [place]  Referring Provider  [name]       Encounter Date: 01/06/2019  Check In: Session Check In - 01/06/19 0815      Check-In   Supervising physician immediately available to respond to emergencies  See telemetry face sheet for immediately available MD    Location  [facility]    Staff Present  Benay Pike, Exercise Physiologist;Debra Wynetta Emery, RN, BSN;Diane Coad, MS, EP, [facility], Exercise Physiologist    Virtual Visit  No    Medication changes reported      No    Fall or balance concerns reported     No    Tobacco Cessation  No Change    Warm-up and Cool-down  Performed as group-led instruction    Resistance Training Performed  Yes    VAD Patient?  No    PAD/SET Patient?  No      Pain Assessment   Currently in Pain?  No/denies    Pain Score  0-No pain    Multiple Pain Sites  No       Capillary Blood Glucose: No results found for this or any previous visit (from the past 24 hour(s)).    Social History   Tobacco Use  Smoking Status Former Smoker  . Packs/day: 0.50  . Years: 10.00  . Pack years: 5.00  . Types: Cigarettes  Smokeless Tobacco Never Used    Goals Met:  Independence with exercise equipment Exercise tolerated well No report of cardiac concerns or symptoms Strength training completed today  Goals Unmet:  Not Applicable  Comments: Pt able to follow exercise prescription today without complaint.  Will continue to monitor for progression. Check out 0915.   Dr. Kate Sable is Medical Director for [facility] Cardiac and Pulmonary Rehab.

## 2019-01-08 ENCOUNTER — Encounter (HOSPITAL_COMMUNITY)
Admission: RE | Admit: 2019-01-08 | Discharge: 2019-01-08 | Disposition: A | Discharge status: Home / Self Care | Payer: Medicare Other | Source: Ambulatory Visit | Attending: Cardiovascular Disease | Admitting: Cardiovascular Disease

## 2019-01-08 ENCOUNTER — Other Ambulatory Visit: Payer: Self-pay

## 2019-01-08 DIAGNOSIS — Z9889 Other specified postprocedural states: Secondary | ICD-10-CM | POA: Diagnosis not present

## 2019-01-08 DIAGNOSIS — Z953 Presence of xenogenic heart valve: Secondary | ICD-10-CM | POA: Diagnosis not present

## 2019-01-08 NOTE — Progress Notes (Signed)
Daily Session Note  Patient Details  Name: Hannah Roy MRN: [id number] Date of Birth: 1940-07-25 Referring Provider:     [place] from 11/13/2018 in [place]  Referring Provider  [name]       Encounter Date: 01/08/2019  Check In: Session Check In - 01/08/19 0815      Check-In   Supervising physician immediately available to respond to emergencies  See telemetry face sheet for immediately available MD    Location  [facility]    Staff Present  Benay Pike, Exercise Physiologist;Debra Wynetta Emery, RN, BSN;Diane Coad, MS, EP, [facility], Exercise Physiologist    Virtual Visit  No    Medication changes reported      No    Fall or balance concerns reported     No    Tobacco Cessation  No Change    Warm-up and Cool-down  Performed as group-led instruction    Resistance Training Performed  Yes    VAD Patient?  No    PAD/SET Patient?  No      Pain Assessment   Currently in Pain?  No/denies    Pain Score  0-No pain    Multiple Pain Sites  No       Capillary Blood Glucose: No results found for this or any previous visit (from the past 24 hour(s)).    Social History   Tobacco Use  Smoking Status Former Smoker  . Packs/day: 0.50  . Years: 10.00  . Pack years: 5.00  . Types: Cigarettes  Smokeless Tobacco Never Used    Goals Met:  Independence with exercise equipment Exercise tolerated well No report of cardiac concerns or symptoms Strength training completed today  Goals Unmet:  Not Applicable  Comments: Pt able to follow exercise prescription today without complaint.  Will continue to monitor for progression. Check out 0915.   Dr. Kate Sable is Medical Director for [facility] Cardiac and Pulmonary Rehab.

## 2019-01-10 ENCOUNTER — Other Ambulatory Visit: Payer: Self-pay

## 2019-01-10 ENCOUNTER — Encounter (HOSPITAL_COMMUNITY)
Admission: RE | Admit: 2019-01-10 | Discharge: 2019-01-10 | Disposition: A | Discharge status: Home / Self Care | Payer: Medicare Other | Source: Ambulatory Visit | Attending: Cardiovascular Disease | Admitting: Cardiovascular Disease

## 2019-01-10 DIAGNOSIS — Z953 Presence of xenogenic heart valve: Secondary | ICD-10-CM | POA: Insufficient documentation

## 2019-01-10 DIAGNOSIS — Z9889 Other specified postprocedural states: Secondary | ICD-10-CM | POA: Diagnosis not present

## 2019-01-10 NOTE — Progress Notes (Signed)
Daily Session Note  Patient Details  Name: Hannah Roy MRN: [id number] Date of Birth: September 12, 1940 Referring Provider:     [place] from 11/13/2018 in [place]  Referring Provider  [name]       Encounter Date: 01/10/2019  Check In: Session Check In - 01/10/19 0814      Check-In   Supervising physician immediately available to respond to emergencies  See telemetry face sheet for immediately available MD    Location  [facility]    Staff Present  Benay Pike, Exercise Physiologist;Debra Wynetta Emery, RN, BSN;Diane Coad, MS, EP, [facility], Exercise Physiologist    Virtual Visit  No    Medication changes reported      No    Fall or balance concerns reported     No    Tobacco Cessation  No Change    Warm-up and Cool-down  Performed as group-led instruction    Resistance Training Performed  Yes    VAD Patient?  No    PAD/SET Patient?  No      Pain Assessment   Currently in Pain?  No/denies    Pain Score  0-No pain    Multiple Pain Sites  No       Capillary Blood Glucose: No results found for this or any previous visit (from the past 24 hour(s)).    Social History   Tobacco Use  Smoking Status Former Smoker  . Packs/day: 0.50  . Years: 10.00  . Pack years: 5.00  . Types: Cigarettes  Smokeless Tobacco Never Used    Goals Met:  Independence with exercise equipment Exercise tolerated well No report of cardiac concerns or symptoms Strength training completed today  Goals Unmet:  Not Applicable  Comments: Pt able to follow exercise prescription today without complaint.  Will continue to monitor for progression. Check out 0915.   Dr. Kate Sable is Medical Director for [facility] Cardiac and Pulmonary Rehab.

## 2019-01-13 ENCOUNTER — Other Ambulatory Visit: Payer: Self-pay

## 2019-01-13 ENCOUNTER — Encounter (HOSPITAL_COMMUNITY)
Admission: RE | Admit: 2019-01-13 | Discharge: 2019-01-13 | Disposition: A | Discharge status: Home / Self Care | Payer: Medicare Other | Source: Ambulatory Visit | Attending: Cardiovascular Disease | Admitting: Cardiovascular Disease

## 2019-01-13 DIAGNOSIS — Z9889 Other specified postprocedural states: Secondary | ICD-10-CM | POA: Diagnosis not present

## 2019-01-13 DIAGNOSIS — Z953 Presence of xenogenic heart valve: Secondary | ICD-10-CM | POA: Diagnosis not present

## 2019-01-13 NOTE — Progress Notes (Signed)
Daily Session Note  Patient Details  Name: Hannah Roy MRN: [id number] Date of Birth: [birth year] Referring Provider:     [place] from 11/13/2018 in [place]  Referring Provider  [name]       Encounter Date: 01/13/2019  Check In: Session Check In - 01/13/19 0815      Check-In   Supervising physician immediately available to respond to emergencies  See telemetry face sheet for immediately available MD    Location  [facility]    Staff Present  Benay Pike, Exercise Physiologist;Debra Wynetta Emery, RN, BSN;Other    Virtual Visit  No    Medication changes reported      No    Fall or balance concerns reported     No    Tobacco Cessation  No Change    Warm-up and Cool-down  Performed as group-led instruction    Resistance Training Performed  Yes    VAD Patient?  No    PAD/SET Patient?  No      Pain Assessment   Currently in Pain?  No/denies    Pain Score  0-No pain    Multiple Pain Sites  No       Capillary Blood Glucose: No results found for this or any previous visit (from the past 24 hour(s)).    Social History   Tobacco Use  Smoking Status Former Smoker  . Packs/day: 0.50  . Years: 10.00  . Pack years: 5.00  . Types: Cigarettes  Smokeless Tobacco Never Used    Goals Met:  Independence with exercise equipment Exercise tolerated well No report of cardiac concerns or symptoms Strength training completed today  Goals Unmet:  Not Applicable  Comments: Pt able to follow exercise prescription today without complaint.  Will continue to monitor for progression. Check out 0915.   Dr. Kate Sable is Medical Director for [facility] Cardiac and Pulmonary Rehab.

## 2019-01-15 ENCOUNTER — Other Ambulatory Visit: Payer: Self-pay

## 2019-01-15 ENCOUNTER — Ambulatory Visit (INDEPENDENT_AMBULATORY_CARE_PROVIDER_SITE_OTHER): Payer: Medicare Other | Admitting: *Deleted

## 2019-01-15 ENCOUNTER — Encounter (HOSPITAL_COMMUNITY)
Admission: RE | Admit: 2019-01-15 | Discharge: 2019-01-15 | Disposition: A | Discharge status: Home / Self Care | Payer: Medicare Other | Source: Ambulatory Visit | Attending: Cardiovascular Disease | Admitting: Cardiovascular Disease

## 2019-01-15 DIAGNOSIS — Z9889 Other specified postprocedural states: Secondary | ICD-10-CM | POA: Diagnosis not present

## 2019-01-15 DIAGNOSIS — I4819 Other persistent atrial fibrillation: Secondary | ICD-10-CM | POA: Diagnosis not present

## 2019-01-15 DIAGNOSIS — Z953 Presence of xenogenic heart valve: Secondary | ICD-10-CM | POA: Diagnosis not present

## 2019-01-15 DIAGNOSIS — Z23 Encounter for immunization: Secondary | ICD-10-CM | POA: Diagnosis not present

## 2019-01-15 DIAGNOSIS — Z5181 Encounter for therapeutic drug level monitoring: Secondary | ICD-10-CM | POA: Diagnosis not present

## 2019-01-15 DIAGNOSIS — I4891 Unspecified atrial fibrillation: Secondary | ICD-10-CM

## 2019-01-15 LAB — POCT INR: INR: 2.9 (ref 2.0–3.0)

## 2019-01-15 NOTE — Progress Notes (Signed)
Daily Session Note  Patient Details  Name: PAMLA PANGLE MRN: [id number] Date of Birth: 03/19/1941 Referring Provider:     CARDIAC REHAB PHASE II ORIENTATION from 11/13/2018 in [place]  Referring Provider  [name]       Encounter Date: 01/15/2019  Check In: Session Check In - 01/15/19 0815      Check-In   Supervising physician immediately available to respond to emergencies  See telemetry face sheet for immediately available MD    Location  [facility]    Staff Present  Russella Dar, MS, EP, [facility], Exercise Physiologist;Debra Wynetta Emery, RN, BSN    Virtual Visit  No    Medication changes reported      No    Fall or balance concerns reported     No    Tobacco Cessation  No Change    Warm-up and Cool-down  Performed as group-led instruction    Resistance Training Performed  Yes    VAD Patient?  No    PAD/SET Patient?  No      Pain Assessment   Currently in Pain?  No/denies    Pain Score  0-No pain    Multiple Pain Sites  No       Capillary Blood Glucose: No results found for this or any previous visit (from the past 24 hour(s)).    Social History   Tobacco Use  Smoking Status Former Smoker  . Packs/day: 0.50  . Years: 10.00  . Pack years: 5.00  . Types: Cigarettes  Smokeless Tobacco Never Used    Goals Met:  Independence with exercise equipment Exercise tolerated well Personal goals reviewed No report of cardiac concerns or symptoms Strength training completed today  Goals Unmet:  Not Applicable  Comments: Check out: 0915   Dr. Kate Sable is Medical Director for [place] and Pulmonary Rehab.

## 2019-01-15 NOTE — Patient Instructions (Signed)
Continue warfarin 2 tablets daily except 1 1/2 tablets on Sundays, Tuesdays and Thursdays Recheck in 3 wks Can add in 1 small salad /week with iceburg lettuce and 1/2 cup broccoli,turnips or spinich/wk

## 2019-01-17 ENCOUNTER — Encounter (HOSPITAL_COMMUNITY)
Admission: RE | Admit: 2019-01-17 | Discharge: 2019-01-17 | Disposition: A | Discharge status: Home / Self Care | Payer: Medicare Other | Source: Ambulatory Visit | Attending: Cardiovascular Disease | Admitting: Cardiovascular Disease

## 2019-01-17 ENCOUNTER — Other Ambulatory Visit: Payer: Self-pay

## 2019-01-17 DIAGNOSIS — Z9889 Other specified postprocedural states: Secondary | ICD-10-CM | POA: Diagnosis not present

## 2019-01-17 DIAGNOSIS — Z953 Presence of xenogenic heart valve: Secondary | ICD-10-CM

## 2019-01-17 NOTE — Progress Notes (Signed)
Daily Session Note  Patient Details  Name: Hannah Roy MRN: [id number] Date of Birth: 11-15-1940 Referring Provider:     CARDIAC REHAB PHASE II ORIENTATION from 11/13/2018 in [place]  Referring Provider  [name]       Encounter Date: 01/17/2019  Check In: Session Check In - 01/17/19 0831      Check-In   Supervising physician immediately available to respond to emergencies  See telemetry face sheet for immediately available MD    Location  [facility]    Staff Present  Russella Dar, MS, EP, [facility], Exercise Physiologist;Debra Wynetta Emery, RN, BSN    Virtual Visit  No    Medication changes reported      No    Fall or balance concerns reported     No    Tobacco Cessation  No Change    Warm-up and Cool-down  Performed as group-led instruction    Resistance Training Performed  Yes    VAD Patient?  No    PAD/SET Patient?  No      Pain Assessment   Currently in Pain?  No/denies    Pain Score  0-No pain    Multiple Pain Sites  No       Capillary Blood Glucose: No results found for this or any previous visit (from the past 24 hour(s)).    Social History   Tobacco Use  Smoking Status Former Smoker  . Packs/day: 0.50  . Years: 10.00  . Pack years: 5.00  . Types: Cigarettes  Smokeless Tobacco Never Used    Goals Met:  Independence with exercise equipment Exercise tolerated well Personal goals reviewed No report of cardiac concerns or symptoms Strength training completed today  Goals Unmet:  Not Applicable  Comments: Check out: 0915   Dr. Kate Sable is Medical Director for [place] and Pulmonary Rehab.

## 2019-01-20 ENCOUNTER — Other Ambulatory Visit: Payer: Self-pay

## 2019-01-20 ENCOUNTER — Encounter (HOSPITAL_COMMUNITY)
Admission: RE | Admit: 2019-01-20 | Discharge: 2019-01-20 | Disposition: A | Discharge status: Home / Self Care | Payer: Medicare Other | Source: Ambulatory Visit | Attending: Cardiovascular Disease | Admitting: Cardiovascular Disease

## 2019-01-20 DIAGNOSIS — Z953 Presence of xenogenic heart valve: Secondary | ICD-10-CM | POA: Diagnosis not present

## 2019-01-20 DIAGNOSIS — Z9889 Other specified postprocedural states: Secondary | ICD-10-CM

## 2019-01-20 NOTE — Progress Notes (Signed)
Daily Session Note  Patient Details  Name: Hannah Roy MRN: [id number] Date of Birth: 09/12/1940 Referring Provider:     CARDIAC REHAB PHASE II ORIENTATION from 11/13/2018 in [place] CARDIAC REHABILITATION  Referring Provider  Koneswaran       Encounter Date: 01/20/2019  Check In: Session Check In - 01/20/19 0854      Check-In   Supervising physician immediately available to respond to emergencies  See telemetry face sheet for immediately available MD    Location  [facility]    Staff Present  Diane Coad, MS, EP, [facility], Exercise Physiologist;Debra Johnson, RN, BSN    Virtual Visit  No    Medication changes reported      No    Fall or balance concerns reported     No    Tobacco Cessation  No Change    Warm-up and Cool-down  Performed as group-led instruction    Resistance Training Performed  Yes    VAD Patient?  No    PAD/SET Patient?  No      Pain Assessment   Currently in Pain?  No/denies    Pain Score  0-No pain    Multiple Pain Sites  No       Capillary Blood Glucose: No results found for this or any previous visit (from the past 24 hour(s)).    Social History   Tobacco Use  Smoking Status Former Smoker  . Packs/day: 0.50  . Years: 10.00  . Pack years: 5.00  . Types: Cigarettes  Smokeless Tobacco Never Used    Goals Met:  Independence with exercise equipment Exercise tolerated well Personal goals reviewed No report of cardiac concerns or symptoms Strength training completed today  Goals Unmet:  Not Applicable  Comments: Check out: 0915   Dr. Suresh Koneswaran is Medical Director for [place] Cardiac and Pulmonary Rehab. 

## 2019-01-22 ENCOUNTER — Encounter (HOSPITAL_COMMUNITY)
Admission: RE | Admit: 2019-01-22 | Discharge: 2019-01-22 | Disposition: A | Discharge status: Home / Self Care | Payer: Medicare Other | Source: Ambulatory Visit | Attending: Cardiovascular Disease | Admitting: Cardiovascular Disease

## 2019-01-22 ENCOUNTER — Other Ambulatory Visit: Payer: Self-pay

## 2019-01-22 DIAGNOSIS — E785 Hyperlipidemia, unspecified: Secondary | ICD-10-CM | POA: Diagnosis not present

## 2019-01-22 DIAGNOSIS — Z79899 Other long term (current) drug therapy: Secondary | ICD-10-CM | POA: Diagnosis not present

## 2019-01-22 DIAGNOSIS — Z953 Presence of xenogenic heart valve: Secondary | ICD-10-CM

## 2019-01-22 DIAGNOSIS — Z9889 Other specified postprocedural states: Secondary | ICD-10-CM

## 2019-01-22 NOTE — Progress Notes (Signed)
Cardiac Individual Treatment Plan  Patient Details  Name: Hannah Roy MRN: [id number] Date of Birth: [birth year] Referring Provider:     CARDIAC REHAB PHASE II ORIENTATION from 11/13/2018 in [place]  Referring Provider  [name]       Initial Encounter Date:    CARDIAC REHAB PHASE II ORIENTATION from 11/13/2018 in [place]  Date  11/13/18      Visit Diagnosis: S/P aortic valve replacement with bioprosthetic valve  S/P tricuspid valve repair  Patient's Home Medications on Admission:  Current Outpatient Medications:  .  losartan (COZAAR) 50 MG tablet, Take 50 mg am and 25 mg pm, Disp: 135 tablet, Rfl: 3 .  OVER THE COUNTER MEDICATION, Stool softner, Disp: , Rfl:  .  pantoprazole (PROTONIX) 40 MG tablet, Take 1 tablet (40 mg total) by mouth daily before breakfast., Disp: 30 tablet, Rfl: 11 .  rosuvastatin (CRESTOR) 5 MG tablet, Take one tablet on Monday and fridays., Disp: 8 tablet, Rfl: 6 .  warfarin (COUMADIN) 2.5 MG tablet, Take as directed 1.5 or 2 tablets daily follow current instructions, Disp: 60 tablet, Rfl: 1  Past Medical History: Past Medical History:  Diagnosis Date  . Aortic atherosclerosis ([place])   . Aortic insufficiency, rheumatic   . 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 Atrial fibrillation ([place]) 05/10/2018  . Colon polyps   . Essential hypertension, benign   . History of seasonal allergies   . Hypertension   . Hypothyroidism   . Mitral stenosis with regurgitation, rheumatic   . Osteopenia 2006  . Persistent atrial fibrillation   . 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   21 mm [facility] Ease stented bovine pericardial tissue valve  . S/P ascending aortic aneurysm repair 09/05/2018   28 mm Hemashield platinum supracoronary straight graft  . S/P [place] operation for atrial fibrillation 09/05/2018   Complete bilateral atrial lesion set using bipolar radiofrequency and cryothermy ablation  with clipping of LA appendage  . 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   29 mm [facility] Mitral stented bovine pericardial tissue valve  . 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 Tricuspid regurgitation     Tobacco Use: Social History   Tobacco Use  Smoking Status Former Smoker  . Packs/day: 0.50  . Years: 10.00  . Pack years: 5.00  . Types: Cigarettes  Smokeless Tobacco Never Used    Labs: Recent Review Flowsheet Data    Labs for ITP Cardiac and Pulmonary Rehab Latest Ref Rng & Units 09/05/2018 09/05/2018 09/06/2018 09/06/2018 11/27/2018   Cholestrol 100 - 199 mg/dL - - - - 197   LDLCALC 0 - 99 mg/dL - - - - 125(H)   HDL >39 mg/dL - - - - 53   Trlycerides 0 - 149 mg/dL - - - - 95   Hemoglobin A1c 4.8 - 5.6 % - - - - -   PHART 7.350 - 7.450 7.365 - 7.357 7.367 -   PCO2ART 32.0 - 48.0 mmHg 39.0 - 37.6 38.9 -   HCO3 20.0 - 28.0 mmol/L 22.5 - 21.0 22.3 -   TCO2 22 - 32 mmol/L 24 22 22 23  -   ACIDBASEDEF 0.0 - 2.0 mmol/L 3.0(H) - 4.0(H) 3.0(H) -   O2SAT % 100.0 - 100.0 99.0 -      Capillary Blood Glucose: Lab Results  Component Value Date   GLUCAP 89 09/09/2018   GLUCAP 93  09/08/2018   GLUCAP 110 (H) 09/08/2018   GLUCAP 121 (H) 09/08/2018   GLUCAP 124 (H) 09/08/2018     Exercise Target Goals: Exercise Program Goal: Individual exercise prescription set using results from initial 6 min walk test and THRR while considering  patient's activity barriers and safety.   Exercise Prescription Goal: Starting with aerobic activity 30 plus minutes a day, 3 days per week for initial exercise prescription. Provide home exercise prescription and guidelines that participant acknowledges understanding prior to discharge.  Activity Barriers & Risk Stratification: Activity Barriers & Cardiac Risk Stratification - 11/13/18 1346      Activity Barriers & Cardiac Risk Stratification   Activity Barriers  None    Cardiac  Risk Stratification  High       6 Minute Walk: 6 Minute Walk    Row Name 11/13/18 1344         6 Minute Walk   Phase  Initial     Distance  1200 feet     Walk Time  6 minutes     # of Rest Breaks  0     MPH  2.27     METS  2.74     RPE  9     Perceived Dyspnea   9     VO2 Peak  9.4     Symptoms  No     Resting HR  75 bpm     Resting BP  160/84     Resting Oxygen Saturation   98 %     Exercise Oxygen Saturation  during 6 min walk  96 %     Max Ex. HR  95 bpm     Max Ex. BP  166/80     2 Minute Post BP  148/80        Oxygen Initial Assessment:   Oxygen Re-Evaluation:   Oxygen Discharge (Final Oxygen Re-Evaluation):   Initial Exercise Prescription: Initial Exercise Prescription - 11/13/18 1300      Date of Initial Exercise RX and Referring Provider   Date  11/13/18    Referring Provider  [facility]     Expected Discharge Date  02/13/19      Treadmill   MPH  1.3    Grade  0    Minutes  17    METs  1.99      NuStep   Level  1    SPM  60    Minutes  17    METs  1.6      Prescription Details   Frequency (times per week)  3    Duration  Progress to 30 minutes of continuous aerobic without signs/symptoms of physical distress      Intensity   THRR 40-80% of Max Heartrate  [phone]    Ratings of Perceived Exertion  11-13    Perceived Dyspnea  0-4      Progression   Progression  Continue to progress workloads to maintain intensity without signs/symptoms of physical distress.      Resistance Training   Training Prescription  Yes    Weight  1    Reps  10-15       Perform Capillary Blood Glucose checks as needed.  Exercise Prescription Changes:  Exercise Prescription Changes    Row Name 11/13/18 1300 12/05/18 1200 12/30/18 1300 01/14/19 1300       Response to Exercise   Blood Pressure (Admit)  160/84  168/84  180/100  150/86    Blood  Pressure (Exercise)  166/80  190/102  192/104  158/80    Blood Pressure (Exit)  148/80  152/80  160/88   134/82    Heart Rate (Admit)  75 bpm  84 bpm  80 bpm  84 bpm    Heart Rate (Exercise)  95 bpm  107 bpm  125 bpm  130 bpm    Heart Rate (Exit)  85 bpm  93 bpm  103 bpm  107 bpm    Oxygen Saturation (Admit)  98 %  -  -  -    Oxygen Saturation (Exercise)  96 %  -  -  -    Oxygen Saturation (Exit)  99 %  -  -  -    Rating of Perceived Exertion (Exercise)  9  -  12  12    Perceived Dyspnea (Exercise)  7  -  -  -    Symptoms  none  -  -  -    Comments  6 minute walk tes t  first two weeks of exercise   increase in overall MET level   increase in overall MET level     Duration  Progress to 30 minutes of  aerobic without signs/symptoms of physical distress  Continue with 30 min of aerobic exercise without signs/symptoms of physical distress.  Continue with 30 min of aerobic exercise without signs/symptoms of physical distress.  Continue with 30 min of aerobic exercise without signs/symptoms of physical distress.    Intensity  THRR New [phone]  THRR unchanged  THRR unchanged  THRR unchanged      Resistance Training   Training Prescription  -  Yes  Yes  Yes    Weight  -  2  3  3     Reps  -  10-15  10-15  10-15      Treadmill   MPH  -  1.6  2.5  2.8    Grade  -  0  0  0    Minutes  -  17  17  17     METs  -  2.22  2.91  3.14      NuStep   Level  -  2  2  3     SPM  -  105  128  129    Minutes  -  22  22  22     METs  -  2.3  3.2  4      Home Exercise Plan   Plans to continue exercise at  Home (comment)  Home (comment)  Home (comment)  Home (comment)    Frequency  Add 2 additional days to program exercise sessions.  Add 2 additional days to program exercise sessions.  Add 2 additional days to program exercise sessions.  Add 2 additional days to program exercise sessions.    Initial Home Exercises Provided  11/13/18  11/13/18  11/13/18  11/13/18       Exercise Comments:  Exercise Comments    Row Name 11/18/18 1413 12/05/18 1229 12/09/18 1359 12/30/18 1339 01/14/19 1302   Exercise Comments   Pt. is new to the program. She has attended 2 sessions so far and has tolerated the exercise well.  Pt. has done extremely well in CR. She comes everyday with the best attitude and is ready to exercise. She enjoys being here and has been tolerating the exercise well.  Pt. is regularly attending Cardiac Rehab. She Come ready to work and is eager to increase  her work loads. Her BP is finally starting to stablize so we were able to bump her to 1.9 MPH on the TM.  Pt. has now attended 19 exercise sessions. She has tolerated all increases with ease and even asks to be bumped up in the TM regularly. She is is now walking at 2.5MPH and will be on level 3 on the NS at her next session.  Pt. is doing great in CR. She has attended 25 sessions and comes every day ready to work hard to increase her physical strength as well as her heart strength.   [place] Name 01/21/19 1252           Exercise Comments  Hannah Roy continues to work hard with every session she attends. She is always working to beat her previous distance on the NuStep and is asking all the time for [place] to increase her speed on the Treadmill. She states she likes oushing herself and working hard because she can tell how it makes her feel better.          Exercise Goals and Review:  Exercise Goals    Row Name 11/13/18 1348             Exercise Goals   Increase Physical Activity  Yes       Intervention  Provide advice, education, support and counseling about physical activity/exercise needs.;Develop an individualized exercise prescription for aerobic and resistive training based on initial evaluation findings, risk stratification, comorbidities and participant's personal goals.       Expected Outcomes  Short Term: Attend rehab on a regular basis to increase amount of physical activity.;Long Term: Add in home exercise to make exercise part of routine and to increase amount of physical activity.;Long Term: Exercising regularly at least 3-5 days a week.        Increase Strength and Stamina  Yes       Intervention  Provide advice, education, support and counseling about physical activity/exercise needs.;Develop an individualized exercise prescription for aerobic and resistive training based on initial evaluation findings, risk stratification, comorbidities and participant's personal goals.       Expected Outcomes  Short Term: Increase workloads from initial exercise prescription for resistance, speed, and METs.;Short Term: Perform resistance training exercises routinely during rehab and add in resistance training at home;Long Term: Improve cardiorespiratory fitness, muscular endurance and strength as measured by increased METs and functional capacity (6MWT)       Able to understand and use rate of perceived exertion (RPE) scale  Yes       Intervention  Provide education and explanation on how to use RPE scale       Expected Outcomes  Short Term: Able to use RPE daily in rehab to express subjective intensity level;Long Term:  Able to use RPE to guide intensity level when exercising independently       Knowledge and understanding of Target Heart Rate Range (THRR)  Yes       Intervention  Provide education and explanation of THRR including how the numbers were predicted and where they are located for reference       Expected Outcomes  Short Term: Able to state/look up THRR;Long Term: Able to use THRR to govern intensity when exercising independently;Short Term: Able to use daily as guideline for intensity in rehab       Able to check pulse independently  Yes       Intervention  Provide education and demonstration on how to check pulse in  carotid and radial arteries.;Review the importance of being able to check your own pulse for safety during independent exercise       Expected Outcomes  Short Term: Able to explain why pulse checking is important during independent exercise;Long Term: Able to check pulse independently and accurately       Understanding of  Exercise Prescription  Yes       Intervention  Provide education, explanation, and written materials on patient's individual exercise prescription       Expected Outcomes  Short Term: Able to explain program exercise prescription;Long Term: Able to explain home exercise prescription to exercise independently          Exercise Goals Re-Evaluation : Exercise Goals Re-Evaluation    [place] Name 11/18/18 1413 12/09/18 1358 12/30/18 1339 01/21/19 1249       Exercise Goal Re-Evaluation   Exercise Goals Review  Increase Physical Activity;Increase Strength and Stamina;Able to understand and use rate of perceived exertion (RPE) scale;Knowledge and understanding of Target Heart Rate Range (THRR);Able to check pulse independently;Understanding of Exercise Prescription  Increase Physical Activity;Increase Strength and Stamina;Able to understand and use rate of perceived exertion (RPE) scale;Knowledge and understanding of Target Heart Rate Range (THRR);Able to check pulse independently;Understanding of Exercise Prescription  Increase Physical Activity;Increase Strength and Stamina;Able to understand and use rate of perceived exertion (RPE) scale;Knowledge and understanding of Target Heart Rate Range (THRR);Able to check pulse independently;Understanding of Exercise Prescription  Increase Physical Activity;Increase Strength and Stamina;Able to understand and use rate of perceived exertion (RPE) scale;Knowledge and understanding of Target Heart Rate Range (THRR);Able to check pulse independently;Understanding of Exercise Prescription    Comments  Pt. is following exercise prescription to help her reach her goals.  Pt. has attended regularly. She enjoys coming to rehab and is working to increase her strength  Pt. continues to come to rehab every session. She is doing great and says she really enjoys the program. Pt. states it has helped her to increase her strength and stamina.  Hannah Roy continues to do extremely well in  Cardiac Rehab. She still works hard every session to get better and increase her strength. She is always lazor on her exercise and bettering herself.    Expected Outcomes  Short: increase overall activity level Long: develop exercise routine for home  Short: return to ADLs Long: get back to pre-surgery strength  Short: return to ADLs Long: get back to pre-surgery strength  Short: continue to increase activity level. Long: develop long term exercise routine        Discharge Exercise Prescription (Final Exercise Prescription Changes): Exercise Prescription Changes - 01/14/19 1300      Response to Exercise   Blood Pressure (Admit)  150/86    Blood Pressure (Exercise)  158/80    Blood Pressure (Exit)  134/82    Heart Rate (Admit)  84 bpm    Heart Rate (Exercise)  130 bpm    Heart Rate (Exit)  107 bpm    Rating of Perceived Exertion (Exercise)  12    Comments  increase in overall MET level     Duration  Continue with 30 min of aerobic exercise without signs/symptoms of physical distress.    Intensity  THRR unchanged      Resistance Training   Training Prescription  Yes    Weight  3    Reps  10-15      Treadmill   MPH  2.8    Grade  0    Minutes  17    METs  3.14      NuStep   Level  3    SPM  129    Minutes  22    METs  4      Home Exercise Plan   Plans to continue exercise at  Home (comment)    Frequency  Add 2 additional days to program exercise sessions.    Initial Home Exercises Provided  11/13/18       Nutrition:  Target Goals: Understanding of nutrition guidelines, daily intake of sodium <154m, cholesterol <2056m calories 30% from fat and 7% or less from saturated fats, daily to have 5 or more servings of fruits and vegetables.  Biometrics: Pre Biometrics - 11/13/18 1348      Pre Biometrics   Height  5' 1"  (1.549 m)    Waist Circumference  28 inches    Hip Circumference  34 inches    Waist to Hip Ratio  0.82 %    Triceps Skinfold  8 mm    % Body Fat  28.7  %    Grip Strength  5.7 kg    Single Leg Stand  37 seconds        Nutrition Therapy Plan and Nutrition Goals: Nutrition Therapy & Goals - 01/22/19 1510      Personal Nutrition Goals   Comments  We have not resumed out RD class post COVID-19. We are working on resuming classes. In the interim, we are providing education through hand-outs.  She continues to say she is eating heart healthy. She also states she is not eating green leafy foods due to anti-coagulant treatment.  Will continue to monitor.      Intervention Plan   Intervention  Nutrition handout(s) given to patient.    Expected Outcomes  Short Term Goal: Understand basic principles of dietary content, such as calories, fat, sodium, cholesterol and nutrients.       Nutrition Assessments: Nutrition Assessments - 11/13/18 1429      MEDFICTS Scores   Pre Score  21       Nutrition Goals Re-Evaluation:   Nutrition Goals Discharge (Final Nutrition Goals Re-Evaluation):   Psychosocial: Target Goals: Acknowledge presence or absence of significant depression and/or stress, maximize coping skills, provide positive support system. Participant is able to verbalize types and ability to use techniques and skills needed for reducing stress and depression.  Initial Review & Psychosocial Screening: Initial Psych Review & Screening - 11/13/18 1429      Initial Review   Current issues with  None Identified      Family Dynamics   Good Support System?  Yes      Barriers   Psychosocial barriers to participate in program  There are no identifiable barriers or psychosocial needs.      Screening Interventions   Interventions  Encouraged to exercise;Provide feedback about the scores to participant    Expected Outcomes  Long Term goal: The participant improves quality of Life and PHQ9 Scores as seen by post scores and/or verbalization of changes       Quality of Life Scores: Quality of Life - 11/13/18 1349      Quality of Life    Select  Quality of Life      Quality of Life Scores   Health/Function Pre  21.5 %    Socioeconomic Pre  24 %    Psych/Spiritual Pre  24 %    Family Pre  22.8 %    GLOBAL Pre  22.72 %      Scores of 19 and below usually indicate a poorer quality of life in these areas.  A difference of  2-3 points is a clinically meaningful difference.  A difference of 2-3 points in the total score of the Quality of Life Index has been associated with significant improvement in overall quality of life, self-image, physical symptoms, and general health in studies assessing change in quality of life.  PHQ-9: Recent Review Flowsheet Data    Depression screen [facility] 2/9 11/13/2018 05/02/2017 01/12/2015 10/17/2013   Decreased Interest 0 0 0 0   Down, Depressed, Hopeless 0 0 0 0   PHQ - 2 Score 0 0 0 0   Altered sleeping 1 1 - -   Tired, decreased energy 1 2 - -   Change in appetite 0 0 - -   Feeling bad or failure about yourself  0 0 - -   Trouble concentrating 0 0 - -   Moving slowly or fidgety/restless 0 1 - -   Suicidal thoughts 0 0 - -   PHQ-9 Score 2 4 - -   Difficult doing work/chores Not difficult at all - - -     Interpretation of Total Score  Total Score Depression Severity:  1-4 = Minimal depression, 5-9 = Mild depression, 10-14 = Moderate depression, 15-19 = Moderately severe depression, 20-27 = Severe depression   Psychosocial Evaluation and Intervention: Psychosocial Evaluation - 11/13/18 1430      Psychosocial Evaluation & Interventions   Interventions  Encouraged to exercise with the program and follow exercise prescription;Stress management education;Relaxation education    Comments  Patient has no psychosocial issues identified. Her initial QOL score was 22.72 and her PHQ-9 score was 2.    Expected Outcomes  Patient will have no psychosoical issues identified at discharge.    Continue Psychosocial Services   No Follow up required       Psychosocial Re-Evaluation: Psychosocial  Re-Evaluation    [place] Name 12/09/18 1451 12/30/18 1455 01/22/19 1514         Psychosocial Re-Evaluation   Current issues with  None Identified  None Identified  Current Stress Concerns     Comments  Patient's initial QOL socre was 22.72 and her PHQ-9 score was 2 with no psychosocial issues identified.  Patient's initial QOL socre was 22.72 and her PHQ-9 score was 2 with no psychosocial issues identified.  Patient's initial QOL socre was 22.72 and her PHQ-9 score was 2 with no psychosocial issues identified.     Expected Outcomes  Patient will have no psychosocial issues identified at discharge.  Patient will have no psychosocial issues identified at discharge.  Patient will have no psychosocial issues identified at discharge.     Interventions  Stress management education;Encouraged to attend Cardiac Rehabilitation for the exercise;Relaxation education  Stress management education;Encouraged to attend Cardiac Rehabilitation for the exercise;Relaxation education  Stress management education;Encouraged to attend Cardiac Rehabilitation for the exercise;Relaxation education     Continue Psychosocial Services   No Follow up required  No Follow up required  -     Comments  -  -  Patient's daughter was recently diagnosed with kidney cancer and has started undergoing treatments. Will continue to montior.       Initial Review   Source of Stress Concerns  -  -  Family        Psychosocial Discharge (Final Psychosocial Re-Evaluation): Psychosocial Re-Evaluation - 01/22/19 1514      Psychosocial Re-Evaluation  Current issues with  Current Stress Concerns    Comments  Patient's initial QOL socre was 22.72 and her PHQ-9 score was 2 with no psychosocial issues identified.    Expected Outcomes  Patient will have no psychosocial issues identified at discharge.    Interventions  Stress management education;Encouraged to attend Cardiac Rehabilitation for the exercise;Relaxation education    Comments  Patient's  daughter was recently diagnosed with kidney cancer and has started undergoing treatments. Will continue to montior.      Initial Review   Source of Stress Concerns  Family       Vocational Rehabilitation: Provide vocational rehab assistance to qualifying candidates.   Vocational Rehab Evaluation & Intervention: Vocational Rehab - 11/13/18 1428      Initial Vocational Rehab Evaluation & Intervention   Assessment shows need for Vocational Rehabilitation  No      Vocational Rehab Re-Evaulation   Comments  Patient is retired and not interested in returning to work.       Education: Education Goals: Education classes will be provided on a weekly basis, covering required topics. Participant will state understanding/return demonstration of topics presented.  Learning Barriers/Preferences: Learning Barriers/Preferences - 11/13/18 1429      Learning Barriers/Preferences   Learning Barriers  None    Learning Preferences  Skilled Demonstration       Education Topics: Hypertension, Hypertension Reduction -Define heart disease and high blood pressure. Discus how high blood pressure affects the body and ways to reduce high blood pressure.   CARDIAC REHAB PHASE II EXERCISE from 01/22/2019 in [place]  Date  01/15/19  Educator  DC  Instruction Review Code  2- Demonstrated Understanding      Exercise and Your Heart -Discuss why it is important to exercise, the FITT principles of exercise, normal and abnormal responses to exercise, and how to exercise safely.   CARDIAC REHAB PHASE II EXERCISE from 01/22/2019 in [place]  Date  01/22/19  Educator  MB  Instruction Review Code  2- Demonstrated Understanding      Angina -Discuss definition of angina, causes of angina, treatment of angina, and how to decrease risk of having angina.   Cardiac Medications -Review what the following cardiac medications are used for, how they affect the  body, and side effects that may occur when taking the medications.  Medications include Aspirin, Beta blockers, calcium channel blockers, ACE Inhibitors, angiotensin receptor blockers, diuretics, digoxin, and antihyperlipidemics.   Congestive Heart Failure -Discuss the definition of CHF, how to live with CHF, the signs and symptoms of CHF, and how keep track of weight and sodium intake.   Heart Disease and Intimacy -Discus the effect sexual activity has on the heart, how changes occur during intimacy as we age, and safety during sexual activity.   CARDIAC REHAB PHASE II EXERCISE from 01/22/2019 in [place]  Date  11/20/18  Educator  D. Coad  Instruction Review Code  2- Demonstrated Understanding      Smoking Cessation / COPD -Discuss different methods to quit smoking, the health benefits of quitting smoking, and the definition of COPD.   CARDIAC REHAB PHASE II EXERCISE from 01/22/2019 in [place]  Date  11/27/18  Educator  DJ  Instruction Review Code  2- Demonstrated Understanding      Nutrition I: Fats -Discuss the types of cholesterol, what cholesterol does to the heart, and how cholesterol levels can be controlled.   CARDIAC REHAB PHASE II EXERCISE  from 01/22/2019 in [place]  Date  12/04/18  Educator  DC  Instruction Review Code  2- Demonstrated Understanding      Nutrition II: Labels -Discuss the different components of food labels and how to read food label   CARDIAC REHAB PHASE II EXERCISE from 01/22/2019 in [place]  Date  12/11/18  Educator  DJ  Instruction Review Code  2- Demonstrated Understanding      Heart Parts/Heart Disease and PAD -Discuss the anatomy of the heart, the pathway of blood circulation through the heart, and these are affected by heart disease.   CARDIAC REHAB PHASE II EXERCISE from 01/22/2019 in [place]  Date  12/18/18   Educator  DJ  Instruction Review Code  2- Demonstrated Understanding      Stress I: Signs and Symptoms -Discuss the causes of stress, how stress may lead to anxiety and depression, and ways to limit stress.   CARDIAC REHAB PHASE II EXERCISE from 01/22/2019 in [place]  Date  12/25/18  Educator  DJ  Instruction Review Code  2- Demonstrated Understanding      Stress II: Relaxation -Discuss different types of relaxation techniques to limit stress.   CARDIAC REHAB PHASE II EXERCISE from 01/22/2019 in [place]  Date  01/01/19  Educator  DJ  Instruction Review Code  2- Demonstrated Understanding      Warning Signs of Stroke / TIA -Discuss definition of a stroke, what the signs and symptoms are of a stroke, and how to identify when someone is having stroke.   CARDIAC REHAB PHASE II EXERCISE from 01/22/2019 in [place]  Date  01/08/19  Educator  DJ  Instruction Review Code  2- Demonstrated Understanding      Knowledge Questionnaire Score: Knowledge Questionnaire Score - 11/13/18 1428      Knowledge Questionnaire Score   Pre Score  23/24       Core Components/Risk Factors/Patient Goals at Admission: Personal Goals and Risk Factors at Admission - 11/13/18 1433      Core Components/Risk Factors/Patient Goals on Admission    Weight Management  Weight Maintenance    Personal Goal Other  Yes    Personal Goal  Get back to normal ADL's; Do what she did before her surgery.    Intervention  Patient will attend CR 3 days/week and will supplement with exercise at home 2 days/week.    Expected Outcomes  Patient will meet her personal goals.       Core Components/Risk Factors/Patient Goals Review:  Goals and Risk Factor Review    Row Name 12/09/18 1446 12/30/18 1452 01/22/19 1511         Core Components/Risk Factors/Patient Goals Review   Personal Goals Review  Weight Management/Obesity;Hypertension Be  able to do ADL's/Get back to where she was before surgery.  Weight Management/Obesity;Hypertension Be able to do ADL's; get back to where she was before her surgery.  Weight Management/Obesity;Hypertension Be able to do ADL's; get back to where she was before surgery.     Review  Patient has completed 11 sessions losing 6 lbs since her orientation visit. She is doing well in the program with progressions. She has been evaluated by her pcp 2 times for hypertension. He started her on Losartan at 25 mg daily and increased the dose 2 weeks later to 50 mg daily. Her blood pressure today was 140/84 initially and she reports 120/84 before she left home.  She reports no side effects from the medication. She says she is feeling better and stronger and is able to do more of her activities. She enjoys coming to the program and feels she is meeting her goals.  Patient has completed 19 sessions gaining 2 lbs since last 30 day review. She continues to do well in the program with progression. She states she is feeling stronger and her blood pressue is improving after medication adjustments. She is getting back to where she was before her surgery and is able to do more of her ADL's. She is pleased with her progress in the program. Will continue to monitor for progress.  Patient has completed 29 sessions maintaining her weight since last 30 day review. She continues to do well in the program with continued progression. Her blood pressure is WNL more days. Her daughter was recently diagnosed with Kidney Cancer and she is anxious about her condition. She says she feels much stronger and has more energy and endurance and feels better overall. She is able to do her ADL's and all she wants to do she reports. She feels she is working toward getting back to where she was prior to her surgery. She is pleased with her progress in the program. Will continue to monitor for progress.     Expected Outcomes  Patient will continue to attend  sessions and complete the program meeting her personal goals.  Patient will continue to attend sessions and complete the program meeting her personal goals.  Patient will continue to attend sessions and complete the program meeting her personal goals.        Core Components/Risk Factors/Patient Goals at Discharge (Final Review):  Goals and Risk Factor Review - 01/22/19 1511      Core Components/Risk Factors/Patient Goals Review   Personal Goals Review  Weight Management/Obesity;Hypertension   Be able to do ADL's; get back to where she was before surgery.   Review  Patient has completed 29 sessions maintaining her weight since last 30 day review. She continues to do well in the program with continued progression. Her blood pressure is WNL more days. Her daughter was recently diagnosed with Kidney Cancer and she is anxious about her condition. She says she feels much stronger and has more energy and endurance and feels better overall. She is able to do her ADL's and all she wants to do she reports. She feels she is working toward getting back to where she was prior to her surgery. She is pleased with her progress in the program. Will continue to monitor for progress.    Expected Outcomes  Patient will continue to attend sessions and complete the program meeting her personal goals.       ITP Comments: ITP Comments    Row Name 11/18/18 1436           ITP Comments  Patient is new to the program. She has completed 2 sessions. Will continue to monitor for progress.          Comments: ITP REVIEW Pt is making expected progress toward Cardiac Rehab goals after completing 29 sessions. Recommend continued exercise, life style modification, education, and increased stamina and strength.

## 2019-01-22 NOTE — Progress Notes (Signed)
Daily Session Note  Patient Details  Name: SWAYZIE CHOATE MRN: [id number] Date of Birth: 1941/03/04 Referring Provider:     CARDIAC REHAB PHASE II ORIENTATION from 11/13/2018 in [place]  Referring Provider  [name]       Encounter Date: 01/22/2019  Check In: Session Check In - 01/22/19 0815      Check-In   Supervising physician immediately available to respond to emergencies  See telemetry face sheet for immediately available MD    Location  [facility]    Staff Present  Russella Dar, MS, EP, [facility], Exercise Physiologist;Debra Wynetta Emery, RN, [facility] Munch, Exercise Physiologist    Virtual Visit  No    Medication changes reported      No    Fall or balance concerns reported     No    Tobacco Cessation  No Change    Warm-up and Cool-down  Performed as group-led instruction    Resistance Training Performed  Yes    VAD Patient?  No    PAD/SET Patient?  No      Pain Assessment   Currently in Pain?  No/denies    Pain Score  0-No pain    Multiple Pain Sites  No       Capillary Blood Glucose: No results found for this or any previous visit (from the past 24 hour(s)).    Social History   Tobacco Use  Smoking Status Former Smoker  . Packs/day: 0.50  . Years: 10.00  . Pack years: 5.00  . Types: Cigarettes  Smokeless Tobacco Never Used    Goals Met:  Independence with exercise equipment Exercise tolerated well No report of cardiac concerns or symptoms Strength training completed today  Goals Unmet:  Not Applicable  Comments: Pt able to follow exercise prescription today without complaint.  Will continue to monitor for progression. Check out 0915.   Dr. Kate Sable is Medical Director for [facility] Cardiac and Pulmonary Rehab.

## 2019-01-23 LAB — LIPID PANEL
Chol/HDL Ratio: 2.9 ratio (ref 0.0–4.4)
Cholesterol, Total: 160 mg/dL (ref 100–199)
HDL: 56 mg/dL (ref >39)
LDL Chol Calc (NIH): 87 mg/dL (ref 0–99)
Triglycerides: 89 mg/dL (ref 0–149)
VLDL Cholesterol Cal: 17 mg/dL (ref 5–40)

## 2019-01-23 LAB — HEPATIC FUNCTION PANEL
ALT: 13 IU/L (ref 0–32)
AST: 14 IU/L (ref 0–40)
Albumin: 4.3 g/dL (ref 3.7–4.7)
Alkaline Phosphatase: 93 IU/L (ref 39–117)
Bilirubin Total: 0.9 mg/dL (ref 0.0–1.2)
Bilirubin, Direct: 0.23 mg/dL (ref 0.00–0.40)
Total Protein: 7.5 g/dL (ref 6.0–8.5)

## 2019-01-24 ENCOUNTER — Other Ambulatory Visit: Payer: Self-pay

## 2019-01-24 ENCOUNTER — Encounter (HOSPITAL_COMMUNITY)
Admission: RE | Admit: 2019-01-24 | Discharge: 2019-01-24 | Disposition: A | Discharge status: Home / Self Care | Payer: Medicare Other | Source: Ambulatory Visit | Attending: Cardiovascular Disease | Admitting: Cardiovascular Disease

## 2019-01-24 DIAGNOSIS — Z9889 Other specified postprocedural states: Secondary | ICD-10-CM | POA: Diagnosis not present

## 2019-01-24 DIAGNOSIS — Z953 Presence of xenogenic heart valve: Secondary | ICD-10-CM

## 2019-01-24 NOTE — Progress Notes (Signed)
Daily Session Note  Patient Details  Name: Hannah Roy MRN: [id number] Date of Birth: [birth year] Referring Provider:     CARDIAC REHAB PHASE II ORIENTATION from 11/13/2018 in [place]  Referring Provider  [name]       Encounter Date: 01/24/2019  Check In: Session Check In - 01/24/19 1820      Check-In   Supervising physician immediately available to respond to emergencies  See telemetry face sheet for immediately available MD    Location  [facility]    Staff Present  Russella Dar, MS, EP, [facility], Exercise Physiologist;Amanda Zachery Conch, Exercise Physiologist    Virtual Visit  No    Medication changes reported      No    Fall or balance concerns reported     No    Tobacco Cessation  No Change    Warm-up and Cool-down  Performed as group-led instruction    Resistance Training Performed  Yes    VAD Patient?  No    PAD/SET Patient?  No      Pain Assessment   Currently in Pain?  No/denies    Pain Score  0-No pain    Multiple Pain Sites  No       Capillary Blood Glucose: No results found for this or any previous visit (from the past 24 hour(s)).    Social History   Tobacco Use  Smoking Status Former Smoker  . Packs/day: 0.50  . Years: 10.00  . Pack years: 5.00  . Types: Cigarettes  Smokeless Tobacco Never Used    Goals Met:  Independence with exercise equipment Exercise tolerated well Personal goals reviewed No report of cardiac concerns or symptoms Strength training completed today  Goals Unmet:  Not Applicable  Comments: Check out: 0915   Dr. Kate Sable is Medical Director for [place] and Pulmonary Rehab.

## 2019-01-27 ENCOUNTER — Encounter: Payer: Self-pay | Admitting: Family Medicine

## 2019-01-27 ENCOUNTER — Other Ambulatory Visit: Payer: Self-pay

## 2019-01-27 ENCOUNTER — Encounter (HOSPITAL_COMMUNITY)
Admission: RE | Admit: 2019-01-27 | Discharge: 2019-01-27 | Disposition: A | Discharge status: Home / Self Care | Payer: Medicare Other | Source: Ambulatory Visit | Attending: Cardiovascular Disease | Admitting: Cardiovascular Disease

## 2019-01-27 ENCOUNTER — Telehealth: Payer: Self-pay | Admitting: Cardiovascular Disease

## 2019-01-27 ENCOUNTER — Ambulatory Visit (INDEPENDENT_AMBULATORY_CARE_PROVIDER_SITE_OTHER): Payer: Medicare Other | Admitting: Family Medicine

## 2019-01-27 VITALS — BP 136/86 | Temp 97.3°F | Wt 117.0 lb

## 2019-01-27 DIAGNOSIS — Z953 Presence of xenogenic heart valve: Secondary | ICD-10-CM

## 2019-01-27 DIAGNOSIS — Z9889 Other specified postprocedural states: Secondary | ICD-10-CM

## 2019-01-27 DIAGNOSIS — I10 Essential (primary) hypertension: Secondary | ICD-10-CM

## 2019-01-27 MED ORDER — ROSUVASTATIN CALCIUM 5 MG PO TABS: ORAL_TABLET | ORAL | 6 refills | Status: DC

## 2019-01-27 MED ORDER — AMLODIPINE BESYLATE 2.5 MG PO TABS: ORAL_TABLET | ORAL | 3 refills | Status: DC

## 2019-01-27 MED ORDER — WARFARIN SODIUM 2.5 MG PO TABS: ORAL_TABLET | ORAL | 3 refills | Status: DC

## 2019-01-27 NOTE — Telephone Encounter (Signed)
[facility] called stating the pt is needing a new Rx of her [place] sent to the pharmacy since there's been a dosing change, her ins will not pay

## 2019-01-27 NOTE — Progress Notes (Signed)
   Subjective:    Patient ID: Hannah Roy, female    DOB: 1941/01/11, 78 y.o.   MRN: [id number]  Hypertension This is a chronic problem. Pertinent negatives include no chest pain or shortness of breath. There are no compliance problems.   pt is taking Losartan 50 mg in the morning and Losartan 25 mg in the evening.  Blood pressure readings have been a little bit all over the place sometimes they are in the low range at home but a lot of times at cardiac rehab there are mildly elevated she denies any type of chest tightness pressure pain shortness of breath Pt would like results of most recent labs.   Review of Systems  Constitutional: Negative for activity change and appetite change.  HENT: Negative for congestion and rhinorrhea.   Respiratory: Negative for cough and shortness of breath.   Cardiovascular: Negative for chest pain and leg swelling.  Gastrointestinal: Negative for abdominal pain, nausea and vomiting.  Skin: Negative for color change.  Neurological: Negative for dizziness and weakness.  Psychiatric/Behavioral: Negative for agitation and confusion.       Objective:   Physical Exam Vitals signs reviewed.  Constitutional:      General: She is not in acute distress. HENT:     Head: Normocephalic and atraumatic.  Eyes:     General:        Right eye: No discharge.        Left eye: No discharge.  Neck:     Trachea: No tracheal deviation.  Cardiovascular:     Rate and Rhythm: Normal rate and regular rhythm.     Heart sounds: Normal heart sounds. No murmur.  Pulmonary:     Effort: Pulmonary effort is normal. No respiratory distress.     Breath sounds: Normal breath sounds.  Lymphadenopathy:     Cervical: No cervical adenopathy.  Skin:    General: Skin is warm and dry.  Neurological:     Mental Status: She is alert.     Coordination: Coordination normal.  Psychiatric:        Behavior: Behavior normal.      Patient did a senior dose flu shot 1 week ago      Assessment & Plan:  Cholesterol profile not bad but could be a little bit better we will increase the rosuvastatin to 3 days a week.  Blood pressure could be better difficult to adjust because her blood pressure readings at home sometimes are on the lower end so therefore we will do a hybrid Losartan 50 mg every morning Amlodipine 2.5 mg use a half each evening  Follow-up again in 4 weeks to review to see how blood pressures are doing

## 2019-01-27 NOTE — Addendum Note (Signed)
Addended by: Sallee Lange A on: 01/27/2019 12:25 PM   Modules accepted: Orders

## 2019-01-27 NOTE — Progress Notes (Signed)
Daily Session Note  Patient Details  Name: Hannah Roy MRN: [id number] Date of Birth: 28-Dec-1940 Referring Provider:     CARDIAC REHAB PHASE II ORIENTATION from 11/13/2018 in [place]  Referring Provider  [name]       Encounter Date: 01/27/2019  Check In: Session Check In - 01/27/19 0830      Check-In   Supervising physician immediately available to respond to emergencies  See telemetry face sheet for immediately available MD    Location  [facility]    Staff Present  Russella Dar, MS, EP, [facility], Exercise Physiologist;Amanda Zachery Conch, Exercise Physiologist    Virtual Visit  No    Medication changes reported      No    Fall or balance concerns reported     No    Tobacco Cessation  No Change    Warm-up and Cool-down  Performed as group-led instruction    Resistance Training Performed  Yes    VAD Patient?  No    PAD/SET Patient?  No      Pain Assessment   Currently in Pain?  No/denies    Pain Score  0-No pain    Multiple Pain Sites  No       Capillary Blood Glucose: No results found for this or any previous visit (from the past 24 hour(s)).    Social History   Tobacco Use  Smoking Status Former Smoker  . Packs/day: 0.50  . Years: 10.00  . Pack years: 5.00  . Types: Cigarettes  Smokeless Tobacco Never Used    Goals Met:  Independence with exercise equipment Exercise tolerated well Personal goals reviewed Queuing for purse lip breathing No report of cardiac concerns or symptoms Strength training completed today  Goals Unmet:  Not Applicable  Comments: Check out: 0915   Dr. Kate Sable is Medical Director for [place] and Pulmonary Rehab.

## 2019-01-29 ENCOUNTER — Other Ambulatory Visit: Payer: Self-pay

## 2019-01-29 ENCOUNTER — Encounter (HOSPITAL_COMMUNITY)
Admission: RE | Admit: 2019-01-29 | Discharge: 2019-01-29 | Disposition: A | Discharge status: Home / Self Care | Payer: Medicare Other | Source: Ambulatory Visit | Attending: Cardiovascular Disease | Admitting: Cardiovascular Disease

## 2019-01-29 DIAGNOSIS — Z953 Presence of xenogenic heart valve: Secondary | ICD-10-CM | POA: Diagnosis not present

## 2019-01-29 DIAGNOSIS — Z9889 Other specified postprocedural states: Secondary | ICD-10-CM | POA: Diagnosis not present

## 2019-01-29 NOTE — Progress Notes (Signed)
Daily Session Note  Patient Details  Name: MARGARETH KANNER MRN: [id number] Date of Birth: 1941-03-25 Referring Provider:     CARDIAC REHAB PHASE II ORIENTATION from 11/13/2018 in [place]  Referring Provider  [name]       Encounter Date: 01/29/2019  Check In: Session Check In - 01/29/19 0831      Check-In   Supervising physician immediately available to respond to emergencies  See telemetry face sheet for immediately available MD    Location  [facility]    Staff Present  Russella Dar, MS, EP, [facility], Exercise Physiologist;Amanda Zachery Conch, Exercise Physiologist    Virtual Visit  No    Medication changes reported      Yes    Comments  Amiodarone 1.55m at bedtime    Fall or balance concerns reported     No    Tobacco Cessation  No Change    Warm-up and Cool-down  Performed as group-led instruction    Resistance Training Performed  Yes    VAD Patient?  No    PAD/SET Patient?  No      Pain Assessment   Currently in Pain?  No/denies    Pain Score  0-No pain    Multiple Pain Sites  No       Capillary Blood Glucose: No results found for this or any previous visit (from the past 24 hour(s)).    Social History   Tobacco Use  Smoking Status Former Smoker  . Packs/day: 0.50  . Years: 10.00  . Pack years: 5.00  . Types: Cigarettes  Smokeless Tobacco Never Used    Goals Met:  Independence with exercise equipment Exercise tolerated well Personal goals reviewed No report of cardiac concerns or symptoms Strength training completed today  Goals Unmet:  Not Applicable  Comments: check out 915   Dr. SKate Sableis Medical Director for [facility].

## 2019-01-31 ENCOUNTER — Encounter (HOSPITAL_COMMUNITY)
Admission: RE | Admit: 2019-01-31 | Discharge: 2019-01-31 | Disposition: A | Discharge status: Home / Self Care | Payer: Medicare Other | Source: Ambulatory Visit | Attending: Cardiovascular Disease | Admitting: Cardiovascular Disease

## 2019-01-31 ENCOUNTER — Other Ambulatory Visit: Payer: Self-pay

## 2019-01-31 DIAGNOSIS — Z953 Presence of xenogenic heart valve: Secondary | ICD-10-CM

## 2019-01-31 DIAGNOSIS — Z9889 Other specified postprocedural states: Secondary | ICD-10-CM

## 2019-01-31 NOTE — Progress Notes (Signed)
Daily Session Note  Patient Details  Name: Hannah Roy MRN: [id number] Date of Birth: 1941-03-06 Referring Provider:     CARDIAC REHAB PHASE II ORIENTATION from 11/13/2018 in [place]  Referring Provider  [name]       Encounter Date: 01/31/2019  Check In: Session Check In - 01/31/19 0808      Check-In   Supervising physician immediately available to respond to emergencies  See telemetry face sheet for immediately available MD    Location  [facility]    Staff Present  Russella Dar, MS, EP, [facility], Exercise Physiologist;Amanda Ballard, Exercise Physiologist;Merek Niu Wynetta Emery, RN, BSN    Virtual Visit  No    Medication changes reported      No    Comments  Patient was put on Amlodipine 2.5 mg take 0.5 mg at bedtime. Amiodarone was not added.    Fall or balance concerns reported     No    Tobacco Cessation  No Change    Warm-up and Cool-down  Performed as group-led instruction    Resistance Training Performed  Yes    VAD Patient?  No    PAD/SET Patient?  No      Pain Assessment   Currently in Pain?  No/denies    Pain Score  0-No pain    Multiple Pain Sites  No       Capillary Blood Glucose: No results found for this or any previous visit (from the past 24 hour(s)).    Social History   Tobacco Use  Smoking Status Former Smoker  . Packs/day: 0.50  . Years: 10.00  . Pack years: 5.00  . Types: Cigarettes  Smokeless Tobacco Never Used    Goals Met:  Independence with exercise equipment Exercise tolerated well No report of cardiac concerns or symptoms Strength training completed today  Goals Unmet:  Not Applicable  Comments: Pt able to follow exercise prescription today without complaint.  Will continue to monitor for progression. Check out 915.   Dr. Kate Sable is Medical Director for [facility] Cardiac and Pulmonary Rehab.

## 2019-02-03 ENCOUNTER — Encounter (HOSPITAL_COMMUNITY)
Admission: RE | Admit: 2019-02-03 | Discharge: 2019-02-03 | Disposition: A | Discharge status: Home / Self Care | Payer: Medicare Other | Source: Ambulatory Visit | Attending: Cardiovascular Disease | Admitting: Cardiovascular Disease

## 2019-02-03 ENCOUNTER — Other Ambulatory Visit: Payer: Self-pay

## 2019-02-03 ENCOUNTER — Ambulatory Visit (INDEPENDENT_AMBULATORY_CARE_PROVIDER_SITE_OTHER): Payer: Medicare Other | Admitting: *Deleted

## 2019-02-03 VITALS — Ht 61.0 in | Wt 116.4 lb

## 2019-02-03 DIAGNOSIS — Z9889 Other specified postprocedural states: Secondary | ICD-10-CM | POA: Diagnosis not present

## 2019-02-03 DIAGNOSIS — Z5181 Encounter for therapeutic drug level monitoring: Secondary | ICD-10-CM | POA: Diagnosis not present

## 2019-02-03 DIAGNOSIS — I4819 Other persistent atrial fibrillation: Secondary | ICD-10-CM | POA: Diagnosis not present

## 2019-02-03 DIAGNOSIS — Z953 Presence of xenogenic heart valve: Secondary | ICD-10-CM | POA: Diagnosis not present

## 2019-02-03 LAB — POCT INR: INR: 2.4 (ref 2.0–3.0)

## 2019-02-03 NOTE — Progress Notes (Signed)
Daily Session Note  Patient Details  Name: Hannah Roy MRN: [id number] Date of Birth: 07-Feb-1941 Referring Provider:     CARDIAC REHAB PHASE II ORIENTATION from 11/13/2018 in [place]  Referring Provider  [name]       Encounter Date: 02/03/2019  Check In: Session Check In - 02/03/19 0815      Check-In   Supervising physician immediately available to respond to emergencies  See telemetry face sheet for immediately available MD    Location  [facility]    Staff Present  Russella Dar, MS, EP, [facility], Exercise Physiologist;Kire Ferg Zachery Conch, Exercise Physiologist;Debra Wynetta Emery, RN, BSN    Virtual Visit  No    Medication changes reported      No    Fall or balance concerns reported     No    Tobacco Cessation  No Change    Warm-up and Cool-down  Performed as group-led instruction    Resistance Training Performed  Yes    VAD Patient?  No    PAD/SET Patient?  No      Pain Assessment   Currently in Pain?  No/denies    Pain Score  0-No pain    Multiple Pain Sites  No       Capillary Blood Glucose: No results found for this or any previous visit (from the past 24 hour(s)).    Social History   Tobacco Use  Smoking Status Former Smoker  . Packs/day: 0.50  . Years: 10.00  . Pack years: 5.00  . Types: Cigarettes  Smokeless Tobacco Never Used    Goals Met:  Independence with exercise equipment Exercise tolerated well No report of cardiac concerns or symptoms Strength training completed today  Goals Unmet:  Not Applicable  Comments: Pt able to follow exercise prescription today without complaint.  Will continue to monitor for progression. Check out 0915.   Dr. Kate Sable is Medical Director for [facility] Cardiac and Pulmonary Rehab.

## 2019-02-03 NOTE — Patient Instructions (Signed)
Continue warfarin 2 tablets daily except 1 1/2 tablets on Sundays, Tuesdays and Thursdays Recheck in 4 wks 

## 2019-02-05 ENCOUNTER — Other Ambulatory Visit: Payer: Self-pay

## 2019-02-05 ENCOUNTER — Encounter (HOSPITAL_COMMUNITY)
Admission: RE | Admit: 2019-02-05 | Discharge: 2019-02-05 | Disposition: A | Discharge status: Home / Self Care | Payer: Medicare Other | Source: Ambulatory Visit | Attending: Cardiovascular Disease | Admitting: Cardiovascular Disease

## 2019-02-05 DIAGNOSIS — Z9889 Other specified postprocedural states: Secondary | ICD-10-CM

## 2019-02-05 DIAGNOSIS — Z953 Presence of xenogenic heart valve: Secondary | ICD-10-CM

## 2019-02-05 NOTE — Progress Notes (Signed)
Daily Session Note  Patient Details  Name: [name] MRN: [id number] Date of Birth: 04/17/1940 Referring Provider:     CARDIAC REHAB PHASE II ORIENTATION from 11/13/2018 in [place]  Referring Provider  [name]       Encounter Date: 02/05/2019  Check In: Session Check In - 02/05/19 0815      Check-In   Supervising physician immediately available to respond to emergencies  See telemetry face sheet for immediately available MD    Location  [facility]    Staff Present  Russella Dar, MS, EP, [facility], Exercise Physiologist;Alissia Lory Zachery Conch, Exercise Physiologist;Debra Wynetta Emery, RN, BSN    Virtual Visit  No    Medication changes reported      No    Fall or balance concerns reported     No    Tobacco Cessation  No Change    Warm-up and Cool-down  Performed as group-led instruction    Resistance Training Performed  Yes    VAD Patient?  No    PAD/SET Patient?  No      Pain Assessment   Currently in Pain?  No/denies    Pain Score  0-No pain    Multiple Pain Sites  No       Capillary Blood Glucose: No results found for this or any previous visit (from the past 24 hour(s)).    Social History   Tobacco Use  Smoking Status Former Smoker  . Packs/day: 0.50  . Years: 10.00  . Pack years: 5.00  . Types: Cigarettes  Smokeless Tobacco Never Used    Goals Met:  Independence with exercise equipment Exercise tolerated well No report of cardiac concerns or symptoms Strength training completed today  Goals Unmet:  Not Applicable  Comments: Pt able to follow exercise prescription today without complaint.  Will continue to monitor for progression. Check out 0915.   Dr. Kate Sable is Medical Director for [facility] Cardiac and Pulmonary Rehab.

## 2019-02-07 ENCOUNTER — Other Ambulatory Visit: Payer: Self-pay

## 2019-02-07 ENCOUNTER — Encounter (HOSPITAL_COMMUNITY)
Admission: RE | Admit: 2019-02-07 | Discharge: 2019-02-07 | Disposition: A | Discharge status: Home / Self Care | Payer: Medicare Other | Source: Ambulatory Visit | Attending: Cardiovascular Disease | Admitting: Cardiovascular Disease

## 2019-02-07 DIAGNOSIS — Z9889 Other specified postprocedural states: Secondary | ICD-10-CM

## 2019-02-07 DIAGNOSIS — Z953 Presence of xenogenic heart valve: Secondary | ICD-10-CM

## 2019-02-07 NOTE — Progress Notes (Signed)
Discharge Progress Report  Patient Details  Name: Hannah Roy MRN: [id number] Date of Birth: 30-Jan-1941 Referring Provider:     [place] from 11/13/2018 in [place]  Referring Provider  [place]        Number of Visits: 80  Reason for Discharge:  Patient reached a stable level of exercise. Patient independent in their exercise. Patient has met program and personal goals.  Smoking History:  Social History   Tobacco Use  Smoking Status Former Smoker  . Packs/day: 0.50  . Years: 10.00  . Pack years: 5.00  . Types: Cigarettes  Smokeless Tobacco Never Used    Diagnosis:  S/P aortic valve replacement with bioprosthetic valve  S/P tricuspid valve repair  ADL UCSD:   Initial Exercise Prescription: Initial Exercise Prescription - 11/13/18 1300      Date of Initial Exercise RX and Referring Provider   Date  11/13/18    Referring Provider  Koneswaran     Expected Discharge Date  02/13/19      Treadmill   MPH  1.3    Grade  0    Minutes  17    METs  1.99      NuStep   Level  1    SPM  60    Minutes  17    METs  1.6      Prescription Details   Frequency (times per week)  3    Duration  Progress to 30 minutes of continuous aerobic without signs/symptoms of physical distress      Intensity   THRR 40-80% of Max Heartrate  [phone]    Ratings of Perceived Exertion  11-13    Perceived Dyspnea  0-4      Progression   Progression  Continue to progress workloads to maintain intensity without signs/symptoms of physical distress.      Resistance Training   Training Prescription  Yes    Weight  1    Reps  10-15       Discharge Exercise Prescription (Final Exercise Prescription Changes): Exercise Prescription Changes - 01/30/19 1000      Response to Exercise   Blood Pressure (Admit)  144/82    Blood Pressure (Exercise)  140/98    Blood Pressure (Exit)  134/80    Heart Rate (Admit)  81 bpm    Heart Rate  (Exercise)  129 bpm    Heart Rate (Exit)  102 bpm    Rating of Perceived Exertion (Exercise)  11    Comments  increase in TM speed    Duration  Continue with 30 min of aerobic exercise without signs/symptoms of physical distress.    Intensity  THRR unchanged      Resistance Training   Training Prescription  Yes    Weight  3   pt. wants to stay at 2lbs, doesn't think she can increase    Reps  10-15      Treadmill   MPH  3    Grade  0    Minutes  17    METs  3.29      NuStep   Level  3    SPM  133    Minutes  22    METs  4.2      Home Exercise Plan   Plans to continue exercise at  Home (comment)    Frequency  Add 2 additional days to program exercise sessions.    Initial Home Exercises Provided  11/13/18       Functional Capacity: 6 Minute Walk    Row Name 11/13/18 1344 02/03/19 0847       6 Minute Walk   Phase  Initial  Discharge    Distance  1200 feet  1400 feet    Distance % Change  -  16 %    Distance Feet Change  -  200 ft    Walk Time  6 minutes  6 minutes    # of Rest Breaks  0  0    MPH  2.27  2.65    METS  2.74  3.03    RPE  9  11    Perceived Dyspnea   9  8    VO2 Peak  9.4  10.7    Symptoms  No  No    Resting HR  75 bpm  93 bpm    Resting BP  160/84  132/80    Resting Oxygen Saturation   98 %  98 %    Exercise Oxygen Saturation  during 6 min walk  96 %  95 %    Max Ex. HR  95 bpm  105 bpm    Max Ex. BP  166/80  146/82    2 Minute Post BP  148/80  130/80       Psychological, QOL, Others - Outcomes: PHQ 2/9: Depression screen [facility] 2/9 02/05/2019 11/13/2018 05/02/2017 01/12/2015 10/17/2013  Decreased Interest 0 0 0 0 0  Down, Depressed, Hopeless 0 0 0 0 0  PHQ - 2 Score 0 0 0 0 0  Altered sleeping 0 1 1 - -  Tired, decreased energy 0 1 2 - -  Change in appetite 1 0 0 - -  Feeling bad or failure about yourself  0 0 0 - -  Trouble concentrating 0 0 0 - -  Moving slowly or fidgety/restless 0 0 1 - -  Suicidal thoughts 0 0 0 - -  PHQ-9 Score 1 2 4   - -  Difficult doing work/chores Not difficult at all Not difficult at all - - -    Quality of Life: Quality of Life - 02/07/19 1557      Quality of Life Scores   Health/Function Pre  21.5 %    Health/Function Post  22.43 %    Health/Function % Change  4.33 %    Socioeconomic Pre  24 %    Socioeconomic Post  24.25 %    Socioeconomic % Change   1.04 %    Psych/Spiritual Pre  24 %    Psych/Spiritual Post  25 %    Psych/Spiritual % Change  4.17 %    Family Pre  22.8 %    Family Post  17.9 %    Family % Change  -21.49 %    GLOBAL Pre  22.72 %    GLOBAL Post  22.55 %    GLOBAL % Change  -0.75 %       Personal Goals: Goals established at orientation with interventions provided to work toward goal. Personal Goals and Risk Factors at Admission - 11/13/18 1433      Core Components/Risk Factors/Patient Goals on Admission    Weight Management  Weight Maintenance    Personal Goal Other  Yes    Personal Goal  Get back to normal ADL's; Do what she did before her surgery.    Intervention  Patient will attend CR 3 days/week and will supplement with exercise at  home 2 days/week.    Expected Outcomes  Patient will meet her personal goals.        Personal Goals Discharge: Goals and Risk Factor Review    Row Name 12/09/18 1446 12/30/18 1452 01/22/19 1511 02/07/19 1601       Core Components/Risk Factors/Patient Goals Review   Personal Goals Review  Weight Management/Obesity;Hypertension Be able to do ADL's/Get back to where she was before surgery.  Weight Management/Obesity;Hypertension Be able to do ADL's; get back to where she was before her surgery.  Weight Management/Obesity;Hypertension Be able to do ADL's; get back to where she was before surgery.  Weight Management/Obesity;Hypertension Be able to do ADL's; get back to where she was before surgery.    Review  Patient has completed 11 sessions losing 6 lbs since her orientation visit. She is doing well in the program with progressions.  She has been evaluated by her pcp 2 times for hypertension. He started her on Losartan at 25 mg daily and increased the dose 2 weeks later to 50 mg daily. Her blood pressure today was 140/84 initially and she reports 120/84 before she left home. She reports no side effects from the medication. She says she is feeling better and stronger and is able to do more of her activities. She enjoys coming to the program and feels she is meeting her goals.  Patient has completed 19 sessions gaining 2 lbs since last 30 day review. She continues to do well in the program with progression. She states she is feeling stronger and her blood pressue is improving after medication adjustments. She is getting back to where she was before her surgery and is able to do more of her ADL's. She is pleased with her progress in the program. Will continue to monitor for progress.  Patient has completed 29 sessions maintaining her weight since last 30 day review. She continues to do well in the program with continued progression. Her blood pressure is WNL more days. Her daughter was recently diagnosed with Kidney Cancer and she is anxious about her condition. She says she feels much stronger and has more energy and endurance and feels better overall. She is able to do her ADL's and all she wants to do she reports. She feels she is working toward getting back to where she was prior to her surgery. She is pleased with her progress in the program. Will continue to monitor for progress.  Patient completed the program with 36 sessions losing 6.3 lbs overall and decreasing in 2 inches in her waist measurements and 1.5 inches in her hip measurements. Her exit measurements improved in grip strength and balance. Her exit medficts score improved by 85%. She feels she has met her nutritional goals. Her exit walk test increased by 16.1%. She did very well in the program stating she is able to do her ADL's and all activities she wants without difficulty. She  feels she is back to where she was before surgery. She says she feel stronger with more energy and better overall. Her blood pressue is better controlled. She continues to monitor her b/p at home and says she is lowest in the evening averaging 120/70. She states she is better able to manage her stress but still feels the is what elevates her blood pressure. She says she is walking everyday and plans to join our [employer] program to continue exercising. CR will f/u for one year.    Expected Outcomes  Patient will continue to attend  sessions and complete the program meeting her personal goals.  Patient will continue to attend sessions and complete the program meeting her personal goals.  Patient will continue to attend sessions and complete the program meeting her personal goals.  Patient will continue to exercise in our [place] Maintenance program and continue to meet her personal goals.       Exercise Goals and Review: Exercise Goals    Row Name 11/13/18 1348             Exercise Goals   Increase Physical Activity  Yes       Intervention  Provide advice, education, support and counseling about physical activity/exercise needs.;Develop an individualized exercise prescription for aerobic and resistive training based on initial evaluation findings, risk stratification, comorbidities and participant's personal goals.       Expected Outcomes  Short Term: Attend rehab on a regular basis to increase amount of physical activity.;Long Term: Add in home exercise to make exercise part of routine and to increase amount of physical activity.;Long Term: Exercising regularly at least 3-5 days a week.       Increase Strength and Stamina  Yes       Intervention  Provide advice, education, support and counseling about physical activity/exercise needs.;Develop an individualized exercise prescription for aerobic and resistive training based on initial evaluation findings, risk stratification,  comorbidities and participant's personal goals.       Expected Outcomes  Short Term: Increase workloads from initial exercise prescription for resistance, speed, and METs.;Short Term: Perform resistance training exercises routinely during rehab and add in resistance training at home;Long Term: Improve cardiorespiratory fitness, muscular endurance and strength as measured by increased METs and functional capacity (6MWT)       Able to understand and use rate of perceived exertion (RPE) scale  Yes       Intervention  Provide education and explanation on how to use RPE scale       Expected Outcomes  Short Term: Able to use RPE daily in rehab to express subjective intensity level;Long Term:  Able to use RPE to guide intensity level when exercising independently       Knowledge and understanding of Target Heart Rate Range (THRR)  Yes       Intervention  Provide education and explanation of THRR including how the numbers were predicted and where they are located for reference       Expected Outcomes  Short Term: Able to state/look up THRR;Long Term: Able to use THRR to govern intensity when exercising independently;Short Term: Able to use daily as guideline for intensity in rehab       Able to check pulse independently  Yes       Intervention  Provide education and demonstration on how to check pulse in carotid and radial arteries.;Review the importance of being able to check your own pulse for safety during independent exercise       Expected Outcomes  Short Term: Able to explain why pulse checking is important during independent exercise;Long Term: Able to check pulse independently and accurately       Understanding of Exercise Prescription  Yes       Intervention  Provide education, explanation, and written materials on patient's individual exercise prescription       Expected Outcomes  Short Term: Able to explain program exercise prescription;Long Term: Able to explain home exercise prescription to  exercise independently          Exercise Goals Re-Evaluation: Exercise  Goals Re-Evaluation    Row Name 11/18/18 1413 12/09/18 1358 12/30/18 1339 01/21/19 1249       Exercise Goal Re-Evaluation   Exercise Goals Review  Increase Physical Activity;Increase Strength and Stamina;Able to understand and use rate of perceived exertion (RPE) scale;Knowledge and understanding of Target Heart Rate Range (THRR);Able to check pulse independently;Understanding of Exercise Prescription  Increase Physical Activity;Increase Strength and Stamina;Able to understand and use rate of perceived exertion (RPE) scale;Knowledge and understanding of Target Heart Rate Range (THRR);Able to check pulse independently;Understanding of Exercise Prescription  Increase Physical Activity;Increase Strength and Stamina;Able to understand and use rate of perceived exertion (RPE) scale;Knowledge and understanding of Target Heart Rate Range (THRR);Able to check pulse independently;Understanding of Exercise Prescription  Increase Physical Activity;Increase Strength and Stamina;Able to understand and use rate of perceived exertion (RPE) scale;Knowledge and understanding of Target Heart Rate Range (THRR);Able to check pulse independently;Understanding of Exercise Prescription    Comments  Pt. is following exercise prescription to help her reach her goals.  Pt. has attended regularly. She enjoys coming to rehab and is working to increase her strength  Pt. continues to come to rehab every session. She is doing great and says she really enjoys the program. Pt. states it has helped her to increase her strength and stamina.  Achaia continues to do extremely well in Cardiac Rehab. She still works hard every session to get better and increase her strength. She is always lazor on her exercise and bettering herself.    Expected Outcomes  Short: increase overall activity level Long: develop exercise routine for home  Short: return to ADLs Long: get back to  pre-surgery strength  Short: return to ADLs Long: get back to pre-surgery strength  Short: continue to increase activity level. Long: develop long term exercise routine       Nutrition & Weight - Outcomes: Pre Biometrics - 11/13/18 1348      Pre Biometrics   Height  5' 1"  (1.549 m)    Waist Circumference  28 inches    Hip Circumference  34 inches    Waist to Hip Ratio  0.82 %    Triceps Skinfold  8 mm    % Body Fat  28.7 %    Grip Strength  5.7 kg    Single Leg Stand  37 seconds      Post Biometrics - 02/03/19 0848       Post  Biometrics   Height  5' 1"  (1.549 m)    Weight  52.8 kg    Waist Circumference  26 inches    Hip Circumference  33 inches    Waist to Hip Ratio  0.79 %    BMI (Calculated)  22.01    Triceps Skinfold  8 mm    % Body Fat  27.3 %    Grip Strength  5.9 kg    Single Leg Stand  60 seconds       Nutrition: Nutrition Therapy & Goals - 01/30/19 1559      Personal Nutrition Goals   Nutrition Goal  For heart healthy choices add >50% of whole grains, make half their plate fruits and vegetables. Discuss the difference between starchy vegetables and leafy greens, and how leafy vegetables provide fiber, helps maintain healthy weight, helps control blood glucose, and lowers cholesterol.  Discuss purchasing fresh or frozen vegetable to reduce sodium and not to add grease, fat or sugar. Consume <18oz of red meat per week. Consume lean cuts of meats and  very little of meats high in sodium and nitrates such as pork and lunch meats. Discussed portion control for all food groups.    Comments  Patient attended RD class Thursday 01/30/19. Will continue to monitor for progress.       Nutrition Discharge: Nutrition Assessments - 02/05/19 1621      MEDFICTS Scores   Pre Score  21    Post Score  3    Score Difference  -18       Education Questionnaire Score: Knowledge Questionnaire Score - 02/05/19 1621      Knowledge Questionnaire Score   Pre Score  23/24    Post  Score  24/24     Patient graduated from [place] today on 02/07/19 after completing 36 sessions. She achieved LTG of 30 minutes of aerobic exercise at Max Met level of 3.37. All patients vitals are WNL. Patient has met with dietician. Discharge instruction has been reviewed in detail and patient stated an understanding of material given. Patient has joined our [employer] program to continue exercising. Cardiac Rehab staff will make f/u calls at 1 month, 6 months, and 1 year. Patient had no complaints of any abnormal S/S or pain on their exit visit.   Goals reviewed with patient; copy given to patient.

## 2019-02-07 NOTE — Progress Notes (Signed)
Daily Session Note  Patient Details  Name: ANTIONETTE LUSTER MRN: [id number] Date of Birth: [birth year] Referring Provider:     CARDIAC REHAB PHASE II ORIENTATION from 11/13/2018 in [place]  Referring Provider  [name]       Encounter Date: 02/07/2019  Check In: Session Check In - 02/07/19 0813      Check-In   Supervising physician immediately available to respond to emergencies  See telemetry face sheet for immediately available MD    Location  [facility]    Staff Present  Russella Dar, MS, EP, [facility], Exercise Physiologist;Tierria Watson Wynetta Emery, RN, BSN    Virtual Visit  No    Medication changes reported      No    Fall or balance concerns reported     No    Tobacco Cessation  No Change    Warm-up and Cool-down  Performed as group-led instruction    Resistance Training Performed  Yes    VAD Patient?  No    PAD/SET Patient?  No      Pain Assessment   Currently in Pain?  No/denies    Pain Score  0-No pain    Multiple Pain Sites  No       Capillary Blood Glucose: No results found for this or any previous visit (from the past 24 hour(s)).    Social History   Tobacco Use  Smoking Status Former Smoker  . Packs/day: 0.50  . Years: 10.00  . Pack years: 5.00  . Types: Cigarettes  Smokeless Tobacco Never Used    Goals Met:  Independence with exercise equipment Exercise tolerated well No report of cardiac concerns or symptoms Strength training completed today  Goals Unmet:  Not Applicable  Comments: Pt able to follow exercise prescription today without complaint.  Will continue to monitor for progression. Check out 915.   Dr. Kate Sable is Medical Director for [facility] Cardiac and Pulmonary Rehab.

## 2019-02-17 ENCOUNTER — Encounter: Payer: Self-pay | Admitting: Family Medicine

## 2019-02-17 ENCOUNTER — Ambulatory Visit (INDEPENDENT_AMBULATORY_CARE_PROVIDER_SITE_OTHER): Payer: Medicare Other | Admitting: Family Medicine

## 2019-02-17 ENCOUNTER — Other Ambulatory Visit: Payer: Self-pay

## 2019-02-17 VITALS — BP 138/80 | Temp 97.4°F | Wt 117.8 lb

## 2019-02-17 DIAGNOSIS — I10 Essential (primary) hypertension: Secondary | ICD-10-CM | POA: Diagnosis not present

## 2019-02-17 MED ORDER — AMLODIPINE BESYLATE 2.5 MG PO TABS: ORAL_TABLET | ORAL | 5 refills | Status: DC

## 2019-02-17 MED ORDER — SHINGRIX 50 MCG/0.5ML IM SUSR: 0.5000 mL | Freq: Once | INTRAMUSCULAR | 1 refills | Status: AC

## 2019-02-17 MED ORDER — AMLODIPINE BESYLATE 2.5 MG PO TABS: ORAL_TABLET | ORAL | 1 refills | Status: DC

## 2019-02-17 NOTE — Patient Instructions (Signed)

## 2019-02-17 NOTE — Progress Notes (Signed)
   Subjective:    Patient ID: Hannah Roy, female    DOB: 08-12-1940, 78 y.o.   MRN: [id number]  Hypertension This is a chronic problem. Associated symptoms include blurred vision. Pertinent negatives include no chest pain, headaches or shortness of breath. (Pt here today for 3 week follow up. Pt states no she is having no issues at this time. ) There are no compliance problems (pt Amlodipine tablets are to small to cut in half and pt states when she cuts the tablet in half it turns to powder).   Patient take medication blood pressure readings in the morning are borderline elevated but later in the day are actually on the low end she is on a low-dose of medicine the losartan 50 mg in the morning and a half of the amlodipine 2.5 mg in the evening Did discuss today potential symptoms and when to call us here at the office when to follow-up at the office when to go to cardiology as well as when to go to the ER Pt would like to know what kind of symptoms to look for since she had open heart surgery.   Review of Systems  Constitutional: Negative for activity change, fatigue and fever.  HENT: Negative for congestion and rhinorrhea.   Eyes: Positive for blurred vision.  Respiratory: Negative for cough, chest tightness and shortness of breath.   Cardiovascular: Negative for chest pain and leg swelling.  Gastrointestinal: Negative for abdominal pain and nausea.  Skin: Negative for color change.  Neurological: Negative for dizziness and headaches.  Psychiatric/Behavioral: Negative for agitation and behavioral problems.       Objective:   Physical Exam Vitals signs reviewed.  Constitutional:      General: She is not in acute distress. HENT:     Head: Normocephalic and atraumatic.  Eyes:     General:        Right eye: No discharge.        Left eye: No discharge.  Neck:     Trachea: No tracheal deviation.  Cardiovascular:     Rate and Rhythm: Normal rate and regular rhythm.     Heart sounds:  Normal heart sounds. No murmur.  Pulmonary:     Effort: Pulmonary effort is normal. No respiratory distress.     Breath sounds: Normal breath sounds.  Lymphadenopathy:     Cervical: No cervical adenopathy.  Skin:    General: Skin is warm and dry.  Neurological:     Mental Status: She is alert.     Coordination: Coordination normal.  Psychiatric:        Behavior: Behavior normal.      Her blood pressure today on her home cuff was good.  it correlates well with her readings from her home monitor which she brought today     Assessment & Plan:  HTN good control on current medication do not increase medicine currently.  Follow-up by early spring.  Follow-up with cardiology regular basis.

## 2019-02-28 DIAGNOSIS — H25813 Combined forms of age-related cataract, bilateral: Secondary | ICD-10-CM | POA: Diagnosis not present

## 2019-03-03 ENCOUNTER — Ambulatory Visit (INDEPENDENT_AMBULATORY_CARE_PROVIDER_SITE_OTHER): Payer: Medicare Other | Admitting: *Deleted

## 2019-03-03 ENCOUNTER — Other Ambulatory Visit: Payer: Self-pay

## 2019-03-03 DIAGNOSIS — Z953 Presence of xenogenic heart valve: Secondary | ICD-10-CM

## 2019-03-03 DIAGNOSIS — Z5181 Encounter for therapeutic drug level monitoring: Secondary | ICD-10-CM

## 2019-03-03 DIAGNOSIS — I4819 Other persistent atrial fibrillation: Secondary | ICD-10-CM

## 2019-03-03 LAB — POCT INR: INR: 2.6 (ref 2.0–3.0)

## 2019-03-03 NOTE — Patient Instructions (Signed)
Continue warfarin 2 tablets daily except 1 1/2 tablets on Sundays, Tuesdays and Thursdays Recheck in 4 wks

## 2019-03-04 DIAGNOSIS — H25043 Posterior subcapsular polar age-related cataract, bilateral: Secondary | ICD-10-CM | POA: Diagnosis not present

## 2019-03-04 DIAGNOSIS — H2511 Age-related nuclear cataract, right eye: Secondary | ICD-10-CM | POA: Diagnosis not present

## 2019-03-04 DIAGNOSIS — H2513 Age-related nuclear cataract, bilateral: Secondary | ICD-10-CM | POA: Diagnosis not present

## 2019-03-04 DIAGNOSIS — H25013 Cortical age-related cataract, bilateral: Secondary | ICD-10-CM | POA: Diagnosis not present

## 2019-03-04 DIAGNOSIS — H18413 Arcus senilis, bilateral: Secondary | ICD-10-CM | POA: Diagnosis not present

## 2019-03-24 ENCOUNTER — Encounter: Payer: Self-pay | Admitting: Cardiovascular Disease

## 2019-03-24 ENCOUNTER — Other Ambulatory Visit: Payer: Self-pay

## 2019-03-24 ENCOUNTER — Ambulatory Visit (INDEPENDENT_AMBULATORY_CARE_PROVIDER_SITE_OTHER): Payer: Medicare Other | Admitting: Cardiovascular Disease

## 2019-03-24 VITALS — BP 128/44 | HR 83 | Temp 97.6°F | Ht 61.0 in | Wt 119.0 lb

## 2019-03-24 DIAGNOSIS — I4819 Other persistent atrial fibrillation: Secondary | ICD-10-CM

## 2019-03-24 DIAGNOSIS — I38 Endocarditis, valve unspecified: Secondary | ICD-10-CM

## 2019-03-24 DIAGNOSIS — Z953 Presence of xenogenic heart valve: Secondary | ICD-10-CM

## 2019-03-24 DIAGNOSIS — I313 Pericardial effusion (noninflammatory): Secondary | ICD-10-CM | POA: Diagnosis not present

## 2019-03-24 DIAGNOSIS — I10 Essential (primary) hypertension: Secondary | ICD-10-CM | POA: Diagnosis not present

## 2019-03-24 DIAGNOSIS — I3139 Other pericardial effusion (noninflammatory): Secondary | ICD-10-CM

## 2019-03-24 NOTE — Progress Notes (Addendum)
SUBJECTIVE:  The patient presents for routine follow-up.  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  She was hospitalized for anemia secondary to an acute GI bleed in July 2020.  She is doing well.  She denies chest pain and palpitations as well as leg swelling, orthopnea and paroxysmal external dyspnea.  She does vacuuming and cleaning around the house without difficulty.  She continues to participate in cardiac rehabilitation maintenance therapy.    Social history:She is originally from [place], [place].  She has a strong faith.She moved [place] the [place] the age of 40. 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 He daughter has renal cancer.  Review of Systems: As per "subjective", otherwise negative.  Allergies  Allergen Reactions  . Bee Venom Swelling    Welps  . Boniva [Ibandronic Acid] Nausea And Vomiting and Other (See Comments)    Upset stomach, flu like symptoms   . Lisinopril Cough  . Latex Rash    Current Outpatient Medications  Medication Sig Dispense Refill  . amLODipine (NORVASC) 2.5 MG tablet 1/2 q evening 45 tablet 1  . losartan (COZAAR) 50 MG tablet Take 50 mg am and 25 mg pm 135 tablet 3  . OVER THE COUNTER MEDICATION Stool softner    . pantoprazole (PROTONIX) 40 MG tablet Take 1 tablet (40 mg total) by mouth daily before breakfast. 30 tablet 11  . rosuvastatin (CRESTOR) 5 MG  tablet Take one tablet on Monday, Wednesday and fridays. 12 tablet 6  . warfarin (COUMADIN) 2.5 MG tablet Take 2 tablets daily except 1 1/2 tablets on Sundays, Tuesdays and Thursdays 60 tablet 3   No current facility-administered medications for this visit.    Past Medical History:  Diagnosis Date  . Aortic atherosclerosis ([place])   . Aortic insufficiency, rheumatic   . 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 Atrial fibrillation ([place]) 05/10/2018  . Colon polyps   . Essential hypertension, benign   . History of seasonal allergies   . Hypertension   . Hypothyroidism   . Mitral stenosis with regurgitation, rheumatic   . Osteopenia 2006  . Persistent atrial fibrillation ([place])   . 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   21 mm [facility] Ease stented bovine pericardial tissue valve  . S/P ascending aortic aneurysm repair 09/05/2018   28 mm Hemashield platinum supracoronary straight graft  . S/P Maze operation for atrial fibrillation 09/05/2018   Complete bilateral atrial lesion set using bipolar radiofrequency and cryothermy ablation with clipping of LA appendage  . 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 Tricuspid regurgitation     Past Surgical History:  Procedure Laterality Date  . AORTIC VALVE REPLACEMENT N/A 09/05/2018   Procedure: AORTIC VALVE REPLACEMENT (AVR) USING MAGNA EASE 21MM AOTIC BIOPROSTHESIS VALVE;  Surgeon: Rexene Alberts, MD;  Location: [place];  Service: Open Heart Surgery;  Laterality: N/A;  . APPENDECTOMY    . CLIPPING OF ATRIAL APPENDAGE  09/05/2018   Procedure: Clipping Of Atrial Appendage;  Surgeon: Rexene Alberts, MD;  Location: [place];  Service: Open Heart Surgery;;  . COLONOSCOPY  03/23/2011   repeat in 5 years,Procedure: COLONOSCOPY;  Surgeon: Rogene Houston,  MD;  [facility];  Service: Endoscopy;  Laterality: N/A;  1:00  . COLONOSCOPY N/A 11/09/2016   Procedure: COLONOSCOPY;  Surgeon: Rogene Houston, MD;  [facility];  Service: Endoscopy;  Laterality: N/A;  1030  . ESOPHAGOGASTRODUODENOSCOPY N/A 10/10/2018   Procedure: ESOPHAGOGASTRODUODENOSCOPY (EGD);  Surgeon: Doran Stabler, MD;  Location: [place];  Service: Gastroenterology;  Laterality: N/A;  . ESOPHAGOGASTRODUODENOSCOPY (EGD) WITH PROPOFOL N/A 10/08/2018   Procedure: ESOPHAGOGASTRODUODENOSCOPY (EGD) WITH PROPOFOL;  Surgeon: Danie Binder, MD;  [facility];  Service: Endoscopy;  Laterality: N/A;  . ESOPHAGOGASTRODUODENOSCOPY (EGD) WITH PROPOFOL N/A 11/15/2018   Procedure: ESOPHAGOGASTRODUODENOSCOPY (EGD) WITH PROPOFOL;  Surgeon: Rogene Houston, MD;  [facility];  Service: Endoscopy;  Laterality: N/A;  12:55  . HOT HEMOSTASIS N/A 10/10/2018   Procedure: HOT HEMOSTASIS (ARGON PLASMA COAGULATION/BICAP);  Surgeon: Doran Stabler, MD;  Location: [place];  Service: Gastroenterology;  Laterality: N/A;  . IR ANGIOGRAM SELECTIVE EACH ADDITIONAL VESSEL  10/08/2018  . IR ANGIOGRAM VISCERAL SELECTIVE  10/08/2018  . IR EMBO ART  VEN HEMORR LYMPH EXTRAV  INC GUIDE ROADMAPPING  10/08/2018  . IR US GUIDE VASC ACCESS RIGHT  10/08/2018  . MAZE N/A 09/05/2018   Procedure: MAZE;  Surgeon: Rexene Alberts, MD;  Location: [place];  Service: Open Heart Surgery;  Laterality: N/A;  . MITRAL VALVE REPLACEMENT N/A 09/05/2018   Procedure: MITRAL VALVE (MV) REPLACEMENT USING MAGNA MITRAL EASE 29MM BIOPROSTHESIS VALVE;  Surgeon: Rexene Alberts, MD;  Location: [place];  Service: Open Heart Surgery;  Laterality: N/A;  . REPLACEMENT ASCENDING AORTA N/A 09/05/2018   Procedure: SUPRACORONARY STRAIGHT GRAFT REPLACEMENT OF ASCENDING AORTA;  Surgeon: Rexene Alberts, MD;  Location: [place];  Service: Open Heart Surgery;  Laterality: N/A;  . RIGHT/LEFT HEART CATH AND CORONARY  ANGIOGRAPHY N/A 08/29/2018   Procedure: RIGHT/LEFT HEART CATH AND CORONARY ANGIOGRAPHY;  Surgeon: Lorretta Harp, MD;  Location: [facility];  Service: Cardiovascular;  Laterality: N/A;  . TEE WITHOUT CARDIOVERSION N/A 08/29/2018   Procedure: TRANSESOPHAGEAL ECHOCARDIOGRAM (TEE);  Surgeon: Skeet Latch, MD;  Location: [place];  Service: Cardiovascular;  Laterality: N/A;  . TRICUSPID VALVE REPLACEMENT N/A 09/05/2018   Procedure: TRICUSPID VALVE REPAIR USING EDWARDS MC3 TRICUSPID ANNULOPLASTY RING SIZE T28;  Surgeon: Rexene Alberts, MD;  Location: [place];  Service: Open Heart Surgery;  Laterality: N/A;  . TUBAL LIGATION      Social History   Socioeconomic History  . Marital status: Married    Spouse name: Not on file  . Number of children: Not on file  . Years of education: Not on file  . Highest education level: Not on file  Occupational History  . Not on file  Tobacco Use  . Smoking status: Former Smoker    Packs/day: 0.50    Years: 10.00    Pack years: 5.00    Types: Cigarettes  . Smokeless tobacco: Never Used  Substance and Sexual Activity  . Alcohol use: No  . Drug use: No  . Sexual  activity: Yes  Other Topics Concern  . Not on file  Social History Narrative  . Not on file   Social Determinants of Health   Financial Resource Strain: Low Risk   . Difficulty of Paying Living Expenses: Not very hard  Food Insecurity: No Food Insecurity  . Worried About Charity fundraiser in the Last Year: Never true  . Ran Out of Food in the Last Year: Never true  Transportation Needs: No Transportation Needs  . Lack of Transportation (Medical): No  . Lack of Transportation (Non-Medical): No  Physical Activity: Insufficiently Active  . Days of Exercise per Week: 3 days  . Minutes of Exercise per Session: 20 min  Stress: No Stress Concern Present  . Feeling of Stress : Only a little  Social Connections: Slightly Isolated  . Frequency of Communication with Friends  and Family: More than three times a week  . Frequency of Social Gatherings with Friends and Family: Three times a week  . Attends Religious Services: 1 to 4 times per year  . Active Member of Clubs or Organizations: No  . Attends Archivist Meetings: Never  . Marital Status: Married  Human resources officer Violence: Not At Risk  . Fear of Current or Ex-Partner: No  . Emotionally Abused: No  . Physically Abused: No  . Sexually Abused: No     Vitals:   03/24/19 0834  BP: (!) 161/88  Pulse: 83  Temp: 97.6 F (36.4 C)  SpO2: 97%  Weight: 119 lb (54 kg)  Height: 5\' 1"  (1.549 m)    Wt Readings from Last 3 Encounters:  03/24/19 119 lb (54 kg)  02/17/19 117 lb 12.8 oz (53.4 kg)  02/03/19 116 lb 6.5 oz (52.8 kg)     PHYSICAL EXAM General: NAD HEENT: Normal. Neck: No JVD, no thyromegaly. Lungs: Clear to auscultation bilaterally with normal respiratory effort. CV: Regular rate and rhythm, normal S1/S2, no [id number], 2/6 systolic murmur heard over bilateral upper sternal borders. No pretibial or periankle edema.  No carotid bruit.  Abdomen: Soft, nontender, no distention.  Neurologic: Alert and oriented.  Psych: Normal affect. Skin: Normal. Musculoskeletal: No gross deformities.      Labs: Lab Results  Component Value Date/Time   K 4.6 11/27/2018 09:23 AM   BUN 14 11/27/2018 09:23 AM   CREATININE 0.73 11/27/2018 09:23 AM   CREATININE 0.66 05/28/2014 10:07 AM   ALT 13 01/22/2019 09:58 AM   TSH 3.399 08/31/2018 09:54 PM   TSH 3.440 05/15/2018 11:22 AM   HGB 11.4 11/27/2018 09:23 AM     Lipids: Lab Results  Component Value Date/Time   LDLCALC 87 01/22/2019 09:58 AM   CHOL 160 01/22/2019 09:58 AM   TRIG 89 01/22/2019 09:58 AM   HDL 56 01/22/2019 09:58 AM      Echocardiogram 10/30/2018:  1. The left ventricle has normal systolic function, with an ejection fraction of 55-60%. The cavity size was normal. There is moderately increased left ventricular wall  thickness. Left ventricular diastolic Doppler parameters are indeterminate. 2. The right ventricle has low normal systolic function. The cavity was mildly enlarged. There is no increase in right ventricular wall thickness. 3. Left atrial size was severely dilated. 4. Right atrial size was severely dilated. 5. Small pericardial effusion. 6. The pericardial effusion is circumferential. 7. Mildly calcified tricuspid valve leaflets. 8. Mildly thickened tricuspid valve leaflets. 9. The mitral valve is abnormal. No evidence of mitral valve stenosis. 10. MAGNA MITRAL EASE 29MM BIOPROSTHESIS VALVE  is in the MV position. There is no significant perivalvular regurgitation. Mean gradient across the valve is 5 mmHg which is within expected range. 11. The tricuspid valve is abnormal. 12. EDWARDS MC3 TRICUSPID ANNULOPLASTY RING SIZE T28 is present. 13. The aortic valve is abnormal. Aortic valve regurgitation is trivial by color flow Doppler. No stenosis of the aortic valve. 14. MAGNA EASE 21MM AOTIC BIOPROSTHESIc VALVE is in the AV position. No perivalvular regurgitation, no significant gradients across the valve (mean grad 8 mmHg). 15. The aorta is normal in size and structure. 16. The aortic root is normal in size and structure. 17. Pulmonary hypertension is is not present, PASP is 24 mmHg.    ASSESSMENT AND PLAN:  1.  Valvular heart disease: Status post aortic and mitral bioprosthetic valve replacement and tricuspid valve repair with Maze procedure and repair of ascending aortic aneurysm by Dr. Roxy Manns.  Currently on warfarin.  She will need SBE prophylaxis prior to undergoing dental procedures.  2.  Persistent atrial fibrillation: Status post Maze procedure.  Symptomatically stable.  Continue warfarin.   3.  Hypertension: Blood pressure is elevated.    It is normally well controlled.  She rushed getting here BP was taken immediately after she arrived.  Blood pressure was rechecked and it was  128/44.  She is currently taking 2.5 mg of amlodipine every evening and 50 mg of losartan every morning and 25 mg every evening.  No changes.  4.  Pericardial effusion: Small by echocardiogram on 10/30/2018.   Disposition: Follow up 6 months virtual visit   Kate Sable, M.D., F.A.C.C.

## 2019-03-24 NOTE — Patient Instructions (Signed)
Medication Instructions:  Your physician recommends that you continue on your current medications as directed. Please refer to the Current Medication list given to you today.  *If you need a refill on your cardiac medications before your next appointment, please call your pharmacy*  Lab Work: None today If you have labs (blood work) drawn today and your tests are completely normal, you will receive your results only by: . MyChart Message (if you have MyChart) OR . A paper copy in the mail If you have any lab test that is abnormal or we need to change your treatment, we will call you to review the results.  Testing/Procedures: None today  Follow-Up: At [facility], you and your health needs are our priority.  As part of our continuing mission to provide you with exceptional heart care, we have created designated Provider Care Teams.  These Care Teams include your primary Cardiologist (physician) and Advanced Practice Providers (APPs -  Physician Assistants and Nurse Practitioners) who all work together to provide you with the care you need, when you need it.  Your next appointment:   6 month(s)  The format for your next appointment:   Virtual Visit   Provider:   Suresh Koneswaran, MD  Other Instructions None     Thank you for choosing [facility] !         

## 2019-03-31 ENCOUNTER — Ambulatory Visit (INDEPENDENT_AMBULATORY_CARE_PROVIDER_SITE_OTHER): Payer: Medicare Other | Admitting: *Deleted

## 2019-03-31 DIAGNOSIS — Z953 Presence of xenogenic heart valve: Secondary | ICD-10-CM

## 2019-03-31 DIAGNOSIS — Z5181 Encounter for therapeutic drug level monitoring: Secondary | ICD-10-CM

## 2019-03-31 DIAGNOSIS — I4819 Other persistent atrial fibrillation: Secondary | ICD-10-CM

## 2019-03-31 LAB — POCT INR: INR: 2.7 (ref 2.0–3.0)

## 2019-03-31 NOTE — Patient Instructions (Signed)
Continue warfarin 2 tablets daily except 1 1/2 tablets on Sundays, Tuesdays and Thursdays Recheck in 4 wks

## 2019-04-14 DIAGNOSIS — H2511 Age-related nuclear cataract, right eye: Secondary | ICD-10-CM | POA: Diagnosis not present

## 2019-04-15 DIAGNOSIS — H2512 Age-related nuclear cataract, left eye: Secondary | ICD-10-CM | POA: Diagnosis not present

## 2019-04-28 ENCOUNTER — Ambulatory Visit (INDEPENDENT_AMBULATORY_CARE_PROVIDER_SITE_OTHER): Payer: Medicare Other | Admitting: *Deleted

## 2019-04-28 ENCOUNTER — Other Ambulatory Visit: Payer: Self-pay

## 2019-04-28 DIAGNOSIS — Z5181 Encounter for therapeutic drug level monitoring: Secondary | ICD-10-CM | POA: Diagnosis not present

## 2019-04-28 DIAGNOSIS — I4819 Other persistent atrial fibrillation: Secondary | ICD-10-CM

## 2019-04-28 DIAGNOSIS — Z953 Presence of xenogenic heart valve: Secondary | ICD-10-CM | POA: Diagnosis not present

## 2019-04-28 LAB — POCT INR: INR: 2.9 (ref 2.0–3.0)

## 2019-04-28 MED ORDER — WARFARIN SODIUM 2.5 MG PO TABS: ORAL_TABLET | ORAL | 3 refills | Status: DC

## 2019-04-28 NOTE — Patient Instructions (Signed)
Continue warfarin 2 tablets daily except 1 1/2 tablets on Sundays, Tuesdays and Thursdays Recheck in 4 wks

## 2019-05-05 DIAGNOSIS — H2512 Age-related nuclear cataract, left eye: Secondary | ICD-10-CM | POA: Diagnosis not present

## 2019-05-22 DIAGNOSIS — Z23 Encounter for immunization: Secondary | ICD-10-CM | POA: Diagnosis not present

## 2019-05-29 ENCOUNTER — Other Ambulatory Visit: Payer: Self-pay | Admitting: Family Medicine

## 2019-05-30 NOTE — Telephone Encounter (Signed)
May do a quantity of 36 tablets with same directions with 1 refill thank you

## 2019-06-04 ENCOUNTER — Ambulatory Visit (INDEPENDENT_AMBULATORY_CARE_PROVIDER_SITE_OTHER): Payer: Medicare Other | Admitting: *Deleted

## 2019-06-04 ENCOUNTER — Other Ambulatory Visit: Payer: Self-pay

## 2019-06-04 DIAGNOSIS — I4819 Other persistent atrial fibrillation: Secondary | ICD-10-CM

## 2019-06-04 DIAGNOSIS — Z953 Presence of xenogenic heart valve: Secondary | ICD-10-CM

## 2019-06-04 DIAGNOSIS — Z5181 Encounter for therapeutic drug level monitoring: Secondary | ICD-10-CM

## 2019-06-04 LAB — POCT INR: INR: 2.2 (ref 2.0–3.0)

## 2019-06-04 NOTE — Patient Instructions (Signed)
Continue warfarin 2 tablets daily except 1 1/2 tablets on Sundays, Tuesdays and Thursdays Recheck in 4 wks

## 2019-06-17 ENCOUNTER — Other Ambulatory Visit: Payer: Self-pay

## 2019-06-17 ENCOUNTER — Ambulatory Visit (INDEPENDENT_AMBULATORY_CARE_PROVIDER_SITE_OTHER): Payer: Medicare Other | Admitting: Family Medicine

## 2019-06-17 ENCOUNTER — Encounter: Payer: Self-pay | Admitting: Family Medicine

## 2019-06-17 VITALS — BP 132/78 | Temp 98.2°F | Wt 121.0 lb

## 2019-06-17 DIAGNOSIS — B001 Herpesviral vesicular dermatitis: Secondary | ICD-10-CM

## 2019-06-17 DIAGNOSIS — Z79899 Other long term (current) drug therapy: Secondary | ICD-10-CM

## 2019-06-17 DIAGNOSIS — E785 Hyperlipidemia, unspecified: Secondary | ICD-10-CM | POA: Diagnosis not present

## 2019-06-17 DIAGNOSIS — I10 Essential (primary) hypertension: Secondary | ICD-10-CM | POA: Diagnosis not present

## 2019-06-17 MED ORDER — AMLODIPINE BESYLATE 2.5 MG PO TABS: ORAL_TABLET | ORAL | 1 refills | Status: DC

## 2019-06-17 MED ORDER — VALACYCLOVIR HCL 1 G PO TABS: ORAL_TABLET | ORAL | 6 refills | Status: DC

## 2019-06-17 MED ORDER — DENAVIR 1 % EX CREA: 1.0000 "application " | TOPICAL_CREAM | CUTANEOUS | 11 refills | Status: DC

## 2019-06-17 MED ORDER — ROSUVASTATIN CALCIUM 5 MG PO TABS: ORAL_TABLET | ORAL | 1 refills | Status: DC

## 2019-06-17 MED ORDER — LOSARTAN POTASSIUM 50 MG PO TABS: ORAL_TABLET | ORAL | 1 refills | Status: DC

## 2019-06-17 NOTE — Patient Instructions (Signed)

## 2019-06-17 NOTE — Progress Notes (Signed)
   Subjective:    Patient ID: Hannah Roy, female    DOB: 1940-04-23, 79 y.o.   MRN: [id number]  Hypertension This is a chronic problem. The current episode started more than 1 year ago. Pertinent negatives include no chest pain or shortness of breath. Risk factors for coronary artery disease include dyslipidemia and post-menopausal state. There are no compliance problems.     Patient states she is having problems with fever blisters and would like a refill on Denavir and valtrex- patient would like large tube and enough pills for several outbreaks.  Discuss sleep issues  Review of Systems  Constitutional: Negative for activity change, appetite change and fatigue.  HENT: Negative for congestion and rhinorrhea.   Respiratory: Negative for cough and shortness of breath.   Cardiovascular: Negative for chest pain and leg swelling.  Gastrointestinal: Negative for abdominal pain and diarrhea.  Endocrine: Negative for polydipsia and polyphagia.  Skin: Negative for color change.  Neurological: Negative for dizziness and weakness.  Psychiatric/Behavioral: Negative for behavioral problems and confusion.       Objective:   Physical Exam Vitals reviewed.  Constitutional:      General: She is not in acute distress. HENT:     Head: Normocephalic and atraumatic.  Eyes:     General:        Right eye: No discharge.        Left eye: No discharge.  Neck:     Trachea: No tracheal deviation.  Cardiovascular:     Rate and Rhythm: Normal rate and regular rhythm.     Heart sounds: Normal heart sounds. No murmur.  Pulmonary:     Effort: Pulmonary effort is normal. No respiratory distress.     Breath sounds: Normal breath sounds.  Lymphadenopathy:     Cervical: No cervical adenopathy.  Skin:    General: Skin is warm and dry.  Neurological:     Mental Status: She is alert.     Coordination: Coordination normal.  Psychiatric:        Behavior: Behavior normal.           Assessment &  Plan:  1. Essential hypertension, benign Blood pressure good control continue current measures she is only using losartan once a day and amlodipine as well - Basic metabolic panel - Lipid panel - CBC with Differential/Platelet  2. Fever blister Call in Valtrex she is having frequent outbreaks she prefers to just utilize treatments when it occurs  3. Hyperlipidemia, unspecified hyperlipidemia type Hyperlipidemia continue current medication check lab work await results - Lipid panel  4. High risk medication use Because of her blood pressure medicine very important continue with the medicine but also check metabolic 7 minimize salt stay active - Basic metabolic panel - CBC with Differential/Platelet  Valve replacement she is doing well.  She is on anticoagulant therefore check CBC.  She does try to do physical activity on a regular basis.  30 minutes spent with patient between reviewing of her chart plus also preparation and documentation ordering labs as well as medications and physical exam Prescription Follow-up 6 months

## 2019-06-20 ENCOUNTER — Telehealth: Payer: Self-pay | Admitting: Family Medicine

## 2019-06-20 DIAGNOSIS — Z23 Encounter for immunization: Secondary | ICD-10-CM | POA: Diagnosis not present

## 2019-06-20 NOTE — Telephone Encounter (Signed)
Denavir 1% cream has been denied by insurance. Attempted to contact patient to let her know; left message to return call

## 2019-06-20 NOTE — Telephone Encounter (Signed)
An appeal form was attached to denial letter. Appeal form filled out and faxed to [facility].

## 2019-06-23 NOTE — Telephone Encounter (Signed)
Pt notified and await decision from insurance

## 2019-06-25 ENCOUNTER — Other Ambulatory Visit: Payer: Self-pay | Admitting: *Deleted

## 2019-06-25 NOTE — Telephone Encounter (Signed)
Fax from [facility]. Denavir cream was approved after reviewing the appeal. However I called [facility] to make sure med would go through before notifing pt and it did go through but the cost is $645.

## 2019-06-25 NOTE — Telephone Encounter (Signed)
Nurses Please convince the patient she does not need the Denavir cream I would not recommend spending 600+ dollars on this.  The Valtrex will help her and the studies show it does the best Should she have ongoing questions or concerns let me know

## 2019-06-25 NOTE — Telephone Encounter (Signed)
Called and discussed with pt. Pt verbalized understanding and states she will stick with valtrex.

## 2019-06-26 ENCOUNTER — Ambulatory Visit (INDEPENDENT_AMBULATORY_CARE_PROVIDER_SITE_OTHER): Payer: Medicare Other | Admitting: Family Medicine

## 2019-06-26 ENCOUNTER — Emergency Department (HOSPITAL_COMMUNITY): Payer: Medicare Other

## 2019-06-26 ENCOUNTER — Telehealth: Payer: Self-pay

## 2019-06-26 ENCOUNTER — Emergency Department (HOSPITAL_COMMUNITY)
Admission: EM | Admit: 2019-06-26 | Discharge: 2019-06-26 | Disposition: A | Discharge status: Home / Self Care | Payer: Medicare Other | Attending: Emergency Medicine | Admitting: Emergency Medicine

## 2019-06-26 ENCOUNTER — Encounter: Payer: Self-pay | Admitting: Family Medicine

## 2019-06-26 ENCOUNTER — Other Ambulatory Visit: Payer: Self-pay

## 2019-06-26 ENCOUNTER — Encounter (HOSPITAL_COMMUNITY): Payer: Self-pay | Admitting: Emergency Medicine

## 2019-06-26 VITALS — BP 156/104 | HR 121 | Temp 97.6°F | Ht 61.0 in | Wt 120.4 lb

## 2019-06-26 DIAGNOSIS — E039 Hypothyroidism, unspecified: Secondary | ICD-10-CM | POA: Diagnosis not present

## 2019-06-26 DIAGNOSIS — R000 Tachycardia, unspecified: Secondary | ICD-10-CM

## 2019-06-26 DIAGNOSIS — Z91040 Latex allergy status: Secondary | ICD-10-CM | POA: Diagnosis not present

## 2019-06-26 DIAGNOSIS — I110 Hypertensive heart disease with heart failure: Secondary | ICD-10-CM | POA: Diagnosis not present

## 2019-06-26 DIAGNOSIS — R0602 Shortness of breath: Secondary | ICD-10-CM | POA: Diagnosis not present

## 2019-06-26 DIAGNOSIS — Z87891 Personal history of nicotine dependence: Secondary | ICD-10-CM | POA: Insufficient documentation

## 2019-06-26 DIAGNOSIS — I5032 Chronic diastolic (congestive) heart failure: Secondary | ICD-10-CM | POA: Diagnosis not present

## 2019-06-26 DIAGNOSIS — Z79899 Other long term (current) drug therapy: Secondary | ICD-10-CM | POA: Insufficient documentation

## 2019-06-26 DIAGNOSIS — Z954 Presence of other heart-valve replacement: Secondary | ICD-10-CM | POA: Insufficient documentation

## 2019-06-26 DIAGNOSIS — I4892 Unspecified atrial flutter: Secondary | ICD-10-CM | POA: Diagnosis not present

## 2019-06-26 LAB — CBC WITH DIFFERENTIAL/PLATELET
Abs Immature Granulocytes: 0.01 10*3/uL (ref 0.00–0.07)
Basophils Absolute: 0 10*3/uL (ref 0.0–0.1)
Basophils Relative: 1 %
Eosinophils Absolute: 0.1 10*3/uL (ref 0.0–0.5)
Eosinophils Relative: 3 %
HCT: 38.6 % (ref 36.0–46.0)
Hemoglobin: 12.4 g/dL (ref 12.0–15.0)
Immature Granulocytes: 0 %
Lymphocytes Relative: 27 %
Lymphs Abs: 1.1 10*3/uL (ref 0.7–4.0)
MCH: 30.5 pg (ref 26.0–34.0)
MCHC: 32.1 g/dL (ref 30.0–36.0)
MCV: 95.1 fL (ref 80.0–100.0)
Monocytes Absolute: 0.3 10*3/uL (ref 0.1–1.0)
Monocytes Relative: 7 %
Neutro Abs: 2.7 10*3/uL (ref 1.7–7.7)
Neutrophils Relative %: 62 %
Platelets: 223 10*3/uL (ref 150–400)
RBC: 4.06 MIL/uL (ref 3.87–5.11)
RDW: 12.8 % (ref 11.5–15.5)
WBC: 4.2 10*3/uL (ref 4.0–10.5)
nRBC: 0 % (ref 0.0–0.2)

## 2019-06-26 LAB — COMPREHENSIVE METABOLIC PANEL
ALT: 16 U/L (ref 0–44)
AST: 18 U/L (ref 15–41)
Albumin: 4.5 g/dL (ref 3.5–5.0)
Alkaline Phosphatase: 70 U/L (ref 38–126)
Anion gap: 11 (ref 5–15)
BUN: 17 mg/dL (ref 8–23)
CO2: 25 mmol/L (ref 22–32)
Calcium: 9.1 mg/dL (ref 8.9–10.3)
Chloride: 106 mmol/L (ref 98–111)
Creatinine, Ser: 0.64 mg/dL (ref 0.44–1.00)
GFR calc Af Amer: >60 mL/min (ref >60)
GFR calc non Af Amer: >60 mL/min (ref >60)
Glucose, Bld: 95 mg/dL (ref 70–99)
Potassium: 3.5 mmol/L (ref 3.5–5.1)
Sodium: 142 mmol/L (ref 135–145)
Total Bilirubin: 1.2 mg/dL (ref 0.3–1.2)
Total Protein: 8.1 g/dL (ref 6.5–8.1)

## 2019-06-26 LAB — D-DIMER, QUANTITATIVE: D-Dimer, Quant: 1.64 ug/mL-FEU — ABNORMAL HIGH (ref 0.00–0.50)

## 2019-06-26 LAB — TSH: TSH: 1.644 u[IU]/mL (ref 0.350–4.500)

## 2019-06-26 LAB — PROTIME-INR
INR: 2.1 — ABNORMAL HIGH (ref 0.8–1.2)
Prothrombin Time: 23.9 seconds — ABNORMAL HIGH (ref 11.4–15.2)

## 2019-06-26 LAB — TROPONIN I (HIGH SENSITIVITY): Troponin I (High Sensitivity): 9 ng/L (ref <18)

## 2019-06-26 MED ORDER — IOHEXOL 350 MG/ML SOLN
75.0000 mL | Freq: Once | INTRAVENOUS | Status: AC | PRN
  Administered 2019-06-26: 75 mL via INTRAVENOUS

## 2019-06-26 MED ORDER — METOPROLOL TARTRATE 25 MG PO TABS: 25.0000 mg | ORAL_TABLET | Freq: Two times a day (BID) | ORAL | 0 refills | Status: DC

## 2019-06-26 MED ORDER — METOPROLOL TARTRATE 5 MG/5ML IV SOLN
5.0000 mg | Freq: Once | INTRAVENOUS | Status: AC
  Administered 2019-06-26: 5 mg via INTRAVENOUS
  Filled 2019-06-26: qty 5

## 2019-06-26 NOTE — ED Provider Notes (Addendum)
[facility] EMERGENCY DEPARTMENT Provider Note   CSN: [id number] Arrival date & time: 06/26/19  1107     History Chief Complaint  Patient presents with  . Tachycardia    Hannah Roy is a 79 y.o. female.  Chief complaint: heart racing since yesterday.  Seen by her primary care doctor today and sent to the emergency department.  Status post aortic valve replacement by Dr. Ricard Dillon on 09/05/27.  ?  Chest pain.  No diaphoresis or nausea.  Symptoms are not associated with any activity.  No recent travel or immobilization.        Past Medical History:  Diagnosis Date  . Aortic atherosclerosis ([place])   . Aortic insufficiency, rheumatic   . 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 Atrial fibrillation ([place]) 05/10/2018  . Colon polyps   . Essential hypertension, benign   . History of seasonal allergies   . Hypertension   . Hypothyroidism   . Mitral stenosis with regurgitation, rheumatic   . Osteopenia 2006  . Persistent atrial fibrillation ([place])   . 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   21 mm [facility] Ease stented bovine pericardial tissue valve  . S/P ascending aortic aneurysm repair 09/05/2018   28 mm Hemashield platinum supracoronary straight graft  . S/P Maze operation for atrial fibrillation 09/05/2018   Complete bilateral atrial lesion set using bipolar radiofrequency and cryothermy ablation with clipping of LA appendage  . 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   29 mm [facility] Mitral stented bovine pericardial tissue valve  . 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 Tricuspid regurgitation     Patient Active Problem List   Diagnosis Date Noted  . Duodenal ulcer 10/28/2018  . Pericardial effusion 10/28/2018  . UGI bleed 10/14/2018  . Paroxysmal A-fib ([place])   . Melena   . Symptomatic anemia 10/07/2018  . Encounter for therapeutic drug monitoring 09/24/2018  . Dysphagia  09/23/2018  . Hoarseness of voice 09/23/2018  . 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  . 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 S/P Maze operation for atrial fibrillation 09/05/2018  . S/P ascending aortic aneurysm repair 09/05/2018  . Orthostatic hypotension 09/01/2018  . Aortic insufficiency, rheumatic   . Mitral stenosis with regurgitation, rheumatic   . Tricuspid regurgitation   . 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 Orthostasis 08/31/2018  . Acute on chronic diastolic heart failure ([place]) 08/31/2018  . Rheumatic heart disease   . Persistent atrial fibrillation ([place])   . Atrial fibrillation with RVR ([place]) 05/10/2018  . Hypothyroidism   . Aortic atherosclerosis ([place]) 05/07/2018  . History of colonic polyps 08/03/2016  . Osteoarthritis of right knee 10/17/2013  . Allergic rhinitis 07/30/2012  . 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 Osteopenia 07/30/2012  . Essential hypertension, benign     Past Surgical History:  Procedure Laterality Date  . AORTIC VALVE REPLACEMENT N/A 09/05/2018   Procedure: AORTIC VALVE REPLACEMENT (AVR) USING MAGNA EASE 21MM AOTIC BIOPROSTHESIS VALVE;  Surgeon: Rexene Alberts, MD;  Location: [place];  Service: Open Heart Surgery;  Laterality: N/A;  . APPENDECTOMY    . CLIPPING OF ATRIAL APPENDAGE  09/05/2018   Procedure: Clipping Of Atrial Appendage;  Surgeon: Rexene Alberts, MD;  [facility];  Service: Open Heart Surgery;;  . COLONOSCOPY  03/23/2011   repeat in 5  years,Procedure: COLONOSCOPY;  Surgeon: Rogene Houston, MD;  [facility];  Service: Endoscopy;  Laterality: N/A;  1:00  . COLONOSCOPY N/A 11/09/2016   Procedure: COLONOSCOPY;  Surgeon: Rogene Houston, MD;  [facility];  Service: Endoscopy;  Laterality: N/A;  1030  . ESOPHAGOGASTRODUODENOSCOPY N/A 10/10/2018   Procedure: ESOPHAGOGASTRODUODENOSCOPY (EGD);  Surgeon: Doran Stabler, MD;  Location: [place];  Service: Gastroenterology;  Laterality: N/A;  . ESOPHAGOGASTRODUODENOSCOPY (EGD) WITH PROPOFOL N/A 10/08/2018   Procedure: ESOPHAGOGASTRODUODENOSCOPY (EGD) WITH PROPOFOL;  Surgeon: Danie Binder, MD;  [facility];  Service: Endoscopy;  Laterality: N/A;  . ESOPHAGOGASTRODUODENOSCOPY (EGD) WITH PROPOFOL N/A 11/15/2018   Procedure: ESOPHAGOGASTRODUODENOSCOPY (EGD) WITH PROPOFOL;  Surgeon: Rogene Houston, MD;  [facility];  Service: Endoscopy;  Laterality: N/A;  12:55  . HOT HEMOSTASIS N/A 10/10/2018   Procedure: HOT HEMOSTASIS (ARGON PLASMA COAGULATION/BICAP);  Surgeon: Doran Stabler, MD;  Location: [place];  Service: Gastroenterology;  Laterality: N/A;  . IR ANGIOGRAM SELECTIVE EACH ADDITIONAL VESSEL  10/08/2018  . IR ANGIOGRAM VISCERAL SELECTIVE  10/08/2018  . IR EMBO ART  VEN HEMORR LYMPH EXTRAV  INC GUIDE ROADMAPPING  10/08/2018  . IR US GUIDE VASC ACCESS RIGHT  10/08/2018  . MAZE N/A 09/05/2018   Procedure: MAZE;  Surgeon: Rexene Alberts, MD;  Location: [place];  Service: Open Heart Surgery;  Laterality: N/A;  . MITRAL VALVE REPLACEMENT N/A 09/05/2018   Procedure: MITRAL VALVE (MV) REPLACEMENT USING MAGNA MITRAL EASE 29MM BIOPROSTHESIS VALVE;  Surgeon: Rexene Alberts, MD;  Location: [place];  Service: Open Heart Surgery;  Laterality: N/A;  . REPLACEMENT ASCENDING AORTA N/A 09/05/2018   Procedure: SUPRACORONARY STRAIGHT GRAFT REPLACEMENT OF ASCENDING AORTA;  Surgeon: Rexene Alberts, MD;  Location: [place];  Service: Open Heart Surgery;  Laterality: N/A;  . RIGHT/LEFT HEART CATH AND CORONARY ANGIOGRAPHY N/A 08/29/2018   Procedure: RIGHT/LEFT HEART CATH AND CORONARY ANGIOGRAPHY;  Surgeon: Lorretta Harp, MD;  Location: [place] CV [facility];  Service: Cardiovascular;  Laterality: N/A;  . TEE WITHOUT CARDIOVERSION N/A 08/29/2018   Procedure: TRANSESOPHAGEAL ECHOCARDIOGRAM (TEE);  Surgeon: Skeet Latch, MD;  Location: [place];  Service: Cardiovascular;  Laterality: N/A;  . TRICUSPID VALVE REPLACEMENT N/A 09/05/2018   Procedure: TRICUSPID VALVE REPAIR USING EDWARDS MC3 TRICUSPID ANNULOPLASTY RING SIZE T28;  Surgeon: Rexene Alberts, MD;  Location: [place];  Service: Open Heart Surgery;  Laterality: N/A;  . TUBAL LIGATION       OB History   No obstetric history on file.     Family History  Problem Relation Age of Onset  . Colon cancer Daughter        about 81    Social History   Tobacco Use  . Smoking status: Former Smoker    Packs/day: 0.50    Years: 10.00    Pack years: 5.00    Types: Cigarettes  . Smokeless tobacco: Never Used  Substance Use Topics  . Alcohol use: No  . Drug use: No    Home Medications Prior to Admission medications   Medication Sig Start Date End Date Taking? Authorizing Provider  acetaminophen (TYLENOL) 325 MG tablet Take 650 mg by mouth every 6 (six) hours as needed for fever.   Yes [provider]  amLODipine (NORVASC) 2.5 MG tablet 1/2 q evening 06/17/19  Yes Luking, Elayne Snare, MD  losartan (COZAAR) 50 MG tablet Take 50 mg am 06/17/19  Yes Luking,  Scott A, MD  pantoprazole (PROTONIX) 40 MG tablet Take 1 tablet (40 mg total) by mouth daily before breakfast. 11/15/18 06/26/19 Yes Rehman, Mechele Dawley, MD  rosuvastatin (CRESTOR) 5 MG tablet TAKE 1 TABLET BY MOUTH ON MONDAY, [facility] AND FRIDAY 06/17/19  Yes Kathyrn Drown, MD  warfarin (COUMADIN) 2.5 MG tablet Take 2 tablets daily except 1 1/2 tablets on Sundays, Tuesdays and Thursdays 04/28/19  Yes Herminio Commons, MD  metoprolol tartrate (LOPRESSOR) 25 MG tablet Take 1 tablet (25 mg total) by mouth 2 (two) times daily. 06/26/19   Nat Christen, MD  valACYclovir (VALTREX) 1000 MG tablet 2 po twice a day for one day as needed for fever blisters Patient not taking: Reported on 06/26/2019 06/17/19   Kathyrn Drown, MD    Allergies    Bee venom, Boniva [ibandronic acid], Lisinopril, and Latex  Review of Systems   Review  of Systems  All other systems reviewed and are negative.   Physical Exam Updated Vital Signs BP (!) 137/96   Pulse (!) 117   Temp 98 F (36.7 C) (Oral)   Resp 18   Ht 5\' 1"  (1.549 m)   Wt 54.4 kg   SpO2 97%   BMI 22.67 kg/m   Physical Exam Vitals and nursing note reviewed.  Constitutional:      Appearance: She is well-developed.     Comments: nad  HENT:     Head: Normocephalic and atraumatic.  Eyes:     Conjunctiva/sclera: Conjunctivae normal.  Cardiovascular:     Rate and Rhythm: Regular rhythm. Tachycardia present.  Pulmonary:     Effort: Pulmonary effort is normal.     Breath sounds: Normal breath sounds.  Abdominal:     General: Bowel sounds are normal.     Palpations: Abdomen is soft.  Musculoskeletal:        General: Normal range of motion.     Cervical back: Neck supple.  Skin:    General: Skin is warm and dry.  Neurological:     General: No focal deficit present.     Mental Status: She is alert and oriented to person, place, and time.  Psychiatric:        Behavior: Behavior normal.     ED Results / Procedures / Treatments   Labs (all labs ordered are listed, but only abnormal results are displayed) Labs Reviewed  D-DIMER, QUANTITATIVE (NOT AT [facility]) - Abnormal; Notable for the following components:      Result Value   D-Dimer, Quant 1.64 (*)    All other components within normal limits  CBC WITH DIFFERENTIAL/PLATELET  COMPREHENSIVE METABOLIC PANEL  TSH  PROTIME-INR  TROPONIN I (HIGH SENSITIVITY)    EKG EKG Interpretation  Date/Time:  Thursday June 26 2019 11:17:10 EDT Ventricular Rate:  120 PR Interval:  114 QRS Duration: 126 QT Interval:  364 QTC Calculation: 514 R Axis:   -26 Text Interpretation: Sinus tachycardia Right bundle branch block Abnormal ECG Confirmed by Nat Christen [phone]) on 06/26/2019 12:02:00 PM Also confirmed by Nat Christen [phone])  on 06/26/2019 3:04:45 PM   Radiology CT Angio Chest PE W and/or Wo Contrast  Result  Date: 06/26/2019 CLINICAL DATA:  Short of breath. Chest heaviness. Elevated heart rated 115 EXAM: CT ANGIOGRAPHY CHEST WITH CONTRAST TECHNIQUE: Multidetector CT imaging of the chest was performed using the standard protocol during bolus administration of intravenous contrast. Multiplanar CT image reconstructions and MIPs were obtained to evaluate the vascular anatomy. CONTRAST:  3mL OMNIPAQUE IOHEXOL  350 MG/ML SOLN COMPARISON:  CT 08/31/2018 FINDINGS: Cardiovascular: No filling defects within the pulmonary arteries to suggest acute pulmonary embolism. No acute findings of the aorta or great vessels. No pericardial fluid. Graft repair of the ascending aorta.  Valve replacements. Mediastinum/Nodes: No axillary or supraclavicular lymphadenopathy. Prominent RIGHT axillary lymph nodes are upper limits of normal at 8 to 9 mm. New lymphadenectomy clips in the high RIGHT axilla. No supraclavicular adenopathy. No mediastinal adenopathy. No pericardial effusion. Esophagus. Lungs/Pleura: No pulmonary infarction. No pneumonia. Enlarged pulmonary cyst in LEFT lower lobe unchanged. Airways normal. Upper Abdomen: Limited view of the liver, kidneys, pancreas are unremarkable. Normal adrenal glands. Musculoskeletal: No aggressive osseous lesion. Review of the MIP images confirms the above findings. IMPRESSION: 1. No evidence acute pulmonary embolism. 2. No acute pulmonary parenchymal findings. 3. Increased prominence RIGHT axillary lymph nodes. New lymphadenectomy clips in the high RIGHT axilla may relate to this finding. Electronically Signed   By: Suzy Bouchard M.D.   On: 06/26/2019 14:17   DG Chest Port 1 View  Result Date: 06/26/2019 CLINICAL DATA:  Tachycardia. Previous aortic, tricuspid and mitral valve replacements. Previous replacement of the ascending thoracic aorta. Clipping of the left atrial appendage. EXAM: PORTABLE CHEST 1 VIEW COMPARISON:  Chest x-ray dated 10/21/2018 FINDINGS: The heart is normal in size  considering the AP portable technique. Valve prostheses are normal lead. Clip on the left atrial appendage. Pulmonary vascularity is normal and the lungs are clear. No effusions. No acute bone abnormality. IMPRESSION: No acute abnormality. Electronically Signed   By: Lorriane Shire M.D.   On: 06/26/2019 12:04    Procedures Procedures (including critical care time)  Medications Ordered in ED Medications  metoprolol tartrate (LOPRESSOR) injection 5 mg (has no administration in time range)  iohexol (OMNIPAQUE) 350 MG/ML injection 75 mL (75 mLs Intravenous Contrast Given 06/26/19 1344)    ED Course  I have reviewed the triage vital signs and the nursing notes.  Pertinent labs & imaging results that were available during my care of the patient were reviewed by me and considered in my medical decision making (see chart for details).    MDM Rules/Calculators/A&P                      Uncertain etiology of patient's tachycardia.  EKG reveals sinus tachycardia at rate 120.  Hemoglobin, metabolic profile, TSH all within normal limits.  CT angiogram of chest negative.  Will discuss with cardiology.  1500: Discussed with cardiologist.  Recommend Lopressor 5 mg IV in the ED.  Discharge medication Lopressor 25 mg twice a day.  Follow-up with cardiology.  Discussed with patient in detail. Final Clinical Impression(s) / ED Diagnoses Final diagnoses:  Tachycardia  Atrial flutter, unspecified type ([place])    Rx / DC Orders ED Discharge Orders         Ordered    metoprolol tartrate (LOPRESSOR) 25 MG tablet  2 times daily     06/26/19 1506           Nat Christen, MD 06/26/19 1438    Nat Christen, MD 06/26/19 [phone]

## 2019-06-26 NOTE — Telephone Encounter (Signed)
Per Pt, she is having a fast HR  Please call [phone]  Thanks renee

## 2019-06-26 NOTE — Progress Notes (Signed)
   Subjective:    Patient ID: Hannah Roy, female    DOB: 1941-04-03, 79 y.o.   MRN: [id number]  HPItachycardia. Started last night. Pt states heart rate in the 120's. Pt sees cardiology Dr. Raliegh Ip.  Patient states that she started off yesterday evening having a fast heart rate it was up into the 120s and she felt but this morning it was down around 100 she did call cardiology and also called our office we were able to work her in this morning she denies any chest pressure tightness pain or shortness of breath just states she feels her heart was running fast she relates compliance with her medicine denies fever sweats chills states appetite has been doing good denies any recent sickness.  Patient states that the heart rate seems to stay up no matter what she is doing and during the night it was running fast but she does not take more than 120   Review of Systems As above no chest tightness pressure pain shortness of breath or swelling in the legs no fever chills sweats    Objective:   Physical Exam Lungs are clear respiratory rate normal heart regular extremities no edema skin warm dry  EKG shows sinus tachycardia but also shows right bundle branch block and some early ST segment depressions which could be rate related    Assessment & Plan:  Onset of sinus tachycardia with a history of valvular heart disease on medications followed by cardiology I did speak with cardiology they will be watching for her in the ER Also spoke with the ER doctor I doubt endocarditis but more than likely patient will need lab work medications possibly echo We will also need troponin enzymes to rule out ischemic heart disease as well Patient was escorted to the ER upon her consent and request

## 2019-06-26 NOTE — Telephone Encounter (Signed)
Patient has had fast heart rate today, HR 115, BP 160/107 at 2 am. Will see pcp at 1030 and get EKG. I will FYI Dr.Koneswaran

## 2019-06-26 NOTE — Discharge Instructions (Addendum)
All of your tests were good with the exception of the tachycardia.  I discussed this with your cardiologist.  He thinks it is a situation called atrial flutter.  We will start a new medication.  If you do not hear from the office by tomorrow morning, call for a follow-up appointment.

## 2019-06-26 NOTE — ED Triage Notes (Signed)
Pt sent for Dr. Wolfgang Phoenix today for tachycardia.  Pt states having second covid vaccine Friday. Had bodyaches and chills sat/sun. Went walking yesterday and noticed her heart was racing.

## 2019-07-01 ENCOUNTER — Emergency Department (HOSPITAL_COMMUNITY): Payer: Medicare Other

## 2019-07-01 ENCOUNTER — Encounter (HOSPITAL_COMMUNITY): Payer: Self-pay | Admitting: Emergency Medicine

## 2019-07-01 ENCOUNTER — Other Ambulatory Visit: Payer: Self-pay

## 2019-07-01 ENCOUNTER — Emergency Department (HOSPITAL_COMMUNITY)
Admission: EM | Admit: 2019-07-01 | Discharge: 2019-07-01 | Disposition: A | Discharge status: Home / Self Care | Payer: Medicare Other | Attending: Emergency Medicine | Admitting: Emergency Medicine

## 2019-07-01 DIAGNOSIS — Z79899 Other long term (current) drug therapy: Secondary | ICD-10-CM | POA: Insufficient documentation

## 2019-07-01 DIAGNOSIS — E039 Hypothyroidism, unspecified: Secondary | ICD-10-CM | POA: Diagnosis not present

## 2019-07-01 DIAGNOSIS — Z87891 Personal history of nicotine dependence: Secondary | ICD-10-CM | POA: Insufficient documentation

## 2019-07-01 DIAGNOSIS — Z7901 Long term (current) use of anticoagulants: Secondary | ICD-10-CM | POA: Insufficient documentation

## 2019-07-01 DIAGNOSIS — R200 Anesthesia of skin: Secondary | ICD-10-CM | POA: Diagnosis not present

## 2019-07-01 DIAGNOSIS — Z952 Presence of prosthetic heart valve: Secondary | ICD-10-CM | POA: Diagnosis not present

## 2019-07-01 DIAGNOSIS — I10 Essential (primary) hypertension: Secondary | ICD-10-CM | POA: Diagnosis not present

## 2019-07-01 DIAGNOSIS — R002 Palpitations: Secondary | ICD-10-CM

## 2019-07-01 DIAGNOSIS — R079 Chest pain, unspecified: Secondary | ICD-10-CM | POA: Diagnosis not present

## 2019-07-01 LAB — TROPONIN I (HIGH SENSITIVITY): Troponin I (High Sensitivity): 10 ng/L (ref <18)

## 2019-07-01 LAB — CBC
HCT: 33.4 % — ABNORMAL LOW (ref 36.0–46.0)
Hemoglobin: 10.6 g/dL — ABNORMAL LOW (ref 12.0–15.0)
MCH: 30.1 pg (ref 26.0–34.0)
MCHC: 31.7 g/dL (ref 30.0–36.0)
MCV: 94.9 fL (ref 80.0–100.0)
Platelets: 210 10*3/uL (ref 150–400)
RBC: 3.52 MIL/uL — ABNORMAL LOW (ref 3.87–5.11)
RDW: 12.8 % (ref 11.5–15.5)
WBC: 5.4 10*3/uL (ref 4.0–10.5)
nRBC: 0 % (ref 0.0–0.2)

## 2019-07-01 LAB — BASIC METABOLIC PANEL
Anion gap: 9 (ref 5–15)
BUN: 23 mg/dL (ref 8–23)
CO2: 23 mmol/L (ref 22–32)
Calcium: 8.9 mg/dL (ref 8.9–10.3)
Chloride: 108 mmol/L (ref 98–111)
Creatinine, Ser: 0.69 mg/dL (ref 0.44–1.00)
GFR calc Af Amer: >60 mL/min (ref >60)
GFR calc non Af Amer: >60 mL/min (ref >60)
Glucose, Bld: 126 mg/dL — ABNORMAL HIGH (ref 70–99)
Potassium: 3.4 mmol/L — ABNORMAL LOW (ref 3.5–5.1)
Sodium: 140 mmol/L (ref 135–145)

## 2019-07-01 LAB — PROTIME-INR
INR: 2.5 — ABNORMAL HIGH (ref 0.8–1.2)
Prothrombin Time: 27.3 seconds — ABNORMAL HIGH (ref 11.4–15.2)

## 2019-07-01 LAB — TSH: TSH: 6.471 u[IU]/mL — ABNORMAL HIGH (ref 0.350–4.500)

## 2019-07-01 NOTE — Discharge Instructions (Addendum)
Continue taking your new medication twice a day.  Call your cardiologist tomorrow for a follow-up appointment sooner.  Can always return to the emergency department if feeling worse.

## 2019-07-01 NOTE — ED Triage Notes (Signed)
Pt c/o chest pain that began today. Patient states that her heart beat feels rapid and her right arm is tingling.

## 2019-07-02 ENCOUNTER — Telehealth: Payer: Self-pay | Admitting: Family Medicine

## 2019-07-02 ENCOUNTER — Ambulatory Visit (INDEPENDENT_AMBULATORY_CARE_PROVIDER_SITE_OTHER): Payer: Medicare Other | Admitting: *Deleted

## 2019-07-02 DIAGNOSIS — I4819 Other persistent atrial fibrillation: Secondary | ICD-10-CM | POA: Diagnosis not present

## 2019-07-02 DIAGNOSIS — I4891 Unspecified atrial fibrillation: Secondary | ICD-10-CM | POA: Diagnosis not present

## 2019-07-02 DIAGNOSIS — Z5181 Encounter for therapeutic drug level monitoring: Secondary | ICD-10-CM

## 2019-07-02 DIAGNOSIS — Z953 Presence of xenogenic heart valve: Secondary | ICD-10-CM

## 2019-07-02 LAB — POCT INR: INR: 3.1 — AB (ref 2.0–3.0)

## 2019-07-02 NOTE — Telephone Encounter (Signed)
Patient scheduled office visit with Dr Nicki Reaper 07/03/19 for ER follow up.

## 2019-07-02 NOTE — Telephone Encounter (Signed)
Please let patient know that I am aware that she had to go back to the ER  (last week I saw the patient We took her to the ER for further evaluation She went back to the ER yesterday She has a follow-up appointment with cardiology later in April) Touch base with the patient if she would like to do a follow-up visit with myself either via phone or in person we can do so tomorrow morning or Friday morning (It is her option.  Given the frequency of her issues she may want to do a follow-up with [place] this week so we can make sure things are going okay to lessen the chance of having to go back to the ER)

## 2019-07-02 NOTE — ED Provider Notes (Signed)
[facility] EMERGENCY DEPARTMENT Provider Note   CSN: [id number] Arrival date & time: 07/01/19  1921     History Chief Complaint  Patient presents with  . Chest Pain    Hannah Roy is a 79 y.o. female.  Chief complaint heart palpitations and chest pain earlier today with tingling in her right arm.  Right arm symptoms have improved.  Brief episode of feeling cold and diaphoretic.  No vomiting.  No facial asymmetry, slurred speech, confusion, extremity weakness.  Patient was in the emergency department approximately 1 week ago for similar symptoms.  She does have local cardiology follow-up.  She is taking additionally Lopressor 25 mg twice daily as recommended by cardiology last week.        Past Medical History:  Diagnosis Date  . Aortic atherosclerosis ([place])   . Aortic insufficiency, rheumatic   . 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 Atrial fibrillation ([place]) 05/10/2018  . Colon polyps   . Essential hypertension, benign   . History of seasonal allergies   . Hypertension   . Hypothyroidism   . Mitral stenosis with regurgitation, rheumatic   . Osteopenia 2006  . Persistent atrial fibrillation ([place])   . 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 S/P ascending aortic aneurysm repair 09/05/2018   28 mm Hemashield platinum supracoronary straight graft  . S/P Maze operation for atrial fibrillation 09/05/2018   Complete bilateral atrial lesion set using bipolar radiofrequency and cryothermy ablation with clipping of LA appendage  . 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   29 mm [facility] Mitral stented bovine pericardial tissue valve  . 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 Tricuspid regurgitation     Patient Active Problem List   Diagnosis Date Noted  . Duodenal ulcer 10/28/2018  . Pericardial effusion 10/28/2018    . UGI bleed 10/14/2018  . Paroxysmal A-fib ([place])   . Melena   . Symptomatic anemia 10/07/2018  . Encounter for therapeutic drug monitoring 09/24/2018  . Dysphagia 09/23/2018  . Hoarseness of voice 09/23/2018  . 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  . 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 S/P Maze operation for atrial fibrillation 09/05/2018  . S/P ascending aortic aneurysm repair 09/05/2018  . Orthostatic hypotension 09/01/2018  . Aortic insufficiency, rheumatic   . Mitral stenosis with regurgitation, rheumatic   . Tricuspid regurgitation   . 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 Orthostasis 08/31/2018  . Acute on chronic diastolic heart failure ([place]) 08/31/2018  . Rheumatic heart disease   . Persistent atrial fibrillation ([place])   . Atrial fibrillation with RVR ([place]) 05/10/2018  . Hypothyroidism   . Aortic atherosclerosis ([place]) 05/07/2018  . History of colonic polyps 08/03/2016  . Osteoarthritis of right knee 10/17/2013  . Allergic rhinitis 07/30/2012  . 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 Osteopenia 07/30/2012  . Essential hypertension, benign     Past Surgical History:  Procedure Laterality Date  . AORTIC VALVE REPLACEMENT N/A 09/05/2018   Procedure: AORTIC VALVE REPLACEMENT (AVR) USING MAGNA EASE 21MM AOTIC BIOPROSTHESIS VALVE;  Surgeon: Rexene Alberts, MD;  Location: [place];  Service: Open Heart Surgery;  Laterality: N/A;  . APPENDECTOMY    . CLIPPING OF ATRIAL APPENDAGE  09/05/2018   Procedure: Clipping Of Atrial Appendage;  Surgeon: Rexene Alberts, MD;  Location: [facility] OR;  Service: Open Heart Surgery;;  . COLONOSCOPY  03/23/2011   repeat in 5 years,Procedure: COLONOSCOPY;  Surgeon: Rogene Houston, MD;  [facility];  Service: Endoscopy;  Laterality: N/A;  1:00  . COLONOSCOPY N/A 11/09/2016   Procedure: COLONOSCOPY;  Surgeon: Rogene Houston, MD;  [facility];  Service: Endoscopy;  Laterality: N/A;  1030  . ESOPHAGOGASTRODUODENOSCOPY N/A 10/10/2018   Procedure: ESOPHAGOGASTRODUODENOSCOPY (EGD);  Surgeon: Doran Stabler, MD;  Location: [place];  Service: Gastroenterology;  Laterality: N/A;  . ESOPHAGOGASTRODUODENOSCOPY (EGD) WITH PROPOFOL N/A 10/08/2018   Procedure: ESOPHAGOGASTRODUODENOSCOPY (EGD) WITH PROPOFOL;  Surgeon: Danie Binder, MD;  [facility];  Service: Endoscopy;  Laterality: N/A;  . ESOPHAGOGASTRODUODENOSCOPY (EGD) WITH PROPOFOL N/A 11/15/2018   Procedure: ESOPHAGOGASTRODUODENOSCOPY (EGD) WITH PROPOFOL;  Surgeon: Rogene Houston, MD;  [facility];  Service: Endoscopy;  Laterality: N/A;  12:55  . HOT HEMOSTASIS N/A 10/10/2018   Procedure: HOT HEMOSTASIS (ARGON PLASMA COAGULATION/BICAP);  Surgeon: Doran Stabler, MD;  Location: [place];  Service: Gastroenterology;  Laterality: N/A;  . IR ANGIOGRAM SELECTIVE EACH ADDITIONAL VESSEL  10/08/2018  . IR ANGIOGRAM VISCERAL SELECTIVE  10/08/2018  . IR EMBO ART  VEN HEMORR LYMPH EXTRAV  INC GUIDE ROADMAPPING  10/08/2018  . IR US GUIDE VASC ACCESS RIGHT  10/08/2018  . MAZE N/A 09/05/2018   Procedure: MAZE;  Surgeon: Rexene Alberts, MD;  Location: [place];  Service: Open Heart Surgery;  Laterality: N/A;  . MITRAL VALVE REPLACEMENT N/A 09/05/2018   Procedure: MITRAL VALVE (MV) REPLACEMENT USING MAGNA MITRAL EASE 29MM BIOPROSTHESIS VALVE;  Surgeon: Rexene Alberts, MD;  Location: [place];  Service: Open Heart Surgery;  Laterality: N/A;  . REPLACEMENT ASCENDING AORTA N/A 09/05/2018   Procedure: SUPRACORONARY STRAIGHT GRAFT REPLACEMENT OF ASCENDING AORTA;  Surgeon: Rexene Alberts, MD;  Location: [place];  Service: Open Heart Surgery;  Laterality: N/A;  . RIGHT/LEFT HEART CATH AND CORONARY ANGIOGRAPHY N/A 08/29/2018   Procedure: RIGHT/LEFT HEART CATH AND CORONARY ANGIOGRAPHY;  Surgeon: Lorretta Harp, MD;  Location: [place] [facility];  Service: Cardiovascular;   Laterality: N/A;  . TEE WITHOUT CARDIOVERSION N/A 08/29/2018   Procedure: TRANSESOPHAGEAL ECHOCARDIOGRAM (TEE);  Surgeon: Skeet Latch, MD;  Location: [place];  Service: Cardiovascular;  Laterality: N/A;  . TRICUSPID VALVE REPLACEMENT N/A 09/05/2018   Procedure: TRICUSPID VALVE REPAIR USING EDWARDS MC3 TRICUSPID ANNULOPLASTY RING SIZE T28;  Surgeon: Rexene Alberts, MD;  Location: [place];  Service: Open Heart Surgery;  Laterality: N/A;  . TUBAL LIGATION       OB History   No obstetric history on file.     Family History  Problem Relation Age of Onset  . Colon cancer Daughter        about 23    Social History   Tobacco Use  . Smoking status: Former Smoker    Packs/day: 0.50    Years: 10.00    Pack years: 5.00    Types: Cigarettes  . Smokeless tobacco: Never Used  Substance Use Topics  . Alcohol use: No  . Drug use: No    Home Medications Prior to Admission medications   Medication Sig Start Date End Date Taking? Authorizing Provider  acetaminophen (TYLENOL) 325 MG tablet Take 650 mg by mouth every 6 (six) hours as needed for fever.    [provider]  amLODipine (NORVASC) 2.5 MG tablet  1/2 q evening 06/17/19   Kathyrn Drown, MD  losartan (COZAAR) 50 MG tablet Take 50 mg am 06/17/19   Kathyrn Drown, MD  metoprolol tartrate (LOPRESSOR) 25 MG tablet Take 1 tablet (25 mg total) by mouth 2 (two) times daily. 06/26/19   Nat Christen, MD  pantoprazole (PROTONIX) 40 MG tablet Take 1 tablet (40 mg total) by mouth daily before breakfast. 11/15/18 06/26/19  Rehman, Mechele Dawley, MD  rosuvastatin (CRESTOR) 5 MG tablet TAKE 1 TABLET BY MOUTH ON MONDAY, [facility] AND FRIDAY 06/17/19   Kathyrn Drown, MD  valACYclovir (VALTREX) 1000 MG tablet 2 po twice a day for one day as needed for fever blisters Patient not taking: Reported on 06/26/2019 06/17/19   Kathyrn Drown, MD  warfarin (COUMADIN) 2.5 MG tablet Take 2 tablets daily except 1 1/2 tablets on Sundays, Tuesdays and Thursdays  04/28/19   Herminio Commons, MD    Allergies    Bee venom, Boniva [ibandronic acid], Lisinopril, and Latex  Review of Systems   Review of Systems  All other systems reviewed and are negative.   Physical Exam Updated Vital Signs BP (!) 142/90   Pulse 77   Temp 98.4 F (36.9 C) (Oral)   Resp 12   Ht 5\' 1"  (1.549 m)   Wt 54.4 kg   SpO2 95%   BMI 22.67 kg/m   Physical Exam  ED Results / Procedures / Treatments   Labs (all labs ordered are listed, but only abnormal results are displayed) Labs Reviewed  BASIC METABOLIC PANEL - Abnormal; Notable for the following components:      Result Value   Potassium 3.4 (*)    Glucose, Bld 126 (*)    All other components within normal limits  CBC - Abnormal; Notable for the following components:   RBC 3.52 (*)    Hemoglobin 10.6 (*)    HCT 33.4 (*)    All other components within normal limits  PROTIME-INR - Abnormal; Notable for the following components:   Prothrombin Time 27.3 (*)    INR 2.5 (*)    All other components within normal limits  TSH - Abnormal; Notable for the following components:   TSH 6.471 (*)    All other components within normal limits  TROPONIN I (HIGH SENSITIVITY)    EKG EKG Interpretation  Date/Time:  Tuesday July 01 2019 19:51:10 EDT Ventricular Rate:  93 PR Interval:    QRS Duration: 148 QT Interval:  400 QTC Calculation: 497 R Axis:   -27 Text Interpretation: Undetermined rhythm Right bundle branch block Abnormal ECG Confirmed by Sherwood Gambler [phone]) on 07/02/2019 7:35:20 AM EKG reviewed: Looks like normal sinus rhythm with intermittent short pauses.  Radiology DG Chest 2 View  Result Date: 07/01/2019 CLINICAL DATA:  Chest pain EXAM: CHEST - 2 VIEW COMPARISON:  06/26/2019 FINDINGS: Prior median sternotomy and valve replacement. Mild cardiomegaly. Hyperinflation of the lungs compatible with COPD. No confluent airspace opacities or effusions. No acute bony abnormality. IMPRESSION:  Cardiomegaly.  Hyperinflation.  No active disease. Electronically Signed   By: Rolm Baptise M.D.   On: 07/01/2019 20:35   CT Head Wo Contrast  Result Date: 07/01/2019 CLINICAL DATA:  Right arm numbness EXAM: CT HEAD WITHOUT CONTRAST TECHNIQUE: Contiguous axial images were obtained from the base of the skull through the vertex without intravenous contrast. COMPARISON:  08/31/2018 FINDINGS: Brain: No acute intracranial abnormality. Specifically, no hemorrhage, hydrocephalus, mass lesion, acute infarction, or significant intracranial injury. Vascular: No hyperdense  vessel or unexpected calcification. Skull: No acute calvarial abnormality. Sinuses/Orbits: No acute findings Other: None IMPRESSION: No acute intracranial abnormality. Electronically Signed   By: Rolm Baptise M.D.   On: 07/01/2019 21:40    Procedures Procedures (including critical care time)  Medications Ordered in ED Medications - No data to display  ED Course  I have reviewed the triage vital signs and the nursing notes.  Pertinent labs & imaging results that were available during my care of the patient were reviewed by me and considered in my medical decision making (see chart for details).    MDM Rules/Calculators/A&P                     Patient is hemodynamically stable.  EKG is challenging to interpret, but no obvious acute infarct, ST elevation, ST depression.  Troponin negative.  TSH slightly elevated.  She is currently taking Lopressor 25 mg twice a day as recommended last week by cardiology.  We discussed the possibility of an observation admission.  Shared decision-making was performed.  Patient decided to go home and follow-up with either primary care or cardiology this week.  She understands to return if any symptoms worsen.  Final Clinical Impression(s) / ED Diagnoses Final diagnoses:  Palpitations    Rx / DC Orders ED Discharge Orders    None       Nat Christen, MD 07/02/19 2255

## 2019-07-02 NOTE — Patient Instructions (Signed)
Continue warfarin 2 tablets daily except 1 1/2 tablets on Sundays, Tuesdays and Thursdays Recheck in 4 wks

## 2019-07-03 ENCOUNTER — Other Ambulatory Visit: Payer: Self-pay

## 2019-07-03 ENCOUNTER — Telehealth: Payer: Self-pay | Admitting: *Deleted

## 2019-07-03 ENCOUNTER — Encounter: Payer: Self-pay | Admitting: Family Medicine

## 2019-07-03 ENCOUNTER — Ambulatory Visit (INDEPENDENT_AMBULATORY_CARE_PROVIDER_SITE_OTHER): Payer: Medicare Other | Admitting: Family Medicine

## 2019-07-03 VITALS — BP 110/68 | Temp 98.3°F | Ht 61.0 in | Wt 120.0 lb

## 2019-07-03 DIAGNOSIS — E876 Hypokalemia: Secondary | ICD-10-CM

## 2019-07-03 DIAGNOSIS — I499 Cardiac arrhythmia, unspecified: Secondary | ICD-10-CM

## 2019-07-03 DIAGNOSIS — D649 Anemia, unspecified: Secondary | ICD-10-CM | POA: Diagnosis not present

## 2019-07-03 DIAGNOSIS — Z13220 Encounter for screening for lipoid disorders: Secondary | ICD-10-CM | POA: Diagnosis not present

## 2019-07-03 DIAGNOSIS — I10 Essential (primary) hypertension: Secondary | ICD-10-CM | POA: Diagnosis not present

## 2019-07-03 DIAGNOSIS — R000 Tachycardia, unspecified: Secondary | ICD-10-CM | POA: Diagnosis not present

## 2019-07-03 DIAGNOSIS — R7989 Other specified abnormal findings of blood chemistry: Secondary | ICD-10-CM | POA: Diagnosis not present

## 2019-07-03 NOTE — Telephone Encounter (Signed)
Pt wants to ask dr Nicki Reaper if he thinks it will be ok if she takes an overnight trip with her husband next week. Spending one night and coming back the next day but not sure which day that is yet. Car ride is 4 hours long

## 2019-07-03 NOTE — Telephone Encounter (Signed)
Patient notified and is flexible for cardiology appointment to be moved to next week.

## 2019-07-03 NOTE — Telephone Encounter (Signed)
The difficult part with this is that if they can see her sooner she needs to be flexible with what they give her  So in other words they may want to see her next week during this trip I have no control on this Please also go ahead with notifying cardiology office we would like for the patient to be seen sooner next week because of atrial fibrillation and 2 ER visits

## 2019-07-03 NOTE — Telephone Encounter (Signed)
Echo put in for pt today. Pt can go anytime

## 2019-07-03 NOTE — Progress Notes (Signed)
Subjective:    Patient ID: Hannah Roy, female    DOB: 09-May-1940, 79 y.o.   MRN: [id number]  HPIED follow up. Went on 3/18 and 3/23.  Pt states she still not feeling well. Had some sob when walking in today. Sees cardiology on April 9th.   Very nice patient has been in the ER twice with tachycardia shortness of breath and feeling unusual sensations in her chest she denies substernal angina symptoms denies passing out.  Patient was placed on beta-blockers was taking them appropriately and had another flareup just the other day on the 23rd and had to go to the ER.  Here today for follow-up.  She denies swelling in the legs denies any substernal pressure tightness pain gets a little short winded if she pushes herself patient finds her self feeling anxious about everything that is going on and has had previous heart surgery  Review of Systems  Constitutional: Negative for activity change, appetite change and fatigue.  HENT: Negative for congestion and rhinorrhea.   Respiratory: Positive for shortness of breath. Negative for cough.   Cardiovascular: Positive for palpitations. Negative for chest pain and leg swelling.  Gastrointestinal: Negative for abdominal pain and diarrhea.  Endocrine: Negative for polydipsia and polyphagia.  Skin: Negative for color change.  Neurological: Negative for dizziness and weakness.  Psychiatric/Behavioral: Negative for behavioral problems and confusion.       Objective:   Physical Exam Vitals reviewed.  Constitutional:      General: She is not in acute distress. HENT:     Head: Normocephalic and atraumatic.  Eyes:     General:        Right eye: No discharge.        Left eye: No discharge.  Neck:     Trachea: No tracheal deviation.  Cardiovascular:     Rate and Rhythm: Normal rate. Rhythm irregular.     Heart sounds: Normal heart sounds. No murmur.  Pulmonary:     Effort: Pulmonary effort is normal. No respiratory distress.     Breath sounds: Normal  breath sounds.  Lymphadenopathy:     Cervical: No cervical adenopathy.  Skin:    General: Skin is warm and dry.  Neurological:     Mental Status: She is alert.     Coordination: Coordination normal.  Psychiatric:        Behavior: Behavior normal.    EKG shows irregularity difficult to link the P waves with the QRS therefore there is some concern for atrial fib with possibility of her having some rapid ventricular responses       Assessment & Plan:  I would recommend she keep on the beta-blocker as is currently Given her EKG findings try to get her in with cardiology sooner.  Perhaps they can do a telemetry on her between now and when they see her We will go ahead and do an echo on her because of history of valvular disease as well as intermittent atrial fib she is already on Coumadin.  Patient fairly anxious about what is going on  1. Irregular heart beat We will try to get her in with cardiology see discussion above - ECHOCARDIOGRAM COMPLETE - PR ELECTROCARDIOGRAM, COMPLETE - TSH - CBC with Differential/Platelet - T4, free - Lipid panel - Basic metabolic panel  2. Tachycardia, unspecified Continue the beta-blocker - ECHOCARDIOGRAM COMPLETE - PR ELECTROCARDIOGRAM, COMPLETE - TSH - CBC with Differential/Platelet - T4, free - Lipid panel - Basic metabolic panel  3. Hypokalemia  Recheck metabolic 7 - TSH - CBC with Differential/Platelet - Lipid panel - Basic metabolic panel  4. Anemia, unspecified type Slight anemia noted check CBC and ferritin - CBC with Differential/Platelet - Ferritin  5. Elevated TSH TSH elevated on recent lab work repeat TSH with a free T4 - TSH - T4, free  6. Screening for lipid disorders Patient due for lipid profile - Lipid panel  7. Essential hypertension, benign Blood pressure good control I find no evidence of angina I do not recommend catheterization at this point - Lipid panel

## 2019-07-03 NOTE — Telephone Encounter (Signed)
No PA req'd, scheduled & pt notified by someone in [place] [facility]

## 2019-07-04 ENCOUNTER — Telehealth: Payer: Self-pay | Admitting: Family Medicine

## 2019-07-04 LAB — CBC WITH DIFFERENTIAL/PLATELET
Basophils Absolute: 0 10*3/uL (ref 0.0–0.2)
Basos: 1 %
EOS (ABSOLUTE): 0.1 10*3/uL (ref 0.0–0.4)
Eos: 2 %
Hematocrit: 36.2 % (ref 34.0–46.6)
Hemoglobin: 12 g/dL (ref 11.1–15.9)
Immature Grans (Abs): 0 10*3/uL (ref 0.0–0.1)
Immature Granulocytes: 0 %
Lymphocytes Absolute: 1.5 10*3/uL (ref 0.7–3.1)
Lymphs: 26 %
MCH: 30.5 pg (ref 26.6–33.0)
MCHC: 33.1 g/dL (ref 31.5–35.7)
MCV: 92 fL (ref 79–97)
Monocytes Absolute: 0.4 10*3/uL (ref 0.1–0.9)
Monocytes: 7 %
Neutrophils Absolute: 3.7 10*3/uL (ref 1.4–7.0)
Neutrophils: 64 %
Platelets: 242 10*3/uL (ref 150–450)
RBC: 3.93 x10E6/uL (ref 3.77–5.28)
RDW: 12.9 % (ref 11.7–15.4)
WBC: 5.8 10*3/uL (ref 3.4–10.8)

## 2019-07-04 LAB — BASIC METABOLIC PANEL
BUN/Creatinine Ratio: 18 (ref 12–28)
BUN: 15 mg/dL (ref 8–27)
CO2: 23 mmol/L (ref 20–29)
Calcium: 9.5 mg/dL (ref 8.7–10.3)
Chloride: 104 mmol/L (ref 96–106)
Creatinine, Ser: 0.84 mg/dL (ref 0.57–1.00)
GFR calc Af Amer: 78 mL/min/{1.73_m2} (ref >59)
GFR calc non Af Amer: 68 mL/min/{1.73_m2} (ref >59)
Glucose: 88 mg/dL (ref 65–99)
Potassium: 3.7 mmol/L (ref 3.5–5.2)
Sodium: 141 mmol/L (ref 134–144)

## 2019-07-04 LAB — TSH: TSH: 3.45 u[IU]/mL (ref 0.450–4.500)

## 2019-07-04 LAB — LIPID PANEL
Chol/HDL Ratio: 2.9 ratio (ref 0.0–4.4)
Cholesterol, Total: 152 mg/dL (ref 100–199)
HDL: 53 mg/dL (ref >39)
LDL Chol Calc (NIH): 80 mg/dL (ref 0–99)
Triglycerides: 102 mg/dL (ref 0–149)
VLDL Cholesterol Cal: 19 mg/dL (ref 5–40)

## 2019-07-04 LAB — T4, FREE: Free T4: 1.32 ng/dL (ref 0.82–1.77)

## 2019-07-04 LAB — FERRITIN: Ferritin: 183 ng/mL — ABNORMAL HIGH (ref 15–150)

## 2019-07-04 NOTE — Telephone Encounter (Signed)
See result note  Results discussed with patient. Patient advised per Dr Nicki Reaper: that her lab work looks improved.  Thyroid function good.  Not anemic.  Kidney functions look good.  Cholesterol looks good. Iron storage is slightly elevated so therefore no need to take iron tablets of any sort. Patient verbalized understanding and is aware of appointment with Cardiology and her Echo on Tuesday 07/08/19.

## 2019-07-04 NOTE — Telephone Encounter (Signed)
Please let the patient be aware of her labs-see result note

## 2019-07-04 NOTE — Telephone Encounter (Signed)
Pt is scheduled for 07/08/2019 with Dr. Raliegh Ip in [place] - called & notified pt

## 2019-07-08 ENCOUNTER — Other Ambulatory Visit: Payer: Self-pay

## 2019-07-08 ENCOUNTER — Encounter: Payer: Self-pay | Admitting: Cardiovascular Disease

## 2019-07-08 ENCOUNTER — Ambulatory Visit (INDEPENDENT_AMBULATORY_CARE_PROVIDER_SITE_OTHER): Payer: Medicare Other | Admitting: Cardiovascular Disease

## 2019-07-08 ENCOUNTER — Ambulatory Visit (HOSPITAL_COMMUNITY)
Admission: RE | Admit: 2019-07-08 | Discharge: 2019-07-08 | Disposition: A | Discharge status: Home / Self Care | Payer: Medicare Other | Source: Ambulatory Visit | Attending: Family Medicine | Admitting: Family Medicine

## 2019-07-08 VITALS — BP 122/70 | HR 115 | Temp 97.7°F | Ht 61.0 in | Wt 118.0 lb

## 2019-07-08 DIAGNOSIS — Z953 Presence of xenogenic heart valve: Secondary | ICD-10-CM

## 2019-07-08 DIAGNOSIS — I499 Cardiac arrhythmia, unspecified: Secondary | ICD-10-CM | POA: Insufficient documentation

## 2019-07-08 DIAGNOSIS — I10 Essential (primary) hypertension: Secondary | ICD-10-CM | POA: Diagnosis not present

## 2019-07-08 DIAGNOSIS — I4819 Other persistent atrial fibrillation: Secondary | ICD-10-CM

## 2019-07-08 DIAGNOSIS — I38 Endocarditis, valve unspecified: Secondary | ICD-10-CM | POA: Diagnosis not present

## 2019-07-08 DIAGNOSIS — I071 Rheumatic tricuspid insufficiency: Secondary | ICD-10-CM | POA: Diagnosis not present

## 2019-07-08 DIAGNOSIS — I099 Rheumatic heart disease, unspecified: Secondary | ICD-10-CM | POA: Diagnosis not present

## 2019-07-08 DIAGNOSIS — I4892 Unspecified atrial flutter: Secondary | ICD-10-CM | POA: Diagnosis not present

## 2019-07-08 DIAGNOSIS — I119 Hypertensive heart disease without heart failure: Secondary | ICD-10-CM | POA: Diagnosis not present

## 2019-07-08 DIAGNOSIS — I082 Rheumatic disorders of both aortic and tricuspid valves: Secondary | ICD-10-CM | POA: Diagnosis not present

## 2019-07-08 DIAGNOSIS — I480 Paroxysmal atrial fibrillation: Secondary | ICD-10-CM | POA: Insufficient documentation

## 2019-07-08 DIAGNOSIS — R000 Tachycardia, unspecified: Secondary | ICD-10-CM | POA: Diagnosis not present

## 2019-07-08 MED ORDER — LOSARTAN POTASSIUM 25 MG PO TABS: 25.0000 mg | ORAL_TABLET | Freq: Every day | ORAL | 3 refills | Status: DC

## 2019-07-08 MED ORDER — METOPROLOL TARTRATE 50 MG PO TABS: 50.0000 mg | ORAL_TABLET | Freq: Two times a day (BID) | ORAL | 3 refills | Status: DC

## 2019-07-08 NOTE — Patient Instructions (Signed)
Medication Instructions:   STOP Amlodipine   INCREASE Metoprolol to 50 mg twice a day   DECREASE Losartan to 25 mg daily     Lab Work: None today If you have labs (blood work) drawn today and your tests are completely normal, you will receive your results only by: [name] MyChart Message (if you have MyChart) OR . A paper copy in the mail If you have any lab test that is abnormal or we need to change your treatment, we will call you to review the results.   Testing/Procedures: None today   Follow-Up: At [facility], you and your health needs are our priority.  As part of our continuing mission to provide you with exceptional heart care, we have created designated Provider Care Teams.  These Care Teams include your primary Cardiologist (physician) and Advanced Practice Providers (APPs -  Physician Assistants and Nurse Practitioners) who all work together to provide you with the care you need, when you need it.  We recommend signing up for the patient portal called "MyChart".  Sign up information is provided on this After Visit Summary.  MyChart is used to connect with patients for Virtual Visits (Telemedicine).  Patients are able to view lab/test results, encounter notes, upcoming appointments, etc.  Non-urgent messages can be sent to your provider as well.   To learn more about what you can do with MyChart, go to [email or web address].    Your next appointment:  Keep phone appointment on 6/22 at 8:20 am     Other Instructions  Call [place], RN next week and tell me how you feel. Also keep record of heart rate and blood pressure.

## 2019-07-08 NOTE — Progress Notes (Signed)
SUBJECTIVE: The patient presents for evaluation after 2 recent ED evaluations, 06/26/2019 and 07/01/2019. I have personally reviewed all documentation, labs, radiographic and cardiovascular studies, and independently interpreted all ECG's.  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  She was hospitalized for anemia secondary to an acute GI bleed in July 2020.  On March 18 she presented to the ED with complaints of palpitations and tachycardia.  She was given IV metoprolol.  CBC, comprehensive metabolic panel, TSH, and troponins were normal.  D-dimer was elevated at 1.64.  CT angiogram of the chest on 06/26/2019 showed no evidence of acute pulmonary embolism.  There were no acute pulmonary parenchymal findings.  There was increased prominence of right axillary lymph nodes.  I personally reviewed the ECG which demonstrated rapid atrial flutter with 2:1 conduction, 120 bpm.  I spoke with the ED physician and recommended she start Lopressor 25 mg twice daily.  She then presented to the ED with chest pain on 07/01/2019.  Chest x-ray showed cardiomegaly and hyperinflation with no active disease.  She also had some right arm numbness and underwent a head CT on 07/01/2019 which showed no acute intracranial abnormalities.  Potassium was slightly low at 3.4.  Hemoglobin was mildly low at 10.6.  Troponins were normal.  TSH was elevated at 6.47.    Free T4 was subsequently checked by PCP and found to be normal at 1.32.  Subsequent basic  metabolic panel on [id number] showed normalization of potassium, 3.7.  An echocardiogram was performed today which is reviewed in detail below and demonstrates normal LV systolic function with moderate tricuspid and mild aortic regurgitation.  ECG performed today which I personally reviewed demonstrates rapid atrial flutter with 2:1 conduction and underlying right bundle branch block, heart rate 112 bpm.  She has not had any significant chest pain, shortness of breath, or palpitations since starting metoprolol.  She has been avoiding any of her usual activities such as gardening and walking.  She plans to take a trip to [place] with her husband within the next couple of weeks.   [place] She has a strong faith.She [place] the [place] the age of 60. 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 He daughter has renal cancer.   Review of Systems: As per "subjective", otherwise negative.  Allergies  Allergen Reactions  . Bee Venom Swelling    Welps  . Boniva [Ibandronic Acid] Nausea And Vomiting and Other (See Comments)    Upset stomach, flu like symptoms   . Lisinopril Cough  . Latex Rash    Current Outpatient Medications  Medication Sig Dispense Refill  . acetaminophen (TYLENOL) 325 MG tablet Take 650 mg by mouth every 6 (six) hours as needed for fever.    [name] amLODipine (NORVASC) 2.5 MG tablet 1/2 q evening 45 tablet 1  . losartan (COZAAR) 50 MG tablet Take 50 mg am 90 tablet 1  . metoprolol tartrate (LOPRESSOR) 25  MG tablet Take 1 tablet (25 mg total) by mouth 2 (two) times daily. 60 tablet 0  . pantoprazole (PROTONIX) 40 MG tablet Take 1 tablet (40 mg total) by mouth daily before breakfast. 30 tablet 11  . rosuvastatin (CRESTOR) 5 MG tablet TAKE 1 TABLET BY MOUTH ON MONDAY, WEDNESDAY AND FRIDAY 36  tablet 1  . valACYclovir (VALTREX) 1000 MG tablet 2 po twice a day for one day as needed for fever blisters 28 tablet 6  . warfarin (COUMADIN) 2.5 MG tablet Take 2 tablets daily except 1 1/2 tablets on Sundays, Tuesdays and Thursdays 180 tablet 3   No current facility-administered medications for this visit.    Past Medical History:  Diagnosis Date  . Aortic atherosclerosis ([place])   . Aortic insufficiency, rheumatic   . 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 Atrial fibrillation ([place]) 05/10/2018  . Colon polyps   . Essential hypertension, benign   . History of seasonal allergies   . Hypertension   . Hypothyroidism   . Mitral stenosis with regurgitation, rheumatic   . Osteopenia 2006  . Persistent atrial fibrillation ([place])   . 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   21 mm [facility] Ease stented bovine pericardial tissue valve  . S/P ascending aortic aneurysm repair 09/05/2018   28 mm Hemashield platinum supracoronary straight graft  . S/P Maze operation for atrial fibrillation 09/05/2018   Complete bilateral atrial lesion set using bipolar radiofrequency and cryothermy ablation with clipping of LA appendage  . 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   29 mm [facility] Mitral stented bovine pericardial tissue valve  . 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 Tricuspid regurgitation     Past Surgical History:  Procedure Laterality Date  . AORTIC VALVE REPLACEMENT N/A 09/05/2018   Procedure: AORTIC VALVE REPLACEMENT (AVR) USING MAGNA EASE 21MM AOTIC BIOPROSTHESIS VALVE;  Surgeon: Rexene Alberts, MD;  Location: [facility];  Service: Open Heart Surgery;  Laterality: N/A;  . APPENDECTOMY    . CLIPPING OF ATRIAL APPENDAGE  09/05/2018   Procedure: Clipping Of Atrial Appendage;  Surgeon: Rexene Alberts, MD;  Location: [facility];  Service: Open Heart Surgery;;  . COLONOSCOPY  03/23/2011   repeat in 5  years,Procedure: COLONOSCOPY;  Surgeon: Rogene Houston, MD;  [facility];  Service: Endoscopy;  Laterality: N/A;  1:00  . COLONOSCOPY N/A 11/09/2016   Procedure: COLONOSCOPY;  Surgeon: Rogene Houston, MD;  [facility];  Service: Endoscopy;  Laterality: N/A;  1030  . ESOPHAGOGASTRODUODENOSCOPY N/A 10/10/2018   Procedure: ESOPHAGOGASTRODUODENOSCOPY (EGD);  Surgeon: Doran Stabler, MD;  Location: [facility];  Service: Gastroenterology;  Laterality: N/A;  . ESOPHAGOGASTRODUODENOSCOPY (EGD) WITH PROPOFOL N/A 10/08/2018   Procedure: ESOPHAGOGASTRODUODENOSCOPY (EGD) WITH PROPOFOL;  Surgeon: Danie Binder, MD;  [facility];  Service: Endoscopy;  Laterality: N/A;  . ESOPHAGOGASTRODUODENOSCOPY (EGD) WITH PROPOFOL N/A 11/15/2018   Procedure: ESOPHAGOGASTRODUODENOSCOPY (EGD) WITH PROPOFOL;  Surgeon: Rogene Houston, MD;  [facility];  Service: Endoscopy;  Laterality: N/A;  12:55  . HOT HEMOSTASIS N/A 10/10/2018   Procedure: HOT HEMOSTASIS (ARGON PLASMA COAGULATION/BICAP);  Surgeon: Doran Stabler, MD;  Location: [facility];  Service: Gastroenterology;  Laterality: N/A;  . IR ANGIOGRAM SELECTIVE EACH ADDITIONAL VESSEL  10/08/2018  . IR ANGIOGRAM VISCERAL SELECTIVE  10/08/2018  . IR EMBO ART  VEN HEMORR LYMPH EXTRAV  INC GUIDE  ROADMAPPING  10/08/2018  . IR US GUIDE VASC ACCESS RIGHT  10/08/2018  . MAZE N/A 09/05/2018   Procedure: MAZE;  Surgeon: Rexene Alberts, MD;  Location: [facility];  Service: Open Heart Surgery;  Laterality: N/A;  . MITRAL VALVE REPLACEMENT N/A 09/05/2018   Procedure: MITRAL VALVE (MV) REPLACEMENT USING MAGNA MITRAL EASE 29MM BIOPROSTHESIS VALVE;  Surgeon: Rexene Alberts, MD;  Location: [facility];  Service: Open Heart Surgery;  Laterality: N/A;  . REPLACEMENT ASCENDING AORTA N/A 09/05/2018   Procedure: SUPRACORONARY STRAIGHT GRAFT REPLACEMENT OF ASCENDING AORTA;  Surgeon: Rexene Alberts, MD;  Location: [facility];  Service: Open Heart Surgery;   Laterality: N/A;  . RIGHT/LEFT HEART CATH AND CORONARY ANGIOGRAPHY N/A 08/29/2018   Procedure: RIGHT/LEFT HEART CATH AND CORONARY ANGIOGRAPHY;  Surgeon: Lorretta Harp, MD;  Location: [facility];  Service: Cardiovascular;  Laterality: N/A;  . TEE WITHOUT CARDIOVERSION N/A 08/29/2018   Procedure: TRANSESOPHAGEAL ECHOCARDIOGRAM (TEE);  Surgeon: Skeet Latch, MD;  Location: [facility];  Service: Cardiovascular;  Laterality: N/A;  . TRICUSPID VALVE REPLACEMENT N/A 09/05/2018   Procedure: TRICUSPID VALVE REPAIR USING EDWARDS MC3 TRICUSPID ANNULOPLASTY RING SIZE T28;  Surgeon: Rexene Alberts, MD;  Location: [facility];  Service: Open Heart Surgery;  Laterality: N/A;  . TUBAL LIGATION      Social History   Socioeconomic History  . Marital status: Married    Spouse name: Not on file  . Number of children: Not on file  . Years of education: Not on file  . Highest education level: Not on file  Occupational History  . Not on file  Tobacco Use  . Smoking status: Former Smoker    Packs/day: 0.50    Years: 10.00    Pack years: 5.00    Types: Cigarettes  . Smokeless tobacco: Never Used  Substance and Sexual Activity  . Alcohol use: No  . Drug use: No  . Sexual activity: Yes  Other Topics Concern  . Not on file  Social History Narrative  . Not on file   Social Determinants of Health   Financial Resource Strain: Low Risk   . Difficulty of Paying Living Expenses: Not very hard  Food Insecurity: No Food Insecurity  . Worried About Charity fundraiser in the Last Year: Never true  . Ran Out of Food in the Last Year: Never true  Transportation Needs: No Transportation Needs  . Lack of Transportation (Medical): No  . Lack of Transportation (Non-Medical): No  Physical Activity: Insufficiently Active  . Days of Exercise per Week: 3 days  . Minutes of Exercise per Session: 20 min  Stress: No Stress Concern Present  . Feeling of Stress : Only a little  Social Connections:  Slightly Isolated  . Frequency of Communication with Friends and Family: More than three times a week  . Frequency of Social Gatherings with Friends and Family: Three times a week  . Attends Religious Services: 1 to 4 times per year  . Active Member of Clubs or Organizations: No  . Attends Archivist Meetings: Never  . Marital Status: Married  Human resources officer Violence: Not At Risk  . Fear of Current or Ex-Partner: No  . Emotionally Abused: No  . Physically Abused: No  . Sexually Abused: No    Barbarann Ehlers, RN was present throughout the entirety of the encounter.  Vitals:   07/08/19 1452  BP: 122/70  Pulse: (!) 115  Temp: 97.7 F (36.5 C)  SpO2:  95%  Weight: 118 lb (53.5 kg)  Height: 5\' 1"  (1.549 m)    Wt Readings from Last 3 Encounters:  07/08/19 118 lb (53.5 kg)  07/03/19 120 lb (54.4 kg)  07/01/19 120 lb (54.4 kg)     PHYSICAL EXAM General: NAD HEENT: Normal. Neck: No JVD, no thyromegaly. Lungs: Clear to auscultation bilaterally with normal respiratory effort. CV: Tachycardic, regular, normal S1/S2, no S3, 1/6 systolicmurmurheard over bilateral upper sternal borders. No pretibial or periankle edema.  Abdomen: Soft, nontender, no distention.  Neurologic: Alert and oriented.  Psych: Normal affect. Skin: Normal. Musculoskeletal: No gross deformities.      Labs: Lab Results  Component Value Date/Time   K 3.7 07/03/2019 11:08 AM   BUN 15 07/03/2019 11:08 AM   CREATININE 0.84 07/03/2019 11:08 AM   CREATININE 0.66 05/28/2014 10:07 AM   ALT 16 06/26/2019 11:32 AM   TSH 3.450 07/03/2019 11:08 AM   HGB 12.0 07/03/2019 11:08 AM     Lipids: Lab Results  Component Value Date/Time   LDLCALC 80 07/03/2019 11:08 AM   CHOL 152 07/03/2019 11:08 AM   TRIG 102 07/03/2019 11:08 AM   HDL 53 07/03/2019 11:08 AM      Echocardiogram 07/08/2019:  1. Left ventricular ejection fraction, by estimation, is 55 to 60%. The  left ventricle has normal  function. The left ventricle has no regional  wall motion abnormalities. There is mild concentric left ventricular  hypertrophy. Left ventricular diastolic  parameters are indeterminate.  2. Right ventricular systolic function is mildly reduced. The right  ventricular size is normal. There is normal pulmonary artery systolic  pressure.  3. Left atrial size was severely dilated.  4. Right atrial size was severely dilated.  5. MAGNA MITRAL EASE 29MM BIOPROSTHESIS VALVE is in the MV position. The  mitral valve has been repaired/replaced. No evidence of mitral valve  regurgitation.  6. EDWARDS MC3 TRICUSPID ANNULOPLASTY RING SIZE T28 is present. The  tricuspid valve is has been repaired/replaced. Tricuspid valve  regurgitation is moderate.  7. MAGNA EASE 21MM AOTIC BIOPROSTHESIc VALVE is in the AV position. The  aortic valve has been repaired/replaced. Aortic valve regurgitation is  mild.  8. The inferior vena cava is normal in size with greater than 50%  respiratory variability, suggesting right atrial pressure of 3 mmHg.    ASSESSMENT AND PLAN:  1. Valvular heart disease: Status post aortic and mitral bioprosthetic valve replacement and tricuspid valve repair with Maze procedure and repair of ascending aortic aneurysm by Dr. Roxy Manns. Currently on warfarin. She will need SBE prophylaxis prior to undergoing dental procedures.  Echocardiogram performed today demonstrates moderate tricuspid and mild aortic regurgitation.  2. Persistent atrial fibrillation and flutter: Status post Maze procedure. She has had recent recurrences as noted at the time of 2 recent ED evaluations.  I have started Lopressor 25 mg twice daily.  As she is still tachycardic with rapid atrial flutter today, I will increase Lopressor to 50 mg twice daily.  I will stop amlodipine and reduce losartan to 25 mg daily so as not to precipitate hypotension.  Continue warfarin for systemic anticoagulation.  INR 3.1 on  07/02/2019.  3. Hypertension: BP is normal.  As she is still tachycardic with rapid atrial flutter today, I will increase Lopressor to 50 mg twice daily.  I will stop amlodipine and reduce losartan to 25 mg daily so as not to precipitate hypotension.      Disposition: I have asked her to call our office  in 1 week to update [place] on her vital signs and symptoms.  She will follow-up with me as scheduled in June.  Time spent: 40 minutes, of which greater than 50% was spent reviewing symptoms, relevant blood tests and studies, and discussing management plan with the patient.    Kate Sable, M.D., F.A.C.C.

## 2019-07-08 NOTE — Progress Notes (Signed)
*  PRELIMINARY RESULTS* Echocardiogram 2D Echocardiogram has been performed.  Samuel [place] 07/08/2019, 1:53 PM

## 2019-07-09 NOTE — Progress Notes (Signed)
Pt contacted and verbalized understanding. Pt states the she had a visit with Dr.K yesterday afternoon. Dr.K decreased her Losartan to 25 mg daily and increased Metoprolol to 50 mg BID.  Pt would like a copy of blood work results mailed to her, will get medical records to print and mail results

## 2019-07-16 ENCOUNTER — Telehealth: Payer: Self-pay | Admitting: Cardiovascular Disease

## 2019-07-16 ENCOUNTER — Telehealth: Payer: Self-pay

## 2019-07-16 NOTE — Telephone Encounter (Signed)
Patient called with BP readings starting 3/31 100/69 hr 74, 125/74 hr 113, 4/1  112/68 hr72, 4/2 112/76 hr 111, 4/3 123/80 hr 74, 4/4 110/76 hr 73, 4/5 114/77 hr 72, 4/6 120/74 hr 73, 4/7 131/83 hr 74    Reviewed by Dr.koneswaran, patient may take trip to [place] as planned, no med changes.

## 2019-07-16 NOTE — Telephone Encounter (Signed)
I spoke with patient, see previous phone note.

## 2019-07-18 ENCOUNTER — Ambulatory Visit: Payer: Medicare Other | Admitting: Cardiovascular Disease

## 2019-07-29 ENCOUNTER — Other Ambulatory Visit: Payer: Self-pay

## 2019-07-29 ENCOUNTER — Ambulatory Visit (INDEPENDENT_AMBULATORY_CARE_PROVIDER_SITE_OTHER): Payer: Medicare Other | Admitting: *Deleted

## 2019-07-29 DIAGNOSIS — Z953 Presence of xenogenic heart valve: Secondary | ICD-10-CM

## 2019-07-29 DIAGNOSIS — I4819 Other persistent atrial fibrillation: Secondary | ICD-10-CM

## 2019-07-29 DIAGNOSIS — Z5181 Encounter for therapeutic drug level monitoring: Secondary | ICD-10-CM

## 2019-07-29 LAB — POCT INR: INR: 2.8 (ref 2.0–3.0)

## 2019-07-29 NOTE — Patient Instructions (Signed)
Continue warfarin 2 tablets daily except 1 1/2 tablets on Sundays, Tuesdays and Thursdays Recheck in 4 wks

## 2019-08-25 ENCOUNTER — Telehealth: Payer: Self-pay

## 2019-08-25 MED ORDER — LOSARTAN POTASSIUM 50 MG PO TABS: 50.0000 mg | ORAL_TABLET | Freq: Every day | ORAL | 3 refills | Status: DC

## 2019-08-25 NOTE — Telephone Encounter (Signed)
Hannah Roy says her BP has been elevated for the past couple days: 157/106, 167/108, HR 78  Says her eyes feel blurry and he head is heavy.    Please advise

## 2019-08-25 NOTE — Telephone Encounter (Signed)
I spoke with patient, she will increase losartan to 50 mg daily.I e-scribed to [employer]. She will keep me updated with BP readings.

## 2019-08-25 NOTE — Telephone Encounter (Signed)
Increase losartan to 50 mg daily.

## 2019-08-26 ENCOUNTER — Other Ambulatory Visit: Payer: Self-pay

## 2019-08-26 ENCOUNTER — Ambulatory Visit (INDEPENDENT_AMBULATORY_CARE_PROVIDER_SITE_OTHER): Payer: Medicare Other | Admitting: *Deleted

## 2019-08-26 DIAGNOSIS — I4819 Other persistent atrial fibrillation: Secondary | ICD-10-CM

## 2019-08-26 DIAGNOSIS — Z953 Presence of xenogenic heart valve: Secondary | ICD-10-CM | POA: Diagnosis not present

## 2019-08-26 DIAGNOSIS — Z5181 Encounter for therapeutic drug level monitoring: Secondary | ICD-10-CM

## 2019-08-26 LAB — POCT INR: INR: 3.5 — AB (ref 2.0–3.0)

## 2019-08-26 NOTE — Patient Instructions (Signed)
Hold warfarin tonight then resume 2 tablets daily except 1 1/2 tablets on Sundays, Tuesdays and Thursdays Wants to increase greens/salads Recheck in 3 wks

## 2019-09-01 ENCOUNTER — Encounter: Payer: Medicare Other | Admitting: Thoracic Surgery (Cardiothoracic Vascular Surgery)

## 2019-09-15 ENCOUNTER — Ambulatory Visit (INDEPENDENT_AMBULATORY_CARE_PROVIDER_SITE_OTHER): Payer: Medicare Other | Admitting: Thoracic Surgery (Cardiothoracic Vascular Surgery)

## 2019-09-15 ENCOUNTER — Other Ambulatory Visit: Payer: Self-pay

## 2019-09-15 ENCOUNTER — Encounter: Payer: Self-pay | Admitting: Thoracic Surgery (Cardiothoracic Vascular Surgery)

## 2019-09-15 VITALS — BP 150/102 | HR 103 | Temp 97.7°F | Resp 20 | Ht 61.0 in | Wt 122.0 lb

## 2019-09-15 DIAGNOSIS — Z9889 Other specified postprocedural states: Secondary | ICD-10-CM

## 2019-09-15 DIAGNOSIS — Z8679 Personal history of other diseases of the circulatory system: Secondary | ICD-10-CM | POA: Diagnosis not present

## 2019-09-15 DIAGNOSIS — Z953 Presence of xenogenic heart valve: Secondary | ICD-10-CM | POA: Diagnosis not present

## 2019-09-15 NOTE — Progress Notes (Signed)
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  She was last seen here in our office on December 30, 2018 at which time she was maintaining sinus rhythm and doing very well.  She states that early this spring she began to feel poorly after she received the second dose of COVID-19 vaccine.  Shortly after that she developed tachycardia associated with palpitations and shortness of breath.  She was found to be in atrial flutter and started on oral metoprolol.  Echocardiogram revealed normal left ventricular systolic function with ejection fraction estimated 55 to 60%.  Bioprosthetic tissue valve in the aortic and mitral positions were functioning normally.  There was felt to be moderate residual tricuspid regurgitation.  She was seen in follow-up by Dr. [name] on July 08, 2019 at which time she remained in atrial flutter with 2-1 AV conduction.  Her dose of metoprolol was increased to 50 mg twice daily.  She remains anticoagulated using warfarin.  Patient returns to our office today and reports that she is doing well although she states that she still does not feel as good as she did before she developed atrial flutter.  Prior to that she states that she could do absolutely anything she wanted without any symptoms of exertional shortness of breath or  fatigue.  Currently she gets tired and short of breath with moderate level activity.  She states that some days are better than others.  She still has episodes of palpitations and elevated heart rate.  Her blood pressure remains somewhat elevated.  She has not had any problems with long-term anticoagulation using warfarin.   Current Outpatient Medications  Medication Sig Dispense Refill  . acetaminophen (TYLENOL) 325 MG tablet Take 650 mg by mouth every 6 (six) hours as needed for fever.    [name] losartan (COZAAR) 50 MG tablet Take 1 tablet (50 mg total) by mouth daily. 90 tablet 3  . metoprolol tartrate (LOPRESSOR) 50 MG tablet Take 1 tablet (50 mg total) by mouth 2 (two) times daily. 180 tablet 3  . pantoprazole (PROTONIX) 40 MG tablet Take 1 tablet (40 mg total) by mouth daily before breakfast. 30 tablet 11  . rosuvastatin (CRESTOR) 5 MG tablet TAKE 1 TABLET BY MOUTH ON MONDAY, WEDNESDAY AND FRIDAY 36 tablet 1  . warfarin (COUMADIN) 2.5 MG tablet Take 2 tablets daily except 1 1/2 tablets on Sundays, Tuesdays and Thursdays 180 tablet 3  . valACYclovir (VALTREX) 1000 MG tablet 2 po twice a day for one day as needed for fever blisters (Patient not taking: Reported on 09/15/2019) 28 tablet 6   No current facility-administered medications for this visit.      Physical Exam:   BP (!) 150/102   Pulse (!) 103   Temp 97.7 F (36.5 C) (Temporal)   Resp 20   Ht 5\' 1"  (1.549  m)   Wt 122 lb (55.3 kg)   SpO2 97% Comment: RA  BMI 23.05 kg/m   General:  Well-appearing  Chest:   Clear to auscultation  CV:   Regular rate and rhythm  Incisions:  Completely healed  Abdomen:  Soft nontender  Extremities:  Warm and well-perfused  Diagnostic Tests:  2 channel telemetry rhythm strip demonstrates what appears to be atrial flutter with variable AV conduction heart rate approximately 100     ECHOCARDIOGRAM REPORT    Patient Name:  Hannah Roy Date of Exam: 07/08/2019  Medical Rec #: [id number]   Height:    61.0 in  Accession #:  [id number] Weight:    120.0 lb  Date of Birth: 1940/07/25  BSA:     1.520 m  Patient Age:  20 years  BP:      123/85 mmHg  Patient Gender: F      HR:      109 bpm.  Exam Location: [facility]   Procedure: 2D Echo, Cardiac Doppler and Color Doppler   Indications:  I49.9 (ICD-10-CM) - Irregular heart beat         R00.0 (ICD-10-CM) - Tachycardia, unspecified    History:    Patient has prior history of Echocardiogram examinations,  most         recent 10/30/2018. Aortic Valve Disease and Mitral Valve  Disease,         Arrythmias:Paroxysmal A-fib; Risk Factors:Hypertension.  Disease         and Mitral Valve Disease. Pericardial effusion,         S/P aortic + mitral valve replacement with bioprosthetic  valves         + tricuspid valve repair + maze procedure + repair  ascending         aortic aneurysm, Tricuspid regurgitation.    Sonographer:  Alvino Chapel RCS  Referring Phys: [phone] [place]    1. Left ventricular ejection fraction, by estimation, is 55 to 60%. The  left ventricle has normal function. The left ventricle has no regional  wall motion abnormalities. There is mild concentric left ventricular  hypertrophy. Left ventricular diastolic  parameters are indeterminate.  2. Right ventricular systolic function is mildly reduced. The right  ventricular size is normal. There is normal pulmonary artery systolic  pressure.  3. Left atrial size was severely dilated.  4. Right atrial size was severely dilated.  5. MAGNA MITRAL EASE 29MM BIOPROSTHESIS VALVE is in the MV position. The  mitral valve has been repaired/replaced. No evidence of mitral valve  regurgitation.  6. EDWARDS MC3 TRICUSPID ANNULOPLASTY RING SIZE T28 is present. The  tricuspid valve is has been repaired/replaced. Tricuspid valve  regurgitation  is moderate.  7. MAGNA EASE 21MM AOTIC BIOPROSTHESIc VALVE is in the AV position. The  aortic valve has been repaired/replaced. Aortic valve regurgitation is  mild.  8. The inferior vena cava is normal in size with greater than 50%  respiratory variability, suggesting right atrial pressure of 3 mmHg.   FINDINGS  Left Ventricle: Left ventricular ejection fraction, by estimation, is 55  to 60%. The left ventricle has normal function. The left ventricle has no  regional wall motion abnormalities. The left ventricular internal cavity  size was normal in size. There is  mild concentric left ventricular hypertrophy. Left ventricular diastolic  parameters are indeterminate.   Right Ventricle: The right ventricular size is normal. No increase in  right ventricular wall thickness. Right  ventricular systolic function is  mildly reduced. There is normal pulmonary artery systolic pressure. The  tricuspid regurgitant velocity is 2.28  m/s, and with an assumed right atrial pressure of 3 mmHg, the estimated  right ventricular systolic pressure is 74.2 mmHg.   Left Atrium: Left atrial size was severely dilated.   Right Atrium: Right atrial size was severely dilated.   Pericardium: There is no evidence of pericardial effusion.   Mitral Valve: MAGNA MITRAL EASE 29MM BIOPROSTHESIS VALVE is in the MV  position. The mitral valve has been repaired/replaced. No evidence of  mitral valve regurgitation.   Tricuspid Valve: EDWARDS MC3 TRICUSPID ANNULOPLASTY RING SIZE T28 is  present. The tricuspid valve is has been repaired/replaced. Tricuspid  valve regurgitation is moderate.   Aortic Valve: MAGNA EASE 21MM AOTIC BIOPROSTHESIc VALVE is in the AV  position. The aortic valve has been repaired/replaced. Aortic valve  regurgitation is mild.   Pulmonic Valve: The pulmonic valve was grossly normal. Pulmonic valve  regurgitation is not visualized.   Aorta: The aortic root is normal in size and  structure.   Venous: The inferior vena cava is normal in size with greater than 50%  respiratory variability, suggesting right atrial pressure of 3 mmHg.   IAS/Shunts: No atrial level shunt detected by color flow Doppler.     LEFT VENTRICLE  PLAX 2D  LVIDd:     4.46 cm  LVIDs:     3.49 cm  LV PW:     1.16 cm  LV IVS:    1.13 cm  LVOT diam:   1.60 cm  LV SV:     42  LV SV Index:  28  LVOT Area:   2.01 cm     RIGHT VENTRICLE  RV Mid diam:  2.77 cm  RV S prime:   6.97 cm/s  TAPSE (M-mode): 1.2 cm   LEFT ATRIUM       Index  Hannah diam:    3.60 cm 2.37 cm/m  Hannah Vol (A2C):  41.0 ml 26.97 ml/m  Hannah Vol (A4C):  79.9 ml 52.56 ml/m  Hannah Biplane Vol: 57.8 ml 38.02 ml/m  AORTIC VALVE  LVOT Vmax:  112.00 cm/s  LVOT Vmean: 74.400 cm/s  LVOT VTI:  0.211 m    AORTA  Ao Root diam: 3.20 cm   MITRAL VALVE        TRICUSPID VALVE  MV Area (PHT): 3.60 cm   TR Peak grad:  20.8 mmHg  MV Decel Time: 211 msec   TR Vmax:    228.00 cm/s  MV E velocity: 144.00 cm/s               SHUNTS               Systemic VTI: 0.21 m               Systemic Diam: 1.60 cm   Kate Sable MD  Electronically signed by Kate Sable MD  Signature Date/Time: 07/08/2019/2:12:27 PM    Impression:  Patient is doing well approximately 1 year 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  However, several months ago she developed symptomatic atypical atrial flutter and she remains in atrial flutter at this time.  Heart rate appears to be under little better control on metoprolol 50 mg twice daily, but there may still be some room for improvement.  Moreover, given the fact the patient has previously undergone Maze procedure she  might benefit from an attempt at cardioversion and/or catheter-based  ablation.    Plan:  We have not recommended any change the patient's current medications.  However, I recommended that the patient continue to monitor her pulse and blood pressure on a regular basis and discuss with Dr. [name] whether or not she may wish to increase her dose of metoprolol further.  Finally, we discussed the possibility that she might be referred to the atrial fibrillation clinic for formal EP consultation to consider an attempt at catheter-based ablation.  All of her questions have been addressed.  I spent in excess of 15 minutes during the conduct of this office consultation and >50% of this time involved direct face-to-face encounter with the patient for counseling and/or coordination of their care.    Valentina Gu. Roxy Manns, MD 09/15/2019 11:38 AM

## 2019-09-15 NOTE — Patient Instructions (Addendum)
Continue all previous medications without any changes at this time  Check your heart rate and blood pressure daily if possible  Discuss with Dr. [name] whether or not you should increase your dose of metoprolol and/or be seen for consultation in the Atrial Fibrillation clinic to consider catheter ablation for atrial flutter  Endocarditis is a potentially serious infection of heart valves or inside lining of the heart.  It occurs more commonly in patients with diseased heart valves (such as patient's with aortic or mitral valve disease) and in patients who have undergone heart valve repair or replacement.  Certain surgical and dental procedures may put you at risk, such as dental cleaning, other dental procedures, or any surgery involving the respiratory, urinary, gastrointestinal tract, gallbladder or prostate gland.   To minimize your chances for develooping endocarditis, maintain good oral health and seek prompt medical attention for any infections involving the mouth, teeth, gums, skin or urinary tract.    Always notify your doctor or dentist about your underlying heart valve condition before having any invasive procedures. You will need to take antibiotics before certain procedures, including all routine dental cleanings or other dental procedures.  Your cardiologist or dentist should prescribe these antibiotics for you to be taken ahead of time.

## 2019-09-16 ENCOUNTER — Ambulatory Visit (INDEPENDENT_AMBULATORY_CARE_PROVIDER_SITE_OTHER): Payer: Medicare Other | Admitting: *Deleted

## 2019-09-16 DIAGNOSIS — I4891 Unspecified atrial fibrillation: Secondary | ICD-10-CM

## 2019-09-16 DIAGNOSIS — H43391 Other vitreous opacities, right eye: Secondary | ICD-10-CM | POA: Diagnosis not present

## 2019-09-16 DIAGNOSIS — Z961 Presence of intraocular lens: Secondary | ICD-10-CM | POA: Diagnosis not present

## 2019-09-16 DIAGNOSIS — I10 Essential (primary) hypertension: Secondary | ICD-10-CM | POA: Diagnosis not present

## 2019-09-16 DIAGNOSIS — H43811 Vitreous degeneration, right eye: Secondary | ICD-10-CM | POA: Diagnosis not present

## 2019-09-16 DIAGNOSIS — H3561 Retinal hemorrhage, right eye: Secondary | ICD-10-CM | POA: Diagnosis not present

## 2019-09-16 DIAGNOSIS — H43393 Other vitreous opacities, bilateral: Secondary | ICD-10-CM | POA: Diagnosis not present

## 2019-09-16 DIAGNOSIS — Z953 Presence of xenogenic heart valve: Secondary | ICD-10-CM | POA: Diagnosis not present

## 2019-09-16 DIAGNOSIS — I4819 Other persistent atrial fibrillation: Secondary | ICD-10-CM

## 2019-09-16 DIAGNOSIS — Z5181 Encounter for therapeutic drug level monitoring: Secondary | ICD-10-CM | POA: Diagnosis not present

## 2019-09-16 DIAGNOSIS — H31091 Other chorioretinal scars, right eye: Secondary | ICD-10-CM | POA: Diagnosis not present

## 2019-09-16 LAB — POCT INR: INR: 2.1 (ref 2.0–3.0)

## 2019-09-16 NOTE — Patient Instructions (Signed)
Continue warfarin 2 tablets daily except 1 1/2 tablets on Sundays, Tuesdays and Thursdays Continue greens/salads Recheck in 4 wks 

## 2019-09-26 ENCOUNTER — Telehealth: Payer: Self-pay | Admitting: Licensed Clinical Social Worker

## 2019-09-26 NOTE — Telephone Encounter (Signed)
CSW contacted patient to invite to the [employer] to be held at [employer] on 10-01-2019 at 4 pm. CSW left message for return call. Raquel Sarna, [place], [place]

## 2019-09-30 ENCOUNTER — Telehealth: Payer: Medicare Other | Admitting: Cardiovascular Disease

## 2019-10-01 ENCOUNTER — Emergency Department (HOSPITAL_COMMUNITY): Payer: Medicare Other

## 2019-10-01 ENCOUNTER — Telehealth: Payer: Self-pay | Admitting: Cardiovascular Disease

## 2019-10-01 ENCOUNTER — Emergency Department (HOSPITAL_COMMUNITY)
Admission: EM | Admit: 2019-10-01 | Discharge: 2019-10-01 | Disposition: A | Discharge status: Home / Self Care | Payer: Medicare Other | Attending: Emergency Medicine | Admitting: Emergency Medicine

## 2019-10-01 ENCOUNTER — Encounter (HOSPITAL_COMMUNITY): Payer: Self-pay

## 2019-10-01 ENCOUNTER — Other Ambulatory Visit: Payer: Self-pay

## 2019-10-01 DIAGNOSIS — Z87891 Personal history of nicotine dependence: Secondary | ICD-10-CM | POA: Insufficient documentation

## 2019-10-01 DIAGNOSIS — Z91040 Latex allergy status: Secondary | ICD-10-CM | POA: Diagnosis not present

## 2019-10-01 DIAGNOSIS — Z7901 Long term (current) use of anticoagulants: Secondary | ICD-10-CM | POA: Insufficient documentation

## 2019-10-01 DIAGNOSIS — I5033 Acute on chronic diastolic (congestive) heart failure: Secondary | ICD-10-CM | POA: Diagnosis not present

## 2019-10-01 DIAGNOSIS — R519 Headache, unspecified: Secondary | ICD-10-CM | POA: Insufficient documentation

## 2019-10-01 DIAGNOSIS — Z79899 Other long term (current) drug therapy: Secondary | ICD-10-CM | POA: Diagnosis not present

## 2019-10-01 DIAGNOSIS — I110 Hypertensive heart disease with heart failure: Secondary | ICD-10-CM | POA: Insufficient documentation

## 2019-10-01 DIAGNOSIS — Z9861 Coronary angioplasty status: Secondary | ICD-10-CM | POA: Insufficient documentation

## 2019-10-01 DIAGNOSIS — E039 Hypothyroidism, unspecified: Secondary | ICD-10-CM | POA: Insufficient documentation

## 2019-10-01 DIAGNOSIS — I10 Essential (primary) hypertension: Secondary | ICD-10-CM

## 2019-10-01 LAB — CBC
HCT: 37.8 % (ref 36.0–46.0)
Hemoglobin: 12.2 g/dL (ref 12.0–15.0)
MCH: 30.8 pg (ref 26.0–34.0)
MCHC: 32.3 g/dL (ref 30.0–36.0)
MCV: 95.5 fL (ref 80.0–100.0)
Platelets: 172 10*3/uL (ref 150–400)
RBC: 3.96 MIL/uL (ref 3.87–5.11)
RDW: 12.2 % (ref 11.5–15.5)
WBC: 5.1 10*3/uL (ref 4.0–10.5)
nRBC: 0 % (ref 0.0–0.2)

## 2019-10-01 LAB — BASIC METABOLIC PANEL
Anion gap: 11 (ref 5–15)
BUN: 13 mg/dL (ref 8–23)
CO2: 26 mmol/L (ref 22–32)
Calcium: 9.2 mg/dL (ref 8.9–10.3)
Chloride: 103 mmol/L (ref 98–111)
Creatinine, Ser: 0.71 mg/dL (ref 0.44–1.00)
GFR calc Af Amer: >60 mL/min (ref >60)
GFR calc non Af Amer: >60 mL/min (ref >60)
Glucose, Bld: 97 mg/dL (ref 70–99)
Potassium: 3.8 mmol/L (ref 3.5–5.1)
Sodium: 140 mmol/L (ref 135–145)

## 2019-10-01 LAB — TROPONIN I (HIGH SENSITIVITY)
Troponin I (High Sensitivity): 7 ng/L (ref <18)
Troponin I (High Sensitivity): 8 ng/L (ref <18)

## 2019-10-01 LAB — PROTIME-INR
INR: 2.6 — ABNORMAL HIGH (ref 0.8–1.2)
Prothrombin Time: 26.6 seconds — ABNORMAL HIGH (ref 11.4–15.2)

## 2019-10-01 MED ORDER — AMLODIPINE BESYLATE 5 MG PO TABS
5.0000 mg | ORAL_TABLET | Freq: Once | ORAL | Status: AC
  Administered 2019-10-01: 5 mg via ORAL
  Filled 2019-10-01: qty 1

## 2019-10-01 MED ORDER — AMLODIPINE BESYLATE 5 MG PO TABS
5.0000 mg | ORAL_TABLET | Freq: Every day | ORAL | 0 refills | Status: DC
Start: 2019-10-01 — End: 2019-10-27

## 2019-10-01 MED ORDER — HYDRALAZINE HCL 20 MG/ML IJ SOLN
10.0000 mg | Freq: Once | INTRAMUSCULAR | Status: AC
  Administered 2019-10-01: 10 mg via INTRAVENOUS
  Filled 2019-10-01: qty 1

## 2019-10-01 NOTE — Discharge Instructions (Addendum)
Follow-up on June 9 in the cardiology office.  Increase the Losartan to 100 mg daily.  Take 2 pills instead of your normal 1 pill.  Start Norvasc at 5 mg a day.

## 2019-10-01 NOTE — ED Provider Notes (Signed)
BP improved, will d/c based on prior plan with Dr. Alvino Chapel.   Sherwood Gambler, MD 10/01/19 1710

## 2019-10-01 NOTE — ED Provider Notes (Signed)
[facility] Provider Note   CSN: [id number] Arrival date & time: 10/01/19  1021     History No chief complaint on file.   Hannah Roy is a 79 y.o. female.  HPI Patient presents with hypertension.  History of hypertension.  On anticoagulation due to atrial flutter.  Also had previous aortic valve replacement and mitral valve replacement.  Also has had tricuspid valve repair.  States her blood pressure was elevated this morning.  Approximate 190/120.  Took her blood pressure medicine.  Has a dull headache.  No numbness weakness but states she just feels bad all over.  No fevers or chills.  No cough.  States she had elevated blood pressure up to 140 yesterday.  Has followed up with Dr. Roxy Manns after her valve repair and states she was doing well.  States she began to feel bad however shortly after that visit which was around 2 weeks ago.  States she called her cardiologist, Dr. Raliegh Ip this morning but was unable to get through to him.    Past Medical History:  Diagnosis Date  . Aortic atherosclerosis ([place])   . Aortic insufficiency, rheumatic   . 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 Atrial fibrillation ([place]) 05/10/2018  . Colon polyps   . Essential hypertension, benign   . History of seasonal allergies   . Hypertension   . Hypothyroidism   . Mitral stenosis with regurgitation, rheumatic   . Osteopenia 2006  . Persistent atrial fibrillation ([place])   . 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   21 mm [employer] Ease stented bovine pericardial tissue valve  . S/P ascending aortic aneurysm repair 09/05/2018   28 mm [employer] platinum supracoronary straight graft  . S/P Maze operation for atrial fibrillation 09/05/2018   Complete bilateral atrial lesion set using bipolar radiofrequency and cryothermy ablation with clipping of LA appendage  . 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   29 mm [facility] Mitral stented bovine  pericardial tissue valve  . 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 Tricuspid regurgitation     Patient Active Problem List   Diagnosis Date Noted  . Duodenal ulcer 10/28/2018  . Pericardial effusion 10/28/2018  . UGI bleed 10/14/2018  . Paroxysmal A-fib ([place])   . Melena   . Symptomatic anemia 10/07/2018  . Encounter for therapeutic drug monitoring 09/24/2018  . Dysphagia 09/23/2018  . Hoarseness of voice 09/23/2018  . 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  . 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 S/P Maze operation for atrial fibrillation 09/05/2018  . S/P ascending aortic aneurysm repair 09/05/2018  . Orthostatic hypotension 09/01/2018  . Aortic insufficiency, rheumatic   . Mitral stenosis with regurgitation, rheumatic   . Tricuspid regurgitation   . 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 Orthostasis 08/31/2018  . Acute on chronic diastolic heart failure ([place]) 08/31/2018  . Rheumatic heart disease   . Persistent atrial fibrillation ([place])   . Atrial fibrillation with RVR ([place]) 05/10/2018  . Hypothyroidism   . Aortic atherosclerosis ([place]) 05/07/2018  . History of colonic polyps 08/03/2016  . Osteoarthritis of right knee 10/17/2013  . Allergic rhinitis 07/30/2012  . 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 Osteopenia 07/30/2012  . Essential hypertension, benign     Past Surgical History:  Procedure Laterality Date  . AORTIC VALVE REPLACEMENT N/A 09/05/2018  Procedure: AORTIC VALVE REPLACEMENT (AVR) USING MAGNA EASE 21MM AOTIC BIOPROSTHESIS VALVE;  Surgeon: Rexene Alberts, MD;  Location: [facility];  Service: Open Heart Surgery;  Laterality: N/A;  . APPENDECTOMY    . CLIPPING OF ATRIAL APPENDAGE  09/05/2018   Procedure: Clipping Of Atrial Appendage;  Surgeon: Rexene Alberts, MD;  Location: [facility];  Service: Open Heart Surgery;;  . COLONOSCOPY   03/23/2011   repeat in 5 years,Procedure: COLONOSCOPY;  Surgeon: Rogene Houston, MD;  [facility];  Service: Endoscopy;  Laterality: N/A;  1:00  . COLONOSCOPY N/A 11/09/2016   Procedure: COLONOSCOPY;  Surgeon: Rogene Houston, MD;  [facility];  Service: Endoscopy;  Laterality: N/A;  1030  . ESOPHAGOGASTRODUODENOSCOPY N/A 10/10/2018   Procedure: ESOPHAGOGASTRODUODENOSCOPY (EGD);  Surgeon: Doran Stabler, MD;  Location: [facility];  Service: Gastroenterology;  Laterality: N/A;  . ESOPHAGOGASTRODUODENOSCOPY (EGD) WITH PROPOFOL N/A 10/08/2018   Procedure: ESOPHAGOGASTRODUODENOSCOPY (EGD) WITH PROPOFOL;  Surgeon: Danie Binder, MD;  [facility];  Service: Endoscopy;  Laterality: N/A;  . ESOPHAGOGASTRODUODENOSCOPY (EGD) WITH PROPOFOL N/A 11/15/2018   Procedure: ESOPHAGOGASTRODUODENOSCOPY (EGD) WITH PROPOFOL;  Surgeon: Rogene Houston, MD;  [facility];  Service: Endoscopy;  Laterality: N/A;  12:55  . HOT HEMOSTASIS N/A 10/10/2018   Procedure: HOT HEMOSTASIS (ARGON PLASMA COAGULATION/BICAP);  Surgeon: Doran Stabler, MD;  Location: [facility];  Service: Gastroenterology;  Laterality: N/A;  . IR ANGIOGRAM SELECTIVE EACH ADDITIONAL VESSEL  10/08/2018  . IR ANGIOGRAM VISCERAL SELECTIVE  10/08/2018  . IR EMBO ART  VEN HEMORR LYMPH EXTRAV  INC GUIDE ROADMAPPING  10/08/2018  . IR US GUIDE VASC ACCESS RIGHT  10/08/2018  . MAZE N/A 09/05/2018   Procedure: MAZE;  Surgeon: Rexene Alberts, MD;  Location: [facility];  Service: Open Heart Surgery;  Laterality: N/A;  . MITRAL VALVE REPLACEMENT N/A 09/05/2018   Procedure: MITRAL VALVE (MV) REPLACEMENT USING MAGNA MITRAL EASE 29MM BIOPROSTHESIS VALVE;  Surgeon: Rexene Alberts, MD;  Location: [facility];  Service: Open Heart Surgery;  Laterality: N/A;  . REPLACEMENT ASCENDING AORTA N/A 09/05/2018   Procedure: SUPRACORONARY STRAIGHT GRAFT REPLACEMENT OF ASCENDING AORTA;  Surgeon: Rexene Alberts, MD;  Location: [facility];   Service: Open Heart Surgery;  Laterality: N/A;  . RIGHT/LEFT HEART CATH AND CORONARY ANGIOGRAPHY N/A 08/29/2018   Procedure: RIGHT/LEFT HEART CATH AND CORONARY ANGIOGRAPHY;  Surgeon: Lorretta Harp, MD;  Location: [facility];  Service: Cardiovascular;  Laterality: N/A;  . TEE WITHOUT CARDIOVERSION N/A 08/29/2018   Procedure: TRANSESOPHAGEAL ECHOCARDIOGRAM (TEE);  Surgeon: Skeet Latch, MD;  Location: [facility];  Service: Cardiovascular;  Laterality: N/A;  . TRICUSPID VALVE REPLACEMENT N/A 09/05/2018   Procedure: TRICUSPID VALVE REPAIR USING [employer] MC3 TRICUSPID ANNULOPLASTY RING SIZE T28;  Surgeon: Rexene Alberts, MD;  Location: [facility];  Service: Open Heart Surgery;  Laterality: N/A;  . TUBAL LIGATION       OB History   No obstetric history on file.     Family History  Problem Relation Age of Onset  . Colon cancer Daughter        about 18    Social History   Tobacco Use  . Smoking status: Former Smoker    Packs/day: 0.50    Years: 10.00    Pack years: 5.00    Types: Cigarettes  . Smokeless tobacco: Never Used  Vaping Use  . Vaping Use: Never used  Substance Use Topics  .  Alcohol use: No  . Drug use: No    Home Medications Prior to Admission medications   Medication Sig Start Date End Date Taking? Authorizing Provider  acetaminophen (TYLENOL) 325 MG tablet Take 650 mg by mouth every 6 (six) hours as needed for fever.    [provider]  amLODipine (NORVASC) 5 MG tablet Take 1 tablet (5 mg total) by mouth daily. 10/01/19   Davonna Belling, MD  losartan (COZAAR) 50 MG tablet Take 1 tablet (50 mg total) by mouth daily. 08/25/19 11/23/19  Herminio Commons, MD  metoprolol tartrate (LOPRESSOR) 50 MG tablet Take 1 tablet (50 mg total) by mouth 2 (two) times daily. 07/08/19 10/06/19  Herminio Commons, MD  pantoprazole (PROTONIX) 40 MG tablet Take 1 tablet (40 mg total) by mouth daily before breakfast. 11/15/18 09/15/19  Rehman, Mechele Dawley, MD   rosuvastatin (CRESTOR) 5 MG tablet TAKE 1 TABLET BY MOUTH ON MONDAY, [facility] AND FRIDAY 06/17/19   Kathyrn Drown, MD  valACYclovir (VALTREX) 1000 MG tablet 2 po twice a day for one day as needed for fever blisters Patient not taking: Reported on 09/15/2019 06/17/19   Kathyrn Drown, MD  warfarin (COUMADIN) 2.5 MG tablet Take 2 tablets daily except 1 1/2 tablets on Sundays, Tuesdays and Thursdays 04/28/19   Herminio Commons, MD    Allergies    Bee venom, Boniva [ibandronic acid], Lisinopril, and Latex  Review of Systems   Review of Systems  Constitutional: Negative for appetite change, fatigue and fever.  HENT: Negative for congestion.   Respiratory: Negative for shortness of breath.   Cardiovascular: Negative for chest pain.  Gastrointestinal: Negative for abdominal pain.  Musculoskeletal: Negative for back pain.  Skin: Negative for rash and wound.  Neurological: Positive for headaches.  Psychiatric/Behavioral: Negative for confusion.    Physical Exam Updated Vital Signs BP (!) 162/111 (BP Location: Left Arm)   Pulse (!) 107   Temp 98.1 F (36.7 C) (Oral)   Resp 16   Ht 5\' 1"  (1.549 m)   Wt 54.4 kg   SpO2 97%   BMI 22.67 kg/m   Physical Exam Vitals reviewed.  HENT:     Head: Normocephalic.  Eyes:     Extraocular Movements: Extraocular movements intact.     Pupils: Pupils are equal, round, and reactive to light.  Cardiovascular:     Rate and Rhythm: Rhythm irregular.     Comments: Mildly aortic rhythm.  No severe murmur. Abdominal:     Tenderness: There is no abdominal tenderness.  Musculoskeletal:        General: No tenderness.     Cervical back: Neck supple.  Skin:    General: Skin is warm.     Capillary Refill: Capillary refill takes less than 2 seconds.  Neurological:     Mental Status: She is alert and oriented to person, place, and time.     ED Results / Procedures / Treatments   Labs (all labs ordered are listed, but only abnormal results are  displayed) Labs Reviewed  PROTIME-INR - Abnormal; Notable for the following components:      Result Value   Prothrombin Time 26.6 (*)    INR 2.6 (*)    All other components within normal limits  CBC  BASIC METABOLIC PANEL  TROPONIN I (HIGH SENSITIVITY)  TROPONIN I (HIGH SENSITIVITY)    EKG EKG Interpretation  Date/Time:  Wednesday October 01 2019 12:01:09 EDT Ventricular Rate:  93 PR Interval:    QRS  Duration: 156 QT Interval:  404 QTC Calculation: 502 R Axis:   71 Text Interpretation: Atrial flutter with 2 to 1 block Right bundle branch block Abnormal ECG No significant change since last tracing Reconfirmed by Davonna Belling [phone]) on 10/01/2019 12:13:52 PM   Radiology DG Chest 2 View  Result Date: 10/01/2019 CLINICAL DATA:  Hypertension today. EXAM: CHEST - 2 VIEW COMPARISON:  PA and lateral chest 07/01/2019.  CT chest 06/26/2019. FINDINGS: The patient is status post clipping of the atrial appendage and aortic, tricuspid and mitral valve surgery. There is cardiomegaly without edema. Lungs clear. No pneumothorax or pleural effusion. No acute or focal bony abnormality. Vascular coils in the upper abdomen are noted. IMPRESSION: No acute disease. Electronically Signed   By: Inge Rise M.D.   On: 10/01/2019 13:03   CT Head Wo Contrast  Result Date: 10/01/2019 CLINICAL DATA:  Headaches EXAM: CT HEAD WITHOUT CONTRAST TECHNIQUE: Contiguous axial images were obtained from the base of the skull through the vertex without intravenous contrast. COMPARISON:  07/01/2019 FINDINGS: Brain: No evidence of acute infarction, hemorrhage, hydrocephalus, extra-axial collection or mass lesion/mass effect. Vascular: No hyperdense vessel or unexpected calcification. Skull: Normal. Negative for fracture or focal lesion. Sinuses/Orbits: No acute finding. Other: None. IMPRESSION: No acute intracranial abnormality noted. Electronically Signed   By: Inez Catalina M.D.   On: 10/01/2019 15:46    Procedures  Procedures (including critical care time)  Medications Ordered in ED Medications  hydrALAZINE (APRESOLINE) injection 10 mg (has no administration in time range)  amLODipine (NORVASC) tablet 5 mg (has no administration in time range)    ED Course  I have reviewed the triage vital signs and the nursing notes.  Pertinent labs & imaging results that were available during my care of the patient were reviewed by me and considered in my medical decision making (see chart for details).    MDM Rules/Calculators/A&P                          Patient with hypertension history of same.  Also in atrial flutter which patient has some chronically.  Does have dull headache which I think likely secondary to the blood pressure.  Took her blood pressure pills this morning.  Head CT done due to anticoagulation headache and is reassuring.  Does not appear to have endorgan damage with the hypertension.  Discussed with Dr. Harl Bowie from cardiology.  He has made some medications adjustment and suggested given 10 mg of IV hydralazine here.  Given first dose of her p.o. Norvasc also.  Will increase losartan up to 100 mg a day from 50.  They have arranged follow-up in 6 days at the cardiology clinic also.  Discharge home after hydralazine Final Clinical Impression(s) / ED Diagnoses Final diagnoses:  Hypertension, unspecified type    Rx / DC Orders ED Discharge Orders         Ordered    amLODipine (NORVASC) 5 MG tablet  Daily     Discontinue  Reprint     10/01/19 1609           Davonna Belling, MD 10/01/19 [phone]

## 2019-10-01 NOTE — ED Triage Notes (Signed)
Pt states her BP 189/121. Pt has taken BP medicine this morning at 0700. Pt states right arm is hurting. She also has a headache. Bp here now is 167/113.

## 2019-10-01 NOTE — Telephone Encounter (Signed)
Patient called and said her BP was 167/107 at 7:15 am, she's tried to check it again since then but the bp machine is saying error. She would like for a nurse to give her a call [phone] (h) [phone] (C)

## 2019-10-01 NOTE — Progress Notes (Signed)
Patient presents to ER with significant HTN and headache. From chart review has had some ongoing issues wit high bp's, losartan recently increased to 50mg  daily. Would recommend increasing losartan to 100mg  daily and also start norvasc 5mg  daily. If persistent high bp's in ER could give 10mg  IV hydralazine. We will arrange a nursing visit for bp check early next week   Zandra Abts MD

## 2019-10-02 ENCOUNTER — Telehealth: Payer: Self-pay

## 2019-10-02 ENCOUNTER — Telehealth: Payer: Self-pay | Admitting: Cardiovascular Disease

## 2019-10-02 NOTE — Telephone Encounter (Signed)
Patient went to ER 10/01/19 due to increase in BP and her Losartan was increased to 100MG  and prescription was not sent in to pharmacy

## 2019-10-02 NOTE — Telephone Encounter (Signed)
BP yesterday 189/121, went to ED, was started back on amlodipine 5 mg and wanted to be sure she should take. It had been stopped in March due to possible hypotension after we increased her metoprolol  So, today she took an extra 50 mg of losartan (total 100 mg) on her own this am and did not start the amlodipine yet.   BP now 116/68, HR 109  I instructed her not to take extra losartan and to take amlodipine as directed. She is going to keep a BP log.

## 2019-10-02 NOTE — Telephone Encounter (Signed)
Agree with your recommendations 

## 2019-10-02 NOTE — Telephone Encounter (Signed)
New message    Patient calling about her bp being high, she was in ER yesterday and they changed her medication but she has questions before she starts taking the medication.

## 2019-10-07 ENCOUNTER — Other Ambulatory Visit: Payer: Self-pay

## 2019-10-07 ENCOUNTER — Ambulatory Visit (INDEPENDENT_AMBULATORY_CARE_PROVIDER_SITE_OTHER): Payer: Medicare Other

## 2019-10-07 VITALS — BP 124/84 | HR 102 | Ht 61.0 in

## 2019-10-07 DIAGNOSIS — I4891 Unspecified atrial fibrillation: Secondary | ICD-10-CM | POA: Diagnosis not present

## 2019-10-07 NOTE — Patient Instructions (Signed)
Continue medications as prescribed.   Continue daily blood pressure/ heart rate log.   You have been referred to A-fib clinic in [place]. They will call you to set up appointment.    Keep appointment with Bernerd Pho, PA-C on 10/15/19 at 1:30 pm

## 2019-10-14 ENCOUNTER — Ambulatory Visit: Payer: Medicare Other | Admitting: Student

## 2019-10-14 ENCOUNTER — Ambulatory Visit (INDEPENDENT_AMBULATORY_CARE_PROVIDER_SITE_OTHER): Payer: Medicare Other | Admitting: *Deleted

## 2019-10-14 ENCOUNTER — Other Ambulatory Visit: Payer: Self-pay

## 2019-10-14 DIAGNOSIS — Z5181 Encounter for therapeutic drug level monitoring: Secondary | ICD-10-CM | POA: Diagnosis not present

## 2019-10-14 DIAGNOSIS — Z953 Presence of xenogenic heart valve: Secondary | ICD-10-CM

## 2019-10-14 DIAGNOSIS — I4819 Other persistent atrial fibrillation: Secondary | ICD-10-CM

## 2019-10-14 LAB — POCT INR: INR: 3.9 — AB (ref 2.0–3.0)

## 2019-10-14 NOTE — Patient Instructions (Signed)
Hold warfarin tonight then resume 2 tablets daily except 1 1/2 tablets on Sundays, Tuesdays and Thursdays Continue greens/salads Recheck in 3 wks

## 2019-10-15 ENCOUNTER — Ambulatory Visit (INDEPENDENT_AMBULATORY_CARE_PROVIDER_SITE_OTHER): Payer: Medicare Other | Admitting: Student

## 2019-10-15 ENCOUNTER — Encounter: Payer: Self-pay | Admitting: Student

## 2019-10-15 VITALS — BP 118/70 | HR 56 | Ht 61.0 in | Wt 124.0 lb

## 2019-10-15 DIAGNOSIS — I38 Endocarditis, valve unspecified: Secondary | ICD-10-CM | POA: Diagnosis not present

## 2019-10-15 DIAGNOSIS — I10 Essential (primary) hypertension: Secondary | ICD-10-CM | POA: Diagnosis not present

## 2019-10-15 DIAGNOSIS — I2510 Atherosclerotic heart disease of native coronary artery without angina pectoris: Secondary | ICD-10-CM | POA: Diagnosis not present

## 2019-10-15 DIAGNOSIS — I4892 Unspecified atrial flutter: Secondary | ICD-10-CM | POA: Diagnosis not present

## 2019-10-15 NOTE — Progress Notes (Signed)
Cardiology Office Note    Date:  10/15/2019   ID:  Hannah Roy, DOB 1940-08-08, MRN [id number]  PCP:  Kathyrn Drown, MD  Cardiologist: Rozann Lesches, MD    Chief Complaint  Patient presents with  . Follow-up    recent Emergency Dept visit    History of Present Illness:    Hannah Roy is a 79 y.o. 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  She was last examined by Dr. [name] in 06/2019 following a recent Emergency Department evaluation for palpitations and tachycardia. She was found to be in recurrent atrial flutter with RVR.  Lopressor was titrated to 50 mg twice daily at the time of her visit with Amlodipine being discontinued and Losartan reduced to 25 mg daily. She was continued on Coumadin for anticoagulation. She did follow-up with Dr. Roxy Manns on 09/15/2019 and she was continued on Lopressor at her current dosing but it was recommended to consider the possibility of her being referred to the atrial fibrillation clinic for formal EP consultation in regards to possible DCCV or catheter ablation given her prior Maze procedure.  In the interim, she presented to the ED on 10/01/2019 for evaluation of elevated BP with an associated headache. BP had been elevated to 189/121 when checked at home. IV Hydralazine was administered while in the ED with improvement in her symptoms. Losartan was titrated to 100 mg daily and she was also restarted on Amlodipine 5 mg daily.  In talking with the patient today, she reports feeling fatigued  since being diagnosed with atrial flutter earlier this year but denies any specific chest pain or palpitations. Breathing has been at baseline and she denies any specific orthopnea, PND or lower extremity edema. She has been going for daily walks and denies any anginal symptoms with this.  She reports her BP dropped significantly with titration of Losartan to 100 mg daily and therefore she has only been taking 50 mg daily. She brings with her today a very detailed blood pressure log and SBP is typically in the 120's to 130's in the mornings and drops into the low 100s to 110s in the afternoon.  BP is at 118/70 during today's visit.  Past Medical History:  Diagnosis Date  . Aortic atherosclerosis ([place])   . Aortic insufficiency, rheumatic   . 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 Atrial fibrillation ([place]) 05/10/2018  . Colon polyps   . Essential hypertension, benign   . History of seasonal allergies   . Hypertension   . Hypothyroidism   . Mitral stenosis with regurgitation, rheumatic   . Osteopenia 2006  . Persistent atrial fibrillation ([place])   . 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   21 mm [facility] Ease stented bovine pericardial tissue valve  . S/P ascending aortic aneurysm repair 09/05/2018   28 mm Hemashield platinum supracoronary straight graft  . S/P Maze operation for atrial fibrillation 09/05/2018   Complete bilateral atrial lesion set using bipolar radiofrequency and cryothermy ablation with clipping of LA appendage  . 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   29 mm Oletta Lamas  Magna Mitral stented bovine pericardial tissue valve  . 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 Tricuspid regurgitation     Past Surgical History:  Procedure Laterality Date  . AORTIC VALVE REPLACEMENT N/A 09/05/2018   Procedure: AORTIC VALVE REPLACEMENT (AVR) USING MAGNA EASE 21MM AOTIC BIOPROSTHESIS VALVE;  Surgeon:  Rexene Alberts, MD;  Location: [place];  Service: Open Heart Surgery;  Laterality: N/A;  . APPENDECTOMY    . CLIPPING OF ATRIAL APPENDAGE  09/05/2018   Procedure: Clipping Of Atrial Appendage;  Surgeon: Rexene Alberts, MD;  Location: [place];  Service: Open Heart Surgery;;  . COLONOSCOPY  03/23/2011   repeat in 5 years,Procedure: COLONOSCOPY;  Surgeon: Rogene Houston, MD;  [facility];  Service: Endoscopy;  Laterality: N/A;  1:00  . COLONOSCOPY N/A 11/09/2016   Procedure: COLONOSCOPY;  Surgeon: Rogene Houston, MD;  [facility];  Service: Endoscopy;  Laterality: N/A;  1030  . ESOPHAGOGASTRODUODENOSCOPY N/A 10/10/2018   Procedure: ESOPHAGOGASTRODUODENOSCOPY (EGD);  Surgeon: Doran Stabler, MD;  Location: [place];  Service: Gastroenterology;  Laterality: N/A;  . ESOPHAGOGASTRODUODENOSCOPY (EGD) WITH PROPOFOL N/A 10/08/2018   Procedure: ESOPHAGOGASTRODUODENOSCOPY (EGD) WITH PROPOFOL;  Surgeon: Danie Binder, MD;  [facility];  Service: Endoscopy;  Laterality: N/A;  . ESOPHAGOGASTRODUODENOSCOPY (EGD) WITH PROPOFOL N/A 11/15/2018   Procedure: ESOPHAGOGASTRODUODENOSCOPY (EGD) WITH PROPOFOL;  Surgeon: Rogene Houston, MD;  [facility];  Service: Endoscopy;  Laterality: N/A;  12:55  . HOT HEMOSTASIS N/A 10/10/2018   Procedure: HOT HEMOSTASIS (ARGON PLASMA COAGULATION/BICAP);  Surgeon: Doran Stabler, MD;  Location: [place];  Service: Gastroenterology;  Laterality: N/A;  . IR ANGIOGRAM SELECTIVE EACH ADDITIONAL VESSEL  10/08/2018  . IR ANGIOGRAM VISCERAL SELECTIVE  10/08/2018  . IR EMBO ART  VEN HEMORR LYMPH EXTRAV  INC GUIDE ROADMAPPING  10/08/2018  . IR US GUIDE VASC ACCESS RIGHT  10/08/2018  . MAZE N/A 09/05/2018   Procedure: MAZE;  Surgeon: Rexene Alberts, MD;  Location: [place];  Service: Open Heart Surgery;  Laterality: N/A;  . MITRAL VALVE REPLACEMENT N/A 09/05/2018   Procedure: MITRAL VALVE (MV) REPLACEMENT USING MAGNA MITRAL EASE 29MM  BIOPROSTHESIS VALVE;  Surgeon: Rexene Alberts, MD;  Location: [place];  Service: Open Heart Surgery;  Laterality: N/A;  . REPLACEMENT ASCENDING AORTA N/A 09/05/2018   Procedure: SUPRACORONARY STRAIGHT GRAFT REPLACEMENT OF ASCENDING AORTA;  Surgeon: Rexene Alberts, MD;  Location: [place];  Service: Open Heart Surgery;  Laterality: N/A;  . RIGHT/LEFT HEART CATH AND CORONARY ANGIOGRAPHY N/A 08/29/2018   Procedure: RIGHT/LEFT HEART CATH AND CORONARY ANGIOGRAPHY;  Surgeon: Lorretta Harp, MD;  Location: [facility];  Service: Cardiovascular;  Laterality: N/A;  . TEE WITHOUT CARDIOVERSION N/A 08/29/2018   Procedure: TRANSESOPHAGEAL ECHOCARDIOGRAM (TEE);  Surgeon: Skeet Latch, MD;  Location: [place];  Service: Cardiovascular;  Laterality: N/A;  . TRICUSPID VALVE REPLACEMENT N/A 09/05/2018   Procedure: TRICUSPID VALVE REPAIR USING EDWARDS MC3 TRICUSPID ANNULOPLASTY RING SIZE T28;  Surgeon: Rexene Alberts, MD;  Location: [place];  Service: Open Heart Surgery;  Laterality: N/A;  . TUBAL LIGATION      Current Medications: Outpatient Medications Prior to Visit  Medication Sig Dispense Refill  . acetaminophen (TYLENOL) 325 MG tablet Take 650 mg by mouth every 6 (six) hours as needed for fever.    [name] amLODipine (NORVASC) 5 MG tablet Take 1 tablet (5 mg  total) by mouth daily. 30 tablet 0  . losartan (COZAAR) 50 MG tablet Take 1 tablet (50 mg total) by mouth daily. 90 tablet 3  . metoprolol tartrate (LOPRESSOR) 50 MG tablet Take 1 tablet (50 mg total) by mouth 2 (two) times daily. 180 tablet 3  . pantoprazole (PROTONIX) 40 MG tablet Take 1 tablet (40 mg total) by mouth daily before breakfast. 30 tablet 11  . rosuvastatin (CRESTOR) 5 MG tablet TAKE 1 TABLET BY MOUTH ON MONDAY, WEDNESDAY AND FRIDAY 36 tablet 1  . valACYclovir (VALTREX) 1000 MG tablet 2 po twice a day for one day as needed for fever blisters 28 tablet 6  . warfarin (COUMADIN) 2.5 MG tablet Take 2 tablets daily except 1 1/2 tablets  on Sundays, Tuesdays and Thursdays 180 tablet 3   No facility-administered medications prior to visit.     Allergies:   Bee venom, Boniva [ibandronic acid], Lisinopril, and Latex   Social History   Socioeconomic History  . Marital status: Married    Spouse name: Not on file  . Number of children: Not on file  . Years of education: Not on file  . Highest education level: Not on file  Occupational History  . Not on file  Tobacco Use  . Smoking status: Former Smoker    Packs/day: 0.50    Years: 10.00    Pack years: 5.00    Types: Cigarettes  . Smokeless tobacco: Never Used  Vaping Use  . Vaping Use: Never used  Substance and Sexual Activity  . Alcohol use: No  . Drug use: No  . Sexual activity: Yes  Other Topics Concern  . Not on file  Social History Narrative  . Not on file   Social Determinants of Health   Financial Resource Strain:   . Difficulty of Paying Living Expenses:   Food Insecurity:   . Worried About Charity fundraiser in the Last Year:   . Arboriculturist in the Last Year:   Transportation Needs:   . Film/video editor (Medical):   [name] Lack of Transportation (Non-Medical):   Physical Activity:   . Days of Exercise per Week:   . Minutes of Exercise per Session:   Stress:   . Feeling of Stress :   Social Connections:   . Frequency of Communication with Friends and Family:   . Frequency of Social Gatherings with Friends and Family:   . Attends Religious Services:   . Active Member of Clubs or Organizations:   . Attends Archivist Meetings:   [name] Marital Status:      Family History:  The patient's family history includes Colon cancer in her daughter.   Review of Systems:   Please see the history of present illness.     General:  No chills, fever, night sweats or weight changes. Positive for fatigue.  Cardiovascular:  No chest pain, dyspnea on exertion, edema, orthopnea, palpitations, paroxysmal nocturnal dyspnea. Dermatological: No  rash, lesions/masses Respiratory: No cough, dyspnea Urologic: No hematuria, dysuria Abdominal:   No nausea, vomiting, diarrhea, bright red blood per rectum, melena, or hematemesis Neurologic:  No visual changes, wkns, changes in mental status. All other systems reviewed and are otherwise negative except as noted above.   Physical Exam:    VS:  BP 118/70   Pulse (!) 56   Ht 5\' 1"  (1.549 m)   Wt 124 lb (56.2 kg)   SpO2 98%   BMI 23.43 kg/m  General: Well developed, well nourished,female appearing in no acute distress. Head: Normocephalic, atraumatic, sclera non-icteric.  Neck: No carotid bruits. JVD not elevated.  Lungs: Respirations regular and unlabored, without wheezes or rales.  Heart: Irregularly irregular. No S3 or S4.  2/6 SEM along RUSB.  Abdomen: Soft, non-tender, non-distended. No obvious abdominal masses. Msk:  Strength and tone appear normal for age. No obvious joint deformities or effusions. Extremities: No clubbing or cyanosis. No lower extremity edema.  Distal pedal pulses are 2+ bilaterally. Neuro: Alert and oriented X 3. Moves all extremities spontaneously. No focal deficits noted. Psych:  Responds to questions appropriately with a normal affect. Skin: No rashes or lesions noted  Wt Readings from Last 3 Encounters:  10/15/19 124 lb (56.2 kg)  10/01/19 120 lb (54.4 kg)  09/15/19 122 lb (55.3 kg)     Studies/Labs Reviewed:   EKG:  EKG is not ordered today.  EKG from 10/01/2019 is reviewed and shows rate-controlled atrial flutter, HR 93 with known RBBB.   Recent Labs: 06/26/2019: ALT 16 07/03/2019: TSH 3.450 10/01/2019: BUN 13; Creatinine, Ser 0.71; Hemoglobin 12.2; Platelets 172; Potassium 3.8; Sodium 140   Lipid Panel    Component Value Date/Time   CHOL 152 07/03/2019 1108   TRIG 102 07/03/2019 1108   HDL 53 07/03/2019 1108   CHOLHDL 2.9 07/03/2019 1108   CHOLHDL 3.4 05/28/2014 1007   VLDL 12 05/28/2014 1007   LDLCALC 80 07/03/2019 1108     Additional studies/ records that were reviewed today include:   Cardiac Catheterization: 08/2019  Prox LAD lesion is 40% stenosed.  Mid Cx lesion is 40% stenosed.  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   IMPRESSION: Ms Templeman has noncritical CAD with a high V wave and filling pressures and at least a 9 to 10 mm transmitral gradient.  The Swan-Ganz catheter was removed.  The radial sheath was removed and a TR band was placed on the right wrist to achieve patent hemostasis.  The patient left lab stable condition.  She can be discharged home today as an outpatient and restart Eliquis oral anticoagulation.  We will arrange outpatient follow-up and evaluation with Dr. Roxy Manns to determine suitability for multivalve replacement.   Echocardiogram: 06/2019 IMPRESSIONS    1. Left ventricular ejection fraction, by estimation, is 55 to 60%. The  left ventricle has normal function. The left ventricle has no regional  wall motion abnormalities. There is mild concentric left ventricular  hypertrophy. Left ventricular diastolic  parameters are indeterminate.  2. Right ventricular systolic function is mildly reduced. The right  ventricular size is normal. There is normal pulmonary artery systolic  pressure.  3. Left atrial size was severely dilated.  4. Right atrial size was severely dilated.  5. MAGNA MITRAL EASE 29MM BIOPROSTHESIS VALVE is in the MV position. The  mitral valve has been repaired/replaced. No evidence of mitral valve  regurgitation.  6. EDWARDS MC3 TRICUSPID ANNULOPLASTY RING SIZE T28 is present. The  tricuspid valve is has been repaired/replaced. Tricuspid valve  regurgitation is moderate.  7. MAGNA EASE 21MM AOTIC BIOPROSTHESIc VALVE is in the AV position. The  aortic valve has been repaired/replaced. Aortic valve regurgitation is  mild.  8. The inferior vena cava is normal in size with greater than 50%  respiratory  variability, suggesting right atrial pressure of 3 mmHg.   Assessment:    1. Valvular heart disease   2. Atrial flutter, unspecified type ([place])   3. Coronary artery disease  involving native coronary artery of native heart without angina pectoris   4. Essential hypertension      Plan:   In order of problems listed above:  1. 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 Echocardiogram in 06/2019 showed normal valve function. Will continue to follow with routine echocardiogram imaging.   2. Persistent Atrial Flutter - Rates are in 50's during today's visit but have been elevated in the 90's to 110's at times when checked at home. Remains on Lopressor 50mg  BID.  - She has scheduled follow-up with the [facility] tomorrow to discuss DCCV vs. Ablation as previously recommended by Dr. Roxy Manns as she is s/p Maze procedure at the time of her surgery in 08/2018. Remains on Coumadin for anticoagulation.   3. CAD - Prior catheterization in 08/2018 showed mild CAD as outlined above. She remains on Crestor 5mg  MWF and BB therapy. Not on ASA given the need for anticoagulation.   4. HTN - BP is well-controlled at 118/70 during today's visit and has been variable when checked at home with SBP typically in the 120's to 130's in the mornings and drops into the low 100's to 110's in the afternoon. I recommended she continue her current regimen for now. If she develops hypotension, would reduce Amlodipine from 5mg  daily to 2.5mg  daily. Continue Lopressor 50mg  BID and Losartan 50mg  daily.    Medication Adjustments/Labs and Tests Ordered: Current medicines are reviewed at length with the patient today.  Concerns regarding medicines are outlined above.   Medication changes, Labs and Tests ordered today are listed in the Patient Instructions below. Patient Instructions  Medication Instructions:  Your physician recommends that you continue on your current medications as directed. Please refer to the Current Medication list given to you today.  *If you need a refill on your cardiac medications before your next appointment, please call your pharmacy*   Lab Work: NONE  If you have labs (blood work) drawn today and your tests are completely normal, you will receive your results only by: [name] MyChart Message (if you have MyChart) OR . A paper copy in the mail If you have any lab test that is abnormal or we need to change your treatment, we will call you to review the results.   Testing/Procedures: NONE   Follow-Up: At [facility], you and your health needs are our priority.  As part of our continuing mission to provide you with exceptional heart care, we have created designated Provider Care Teams.  These Care Teams include your primary Cardiologist (physician) and Advanced Practice Providers (APPs -  Physician Assistants and Nurse Practitioners) who all work together to provide you with the care you need, when you need it.  We recommend signing up for the patient portal called "MyChart".  Sign up information is provided on this After Visit Summary.  MyChart is used to connect with patients for Virtual Visits (Telemedicine).  Patients are able to view lab/test results, encounter notes, upcoming appointments, etc.  Non-urgent messages can be sent to your provider as well.   To learn more about what you can do with MyChart, go to [email or web address].    Your next appointment:   2 month(s)  The format for your next appointment:   In Person  Provider:   Rozann Lesches, MD  Other Instructions Thank you for choosing [facility]!       Signed, Erma Heritage, PA-C  10/15/2019 8:44 PM    [facility] [place], [place] [address] Phone: [phone] Fax: [phone]

## 2019-10-15 NOTE — Patient Instructions (Signed)
Medication Instructions:  Your physician recommends that you continue on your current medications as directed. Please refer to the Current Medication list given to you today.  *If you need a refill on your cardiac medications before your next appointment, please call your pharmacy*   Lab Work: NONE  If you have labs (blood work) drawn today and your tests are completely normal, you will receive your results only by: [name] MyChart Message (if you have MyChart) OR . A paper copy in the mail If you have any lab test that is abnormal or we need to change your treatment, we will call you to review the results.   Testing/Procedures: NONE   Follow-Up: At [facility], you and your health needs are our priority.  As part of our continuing mission to provide you with exceptional heart care, we have created designated Provider Care Teams.  These Care Teams include your primary Cardiologist (physician) and Advanced Practice Providers (APPs -  Physician Assistants and Nurse Practitioners) who all work together to provide you with the care you need, when you need it.  We recommend signing up for the patient portal called "MyChart".  Sign up information is provided on this After Visit Summary.  MyChart is used to connect with patients for Virtual Visits (Telemedicine).  Patients are able to view lab/test results, encounter notes, upcoming appointments, etc.  Non-urgent messages can be sent to your provider as well.   To learn more about what you can do with MyChart, go to [email or web address].    Your next appointment:   2 month(s)  The format for your next appointment:   In Person  Provider:   Rozann Lesches, MD   Other Instructions Thank you for choosing [facility]!

## 2019-10-16 ENCOUNTER — Ambulatory Visit (HOSPITAL_COMMUNITY)
Admission: RE | Admit: 2019-10-16 | Discharge: 2019-10-16 | Disposition: A | Discharge status: Home / Self Care | Payer: Medicare Other | Source: Ambulatory Visit | Attending: Nurse Practitioner | Admitting: Nurse Practitioner

## 2019-10-16 ENCOUNTER — Other Ambulatory Visit: Payer: Self-pay

## 2019-10-16 VITALS — BP 140/76 | HR 54 | Ht 61.0 in | Wt 124.0 lb

## 2019-10-16 DIAGNOSIS — Z87891 Personal history of nicotine dependence: Secondary | ICD-10-CM | POA: Insufficient documentation

## 2019-10-16 DIAGNOSIS — I2510 Atherosclerotic heart disease of native coronary artery without angina pectoris: Secondary | ICD-10-CM | POA: Insufficient documentation

## 2019-10-16 DIAGNOSIS — I712 Thoracic aortic aneurysm, without rupture: Secondary | ICD-10-CM | POA: Diagnosis not present

## 2019-10-16 DIAGNOSIS — I480 Paroxysmal atrial fibrillation: Secondary | ICD-10-CM | POA: Diagnosis present

## 2019-10-16 DIAGNOSIS — I4892 Unspecified atrial flutter: Secondary | ICD-10-CM

## 2019-10-16 DIAGNOSIS — I099 Rheumatic heart disease, unspecified: Secondary | ICD-10-CM | POA: Diagnosis not present

## 2019-10-16 DIAGNOSIS — E039 Hypothyroidism, unspecified: Secondary | ICD-10-CM | POA: Diagnosis not present

## 2019-10-16 DIAGNOSIS — Z952 Presence of prosthetic heart valve: Secondary | ICD-10-CM | POA: Diagnosis not present

## 2019-10-16 DIAGNOSIS — Z7901 Long term (current) use of anticoagulants: Secondary | ICD-10-CM | POA: Diagnosis not present

## 2019-10-16 DIAGNOSIS — Z79899 Other long term (current) drug therapy: Secondary | ICD-10-CM | POA: Diagnosis not present

## 2019-10-16 DIAGNOSIS — D6869 Other thrombophilia: Secondary | ICD-10-CM

## 2019-10-16 DIAGNOSIS — I4819 Other persistent atrial fibrillation: Secondary | ICD-10-CM

## 2019-10-16 DIAGNOSIS — I700 Atherosclerosis of aorta: Secondary | ICD-10-CM | POA: Diagnosis not present

## 2019-10-16 DIAGNOSIS — I119 Hypertensive heart disease without heart failure: Secondary | ICD-10-CM | POA: Insufficient documentation

## 2019-10-16 NOTE — Progress Notes (Signed)
Electrophysiology  Note   Date:  10/16/2019   ID:  Krimson, Massmann Oct 02, 1940, MRN [id number]   Provider location: [place], [place] [address] Evaluation Performed:f/u   PCP:  Kathyrn Drown, MD  Primary Cardiologist:  Dr. [name]  Primary Electrophysiologist:  none  CC: afib     History of Present Illness: Hannah Roy is a 79 y.o. female wth h/o HTN, rheumatic heart disease, mild CAD, hypothyroidism, who presents for f/u in afib clinic for increased afib/flutter burden following valve surgery last year with MAZE by Dr. Roxy Manns.   She has a h/o of paroxysmal afib first found in a ER visit in 05/10/18.   She was admitted overnight, returned to SR  with  DILT drip and started on  eliquis, CHA2DS2VASc score of 4.  Her echo showed EF of 60-65%, severley dilated left and rt atrial size with myxomatous mitral valve, moderate basal septal hypertrophy, moderate tricuspid regurgitation and moderate to severe aortic regurgitation.  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   She saw Dr. Roxy Manns, 09/15/19 and found to be in atypical atrial flutter and referred here for consideration of cardioversion vrs ablation. Pt is in SR today but had a ER visit 6/23 with atrial flutter and poorly controlled  HTN. meds were adjusted,  BP is stable today. She has increased fatigue during and after arrhythmia spells. Has had covid shots.  Today, she denies symptoms of orthopnea, PND, lower extremity edema, claudication,  presyncope, syncope, bleeding, or neurologic sequela. The patient is tolerating medications without difficulties and is otherwise without complaint today.   she denies symptoms of cough, fevers, chills, or  new SOB worrisome for COVID 19.    Atrial Fibrillation Risk Factors:  she does not have symptoms or diagnosis of sleep apnea. she does have a history of rheumatic fever. she does not have a history of alcohol use. The patient does not have a history of early familial atrial fibrillation or other arrhythmias.  she has a BMI of Body mass index is 23.43 kg/m.[name] Filed Weights   10/16/19 0903  Weight: 56.2 kg    Past Medical History:  Diagnosis Date  . Aortic atherosclerosis ([place])   . Aortic insufficiency, rheumatic   . 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 Atrial fibrillation ([place]) 05/10/2018  . Colon polyps   . Essential hypertension, benign   . History of seasonal allergies   . Hypertension   . Hypothyroidism   . Mitral stenosis with regurgitation, rheumatic   . Osteopenia 2006  . Persistent atrial fibrillation ([place])   . 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   21 mm [facility] Ease stented bovine pericardial tissue valve  . S/P ascending aortic aneurysm repair 09/05/2018   28 mm Hemashield platinum supracoronary straight graft  . S/P Maze operation for atrial fibrillation 09/05/2018   Complete bilateral atrial lesion set using bipolar radiofrequency and cryothermy ablation with clipping of LA appendage  . 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   29 mm [facility] Mitral stented bovine pericardial tissue valve  . 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 Tricuspid regurgitation    Past Surgical History:  Procedure Laterality Date  .  AORTIC VALVE REPLACEMENT N/A 09/05/2018   Procedure: AORTIC VALVE REPLACEMENT (AVR) USING MAGNA EASE 21MM AOTIC BIOPROSTHESIS VALVE;  Surgeon: Rexene Alberts, MD;  Location: [place];  Service: Open Heart Surgery;  Laterality: N/A;  . APPENDECTOMY    . CLIPPING OF ATRIAL APPENDAGE  09/05/2018   Procedure: Clipping Of Atrial Appendage;  Surgeon: Rexene Alberts, MD;  Location: [place];  Service: Open Heart Surgery;;  . COLONOSCOPY  03/23/2011   repeat in 5 years,Procedure: COLONOSCOPY;  Surgeon: Rogene Houston, MD;  [facility];  Service: Endoscopy;  Laterality: N/A;  1:00  . COLONOSCOPY N/A 11/09/2016   Procedure: COLONOSCOPY;  Surgeon: Rogene Houston, MD;  [facility];  Service: Endoscopy;  Laterality: N/A;  1030  . ESOPHAGOGASTRODUODENOSCOPY N/A 10/10/2018   Procedure: ESOPHAGOGASTRODUODENOSCOPY (EGD);  Surgeon: Doran Stabler, MD;  Location: [place];  Service: Gastroenterology;  Laterality: N/A;  . ESOPHAGOGASTRODUODENOSCOPY (EGD) WITH PROPOFOL N/A 10/08/2018   Procedure: ESOPHAGOGASTRODUODENOSCOPY (EGD) WITH PROPOFOL;  Surgeon: Danie Binder, MD;  [facility];  Service: Endoscopy;  Laterality: N/A;  . ESOPHAGOGASTRODUODENOSCOPY (EGD) WITH PROPOFOL N/A 11/15/2018   Procedure: ESOPHAGOGASTRODUODENOSCOPY (EGD) WITH PROPOFOL;  Surgeon: Rogene Houston, MD;  [facility];  Service: Endoscopy;  Laterality: N/A;  12:55  . HOT HEMOSTASIS N/A 10/10/2018   Procedure: HOT HEMOSTASIS (ARGON PLASMA COAGULATION/BICAP);  Surgeon: Doran Stabler, MD;  Location: [place];  Service: Gastroenterology;  Laterality: N/A;  . IR ANGIOGRAM SELECTIVE EACH ADDITIONAL VESSEL  10/08/2018  . IR ANGIOGRAM VISCERAL SELECTIVE  10/08/2018  . IR EMBO ART  VEN HEMORR LYMPH EXTRAV  INC GUIDE ROADMAPPING  10/08/2018  . IR US GUIDE VASC ACCESS RIGHT  10/08/2018  . MAZE N/A 09/05/2018   Procedure: MAZE;  Surgeon: Rexene Alberts, MD;  Location: [place];  Service: Open Heart Surgery;  Laterality: N/A;  . MITRAL VALVE REPLACEMENT N/A 09/05/2018   Procedure: MITRAL VALVE (MV) REPLACEMENT USING MAGNA MITRAL EASE 29MM BIOPROSTHESIS VALVE;  Surgeon: Rexene Alberts, MD;  Location: [place];  Service: Open Heart Surgery;  Laterality: N/A;  . REPLACEMENT ASCENDING AORTA N/A 09/05/2018   Procedure: SUPRACORONARY STRAIGHT GRAFT REPLACEMENT OF  ASCENDING AORTA;  Surgeon: Rexene Alberts, MD;  Location: [place];  Service: Open Heart Surgery;  Laterality: N/A;  . RIGHT/LEFT HEART CATH AND CORONARY ANGIOGRAPHY N/A 08/29/2018   Procedure: RIGHT/LEFT HEART CATH AND CORONARY ANGIOGRAPHY;  Surgeon: Lorretta Harp, MD;  Location: [place] [facility];  Service: Cardiovascular;  Laterality: N/A;  . TEE WITHOUT CARDIOVERSION N/A 08/29/2018   Procedure: TRANSESOPHAGEAL ECHOCARDIOGRAM (TEE);  Surgeon: Skeet Latch, MD;  Location: [place];  Service: Cardiovascular;  Laterality: N/A;  . TRICUSPID VALVE REPLACEMENT N/A 09/05/2018   Procedure: TRICUSPID VALVE REPAIR USING EDWARDS MC3 TRICUSPID ANNULOPLASTY RING SIZE T28;  Surgeon: Rexene Alberts, MD;  Location: [place];  Service: Open Heart Surgery;  Laterality: N/A;  . TUBAL LIGATION       Current Outpatient Medications  Medication Sig Dispense Refill  . acetaminophen (TYLENOL) 325 MG tablet Take 650 mg by mouth every 6 (six) hours as needed for fever.    [name] amLODipine (NORVASC) 5 MG tablet Take 1 tablet (5 mg total) by mouth daily. 30 tablet 0  . losartan (COZAAR) 50 MG tablet Take 1 tablet (50 mg total) by mouth daily. 90 tablet 3  . metoprolol tartrate (LOPRESSOR) 50 MG tablet Take 1 tablet (50 mg total) by mouth  2 (two) times daily. 180 tablet 3  . pantoprazole (PROTONIX) 40 MG tablet Take 1 tablet (40 mg total) by mouth daily before breakfast. 30 tablet 11  . rosuvastatin (CRESTOR) 5 MG tablet TAKE 1 TABLET BY MOUTH ON MONDAY, WEDNESDAY AND FRIDAY 36 tablet 1  . valACYclovir (VALTREX) 1000 MG tablet 2 po twice a day for one day as needed for fever blisters 28 tablet 6  . warfarin (COUMADIN) 2.5 MG tablet Take 2 tablets daily except 1 1/2 tablets on Sundays, Tuesdays and Thursdays 180 tablet 3   No current facility-administered medications for this encounter.    Allergies:   Bee venom, Boniva [ibandronic acid], Lisinopril, and Latex   Social History:  The patient  reports that she has  quit smoking. Her smoking use included cigarettes. She has a 5.00 pack-year smoking history. She has never used smokeless tobacco. She reports that she does not drink alcohol and does not use drugs.   Family History:  The patient's  family history includes Colon cancer in her daughter.    ROS:  Please see the history of present illness.   All other systems are personally reviewed and negative.   Exam: Well appearing, alert and conversant, regular work of breathing,  good skin color  Recent Labs: 06/26/2019: ALT 16 07/03/2019: TSH 3.450 10/01/2019: BUN 13; Creatinine, Ser 0.71; Hemoglobin 12.2; Platelets 172; Potassium 3.8; Sodium 140  personally reviewed    Other studies personally reviewed: Epic records reviewed  Echo-1. The left ventricle has normal systolic function of 89-16%. The cavity size is normal. There is moderate focal basal septal left ventricular wall thickness. Echo evidence of unable to assess due to arrhythmia (atrial fibrillation and/or flutter)  diastolic filling patterns.  2. Severely dilated left atrial size.  3. Moderately dilated right atrial size.  4. The mitral valve is myxomatous. There is mild thickening. Regurgitation is mild to moderate by color flow Doppler.  5. Normal tricuspid valve.  6. Tricuspid regurgitation moderate.  7. The aortic valve tricuspid. There is moderate thickening of the aortic valve. Aortic valve regurgitation is moderate to severe by color flow Doppler. The calculated aortic valve area is 0.99 cm consistent with mild stenosis.  8. Mild dilatation of the aortic root.  9. The inferior vena cava was dilated in size with >50% respiratory variablity. 10. The patient was tachycardic throughout the study with evidence of atrial fibrillation and/or flutter. 11. No atrial level shunt detected by color flow Doppler.  FINDINGS  Left Ventricle: No evidence of left ventricular regional wall motion abnormalities. The left ventricle has normal  systolic function of 94-50%. The cavity size is normal. There is moderate focal basal septal left ventricular wall thickness. Echo evidence  of unable to assess due to arrhythmia (atrial fibrillation and/or flutter) diastolic filling patterns. Right Ventricle: The right ventricle is normal in size. There is normal hypertrophy. There is normal systolic function. Left Atrium: The left atrium is severely dilated. Right Atrium: The right atrial size is moderately dilated. Interatrial Septum: No atrial level shunt detected by color flow Doppler.  Pericardium: There is no evidence of pericardial effusion. Mitral Valve: The mitral valve is myxomatous. There is mild thickening. Regurgitation is mild to moderate by color flow Doppler. Tricuspid Valve: The tricuspid valve is normal in structure. Tricuspid regurgitation moderate by color flow Doppler. Aortic Valve: The aortic valve tricuspid. There is moderate thickening of the aortic valve. Aortic valve regurgitation is moderate to severe by color flow Doppler. The calculated aortic valve  area is 0.99 cm consistent with mild stenosis. Pulmonic Valve: The pulmonic valve is grossly normal. Pulmonic valve regurgitation is trivial by color flow Doppler. Aorta: There is mild dilatation of the aortic root. Venous: The inferior vena cava was dilated in size with greater than 50% respiratory variablity. In comparison to the previous echocardiogram(s): There are no prior studies on this patient for comparison purposes. The patient was tachycardic throughout the study with evidence of atrial fibrillation and/or flutter.   Echo-3/21- 1. Left ventricular ejection fraction, by estimation, is 55 to 60%. The  left ventricle has normal function. The left ventricle has no regional  wall motion abnormalities. There is mild concentric left ventricular  hypertrophy. Left ventricular diastolic  parameters are indeterminate.  2. Right ventricular systolic function is mildly  reduced. The right  ventricular size is normal. There is normal pulmonary artery systolic  pressure.  3. Left atrial size was severely dilated.  4. Right atrial size was severely dilated.  5. MAGNA MITRAL EASE 29MM BIOPROSTHESIS VALVE is in the MV position. The  mitral valve has been repaired/replaced. No evidence of mitral valve  regurgitation.  6. EDWARDS MC3 TRICUSPID ANNULOPLASTY RING SIZE T28 is present. The  tricuspid valve is has been repaired/replaced. Tricuspid valve  regurgitation is moderate.  7. MAGNA EASE 21MM AOTIC BIOPROSTHESIc VALVE is in the AV position. The  aortic valve has been repaired/replaced. Aortic valve regurgitation is  mild.  8. The inferior vena cava is normal in size with greater than 50%  respiratory variability, suggesting right atrial pressure of 3 mmHg.      ASSESSMENT AND PLAN:  1. Paroxysmal afib/atrial flutter, increased burden  S/p Maze 08/2018 Triggers discussed and none identified  Denies any excessive caffeine, snoring, alcohol use,tobacco use I think her options are ablation vrs tikosyn Will refer to EP to further discuss  Continue metoprolol 50 mg bid  continue warfarin   This patients CHA2DS2-VASc Score and unadjusted Ischemic Stroke Rate (% per year) is equal to 4.8 % stroke rate/year from a score of 4  Above score calculated as 1 point each if present [CHF, HTN, DM, Vascular=MI/PAD/Aortic Plaque, Age if 65-74, or Female] Above score calculated as 2 points each if present [Age > 75, or Stroke/TIA/TE]  2.HTN Stable today   3. S/p bioprosthetic aortic valve, mitral valve replacement with tricuspid valve repair with ring annuloplasty with  MAZE and repair of ascending thoracic  aneurysm 08/2018  Per Dr. Roxy Manns     Labs/ tests ordered today include: none Orders Placed This Encounter  Procedures  . Ambulatory referral to Cardiac Electrophysiology  . EKG 12-Lead     Signed, Hannah Palau NP 10/16/2019 9:57 AM  Afib  [facility] [address] [place], [place] [address] [phone]

## 2019-10-17 ENCOUNTER — Ambulatory Visit: Payer: Medicare Other | Admitting: Cardiovascular Disease

## 2019-10-27 ENCOUNTER — Other Ambulatory Visit: Payer: Self-pay

## 2019-10-27 ENCOUNTER — Encounter: Payer: Self-pay | Admitting: Internal Medicine

## 2019-10-27 ENCOUNTER — Other Ambulatory Visit (INDEPENDENT_AMBULATORY_CARE_PROVIDER_SITE_OTHER): Payer: Self-pay | Admitting: Internal Medicine

## 2019-10-27 ENCOUNTER — Ambulatory Visit (INDEPENDENT_AMBULATORY_CARE_PROVIDER_SITE_OTHER): Payer: Medicare Other | Admitting: Internal Medicine

## 2019-10-27 VITALS — BP 130/80 | HR 51 | Ht 61.0 in | Wt 124.4 lb

## 2019-10-27 DIAGNOSIS — I10 Essential (primary) hypertension: Secondary | ICD-10-CM

## 2019-10-27 DIAGNOSIS — Z953 Presence of xenogenic heart valve: Secondary | ICD-10-CM

## 2019-10-27 DIAGNOSIS — I4819 Other persistent atrial fibrillation: Secondary | ICD-10-CM

## 2019-10-27 DIAGNOSIS — I4892 Unspecified atrial flutter: Secondary | ICD-10-CM | POA: Diagnosis not present

## 2019-10-27 DIAGNOSIS — D6869 Other thrombophilia: Secondary | ICD-10-CM | POA: Diagnosis not present

## 2019-10-27 MED ORDER — AMLODIPINE BESYLATE 2.5 MG PO TABS
2.5000 mg | ORAL_TABLET | Freq: Every day | ORAL | 3 refills | Status: DC
Start: 2019-10-27 — End: 2020-03-23

## 2019-10-27 NOTE — Patient Instructions (Addendum)
Medication Instructions:  Your physician has recommended you make the following change in your medication:  1 Reduce your Amlopipine Besylate to 2.5mg . Take 1 tablet by mouth daily. *If you need a refill on your cardiac medications before your next appointment, please call your pharmacy*  Lab Work: None ordered.  If you have labs (blood work) drawn today and your tests are completely normal, you will receive your results only by: [name] MyChart Message (if you have MyChart) OR . A paper copy in the mail If you have any lab test that is abnormal or we need to change your treatment, we will call you to review the results.  Testing/Procedures: None ordered.  Follow-Up: At [facility], you and your health needs are our priority.  As part of our continuing mission to provide you with exceptional heart care, we have created designated Provider Care Teams.  These Care Teams include your primary Cardiologist (physician) and Advanced Practice Providers (APPs -  Physician Assistants and Nurse Practitioners) who all work together to provide you with the care you need, when you need it.  We recommend signing up for the patient portal called "MyChart".  Sign up information is provided on this After Visit Summary.  MyChart is used to connect with patients for Virtual Visits (Telemedicine).  Patients are able to view lab/test results, encounter notes, upcoming appointments, etc.  Non-urgent messages can be sent to your provider as well.   To learn more about what you can do with MyChart, go to [email or web address].    Your next appointment:   Your physician wants you to follow-up in: 3 months Afib Clinic. You will receive a reminder letter in the mail two months in advance. If you don't receive a letter, please call our office to schedule the follow-up appointment.    Other Instructions:

## 2019-10-27 NOTE — Progress Notes (Signed)
Electrophysiology Office Note   Date:  10/27/2019   ID:  GARA KINCADE, DOB 29-May-1940, MRN [id number]  PCP:  Hannah Drown, MD  Cardiologist:  Previously Dr [name] Primary Electrophysiologist: Thompson Grayer, MD    CC: afib   History of Present Illness: Hannah Roy is a 79 y.o. female who presents today for electrophysiology evaluation.   She is referred by Roderic Palau and Dr [name] for evaluation of afib.  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   She is s/p MVR/AVR surgery with MAZE by Dr Roxy Manns.  She has severe biatrial enlargement.  She has had atypical atrial flutter and afib subsequently.  This was very pronounced after her second covid vaccine but has substantially improved over the past month, especially with treatment of her HTN. No recent palpitations.  Today, she denies symptoms of palpitations, chest pain, shortness of breath, orthopnea, PND, lower extremity edema, claudication, dizziness, presyncope, syncope, bleeding, or neurologic sequela. The patient is tolerating medications without difficulties and is otherwise without complaint today.    Past Medical History:  Diagnosis Date  . Aortic atherosclerosis ([place])   . Aortic insufficiency, rheumatic   . 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 Atrial fibrillation ([place]) 05/10/2018  . Colon polyps   . Essential hypertension, benign   . History of seasonal allergies   . Hypertension   . Hypothyroidism   . Mitral stenosis with regurgitation, rheumatic   . Osteopenia 2006  . Persistent atrial fibrillation ([place])   . 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   21 mm [facility] Ease stented bovine pericardial tissue valve  . S/P ascending aortic aneurysm repair 09/05/2018   28 mm Hemashield platinum supracoronary straight graft  . S/P Maze operation for atrial fibrillation 09/05/2018   Complete bilateral atrial lesion set using bipolar  radiofrequency and cryothermy ablation with clipping of LA appendage  . 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   29 mm [facility] Mitral stented bovine pericardial tissue valve  . 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 Tricuspid regurgitation    Past Surgical History:  Procedure Laterality Date  . AORTIC VALVE REPLACEMENT N/A 09/05/2018   Procedure: AORTIC VALVE REPLACEMENT (AVR) USING MAGNA EASE 21MM AOTIC BIOPROSTHESIS VALVE;  Surgeon: Rexene Alberts, MD;  Location: [place];  Service: Open Heart Surgery;  Laterality: N/A;  . APPENDECTOMY    . CLIPPING OF ATRIAL APPENDAGE  09/05/2018   Procedure: Clipping Of Atrial Appendage;  Surgeon: Rexene Alberts, MD;  Location: [place];  Service: Open Heart Surgery;;  . COLONOSCOPY  03/23/2011   repeat in 5 years,Procedure: COLONOSCOPY;  Surgeon: Rogene Houston, MD;  [facility];  Service: Endoscopy;  Laterality: N/A;  1:00  . COLONOSCOPY N/A 11/09/2016   Procedure: COLONOSCOPY;  Surgeon: Rogene Houston, MD;  [facility];  Service: Endoscopy;  Laterality: N/A;  1030  . ESOPHAGOGASTRODUODENOSCOPY N/A 10/10/2018   Procedure: ESOPHAGOGASTRODUODENOSCOPY (EGD);  Surgeon: Doran Stabler, MD;  Location: [place];  Service: Gastroenterology;  Laterality: N/A;  . ESOPHAGOGASTRODUODENOSCOPY (EGD) WITH PROPOFOL N/A 10/08/2018   Procedure: ESOPHAGOGASTRODUODENOSCOPY (EGD) WITH PROPOFOL;  Surgeon: Danie Binder, MD;  [facility];  Service: Endoscopy;  Laterality: N/A;  . ESOPHAGOGASTRODUODENOSCOPY (EGD) WITH PROPOFOL N/A 11/15/2018   Procedure: ESOPHAGOGASTRODUODENOSCOPY (EGD) WITH PROPOFOL;  Surgeon: Rogene Houston, MD;  [facility];  Service: Endoscopy;  Laterality: N/A;  12:55  . HOT HEMOSTASIS N/A 10/10/2018   Procedure: HOT HEMOSTASIS (ARGON PLASMA COAGULATION/BICAP);  Surgeon: Doran Stabler, MD;  Location: [place];  Service:  Gastroenterology;  Laterality: N/A;  . IR ANGIOGRAM SELECTIVE EACH ADDITIONAL VESSEL  10/08/2018  . IR ANGIOGRAM VISCERAL SELECTIVE  10/08/2018  . IR EMBO ART  VEN HEMORR LYMPH EXTRAV  INC GUIDE ROADMAPPING  10/08/2018  . IR US GUIDE VASC ACCESS RIGHT  10/08/2018  . MAZE N/A 09/05/2018   Procedure: MAZE;  Surgeon: Rexene Alberts, MD;  Location: [place];  Service: Open Heart Surgery;  Laterality: N/A;  . MITRAL VALVE REPLACEMENT N/A 09/05/2018   Procedure: MITRAL VALVE (MV) REPLACEMENT USING MAGNA MITRAL EASE 29MM BIOPROSTHESIS VALVE;  Surgeon: Rexene Alberts, MD;  Location: [place];  Service: Open Heart Surgery;  Laterality: N/A;  . REPLACEMENT ASCENDING AORTA N/A 09/05/2018   Procedure: SUPRACORONARY STRAIGHT GRAFT REPLACEMENT OF ASCENDING AORTA;  Surgeon: Rexene Alberts, MD;  Location: [place];  Service: Open Heart Surgery;  Laterality: N/A;  . RIGHT/LEFT HEART CATH AND CORONARY ANGIOGRAPHY N/A 08/29/2018   Procedure: RIGHT/LEFT HEART CATH AND CORONARY ANGIOGRAPHY;  Surgeon: Lorretta Harp, MD;  Location: [place] CV LAB;  Service: Cardiovascular;  Laterality: N/A;  . TEE WITHOUT CARDIOVERSION N/A 08/29/2018   Procedure: TRANSESOPHAGEAL ECHOCARDIOGRAM (TEE);  Surgeon: Skeet Latch, MD;  Location: [place];  Service: Cardiovascular;  Laterality: N/A;  . TRICUSPID VALVE REPLACEMENT N/A 09/05/2018   Procedure: TRICUSPID VALVE REPAIR USING EDWARDS MC3 TRICUSPID ANNULOPLASTY RING SIZE T28;  Surgeon: Rexene Alberts, MD;  Location: [place];  Service: Open Heart Surgery;  Laterality: N/A;  . TUBAL LIGATION       Current Outpatient Medications  Medication Sig Dispense Refill  . acetaminophen (TYLENOL) 325 MG tablet Take 650 mg by mouth every 6 (six) hours as needed for fever.    [name] amLODipine (NORVASC) 5 MG tablet Take 1 tablet (5 mg total) by mouth daily. 30 tablet 0  . losartan (COZAAR) 50 MG tablet Take 1 tablet (50 mg total) by mouth daily. 90 tablet 3  . pantoprazole (PROTONIX) 40 MG  tablet TAKE ONE TABLET BY MOUTH TWICE A DAY 60 tablet 3  . rosuvastatin (CRESTOR) 5 MG tablet TAKE 1 TABLET BY MOUTH ON MONDAY, WEDNESDAY AND FRIDAY 36 tablet 1  . valACYclovir (VALTREX) 1000 MG tablet 2 po twice a day for one day as needed for fever blisters 28 tablet 6  . warfarin (COUMADIN) 2.5 MG tablet Take 2 tablets daily except 1 1/2 tablets on Sundays, Tuesdays and Thursdays 180 tablet 3  . metoprolol tartrate (LOPRESSOR) 50 MG tablet Take 1 tablet (50 mg total) by mouth 2 (two) times daily. 180 tablet 3   No current facility-administered medications for this visit.    Allergies:   Bee venom, Boniva [ibandronic acid], Lisinopril, and Latex   Social History:  The patient  reports that she has quit smoking. Her smoking use included cigarettes. She has a 5.00 pack-year smoking history. She has never used smokeless tobacco. She reports that she does not drink alcohol and does not use drugs.   Family History:  The patient's family history includes Colon cancer in her daughter.    ROS:  Please see the history of present illness.   All other systems are personally reviewed and negative.    PHYSICAL EXAM: VS:  BP 130/80   Pulse (!) 51   Ht 5'  1" (1.549 m)   Wt 124 lb 6.4 oz (56.4 kg)   SpO2 98%   BMI 23.51 kg/m  , BMI Body mass index is 23.51 kg/m. GEN: Well nourished, well developed, in no acute distress HEENT: normal Neck: no JVD, carotid bruits, or masses Cardiac: RRR; 2/6 diastolic murmuc LSB Respiratory:  clear to auscultation bilaterally, normal work of breathing GI: soft, nontender, nondistended, + BS MS: no deformity or atrophy Skin: warm and dry  Neuro:  Strength and sensation are intact Psych: euthymic mood, full affect  EKG:  EKG is ordered today. The ekg ordered today is personally reviewed and shows sinus rhythm 51 bpm, PR 202 msec, QRS 142 msec, Qtc 475 msec   Recent Labs: 06/26/2019: ALT 16 07/03/2019: TSH 3.450 10/01/2019: BUN 13; Creatinine, Ser 0.71;  Hemoglobin 12.2; Platelets 172; Potassium 3.8; Sodium 140  personally reviewed   Lipid Panel     Component Value Date/Time   CHOL 152 07/03/2019 1108   TRIG 102 07/03/2019 1108   HDL 53 07/03/2019 1108   CHOLHDL 2.9 07/03/2019 1108   CHOLHDL 3.4 05/28/2014 1007   VLDL 12 05/28/2014 1007   LDLCALC 80 07/03/2019 1108   personally reviewed   Wt Readings from Last 3 Encounters:  10/27/19 124 lb 6.4 oz (56.4 kg)  10/16/19 124 lb (56.2 kg)  10/15/19 124 lb (56.2 kg)      Other studies personally reviewed: Additional studies/ records that were reviewed today include: AF clinic notes, prior echo  Review of the above records today demonstrates: as above   ASSESSMENT AND PLAN:  1.  Paroxysmal atrial fibrillation/ atypical atrial flutter Reasonably well controlled post MAZE/ valve repair and LAA clip Continue current medical therapy Given severe biatrial enlargement, I would not advise ablation. If her arrhythmias progress, we could consider tikosyn or amiodarone as options. Continue coumadin  2. HTN Recently BP has been low Reduce amlodipine to 2.5mg  daily  3. CAD Mild She is on crestor  Risks, benefits and potential toxicities for medications prescribed and/or refilled reviewed with patient today.    Follow-up:  AF clinic every 3 months and with Dr Domenic Polite I will see as needed    Signed, Thompson Grayer, MD  10/27/2019 11:44 AM     [facility] [place] [id number] [phone] (office) [phone] (fax)

## 2019-11-04 ENCOUNTER — Ambulatory Visit (INDEPENDENT_AMBULATORY_CARE_PROVIDER_SITE_OTHER): Payer: Medicare Other | Admitting: *Deleted

## 2019-11-04 DIAGNOSIS — Z953 Presence of xenogenic heart valve: Secondary | ICD-10-CM | POA: Diagnosis not present

## 2019-11-04 DIAGNOSIS — I4819 Other persistent atrial fibrillation: Secondary | ICD-10-CM | POA: Diagnosis not present

## 2019-11-04 DIAGNOSIS — Z5181 Encounter for therapeutic drug level monitoring: Secondary | ICD-10-CM

## 2019-11-04 LAB — POCT INR: INR: 3 (ref 2.0–3.0)

## 2019-11-04 NOTE — Patient Instructions (Signed)
Continue warfarin 2 tablets daily except 1 1/2 tablets on Sundays, Tuesdays and Thursdays Continue greens/salads Recheck in 4 wks 

## 2019-11-25 ENCOUNTER — Encounter: Payer: Self-pay | Admitting: Cardiology

## 2019-11-25 NOTE — Progress Notes (Signed)
Cardiology Office Note  Date: 11/26/2019   ID: Hannah Roy, DOB 1940-06-12, MRN [id number]  PCP:  Hannah Drown, MD  Cardiologist:  Hannah Lesches, MD Electrophysiologist:  None   Chief Complaint  Patient presents with  . Cardiac follow-up    History of Present Illness: Hannah Roy is a 79 y.o. female former patient of Hannah Roy now presenting to establish follow-up with me.  I reviewed her records and updated the chart.  She presents for a routine visit, overall doing very well.  We went over home blood pressure and heart rate checks, she has been doing this twice daily for several weeks.  Based on heart rates, it does not look like she has had any atrial arrhythmias since July 6.  She also follows in the atrial fibrillation clinic, had a visit with Dr. Rayann Roy in July, I reviewed the note.  She has a history of paroxysmal atrial fibrillation and also atypical atrial flutter.  Status post surgical maze with valve repair and also left atrial appendage clipping.  Discussion of possible antiarrhythmic therapy to include either Tikosyn or amiodarone was had depending on rhythm frequency and symptoms, although she was not felt to be a good candidate for ablation in light of severe left biatrial enlargement.  She states that she has better stamina in the last few weeks, has been walking up to 30 minutes at a time 3 to 4 days a week.  We went over her medications and have decided to continue the current course.  Past Medical History:  Diagnosis Date  . Aortic atherosclerosis ([place] Roy)   . Ascending aorta dilatation (Hannah Roy)   . Atypical atrial flutter ([place])   . Colon polyps   . Essential hypertension   . History of seasonal allergies   . Hypertension   . Hypothyroidism   . Osteopenia 2006  . Persistent atrial fibrillation ([place])   . Rheumatic valvular disease   . 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   21 mm [facility] Ease stented bovine pericardial tissue  valve  . S/P ascending aortic aneurysm repair 09/05/2018   28 mm Hemashield platinum supracoronary straight graft  . S/P Maze operation for atrial fibrillation 09/05/2018   Complete bilateral atrial lesion set using bipolar radiofrequency and cryothermy ablation with clipping of LA appendage  . 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   29 mm [facility] Mitral stented bovine pericardial tissue valve  . 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   28 mm Edwards mc3 ring annuloplasty    Past Surgical History:  Procedure Laterality Date  . AORTIC VALVE REPLACEMENT N/A 09/05/2018   Procedure: AORTIC VALVE REPLACEMENT (AVR) USING MAGNA EASE 21MM AOTIC BIOPROSTHESIS VALVE;  Surgeon: Hannah Alberts, MD;  Location: [place];  Service: Open Heart Surgery;  Laterality: N/A;  . APPENDECTOMY    . CLIPPING OF ATRIAL APPENDAGE  09/05/2018   Procedure: Clipping Of Atrial Appendage;  Surgeon: Hannah Alberts, MD;  Location: [place];  Service: Open Heart Surgery;;  . COLONOSCOPY  03/23/2011   repeat in 5 years,Procedure: COLONOSCOPY;  Surgeon: Rogene Houston, MD;  [facility];  Service: Endoscopy;  Laterality: N/A;  1:00  . COLONOSCOPY N/A 11/09/2016   Procedure: COLONOSCOPY;  Surgeon: Rogene Houston, MD;  [facility];  Service: Endoscopy;  Laterality: N/A;  1030  . ESOPHAGOGASTRODUODENOSCOPY N/A 10/10/2018   Procedure: ESOPHAGOGASTRODUODENOSCOPY (EGD);  Surgeon: Doran Stabler, MD;  Location: [place] [facility];  Service: Gastroenterology;  Laterality: N/A;  . ESOPHAGOGASTRODUODENOSCOPY (EGD) WITH PROPOFOL N/A 10/08/2018   Procedure: ESOPHAGOGASTRODUODENOSCOPY (EGD) WITH PROPOFOL;  Surgeon: Danie Binder, MD;  [facility];  Service: Endoscopy;  Laterality: N/A;  . ESOPHAGOGASTRODUODENOSCOPY (EGD) WITH PROPOFOL N/A 11/15/2018   Procedure: ESOPHAGOGASTRODUODENOSCOPY (EGD) WITH PROPOFOL;  Surgeon: Rogene Houston, MD;  [facility];  Service:  Endoscopy;  Laterality: N/A;  12:55  . HOT HEMOSTASIS N/A 10/10/2018   Procedure: HOT HEMOSTASIS (ARGON PLASMA COAGULATION/BICAP);  Surgeon: Doran Stabler, MD;  Location: [place];  Service: Gastroenterology;  Laterality: N/A;  . IR ANGIOGRAM SELECTIVE EACH ADDITIONAL VESSEL  10/08/2018  . IR ANGIOGRAM VISCERAL SELECTIVE  10/08/2018  . IR EMBO ART  VEN HEMORR LYMPH EXTRAV  INC GUIDE ROADMAPPING  10/08/2018  . IR US GUIDE VASC ACCESS RIGHT  10/08/2018  . MAZE N/A 09/05/2018   Procedure: MAZE;  Surgeon: Hannah Alberts, MD;  Location: [place];  Service: Open Heart Surgery;  Laterality: N/A;  . MITRAL VALVE REPLACEMENT N/A 09/05/2018   Procedure: MITRAL VALVE (MV) REPLACEMENT USING MAGNA MITRAL EASE 29MM BIOPROSTHESIS VALVE;  Surgeon: Hannah Alberts, MD;  Location: [place];  Service: Open Heart Surgery;  Laterality: N/A;  . REPLACEMENT ASCENDING AORTA N/A 09/05/2018   Procedure: SUPRACORONARY STRAIGHT GRAFT REPLACEMENT OF ASCENDING AORTA;  Surgeon: Hannah Alberts, MD;  Location: [place];  Service: Open Heart Surgery;  Laterality: N/A;  . RIGHT/LEFT HEART CATH AND CORONARY ANGIOGRAPHY N/A 08/29/2018   Procedure: RIGHT/LEFT HEART CATH AND CORONARY ANGIOGRAPHY;  Surgeon: Hannah Harp, MD;  Location: [place] [facility];  Service: Cardiovascular;  Laterality: N/A;  . TEE WITHOUT CARDIOVERSION N/A 08/29/2018   Procedure: TRANSESOPHAGEAL ECHOCARDIOGRAM (TEE);  Surgeon: Hannah Latch, MD;  Location: [place];  Service: Cardiovascular;  Laterality: N/A;  . TRICUSPID VALVE REPLACEMENT N/A 09/05/2018   Procedure: TRICUSPID VALVE REPAIR USING EDWARDS MC3 TRICUSPID ANNULOPLASTY RING SIZE T28;  Surgeon: Hannah Alberts, MD;  Location: [place];  Service: Open Heart Surgery;  Laterality: N/A;  . TUBAL LIGATION      Current Outpatient Medications  Medication Sig Dispense Refill  . acetaminophen (TYLENOL) 325 MG tablet Take 650 mg by mouth every 6 (six) hours as needed for fever.    [name] amLODipine  (NORVASC) 2.5 MG tablet Take 1 tablet (2.5 mg total) by mouth daily. 90 tablet 3  . losartan (COZAAR) 50 MG tablet Take 1 tablet (50 mg total) by mouth daily. 90 tablet 3  . metoprolol tartrate (LOPRESSOR) 50 MG tablet Take 1 tablet (50 mg total) by mouth 2 (two) times daily. 180 tablet 3  . pantoprazole (PROTONIX) 40 MG tablet Take 40 mg by mouth daily.    . rosuvastatin (CRESTOR) 5 MG tablet TAKE 1 TABLET BY MOUTH ON MONDAY, WEDNESDAY AND FRIDAY 36 tablet 0  . valACYclovir (VALTREX) 1000 MG tablet 2 po twice a day for one day as needed for fever blisters 28 tablet 6  . warfarin (COUMADIN) 2.5 MG tablet Take 2 tablets daily except 1 1/2 tablets on Sundays, Tuesdays and Thursdays 180 tablet 3   No current facility-administered medications for this visit.   Allergies:  Bee venom, Boniva [ibandronic acid], Lisinopril, and Latex   ROS:  No falls or syncope.  Physical Exam: VS:  BP 128/74   Pulse 64   Ht 5\' 1"  (1.549 m)   Wt 127 lb (57.6 kg)   SpO2 94%   BMI 24.00 kg/m , BMI Body  mass index is 24 kg/m.  Wt Readings from Last 3 Encounters:  11/26/19 127 lb (57.6 kg)  10/27/19 124 lb 6.4 oz (56.4 kg)  10/16/19 124 lb (56.2 kg)    General: Patient appears comfortable at rest. HEENT: Conjunctiva and lids normal, wearing a mask. Neck: Supple, no elevated JVP or carotid bruits, no thyromegaly. Lungs: Clear to auscultation, nonlabored breathing at rest. Cardiac: Regular rate and rhythm, no S3, 3/6 systolic murmur, no pericardial rub. Extremities: No pitting edema, distal pulses 2+.  ECG:  An ECG dated 10/27/2019 was personally reviewed today and demonstrated:  Sinus bradycardia with prolonged PR interval and right bundle branch block.  Recent Labwork: 06/26/2019: ALT 16; AST 18 07/03/2019: TSH 3.450 10/01/2019: BUN 13; Creatinine, Ser 0.71; Hemoglobin 12.2; Platelets 172; Potassium 3.8; Sodium 140     Component Value Date/Time   CHOL 152 07/03/2019 1108   TRIG 102 07/03/2019 1108   HDL  53 07/03/2019 1108   CHOLHDL 2.9 07/03/2019 1108   CHOLHDL 3.4 05/28/2014 1007   VLDL 12 05/28/2014 1007   LDLCALC 80 07/03/2019 1108    Other Studies Reviewed Today:  Echocardiogram 07/08/2019: 1. Left ventricular ejection fraction, by estimation, is 55 to 60%. The  left ventricle has normal function. The left ventricle has no regional  wall motion abnormalities. There is mild concentric left ventricular  hypertrophy. Left ventricular diastolic  parameters are indeterminate.  2. Right ventricular systolic function is mildly reduced. The right  ventricular size is normal. There is normal pulmonary artery systolic  pressure.  3. Left atrial size was severely dilated.  4. Right atrial size was severely dilated.  5. MAGNA MITRAL EASE 29MM BIOPROSTHESIS VALVE is in the MV position. The  mitral valve has been repaired/replaced. No evidence of mitral valve  regurgitation.  6. EDWARDS MC3 TRICUSPID ANNULOPLASTY RING SIZE T28 is present. The  tricuspid valve is has been repaired/replaced. Tricuspid valve  regurgitation is moderate.  7. MAGNA EASE 21MM AOTIC BIOPROSTHESIc VALVE is in the AV position. The  aortic valve has been repaired/replaced. Aortic valve regurgitation is  mild.  8. The inferior vena cava is normal in size with greater than 50%  respiratory variability, suggesting right atrial pressure of 3 mmHg.   Assessment and Plan:  1.  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  Echocardiogram from February is noted above, LVEF normal range.  Residual valvular regurgitation includes mild aortic and moderate tricuspid.  Continue observation.  2.  Paroxysmal to persistent atrial fibrillation as well as atypical atrial flutter.  She is doing reasonably well at this time, no obvious atrial arrhythmias in the last month.  Plan to continue Lopressor and Coumadin.   As discussed above, she could be considered for either Tikosyn or amiodarone if atrial arrhythmias escalate.  She continues to follow in the atrial fibrillation clinic and has seen Dr. Rayann Roy in consultation as well.  She is not a good candidate for ablation however with severe biatrial enlargement.  3.  Essential hypertension, she is also on Norvasc and losartan.  Continue to track blood pressure.  Medication Adjustments/Labs and Tests Ordered: Current medicines are reviewed at length with the patient today.  Concerns regarding medicines are outlined above.   Tests Ordered: No orders of the defined types were placed in this encounter.   Medication Changes: No orders of the defined types were placed in this encounter.   Disposition:  Follow  up 6 months in the [place] office.  Signed, Satira Sark, MD, [facility] 11/26/2019 2:18 PM    [place] [facility] [address], [place], [place] [address] Phone: [phone]; Fax: [phone]

## 2019-11-26 ENCOUNTER — Other Ambulatory Visit: Payer: Self-pay | Admitting: Family Medicine

## 2019-11-26 ENCOUNTER — Other Ambulatory Visit: Payer: Self-pay

## 2019-11-26 ENCOUNTER — Encounter: Payer: Self-pay | Admitting: Cardiology

## 2019-11-26 ENCOUNTER — Ambulatory Visit (INDEPENDENT_AMBULATORY_CARE_PROVIDER_SITE_OTHER): Payer: Medicare Other | Admitting: Cardiology

## 2019-11-26 VITALS — BP 128/74 | HR 64 | Ht 61.0 in | Wt 127.0 lb

## 2019-11-26 DIAGNOSIS — I4891 Unspecified atrial fibrillation: Secondary | ICD-10-CM | POA: Diagnosis not present

## 2019-11-26 DIAGNOSIS — I4892 Unspecified atrial flutter: Secondary | ICD-10-CM | POA: Diagnosis not present

## 2019-11-26 DIAGNOSIS — I10 Essential (primary) hypertension: Secondary | ICD-10-CM | POA: Diagnosis not present

## 2019-11-26 DIAGNOSIS — I2510 Atherosclerotic heart disease of native coronary artery without angina pectoris: Secondary | ICD-10-CM

## 2019-11-26 DIAGNOSIS — I38 Endocarditis, valve unspecified: Secondary | ICD-10-CM

## 2019-11-26 NOTE — Patient Instructions (Signed)
Medication Instructions:  Your physician recommends that you continue on your current medications as directed. Please refer to the Current Medication list given to you today.  *If you need a refill on your cardiac medications before your next appointment, please call your pharmacy*   Lab Work: None today If you have labs (blood work) drawn today and your tests are completely normal, you will receive your results only by:  [facility] (if you have MyChart) OR  A paper copy in the mail If you have any lab test that is abnormal or we need to change your treatment, we will call you to review the results.   Testing/Procedures: None today   Follow-Up: At [facility] you and your health needs are our priority.  As part of our continuing mission to provide you with exceptional heart care, we have created designated Provider Care Teams.  These Care Teams include your primary Cardiologist (physician) and Advanced Practice Providers (APPs -  Physician Assistants and Nurse Practitioners) who all work together to provide you with the care you need, when you need it.  We recommend signing up for the patient portal called "MyChart".  Sign up information is provided on this After Visit Summary.  MyChart is used to connect with patients for Virtual Visits (Telemedicine).  Patients are able to view lab/test results, encounter notes, upcoming appointments, etc.  Non-urgent messages can be sent to your provider as well.   To learn more about what you can do with MyChart, go to [email or web address].    Your next appointment:   6 month(s)  The format for your next appointment:   In Person  Provider:   Rozann Lesches, MD   Other Instructions Take blood pressure once a day for a couple of weeks. If stable, you may then check blood pressure a couple times a week.       Thank you for choosing [facility] !

## 2019-12-02 ENCOUNTER — Ambulatory Visit (INDEPENDENT_AMBULATORY_CARE_PROVIDER_SITE_OTHER): Payer: Medicare Other | Admitting: *Deleted

## 2019-12-02 DIAGNOSIS — Z953 Presence of xenogenic heart valve: Secondary | ICD-10-CM | POA: Diagnosis not present

## 2019-12-02 DIAGNOSIS — I4819 Other persistent atrial fibrillation: Secondary | ICD-10-CM | POA: Diagnosis not present

## 2019-12-02 DIAGNOSIS — Z5181 Encounter for therapeutic drug level monitoring: Secondary | ICD-10-CM | POA: Diagnosis not present

## 2019-12-02 LAB — POCT INR: INR: 2.9 (ref 2.0–3.0)

## 2019-12-02 NOTE — Patient Instructions (Signed)
Continue warfarin 2 tablets daily except 1 1/2 tablets on Sundays, Tuesdays and Thursdays Continue greens/salads Recheck in 4 wks

## 2019-12-24 ENCOUNTER — Encounter: Payer: Self-pay | Admitting: Family Medicine

## 2019-12-24 ENCOUNTER — Ambulatory Visit (INDEPENDENT_AMBULATORY_CARE_PROVIDER_SITE_OTHER): Payer: Medicare Other | Admitting: Family Medicine

## 2019-12-24 ENCOUNTER — Other Ambulatory Visit: Payer: Self-pay

## 2019-12-24 VITALS — BP 124/86 | HR 56 | Temp 97.4°F | Wt 125.0 lb

## 2019-12-24 DIAGNOSIS — I2510 Atherosclerotic heart disease of native coronary artery without angina pectoris: Secondary | ICD-10-CM

## 2019-12-24 DIAGNOSIS — Z1231 Encounter for screening mammogram for malignant neoplasm of breast: Secondary | ICD-10-CM

## 2019-12-24 DIAGNOSIS — Z23 Encounter for immunization: Secondary | ICD-10-CM

## 2019-12-24 NOTE — Progress Notes (Signed)
   Subjective:    Patient ID: Hannah Roy, female    DOB: 1940/08/06, 79 y.o.   MRN: [id number]  HPI Pt here for follow up. Pt doing well. Pt had episodes back in May or June of blood pressure being elevated. Pt did go to ED 10/01/19 for HTN and has seen cardiologist.  Pt states she has been eating well. Did not appetite after surgery.  T BILATERAL Patient is due for mammography Recent troubles with blood pressure being elevated Recent trouble with atrial fib Had previous heart surgery  Review of Systems  Constitutional: Negative for activity change and appetite change.  HENT: Negative for congestion and rhinorrhea.   Respiratory: Negative for cough and shortness of breath.   Cardiovascular: Negative for chest pain and leg swelling.  Gastrointestinal: Negative for abdominal pain, nausea and vomiting.  Skin: Negative for color change.  Neurological: Negative for dizziness and weakness.  Psychiatric/Behavioral: Negative for agitation and confusion.       Objective:   Physical Exam Vitals reviewed.  Constitutional:      General: She is not in acute distress. HENT:     Head: Normocephalic and atraumatic.  Eyes:     General:        Right eye: No discharge.        Left eye: No discharge.  Neck:     Trachea: No tracheal deviation.  Cardiovascular:     Rate and Rhythm: Normal rate and regular rhythm.     Heart sounds: Normal heart sounds. No murmur heard.   Pulmonary:     Effort: Pulmonary effort is normal. No respiratory distress.     Breath sounds: Normal breath sounds.  Lymphadenopathy:     Cervical: No cervical adenopathy.  Skin:    General: Skin is warm and dry.  Neurological:     Mental Status: She is alert.     Coordination: Coordination normal.  Psychiatric:        Behavior: Behavior normal.           Assessment & Plan:  Heart rate is controlled in the mid 50s Blood pressure overall good Extremities no edema skin warm dry continue current measures  follow-up again in 6 months time follow-up sooner if any blood pressure issues

## 2019-12-24 NOTE — Progress Notes (Signed)
01

## 2019-12-30 ENCOUNTER — Ambulatory Visit (INDEPENDENT_AMBULATORY_CARE_PROVIDER_SITE_OTHER): Payer: Medicare Other | Admitting: *Deleted

## 2019-12-30 DIAGNOSIS — I4819 Other persistent atrial fibrillation: Secondary | ICD-10-CM | POA: Diagnosis not present

## 2019-12-30 DIAGNOSIS — Z953 Presence of xenogenic heart valve: Secondary | ICD-10-CM | POA: Diagnosis not present

## 2019-12-30 DIAGNOSIS — Z5181 Encounter for therapeutic drug level monitoring: Secondary | ICD-10-CM

## 2019-12-30 LAB — POCT INR: INR: 3.2 — AB (ref 2.0–3.0)

## 2019-12-30 NOTE — Patient Instructions (Signed)
Hold warfarin tonight then decrease dose to 1 1/2 tablets daily except 2 tablets on Mondays and Thursdays Continue greens/salads Recheck in 3 wks

## 2020-01-16 ENCOUNTER — Ambulatory Visit (HOSPITAL_COMMUNITY)
Admission: RE | Admit: 2020-01-16 | Discharge: 2020-01-16 | Disposition: A | Discharge status: Home / Self Care | Payer: Medicare Other | Source: Ambulatory Visit | Attending: Family Medicine | Admitting: Family Medicine

## 2020-01-16 ENCOUNTER — Other Ambulatory Visit: Payer: Self-pay

## 2020-01-16 DIAGNOSIS — Z1231 Encounter for screening mammogram for malignant neoplasm of breast: Secondary | ICD-10-CM | POA: Insufficient documentation

## 2020-01-20 ENCOUNTER — Ambulatory Visit (INDEPENDENT_AMBULATORY_CARE_PROVIDER_SITE_OTHER): Payer: Medicare Other | Admitting: *Deleted

## 2020-01-20 DIAGNOSIS — Z953 Presence of xenogenic heart valve: Secondary | ICD-10-CM

## 2020-01-20 DIAGNOSIS — I4819 Other persistent atrial fibrillation: Secondary | ICD-10-CM | POA: Diagnosis not present

## 2020-01-20 DIAGNOSIS — Z5181 Encounter for therapeutic drug level monitoring: Secondary | ICD-10-CM

## 2020-01-20 LAB — POCT INR: INR: 2.5 (ref 2.0–3.0)

## 2020-01-20 NOTE — Patient Instructions (Signed)
Continue warfarin 1 1/2 tablets daily except 2 tablets on Mondays and Thursdays Continue greens/salads Recheck in 4 wks

## 2020-01-27 ENCOUNTER — Ambulatory Visit (HOSPITAL_COMMUNITY)
Admission: RE | Admit: 2020-01-27 | Discharge: 2020-01-27 | Disposition: A | Discharge status: Home / Self Care | Payer: Medicare Other | Source: Ambulatory Visit | Attending: Nurse Practitioner | Admitting: Nurse Practitioner

## 2020-01-27 ENCOUNTER — Other Ambulatory Visit: Payer: Self-pay

## 2020-01-27 ENCOUNTER — Encounter (HOSPITAL_COMMUNITY): Payer: Self-pay | Admitting: Nurse Practitioner

## 2020-01-27 ENCOUNTER — Ambulatory Visit (INDEPENDENT_AMBULATORY_CARE_PROVIDER_SITE_OTHER): Payer: Medicare Other | Admitting: *Deleted

## 2020-01-27 VITALS — BP 112/68 | HR 77 | Ht 61.0 in | Wt 124.8 lb

## 2020-01-27 DIAGNOSIS — Z953 Presence of xenogenic heart valve: Secondary | ICD-10-CM | POA: Insufficient documentation

## 2020-01-27 DIAGNOSIS — Z79899 Other long term (current) drug therapy: Secondary | ICD-10-CM | POA: Diagnosis not present

## 2020-01-27 DIAGNOSIS — D6869 Other thrombophilia: Secondary | ICD-10-CM | POA: Diagnosis not present

## 2020-01-27 DIAGNOSIS — Z7901 Long term (current) use of anticoagulants: Secondary | ICD-10-CM | POA: Diagnosis not present

## 2020-01-27 DIAGNOSIS — I480 Paroxysmal atrial fibrillation: Secondary | ICD-10-CM | POA: Insufficient documentation

## 2020-01-27 DIAGNOSIS — Z87891 Personal history of nicotine dependence: Secondary | ICD-10-CM | POA: Insufficient documentation

## 2020-01-27 DIAGNOSIS — I10 Essential (primary) hypertension: Secondary | ICD-10-CM | POA: Insufficient documentation

## 2020-01-27 DIAGNOSIS — I099 Rheumatic heart disease, unspecified: Secondary | ICD-10-CM | POA: Insufficient documentation

## 2020-01-27 DIAGNOSIS — I4892 Unspecified atrial flutter: Secondary | ICD-10-CM | POA: Diagnosis not present

## 2020-01-27 LAB — BASIC METABOLIC PANEL
Anion gap: 9 (ref 5–15)
BUN: 16 mg/dL (ref 8–23)
CO2: 26 mmol/L (ref 22–32)
Calcium: 9.4 mg/dL (ref 8.9–10.3)
Chloride: 106 mmol/L (ref 98–111)
Creatinine, Ser: 0.85 mg/dL (ref 0.44–1.00)
GFR, Estimated: >60 mL/min (ref >60)
Glucose, Bld: 100 mg/dL — ABNORMAL HIGH (ref 70–99)
Potassium: 4.4 mmol/L (ref 3.5–5.1)
Sodium: 141 mmol/L (ref 135–145)

## 2020-01-27 LAB — CBC
HCT: 41.4 % (ref 36.0–46.0)
Hemoglobin: 13.1 g/dL (ref 12.0–15.0)
MCH: 30.3 pg (ref 26.0–34.0)
MCHC: 31.6 g/dL (ref 30.0–36.0)
MCV: 95.8 fL (ref 80.0–100.0)
Platelets: 201 10*3/uL (ref 150–400)
RBC: 4.32 MIL/uL (ref 3.87–5.11)
RDW: 12 % (ref 11.5–15.5)
WBC: 4.7 10*3/uL (ref 4.0–10.5)
nRBC: 0 % (ref 0.0–0.2)

## 2020-01-27 LAB — PROTIME-INR
INR: 2.1 — ABNORMAL HIGH (ref 0.8–1.2)
Prothrombin Time: 22.7 seconds — ABNORMAL HIGH (ref 11.4–15.2)

## 2020-01-27 NOTE — Progress Notes (Addendum)
Electrophysiology  Note   Date:  01/27/2020   ID:  Hannah Roy, DOB [birth year], MRN [id number]   Provider location: [place], [place] [address] Evaluation Performed:f/u   PCP:  Kathyrn Drown, MD  Primary Cardiologist:  Dr. Domenic Polite  Primary Electrophysiologist: Dr. [name]  CC: afib     History of Present Illness: Hannah Roy is a 79 y.o. female wth h/o HTN, rheumatic heart disease, mild CAD, hypothyroidism, who presents for f/u in afib clinic for increased afib/flutter burden following valve surgery last year with MAZE by Dr. Roxy Manns.   She has a h/o of paroxysmal afib first found in a ER visit in 05/10/18.   She was admitted overnight, returned to SR  with  DILT drip and started on  eliquis, CHA2DS2VASc score of 4.  Her echo showed EF of 60-65%, severley dilated left and rt atrial size with myxomatous mitral valve, moderate basal septal hypertrophy, moderate tricuspid regurgitation and moderate to severe aortic regurgitation.  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   She saw Dr. Roxy Manns, 09/15/19 and found to be in atypical atrial flutter and referred here for consideration of cardioversion vrs ablation. Pt is in SR today but had a ER visit 6/23 with atrial flutter and poorly controlled  HTN. meds were adjusted,  BP is stable today. She has increased fatigue during and after arrhythmia spells. Has had covid shots.  F/u in afib clinic,01/27/20. This is a 3 month f/u from  [place]. She has some afib in June and saw me f/u and I referred on to Dr. [name]. She was in SR both of those visits. Dr. [name]  did not believe she would be an ablation candidate but if afib became more frequent,   consideration for  Tikosyn.The pt reports that she has been feeling great but this Saturday did a lot of work in the yard when she became very diaphoretic, lightheaded and short of breath. She  rested all of Sunday and is feeling better but her EKG shows atrial flutter rate controlled.   Today, she denies symptoms of orthopnea, PND, lower extremity edema, claudication,  presyncope, syncope, bleeding, or neurologic sequela. The patient is tolerating medications without difficulties and is otherwise without complaint today.   she denies symptoms of cough, fevers, chills, or new SOB worrisome for COVID 19.    Atrial Fibrillation Risk Factors:  she does not have symptoms or diagnosis of sleep apnea. she does have a history of rheumatic fever. she does not have a history of alcohol use. The patient does not have a history of early familial atrial fibrillation or other arrhythmias.  she has a BMI of Body mass index is 23.58 kg/m.[name] Filed Weights   01/27/20 1024  Weight: 56.6 kg    Past Medical History:  Diagnosis Date  . Aortic atherosclerosis ([place])   . 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 Atypical atrial flutter ([place])   . Colon polyps   . Essential hypertension   . History of seasonal allergies   . Hypertension   . Hypothyroidism   . Osteopenia 2006  . Persistent atrial fibrillation ([place])   . Rheumatic valvular disease   . 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   21 mm [employer]  stented bovine pericardial tissue valve  . S/P ascending aortic aneurysm repair 09/05/2018   28 mm Hemashield platinum supracoronary straight graft  . S/P Maze operation for atrial fibrillation 09/05/2018   Complete bilateral atrial lesion set using bipolar radiofrequency and cryothermy ablation with clipping of LA appendage  . 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   29 mm [facility] Mitral stented bovine pericardial tissue valve  . S/P tricuspid  valve repair 09/05/2018   28 mm [employer] mc3 ring annuloplasty   Past Surgical History:  Procedure Laterality Date  . AORTIC VALVE REPLACEMENT N/A 09/05/2018   Procedure: AORTIC VALVE REPLACEMENT (AVR) USING MAGNA EASE 21MM AOTIC BIOPROSTHESIS VALVE;  Surgeon: Rexene Alberts, MD;  Location: [facility];  Service: Open Heart Surgery;  Laterality: N/A;  . APPENDECTOMY    . CLIPPING OF ATRIAL APPENDAGE  09/05/2018   Procedure: Clipping Of Atrial Appendage;  Surgeon: Rexene Alberts, MD;  Location: [facility];  Service: Open Heart Surgery;;  . COLONOSCOPY  03/23/2011   repeat in 5 years,Procedure: COLONOSCOPY;  Surgeon: Rogene Houston, MD;  [facility];  Service: Endoscopy;  Laterality: N/A;  1:00  . COLONOSCOPY N/A 11/09/2016   Procedure: COLONOSCOPY;  Surgeon: Rogene Houston, MD;  [facility];  Service: Endoscopy;  Laterality: N/A;  1030  . ESOPHAGOGASTRODUODENOSCOPY N/A 10/10/2018   Procedure: ESOPHAGOGASTRODUODENOSCOPY (EGD);  Surgeon: Doran Stabler, MD;  Location: [facility];  Service: Gastroenterology;  Laterality: N/A;  . ESOPHAGOGASTRODUODENOSCOPY (EGD) WITH PROPOFOL N/A 10/08/2018   Procedure: ESOPHAGOGASTRODUODENOSCOPY (EGD) WITH PROPOFOL;  Surgeon: Danie Binder, MD;  [facility];  Service: Endoscopy;  Laterality: N/A;  . ESOPHAGOGASTRODUODENOSCOPY (EGD) WITH PROPOFOL N/A 11/15/2018   Procedure: ESOPHAGOGASTRODUODENOSCOPY (EGD) WITH PROPOFOL;  Surgeon: Rogene Houston, MD;  [facility];  Service: Endoscopy;  Laterality: N/A;  12:55  . HOT HEMOSTASIS N/A 10/10/2018   Procedure: HOT HEMOSTASIS (ARGON PLASMA COAGULATION/BICAP);  Surgeon: Doran Stabler, MD;  Location: [facility];  Service: Gastroenterology;  Laterality: N/A;  . IR ANGIOGRAM SELECTIVE EACH ADDITIONAL VESSEL  10/08/2018  . IR ANGIOGRAM VISCERAL SELECTIVE  10/08/2018  . IR EMBO ART  VEN HEMORR LYMPH EXTRAV  INC GUIDE ROADMAPPING  10/08/2018  . IR US GUIDE VASC ACCESS RIGHT   10/08/2018  . MAZE N/A 09/05/2018   Procedure: MAZE;  Surgeon: Rexene Alberts, MD;  Location: [facility];  Service: Open Heart Surgery;  Laterality: N/A;  . MITRAL VALVE REPLACEMENT N/A 09/05/2018   Procedure: MITRAL VALVE (MV) REPLACEMENT USING MAGNA MITRAL EASE 29MM BIOPROSTHESIS VALVE;  Surgeon: Rexene Alberts, MD;  Location: [facility];  Service: Open Heart Surgery;  Laterality: N/A;  . REPLACEMENT ASCENDING AORTA N/A 09/05/2018   Procedure: SUPRACORONARY STRAIGHT GRAFT REPLACEMENT OF ASCENDING AORTA;  Surgeon: Rexene Alberts, MD;  Location: [facility];  Service: Open Heart Surgery;  Laterality: N/A;  . RIGHT/LEFT HEART CATH AND CORONARY ANGIOGRAPHY N/A 08/29/2018   Procedure: RIGHT/LEFT HEART CATH AND CORONARY ANGIOGRAPHY;  Surgeon: Lorretta Harp, MD;  Location: [facility];  Service: Cardiovascular;  Laterality: N/A;  . TEE WITHOUT CARDIOVERSION N/A 08/29/2018   Procedure: TRANSESOPHAGEAL ECHOCARDIOGRAM (TEE);  Surgeon: Skeet Latch, MD;  Location: [facility];  Service: Cardiovascular;  Laterality: N/A;  . TRICUSPID VALVE REPLACEMENT N/A 09/05/2018   Procedure: TRICUSPID VALVE REPAIR USING EDWARDS MC3 TRICUSPID ANNULOPLASTY RING SIZE T28;  Surgeon: Rexene Alberts, MD;  Location: [facility];  Service: Open Heart Surgery;  Laterality: N/A;  . TUBAL LIGATION       Current Outpatient Medications  Medication Sig Dispense Refill  . acetaminophen (TYLENOL) 325 MG tablet Take 650 mg by mouth every 6 (six) hours as needed for fever.    [name] amLODipine (NORVASC) 2.5 MG tablet Take 1 tablet (2.5 mg total) by mouth daily. 90 tablet 3  . losartan (COZAAR) 50 MG tablet Take 1 tablet (50 mg total) by mouth daily. 90 tablet 3  . metoprolol tartrate (LOPRESSOR) 50 MG tablet Take 1 tablet (50 mg total) by mouth 2 (two) times daily. 180 tablet 3  . pantoprazole (PROTONIX) 40 MG tablet Take 40 mg by mouth daily.    . rosuvastatin (CRESTOR) 5 MG tablet TAKE 1 TABLET BY MOUTH ON MONDAY, WEDNESDAY AND FRIDAY 36  tablet 0  . valACYclovir (VALTREX) 1000 MG tablet 2 po twice a day for one day as needed for fever blisters 28 tablet 6  . warfarin (COUMADIN) 2.5 MG tablet Take 2 tablets daily except 1 1/2 tablets on Sundays, Tuesdays and Thursdays 180 tablet 3   No current facility-administered medications for this encounter.    Allergies:   Bee venom, Boniva [ibandronic acid], Lisinopril, and Latex   Social History:  The patient  reports that she has quit smoking. Her smoking use included cigarettes. She has a 5.00 pack-year smoking history. She has never used smokeless tobacco. She reports that she does not drink alcohol and does not use drugs.   Family History:  The patient's  family history includes Colon cancer in her daughter.    ROS:  Please see the history of present illness.   All other systems are personally reviewed and negative.   Exam: Well appearing, alert and conversant, regular work of breathing,  good skin color  Recent Labs: 06/26/2019: ALT 16 07/03/2019: TSH 3.450 01/27/2020: BUN 16; Creatinine, Ser 0.85; Hemoglobin 13.1; Platelets 201; Potassium 4.4; Sodium 141  personally reviewed    Other studies personally reviewed: Epic records reviewed  Echo-1. The left ventricle has normal systolic function of 62-22%. The cavity size is normal. There is moderate focal basal septal left ventricular wall thickness. Echo evidence of unable to assess due to arrhythmia (atrial fibrillation and/or flutter)  diastolic filling patterns.  2. Severely dilated left atrial size.  3. Moderately dilated right atrial size.  4. The mitral valve is myxomatous. There is mild thickening. Regurgitation is mild to moderate by color flow Doppler.  5. Normal tricuspid valve.  6. Tricuspid regurgitation moderate.  7. The aortic valve tricuspid. There is moderate thickening of the aortic valve. Aortic valve regurgitation is moderate to severe by color flow Doppler. The calculated aortic valve area is 0.99 cm  consistent with mild stenosis.  8. Mild dilatation of the aortic root.  9. The inferior vena cava was dilated in size with >50% respiratory variablity. 10. The patient was tachycardic throughout the study with evidence of atrial fibrillation and/or flutter. 11. No atrial level shunt detected by color flow Doppler.  FINDINGS  Left Ventricle: No evidence of left ventricular regional wall motion abnormalities. The left ventricle has normal systolic function of 97-98%. The cavity size is normal. There is moderate focal basal septal left ventricular wall thickness. Echo evidence  of unable to assess due to arrhythmia (atrial fibrillation and/or flutter) diastolic filling patterns. Right Ventricle: The right ventricle is normal in size. There is normal hypertrophy. There is normal systolic function. Left Atrium: The left atrium is severely dilated. Right Atrium: The right  atrial size is moderately dilated. Interatrial Septum: No atrial level shunt detected by color flow Doppler.  Pericardium: There is no evidence of pericardial effusion. Mitral Valve: The mitral valve is myxomatous. There is mild thickening. Regurgitation is mild to moderate by color flow Doppler. Tricuspid Valve: The tricuspid valve is normal in structure. Tricuspid regurgitation moderate by color flow Doppler. Aortic Valve: The aortic valve tricuspid. There is moderate thickening of the aortic valve. Aortic valve regurgitation is moderate to severe by color flow Doppler. The calculated aortic valve area is 0.99 cm consistent with mild stenosis. Pulmonic Valve: The pulmonic valve is grossly normal. Pulmonic valve regurgitation is trivial by color flow Doppler. Aorta: There is mild dilatation of the aortic root. Venous: The inferior vena cava was dilated in size with greater than 50% respiratory variablity. In comparison to the previous echocardiogram(s): There are no prior studies on this patient for comparison purposes. The  patient was tachycardic throughout the study with evidence of atrial fibrillation and/or flutter.   Echo-3/21- 1. Left ventricular ejection fraction, by estimation, is 55 to 60%. The  left ventricle has normal function. The left ventricle has no regional  wall motion abnormalities. There is mild concentric left ventricular  hypertrophy. Left ventricular diastolic  parameters are indeterminate.  2. Right ventricular systolic function is mildly reduced. The right  ventricular size is normal. There is normal pulmonary artery systolic  pressure.  3. Left atrial size was severely dilated.  4. Right atrial size was severely dilated.  5. MAGNA MITRAL EASE 29MM BIOPROSTHESIS VALVE is in the MV position. The  mitral valve has been repaired/replaced. No evidence of mitral valve  regurgitation.  6. EDWARDS MC3 TRICUSPID ANNULOPLASTY RING SIZE T28 is present. The  tricuspid valve is has been repaired/replaced. Tricuspid valve  regurgitation is moderate.  7. MAGNA EASE 21MM AOTIC BIOPROSTHESIc VALVE is in the AV position. The  aortic valve has been repaired/replaced. Aortic valve regurgitation is  mild.  8. The inferior vena cava is normal in size with greater than 50%  respiratory variability, suggesting right atrial pressure of 3 mmHg.      ASSESSMENT AND PLAN:  1. Paroxysmal afib/atrial flutter  S/p Maze 08/2018 Went back into atrial flutter probably on Saturday, ekg remains atrial flutter Pt is symptomatic with this We discussed options to restore SR We currently have several weeks of pt's awaiting Tikosyn admission  and pt has done well staying in SR until recently She is also on warfarin and does not want to wait the 4 weeks of therapeutic INR's  Therefore  will schedule pt for a TEE guided cardioversion If  ERAF then we will discuss tikosyn  Denies any excessive caffeine, snoring, alcohol use,tobacco use Continue metoprolol 50 mg bid  Continue warfarin  Bmet/cbc/covid today   Is scheduled to have INR drawn again am of cardioversion   This patients CHA2DS2-VASc Score and unadjusted Ischemic Stroke Rate (% per year) is equal to 4.8 % stroke rate/year from a score of 4  Above score calculated as 1 point each if present [CHF, HTN, DM, Vascular=MI/PAD/Aortic Plaque, Age if 65-74, or Female] Above score calculated as 2 points each if present [Age > 75, or Stroke/TIA/TE]  2.HTN Stable today   3. S/p bioprosthetic aortic valve, mitral valve replacement with tricuspid valve repair with ring annuloplasty with  MAZE and repair of ascending thoracic  aneurysm 08/2018  Per Dr. Roxy Manns     Labs/ tests ordered today include: none Orders Placed This Encounter  Procedures  .  Basic metabolic panel  . CBC  . Protime-INR  . EKG 12-Lead     Signed, Roderic Palau NP 01/27/2020 1:18 PM  [facility] [address] [place], [place] [address] [phone]

## 2020-01-27 NOTE — Patient Instructions (Addendum)
Cardioversion scheduled for Friday, October 22nd  - you will need INR prior to arrival  - Arrive at the [facility] and go to admitting at 1130AM  - Do not eat or drink anything after midnight the night prior to your procedure.  - Take all your morning medication (except diabetic medications) with a sip of water prior to arrival.  - You will not be able to drive home after your procedure.  - Do NOT miss any doses of your blood thinner - if you should miss a dose please notify our office immediately.  - If you feel as if you go back into normal rhythm prior to scheduled cardioversion, please notify our office immediately. If your procedure is canceled in the cardioversion suite you will be charged a cancellation fee.

## 2020-01-27 NOTE — H&P (View-Only) (Signed)
Electrophysiology  Note   Date:  01/27/2020   ID:  Hannah Roy, DOB 1940-11-01, MRN [id number]   Provider location: [place], [place] [address] Evaluation Performed:f/u   PCP:  Kathyrn Drown, MD  Primary Cardiologist:  Dr. Domenic Polite  Primary Electrophysiologist: Dr. [name]  CC: afib     History of Present Illness: Hannah Roy is a 79 y.o. female wth h/o HTN, rheumatic heart disease, mild CAD, hypothyroidism, who presents for f/u in afib clinic for increased afib/flutter burden following valve surgery last year with MAZE by Dr. Roxy Manns.   She has a h/o of paroxysmal afib first found in a ER visit in 05/10/18.   She was admitted overnight, returned to SR  with  DILT drip and started on  eliquis, CHA2DS2VASc score of 4.  Her echo showed EF of 60-65%, severley dilated left and rt atrial size with myxomatous mitral valve, moderate basal septal hypertrophy, moderate tricuspid regurgitation and moderate to severe aortic regurgitation.  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   She saw Dr. Roxy Manns, 09/15/19 and found to be in atypical atrial flutter and referred here for consideration of cardioversion vrs ablation. Pt is in SR today but had a ER visit 6/23 with atrial flutter and poorly controlled  HTN. meds were adjusted,  BP is stable today. She has increased fatigue during and after arrhythmia spells. Has had covid shots.  F/u in afib clinic,01/27/20. This is a 3 month f/u from  [place]. She has some afib in June and saw me f/u and I referred on to Dr. [name]. She was in SR both of those visits. Dr. [name]  did not believe she would be an ablation candidate but if afib became more frequent,   consideration for  Tikosyn.The pt reports that she has been feeling great but this Saturday did a lot of work in the yard when she became very diaphoretic, lightheaded and short of breath. She  rested all of Sunday and is feeling better but her EKG shows atrial flutter rate controlled.   Today, she denies symptoms of orthopnea, PND, lower extremity edema, claudication,  presyncope, syncope, bleeding, or neurologic sequela. The patient is tolerating medications without difficulties and is otherwise without complaint today.   she denies symptoms of cough, fevers, chills, or new SOB worrisome for COVID 19.    Atrial Fibrillation Risk Factors:  she does not have symptoms or diagnosis of sleep apnea. she does have a history of rheumatic fever. she does not have a history of alcohol use. The patient does not have a history of early familial atrial fibrillation or other arrhythmias.  she has a BMI of Body mass index is 23.58 kg/m.[name] Filed Weights   01/27/20 1024  Weight: 56.6 kg    Past Medical History:  Diagnosis Date  . Aortic atherosclerosis ([place])   . 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 Atypical atrial flutter ([place])   . Colon polyps   . Essential hypertension   . History of seasonal allergies   . Hypertension   . Hypothyroidism   . Osteopenia 2006  . Persistent atrial fibrillation ([facility])   . Rheumatic valvular disease   . 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   21 mm [employer]  stented bovine pericardial tissue valve  . S/P ascending aortic aneurysm repair 09/05/2018   28 mm Hemashield platinum supracoronary straight graft  . S/P Maze operation for atrial fibrillation 09/05/2018   Complete bilateral atrial lesion set using bipolar radiofrequency and cryothermy ablation with clipping of LA appendage  . 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   29 mm [facility] Mitral stented bovine pericardial tissue valve  . S/P tricuspid  valve repair 09/05/2018   28 mm [employer] mc3 ring annuloplasty   Past Surgical History:  Procedure Laterality Date  . AORTIC VALVE REPLACEMENT N/A 09/05/2018   Procedure: AORTIC VALVE REPLACEMENT (AVR) USING MAGNA EASE 21MM AOTIC BIOPROSTHESIS VALVE;  Surgeon: Rexene Alberts, MD;  Location: [facility];  Service: Open Heart Surgery;  Laterality: N/A;  . APPENDECTOMY    . CLIPPING OF ATRIAL APPENDAGE  09/05/2018   Procedure: Clipping Of Atrial Appendage;  Surgeon: Rexene Alberts, MD;  Location: [facility];  Service: Open Heart Surgery;;  . COLONOSCOPY  03/23/2011   repeat in 5 years,Procedure: COLONOSCOPY;  Surgeon: Rogene Houston, MD;  [facility];  Service: Endoscopy;  Laterality: N/A;  1:00  . COLONOSCOPY N/A 11/09/2016   Procedure: COLONOSCOPY;  Surgeon: Rogene Houston, MD;  [facility];  Service: Endoscopy;  Laterality: N/A;  1030  . ESOPHAGOGASTRODUODENOSCOPY N/A 10/10/2018   Procedure: ESOPHAGOGASTRODUODENOSCOPY (EGD);  Surgeon: Doran Stabler, MD;  Location: [facility];  Service: Gastroenterology;  Laterality: N/A;  . ESOPHAGOGASTRODUODENOSCOPY (EGD) WITH PROPOFOL N/A 10/08/2018   Procedure: ESOPHAGOGASTRODUODENOSCOPY (EGD) WITH PROPOFOL;  Surgeon: Danie Binder, MD;  [facility];  Service: Endoscopy;  Laterality: N/A;  . ESOPHAGOGASTRODUODENOSCOPY (EGD) WITH PROPOFOL N/A 11/15/2018   Procedure: ESOPHAGOGASTRODUODENOSCOPY (EGD) WITH PROPOFOL;  Surgeon: Rogene Houston, MD;  [facility];  Service: Endoscopy;  Laterality: N/A;  12:55  . HOT HEMOSTASIS N/A 10/10/2018   Procedure: HOT HEMOSTASIS (ARGON PLASMA COAGULATION/BICAP);  Surgeon: Doran Stabler, MD;  Location: [facility];  Service: Gastroenterology;  Laterality: N/A;  . IR ANGIOGRAM SELECTIVE EACH ADDITIONAL VESSEL  10/08/2018  . IR ANGIOGRAM VISCERAL SELECTIVE  10/08/2018  . IR EMBO ART  VEN HEMORR LYMPH EXTRAV  INC GUIDE ROADMAPPING  10/08/2018  . IR US GUIDE VASC ACCESS RIGHT   10/08/2018  . MAZE N/A 09/05/2018   Procedure: MAZE;  Surgeon: Rexene Alberts, MD;  Location: [facility];  Service: Open Heart Surgery;  Laterality: N/A;  . MITRAL VALVE REPLACEMENT N/A 09/05/2018   Procedure: MITRAL VALVE (MV) REPLACEMENT USING MAGNA MITRAL EASE 29MM BIOPROSTHESIS VALVE;  Surgeon: Rexene Alberts, MD;  Location: [facility];  Service: Open Heart Surgery;  Laterality: N/A;  . REPLACEMENT ASCENDING AORTA N/A 09/05/2018   Procedure: SUPRACORONARY STRAIGHT GRAFT REPLACEMENT OF ASCENDING AORTA;  Surgeon: Rexene Alberts, MD;  Location: [facility];  Service: Open Heart Surgery;  Laterality: N/A;  . RIGHT/LEFT HEART CATH AND CORONARY ANGIOGRAPHY N/A 08/29/2018   Procedure: RIGHT/LEFT HEART CATH AND CORONARY ANGIOGRAPHY;  Surgeon: Lorretta Harp, MD;  Location: [facility];  Service: Cardiovascular;  Laterality: N/A;  . TEE WITHOUT CARDIOVERSION N/A 08/29/2018   Procedure: TRANSESOPHAGEAL ECHOCARDIOGRAM (TEE);  Surgeon: Skeet Latch, MD;  Location: [facility];  Service: Cardiovascular;  Laterality: N/A;  . TRICUSPID VALVE REPLACEMENT N/A 09/05/2018   Procedure: TRICUSPID VALVE REPAIR USING EDWARDS MC3 TRICUSPID ANNULOPLASTY RING SIZE T28;  Surgeon: Rexene Alberts, MD;  Location: [facility];  Service: Open Heart Surgery;  Laterality: N/A;  . TUBAL LIGATION       Current Outpatient Medications  Medication Sig Dispense Refill  . acetaminophen (TYLENOL) 325 MG tablet Take 650 mg by mouth every 6 (six) hours as needed for fever.    [name] amLODipine (NORVASC) 2.5 MG tablet Take 1 tablet (2.5 mg total) by mouth daily. 90 tablet 3  . losartan (COZAAR) 50 MG tablet Take 1 tablet (50 mg total) by mouth daily. 90 tablet 3  . metoprolol tartrate (LOPRESSOR) 50 MG tablet Take 1 tablet (50 mg total) by mouth 2 (two) times daily. 180 tablet 3  . pantoprazole (PROTONIX) 40 MG tablet Take 40 mg by mouth daily.    . rosuvastatin (CRESTOR) 5 MG tablet TAKE 1 TABLET BY MOUTH ON MONDAY, WEDNESDAY AND FRIDAY 36  tablet 0  . valACYclovir (VALTREX) 1000 MG tablet 2 po twice a day for one day as needed for fever blisters 28 tablet 6  . warfarin (COUMADIN) 2.5 MG tablet Take 2 tablets daily except 1 1/2 tablets on Sundays, Tuesdays and Thursdays 180 tablet 3   No current facility-administered medications for this encounter.    Allergies:   Bee venom, Boniva [ibandronic acid], Lisinopril, and Latex   Social History:  The patient  reports that she has quit smoking. Her smoking use included cigarettes. She has a 5.00 pack-year smoking history. She has never used smokeless tobacco. She reports that she does not drink alcohol and does not use drugs.   Family History:  The patient's  family history includes Colon cancer in her daughter.    ROS:  Please see the history of present illness.   All other systems are personally reviewed and negative.   Exam: Well appearing, alert and conversant, regular work of breathing,  good skin color  Recent Labs: 06/26/2019: ALT 16 07/03/2019: TSH 3.450 01/27/2020: BUN 16; Creatinine, Ser 0.85; Hemoglobin 13.1; Platelets 201; Potassium 4.4; Sodium 141  personally reviewed    Other studies personally reviewed: Epic records reviewed  Echo-1. The left ventricle has normal systolic function of 11-94%. The cavity size is normal. There is moderate focal basal septal left ventricular wall thickness. Echo evidence of unable to assess due to arrhythmia (atrial fibrillation and/or flutter)  diastolic filling patterns.  2. Severely dilated left atrial size.  3. Moderately dilated right atrial size.  4. The mitral valve is myxomatous. There is mild thickening. Regurgitation is mild to moderate by color flow Doppler.  5. Normal tricuspid valve.  6. Tricuspid regurgitation moderate.  7. The aortic valve tricuspid. There is moderate thickening of the aortic valve. Aortic valve regurgitation is moderate to severe by color flow Doppler. The calculated aortic valve area is 0.99 cm  consistent with mild stenosis.  8. Mild dilatation of the aortic root.  9. The inferior vena cava was dilated in size with >50% respiratory variablity. 10. The patient was tachycardic throughout the study with evidence of atrial fibrillation and/or flutter. 11. No atrial level shunt detected by color flow Doppler.  FINDINGS  Left Ventricle: No evidence of left ventricular regional wall motion abnormalities. The left ventricle has normal systolic function of 17-40%. The cavity size is normal. There is moderate focal basal septal left ventricular wall thickness. Echo evidence  of unable to assess due to arrhythmia (atrial fibrillation and/or flutter) diastolic filling patterns. Right Ventricle: The right ventricle is normal in size. There is normal hypertrophy. There is normal systolic function. Left Atrium: The left atrium is severely dilated. Right Atrium: The right  atrial size is moderately dilated. Interatrial Septum: No atrial level shunt detected by color flow Doppler.  Pericardium: There is no evidence of pericardial effusion. Mitral Valve: The mitral valve is myxomatous. There is mild thickening. Regurgitation is mild to moderate by color flow Doppler. Tricuspid Valve: The tricuspid valve is normal in structure. Tricuspid regurgitation moderate by color flow Doppler. Aortic Valve: The aortic valve tricuspid. There is moderate thickening of the aortic valve. Aortic valve regurgitation is moderate to severe by color flow Doppler. The calculated aortic valve area is 0.99 cm consistent with mild stenosis. Pulmonic Valve: The pulmonic valve is grossly normal. Pulmonic valve regurgitation is trivial by color flow Doppler. Aorta: There is mild dilatation of the aortic root. Venous: The inferior vena cava was dilated in size with greater than 50% respiratory variablity. In comparison to the previous echocardiogram(s): There are no prior studies on this patient for comparison purposes. The  patient was tachycardic throughout the study with evidence of atrial fibrillation and/or flutter.   Echo-3/21- 1. Left ventricular ejection fraction, by estimation, is 55 to 60%. The  left ventricle has normal function. The left ventricle has no regional  wall motion abnormalities. There is mild concentric left ventricular  hypertrophy. Left ventricular diastolic  parameters are indeterminate.  2. Right ventricular systolic function is mildly reduced. The right  ventricular size is normal. There is normal pulmonary artery systolic  pressure.  3. Left atrial size was severely dilated.  4. Right atrial size was severely dilated.  5. MAGNA MITRAL EASE 29MM BIOPROSTHESIS VALVE is in the MV position. The  mitral valve has been repaired/replaced. No evidence of mitral valve  regurgitation.  6. EDWARDS MC3 TRICUSPID ANNULOPLASTY RING SIZE T28 is present. The  tricuspid valve is has been repaired/replaced. Tricuspid valve  regurgitation is moderate.  7. MAGNA EASE 21MM AOTIC BIOPROSTHESIc VALVE is in the AV position. The  aortic valve has been repaired/replaced. Aortic valve regurgitation is  mild.  8. The inferior vena cava is normal in size with greater than 50%  respiratory variability, suggesting right atrial pressure of 3 mmHg.      ASSESSMENT AND PLAN:  1. Paroxysmal afib/atrial flutter  S/p Maze 08/2018 Went back into atrial flutter probably on Saturday, ekg remains atrial flutter Pt is symptomatic with this We discussed options to restore SR We currently have several weeks of pt's awaiting Tikosyn admission  and pt has done well staying in SR until recently She is also on warfarin and does not want to wait the 4 weeks of therapeutic INR's  Therefore  will schedule pt for a TEE guided cardioversion If  ERAF then we will discuss tikosyn  Denies any excessive caffeine, snoring, alcohol use,tobacco use Continue metoprolol 50 mg bid  Continue warfarin  Bmet/cbc/covid today    Is scheduled to have INR drawn again am of cardioversion   This patients CHA2DS2-VASc Score and unadjusted Ischemic Stroke Rate (% per year) is equal to 4.8 % stroke rate/year from a score of 4  Above score calculated as 1 point each if present [CHF, HTN, DM, Vascular=MI/PAD/Aortic Plaque, Age if 65-74, or Female] Above score calculated as 2 points each if present [Age > 75, or Stroke/TIA/TE]  2.HTN Stable today   3. S/p bioprosthetic aortic valve, mitral valve replacement with tricuspid valve repair with ring annuloplasty with  MAZE and repair of ascending thoracic  aneurysm 08/2018  Per Dr. Roxy Manns     Labs/ tests ordered today include: none Orders Placed This Encounter  Procedures  . Basic metabolic panel  . CBC  . Protime-INR  . EKG 12-Lead     Signed, Roderic Palau NP 01/27/2020 1:18 PM  Afib [facility] [address] [place], [place] [address] [phone]

## 2020-01-27 NOTE — Patient Instructions (Signed)
Pending TEE/DCCV 01/30/20 Take warfarin 2 tablets today then resume 1 1/2 tablets daily except 2 tablets on Mondays and Thursdays Continue greens/salads Recheck in 1 wk

## 2020-01-28 ENCOUNTER — Other Ambulatory Visit (HOSPITAL_COMMUNITY)
Admission: RE | Admit: 2020-01-28 | Discharge: 2020-01-28 | Disposition: A | Discharge status: Home / Self Care | Payer: Medicare Other | Source: Ambulatory Visit | Attending: Internal Medicine | Admitting: Internal Medicine

## 2020-01-28 ENCOUNTER — Telehealth: Payer: Self-pay | Admitting: *Deleted

## 2020-01-28 DIAGNOSIS — Z20822 Contact with and (suspected) exposure to covid-19: Secondary | ICD-10-CM | POA: Diagnosis not present

## 2020-01-28 DIAGNOSIS — Z01812 Encounter for preprocedural laboratory examination: Secondary | ICD-10-CM | POA: Insufficient documentation

## 2020-01-28 NOTE — Telephone Encounter (Signed)
Returned a call to the pt and her DCCV is on 01/30/2020 and her INR needs to be checked prior to; the appt was made for 02/02/20 so changed it to 01/30/2020 at 1045 and pt was thankful that it was moved to the correct date. She is aware she will get her INR checked prior to going to the hospital for her procedure.

## 2020-01-28 NOTE — Telephone Encounter (Signed)
Per phone call from pt- she's confused on why she's needing the apt on the 25th when she's scheduled for her DCCV on Friday 01/30/2020  Please call pt @ [phone]

## 2020-01-29 LAB — SARS CORONAVIRUS 2 (TAT 6-24 HRS): SARS Coronavirus 2: NEGATIVE

## 2020-01-30 ENCOUNTER — Encounter (HOSPITAL_COMMUNITY)
Admission: RE | Disposition: A | Discharge status: Home / Self Care | Payer: Self-pay | Source: Home / Self Care | Attending: Internal Medicine

## 2020-01-30 ENCOUNTER — Ambulatory Visit (INDEPENDENT_AMBULATORY_CARE_PROVIDER_SITE_OTHER): Payer: Medicare Other | Admitting: Pharmacist

## 2020-01-30 ENCOUNTER — Ambulatory Visit (HOSPITAL_COMMUNITY): Payer: Medicare Other | Admitting: Anesthesiology

## 2020-01-30 ENCOUNTER — Ambulatory Visit (HOSPITAL_BASED_OUTPATIENT_CLINIC_OR_DEPARTMENT_OTHER)
Admission: RE | Admit: 2020-01-30 | Discharge: 2020-01-30 | Disposition: A | Discharge status: Home / Self Care | Payer: Medicare Other | Source: Home / Self Care | Attending: Nurse Practitioner | Admitting: Nurse Practitioner

## 2020-01-30 ENCOUNTER — Other Ambulatory Visit: Payer: Self-pay

## 2020-01-30 ENCOUNTER — Encounter (HOSPITAL_COMMUNITY): Payer: Self-pay | Admitting: Internal Medicine

## 2020-01-30 ENCOUNTER — Ambulatory Visit (HOSPITAL_COMMUNITY)
Admission: RE | Admit: 2020-01-30 | Discharge: 2020-01-30 | Disposition: A | Discharge status: Home / Self Care | Payer: Medicare Other | Attending: Internal Medicine | Admitting: Internal Medicine

## 2020-01-30 DIAGNOSIS — Z5181 Encounter for therapeutic drug level monitoring: Secondary | ICD-10-CM

## 2020-01-30 DIAGNOSIS — E039 Hypothyroidism, unspecified: Secondary | ICD-10-CM | POA: Insufficient documentation

## 2020-01-30 DIAGNOSIS — Z953 Presence of xenogenic heart valve: Secondary | ICD-10-CM | POA: Diagnosis not present

## 2020-01-30 DIAGNOSIS — I361 Nonrheumatic tricuspid (valve) insufficiency: Secondary | ICD-10-CM | POA: Diagnosis not present

## 2020-01-30 DIAGNOSIS — I4819 Other persistent atrial fibrillation: Secondary | ICD-10-CM

## 2020-01-30 DIAGNOSIS — Z91030 Bee allergy status: Secondary | ICD-10-CM | POA: Insufficient documentation

## 2020-01-30 DIAGNOSIS — I10 Essential (primary) hypertension: Secondary | ICD-10-CM | POA: Insufficient documentation

## 2020-01-30 DIAGNOSIS — I2510 Atherosclerotic heart disease of native coronary artery without angina pectoris: Secondary | ICD-10-CM | POA: Insufficient documentation

## 2020-01-30 DIAGNOSIS — I700 Atherosclerosis of aorta: Secondary | ICD-10-CM | POA: Insufficient documentation

## 2020-01-30 DIAGNOSIS — Z91040 Latex allergy status: Secondary | ICD-10-CM | POA: Insufficient documentation

## 2020-01-30 DIAGNOSIS — Z87891 Personal history of nicotine dependence: Secondary | ICD-10-CM | POA: Diagnosis not present

## 2020-01-30 DIAGNOSIS — I4892 Unspecified atrial flutter: Secondary | ICD-10-CM | POA: Diagnosis not present

## 2020-01-30 DIAGNOSIS — Z79899 Other long term (current) drug therapy: Secondary | ICD-10-CM | POA: Insufficient documentation

## 2020-01-30 DIAGNOSIS — Z7901 Long term (current) use of anticoagulants: Secondary | ICD-10-CM | POA: Insufficient documentation

## 2020-01-30 DIAGNOSIS — I480 Paroxysmal atrial fibrillation: Secondary | ICD-10-CM | POA: Diagnosis not present

## 2020-01-30 DIAGNOSIS — I4891 Unspecified atrial fibrillation: Secondary | ICD-10-CM

## 2020-01-30 DIAGNOSIS — I110 Hypertensive heart disease with heart failure: Secondary | ICD-10-CM | POA: Diagnosis not present

## 2020-01-30 DIAGNOSIS — I5033 Acute on chronic diastolic (congestive) heart failure: Secondary | ICD-10-CM | POA: Diagnosis not present

## 2020-01-30 DIAGNOSIS — Z888 Allergy status to other drugs, medicaments and biological substances status: Secondary | ICD-10-CM | POA: Insufficient documentation

## 2020-01-30 HISTORY — PX: CARDIOVERSION: SHX1299

## 2020-01-30 HISTORY — PX: TEE WITHOUT CARDIOVERSION: SHX5443

## 2020-01-30 HISTORY — PX: BUBBLE STUDY: SHX6837

## 2020-01-30 LAB — POCT INR: INR: 2.9 (ref 2.0–3.0)

## 2020-01-30 SURGERY — ECHOCARDIOGRAM, TRANSESOPHAGEAL
Anesthesia: Monitor Anesthesia Care

## 2020-01-30 MED ORDER — LACTATED RINGERS IV SOLN: INTRAVENOUS | Status: DC

## 2020-01-30 MED ORDER — SODIUM CHLORIDE 0.9 % IV SOLN: INTRAVENOUS | Status: DC

## 2020-01-30 MED ORDER — PROPOFOL 10 MG/ML IV BOLUS
INTRAVENOUS | Status: DC | PRN
  Administered 2020-01-30 (×2): 20 mg via INTRAVENOUS
  Administered 2020-01-30: 30 mg via INTRAVENOUS

## 2020-01-30 MED ORDER — BUTAMBEN-TETRACAINE-BENZOCAINE 2-2-14 % EX AERO
INHALATION_SPRAY | CUTANEOUS | Status: DC | PRN
  Administered 2020-01-30: 2 via TOPICAL

## 2020-01-30 MED ORDER — LIDOCAINE 2% (20 MG/ML) 5 ML SYRINGE
INTRAMUSCULAR | Status: DC | PRN
  Administered 2020-01-30: 80 mg via INTRAVENOUS

## 2020-01-30 MED ORDER — PROPOFOL 500 MG/50ML IV EMUL
INTRAVENOUS | Status: DC | PRN
  Administered 2020-01-30: 75 ug/kg/min via INTRAVENOUS

## 2020-01-30 NOTE — Anesthesia Preprocedure Evaluation (Addendum)
Anesthesia Evaluation  Patient identified by MRN, date of birth, ID band Patient awake    Reviewed: Allergy & Precautions, NPO status , Patient's Chart, lab work & pertinent test results  History of Anesthesia Complications Negative for: history of anesthetic complications  Airway Mallampati: I  TM Distance: >3 FB Neck ROM: Full    Dental no notable dental hx. (+) Teeth Intact, Dental Advisory Given   Pulmonary neg pulmonary ROS, former smoker,    Pulmonary exam normal breath sounds clear to auscultation       Cardiovascular hypertension, Pt. on medications and Pt. on home beta blockers negative cardio ROS Normal cardiovascular exam+ dysrhythmias (s/p MAZE 08/2018) Atrial Fibrillation + Valvular Problems/Murmurs (s/p MV replacement, AVR, TV repair, ascending aortic aneurysm repair 08/2018)  Rhythm:Regular Rate:Normal  IMPRESSIONS    1. Left ventricular ejection fraction, by estimation, is 55 to 60%. The  left ventricle has normal function. The left ventricle has no regional  wall motion abnormalities. There is mild concentric left ventricular  hypertrophy. Left ventricular diastolic  parameters are indeterminate.  2. Right ventricular systolic function is mildly reduced. The right  ventricular size is normal. There is normal pulmonary artery systolic  pressure.  3. Left atrial size was severely dilated.  4. Right atrial size was severely dilated.  5. MAGNA MITRAL EASE 29MM BIOPROSTHESIS VALVE is in the MV position. The  mitral valve has been repaired/replaced. No evidence of mitral valve  regurgitation.  6. EDWARDS MC3 TRICUSPID ANNULOPLASTY RING SIZE T28 is present. The  tricuspid valve is has been repaired/replaced. Tricuspid valve  regurgitation is moderate.  7. MAGNA EASE 21MM AOTIC BIOPROSTHESIc VALVE is in the AV position. The  aortic valve has been repaired/replaced. Aortic valve regurgitation is  mild.  8.  The inferior vena cava is normal in size with greater than 50%  respiratory variability, suggesting right atrial pressure of 3 mmHg.   LHC 2020 Prox LAD lesion is 40% stenosed. Mid Cx lesion is 40% stenosed. 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   Neuro/Psych negative neurological ROS  negative psych ROS   GI/Hepatic negative GI ROS, Neg liver ROS, PUD,   Endo/Other  negative endocrine ROSHypothyroidism   Renal/GU negative Renal ROS  negative genitourinary   Musculoskeletal negative musculoskeletal ROS (+)   Abdominal   Peds negative pediatric ROS (+)  Hematology negative hematology ROS (+) Blood dyscrasia (on coumadin, INR 1.2, Hgb 6.7, plt 122), anemia ,   Anesthesia Other Findings   Reproductive/Obstetrics negative OB ROS                            Anesthesia Physical  Anesthesia Plan  ASA: IV  Anesthesia Plan: MAC   Post-op Pain Management:    Induction: Intravenous  PONV Risk Score and Plan: 2 and Propofol infusion and Treatment may vary due to age or medical condition  Airway Management Planned: Natural Airway and Simple Face Mask  Additional Equipment:   Intra-op Plan:   Post-operative Plan:   Informed Consent:   Plan Discussed with:   Anesthesia Plan Comments:         Anesthesia Quick Evaluation

## 2020-01-30 NOTE — Interval H&P Note (Signed)
History and Physical Interval Note:  01/30/2020 11:56 AM  [place]  has presented today for surgery, with the diagnosis of AFIB.  The various methods of treatment have been discussed with the patient and family. After consideration of risks, benefits and other options for treatment, the patient has consented to  Procedure(s): TRANSESOPHAGEAL ECHOCARDIOGRAM (TEE) (N/A) CARDIOVERSION (N/A) as a surgical intervention.  The patient's history has been reviewed, patient examined, no change in status, stable for surgery.  I have reviewed the patient's chart and labs.  Questions were answered to the patient's satisfaction.     Elouise Munroe

## 2020-01-30 NOTE — Discharge Instructions (Signed)
Electrical Cardioversion Electrical cardioversion is the delivery of a jolt of electricity to restore a normal rhythm to the heart. A rhythm that is too fast or is not regular keeps the heart from pumping well. In this procedure, sticky patches or metal paddles are placed on the chest to deliver electricity to the heart from a device. This procedure may be done in an emergency if:  There is low or no blood pressure as a result of the heart rhythm.  Normal rhythm must be restored as fast as possible to protect the brain and heart from further damage.  It may save a life. Follow these instructions at home:  Do not drive for 24 hours if you were given a sedative during your procedure.  Take over-the-counter and prescription medicines only as told by your health care provider.  Ask your health care provider how to check your pulse. Check it often.  Rest for 48 hours after the procedure or as told by your health care provider.  Avoid or limit your caffeine use as told by your health care provider.  Keep all follow-up visits as told by your health care provider. This is important. Contact a health care provider if:  You feel like your heart is beating too quickly or your pulse is not regular.  You have a serious muscle cramp that does not go away. Get help right away if:  You have discomfort in your chest.  You are dizzy or you feel faint.  You have trouble breathing or you are short of breath.  Your speech is slurred.  You have trouble moving an arm or leg on one side of your body.  Your fingers or toes turn cold or blue. Summary  Electrical cardioversion is the delivery of a jolt of electricity to restore a normal rhythm to the heart.  This procedure may be done right away in an emergency or may be a scheduled procedure if the condition is not an emergency.  Generally, this is a safe procedure.  After the procedure, check your pulse often as told by your health care  provider.

## 2020-01-30 NOTE — Progress Notes (Signed)
  Echocardiogram Echocardiogram Transesophageal has been performed.  Hannah Roy 01/30/2020, 1:09 PM

## 2020-01-30 NOTE — Anesthesia Postprocedure Evaluation (Signed)
Anesthesia Post Note  Patient: Hannah Roy  Procedure(s) Performed: TRANSESOPHAGEAL ECHOCARDIOGRAM (TEE) (N/A ) CARDIOVERSION (N/A )     Patient location during evaluation: Endoscopy Anesthesia Type: MAC Level of consciousness: awake and alert Pain management: pain level controlled Vital Signs Assessment: post-procedure vital signs reviewed and stable Respiratory status: spontaneous breathing, nonlabored ventilation and respiratory function stable Cardiovascular status: blood pressure returned to baseline and stable Postop Assessment: no apparent nausea or vomiting Anesthetic complications: no   No complications documented.  Last Vitals:  Vitals:   01/30/20 1315 01/30/20 1325  BP: (!) 148/72 (!) 152/75  Pulse: 60 63  Resp: 15 16  Temp:    SpO2: 98% 100%    Last Pain:  Vitals:   01/30/20 1325  TempSrc:   PainSc: 0-No pain                 Lynda Rainwater

## 2020-01-30 NOTE — Patient Instructions (Signed)
Description   Resume 1 1/2 tablets daily except 2 tablets on Mondays and Thursdays Continue greens/salads Recheck in 1 wk

## 2020-01-30 NOTE — Anesthesia Procedure Notes (Signed)
Procedure Name: MAC Date/Time: 01/30/2020 12:10 PM Performed by: Trinna Post., CRNA Pre-anesthesia Checklist: Patient identified, Emergency Drugs available, Suction available, Patient being monitored and Timeout performed Patient Re-evaluated:Patient Re-evaluated prior to induction Oxygen Delivery Method: Nasal cannula Preoxygenation: Pre-oxygenation with 100% oxygen Induction Type: IV induction Placement Confirmation: positive ETCO2

## 2020-01-30 NOTE — Transfer of Care (Signed)
Immediate Anesthesia Transfer of Care Note  Patient: Hannah Roy  Procedure(s) Performed: TRANSESOPHAGEAL ECHOCARDIOGRAM (TEE) (N/A ) CARDIOVERSION (N/A )  Patient Location: PACU and Endoscopy Unit  Anesthesia Type:MAC  Level of Consciousness: awake, alert  and oriented  Airway & Oxygen Therapy: Patient Spontanous Breathing and Patient connected to nasal cannula oxygen  Post-op Assessment: Report given to RN and Post -op Vital signs reviewed and stable  Post vital signs: Reviewed and stable  Last Vitals:  Vitals Value Taken Time  BP 108/69 01/30/20 1256  Temp    Pulse 58 01/30/20 1257  Resp 16 01/30/20 1257  SpO2 96 % 01/30/20 1257  Vitals shown include unvalidated device data.  Last Pain:  Vitals:   01/30/20 1116  TempSrc: Oral  PainSc: 0-No pain         Complications: No complications documented.

## 2020-01-30 NOTE — CV Procedure (Signed)
INDICATIONS: Atrial flutter  PROCEDURE:   Informed consent was obtained prior to the procedure. The risks, benefits and alternatives for the procedure were discussed and the patient comprehended these risks.  Risks include, but are not limited to, cough, sore throat, vomiting, nausea, somnolence, esophageal and stomach trauma or perforation, bleeding, low blood pressure, aspiration, pneumonia, infection, trauma to the teeth and death.    After a procedural time-out, the oropharynx was anesthetized with 20% benzocaine spray.   During this procedure the patient was administered propofol per anesthesia.  The patient's heart rate, blood pressure, and oxygen saturation were monitored continuously during the procedure. The period of conscious sedation was 45 minutes, of which I was present face-to-face 100% of this time.  The transesophageal probe was inserted in the esophagus and stomach without difficulty and multiple views were obtained.  The patient was kept under observation until the patient left the procedure room.  The patient left the procedure room in stable condition.   Agitated microbubble saline contrast was administered.  COMPLICATIONS:    There were no immediate complications.  FINDINGS:   FORMAL ECHOCARDIOGRAM REPORT PENDING Normal appearing MVR, AVR, TV annuloplasty.   Preserved EF   LA appendage has been clipped, no residual appendage, no LA mural thrombus.    RECOMMENDATIONS:     Proceed to cardioversion.   Time Spent Directly with the Patient:  56minutes   Elouise Munroe 01/30/2020, 12:51 PM  Procedure: Electrical Cardioversion Indications:  Atrial Flutter  Procedure Details:  Consent: Risks of procedure as well as the alternatives and risks of each were explained to the (patient/caregiver).  Consent for procedure obtained.  Time Out: Verified patient identification, verified procedure, site/side was marked, verified correct patient position, special  equipment/implants available, medications/allergies/relevent history reviewed, required imaging and test results available. PERFORMED.  Patient placed on cardiac monitor, pulse oximetry, supplemental oxygen as necessary.  Sedation given: propofol per anesthesia Pacer pads placed anterior and posterior chest.  Cardioverted 1 time(s).  Cardioversion with synchronized biphasic 120J shock.  Evaluation: Findings: Post procedure EKG shows: sinus bradycardia Complications: None Patient did tolerate procedure well.  Time Spent Directly with the Patient:  15 minutes   Elouise Munroe 01/30/2020, 12:51 PM

## 2020-02-01 ENCOUNTER — Encounter (HOSPITAL_COMMUNITY): Payer: Self-pay | Admitting: Internal Medicine

## 2020-02-03 LAB — ECHO TEE
AR max vel: 2.29 cm2
AV Area VTI: 3.17 cm2
AV Area mean vel: 2.54 cm2
AV Mean grad: 4 mmHg
AV Peak grad: 9.4 mmHg
Ao pk vel: 1.53 m/s
Area-P 1/2: 3.6 cm2

## 2020-02-04 ENCOUNTER — Ambulatory Visit (INDEPENDENT_AMBULATORY_CARE_PROVIDER_SITE_OTHER): Payer: Medicare Other | Admitting: *Deleted

## 2020-02-04 DIAGNOSIS — I4891 Unspecified atrial fibrillation: Secondary | ICD-10-CM | POA: Diagnosis not present

## 2020-02-04 DIAGNOSIS — Z953 Presence of xenogenic heart valve: Secondary | ICD-10-CM | POA: Diagnosis not present

## 2020-02-04 DIAGNOSIS — I4819 Other persistent atrial fibrillation: Secondary | ICD-10-CM | POA: Diagnosis not present

## 2020-02-04 DIAGNOSIS — Z5181 Encounter for therapeutic drug level monitoring: Secondary | ICD-10-CM

## 2020-02-04 LAB — POCT INR: INR: 3.7 — AB (ref 2.0–3.0)

## 2020-02-04 NOTE — Patient Instructions (Signed)
Hold warfarin tonight then resume 1 1/2 tablets daily except 2 tablets on Mondays and Thursdays Continue greens/salads Recheck in 1 wk

## 2020-02-06 ENCOUNTER — Encounter (HOSPITAL_COMMUNITY): Payer: Self-pay | Admitting: Nurse Practitioner

## 2020-02-06 ENCOUNTER — Other Ambulatory Visit: Payer: Self-pay

## 2020-02-06 ENCOUNTER — Ambulatory Visit (HOSPITAL_COMMUNITY)
Admission: RE | Admit: 2020-02-06 | Discharge: 2020-02-06 | Disposition: A | Discharge status: Home / Self Care | Payer: Medicare Other | Source: Ambulatory Visit | Attending: Nurse Practitioner | Admitting: Nurse Practitioner

## 2020-02-06 VITALS — BP 116/74 | HR 48 | Ht 61.0 in | Wt 125.4 lb

## 2020-02-06 DIAGNOSIS — Z79899 Other long term (current) drug therapy: Secondary | ICD-10-CM | POA: Diagnosis not present

## 2020-02-06 DIAGNOSIS — I2510 Atherosclerotic heart disease of native coronary artery without angina pectoris: Secondary | ICD-10-CM | POA: Insufficient documentation

## 2020-02-06 DIAGNOSIS — E039 Hypothyroidism, unspecified: Secondary | ICD-10-CM | POA: Diagnosis not present

## 2020-02-06 DIAGNOSIS — I099 Rheumatic heart disease, unspecified: Secondary | ICD-10-CM | POA: Diagnosis not present

## 2020-02-06 DIAGNOSIS — I480 Paroxysmal atrial fibrillation: Secondary | ICD-10-CM | POA: Diagnosis not present

## 2020-02-06 DIAGNOSIS — Z7901 Long term (current) use of anticoagulants: Secondary | ICD-10-CM | POA: Diagnosis not present

## 2020-02-06 DIAGNOSIS — I4892 Unspecified atrial flutter: Secondary | ICD-10-CM | POA: Diagnosis not present

## 2020-02-06 DIAGNOSIS — D6869 Other thrombophilia: Secondary | ICD-10-CM | POA: Diagnosis not present

## 2020-02-06 DIAGNOSIS — Z953 Presence of xenogenic heart valve: Secondary | ICD-10-CM | POA: Insufficient documentation

## 2020-02-06 DIAGNOSIS — I10 Essential (primary) hypertension: Secondary | ICD-10-CM | POA: Insufficient documentation

## 2020-02-06 MED ORDER — METOPROLOL TARTRATE 25 MG PO TABS: 25.0000 mg | ORAL_TABLET | Freq: Two times a day (BID) | ORAL | 3 refills | Status: DC

## 2020-02-06 NOTE — Patient Instructions (Signed)
Decrease metoprolol to 25mg twice a day 

## 2020-02-06 NOTE — Progress Notes (Signed)
Electrophysiology  Note   Date:  02/06/2020   ID:  Hannah Roy 1941-03-20, MRN [id number]   Provider location: [place], [place] [address] Evaluation Performed:f/u   PCP:  Kathyrn Drown, MD  Primary Cardiologist:  Dr. Domenic Polite  Primary Electrophysiologist: Dr. [name]  CC: afib     History of Present Illness: Hannah Roy is a 79 y.o. female wth h/o HTN, rheumatic heart disease, mild CAD, hypothyroidism, who presents for f/u in afib clinic for increased afib/flutter burden following valve surgery last year with MAZE by Dr. Roxy Manns.   She has a h/o of paroxysmal afib first found in a ER visit in 05/10/18.   She was admitted overnight, returned to SR  with  DILT drip and started on  eliquis, CHA2DS2VASc score of 4.  Her echo showed EF of 60-65%, severley dilated left and rt atrial size with myxomatous mitral valve, moderate basal septal hypertrophy, moderate tricuspid regurgitation and moderate to severe aortic regurgitation.  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   She saw Dr. Roxy Manns, 09/15/19 and found to be in atypical atrial flutter and referred here for consideration of cardioversion vrs ablation. Pt is in SR today but had a ER visit 6/23 with atrial flutter and poorly controlled  HTN. meds were adjusted,  BP is stable today. She has increased fatigue during and after arrhythmia spells. Has had covid shots.  F/u in afib clinic,01/27/20. This is a 3 month f/u from  [place]. She has some afib in June and saw me f/u and I referred on to Dr. [name]. She was in SR both of those visits. Dr. [name]  did not believe she would be an ablation candidate but if afib became more frequent,   consideration for  Tikosyn.The pt reports that she has been feeling great but this Saturday did a lot of work in the yard when she became very diaphoretic, lightheaded and short of breath. She  rested all of Sunday and is feeling better but her EKG shows atrial flutter rate controlled.   F/u in afib clinic, 10/29. She is now s/p successful cardioversion and is staying in rhythm,  the shortness of breath is better but she feels weak and lightheaded. She  is in Sinus brady at 48 bpm. She will need reduction of her BB. Continues on warfarin, her  last INR was 3.7. Her warfarin was held for a day.  Today, she denies symptoms of orthopnea, PND, lower extremity edema, claudication,  presyncope, syncope, bleeding, or neurologic sequela. The patient is tolerating medications without difficulties and is otherwise without complaint today.   she denies symptoms of cough, fevers, chills, or new SOB worrisome for COVID 19.    Atrial Fibrillation Risk Factors:  she does not have symptoms or diagnosis of sleep apnea. she does have a history of rheumatic fever. she does not have a history of alcohol use. The patient does not have a history of early familial atrial fibrillation or other arrhythmias.  she has a BMI of Body mass index is 23.69 kg/m.[name] Filed Weights   02/06/20 1125  Weight: 56.9 kg    Past Medical History:  Diagnosis Date  . Aortic atherosclerosis ([place])   . 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 Atypical atrial  flutter ([place])   . Colon polyps   . Essential hypertension   . History of seasonal allergies   . Hypertension   . Hypothyroidism   . Osteopenia 2006  . Persistent atrial fibrillation ([place])   . Rheumatic valvular disease   . 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   21 mm [facility] Ease stented bovine pericardial tissue valve  . S/P ascending aortic aneurysm repair 09/05/2018   28 mm Hemashield platinum supracoronary straight graft  . S/P Maze operation  for atrial fibrillation 09/05/2018   Complete bilateral atrial lesion set using bipolar radiofrequency and cryothermy ablation with clipping of LA appendage  . 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   29 mm [facility] Mitral stented bovine pericardial tissue valve  . 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   28 mm Edwards mc3 ring annuloplasty   Past Surgical History:  Procedure Laterality Date  . AORTIC VALVE REPLACEMENT N/A 09/05/2018   Procedure: AORTIC VALVE REPLACEMENT (AVR) USING MAGNA EASE 21MM AOTIC BIOPROSTHESIS VALVE;  Surgeon: Rexene Alberts, MD;  Location: [place];  Service: Open Heart Surgery;  Laterality: N/A;  . APPENDECTOMY    . BUBBLE STUDY  01/30/2020   Procedure: BUBBLE STUDY;  Surgeon: Elouise Munroe, MD;  Location: [place];  Service: Cardiovascular;;  . CARDIOVERSION N/A 01/30/2020   Procedure: CARDIOVERSION;  Surgeon: Elouise Munroe, MD;  Location: [facility] ENDOSCOPY;  Service: Cardiovascular;  Laterality: N/A;  . CLIPPING OF ATRIAL APPENDAGE  09/05/2018   Procedure: Clipping Of Atrial Appendage;  Surgeon: Rexene Alberts, MD;  Location: [facility] OR;  Service: Open Heart Surgery;;  . COLONOSCOPY  03/23/2011   repeat in 5 years,Procedure: COLONOSCOPY;  Surgeon: Rogene Houston, MD;  [facility];  Service: Endoscopy;  Laterality: N/A;  1:00  . COLONOSCOPY N/A 11/09/2016   Procedure: COLONOSCOPY;  Surgeon: Rogene Houston, MD;  [facility];  Service: Endoscopy;  Laterality: N/A;  1030  . ESOPHAGOGASTRODUODENOSCOPY N/A 10/10/2018   Procedure: ESOPHAGOGASTRODUODENOSCOPY (EGD);  Surgeon: Doran Stabler, MD;  Location: [place];  Service: Gastroenterology;  Laterality: N/A;  . ESOPHAGOGASTRODUODENOSCOPY (EGD) WITH PROPOFOL N/A 10/08/2018   Procedure: ESOPHAGOGASTRODUODENOSCOPY (EGD) WITH PROPOFOL;  Surgeon: Danie Binder, MD;  [facility];  Service: Endoscopy;  Laterality: N/A;  . ESOPHAGOGASTRODUODENOSCOPY  (EGD) WITH PROPOFOL N/A 11/15/2018   Procedure: ESOPHAGOGASTRODUODENOSCOPY (EGD) WITH PROPOFOL;  Surgeon: Rogene Houston, MD;  [facility];  Service: Endoscopy;  Laterality: N/A;  12:55  . HOT HEMOSTASIS N/A 10/10/2018   Procedure: HOT HEMOSTASIS (ARGON PLASMA COAGULATION/BICAP);  Surgeon: Doran Stabler, MD;  Location: [place];  Service: Gastroenterology;  Laterality: N/A;  . IR ANGIOGRAM SELECTIVE EACH ADDITIONAL VESSEL  10/08/2018  . IR ANGIOGRAM VISCERAL SELECTIVE  10/08/2018  . IR EMBO ART  VEN HEMORR LYMPH EXTRAV  INC GUIDE ROADMAPPING  10/08/2018  . IR US GUIDE VASC ACCESS RIGHT  10/08/2018  . MAZE N/A 09/05/2018   Procedure: MAZE;  Surgeon: Rexene Alberts, MD;  Location: [place];  Service: Open Heart Surgery;  Laterality: N/A;  . MITRAL VALVE REPLACEMENT N/A 09/05/2018   Procedure: MITRAL VALVE (MV) REPLACEMENT USING MAGNA MITRAL EASE 29MM BIOPROSTHESIS VALVE;  Surgeon: Rexene Alberts, MD;  Location: [place];  Service: Open Heart Surgery;  Laterality: N/A;  . REPLACEMENT ASCENDING AORTA N/A 09/05/2018   Procedure: SUPRACORONARY STRAIGHT GRAFT REPLACEMENT OF ASCENDING AORTA;  Surgeon: Rexene Alberts, MD;  Location:  [place] OR;  Service: Open Heart Surgery;  Laterality: N/A;  . RIGHT/LEFT HEART CATH AND CORONARY ANGIOGRAPHY N/A 08/29/2018   Procedure: RIGHT/LEFT HEART CATH AND CORONARY ANGIOGRAPHY;  Surgeon: Lorretta Harp, MD;  Location: [place] CV LAB;  Service: Cardiovascular;  Laterality: N/A;  . TEE WITHOUT CARDIOVERSION N/A 08/29/2018   Procedure: TRANSESOPHAGEAL ECHOCARDIOGRAM (TEE);  Surgeon: Skeet Latch, MD;  Location: [place];  Service: Cardiovascular;  Laterality: N/A;  . TEE WITHOUT CARDIOVERSION N/A 01/30/2020   Procedure: TRANSESOPHAGEAL ECHOCARDIOGRAM (TEE);  Surgeon: Elouise Munroe, MD;  Location: [facility] ENDOSCOPY;  Service: Cardiovascular;  Laterality: N/A;  . TRICUSPID VALVE REPLACEMENT N/A 09/05/2018   Procedure: TRICUSPID VALVE REPAIR USING  EDWARDS MC3 TRICUSPID ANNULOPLASTY RING SIZE T28;  Surgeon: Rexene Alberts, MD;  Location: [place];  Service: Open Heart Surgery;  Laterality: N/A;  . TUBAL LIGATION       Current Outpatient Medications  Medication Sig Dispense Refill  . amLODipine (NORVASC) 2.5 MG tablet Take 1 tablet (2.5 mg total) by mouth daily. (Patient taking differently: Take 2.5 mg by mouth every evening. ) 90 tablet 3  . losartan (COZAAR) 50 MG tablet Take 1 tablet (50 mg total) by mouth daily. 90 tablet 3  . metoprolol tartrate (LOPRESSOR) 25 MG tablet Take 1 tablet (25 mg total) by mouth 2 (two) times daily. 180 tablet 3  . pantoprazole (PROTONIX) 40 MG tablet Take 40 mg by mouth daily.    . rosuvastatin (CRESTOR) 5 MG tablet TAKE 1 TABLET BY MOUTH ON MONDAY, [facility] AND FRIDAY (Patient taking differently: Take 5 mg by mouth every Monday, Wednesday, and Friday. ) 36 tablet 0  . warfarin (COUMADIN) 2.5 MG tablet Take 2 tablets daily except 1 1/2 tablets on Sundays, Tuesdays and Thursdays (Patient taking differently: Take 2.5-3.75 mg by mouth See admin instructions. Take 5 mg by mouth on Tuesday and Thursday, take 3.75 mg on Monday, Wednesday, Friday, Saturday and Sunday) 180 tablet 3  . acetaminophen (TYLENOL) 325 MG tablet Take 650 mg by mouth every 6 (six) hours as needed for moderate pain or fever.  (Patient not taking: Reported on 02/06/2020)     No current facility-administered medications for this encounter.    Allergies:   Bee venom, Boniva [ibandronic acid], Lisinopril, and Latex   Social History:  The patient  reports that she has quit smoking. Her smoking use included cigarettes. She has a 5.00 pack-year smoking history. She has never used smokeless tobacco. She reports that she does not drink alcohol and does not use drugs.   Family History:  The patient's  family history includes Colon cancer in her daughter.    ROS:  Please see the history of present illness.   All other systems are personally  reviewed and negative.   Exam: Well appearing, alert and conversant, regular work of breathing,  good skin color  Recent Labs: 06/26/2019: ALT 16 07/03/2019: TSH 3.450 01/27/2020: BUN 16; Creatinine, Ser 0.85; Hemoglobin 13.1; Platelets 201; Potassium 4.4; Sodium 141  personally reviewed    Other studies personally reviewed: Epic records reviewed  Echo-1. The left ventricle has normal systolic function of 84-16%. The cavity size is normal. There is moderate focal basal septal left ventricular wall thickness. Echo evidence of unable to assess due to arrhythmia (atrial fibrillation and/or flutter)  diastolic filling patterns.  2. Severely dilated left atrial size.  3. Moderately dilated right atrial size.  4. The mitral valve is myxomatous. There is mild thickening. Regurgitation is mild to moderate  by color flow Doppler.  5. Normal tricuspid valve.  6. Tricuspid regurgitation moderate.  7. The aortic valve tricuspid. There is moderate thickening of the aortic valve. Aortic valve regurgitation is moderate to severe by color flow Doppler. The calculated aortic valve area is 0.99 cm consistent with mild stenosis.  8. Mild dilatation of the aortic root.  9. The inferior vena cava was dilated in size with >50% respiratory variablity. 10. The patient was tachycardic throughout the study with evidence of atrial fibrillation and/or flutter. 11. No atrial level shunt detected by color flow Doppler.  FINDINGS  Left Ventricle: No evidence of left ventricular regional wall motion abnormalities. The left ventricle has normal systolic function of 70-48%. The cavity size is normal. There is moderate focal basal septal left ventricular wall thickness. Echo evidence  of unable to assess due to arrhythmia (atrial fibrillation and/or flutter) diastolic filling patterns. Right Ventricle: The right ventricle is normal in size. There is normal hypertrophy. There is normal systolic function. Left Atrium: The  left atrium is severely dilated. Right Atrium: The right atrial size is moderately dilated. Interatrial Septum: No atrial level shunt detected by color flow Doppler.  Pericardium: There is no evidence of pericardial effusion. Mitral Valve: The mitral valve is myxomatous. There is mild thickening. Regurgitation is mild to moderate by color flow Doppler. Tricuspid Valve: The tricuspid valve is normal in structure. Tricuspid regurgitation moderate by color flow Doppler. Aortic Valve: The aortic valve tricuspid. There is moderate thickening of the aortic valve. Aortic valve regurgitation is moderate to severe by color flow Doppler. The calculated aortic valve area is 0.99 cm consistent with mild stenosis. Pulmonic Valve: The pulmonic valve is grossly normal. Pulmonic valve regurgitation is trivial by color flow Doppler. Aorta: There is mild dilatation of the aortic root. Venous: The inferior vena cava was dilated in size with greater than 50% respiratory variablity. In comparison to the previous echocardiogram(s): There are no prior studies on this patient for comparison purposes. The patient was tachycardic throughout the study with evidence of atrial fibrillation and/or flutter.   Echo-3/21- 1. Left ventricular ejection fraction, by estimation, is 55 to 60%. The  left ventricle has normal function. The left ventricle has no regional  wall motion abnormalities. There is mild concentric left ventricular  hypertrophy. Left ventricular diastolic  parameters are indeterminate.  2. Right ventricular systolic function is mildly reduced. The right  ventricular size is normal. There is normal pulmonary artery systolic  pressure.  3. Left atrial size was severely dilated.  4. Right atrial size was severely dilated.  5. MAGNA MITRAL EASE 29MM BIOPROSTHESIS VALVE is in the MV position. The  mitral valve has been repaired/replaced. No evidence of mitral valve  regurgitation.  6. EDWARDS MC3  TRICUSPID ANNULOPLASTY RING SIZE T28 is present. The  tricuspid valve is has been repaired/replaced. Tricuspid valve  regurgitation is moderate.  7. MAGNA EASE 21MM AOTIC BIOPROSTHESIc VALVE is in the AV position. The  aortic valve has been repaired/replaced. Aortic valve regurgitation is  mild.  8. The inferior vena cava is normal in size with greater than 50%  respiratory variability, suggesting right atrial pressure of 3 mmHg.      ASSESSMENT AND PLAN:  1. Paroxysmal afib/atrial flutter  S/p Maze 08/2018 Went back into atrial flutter now s/p cardioversion, and staying in Sinus rhythm but  slow at 48 bpm Pt is symptomatic with this Will decrease BB to 25 mg bid  If  ERAF then we will discuss tikosyn  Denies any excessive caffeine, snoring, alcohol use,tobacco use Continue warfarin   This patients CHA2DS2-VASc Score and unadjusted Ischemic Stroke Rate (% per year) is equal to 4.8 % stroke rate/year from a score of 4  Above score calculated as 1 point each if present [CHF, HTN, DM, Vascular=MI/PAD/Aortic Plaque, Age if 65-74, or Female] Above score calculated as 2 points each if present [Age > 75, or Stroke/TIA/TE]  2.HTN Stable today   3. S/p bioprosthetic aortic valve, mitral valve replacement with tricuspid valve repair with ring annuloplasty with  MAZE and repair of ascending thoracic  aneurysm 08/2018  Per Dr. Roxy Manns     Labs/ tests ordered today include: none Orders Placed This Encounter  Procedures  . EKG 12-Lead   F/u in one month  Signed, Hannah Palau NP 02/06/2020 11:49 AM  Afib [facility] [address] [place], [place] [address] [phone]

## 2020-02-11 ENCOUNTER — Ambulatory Visit (INDEPENDENT_AMBULATORY_CARE_PROVIDER_SITE_OTHER): Payer: Medicare Other | Admitting: *Deleted

## 2020-02-11 DIAGNOSIS — Z953 Presence of xenogenic heart valve: Secondary | ICD-10-CM | POA: Diagnosis not present

## 2020-02-11 DIAGNOSIS — I4891 Unspecified atrial fibrillation: Secondary | ICD-10-CM | POA: Diagnosis not present

## 2020-02-11 DIAGNOSIS — I4819 Other persistent atrial fibrillation: Secondary | ICD-10-CM | POA: Diagnosis not present

## 2020-02-11 DIAGNOSIS — Z5181 Encounter for therapeutic drug level monitoring: Secondary | ICD-10-CM | POA: Diagnosis not present

## 2020-02-11 LAB — POCT INR: INR: 1.8 — AB (ref 2.0–3.0)

## 2020-02-11 NOTE — Patient Instructions (Signed)
Take warfarin 2 1/2 tablets tonight continue 1 1/2 tablets daily except 2 tablets on Mondays and Thursdays Continue greens/salads Recheck in 1 wk

## 2020-02-14 ENCOUNTER — Other Ambulatory Visit: Payer: Self-pay | Admitting: Family Medicine

## 2020-02-17 ENCOUNTER — Ambulatory Visit (INDEPENDENT_AMBULATORY_CARE_PROVIDER_SITE_OTHER): Payer: Medicare Other | Admitting: *Deleted

## 2020-02-17 DIAGNOSIS — Z5181 Encounter for therapeutic drug level monitoring: Secondary | ICD-10-CM | POA: Diagnosis not present

## 2020-02-17 DIAGNOSIS — I4891 Unspecified atrial fibrillation: Secondary | ICD-10-CM | POA: Diagnosis not present

## 2020-02-17 DIAGNOSIS — I4819 Other persistent atrial fibrillation: Secondary | ICD-10-CM

## 2020-02-17 DIAGNOSIS — Z953 Presence of xenogenic heart valve: Secondary | ICD-10-CM

## 2020-02-17 LAB — POCT INR: INR: 2.9 (ref 2.0–3.0)

## 2020-02-17 NOTE — Patient Instructions (Signed)
Post DCCV (3rd INR) Continue warfarin 1 1/2 tablets daily except 2 tablets on Mondays and Thursdays Continue greens/salads Recheck in 1 wk

## 2020-02-20 ENCOUNTER — Telehealth (HOSPITAL_COMMUNITY): Payer: Self-pay | Admitting: *Deleted

## 2020-02-20 ENCOUNTER — Telehealth: Payer: Self-pay

## 2020-02-20 MED ORDER — METOPROLOL TARTRATE 25 MG PO TABS: 37.5000 mg | ORAL_TABLET | Freq: Two times a day (BID) | ORAL | 3 refills | Status: DC

## 2020-02-20 NOTE — Telephone Encounter (Signed)
Patient states since yesterday she has been back out of rhythm HR in the 112-116 range. Discussed with Roderic Palau NP will increase metoprolol back to 37.5mg  BID and follow up next week. Pt in agreement.

## 2020-02-20 NOTE — Telephone Encounter (Signed)
Hannah Roy called to day her heart rate today and yesterday is running 105-116 bpm. BP 120/85.  She also c/o dizziness. She mentioned her lopressor was reduced to 25 mg BID recently due to HR 48. She doesn't want to go through the weekend worried her HR is elevated.

## 2020-02-24 ENCOUNTER — Ambulatory Visit (INDEPENDENT_AMBULATORY_CARE_PROVIDER_SITE_OTHER): Payer: Medicare Other | Admitting: *Deleted

## 2020-02-24 DIAGNOSIS — I4819 Other persistent atrial fibrillation: Secondary | ICD-10-CM

## 2020-02-24 DIAGNOSIS — Z5181 Encounter for therapeutic drug level monitoring: Secondary | ICD-10-CM | POA: Diagnosis not present

## 2020-02-24 DIAGNOSIS — Z953 Presence of xenogenic heart valve: Secondary | ICD-10-CM

## 2020-02-24 LAB — POCT INR: INR: 3.1 — AB (ref 2.0–3.0)

## 2020-02-24 NOTE — Patient Instructions (Signed)
Post DCCV (4th INR) Take warfarin 1 tablet tonight then resume 1 1/2 tablets daily except 2 tablets on Mondays and Thursdays Continue greens/salads Recheck in 2 wk

## 2020-02-25 ENCOUNTER — Ambulatory Visit (HOSPITAL_COMMUNITY): Payer: Medicare Other | Admitting: Nurse Practitioner

## 2020-02-26 ENCOUNTER — Encounter (HOSPITAL_COMMUNITY): Payer: Self-pay | Admitting: Nurse Practitioner

## 2020-02-26 ENCOUNTER — Telehealth: Payer: Self-pay | Admitting: Pharmacist

## 2020-02-26 ENCOUNTER — Ambulatory Visit (HOSPITAL_COMMUNITY)
Admission: RE | Admit: 2020-02-26 | Discharge: 2020-02-26 | Disposition: A | Discharge status: Home / Self Care | Payer: Medicare Other | Source: Ambulatory Visit | Attending: Nurse Practitioner | Admitting: Nurse Practitioner

## 2020-02-26 ENCOUNTER — Telehealth: Payer: Self-pay

## 2020-02-26 ENCOUNTER — Other Ambulatory Visit: Payer: Self-pay

## 2020-02-26 VITALS — BP 152/84 | HR 89 | Ht 61.0 in | Wt 123.0 lb

## 2020-02-26 DIAGNOSIS — I10 Essential (primary) hypertension: Secondary | ICD-10-CM | POA: Diagnosis not present

## 2020-02-26 DIAGNOSIS — Z953 Presence of xenogenic heart valve: Secondary | ICD-10-CM | POA: Diagnosis not present

## 2020-02-26 DIAGNOSIS — I4819 Other persistent atrial fibrillation: Secondary | ICD-10-CM

## 2020-02-26 DIAGNOSIS — I099 Rheumatic heart disease, unspecified: Secondary | ICD-10-CM | POA: Diagnosis not present

## 2020-02-26 DIAGNOSIS — I2510 Atherosclerotic heart disease of native coronary artery without angina pectoris: Secondary | ICD-10-CM | POA: Diagnosis not present

## 2020-02-26 DIAGNOSIS — I4892 Unspecified atrial flutter: Secondary | ICD-10-CM | POA: Diagnosis not present

## 2020-02-26 DIAGNOSIS — E039 Hypothyroidism, unspecified: Secondary | ICD-10-CM | POA: Insufficient documentation

## 2020-02-26 DIAGNOSIS — Z79899 Other long term (current) drug therapy: Secondary | ICD-10-CM | POA: Insufficient documentation

## 2020-02-26 DIAGNOSIS — I480 Paroxysmal atrial fibrillation: Secondary | ICD-10-CM | POA: Diagnosis not present

## 2020-02-26 DIAGNOSIS — D6869 Other thrombophilia: Secondary | ICD-10-CM | POA: Diagnosis not present

## 2020-02-26 DIAGNOSIS — Z87891 Personal history of nicotine dependence: Secondary | ICD-10-CM | POA: Insufficient documentation

## 2020-02-26 DIAGNOSIS — Z7901 Long term (current) use of anticoagulants: Secondary | ICD-10-CM | POA: Insufficient documentation

## 2020-02-26 NOTE — Progress Notes (Signed)
Electrophysiology  Note   Date:  02/26/2020   ID:  Hannah Roy, Hannah Roy 1941/01/20, MRN [id number]   Provider location: [place], [place] [address] Evaluation Performed:f/u   PCP:  Kathyrn Drown, MD  Primary Cardiologist:  Dr. Domenic Polite  Primary Electrophysiologist: Dr. [name]  CC: afib     History of Present Illness: Hannah Roy is a 79 y.o. female wth h/o HTN, rheumatic heart disease, mild CAD, hypothyroidism, who presents for f/u in afib clinic for increased afib/flutter burden following valve surgery last year with MAZE by Dr. Roxy Manns.   She has a h/o of paroxysmal afib first found in a ER visit in 05/10/18.   She was admitted overnight, returned to SR  with  DILT drip and started on  eliquis, CHA2DS2VASc score of 4.  Her echo showed EF of 60-65%, severley dilated left and rt atrial size with myxomatous mitral valve, moderate basal septal hypertrophy, moderate tricuspid regurgitation and moderate to severe aortic regurgitation.  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   She saw Dr. Roxy Manns, 09/15/19 and found to be in atypical atrial flutter and referred here for consideration of cardioversion vrs ablation. Pt is in SR today but had a ER visit 6/23 with atrial flutter and poorly controlled  HTN. meds were adjusted,  BP is stable today. She has increased fatigue during and after arrhythmia spells. Has had covid shots.  F/u in afib clinic,01/27/20. This is a 3 month f/u from  [place]. She has some afib in June and saw me f/u and I referred on to Dr. [name]. She was in SR both of those visits. Dr. [name]  did not believe she would be an ablation candidate but if afib became more frequent,   consideration for  Tikosyn.The pt reports that she has been feeling great but this Saturday did a lot of work in the yard when she became very diaphoretic, lightheaded and short of breath. She  rested all of Sunday and is feeling better but her EKG shows atrial flutter rate controlled.   F/u in afib clinic, 10/29. She is now s/p successful cardioversion and is staying in rhythm,  the shortness of breath is better but she feels weak and lightheaded. She  is in Sinus brady at 48 bpm. She will need reduction of her BB. Continues on warfarin, her  last INR was 3.7. Her warfarin was held for a day.  Today, she denies symptoms of orthopnea, PND, lower extremity edema, claudication,  presyncope, syncope, bleeding, or neurologic sequela. The patient is tolerating medications without difficulties and is otherwise without complaint today.   she denies symptoms of cough, fevers, chills, or new SOB worrisome for COVID 19.    Atrial Fibrillation Risk Factors:  she does not have symptoms or diagnosis of sleep apnea. she does have a history of rheumatic fever. she does not have a history of alcohol use. The patient does not have a history of early familial atrial fibrillation or other arrhythmias.  she has a BMI of Body mass index is 23.24 kg/m.[name] Filed Weights   02/26/20 0849  Weight: 55.8 kg    Past Medical History:  Diagnosis Date  . Aortic atherosclerosis ([place])   . 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 Atypical atrial  flutter ([place])   . Colon polyps   . Essential hypertension   . History of seasonal allergies   . Hypertension   . Hypothyroidism   . Osteopenia 2006  . Persistent atrial fibrillation ([place])   . Rheumatic valvular disease   . 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   21 mm [facility] Ease stented bovine pericardial tissue valve  . S/P ascending aortic aneurysm repair 09/05/2018   28 mm Hemashield platinum supracoronary straight graft  . S/P Maze operation  for atrial fibrillation 09/05/2018   Complete bilateral atrial lesion set using bipolar radiofrequency and cryothermy ablation with clipping of LA appendage  . 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   29 mm [facility] Mitral stented bovine pericardial tissue valve  . 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   28 mm Edwards mc3 ring annuloplasty   Past Surgical History:  Procedure Laterality Date  . AORTIC VALVE REPLACEMENT N/A 09/05/2018   Procedure: AORTIC VALVE REPLACEMENT (AVR) USING MAGNA EASE 21MM AOTIC BIOPROSTHESIS VALVE;  Surgeon: Rexene Alberts, MD;  Location: [place];  Service: Open Heart Surgery;  Laterality: N/A;  . APPENDECTOMY    . BUBBLE STUDY  01/30/2020   Procedure: BUBBLE STUDY;  Surgeon: Elouise Munroe, MD;  Location: [place];  Service: Cardiovascular;;  . CARDIOVERSION N/A 01/30/2020   Procedure: CARDIOVERSION;  Surgeon: Elouise Munroe, MD;  Location: [facility] ENDOSCOPY;  Service: Cardiovascular;  Laterality: N/A;  . CLIPPING OF ATRIAL APPENDAGE  09/05/2018   Procedure: Clipping Of Atrial Appendage;  Surgeon: Rexene Alberts, MD;  Location: [facility] OR;  Service: Open Heart Surgery;;  . COLONOSCOPY  03/23/2011   repeat in 5 years,Procedure: COLONOSCOPY;  Surgeon: Rogene Houston, MD;  [facility];  Service: Endoscopy;  Laterality: N/A;  1:00  . COLONOSCOPY N/A 11/09/2016   Procedure: COLONOSCOPY;  Surgeon: Rogene Houston, MD;  [facility];  Service: Endoscopy;  Laterality: N/A;  1030  . ESOPHAGOGASTRODUODENOSCOPY N/A 10/10/2018   Procedure: ESOPHAGOGASTRODUODENOSCOPY (EGD);  Surgeon: Doran Stabler, MD;  Location: [place];  Service: Gastroenterology;  Laterality: N/A;  . ESOPHAGOGASTRODUODENOSCOPY (EGD) WITH PROPOFOL N/A 10/08/2018   Procedure: ESOPHAGOGASTRODUODENOSCOPY (EGD) WITH PROPOFOL;  Surgeon: Danie Binder, MD;  [facility];  Service: Endoscopy;  Laterality: N/A;  . ESOPHAGOGASTRODUODENOSCOPY  (EGD) WITH PROPOFOL N/A 11/15/2018   Procedure: ESOPHAGOGASTRODUODENOSCOPY (EGD) WITH PROPOFOL;  Surgeon: Rogene Houston, MD;  [facility];  Service: Endoscopy;  Laterality: N/A;  12:55  . HOT HEMOSTASIS N/A 10/10/2018   Procedure: HOT HEMOSTASIS (ARGON PLASMA COAGULATION/BICAP);  Surgeon: Doran Stabler, MD;  Location: [place];  Service: Gastroenterology;  Laterality: N/A;  . IR ANGIOGRAM SELECTIVE EACH ADDITIONAL Roy  10/08/2018  . IR ANGIOGRAM VISCERAL SELECTIVE  10/08/2018  . IR EMBO ART  VEN HEMORR LYMPH EXTRAV  INC GUIDE ROADMAPPING  10/08/2018  . IR US GUIDE VASC ACCESS RIGHT  10/08/2018  . MAZE N/A 09/05/2018   Procedure: MAZE;  Surgeon: Rexene Alberts, MD;  Location: [place];  Service: Open Heart Surgery;  Laterality: N/A;  . MITRAL VALVE REPLACEMENT N/A 09/05/2018   Procedure: MITRAL VALVE (MV) REPLACEMENT USING MAGNA MITRAL EASE 29MM BIOPROSTHESIS VALVE;  Surgeon: Rexene Alberts, MD;  Location: [place];  Service: Open Heart Surgery;  Laterality: N/A;  . REPLACEMENT ASCENDING AORTA N/A 09/05/2018   Procedure: SUPRACORONARY STRAIGHT GRAFT REPLACEMENT OF ASCENDING AORTA;  Surgeon: Rexene Alberts, MD;  Location:  [place] OR;  Service: Open Heart Surgery;  Laterality: N/A;  . RIGHT/LEFT HEART CATH AND CORONARY ANGIOGRAPHY N/A 08/29/2018   Procedure: RIGHT/LEFT HEART CATH AND CORONARY ANGIOGRAPHY;  Surgeon: Lorretta Harp, MD;  Location: [place] CV LAB;  Service: Cardiovascular;  Laterality: N/A;  . TEE WITHOUT CARDIOVERSION N/A 08/29/2018   Procedure: TRANSESOPHAGEAL ECHOCARDIOGRAM (TEE);  Surgeon: Skeet Latch, MD;  Location: [place];  Service: Cardiovascular;  Laterality: N/A;  . TEE WITHOUT CARDIOVERSION N/A 01/30/2020   Procedure: TRANSESOPHAGEAL ECHOCARDIOGRAM (TEE);  Surgeon: Elouise Munroe, MD;  Location: [facility] ENDOSCOPY;  Service: Cardiovascular;  Laterality: N/A;  . TRICUSPID VALVE REPLACEMENT N/A 09/05/2018   Procedure: TRICUSPID VALVE REPAIR USING  EDWARDS MC3 TRICUSPID ANNULOPLASTY RING SIZE T28;  Surgeon: Rexene Alberts, MD;  Location: [place];  Service: Open Heart Surgery;  Laterality: N/A;  . TUBAL LIGATION       Current Outpatient Medications  Medication Sig Dispense Refill  . amLODipine (NORVASC) 2.5 MG tablet Take 1 tablet (2.5 mg total) by mouth daily. (Patient taking differently: Take 2.5 mg by mouth every evening. ) 90 tablet 3  . losartan (COZAAR) 50 MG tablet Take 1 tablet (50 mg total) by mouth daily. 90 tablet 3  . metoprolol tartrate (LOPRESSOR) 25 MG tablet Take 1.5 tablets (37.5 mg total) by mouth 2 (two) times daily. 180 tablet 3  . pantoprazole (PROTONIX) 40 MG tablet Take 40 mg by mouth daily.    . rosuvastatin (CRESTOR) 5 MG tablet TAKE 1 TABLET BY MOUTH ON MONDAY, [facility] AND FRIDAY 36 tablet 1  . warfarin (COUMADIN) 2.5 MG tablet Take 2 tablets daily except 1 1/2 tablets on Sundays, Tuesdays and Thursdays (Patient taking differently: Take 2.5-3.75 mg by mouth See admin instructions. Take 5 mg by mouth on Tuesday and Thursday, take 3.75 mg on Monday, Wednesday, Friday, Saturday and Sunday) 180 tablet 3  . acetaminophen (TYLENOL) 325 MG tablet Take 650 mg by mouth every 6 (six) hours as needed for moderate pain or fever.  (Patient not taking: Reported on 02/06/2020)     No current facility-administered medications for this encounter.    Allergies:   Bee venom, Boniva [ibandronic acid], Lisinopril, and Latex   Social History:  The patient  reports that she has quit smoking. Her smoking use included cigarettes. She has a 5.00 pack-year smoking history. She has never used smokeless tobacco. She reports that she does not drink alcohol and does not use drugs.   Family History:  The patient's  family history includes Colon cancer in her daughter.    ROS:  Please see the history of present illness.   All other systems are personally reviewed and negative.   Exam: Well appearing, alert and conversant, regular work of  breathing,  good skin color  Recent Labs: 06/26/2019: ALT 16 07/03/2019: TSH 3.450 01/27/2020: BUN 16; Creatinine, Ser 0.85; Hemoglobin 13.1; Platelets 201; Potassium 4.4; Sodium 141  personally reviewed    Other studies personally reviewed: Epic records reviewed  Echo-1. The left ventricle has normal systolic function of 10-93%. The cavity size is normal. There is moderate focal basal septal left ventricular wall thickness. Echo evidence of unable to assess due to arrhythmia (atrial fibrillation and/or flutter)  diastolic filling patterns.  2. Severely dilated left atrial size.  3. Moderately dilated right atrial size.  4. The mitral valve is myxomatous. There is mild thickening. Regurgitation is mild to moderate by color flow Doppler.  5. Normal tricuspid valve.  6. Tricuspid regurgitation moderate.  7. The aortic valve tricuspid. There is moderate thickening of the aortic valve. Aortic valve regurgitation is moderate to severe by color flow Doppler. The calculated aortic valve area is 0.99 cm consistent with mild stenosis.  8. Mild dilatation of the aortic root.  9. The inferior vena cava was dilated in size with >50% respiratory variablity. 10. The patient was tachycardic throughout the study with evidence of atrial fibrillation and/or flutter. 11. No atrial level shunt detected by color flow Doppler.  FINDINGS  Left Ventricle: No evidence of left ventricular regional wall motion abnormalities. The left ventricle has normal systolic function of 27-74%. The cavity size is normal. There is moderate focal basal septal left ventricular wall thickness. Echo evidence  of unable to assess due to arrhythmia (atrial fibrillation and/or flutter) diastolic filling patterns. Right Ventricle: The right ventricle is normal in size. There is normal hypertrophy. There is normal systolic function. Left Atrium: The left atrium is severely dilated. Right Atrium: The right atrial size is moderately  dilated. Interatrial Septum: No atrial level shunt detected by color flow Doppler.  Pericardium: There is no evidence of pericardial effusion. Mitral Valve: The mitral valve is myxomatous. There is mild thickening. Regurgitation is mild to moderate by color flow Doppler. Tricuspid Valve: The tricuspid valve is normal in structure. Tricuspid regurgitation moderate by color flow Doppler. Aortic Valve: The aortic valve tricuspid. There is moderate thickening of the aortic valve. Aortic valve regurgitation is moderate to severe by color flow Doppler. The calculated aortic valve area is 0.99 cm consistent with mild stenosis. Pulmonic Valve: The pulmonic valve is grossly normal. Pulmonic valve regurgitation is trivial by color flow Doppler. Aorta: There is mild dilatation of the aortic root. Venous: The inferior vena cava was dilated in size with greater than 50% respiratory variablity. In comparison to the previous echocardiogram(s): There are no prior studies on this patient for comparison purposes. The patient was tachycardic throughout the study with evidence of atrial fibrillation and/or flutter.   Echo-3/21- 1. Left ventricular ejection fraction, by estimation, is 55 to 60%. The  left ventricle has normal function. The left ventricle has no regional  wall motion abnormalities. There is mild concentric left ventricular  hypertrophy. Left ventricular diastolic  parameters are indeterminate.  2. Right ventricular systolic function is mildly reduced. The right  ventricular size is normal. There is normal pulmonary artery systolic  pressure.  3. Left atrial size was severely dilated.  4. Right atrial size was severely dilated.  5. MAGNA MITRAL EASE 29MM BIOPROSTHESIS VALVE is in the MV position. The  mitral valve has been repaired/replaced. No evidence of mitral valve  regurgitation.  6. EDWARDS MC3 TRICUSPID ANNULOPLASTY RING SIZE T28 is present. The  tricuspid valve is has been  repaired/replaced. Tricuspid valve  regurgitation is moderate.  7. MAGNA EASE 21MM AOTIC BIOPROSTHESIc VALVE is in the AV position. The  aortic valve has been repaired/replaced. Aortic valve regurgitation is  mild.  8. The inferior vena cava is normal in size with greater than 50%  respiratory variability, suggesting right atrial pressure of 3 mmHg.      ASSESSMENT AND PLAN:  1. Paroxysmal afib/atrial flutter  S/p Maze 08/2018 Went back into atrial flutter now s/p cardioversion, and staying in Sinus rhythm but  slow at 48 bpm Pt is symptomatic with this Will decrease BB to 25 mg bid  If  ERAF then we will discuss tikosyn  Denies any excessive caffeine, snoring, alcohol use,tobacco use Continue warfarin   This patients  CHA2DS2-VASc Score and unadjusted Ischemic Stroke Rate (% per year) is equal to 4.8 % stroke rate/year from a score of 4  Above score calculated as 1 point each if present [CHF, HTN, DM, Vascular=MI/PAD/Aortic Plaque, Age if 65-74, or Female] Above score calculated as 2 points each if present [Age > 75, or Stroke/TIA/TE]  2.HTN Stable today   3. S/p bioprosthetic aortic valve, mitral valve replacement with tricuspid valve repair with ring annuloplasty with  MAZE and repair of ascending thoracic  aneurysm 08/2018  Per Dr. Roxy Manns     Labs/ tests ordered today include: none Orders Placed This Encounter  Procedures  . EKG 12-Lead  . EKG 12-Lead   F/u in one month  Signed, Roderic Palau NP 02/26/2020 9:00 AM  [facility] [address] [place], [place] [address] [phone]

## 2020-02-26 NOTE — Telephone Encounter (Addendum)
Medication list reviewed in anticipation of upcoming Tikosyn initiation. Patient is not taking any contraindicated or QTc prolonging medications.   Patient is anticoagulated on warfarin (last INR 3.1 on 02/24/20). Paitent will need 4 weekly therapeutic INRs prior to admission. 4th therapeutic INR being the day of admission.  Patient will need to be counseled to avoid use of Benadryl while on Tikosyn and in the 2-3 days prior to Tikosyn initiation.

## 2020-02-26 NOTE — Progress Notes (Signed)
Electrophysiology  Note   Date:  02/26/2020   ID:  Hannah, Roy 1941/03/25, MRN [id number]   Provider location: [place], [place] [address] Evaluation Performed:f/u   PCP:  Kathyrn Drown, MD  Primary Cardiologist:  Dr. Domenic Polite  Primary Electrophysiologist: Dr. [name]  CC: afib     History of Present Illness: Hannah Roy is a 79 y.o. female wth h/o HTN, rheumatic heart disease, mild CAD, hypothyroidism, who presents for f/u in afib clinic for increased afib/flutter burden following valve surgery last year with MAZE by Dr. Roxy Manns.   She has a h/o of paroxysmal afib first found in a ER visit in 05/10/18.   She was admitted overnight, returned to SR  with  DILT drip and started on  eliquis, CHA2DS2VASc score of 4.  Her echo showed EF of 60-65%, severley dilated left and rt atrial size with myxomatous mitral valve, moderate basal septal hypertrophy, moderate tricuspid regurgitation and moderate to severe aortic regurgitation.  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   She saw Dr. Roxy Manns, 09/15/19 and found to be in atypical atrial flutter and referred here for consideration of cardioversion vrs ablation. Pt is in SR today but had a ER visit 6/23 with atrial flutter and poorly controlled  HTN. meds were adjusted,  BP is stable today. She has increased fatigue during and after arrhythmia spells. Has had covid shots.  F/u in afib clinic,01/27/20. This is a 3 month f/u from  [place]. She has some afib in June and saw me f/u and I referred on to Dr. [name]. She was in SR both of those visits. Dr. [name]  did not believe she would be an ablation candidate but if afib became more frequent,   consideration for  Tikosyn.The pt reports that she has been feeling great but this Saturday did a lot of work in the yard when she became very diaphoretic, lightheaded and short of breath. She  rested all of Sunday and is feeling better but her EKG shows atrial flutter rate controlled.   F/u in afib clinic, 10/29. She is now s/p successful cardioversion and is staying in rhythm,  the shortness of breath is better but she feels weak and lightheaded. She  is in Sinus brady at 48 bpm. She will need reduction of her BB. Continues on warfarin, her  last INR was 3.7. Her warfarin was held for a day.  F/u in afib clinic 02/26/20, pt is here for return of irregular rhythm last week. Ekg confirms atypical atrial flutter, rate controlled. She was informed to increase the BB to 37.5 mg bid as she was racing last week. Per Dr. Jackalyn Lombard note 7/8 if persistence of afib, consider Tikosyn or amiodarone. She did not feel she was an ablation candidate for severe bi atrial enlargement. We discussed the pro's/con's of Tikosyn vrs amiodarone and she wants to proceed with Tikosyn after 4 therapeutic INR's.   Today, she denies symptoms of orthopnea, PND, lower extremity edema, claudication,  presyncope, syncope, bleeding, or neurologic sequela. The patient is tolerating medications without difficulties and is otherwise without complaint today.   she denies symptoms of cough, fevers, chills, or new SOB worrisome for COVID 19.    Atrial Fibrillation Risk Factors:  she does not  have symptoms or diagnosis of sleep apnea. she does have a history of rheumatic fever. she does not have a history of alcohol use. The patient does not have a history of early familial atrial fibrillation or other arrhythmias.  she has a BMI of Body mass index is 23.24 kg/m.[name] Filed Weights   02/26/20 0849  Weight: 55.8 kg    Past Medical History:  Diagnosis Date  . Aortic atherosclerosis ([facility])   . 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  Atypical atrial flutter ([place])   . Colon polyps   . Essential hypertension   . History of seasonal allergies   . Hypertension   . Hypothyroidism   . Osteopenia 2006  . Persistent atrial fibrillation ([facility])   . Rheumatic valvular disease   . 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   21 mm [facility] Ease stented bovine pericardial tissue valve  . S/P ascending aortic aneurysm repair 09/05/2018   28 mm Hemashield platinum supracoronary straight graft  . S/P Maze operation for atrial fibrillation 09/05/2018   Complete bilateral atrial lesion set using bipolar radiofrequency and cryothermy ablation with clipping of LA appendage  . 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   29 mm [facility] Mitral stented bovine pericardial tissue valve  . 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   28 mm Edwards mc3 ring annuloplasty   Past Surgical History:  Procedure Laterality Date  . AORTIC VALVE REPLACEMENT N/A 09/05/2018   Procedure: AORTIC VALVE REPLACEMENT (AVR) USING MAGNA EASE 21MM AOTIC BIOPROSTHESIS VALVE;  Surgeon: Rexene Alberts, MD;  Location: [place];  Service: Open Heart Surgery;  Laterality: N/A;  . APPENDECTOMY    . BUBBLE STUDY  01/30/2020   Procedure: BUBBLE STUDY;  Surgeon: Elouise Munroe, MD;  Location: [place];  Service: Cardiovascular;;  . CARDIOVERSION N/A 01/30/2020   Procedure: CARDIOVERSION;  Surgeon: Elouise Munroe, MD;  Location[facility] ENDOSCOPY;  Service: Cardiovascular;  Laterality: N/A;  . CLIPPING OF ATRIAL APPENDAGE  09/05/2018   Procedure: Clipping Of Atrial Appendage;  Surgeon: Rexene Alberts, MD;  Location[facility] OR;  Service: Open Heart Surgery;;  . COLONOSCOPY  03/23/2011   repeat in 5 years,Procedure: COLONOSCOPY;  Surgeon: Rogene Houston, MD;  [facility];  Service: Endoscopy;  Laterality: N/A;  1:00  . COLONOSCOPY N/A 11/09/2016   Procedure: COLONOSCOPY;  Surgeon: Rogene Houston, MD;  [facility];  Service: Endoscopy;  Laterality: N/A;  1030  . ESOPHAGOGASTRODUODENOSCOPY N/A 10/10/2018   Procedure: ESOPHAGOGASTRODUODENOSCOPY (EGD);  Surgeon: Doran Stabler, MD;  Location: [place];  Service: Gastroenterology;  Laterality: N/A;  . ESOPHAGOGASTRODUODENOSCOPY (EGD) WITH PROPOFOL N/A 10/08/2018   Procedure: ESOPHAGOGASTRODUODENOSCOPY (EGD) WITH PROPOFOL;  Surgeon: Danie Binder, MD;  [facility];  Service: Endoscopy;  Laterality: N/A;  . ESOPHAGOGASTRODUODENOSCOPY (EGD) WITH PROPOFOL N/A 11/15/2018   Procedure: ESOPHAGOGASTRODUODENOSCOPY (EGD) WITH PROPOFOL;  Surgeon: Rogene Houston, MD;  [facility];  Service: Endoscopy;  Laterality: N/A;  12:55  . HOT HEMOSTASIS N/A 10/10/2018   Procedure: HOT HEMOSTASIS (ARGON PLASMA COAGULATION/BICAP);  Surgeon: Doran Stabler, MD;  Location: [place];  Service: Gastroenterology;  Laterality: N/A;  . IR ANGIOGRAM SELECTIVE EACH ADDITIONAL VESSEL  10/08/2018  . IR ANGIOGRAM VISCERAL SELECTIVE  10/08/2018  . IR EMBO ART  VEN HEMORR LYMPH EXTRAV  INC GUIDE ROADMAPPING  10/08/2018  . IR US GUIDE VASC ACCESS RIGHT  10/08/2018  . MAZE N/A  09/05/2018   Procedure: MAZE;  Surgeon: Rexene Alberts, MD;  Location: [place];  Service: Open Heart Surgery;  Laterality: N/A;  . MITRAL VALVE REPLACEMENT N/A 09/05/2018   Procedure: MITRAL VALVE (MV) REPLACEMENT USING MAGNA MITRAL EASE 29MM BIOPROSTHESIS VALVE;  Surgeon: Rexene Alberts, MD;  Location: [place];  Service: Open Heart Surgery;  Laterality: N/A;  . REPLACEMENT ASCENDING AORTA N/A 09/05/2018   Procedure: SUPRACORONARY STRAIGHT GRAFT REPLACEMENT OF ASCENDING AORTA;  Surgeon: Rexene Alberts, MD;  Location: [place];  Service: Open Heart Surgery;  Laterality: N/A;  . RIGHT/LEFT HEART CATH AND CORONARY ANGIOGRAPHY N/A 08/29/2018   Procedure: RIGHT/LEFT HEART CATH AND CORONARY ANGIOGRAPHY;  Surgeon: Lorretta Harp, MD;  Location: [place] CV LAB;  Service: Cardiovascular;   Laterality: N/A;  . TEE WITHOUT CARDIOVERSION N/A 08/29/2018   Procedure: TRANSESOPHAGEAL ECHOCARDIOGRAM (TEE);  Surgeon: Skeet Latch, MD;  Location: [place];  Service: Cardiovascular;  Laterality: N/A;  . TEE WITHOUT CARDIOVERSION N/A 01/30/2020   Procedure: TRANSESOPHAGEAL ECHOCARDIOGRAM (TEE);  Surgeon: Elouise Munroe, MD;  Location: [facility] ENDOSCOPY;  Service: Cardiovascular;  Laterality: N/A;  . TRICUSPID VALVE REPLACEMENT N/A 09/05/2018   Procedure: TRICUSPID VALVE REPAIR USING EDWARDS MC3 TRICUSPID ANNULOPLASTY RING SIZE T28;  Surgeon: Rexene Alberts, MD;  Location: [place];  Service: Open Heart Surgery;  Laterality: N/A;  . TUBAL LIGATION       Current Outpatient Medications  Medication Sig Dispense Refill  . amLODipine (NORVASC) 2.5 MG tablet Take 1 tablet (2.5 mg total) by mouth daily. (Patient taking differently: Take 2.5 mg by mouth every evening. ) 90 tablet 3  . losartan (COZAAR) 50 MG tablet Take 1 tablet (50 mg total) by mouth daily. 90 tablet 3  . metoprolol tartrate (LOPRESSOR) 25 MG tablet Take 1.5 tablets (37.5 mg total) by mouth 2 (two) times daily. 180 tablet 3  . pantoprazole (PROTONIX) 40 MG tablet Take 40 mg by mouth daily.    . rosuvastatin (CRESTOR) 5 MG tablet TAKE 1 TABLET BY MOUTH ON MONDAY, [facility] AND FRIDAY 36 tablet 1  . warfarin (COUMADIN) 2.5 MG tablet Take 2 tablets daily except 1 1/2 tablets on Sundays, Tuesdays and Thursdays (Patient taking differently: Take 2.5-3.75 mg by mouth See admin instructions. Take 5 mg by mouth on Tuesday and Thursday, take 3.75 mg on Monday, Wednesday, Friday, Saturday and Sunday) 180 tablet 3  . acetaminophen (TYLENOL) 325 MG tablet Take 650 mg by mouth every 6 (six) hours as needed for moderate pain or fever.  (Patient not taking: Reported on 02/06/2020)     No current facility-administered medications for this encounter.    Allergies:   Bee venom, Boniva [ibandronic acid], Lisinopril, and Latex   Social  History:  The patient  reports that she has quit smoking. Her smoking use included cigarettes. She has a 5.00 pack-year smoking history. She has never used smokeless tobacco. She reports that she does not drink alcohol and does not use drugs.   Family History:  The patient's  family history includes Colon cancer in her daughter.    ROS:  Please see the history of present illness.   All other systems are personally reviewed and negative.   Exam: Well appearing, alert and conversant, regular work of breathing,  good skin color  Recent Labs: 06/26/2019: ALT 16 07/03/2019: TSH 3.450 01/27/2020: BUN 16; Creatinine, Ser 0.85; Hemoglobin 13.1; Platelets 201; Potassium 4.4; Sodium 141  personally reviewed    Other studies personally reviewed: Epic records reviewed  Echo-1. The left ventricle has normal systolic function of 74-08%. The cavity size is normal. There is moderate focal basal septal left ventricular wall thickness. Echo evidence of unable to assess due to arrhythmia (atrial fibrillation and/or flutter)  diastolic filling patterns.  2. Severely dilated left atrial size.  3. Moderately dilated right atrial size.  4. The mitral valve is myxomatous. There is mild thickening. Regurgitation is mild to moderate by color flow Doppler.  5. Normal tricuspid valve.  6. Tricuspid regurgitation moderate.  7. The aortic valve tricuspid. There is moderate thickening of the aortic valve. Aortic valve regurgitation is moderate to severe by color flow Doppler. The calculated aortic valve area is 0.99 cm consistent with mild stenosis.  8. Mild dilatation of the aortic root.  9. The inferior vena cava was dilated in size with >50% respiratory variablity. 10. The patient was tachycardic throughout the study with evidence of atrial fibrillation and/or flutter. 11. No atrial level shunt detected by color flow Doppler.  FINDINGS  Left Ventricle: No evidence of left ventricular regional wall motion  abnormalities. The left ventricle has normal systolic function of 14-48%. The cavity size is normal. There is moderate focal basal septal left ventricular wall thickness. Echo evidence  of unable to assess due to arrhythmia (atrial fibrillation and/or flutter) diastolic filling patterns. Right Ventricle: The right ventricle is normal in size. There is normal hypertrophy. There is normal systolic function. Left Atrium: The left atrium is severely dilated. Right Atrium: The right atrial size is moderately dilated. Interatrial Septum: No atrial level shunt detected by color flow Doppler.  Pericardium: There is no evidence of pericardial effusion. Mitral Valve: The mitral valve is myxomatous. There is mild thickening. Regurgitation is mild to moderate by color flow Doppler. Tricuspid Valve: The tricuspid valve is normal in structure. Tricuspid regurgitation moderate by color flow Doppler. Aortic Valve: The aortic valve tricuspid. There is moderate thickening of the aortic valve. Aortic valve regurgitation is moderate to severe by color flow Doppler. The calculated aortic valve area is 0.99 cm consistent with mild stenosis. Pulmonic Valve: The pulmonic valve is grossly normal. Pulmonic valve regurgitation is trivial by color flow Doppler. Aorta: There is mild dilatation of the aortic root. Venous: The inferior vena cava was dilated in size with greater than 50% respiratory variablity. In comparison to the previous echocardiogram(s): There are no prior studies on this patient for comparison purposes. The patient was tachycardic throughout the study with evidence of atrial fibrillation and/or flutter.   Echo-3/21- 1. Left ventricular ejection fraction, by estimation, is 55 to 60%. The  left ventricle has normal function. The left ventricle has no regional  wall motion abnormalities. There is mild concentric left ventricular  hypertrophy. Left ventricular diastolic  parameters are indeterminate.  2.  Right ventricular systolic function is mildly reduced. The right  ventricular size is normal. There is normal pulmonary artery systolic  pressure.  3. Left atrial size was severely dilated.  4. Right atrial size was severely dilated.  5. MAGNA MITRAL EASE 29MM BIOPROSTHESIS VALVE is in the MV position. The  mitral valve has been repaired/replaced. No evidence of mitral valve  regurgitation.  6. EDWARDS MC3 TRICUSPID ANNULOPLASTY RING SIZE T28 is present. The  tricuspid valve is has been repaired/replaced. Tricuspid valve  regurgitation is moderate.  7. MAGNA EASE 21MM AOTIC BIOPROSTHESIc VALVE is in the AV position. The  aortic valve has been repaired/replaced. Aortic valve regurgitation is  mild.  8. The inferior vena cava is normal in  size with greater than 50%  respiratory variability, suggesting right atrial pressure of 3 mmHg.      ASSESSMENT AND PLAN:  1. Paroxysmal afib/atrial flutter  S/p Maze 08/2018 Went back into atrial flutter in October, S/p successful cardioversion, but now ERAF  Pt is symptomatic with this Pt opts for tikosyn admit  Qt runs 430-450 in SR with a RBBB Crcl cal off last creatinine in Epic is 48.62, indicating that she would qualify for 250 mcg bid of Tikosyn No benadryl Will check  on cost of drug  Continue BB at 37.5 mg bid for rate control, she in slow in SR so this will need adjustment on return to SR Denies any excessive caffeine, snoring, alcohol use,tobacco use Continue warfarin, has had 2 therapeutic weekly INR's, will let Lattie Haw in  [place] coumadin clinic  know that we will need weekly INR's until admission for tikosyn 12/14  This patients CHA2DS2-VASc Score and unadjusted Ischemic Stroke Rate (% per year) is equal to 4.8 % stroke rate/year from a score of 4  Above score calculated as 1 point each if present [CHF, HTN, DM, Vascular=MI/PAD/Aortic Plaque, Age if 65-74, or Female] Above score calculated as 2 points each if present [Age > 75, or  Stroke/TIA/TE]  2.HTN Had low BP's yesterday Will check BP before pm amlodipine and am losartan, may need to hold pm amlodipine id continues to be low, ok in clinic today    3. S/p bioprosthetic aortic valve, mitral valve replacement with tricuspid valve repair with ring annuloplasty with  MAZE and repair of ascending thoracic  aneurysm 08/2018  Per Dr. Roxy Manns     Labs/ tests ordered today include: none Orders Placed This Encounter  Procedures  . EKG 12-Lead  . EKG 12-Lead   F/u for tikosyn admission 11/18  SignedRoderic Palau NP 02/26/2020 9:33 AM  [facility] [address] [place], [place] [address] [phone]

## 2020-02-26 NOTE — Telephone Encounter (Signed)
Please see other encounter from today, this encounter created in error

## 2020-02-26 NOTE — Patient Instructions (Signed)
Continue weekly INR checks until admission on 12/14  Check your blood pressure prior to evening dose of amlodipine if the top number is less than 110 hold that dose of amlodipine.

## 2020-03-01 ENCOUNTER — Encounter: Payer: Self-pay | Admitting: *Deleted

## 2020-03-01 ENCOUNTER — Ambulatory Visit (INDEPENDENT_AMBULATORY_CARE_PROVIDER_SITE_OTHER): Payer: Medicare Other | Admitting: *Deleted

## 2020-03-01 DIAGNOSIS — I4819 Other persistent atrial fibrillation: Secondary | ICD-10-CM

## 2020-03-01 DIAGNOSIS — Z953 Presence of xenogenic heart valve: Secondary | ICD-10-CM | POA: Diagnosis not present

## 2020-03-01 DIAGNOSIS — Z5181 Encounter for therapeutic drug level monitoring: Secondary | ICD-10-CM | POA: Diagnosis not present

## 2020-03-01 DIAGNOSIS — I4891 Unspecified atrial fibrillation: Secondary | ICD-10-CM

## 2020-03-01 LAB — POCT INR: INR: 2.9 (ref 2.0–3.0)

## 2020-03-01 NOTE — Telephone Encounter (Signed)
Weekly INR appointments made for Tikosyn admission.

## 2020-03-01 NOTE — Patient Instructions (Signed)
Post D[facility]V (5th INR)  Pending Tikosyn admission 03/23/20 Continue warfarin 1 1/2 tablets daily except 2 tablets on Mondays and Thursdays Continue greens/salads Recheck in 1 wk

## 2020-03-02 ENCOUNTER — Telehealth: Payer: Self-pay

## 2020-03-02 NOTE — Telephone Encounter (Signed)
Pt dropped off Disability Parking Placard to be filled out placed in Dr Nicki Reaper folder.  Pt call back [phone]

## 2020-03-04 ENCOUNTER — Emergency Department (HOSPITAL_COMMUNITY): Payer: No Typology Code available for payment source

## 2020-03-04 ENCOUNTER — Inpatient Hospital Stay (HOSPITAL_COMMUNITY)
Admission: EM | Admit: 2020-03-04 | Discharge: 2020-03-13 | DRG: 083 | Disposition: A | Discharge status: Rehabilitation Facility | Payer: No Typology Code available for payment source | Attending: General Surgery | Admitting: General Surgery

## 2020-03-04 DIAGNOSIS — D696 Thrombocytopenia, unspecified: Secondary | ICD-10-CM | POA: Diagnosis present

## 2020-03-04 DIAGNOSIS — S20212D Contusion of left front wall of thorax, subsequent encounter: Secondary | ICD-10-CM | POA: Diagnosis not present

## 2020-03-04 DIAGNOSIS — D62 Acute posthemorrhagic anemia: Secondary | ICD-10-CM | POA: Diagnosis present

## 2020-03-04 DIAGNOSIS — Z888 Allergy status to other drugs, medicaments and biological substances status: Secondary | ICD-10-CM | POA: Diagnosis not present

## 2020-03-04 DIAGNOSIS — S066X9A Traumatic subarachnoid hemorrhage with loss of consciousness of unspecified duration, initial encounter: Secondary | ICD-10-CM | POA: Diagnosis not present

## 2020-03-04 DIAGNOSIS — H748X2 Other specified disorders of left middle ear and mastoid: Secondary | ICD-10-CM | POA: Diagnosis not present

## 2020-03-04 DIAGNOSIS — S12500A Unspecified displaced fracture of sixth cervical vertebra, initial encounter for closed fracture: Secondary | ICD-10-CM | POA: Diagnosis not present

## 2020-03-04 DIAGNOSIS — T07XXXA Unspecified multiple injuries, initial encounter: Secondary | ICD-10-CM

## 2020-03-04 DIAGNOSIS — I483 Typical atrial flutter: Secondary | ICD-10-CM | POA: Diagnosis not present

## 2020-03-04 DIAGNOSIS — S20212A Contusion of left front wall of thorax, initial encounter: Secondary | ICD-10-CM

## 2020-03-04 DIAGNOSIS — S069X9A Unspecified intracranial injury with loss of consciousness of unspecified duration, initial encounter: Secondary | ICD-10-CM | POA: Diagnosis not present

## 2020-03-04 DIAGNOSIS — Y92410 Unspecified street and highway as the place of occurrence of the external cause: Secondary | ICD-10-CM

## 2020-03-04 DIAGNOSIS — S065X9D Traumatic subdural hemorrhage with loss of consciousness of unspecified duration, subsequent encounter: Secondary | ICD-10-CM | POA: Diagnosis not present

## 2020-03-04 DIAGNOSIS — R413 Other amnesia: Secondary | ICD-10-CM | POA: Diagnosis present

## 2020-03-04 DIAGNOSIS — S12600A Unspecified displaced fracture of seventh cervical vertebra, initial encounter for closed fracture: Secondary | ICD-10-CM | POA: Diagnosis present

## 2020-03-04 DIAGNOSIS — S20219A Contusion of unspecified front wall of thorax, initial encounter: Secondary | ICD-10-CM | POA: Diagnosis not present

## 2020-03-04 DIAGNOSIS — I618 Other nontraumatic intracerebral hemorrhage: Secondary | ICD-10-CM | POA: Diagnosis not present

## 2020-03-04 DIAGNOSIS — I10 Essential (primary) hypertension: Secondary | ICD-10-CM | POA: Diagnosis not present

## 2020-03-04 DIAGNOSIS — S2242XD Multiple fractures of ribs, left side, subsequent encounter for fracture with routine healing: Secondary | ICD-10-CM | POA: Diagnosis not present

## 2020-03-04 DIAGNOSIS — Z7901 Long term (current) use of anticoagulants: Secondary | ICD-10-CM

## 2020-03-04 DIAGNOSIS — Z8719 Personal history of other diseases of the digestive system: Secondary | ICD-10-CM | POA: Diagnosis not present

## 2020-03-04 DIAGNOSIS — S8001XA Contusion of right knee, initial encounter: Secondary | ICD-10-CM | POA: Diagnosis present

## 2020-03-04 DIAGNOSIS — J302 Other seasonal allergic rhinitis: Secondary | ICD-10-CM | POA: Diagnosis present

## 2020-03-04 DIAGNOSIS — M542 Cervicalgia: Secondary | ICD-10-CM | POA: Diagnosis not present

## 2020-03-04 DIAGNOSIS — R0902 Hypoxemia: Secondary | ICD-10-CM | POA: Diagnosis not present

## 2020-03-04 DIAGNOSIS — J439 Emphysema, unspecified: Secondary | ICD-10-CM | POA: Diagnosis not present

## 2020-03-04 DIAGNOSIS — R079 Chest pain, unspecified: Secondary | ICD-10-CM | POA: Diagnosis not present

## 2020-03-04 DIAGNOSIS — S301XXA Contusion of abdominal wall, initial encounter: Secondary | ICD-10-CM | POA: Diagnosis present

## 2020-03-04 DIAGNOSIS — S065X9A Traumatic subdural hemorrhage with loss of consciousness of unspecified duration, initial encounter: Secondary | ICD-10-CM | POA: Diagnosis not present

## 2020-03-04 DIAGNOSIS — Z953 Presence of xenogenic heart valve: Secondary | ICD-10-CM | POA: Diagnosis not present

## 2020-03-04 DIAGNOSIS — R0689 Other abnormalities of breathing: Secondary | ICD-10-CM | POA: Diagnosis not present

## 2020-03-04 DIAGNOSIS — S8000XA Contusion of unspecified knee, initial encounter: Secondary | ICD-10-CM | POA: Diagnosis not present

## 2020-03-04 DIAGNOSIS — I4892 Unspecified atrial flutter: Secondary | ICD-10-CM | POA: Diagnosis present

## 2020-03-04 DIAGNOSIS — Z91030 Bee allergy status: Secondary | ICD-10-CM | POA: Diagnosis not present

## 2020-03-04 DIAGNOSIS — S12590A Other displaced fracture of sixth cervical vertebra, initial encounter for closed fracture: Secondary | ICD-10-CM | POA: Diagnosis not present

## 2020-03-04 DIAGNOSIS — M25562 Pain in left knee: Secondary | ICD-10-CM | POA: Diagnosis not present

## 2020-03-04 DIAGNOSIS — I4819 Other persistent atrial fibrillation: Secondary | ICD-10-CM | POA: Diagnosis present

## 2020-03-04 DIAGNOSIS — D689 Coagulation defect, unspecified: Secondary | ICD-10-CM | POA: Diagnosis present

## 2020-03-04 DIAGNOSIS — F419 Anxiety disorder, unspecified: Secondary | ICD-10-CM | POA: Diagnosis present

## 2020-03-04 DIAGNOSIS — S12690A Other displaced fracture of seventh cervical vertebra, initial encounter for closed fracture: Secondary | ICD-10-CM | POA: Diagnosis not present

## 2020-03-04 DIAGNOSIS — I517 Cardiomegaly: Secondary | ICD-10-CM | POA: Diagnosis not present

## 2020-03-04 DIAGNOSIS — S2249XS Multiple fractures of ribs, unspecified side, sequela: Secondary | ICD-10-CM | POA: Diagnosis not present

## 2020-03-04 DIAGNOSIS — S2242XA Multiple fractures of ribs, left side, initial encounter for closed fracture: Secondary | ICD-10-CM | POA: Diagnosis not present

## 2020-03-04 DIAGNOSIS — Z87891 Personal history of nicotine dependence: Secondary | ICD-10-CM

## 2020-03-04 DIAGNOSIS — R0789 Other chest pain: Secondary | ICD-10-CM | POA: Diagnosis not present

## 2020-03-04 DIAGNOSIS — J90 Pleural effusion, not elsewhere classified: Secondary | ICD-10-CM | POA: Diagnosis not present

## 2020-03-04 DIAGNOSIS — S8001XD Contusion of right knee, subsequent encounter: Secondary | ICD-10-CM | POA: Diagnosis not present

## 2020-03-04 DIAGNOSIS — Z79899 Other long term (current) drug therapy: Secondary | ICD-10-CM

## 2020-03-04 DIAGNOSIS — I6201 Nontraumatic acute subdural hemorrhage: Secondary | ICD-10-CM | POA: Diagnosis not present

## 2020-03-04 DIAGNOSIS — S065XAA Traumatic subdural hemorrhage with loss of consciousness status unknown, initial encounter: Secondary | ICD-10-CM | POA: Diagnosis present

## 2020-03-04 DIAGNOSIS — Z20822 Contact with and (suspected) exposure to covid-19: Secondary | ICD-10-CM | POA: Diagnosis present

## 2020-03-04 DIAGNOSIS — S0993XA Unspecified injury of face, initial encounter: Secondary | ICD-10-CM | POA: Diagnosis not present

## 2020-03-04 DIAGNOSIS — S129XXS Fracture of neck, unspecified, sequela: Secondary | ICD-10-CM | POA: Diagnosis not present

## 2020-03-04 DIAGNOSIS — S065X0A Traumatic subdural hemorrhage without loss of consciousness, initial encounter: Secondary | ICD-10-CM | POA: Diagnosis not present

## 2020-03-04 DIAGNOSIS — S069X1S Unspecified intracranial injury with loss of consciousness of 30 minutes or less, sequela: Secondary | ICD-10-CM | POA: Diagnosis not present

## 2020-03-04 DIAGNOSIS — R000 Tachycardia, unspecified: Secondary | ICD-10-CM | POA: Diagnosis not present

## 2020-03-04 DIAGNOSIS — R600 Localized edema: Secondary | ICD-10-CM | POA: Diagnosis not present

## 2020-03-04 DIAGNOSIS — S8001XS Contusion of right knee, sequela: Secondary | ICD-10-CM | POA: Diagnosis not present

## 2020-03-04 DIAGNOSIS — M25561 Pain in right knee: Secondary | ICD-10-CM | POA: Diagnosis not present

## 2020-03-04 DIAGNOSIS — G9389 Other specified disorders of brain: Secondary | ICD-10-CM | POA: Diagnosis not present

## 2020-03-04 DIAGNOSIS — I480 Paroxysmal atrial fibrillation: Secondary | ICD-10-CM | POA: Diagnosis not present

## 2020-03-04 DIAGNOSIS — S12601A Unspecified nondisplaced fracture of seventh cervical vertebra, initial encounter for closed fracture: Secondary | ICD-10-CM | POA: Diagnosis not present

## 2020-03-04 DIAGNOSIS — S22061D Stable burst fracture of T7-T8 vertebra, subsequent encounter for fracture with routine healing: Secondary | ICD-10-CM | POA: Diagnosis not present

## 2020-03-04 DIAGNOSIS — I4891 Unspecified atrial fibrillation: Secondary | ICD-10-CM | POA: Diagnosis not present

## 2020-03-04 DIAGNOSIS — D649 Anemia, unspecified: Secondary | ICD-10-CM | POA: Diagnosis not present

## 2020-03-04 DIAGNOSIS — R102 Pelvic and perineal pain: Secondary | ICD-10-CM | POA: Diagnosis not present

## 2020-03-04 DIAGNOSIS — R0602 Shortness of breath: Secondary | ICD-10-CM | POA: Diagnosis not present

## 2020-03-04 DIAGNOSIS — R52 Pain, unspecified: Secondary | ICD-10-CM | POA: Diagnosis not present

## 2020-03-04 DIAGNOSIS — Z91040 Latex allergy status: Secondary | ICD-10-CM

## 2020-03-04 DIAGNOSIS — S066X0A Traumatic subarachnoid hemorrhage without loss of consciousness, initial encounter: Secondary | ICD-10-CM | POA: Diagnosis not present

## 2020-03-04 DIAGNOSIS — S2232XA Fracture of one rib, left side, initial encounter for closed fracture: Secondary | ICD-10-CM

## 2020-03-04 LAB — COMPREHENSIVE METABOLIC PANEL
ALT: 18 U/L (ref 0–44)
AST: 25 U/L (ref 15–41)
Albumin: 4.2 g/dL (ref 3.5–5.0)
Alkaline Phosphatase: 60 U/L (ref 38–126)
Anion gap: 12 (ref 5–15)
BUN: 20 mg/dL (ref 8–23)
CO2: 23 mmol/L (ref 22–32)
Calcium: 9.4 mg/dL (ref 8.9–10.3)
Chloride: 104 mmol/L (ref 98–111)
Creatinine, Ser: 1.21 mg/dL — ABNORMAL HIGH (ref 0.44–1.00)
GFR, Estimated: 46 mL/min — ABNORMAL LOW (ref >60)
Glucose, Bld: 107 mg/dL — ABNORMAL HIGH (ref 70–99)
Potassium: 3.9 mmol/L (ref 3.5–5.1)
Sodium: 139 mmol/L (ref 135–145)
Total Bilirubin: 1.1 mg/dL (ref 0.3–1.2)
Total Protein: 7.4 g/dL (ref 6.5–8.1)

## 2020-03-04 LAB — PROTIME-INR
INR: 2.5 — ABNORMAL HIGH (ref 0.8–1.2)
Prothrombin Time: 26.3 seconds — ABNORMAL HIGH (ref 11.4–15.2)

## 2020-03-04 LAB — I-STAT CHEM 8, ED
BUN: 22 mg/dL (ref 8–23)
Calcium, Ion: 1.05 mmol/L — ABNORMAL LOW (ref 1.15–1.40)
Chloride: 109 mmol/L (ref 98–111)
Creatinine, Ser: 1.1 mg/dL — ABNORMAL HIGH (ref 0.44–1.00)
Glucose, Bld: 103 mg/dL — ABNORMAL HIGH (ref 70–99)
HCT: 36 % (ref 36.0–46.0)
Hemoglobin: 12.2 g/dL (ref 12.0–15.0)
Potassium: 3.9 mmol/L (ref 3.5–5.1)
Sodium: 142 mmol/L (ref 135–145)
TCO2: 21 mmol/L — ABNORMAL LOW (ref 22–32)

## 2020-03-04 LAB — RESP PANEL BY RT-PCR (FLU A&B, COVID) ARPGX2
Influenza A by PCR: NEGATIVE
Influenza B by PCR: NEGATIVE
SARS Coronavirus 2 by RT PCR: NEGATIVE

## 2020-03-04 LAB — URINALYSIS, ROUTINE W REFLEX MICROSCOPIC
Bacteria, UA: NONE SEEN
Bilirubin Urine: NEGATIVE
Glucose, UA: NEGATIVE mg/dL
Ketones, ur: NEGATIVE mg/dL
Leukocytes,Ua: NEGATIVE
Nitrite: NEGATIVE
Protein, ur: NEGATIVE mg/dL
Specific Gravity, Urine: 1.012 (ref 1.005–1.030)
pH: 7 (ref 5.0–8.0)

## 2020-03-04 LAB — CBC
HCT: 38.2 % (ref 36.0–46.0)
Hemoglobin: 12.3 g/dL (ref 12.0–15.0)
MCH: 30.4 pg (ref 26.0–34.0)
MCHC: 32.2 g/dL (ref 30.0–36.0)
MCV: 94.6 fL (ref 80.0–100.0)
Platelets: 170 10*3/uL (ref 150–400)
RBC: 4.04 MIL/uL (ref 3.87–5.11)
RDW: 12 % (ref 11.5–15.5)
WBC: 7 10*3/uL (ref 4.0–10.5)
nRBC: 0 % (ref 0.0–0.2)

## 2020-03-04 LAB — SAMPLE TO BLOOD BANK

## 2020-03-04 LAB — LACTIC ACID, PLASMA: Lactic Acid, Venous: 1.3 mmol/L (ref 0.5–1.9)

## 2020-03-04 LAB — MRSA PCR SCREENING: MRSA by PCR: NEGATIVE

## 2020-03-04 MED ORDER — SODIUM CHLORIDE 0.9 % IV SOLN: INTRAVENOUS | Status: DC | PRN

## 2020-03-04 MED ORDER — ACETAMINOPHEN 325 MG PO TABS
650.0000 mg | ORAL_TABLET | ORAL | Status: DC | PRN
  Administered 2020-03-04 – 2020-03-08 (×4): 650 mg via ORAL
  Filled 2020-03-04 (×4): qty 2

## 2020-03-04 MED ORDER — METOPROLOL TARTRATE 25 MG PO TABS
37.5000 mg | ORAL_TABLET | Freq: Two times a day (BID) | ORAL | Status: DC
  Administered 2020-03-04 – 2020-03-13 (×17): 37.5 mg via ORAL
  Filled 2020-03-04 (×4): qty 1
  Filled 2020-03-04: qty 2
  Filled 2020-03-04 (×3): qty 1
  Filled 2020-03-04 (×3): qty 2
  Filled 2020-03-04: qty 1
  Filled 2020-03-04: qty 2
  Filled 2020-03-04 (×3): qty 1
  Filled 2020-03-04 (×2): qty 2

## 2020-03-04 MED ORDER — FENTANYL CITRATE (PF) 100 MCG/2ML IJ SOLN
25.0000 ug | Freq: Once | INTRAMUSCULAR | Status: AC
  Administered 2020-03-04: 25 ug via INTRAVENOUS
  Filled 2020-03-04: qty 2

## 2020-03-04 MED ORDER — ONDANSETRON HCL 4 MG/2ML IJ SOLN
4.0000 mg | Freq: Four times a day (QID) | INTRAMUSCULAR | Status: DC | PRN
  Administered 2020-03-06: 4 mg via INTRAVENOUS
  Filled 2020-03-04: qty 2

## 2020-03-04 MED ORDER — MORPHINE SULFATE (PF) 4 MG/ML IV SOLN
4.0000 mg | INTRAVENOUS | Status: DC | PRN
  Administered 2020-03-06: 4 mg via INTRAVENOUS
  Filled 2020-03-04: qty 1

## 2020-03-04 MED ORDER — SODIUM CHLORIDE 0.9 % IV BOLUS
500.0000 mL | Freq: Once | INTRAVENOUS | Status: AC
  Administered 2020-03-04: 500 mL via INTRAVENOUS

## 2020-03-04 MED ORDER — POTASSIUM CHLORIDE IN NACL 20-0.9 MEQ/L-% IV SOLN
INTRAVENOUS | Status: DC
  Filled 2020-03-04 (×5): qty 1000

## 2020-03-04 MED ORDER — ONDANSETRON HCL 4 MG/2ML IJ SOLN
4.0000 mg | Freq: Once | INTRAMUSCULAR | Status: AC
  Administered 2020-03-04: 4 mg via INTRAVENOUS
  Filled 2020-03-04: qty 2

## 2020-03-04 MED ORDER — ONDANSETRON 4 MG PO TBDP
4.0000 mg | ORAL_TABLET | Freq: Four times a day (QID) | ORAL | Status: DC | PRN
  Administered 2020-03-05: 4 mg via ORAL
  Filled 2020-03-04: qty 1

## 2020-03-04 MED ORDER — LEVETIRACETAM IN NACL 500 MG/100ML IV SOLN
500.0000 mg | Freq: Two times a day (BID) | INTRAVENOUS | Status: DC
  Administered 2020-03-04 – 2020-03-07 (×6): 500 mg via INTRAVENOUS
  Filled 2020-03-04 (×6): qty 100

## 2020-03-04 MED ORDER — PANTOPRAZOLE SODIUM 40 MG PO TBEC
40.0000 mg | DELAYED_RELEASE_TABLET | Freq: Every day | ORAL | Status: DC
  Administered 2020-03-05 – 2020-03-13 (×9): 40 mg via ORAL
  Filled 2020-03-04 (×9): qty 1

## 2020-03-04 MED ORDER — LOSARTAN POTASSIUM 50 MG PO TABS
50.0000 mg | ORAL_TABLET | Freq: Every day | ORAL | Status: DC
  Administered 2020-03-05 – 2020-03-08 (×2): 50 mg via ORAL
  Filled 2020-03-04 (×2): qty 1

## 2020-03-04 MED ORDER — MORPHINE SULFATE (PF) 2 MG/ML IV SOLN: 2.0000 mg | INTRAVENOUS | Status: DC | PRN

## 2020-03-04 MED ORDER — METHOCARBAMOL 1000 MG/10ML IJ SOLN
1000.0000 mg | Freq: Three times a day (TID) | INTRAVENOUS | Status: DC | PRN
  Filled 2020-03-04: qty 10

## 2020-03-04 MED ORDER — FENTANYL CITRATE (PF) 100 MCG/2ML IJ SOLN
50.0000 ug | Freq: Once | INTRAMUSCULAR | Status: AC
  Administered 2020-03-04: 50 ug via INTRAVENOUS
  Filled 2020-03-04: qty 2

## 2020-03-04 MED ORDER — AMLODIPINE BESYLATE 2.5 MG PO TABS
2.5000 mg | ORAL_TABLET | Freq: Every evening | ORAL | Status: DC
  Administered 2020-03-04 – 2020-03-12 (×8): 2.5 mg via ORAL
  Filled 2020-03-04 (×8): qty 1

## 2020-03-04 MED ORDER — IOHEXOL 300 MG/ML  SOLN
100.0000 mL | Freq: Once | INTRAMUSCULAR | Status: AC | PRN
  Administered 2020-03-04: 100 mL via INTRAVENOUS

## 2020-03-04 NOTE — ED Notes (Signed)
C-collar switched out to [facility], maintaining C-spine at all times

## 2020-03-04 NOTE — ED Notes (Signed)
Neurology at bedside.
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
## 2020-03-04 NOTE — ED Notes (Signed)
Pt's daughter called and updated about pt's POC
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
## 2020-03-04 NOTE — ED Notes (Signed)
Dr. Thompson at bedside. 

## 2020-03-04 NOTE — Progress Notes (Signed)
   03/04/20 1443  Clinical Encounter Type  Visited With Patient and family together  Visit Type Trauma  Referral From Other (Comment) (Level 2 Page)  Consult/Referral To Chaplain  Chaplain responded to Level 2 Page.  Family member was at bedside, Pt was stable and Chaplain was not needed.  Chaplain Meriem Lemieux [place]

## 2020-03-04 NOTE — ED Notes (Signed)
Ct notified pt is ready for transport. CT not ready for pt at this time
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
## 2020-03-05 ENCOUNTER — Inpatient Hospital Stay (HOSPITAL_COMMUNITY): Payer: No Typology Code available for payment source

## 2020-03-05 LAB — BASIC METABOLIC PANEL
Anion gap: 12 (ref 5–15)
BUN: 17 mg/dL (ref 8–23)
CO2: 21 mmol/L — ABNORMAL LOW (ref 22–32)
Calcium: 8.5 mg/dL — ABNORMAL LOW (ref 8.9–10.3)
Chloride: 107 mmol/L (ref 98–111)
Creatinine, Ser: 0.91 mg/dL (ref 0.44–1.00)
GFR, Estimated: >60 mL/min (ref >60)
Glucose, Bld: 113 mg/dL — ABNORMAL HIGH (ref 70–99)
Potassium: 3.8 mmol/L (ref 3.5–5.1)
Sodium: 140 mmol/L (ref 135–145)

## 2020-03-05 LAB — CBC
HCT: 29.9 % — ABNORMAL LOW (ref 36.0–46.0)
Hemoglobin: 9.8 g/dL — ABNORMAL LOW (ref 12.0–15.0)
MCH: 31 pg (ref 26.0–34.0)
MCHC: 32.8 g/dL (ref 30.0–36.0)
MCV: 94.6 fL (ref 80.0–100.0)
Platelets: 141 10*3/uL — ABNORMAL LOW (ref 150–400)
RBC: 3.16 MIL/uL — ABNORMAL LOW (ref 3.87–5.11)
RDW: 12.1 % (ref 11.5–15.5)
WBC: 6.2 10*3/uL (ref 4.0–10.5)
nRBC: 0 % (ref 0.0–0.2)

## 2020-03-05 LAB — PROTIME-INR
INR: 2.6 — ABNORMAL HIGH (ref 0.8–1.2)
Prothrombin Time: 27 seconds — ABNORMAL HIGH (ref 11.4–15.2)

## 2020-03-05 LAB — ETHANOL: Alcohol, Ethyl (B): <10 mg/dL (ref <10)

## 2020-03-05 MED ORDER — CHLORHEXIDINE GLUCONATE CLOTH 2 % EX PADS
6.0000 | MEDICATED_PAD | Freq: Every day | CUTANEOUS | Status: DC
  Administered 2020-03-05 – 2020-03-12 (×7): 6 via TOPICAL

## 2020-03-05 MED ORDER — OXYCODONE HCL 5 MG PO TABS
5.0000 mg | ORAL_TABLET | Freq: Four times a day (QID) | ORAL | Status: DC | PRN
  Administered 2020-03-05 – 2020-03-08 (×11): 5 mg via ORAL
  Filled 2020-03-05 (×11): qty 1

## 2020-03-05 NOTE — Progress Notes (Signed)
Evaluated patient. Alert and oriented. No new symptoms. -clear liquids -oral pain medication -PT consult, ambulate with assist

## 2020-03-05 NOTE — Progress Notes (Signed)
CT reviewed. Dr Saintclair Halsted examined the pt and she is stable and awake. 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 Risk of reversal outweighs risk of SDH expansion to critical point I think.

## 2020-03-05 NOTE — Telephone Encounter (Signed)
This was filled out Please fill in the administrative portion Please note patient currently in the hospital due to [place]

## 2020-03-05 NOTE — TOC CAGE-AID Note (Signed)
Transition of Care [facility]) - CAGE-AID Screening   Patient Details  Name: Hannah Roy MRN: [id number] Date of Birth: 1941/02/08  [facility]) CM/SW Contact:    Emeterio Reeve, [place] Phone Number: 03/05/2020, 3:20 PM   Clinical Narrative:  CSW met with pt at bedside. CSW introduced self and explained role at the hospital.  Pt denies alcohol use. Pt denies substance use. Pt did not need any resources at this time.    CAGE-AID Screening:    Have You Ever Felt You Ought to Cut Down on Your Drinking or Drug Use?: No Have People Annoyed You By Critizing Your Drinking Or Drug Use?: No Have You Felt Bad Or Guilty About Your Drinking Or Drug Use?: No Have You Ever Had a Drink or Used Drugs First Thing In The Morning to Steady Your Nerves or to Get Rid of a Hangover?: No CAGE-AID Score: 0  Substance Abuse Education Offered: Yes    Blima Ledger, [place] Social Worker [phone]

## 2020-03-05 NOTE — Progress Notes (Signed)
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 repeat CT insert shows slight increase although still no midline shift minimal mass-effect.  Patient neurologically stable will continue to let her drift back without active reversal from her coagulopathy repeat CT scan in the morning.  LOS: 1 day     Elaina Hoops 03/05/2020, 8:32 AM

## 2020-03-06 ENCOUNTER — Inpatient Hospital Stay (HOSPITAL_COMMUNITY): Payer: No Typology Code available for payment source

## 2020-03-06 LAB — BASIC METABOLIC PANEL
Anion gap: 9 (ref 5–15)
BUN: 13 mg/dL (ref 8–23)
CO2: 26 mmol/L (ref 22–32)
Calcium: 8.3 mg/dL — ABNORMAL LOW (ref 8.9–10.3)
Chloride: 104 mmol/L (ref 98–111)
Creatinine, Ser: 0.83 mg/dL (ref 0.44–1.00)
GFR, Estimated: >60 mL/min (ref >60)
Glucose, Bld: 107 mg/dL — ABNORMAL HIGH (ref 70–99)
Potassium: 3.6 mmol/L (ref 3.5–5.1)
Sodium: 139 mmol/L (ref 135–145)

## 2020-03-06 LAB — CBC
HCT: 25.8 % — ABNORMAL LOW (ref 36.0–46.0)
Hemoglobin: 8.4 g/dL — ABNORMAL LOW (ref 12.0–15.0)
MCH: 30.8 pg (ref 26.0–34.0)
MCHC: 32.6 g/dL (ref 30.0–36.0)
MCV: 94.5 fL (ref 80.0–100.0)
Platelets: 127 10*3/uL — ABNORMAL LOW (ref 150–400)
RBC: 2.73 MIL/uL — ABNORMAL LOW (ref 3.87–5.11)
RDW: 12.3 % (ref 11.5–15.5)
WBC: 6.2 10*3/uL (ref 4.0–10.5)
nRBC: 0 % (ref 0.0–0.2)

## 2020-03-06 LAB — PROTIME-INR
INR: 2.6 — ABNORMAL HIGH (ref 0.8–1.2)
Prothrombin Time: 26.9 seconds — ABNORMAL HIGH (ref 11.4–15.2)

## 2020-03-06 MED ORDER — WHITE PETROLATUM EX OINT
TOPICAL_OINTMENT | CUTANEOUS | Status: AC
  Filled 2020-03-06: qty 28.35
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
## 2020-03-06 NOTE — Progress Notes (Signed)
She is awake and alert and interactive.  There is no change neurologically.  She is moving all extremities.  Her CT scan shows lots of stability of subdural hematoma, read by the radiologist as a millimeter or 2 smaller today.  Continue current supportive management.  Continue anticoagulant for mitral and aortic mechanical valves, likely, scans and neurologic exam have been stable

## 2020-03-07 LAB — BASIC METABOLIC PANEL
Anion gap: 8 (ref 5–15)
BUN: 10 mg/dL (ref 8–23)
CO2: 24 mmol/L (ref 22–32)
Calcium: 7.9 mg/dL — ABNORMAL LOW (ref 8.9–10.3)
Chloride: 104 mmol/L (ref 98–111)
Creatinine, Ser: 0.81 mg/dL (ref 0.44–1.00)
GFR, Estimated: >60 mL/min (ref >60)
Glucose, Bld: 114 mg/dL — ABNORMAL HIGH (ref 70–99)
Potassium: 3.6 mmol/L (ref 3.5–5.1)
Sodium: 136 mmol/L (ref 135–145)

## 2020-03-07 LAB — CBC
HCT: 24.6 % — ABNORMAL LOW (ref 36.0–46.0)
Hemoglobin: 7.8 g/dL — ABNORMAL LOW (ref 12.0–15.0)
MCH: 30.2 pg (ref 26.0–34.0)
MCHC: 31.7 g/dL (ref 30.0–36.0)
MCV: 95.3 fL (ref 80.0–100.0)
Platelets: 125 10*3/uL — ABNORMAL LOW (ref 150–400)
RBC: 2.58 MIL/uL — ABNORMAL LOW (ref 3.87–5.11)
RDW: 12.1 % (ref 11.5–15.5)
WBC: 7.5 10*3/uL (ref 4.0–10.5)
nRBC: 0 % (ref 0.0–0.2)

## 2020-03-07 LAB — PROTIME-INR
INR: 2.1 — ABNORMAL HIGH (ref 0.8–1.2)
Prothrombin Time: 23 seconds — ABNORMAL HIGH (ref 11.4–15.2)

## 2020-03-07 MED ORDER — LEVETIRACETAM 500 MG PO TABS
500.0000 mg | ORAL_TABLET | Freq: Two times a day (BID) | ORAL | Status: AC
  Administered 2020-03-07 – 2020-03-12 (×10): 500 mg via ORAL
  Filled 2020-03-07 (×10): qty 1

## 2020-03-07 NOTE — Progress Notes (Signed)
Orthopedic Tech Progress Note Patient Details:  Hannah Roy Nov 14, 1940 [id number] Nurse called requesting cervical soft collar. Patient currently wearing [place]. Left soft collar at bedside. Ortho Devices Type of Ortho Device: Soft collar   Post Interventions Instructions Provided: Adjustment of device, Care of device   Petra Kuba 03/07/2020, 8:30 PM

## 2020-03-07 NOTE — Progress Notes (Signed)
She looks good. Only complains of L rib pain, moves all ext, no drift, awake and alert and interactive.   Ok to move to floor F/E C-spine - if no instability would change to soft collar as the TP fxs of C6 and C7 should be stable.
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
## 2020-03-08 ENCOUNTER — Telehealth: Payer: Self-pay | Admitting: *Deleted

## 2020-03-08 ENCOUNTER — Inpatient Hospital Stay (HOSPITAL_COMMUNITY): Payer: No Typology Code available for payment source

## 2020-03-08 LAB — BASIC METABOLIC PANEL
Anion gap: 10 (ref 5–15)
BUN: 8 mg/dL (ref 8–23)
CO2: 23 mmol/L (ref 22–32)
Calcium: 8.2 mg/dL — ABNORMAL LOW (ref 8.9–10.3)
Chloride: 105 mmol/L (ref 98–111)
Creatinine, Ser: 0.75 mg/dL (ref 0.44–1.00)
GFR, Estimated: >60 mL/min (ref >60)
Glucose, Bld: 96 mg/dL (ref 70–99)
Potassium: 3.5 mmol/L (ref 3.5–5.1)
Sodium: 138 mmol/L (ref 135–145)

## 2020-03-08 LAB — CBC
HCT: 24.4 % — ABNORMAL LOW (ref 36.0–46.0)
Hemoglobin: 8 g/dL — ABNORMAL LOW (ref 12.0–15.0)
MCH: 30.8 pg (ref 26.0–34.0)
MCHC: 32.8 g/dL (ref 30.0–36.0)
MCV: 93.8 fL (ref 80.0–100.0)
Platelets: 144 10*3/uL — ABNORMAL LOW (ref 150–400)
RBC: 2.6 MIL/uL — ABNORMAL LOW (ref 3.87–5.11)
RDW: 11.9 % (ref 11.5–15.5)
WBC: 6.7 10*3/uL (ref 4.0–10.5)
nRBC: 0 % (ref 0.0–0.2)

## 2020-03-08 LAB — PROTIME-INR
INR: 1.4 — ABNORMAL HIGH (ref 0.8–1.2)
Prothrombin Time: 16.4 seconds — ABNORMAL HIGH (ref 11.4–15.2)

## 2020-03-08 MED ORDER — WARFARIN SODIUM 5 MG PO TABS
5.0000 mg | ORAL_TABLET | ORAL | Status: DC
  Administered 2020-03-08 – 2020-03-11 (×2): 5 mg via ORAL
  Filled 2020-03-08 (×4): qty 1

## 2020-03-08 MED ORDER — WARFARIN - PHYSICIAN DOSING INPATIENT: Freq: Every day | Status: DC

## 2020-03-08 MED ORDER — WARFARIN SODIUM 2.5 MG PO TABS
3.7500 mg | ORAL_TABLET | ORAL | Status: DC
  Administered 2020-03-09 – 2020-03-10 (×2): 3.75 mg via ORAL
  Filled 2020-03-08 (×3): qty 1
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
## 2020-03-08 NOTE — Telephone Encounter (Signed)
Aware and chart reviewed.
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
## 2020-03-08 NOTE — Progress Notes (Signed)
Subjective: Patient reports no issues.  No headache though she does have a headache at times that is relieved by pain medications.  Denies neck pain or neck tenderness.  Still has some left rib pain.  Objective: Vital signs in last 24 hours: Temp:  [98.9 F (37.2 C)-99.9 F (37.7 C)] 98.9 F (37.2 C) (11/29 0800) Pulse Rate:  [84-110] 110 (11/29 0800) Resp:  [14-23] 23 (11/29 0800) BP: (82-122)/(52-81) 98/65 (11/29 0800) SpO2:  [91 %-99 %] 95 % (11/29 0800)  Intake/Output from previous day: 11/28 0701 - 11/29 0700 In: 1255.9 [P.O.:120; I.V.:1035.9; IV Piggyback:100] Out: 1225 [Urine:1225] Intake/Output this shift: Total I/O In: 200 [I.V.:200] Out: -   Awake and alert, moves all extremities, pleasant and interactive, no aphasia, no tenderness in the cervical spine.  Lab Results: Lab Results  Component Value Date   WBC 6.7 03/08/2020   HGB 8.0 (L) 03/08/2020   HCT 24.4 (L) 03/08/2020   MCV 93.8 03/08/2020   PLT 144 (L) 03/08/2020   Lab Results  Component Value Date   INR 1.4 (H) 03/08/2020   BMET Lab Results  Component Value Date   NA 138 03/08/2020   K 3.5 03/08/2020   CL 105 03/08/2020   CO2 23 03/08/2020   GLUCOSE 96 03/08/2020   BUN 8 03/08/2020   CREATININE 0.75 03/08/2020   CALCIUM 8.2 (L) 03/08/2020    Studies/Results: No results found.  Assessment/Plan: She looks very good clinically.  She still gets some mild headache at times.  Is controlled with pain medication.  She complains of left rib pain.  She does not have any significant neck pain.  Soft collar that was brought for her is way too big for her.  We will order another collar.  I will check a flex ex today.  At this point, I think it is safe for her to resume her Coumadin for her mechanical valves.  There will obviously be risk but I think the risk of lack of anticoagulation is higher than the risk of anticoagulation at this point.  Estimated body mass index is 22.91 kg/m as calculated from  the following:   Height as of this encounter: 5\' 1"  (1.549 m).   Weight as of this encounter: 55 kg.    LOS: 4 days    Hannah Roy 03/08/2020, 9:10 AM
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
## 2020-03-08 NOTE — Progress Notes (Signed)
Orthopedic Tech Progress Note Patient Details:  Hannah Roy Jul 22, 1940 [id number] RN called requesting a SMALLER C COLLAR Ortho Devices Type of Ortho Device: Soft collar Ortho Device/Splint Location: NECK Ortho Device/Splint Interventions: Application   Post Interventions Patient Tolerated: Well Instructions Provided: Care of device   Janit Pagan 03/08/2020, 10:10 AM

## 2020-03-09 LAB — CBC
HCT: 21.6 % — ABNORMAL LOW (ref 36.0–46.0)
Hemoglobin: 7.3 g/dL — ABNORMAL LOW (ref 12.0–15.0)
MCH: 31.5 pg (ref 26.0–34.0)
MCHC: 33.8 g/dL (ref 30.0–36.0)
MCV: 93.1 fL (ref 80.0–100.0)
Platelets: 179 10*3/uL (ref 150–400)
RBC: 2.32 MIL/uL — ABNORMAL LOW (ref 3.87–5.11)
RDW: 12 % (ref 11.5–15.5)
WBC: 4.8 10*3/uL (ref 4.0–10.5)
nRBC: 0 % (ref 0.0–0.2)

## 2020-03-09 LAB — PROTIME-INR
INR: 1.3 — ABNORMAL HIGH (ref 0.8–1.2)
Prothrombin Time: 16 seconds — ABNORMAL HIGH (ref 11.4–15.2)

## 2020-03-09 LAB — BASIC METABOLIC PANEL WITH GFR
Anion gap: 10 (ref 5–15)
BUN: 7 mg/dL — ABNORMAL LOW (ref 8–23)
CO2: 27 mmol/L (ref 22–32)
Calcium: 8.4 mg/dL — ABNORMAL LOW (ref 8.9–10.3)
Chloride: 104 mmol/L (ref 98–111)
Creatinine, Ser: 0.77 mg/dL (ref 0.44–1.00)
GFR, Estimated: >60 mL/min
Glucose, Bld: 109 mg/dL — ABNORMAL HIGH (ref 70–99)
Potassium: 3.5 mmol/L (ref 3.5–5.1)
Sodium: 141 mmol/L (ref 135–145)

## 2020-03-09 LAB — RESP PANEL BY RT-PCR (FLU A&B, COVID) ARPGX2
Influenza A by PCR: NEGATIVE
Influenza B by PCR: NEGATIVE
SARS Coronavirus 2 by RT PCR: NEGATIVE

## 2020-03-09 MED ORDER — POLYETHYLENE GLYCOL 3350 17 G PO PACK
17.0000 g | PACK | Freq: Every day | ORAL | Status: DC
  Administered 2020-03-09 – 2020-03-13 (×4): 17 g via ORAL
  Filled 2020-03-09 (×4): qty 1

## 2020-03-09 MED ORDER — DOCUSATE SODIUM 100 MG PO CAPS
100.0000 mg | ORAL_CAPSULE | Freq: Two times a day (BID) | ORAL | Status: DC
  Administered 2020-03-09 – 2020-03-13 (×9): 100 mg via ORAL
  Filled 2020-03-09 (×9): qty 1

## 2020-03-09 MED ORDER — ASCORBIC ACID 500 MG PO TABS
500.0000 mg | ORAL_TABLET | Freq: Two times a day (BID) | ORAL | Status: DC
  Administered 2020-03-09 – 2020-03-13 (×8): 500 mg via ORAL
  Filled 2020-03-09 (×12): qty 1

## 2020-03-09 MED ORDER — METHOCARBAMOL 500 MG PO TABS
500.0000 mg | ORAL_TABLET | Freq: Three times a day (TID) | ORAL | Status: DC | PRN
  Administered 2020-03-12: 500 mg via ORAL
  Filled 2020-03-09: qty 1

## 2020-03-09 MED ORDER — ACETAMINOPHEN 500 MG PO TABS
1000.0000 mg | ORAL_TABLET | Freq: Three times a day (TID) | ORAL | Status: DC
  Administered 2020-03-09 – 2020-03-12 (×7): 1000 mg via ORAL
  Filled 2020-03-09 (×9): qty 2

## 2020-03-09 MED ORDER — MORPHINE SULFATE (PF) 2 MG/ML IV SOLN: 2.0000 mg | INTRAVENOUS | Status: DC | PRN

## 2020-03-09 MED ORDER — FERROUS SULFATE 325 (65 FE) MG PO TABS
325.0000 mg | ORAL_TABLET | Freq: Two times a day (BID) | ORAL | Status: DC
  Administered 2020-03-09 – 2020-03-13 (×9): 325 mg via ORAL
  Filled 2020-03-09 (×9): qty 1

## 2020-03-09 MED ORDER — OXYCODONE HCL 5 MG PO TABS: 5.0000 mg | ORAL_TABLET | Freq: Four times a day (QID) | ORAL | Status: DC | PRN

## 2020-03-09 NOTE — Care Management Important Message (Signed)
Important Message  Patient Details  Name: Hannah Roy MRN: [id number] Date of Birth: 19-Dec-1940   Medicare Important Message Given:  Yes     Orbie Pyo 03/09/2020, 2:54 PM

## 2020-03-09 NOTE — Progress Notes (Signed)
Looks good today, no neck pain or H/A. No drift, awake alert, in soft collar. No issues over night. Continue to mobilize.
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
## 2020-03-09 NOTE — Discharge Instructions (Signed)
Wear soft neck collar for C6-7 transverse process fractures until cleared by neurosurgeon   Subdural Hematoma  A subdural hematoma is a collection of blood between the brain and its outer covering (dura). As the amount of blood increases, pressure builds on the brain. There are two types of subdural hematomas:  Acute. This type develops shortly after a hard, direct hit to the head and causes blood to collect very quickly. This is a medical emergency. If it is not diagnosed and treated quickly, it can lead to severe brain injury or death.  Chronic. This is when bleeding develops more slowly, over weeks or months. In some cases, this type does not cause symptoms. What are the causes? This condition is caused by bleeding (hemorrhage) from a broken (ruptured) blood vessel. In most cases, a blood vessel ruptures and bleeds because of a head injury, such as from a hard, direct hit. Head injuries can happen in car accidents, falls, assaults, or while playing sports. In rare cases, a hemorrhage can happen without a known cause (spontaneously), especially if you take blood thinners (anticoagulants). What increases the risk? This condition is more likely to develop in:  Older people.  Infants.  People who take blood thinners.  People who have head injuries.  People who abuse alcohol. What are the signs or symptoms? Symptoms of this condition can vary depending on the size of the hematoma. Symptoms can be mild, severe, or life-threatening. They include:  Headaches.  Nausea or vomiting.  Changes in vision, such as double vision or loss of vision.  Changes in speech or trouble understanding what people say.  Loss of balance or trouble walking.  Weakness, numbness, or tingling in the arms or legs, especially on one side of the body.  Seizures.  Change in personality.  Increased sleepiness.  Memory loss.  Loss of consciousness.  Coma. Symptoms of acute subdural hematoma can  develop over minutes or hours. Symptoms of chronic subdural hematoma may develop over weeks or months. How is this diagnosed? This condition is diagnosed based on the results of:  A physical exam.  Tests of strength, reflexes, coordination, senses, manner of walking (gait), and facial and eye movements (neurological exam).  Imaging tests, such as an MRI or a CT scan. How is this treated? Treatment for this condition depends on the type of hematoma and how severe it is. Treatment for acute hematoma may include:  Emergency surgery to drain blood or remove a blood clot.  Medicines that help the body get rid of excess fluids (diuretics). These may help to reduce pressure in the brain.  Assisted breathing (ventilation). Treatment for chronic hematoma may include:  Observation and bed rest at the hospital.  Surgery. If you take blood thinners, you may need to stop taking them for a short time. You may also be given anti-seizure (anticonvulsant) medicine. Sometimes, no treatment is needed for chronic subdural hematoma. Follow these instructions at home: Activity  Avoid situations where you could injure your head again, such as in competitive sports, downhill snow sports, and horseback riding. Do not do these activities until your health care provider approves. ? Wear protective gear, such as a helmet, when participating in activities such as biking or contact sports.  Avoid too much visual stimulation while recovering. This means limiting how much you read and limiting your screen time on a smart phone, tablet, computer, or TV.  Rest as told by your health care provider. Rest helps the brain heal.  Try to avoid  activities that cause physical or mental stress. Return to work or school as told by your health care provider.  Do not lift anything that is heavier than 5 lb (2.3 kg), or the limit you are told, until your health care provider says that it is safe.  Do not drive, ride a bike,  or use heavy machinery until your health care provider approves.  Always wear your seat belt when you are in a motor vehicle. Alcohol use  Do not drink alcohol if your health care provider tells you not to drink.  If you drink alcohol, limit how much you use to: ? 0-1 drink a day for women. ? 0-2 drinks a day for men. General instructions  Monitor your symptoms, and ask people around you to do the same. Recovery from brain injuries varies. Talk with your health care provider about what to expect.  Take over-the-counter and prescription medicines only as told by your health care provider. Do not take blood thinners or NSAIDs unless your health care provider approves. These include aspirin, ibuprofen, naproxen, and warfarin.  Keep your home environment safe to reduce the risk of falling.  Keep all follow-up visits as told by your health care provider. This is important. Where to find more information  [employer] of Neurological Disorders and Stroke: MasterBoxes.it  American Academy of Neurology (AAN): http://keith.biz/  Brain Injury Association of [place]: www.biausa.org Get help right away if you:  Are taking blood thinners and you fall or you experience minor trauma to the head. If you take any blood thinners, even a very small injury can cause a subdural hematoma.  Have a bleeding disorder and you fall or you experience minor trauma to the head.  Develop any of the following symptoms after a head injury: ? Clear fluid draining from your nose or ears. ? Nausea or vomiting. ? Changes in speech or trouble understanding what people say. ? Seizures. ? Drowsiness or a decrease in alertness. ? Double vision. ? Numbness or inability to move (paralysis) in any part of your body. ? Difficulty walking or poor coordination. ? Difficulty thinking. ? Confusion or forgetfulness. ? Personality changes. ? Irrational or aggressive behavior. These symptoms may represent a serious  problem that is an emergency. Do not wait to see if the symptoms will go away. Get medical help right away. Call your local emergency services (911 in the [place] Do not drive yourself to the hospital. Summary  A subdural hematoma is a collection of blood between the brain and its outer covering (dura).  Treatment for this condition depends on what type of subdural hematoma you have and how severe it is.  Symptoms can vary from mild to severe to life-threatening.  Monitor your symptoms, and ask others around you to do the same. This information is not intended to replace advice given to you by your health care provider. Make sure you discuss any questions you have with your health care provider. Document Revised: 02/25/2018 Document Reviewed: 02/25/2018 Elsevier Patient Education  [employer]   1. PAIN CONTROL:  1. Pain is best controlled by a usual combination of three different methods TOGETHER:  i. Ice/Heat ii. Over the counter pain medication iii. Prescription pain medication 2. You may experience some swelling and bruising in area of broken ribs. Ice packs or heating pads (30-60 minutes up to 6 times a day) will help. Use ice for the first few days to help decrease swelling and  bruising, then switch to heat to help relax tight/sore spots and speed recovery. Some people prefer to use ice alone, heat alone, alternating between ice & heat. Experiment to what works for you. Swelling and bruising can take several weeks to resolve.  3. It is helpful to take an over-the-counter pain medication regularly for the first few weeks. Choose one of the following that works best for you:  i. Naproxen (Aleve, etc) Two 220mg  tabs twice a day ii. Ibuprofen (Advil, etc) Three 200mg  tabs four times a day (every meal & bedtime) iii. Acetaminophen (Tylenol, etc) 500-650mg  four times a day (every meal & bedtime) 4. A prescription for pain medication (such as  oxycodone, hydrocodone, etc) may be given to you upon discharge. Take your pain medication as prescribed.  i. If you are having problems/concerns with the prescription medicine (does not control pain, nausea, vomiting, rash, itching, etc), please call us [phone] to see if we need to switch you to a different pain medicine that will work better for you and/or control your side effect better. ii. If you need a refill on your pain medication, please contact your pharmacy. They will contact our office to request authorization. Prescriptions will not be filled after 5 pm or on week-ends. 1. Avoid getting constipated. When taking pain medications, it is common to experience some constipation. Increasing fluid intake and taking a fiber supplement (such as Metamucil, Citrucel, FiberCon, MiraLax, etc) 1-2 times a day regularly will usually help prevent this problem from occurring. A mild laxative (prune juice, Milk of Magnesia, MiraLax, etc) should be taken according to package directions if there are no bowel movements after 48 hours.  2. Watch out for diarrhea. If you have many loose bowel movements, simplify your diet to bland foods & liquids for a few days. Stop any stool softeners and decrease your fiber supplement. Switching to mild anti-diarrheal medications (Kayopectate, Pepto Bismol) can help. If this worsens or does not improve, please call us. 3. FOLLOW UP  a. If a follow up appointment is needed one will be scheduled for you. If none is needed with our trauma team, please follow up with your primary care provider within 2-3 weeks from discharge. Please call [facility] at [phone] if you have any questions about follow up.  b. If you have any orthopedic or other injuries you will need to follow up as outlined in your follow up instructions.   WHEN TO CALL us [phone]:  1. Poor pain control 2. Reactions / problems with new medications (rash/itching, nausea, etc)  3. Fever over 101.5 F (38.5  C) 4. Worsening swelling or bruising 5. Worsening pain, productive cough, difficulty breathing or any other concerning symptoms  The clinic staff is available to answer your questions during regular business hours (8:30am-5pm). Please don't hesitate to call and ask to speak to one of our nurses for clinical concerns.  If you have a medical emergency, go to the nearest emergency room or call 911.  A surgeon from [facility] is always on call at the [facility], [address], [place], [place], [place] [address] ?  MAIN: [phone] ? TOLL FREE: [phone] ?  FAX [phone]  [email or web address]      Information on Rib Fractures  A rib fracture is a break or crack in one of the bones of the ribs. The ribs are long, curved bones that wrap around your chest and attach to your spine  and your breastbone. The ribs protect your heart, lungs, and other organs in the chest. A broken or cracked rib is often painful but is not usually serious. Most rib fractures heal on their own over time. However, rib fractures can be more serious if multiple ribs are broken or if broken ribs move out of place and push against other structures or organs. What are the causes? This condition is caused by:  Repetitive movements with high force, such as pitching a baseball or having severe coughing spells.  A direct blow to the chest, such as a sports injury, a car accident, or a fall.  Cancer that has spread to the bones, which can weaken bones and cause them to break. What are the signs or symptoms? Symptoms of this condition include:  Pain when you breathe in or cough.  Pain when someone presses on the injured area.  Feeling short of breath. How is this diagnosed? This condition is diagnosed with a physical exam and medical history. Imaging tests may also be done, such as:  Chest X-ray.  CT scan.  MRI.  Bone scan.  Chest  ultrasound. How is this treated? Treatment for this condition depends on the severity of the fracture. Most rib fractures usually heal on their own in 1-3 months. Sometimes healing takes longer if there is a cough that does not stop or if there are other activities that make the injury worse (aggravating factors). While you heal, you will be given medicines to control the pain. You will also be taught deep breathing exercises. Severe injuries may require hospitalization or surgery. Follow these instructions at home: Managing pain, stiffness, and swelling  If directed, apply ice to the injured area. ? Put ice in a plastic bag. ? Place a towel between your skin and the bag. ? Leave the ice on for 20 minutes, 2-3 times a day.  Take over-the-counter and prescription medicines only as told by your health care provider. Activity  Avoid a lot of activity and any activities or movements that cause pain. Be careful during activities and avoid bumping the injured rib.  Slowly increase your activity as told by your health care provider. General instructions  Do deep breathing exercises as told by your health care provider. This helps prevent pneumonia, which is a common complication of a broken rib. Your health care provider may instruct you to: ? Take deep breaths several times a day. ? Try to cough several times a day, holding a pillow against the injured area. ? Use a device called incentive spirometer to practice deep breathing several times a day.  Drink enough fluid to keep your urine pale yellow.  Do not wear a rib belt or binder. These restrict breathing, which can lead to pneumonia.  Keep all follow-up visits as told by your health care provider. This is important. Contact a health care provider if:  You have a fever. Get help right away if:  You have difficulty breathing or you are short of breath.  You develop a cough that does not stop, or you cough up thick or bloody  sputum.  You have nausea, vomiting, or pain in your abdomen.  Your pain gets worse and medicine does not help. Summary  A rib fracture is a break or crack in one of the bones of the ribs.  A broken or cracked rib is often painful but is not usually serious.  Most rib fractures heal on their own over time.  Treatment for this condition  depends on the severity of the fracture.  Avoid a lot of activity and any activities or movements that cause pain. This information is not intended to replace advice given to you by your health care provider. Make sure you discuss any questions you have with your health care provider. Document Released: 03/27/2005 Document Revised: 06/26/2016 Document Reviewed: 06/26/2016 Elsevier Interactive Patient Education  2019 [employer].

## 2020-03-09 NOTE — Progress Notes (Addendum)
Orthopedic Tech Progress Note Patient Details:  Hannah Roy 1940-08-30 [id number] MD walked in this morning and saw patient with the smaller collar on and was happy, since I had a different soft collar with me he decide why not take a stab and see how this one would fit and decides to change them out. Applied with MD a 3in SOFT COLLAR. left other one in patients closet with other belongings also told RN of changes   Patient ID: [name], female   DOB: 12/16/1940, 79 y.o.   MRN: [id number]   Janit Pagan 03/09/2020, 7:56 AM
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
## 2020-03-10 DIAGNOSIS — S8000XA Contusion of unspecified knee, initial encounter: Secondary | ICD-10-CM | POA: Diagnosis not present

## 2020-03-10 DIAGNOSIS — S12601A Unspecified nondisplaced fracture of seventh cervical vertebra, initial encounter for closed fracture: Secondary | ICD-10-CM | POA: Diagnosis not present

## 2020-03-10 DIAGNOSIS — T07XXXA Unspecified multiple injuries, initial encounter: Secondary | ICD-10-CM | POA: Diagnosis not present

## 2020-03-10 DIAGNOSIS — I4891 Unspecified atrial fibrillation: Secondary | ICD-10-CM

## 2020-03-10 DIAGNOSIS — S066X9A Traumatic subarachnoid hemorrhage with loss of consciousness of unspecified duration, initial encounter: Secondary | ICD-10-CM

## 2020-03-10 DIAGNOSIS — I10 Essential (primary) hypertension: Secondary | ICD-10-CM

## 2020-03-10 DIAGNOSIS — R000 Tachycardia, unspecified: Secondary | ICD-10-CM

## 2020-03-10 DIAGNOSIS — S12600A Unspecified displaced fracture of seventh cervical vertebra, initial encounter for closed fracture: Secondary | ICD-10-CM

## 2020-03-10 LAB — CBC
HCT: 23 % — ABNORMAL LOW (ref 36.0–46.0)
Hemoglobin: 7.4 g/dL — ABNORMAL LOW (ref 12.0–15.0)
MCH: 30.2 pg (ref 26.0–34.0)
MCHC: 32.2 g/dL (ref 30.0–36.0)
MCV: 93.9 fL (ref 80.0–100.0)
Platelets: 216 10*3/uL (ref 150–400)
RBC: 2.45 MIL/uL — ABNORMAL LOW (ref 3.87–5.11)
RDW: 12.2 % (ref 11.5–15.5)
WBC: 4.6 10*3/uL (ref 4.0–10.5)
nRBC: 0 % (ref 0.0–0.2)

## 2020-03-10 LAB — BASIC METABOLIC PANEL
Anion gap: 11 (ref 5–15)
BUN: 6 mg/dL — ABNORMAL LOW (ref 8–23)
CO2: 25 mmol/L (ref 22–32)
Calcium: 8.6 mg/dL — ABNORMAL LOW (ref 8.9–10.3)
Chloride: 105 mmol/L (ref 98–111)
Creatinine, Ser: 0.75 mg/dL (ref 0.44–1.00)
GFR, Estimated: >60 mL/min (ref >60)
Glucose, Bld: 105 mg/dL — ABNORMAL HIGH (ref 70–99)
Potassium: 3.6 mmol/L (ref 3.5–5.1)
Sodium: 141 mmol/L (ref 135–145)

## 2020-03-10 LAB — PROTIME-INR
INR: 1.4 — ABNORMAL HIGH (ref 0.8–1.2)
Prothrombin Time: 16.8 seconds — ABNORMAL HIGH (ref 11.4–15.2)

## 2020-03-10 MED ORDER — MAGNESIUM HYDROXIDE 400 MG/5ML PO SUSP
30.0000 mL | Freq: Once | ORAL | Status: AC
  Administered 2020-03-10: 30 mL via ORAL
  Filled 2020-03-10: qty 30
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
## 2020-03-10 NOTE — Progress Notes (Signed)
Admissions Coordinator Note:  Per CM request, this patient was screened by Raechel Ache for appropriateness for an Inpatient Acute Rehab Consult.  At this time, we are recommending an Inpatient Rehab consult. AC will place consult order in the chart per protocol.   Raechel Ache 03/10/2020, 2:58 PM  I can be reached at [phone].
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
## 2020-03-11 ENCOUNTER — Ambulatory Visit (HOSPITAL_COMMUNITY): Payer: Medicare Other | Admitting: Nurse Practitioner

## 2020-03-11 LAB — CBC
HCT: 23.2 % — ABNORMAL LOW (ref 36.0–46.0)
Hemoglobin: 8.1 g/dL — ABNORMAL LOW (ref 12.0–15.0)
MCH: 31.9 pg (ref 26.0–34.0)
MCHC: 34.9 g/dL (ref 30.0–36.0)
MCV: 91.3 fL (ref 80.0–100.0)
Platelets: 262 10*3/uL (ref 150–400)
RBC: 2.54 MIL/uL — ABNORMAL LOW (ref 3.87–5.11)
RDW: 12.5 % (ref 11.5–15.5)
WBC: 3.5 10*3/uL — ABNORMAL LOW (ref 4.0–10.5)
nRBC: 0 % (ref 0.0–0.2)

## 2020-03-11 LAB — PROTIME-INR
INR: 1.6 — ABNORMAL HIGH (ref 0.8–1.2)
Prothrombin Time: 18.6 seconds — ABNORMAL HIGH (ref 11.4–15.2)

## 2020-03-11 MED ORDER — LORAZEPAM 0.5 MG PO TABS
0.5000 mg | ORAL_TABLET | Freq: Four times a day (QID) | ORAL | Status: DC | PRN
  Administered 2020-03-11: 0.5 mg via ORAL
  Filled 2020-03-11: qty 1

## 2020-03-11 MED ORDER — WARFARIN - PHARMACIST DOSING INPATIENT: Freq: Every day | Status: DC

## 2020-03-11 NOTE — TOC Progression Note (Signed)
Transition of Care [facility]) - Progression Note    Patient Details  Name: Hannah Roy MRN: [id number] Date of Birth: 20-Mar-1941  Transition of Care [facility]) CM/SW Contact  Oren Section Cleta Alberts, RN Phone Number: 03/11/2020, 3:37 PM  Clinical Narrative: CIR consult in progress; appears patient is a good candidate for inpatient rehab.  Will follow for CIR bed availability.    Expected Discharge Plan: IP Rehab Facility Barriers to Discharge: Continued Medical Work up  Expected Discharge Plan and Services Expected Discharge Plan: [place]   Discharge Planning Services: CM Consult   Living arrangements for the past 2 months: Single Family Home                                       Social Determinants of Health (SDOH) Interventions    Readmission Risk Interventions Readmission Risk Prevention Plan 09/13/2018  Transportation Screening Complete  PCP or Specialist Appt within 5-7 Days Complete  Home Care Screening Complete  Medication Review (RN CM) Complete  Some recent data might be hidden   Reinaldo Raddle, RN, BSN  Trauma/Neuro ICU Case Manager [phone]
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
## 2020-03-11 NOTE — Progress Notes (Signed)
Called to room.  Pt anxious stating she "cannot breathe" and that she is"paralyzed on her right arm"  Pt with moderate, unchanged grip and able to hold arm up for 10 seconds, follows all commands.  Stated she was having an "anxiety attack."  No complaints of pain. VSS BP 113/81; pulse 110; resp 18; 100% RA. Dr. Kieth Brightly notified and received order for lorazepam.
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
## 2020-03-11 NOTE — Progress Notes (Signed)
Inpatient Rehabilitation Admissions Coordinator  I met with patient at bedside and then spoke with her daughter, Lattie Haw, by phone., We discussed goals and expectations a possible Cir admit. Family can provide 24/7 supervision after Cir admit and Lattie Haw states she will not approve SNF placement. She will bring patient home before she will agree to SNF placement. I await bed availability for Cir admit.  Danne Baxter, RN, MSN Rehab Admissions Coordinator [phone] 03/11/2020 1:26 PM

## 2020-03-12 ENCOUNTER — Inpatient Hospital Stay (HOSPITAL_COMMUNITY): Payer: No Typology Code available for payment source

## 2020-03-12 ENCOUNTER — Other Ambulatory Visit: Payer: Self-pay

## 2020-03-12 DIAGNOSIS — I480 Paroxysmal atrial fibrillation: Secondary | ICD-10-CM

## 2020-03-12 LAB — PROTIME-INR
INR: 1.7 — ABNORMAL HIGH (ref 0.8–1.2)
Prothrombin Time: 19.3 seconds — ABNORMAL HIGH (ref 11.4–15.2)

## 2020-03-12 MED ORDER — WARFARIN SODIUM 5 MG PO TABS: 5.0000 mg | ORAL_TABLET | Freq: Once | ORAL | Status: DC

## 2020-03-12 NOTE — Progress Notes (Signed)
Pt has been alert and oriented, with slight moments of confusion (mostly when anxious if the right word didn't come to her). She did great with eating, drinking, swallowing pills. PT workup was successful. Pt's HR and BP tend to spike a little more (explaining the random yellow MEWS) when she feels like she can't do a certain task. Emotional support offered and RN suggested methods of slowing down and taking the time to process things. Pt was assured that she was making progress and that it would take time. Pt felt more confident throughout the day, was up in the chair for most of the afternoon, daughter came to visit and is aware of care plan. Pt to go to CIR tomorrow.
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
## 2020-03-12 NOTE — Progress Notes (Signed)
Inpatient Rehabilitation Admissions Coordinator  CIR bed is available to admit patient to tomorrow. I met with patient at bedside, contacted Trauma PA, Kelly and will call daughter, Anita. Patient is in agreement. Saturday, Dr Swartz will give final approval in the morning. [facility] RN can call Rehab Charge RN at 12 noon to give report at [phone]. I will make the arrangements to admit. Acute team and TOC made aware of plans to admit Saturday.  Barbara Boyette, RN, MSN Rehab Admissions Coordinator [phone] 03/12/2020 12:42 PM  
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
## 2020-03-13 ENCOUNTER — Encounter (HOSPITAL_COMMUNITY): Payer: Self-pay | Admitting: Physical Medicine & Rehabilitation

## 2020-03-13 ENCOUNTER — Inpatient Hospital Stay (HOSPITAL_COMMUNITY)
Admission: RE | Admit: 2020-03-13 | Discharge: 2020-03-23 | DRG: 945 | Disposition: A | Discharge status: Home / Self Care | Payer: Medicare Other | Source: Intra-hospital | Attending: Physical Medicine & Rehabilitation | Admitting: Physical Medicine & Rehabilitation

## 2020-03-13 DIAGNOSIS — I483 Typical atrial flutter: Secondary | ICD-10-CM | POA: Diagnosis not present

## 2020-03-13 DIAGNOSIS — D62 Acute posthemorrhagic anemia: Secondary | ICD-10-CM

## 2020-03-13 DIAGNOSIS — S2242XD Multiple fractures of ribs, left side, subsequent encounter for fracture with routine healing: Secondary | ICD-10-CM

## 2020-03-13 DIAGNOSIS — S8001XD Contusion of right knee, subsequent encounter: Secondary | ICD-10-CM | POA: Diagnosis not present

## 2020-03-13 DIAGNOSIS — S065X9D Traumatic subdural hemorrhage with loss of consciousness of unspecified duration, subsequent encounter: Secondary | ICD-10-CM | POA: Diagnosis not present

## 2020-03-13 DIAGNOSIS — S069X1S Unspecified intracranial injury with loss of consciousness of 30 minutes or less, sequela: Secondary | ICD-10-CM

## 2020-03-13 DIAGNOSIS — Z953 Presence of xenogenic heart valve: Secondary | ICD-10-CM | POA: Diagnosis not present

## 2020-03-13 DIAGNOSIS — K5900 Constipation, unspecified: Secondary | ICD-10-CM | POA: Diagnosis present

## 2020-03-13 DIAGNOSIS — Z87891 Personal history of nicotine dependence: Secondary | ICD-10-CM | POA: Diagnosis not present

## 2020-03-13 DIAGNOSIS — I4819 Other persistent atrial fibrillation: Secondary | ICD-10-CM | POA: Diagnosis not present

## 2020-03-13 DIAGNOSIS — I480 Paroxysmal atrial fibrillation: Secondary | ICD-10-CM | POA: Diagnosis not present

## 2020-03-13 DIAGNOSIS — Z7901 Long term (current) use of anticoagulants: Secondary | ICD-10-CM | POA: Diagnosis not present

## 2020-03-13 DIAGNOSIS — I4892 Unspecified atrial flutter: Secondary | ICD-10-CM | POA: Diagnosis not present

## 2020-03-13 DIAGNOSIS — E039 Hypothyroidism, unspecified: Secondary | ICD-10-CM | POA: Diagnosis present

## 2020-03-13 DIAGNOSIS — S129XXS Fracture of neck, unspecified, sequela: Secondary | ICD-10-CM

## 2020-03-13 DIAGNOSIS — S2249XS Multiple fractures of ribs, unspecified side, sequela: Secondary | ICD-10-CM

## 2020-03-13 DIAGNOSIS — G4701 Insomnia due to medical condition: Secondary | ICD-10-CM

## 2020-03-13 DIAGNOSIS — S22051D Stable burst fracture of T5-T6 vertebra, subsequent encounter for fracture with routine healing: Secondary | ICD-10-CM

## 2020-03-13 DIAGNOSIS — S12600A Unspecified displaced fracture of seventh cervical vertebra, initial encounter for closed fracture: Secondary | ICD-10-CM | POA: Diagnosis present

## 2020-03-13 DIAGNOSIS — I10 Essential (primary) hypertension: Secondary | ICD-10-CM | POA: Diagnosis not present

## 2020-03-13 DIAGNOSIS — S301XXD Contusion of abdominal wall, subsequent encounter: Secondary | ICD-10-CM

## 2020-03-13 DIAGNOSIS — S8001XS Contusion of right knee, sequela: Secondary | ICD-10-CM | POA: Diagnosis not present

## 2020-03-13 DIAGNOSIS — K5901 Slow transit constipation: Secondary | ICD-10-CM

## 2020-03-13 DIAGNOSIS — S066X9D Traumatic subarachnoid hemorrhage with loss of consciousness of unspecified duration, subsequent encounter: Secondary | ICD-10-CM

## 2020-03-13 DIAGNOSIS — S066X9A Traumatic subarachnoid hemorrhage with loss of consciousness of unspecified duration, initial encounter: Secondary | ICD-10-CM | POA: Diagnosis present

## 2020-03-13 DIAGNOSIS — S069X9A Unspecified intracranial injury with loss of consciousness of unspecified duration, initial encounter: Principal | ICD-10-CM | POA: Diagnosis present

## 2020-03-13 DIAGNOSIS — S22061D Stable burst fracture of T7-T8 vertebra, subsequent encounter for fracture with routine healing: Secondary | ICD-10-CM | POA: Diagnosis not present

## 2020-03-13 DIAGNOSIS — S20212D Contusion of left front wall of thorax, subsequent encounter: Secondary | ICD-10-CM | POA: Diagnosis not present

## 2020-03-13 DIAGNOSIS — S069X0A Unspecified intracranial injury without loss of consciousness, initial encounter: Secondary | ICD-10-CM

## 2020-03-13 DIAGNOSIS — Z79899 Other long term (current) drug therapy: Secondary | ICD-10-CM

## 2020-03-13 DIAGNOSIS — I4891 Unspecified atrial fibrillation: Secondary | ICD-10-CM | POA: Diagnosis present

## 2020-03-13 DIAGNOSIS — S069XAA Unspecified intracranial injury with loss of consciousness status unknown, initial encounter: Secondary | ICD-10-CM | POA: Diagnosis present

## 2020-03-13 DIAGNOSIS — S069X0S Unspecified intracranial injury without loss of consciousness, sequela: Secondary | ICD-10-CM

## 2020-03-13 DIAGNOSIS — I959 Hypotension, unspecified: Secondary | ICD-10-CM

## 2020-03-13 LAB — PROTIME-INR
INR: 1.9 — ABNORMAL HIGH (ref 0.8–1.2)
Prothrombin Time: 21 seconds — ABNORMAL HIGH (ref 11.4–15.2)

## 2020-03-13 MED ORDER — DOCUSATE SODIUM 100 MG PO CAPS
100.0000 mg | ORAL_CAPSULE | Freq: Two times a day (BID) | ORAL | Status: DC
  Administered 2020-03-13 – 2020-03-16 (×6): 100 mg via ORAL
  Filled 2020-03-13 (×6): qty 1

## 2020-03-13 MED ORDER — GUAIFENESIN-DM 100-10 MG/5ML PO SYRP: 5.0000 mL | ORAL_SOLUTION | Freq: Four times a day (QID) | ORAL | Status: DC | PRN

## 2020-03-13 MED ORDER — AMLODIPINE BESYLATE 2.5 MG PO TABS
2.5000 mg | ORAL_TABLET | Freq: Every evening | ORAL | Status: DC
  Administered 2020-03-13 – 2020-03-15 (×3): 2.5 mg via ORAL
  Filled 2020-03-13 (×3): qty 1

## 2020-03-13 MED ORDER — PROCHLORPERAZINE EDISYLATE 10 MG/2ML IJ SOLN: 5.0000 mg | Freq: Four times a day (QID) | INTRAMUSCULAR | Status: DC | PRN

## 2020-03-13 MED ORDER — FERROUS SULFATE 325 (65 FE) MG PO TABS
325.0000 mg | ORAL_TABLET | Freq: Two times a day (BID) | ORAL | Status: DC
  Administered 2020-03-13 – 2020-03-23 (×20): 325 mg via ORAL
  Filled 2020-03-13 (×20): qty 1

## 2020-03-13 MED ORDER — PROCHLORPERAZINE MALEATE 5 MG PO TABS: 5.0000 mg | ORAL_TABLET | Freq: Four times a day (QID) | ORAL | Status: DC | PRN

## 2020-03-13 MED ORDER — PROCHLORPERAZINE 25 MG RE SUPP: 12.5000 mg | Freq: Four times a day (QID) | RECTAL | Status: DC | PRN

## 2020-03-13 MED ORDER — METOPROLOL TARTRATE 25 MG PO TABS
37.5000 mg | ORAL_TABLET | Freq: Two times a day (BID) | ORAL | Status: DC
  Administered 2020-03-13 – 2020-03-15 (×5): 37.5 mg via ORAL
  Filled 2020-03-13 (×6): qty 1

## 2020-03-13 MED ORDER — POLYETHYLENE GLYCOL 3350 17 G PO PACK: 17.0000 g | PACK | Freq: Every day | ORAL | Status: DC | PRN

## 2020-03-13 MED ORDER — LORAZEPAM 0.5 MG PO TABS: 0.5000 mg | ORAL_TABLET | Freq: Four times a day (QID) | ORAL | Status: DC | PRN

## 2020-03-13 MED ORDER — ALUM & MAG HYDROXIDE-SIMETH 200-200-20 MG/5ML PO SUSP: 30.0000 mL | ORAL | Status: DC | PRN

## 2020-03-13 MED ORDER — METHOCARBAMOL 500 MG PO TABS: 500.0000 mg | ORAL_TABLET | Freq: Three times a day (TID) | ORAL | Status: DC | PRN

## 2020-03-13 MED ORDER — ACETAMINOPHEN 325 MG PO TABS: 325.0000 mg | ORAL_TABLET | ORAL | Status: DC | PRN

## 2020-03-13 MED ORDER — FLEET ENEMA 7-19 GM/118ML RE ENEM: 1.0000 | ENEMA | Freq: Once | RECTAL | Status: DC | PRN

## 2020-03-13 MED ORDER — PANTOPRAZOLE SODIUM 40 MG PO TBEC
40.0000 mg | DELAYED_RELEASE_TABLET | Freq: Every day | ORAL | Status: DC
  Administered 2020-03-14 – 2020-03-23 (×10): 40 mg via ORAL
  Filled 2020-03-13 (×10): qty 1

## 2020-03-13 MED ORDER — POLYETHYLENE GLYCOL 3350 17 G PO PACK
17.0000 g | PACK | Freq: Every day | ORAL | Status: DC
  Administered 2020-03-14 – 2020-03-18 (×5): 17 g via ORAL
  Filled 2020-03-13 (×5): qty 1

## 2020-03-13 MED ORDER — OXYCODONE HCL 5 MG PO TABS: 5.0000 mg | ORAL_TABLET | Freq: Four times a day (QID) | ORAL | Status: DC | PRN

## 2020-03-13 MED ORDER — ACETAMINOPHEN 325 MG PO TABS
650.0000 mg | ORAL_TABLET | Freq: Three times a day (TID) | ORAL | Status: DC
  Administered 2020-03-13 – 2020-03-23 (×27): 650 mg via ORAL
  Filled 2020-03-13 (×30): qty 2

## 2020-03-13 MED ORDER — TRAZODONE HCL 50 MG PO TABS
25.0000 mg | ORAL_TABLET | Freq: Every evening | ORAL | Status: DC | PRN
  Administered 2020-03-21: 21:00:00 25 mg via ORAL
  Administered 2020-03-22: 22:00:00 50 mg via ORAL
  Filled 2020-03-13 (×2): qty 1

## 2020-03-13 MED ORDER — ASCORBIC ACID 500 MG PO TABS
500.0000 mg | ORAL_TABLET | Freq: Two times a day (BID) | ORAL | Status: DC
  Administered 2020-03-13 – 2020-03-23 (×20): 500 mg via ORAL
  Filled 2020-03-13 (×20): qty 1

## 2020-03-13 MED ORDER — DIPHENHYDRAMINE HCL 12.5 MG/5ML PO ELIX: 12.5000 mg | ORAL_SOLUTION | Freq: Four times a day (QID) | ORAL | Status: DC | PRN

## 2020-03-13 MED ORDER — BISACODYL 10 MG RE SUPP: 10.0000 mg | Freq: Every day | RECTAL | Status: DC | PRN

## 2020-03-13 NOTE — Progress Notes (Signed)
ANTICOAGULATION CONSULT NOTE - Initial Consult  Pharmacy Consult for warfarin Indication: Bioprosthetic AVR and afib  Allergies  Allergen Reactions  . Bee Venom Swelling and Other (See Comments)    Welts, also  . Other Other (See Comments)    ANY PAIN MEDS MUST, MUST BE PRECEDED BY AN ANTIEMETIC!!!!   . Jaclyn Prime [Ibandronic Acid] Nausea And Vomiting and Other (See Comments)    Upsets the stomach and flu-like symptoms   . Lisinopril Cough  . Latex Rash    Patient Measurements:     Vital Signs: Temp: 97.7 F (36.5 C) (12/04 1330) Temp Source: Oral (12/04 1330) BP: 127/90 (12/04 1330) Pulse Rate: 109 (12/04 1330)  Labs: Recent Labs    03/11/20 0822 03/12/20 0155 03/13/20 0208  HGB 8.1*  --   --   HCT 23.2*  --   --   PLT 262  --   --   LABPROT 18.6* 19.3* 21.0*  INR 1.6* 1.7* 1.9*    Estimated Creatinine Clearance: 44.4 mL/min (by C-G formula based on SCr of 0.75 mg/dL).   Medical History: Past Medical History:  Diagnosis Date  . Aortic atherosclerosis ([place])   . 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 Atypical atrial flutter ([place])   . Colon polyps   . Essential hypertension   . History of seasonal allergies   . Hypertension   . Hypothyroidism   . Osteopenia 2006  . Persistent atrial fibrillation ([place])   . Rheumatic valvular disease   . 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   21 mm [facility] Ease stented bovine pericardial tissue valve  . S/P ascending aortic aneurysm repair 09/05/2018   28 mm Hemashield platinum supracoronary straight graft  . S/P Maze operation for atrial fibrillation 09/05/2018   Complete bilateral atrial lesion set using bipolar radiofrequency and cryothermy ablation with clipping of LA appendage  . 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   29 mm [facility] Mitral stented bovine pericardial tissue valve  . 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   28 mm Edwards mc3 ring annuloplasty     Assessment: Pharmacy consulted for warfarin on transfer to CIR, however plan per trauma/cardiology and CT surgery is to hold anticoagulation and f/u with cardiology once discharged for long-term [facility] plan - see discharge summary and cardiology/trauma notes from 12/3  Goal of Therapy:  INR 2-3 Monitor platelets by anticoagulation protocol: Yes   Plan:  Hold warfarin Contact rehab attending in AM to confirm plan as above  Bertis Ruddy, PharmD Clinical Pharmacist Please check AMION for all [place] numbers 03/13/2020 2:38 PM
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
## 2020-03-13 NOTE — Progress Notes (Signed)
   Providing Compassionate, Quality Care - Together  NEUROSURGERY PROGRESS NOTE   S: No issues overnight.  Still some slight confusion  O: EXAM:  BP 122/84 (BP Location: Left Arm)   Pulse (!) 110   Temp 97.6 F (36.4 C) (Oral)   Resp 18   Ht 5\' 1"  (1.549 m)   Wt 55 kg   SpO2 98%   BMI 22.91 kg/m   Awake, alert, confused Speech fluent CNs grossly intact  Bilateral upper/lower extremities symmetric and full strength  ASSESSMENT:  79 y.o. female with  Subdural hematoma  PLAN: -CT stable, neuro exam stable.  Continue anticoagulation as planned -PT/OT -Continue supportive care   Thank you for allowing me to participate in this patient's care.  Please do not hesitate to call with questions or concerns.   Elwin Sleight, [facility] Cell: [phone]

## 2020-03-13 NOTE — Progress Notes (Addendum)
Patient report given to Rehab RN before lunch; patient transfer after her eating lunch; accompanied by her daughter to [phone]. Patient traveled by wheelchair; was placed in bed with bed alarm on; daughter present at bedside; RN called to room.
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
## 2020-03-14 ENCOUNTER — Inpatient Hospital Stay (HOSPITAL_COMMUNITY): Payer: Medicare Other | Admitting: Physical Therapy

## 2020-03-14 ENCOUNTER — Inpatient Hospital Stay (HOSPITAL_COMMUNITY): Payer: Medicare Other | Admitting: Speech Pathology

## 2020-03-14 ENCOUNTER — Inpatient Hospital Stay (HOSPITAL_COMMUNITY): Payer: Medicare Other

## 2020-03-14 DIAGNOSIS — S069X1S Unspecified intracranial injury with loss of consciousness of 30 minutes or less, sequela: Secondary | ICD-10-CM | POA: Diagnosis not present

## 2020-03-14 DIAGNOSIS — S8001XS Contusion of right knee, sequela: Secondary | ICD-10-CM | POA: Diagnosis not present

## 2020-03-14 DIAGNOSIS — I480 Paroxysmal atrial fibrillation: Secondary | ICD-10-CM

## 2020-03-14 DIAGNOSIS — I10 Essential (primary) hypertension: Secondary | ICD-10-CM
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
## 2020-03-14 NOTE — Progress Notes (Signed)
   03/14/20 0801  Assess: MEWS Score  BP 103/74  Pulse Rate (!) 111  Assess: MEWS Score  MEWS Temp 0  MEWS Systolic 0  MEWS Pulse 2  MEWS RR 0  MEWS LOC 0  MEWS Score 2  MEWS Score Color Yellow  Assess: if the MEWS score is Yellow or Red  Were vital signs taken at a resting state? Yes  Focused Assessment No change from prior assessment  Early Detection of Sepsis Score *See Row Information* Low  MEWS guidelines implemented *See Row Information* No, vital signs rechecked  Treat  MEWS Interventions Administered scheduled meds/treatments  Pain Scale 0-10  Pain Score 0    Rechecked HR manual Apical   0813 HR 104
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
## 2020-03-15 ENCOUNTER — Inpatient Hospital Stay (HOSPITAL_COMMUNITY): Payer: Medicare Other

## 2020-03-15 ENCOUNTER — Inpatient Hospital Stay (HOSPITAL_COMMUNITY): Payer: Medicare Other | Admitting: Speech Pathology

## 2020-03-15 DIAGNOSIS — S069X1S Unspecified intracranial injury with loss of consciousness of 30 minutes or less, sequela: Secondary | ICD-10-CM | POA: Diagnosis not present

## 2020-03-15 LAB — COMPREHENSIVE METABOLIC PANEL
ALT: 41 U/L (ref 0–44)
AST: 36 U/L (ref 15–41)
Albumin: 3.6 g/dL (ref 3.5–5.0)
Alkaline Phosphatase: 87 U/L (ref 38–126)
Anion gap: 10 (ref 5–15)
BUN: 13 mg/dL (ref 8–23)
CO2: 26 mmol/L (ref 22–32)
Calcium: 9.1 mg/dL (ref 8.9–10.3)
Chloride: 104 mmol/L (ref 98–111)
Creatinine, Ser: 0.69 mg/dL (ref 0.44–1.00)
GFR, Estimated: >60 mL/min (ref >60)
Glucose, Bld: 104 mg/dL — ABNORMAL HIGH (ref 70–99)
Potassium: 4.2 mmol/L (ref 3.5–5.1)
Sodium: 140 mmol/L (ref 135–145)
Total Bilirubin: 1.4 mg/dL — ABNORMAL HIGH (ref 0.3–1.2)
Total Protein: 7 g/dL (ref 6.5–8.1)

## 2020-03-15 LAB — CBC WITH DIFFERENTIAL/PLATELET
Abs Immature Granulocytes: 0.08 10*3/uL — ABNORMAL HIGH (ref 0.00–0.07)
Basophils Absolute: 0 10*3/uL (ref 0.0–0.1)
Basophils Relative: 0 %
Eosinophils Absolute: 0.1 10*3/uL (ref 0.0–0.5)
Eosinophils Relative: 2 %
HCT: 28 % — ABNORMAL LOW (ref 36.0–46.0)
Hemoglobin: 9.3 g/dL — ABNORMAL LOW (ref 12.0–15.0)
Immature Granulocytes: 2 %
Lymphocytes Relative: 26 %
Lymphs Abs: 1.2 10*3/uL (ref 0.7–4.0)
MCH: 32 pg (ref 26.0–34.0)
MCHC: 33.2 g/dL (ref 30.0–36.0)
MCV: 96.2 fL (ref 80.0–100.0)
Monocytes Absolute: 0.4 10*3/uL (ref 0.1–1.0)
Monocytes Relative: 9 %
Neutro Abs: 2.9 10*3/uL (ref 1.7–7.7)
Neutrophils Relative %: 61 %
Platelets: 388 10*3/uL (ref 150–400)
RBC: 2.91 MIL/uL — ABNORMAL LOW (ref 3.87–5.11)
RDW: 14.9 % (ref 11.5–15.5)
WBC: 4.8 10*3/uL (ref 4.0–10.5)
nRBC: 0 % (ref 0.0–0.2)

## 2020-03-15 MED ORDER — SORBITOL 70 % SOLN
30.0000 mL | Freq: Once | Status: AC
  Administered 2020-03-15: 30 mL via ORAL
  Filled 2020-03-15: qty 30

## 2020-03-15 NOTE — Progress Notes (Signed)
Speech Language Pathology Daily Session Note  Patient Details  Name: Hannah Roy MRN: [id number] Date of Birth: 1940/07/06  Today's Date: 03/15/2020 SLP Individual Time: 1105-1200 SLP Individual Time Calculation (min): 55 min  Short Term Goals: Week 1: SLP Short Term Goal 1 (Week 1): STG=LTG due to ELOS  Skilled Therapeutic Interventions: Skilled treatment session focused on cognitive goals. SLP facilitated session by administering the [place] Status Examination (SLUMS) and patient scored 13/26 points with deficits noted in problem solving and memory. Although patient scored low on the cognitive assessment today, she recalled events from therapy sessions such as exercises, etc with overall Min verbal cues. Patient reported she managed her own medications and used cash when shopping and paying bills, therefore, SLP will focus on basic and functional tasks that are familiar to the patient. Patient left upright in recliner with alarm on and all needs within reach. Continue with current plan of care.      Pain No/Denies Pain   Therapy/Group: Individual Therapy  Bowden Boody 03/15/2020, 12:33 PM

## 2020-03-15 NOTE — Progress Notes (Signed)
Patient Details  Name: Hannah Roy MRN: [id number] Date of Birth: [birth year]  Today's Date: 03/15/2020  Hospital Problems: Principal Problem:   Traumatic brain injury [facility])  Past Medical History:  Past Medical History:  Diagnosis Date  . Aortic atherosclerosis ([place])   . 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 Atypical atrial flutter ([place])   . Colon polyps   . Essential hypertension   . History of seasonal allergies   . Hypertension   . Hypothyroidism   . Osteopenia 2006  . Persistent atrial fibrillation ([place])   . Rheumatic valvular disease   . 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   21 mm [facility] Ease stented bovine pericardial tissue valve  . S/P ascending aortic aneurysm repair 09/05/2018   28 mm Hemashield platinum supracoronary straight graft  . S/P Maze operation for atrial fibrillation 09/05/2018   Complete bilateral atrial lesion set using bipolar radiofrequency and cryothermy ablation with clipping of LA appendage  . 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   29 mm [facility] Mitral stented bovine pericardial tissue valve  . 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   28 mm Edwards mc3 ring annuloplasty   Past Surgical History:  Past Surgical History:  Procedure Laterality Date  . AORTIC VALVE REPLACEMENT N/A 09/05/2018   Procedure: AORTIC VALVE REPLACEMENT (AVR) USING MAGNA EASE 21MM AOTIC BIOPROSTHESIS VALVE;  Surgeon: Rexene Alberts, MD;  Location: [place];  Service: Open Heart Surgery;  Laterality: N/A;  . APPENDECTOMY    . BUBBLE STUDY  01/30/2020   Procedure: BUBBLE STUDY;  Surgeon: Elouise Munroe, MD;  Location: [place];  Service: Cardiovascular;;  . CARDIOVERSION N/A 01/30/2020   Procedure: CARDIOVERSION;  Surgeon: Elouise Munroe, MD;  Location: [facility] ENDOSCOPY;  Service: Cardiovascular;  Laterality: N/A;  . CLIPPING OF ATRIAL APPENDAGE  09/05/2018   Procedure: Clipping Of Atrial Appendage;   Surgeon: Rexene Alberts, MD;  Location: [facility] OR;  Service: Open Heart Surgery;;  . COLONOSCOPY  03/23/2011   repeat in 5 years,Procedure: COLONOSCOPY;  Surgeon: Rogene Houston, MD;  [facility];  Service: Endoscopy;  Laterality: N/A;  1:00  . COLONOSCOPY N/A 11/09/2016   Procedure: COLONOSCOPY;  Surgeon: Rogene Houston, MD;  [facility];  Service: Endoscopy;  Laterality: N/A;  1030  . ESOPHAGOGASTRODUODENOSCOPY N/A 10/10/2018   Procedure: ESOPHAGOGASTRODUODENOSCOPY (EGD);  Surgeon: Doran Stabler, MD;  Location: [place];  Service: Gastroenterology;  Laterality: N/A;  . ESOPHAGOGASTRODUODENOSCOPY (EGD) WITH PROPOFOL N/A 10/08/2018   Procedure: ESOPHAGOGASTRODUODENOSCOPY (EGD) WITH PROPOFOL;  Surgeon: Danie Binder, MD;  [facility];  Service: Endoscopy;  Laterality: N/A;  . ESOPHAGOGASTRODUODENOSCOPY (EGD) WITH PROPOFOL N/A 11/15/2018   Procedure: ESOPHAGOGASTRODUODENOSCOPY (EGD) WITH PROPOFOL;  Surgeon: Rogene Houston, MD;  [facility];  Service: Endoscopy;  Laterality: N/A;  12:55  . HOT HEMOSTASIS N/A 10/10/2018   Procedure: HOT HEMOSTASIS (ARGON PLASMA COAGULATION/BICAP);  Surgeon: Doran Stabler, MD;  Location: [place];  Service: Gastroenterology;  Laterality: N/A;  . IR ANGIOGRAM SELECTIVE EACH ADDITIONAL VESSEL  10/08/2018  . IR ANGIOGRAM VISCERAL SELECTIVE  10/08/2018  . IR EMBO ART  VEN HEMORR LYMPH EXTRAV  INC GUIDE ROADMAPPING  10/08/2018  . IR US GUIDE VASC ACCESS RIGHT  10/08/2018  . MAZE N/A 09/05/2018   Procedure: MAZE;  Surgeon: Rexene Alberts, MD;  Location: [place];  Service: Open Heart Surgery;  Laterality: N/A;  . MITRAL VALVE  REPLACEMENT N/A 09/05/2018   Procedure: MITRAL VALVE (MV) REPLACEMENT USING MAGNA MITRAL EASE 29MM BIOPROSTHESIS VALVE;  Surgeon: Rexene Alberts, MD;  Location: [place];  Service: Open Heart Surgery;  Laterality: N/A;  . REPLACEMENT ASCENDING AORTA N/A 09/05/2018   Procedure: SUPRACORONARY  STRAIGHT GRAFT REPLACEMENT OF ASCENDING AORTA;  Surgeon: Rexene Alberts, MD;  Location: [place];  Service: Open Heart Surgery;  Laterality: N/A;  . RIGHT/LEFT HEART CATH AND CORONARY ANGIOGRAPHY N/A 08/29/2018   Procedure: RIGHT/LEFT HEART CATH AND CORONARY ANGIOGRAPHY;  Surgeon: Lorretta Harp, MD;  Location: [place] CV LAB;  Service: Cardiovascular;  Laterality: N/A;  . TEE WITHOUT CARDIOVERSION N/A 08/29/2018   Procedure: TRANSESOPHAGEAL ECHOCARDIOGRAM (TEE);  Surgeon: Skeet Latch, MD;  Location: [place];  Service: Cardiovascular;  Laterality: N/A;  . TEE WITHOUT CARDIOVERSION N/A 01/30/2020   Procedure: TRANSESOPHAGEAL ECHOCARDIOGRAM (TEE);  Surgeon: Elouise Munroe, MD;  Location: [facility] ENDOSCOPY;  Service: Cardiovascular;  Laterality: N/A;  . TRICUSPID VALVE REPLACEMENT N/A 09/05/2018   Procedure: TRICUSPID VALVE REPAIR USING EDWARDS MC3 TRICUSPID ANNULOPLASTY RING SIZE T28;  Surgeon: Rexene Alberts, MD;  Location: [place];  Service: Open Heart Surgery;  Laterality: N/A;  . TUBAL LIGATION     Social History:  reports that she has quit smoking. Her smoking use included cigarettes. She has a 5.00 pack-year smoking history. She has never used smokeless tobacco. She reports that she does not drink alcohol and does not use drugs.  Family / Support Systems Marital Status: Married How Long?: 30 years Patient Roles: Spouse, Parent Spouse/Significant Other: Chrissie Noa Children: Lattie Haw [phone]); lives in [place], [place] Other Supports: None reported Anticipated Caregiver: Per EMR, pt dtr to arrange 24/7 care Ability/Limitations of Caregiver: none reported Caregiver Availability: 24/7 Family Dynamics: Pt lives with her husband  Social History Preferred language: English Religion:  Cultural Background: Pt worked in [employer] operation for 30 years. Education: high school Read: Yes Write: Yes Employment Status: Retired Public relations account executive Issues: Denies Guardian/Conservator:  N/A   Abuse/Neglect Abuse/Neglect Assessment Can Be Completed: Yes Physical Abuse: Denies Verbal Abuse: Denies Sexual Abuse: Denies Exploitation of patient/patient's resources: Denies Self-Neglect: Denies  Emotional Status Pt's affect, behavior and adjustment status: Pt in good spirits at time of visit Recent Psychosocial Issues: Denies Psychiatric History: Denies Substance Abuse History: Denies  Patient / Family Perceptions, Expectations & Goals Pt/Family understanding of illness & functional limitations: pt dtr Lattie Haw has general understanding of pt care needs Premorbid pt/family roles/activities: Independent` Anticipated changes in roles/activities/participation: 24/7 care; supervision  [employer]: None Premorbid Home Care/DME Agencies: None Transportation available at discharge: dtr [employer] referrals recommended: Neuropsychology  Discharge Planning Living Arrangements: Spouse/significant other Support Systems: Children, Spouse/significant other, Other relatives Type of Residence: Private residence Insurance Resources: [employer], Multimedia programmer (specify) ([employer] Supplement) Museum/gallery curator Resources: Radio broadcast assistant Screen Referred: No Living Expenses: Medical laboratory scientific officer Management: Spouse Does the patient have any problems obtaining your medications?: No Home Management: Pt reports that she and husband eat out often; otherwise they may small meals Care Coordinator Barriers to Discharge: Decreased caregiver support, Lack of/limited family support Care Coordinator Barriers to Discharge Comments: Husband is only able to provide supervision level of care due to physical strength limitations (bone cancer currently in remission). Care Coordinator Anticipated Follow Up Needs: HH/OP Expected length of stay: 10-12 days  Clinical Impression SW met with pt in room to introduce self, explain role, and discuss discharge process. No HCPOA.  No DME. Pt aware SW to  follow-up with her dtr Lattie Haw to discuss discharge plan.   SW spoke with pt dtr Lattie Haw [phone]) to discuss pt d/c. Reports pt will have 24/7 care between herself and adult grandson. States her stepfather is only able to provide supervision level of care. SW informed there will be follow-up tomorrow after team conference.   Latoyia Tecson A Abri Vacca 03/15/2020, 2:08 PM

## 2020-03-15 NOTE — Care Management (Signed)
Inpatient Rehabilitation Center Individual Statement of Services  Patient Name:  Hannah Roy  Date:  03/15/2020  Welcome to the [place].  Our goal is to provide you with an individualized program based on your diagnosis and situation, designed to meet your specific needs.  With this comprehensive rehabilitation program, you will be expected to participate in at least 3 hours of rehabilitation therapies Monday-Friday, with modified therapy programming on the weekends.  Your rehabilitation program will include the following services:  Physical Therapy (PT), Occupational Therapy (OT), Speech Therapy (ST), 24 hour per day rehabilitation nursing, Therapeutic Recreaction (TR), Psychology, Neuropsychology, Care Coordinator, Rehabilitation Medicine, Nutrition Services, Pharmacy Services and Other  Weekly team conferences will be held on Tuesdays to discuss your progress.  Your Inpatient Rehabilitation Care Coordinator will talk with you frequently to get your input and to update you on team discussions.  Team conferences with you and your family in attendance may also be held.  Expected length of stay: 10-12 days   Overall anticipated outcome: Supervision   Depending on your progress and recovery, your program may change. Your Inpatient Rehabilitation Care Coordinator will coordinate services and will keep you informed of any changes. Your Inpatient Rehabilitation Care Coordinator's name and contact numbers are listed  below.  The following services may also be recommended but are not provided by the [place] will be made to provide these services after discharge if needed.  Arrangements include referral to agencies that provide these services.  Your insurance has been verified to be:  Medicare A/B  Your primary  doctor is:  Sallee Lange  Pertinent information will be shared with your doctor and your insurance company.  Inpatient Rehabilitation Care Coordinator:  Cathleen Corti [phone] or [phone]  Information discussed with and copy given to patient by: Rana Snare, 03/15/2020, 8:51 AM

## 2020-03-15 NOTE — Progress Notes (Signed)
Occupational Therapy Session Note  Patient Details  Name: Hannah Roy MRN: [id number] Date of Birth: 08/18/1940  Today's Date: 03/15/2020 OT Individual Time: 0730-0830 OT Individual Time Calculation (min): 60 min   Session 2: OT Individual Time: [phone] OT Individual Time Calculation (min): 25 min    Short Term Goals: Week 1:  OT Short Term Goal 1 (Week 1): Pt will don pants with CGA OT Short Term Goal 2 (Week 1): Pt will require no more than min cueing to identify self care items on the R side OT Short Term Goal 3 (Week 1): Pt will don shirt with CGA OT Short Term Goal 4 (Week 1): Pt will correctly use self care items with no cueing  Skilled Therapeutic Interventions/Progress Updates:    Session 1: Pt received supine, eager and ready for therapy. Soft collar donned. Pt declining ADLs. Pt completed bed mobility with supervision, with mod A provided to don soft collar. Pt completed ambulatory transfer to the sink and completed grooming task in standing with CGA. She required min cueing for full head scan to the R to locate items. Min cueing to incorporate RUE as a stabilizer for applying toothpaste. Pt completed toileting tasks with CGA overall, voiding urine only. Poor awareness/attention to the R side. She then completed 100 ft of functional mobility to the ADL apt with CGA. She completed functional reaching task with the RUE and R scanning component. Pt then completed several visual perception activities on the BITS system. Pt scores 66% accuracy on smooth pursuits L to R with R functional reach, requiring min facilitation to achieve shoulder flexion to 100 degrees. 31.9 % accuracy in functional reaching with random pursuits with rotations activity, requiring more accurate reach and finger isolation. Pt returned to her room and was left sitting up with all needs met, chair pad alarm set.   Session 2: Pt received in recliner with no c/o pain. Soft collar on. Pt completed 150 ft of  functional mobility to the therapy gym with CGA. She completed bimanual laundry simulation task with visual scanning component to compensate for R neglect. Pt was able to hold slideboard with BUE and required min A for problem solving through how to adjust board to release one hand to grab laundry items. Min-mod cueing overall for R scanning. Pt then sat and completed laundry folding with overall fair accuracy. Pt stood at the full length mirror and completed line bisection task, holding a large marker in her RUE and scanning mirror with min cueing. Pt returned to her room and was left sitting up with all needs met, chair pad alarm set.   Therapy Documentation Precautions:  Precautions Precautions: Fall, Cervical Precaution Comments: soft collar Required Braces or Orthoses: Cervical Brace Cervical Brace: Soft collar, At all times Restrictions Weight Bearing Restrictions: No  Therapy/Group: Individual Therapy  Curtis Sites 03/15/2020, 6:40 AM
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
## 2020-03-15 NOTE — Progress Notes (Signed)
Inpatient Rehabilitation  Patient information reviewed and entered into eRehab system by Myrtle Haller M. Kayleann Mccaffery, M.A., CCC/SLP, PPS Coordinator.  Information including medical coding, functional ability and quality indicators will be reviewed and updated through discharge.    
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
## 2020-03-15 NOTE — Plan of Care (Signed)
  Problem: RH BOWEL ELIMINATION Goal: RH STG MANAGE BOWEL WITH ASSISTANCE Description: STG Manage Bowel with min Assistance. Outcome: Progressing   Problem: RH BLADDER ELIMINATION Goal: RH STG MANAGE BLADDER WITH ASSISTANCE Description: STG Manage Bladder With min Assistance Outcome: Progressing

## 2020-03-15 NOTE — Progress Notes (Signed)
Physical Therapy Session Note  Patient Details  Name: Hannah Roy MRN: [id number] Date of Birth: 1940/09/10  Today's Date: 03/15/2020 PT Individual Time: 1300-1400 PT Individual Time Calculation (min): 60 min   Short Term Goals: Week 1:  PT Short Term Goal 1 (Week 1): Pt will perform least restrictive transfer with CGA consistently PT Short Term Goal 2 (Week 1): Pt will ambulate x 150 ft with LRAD and CGA consistently PT Short Term Goal 3 (Week 1): Pt will navigate 8 steps with one handrail and min A  Skilled Therapeutic Interventions/Progress Updates:     Patient in the bathroom with NT performing toileting upon PT arrival. Patient alert and agreeable to PT session. Patient denied pain during session. Patient asking about when she can take off her soft collar, will discuss with medical team during team conference tomorrow.   Patient demonstrated decreased spatial awareness, mild R inattention, and undershooting with basic motor tasks throughout session.   Therapeutic Activity: Bed Mobility: Patient performed sit to supine independently in a flat bed without use of bed rails.  Transfers: Patient performed sit to/from stand x9 and toilet transfer x1 with close supervision for safety. Provided verbal cues for controlled descent x2 and scooting forward to stand to reduce increased trials to stand x1. Five times Sit to Stand Test (FTSS) Method: Use a straight back chair with a solid seat that is 16-18" high. Ask participant to sit on the chair with arms folded across their chest.   Instructions: "Stand up and sit down as quickly as possible 5 times, keeping your arms folded across your chest."   Measurement: Stop timing when the participant stands the 5th time.  TIME: __11.8____ (in seconds)  Times > 13.6 seconds is associated with increased disability and morbidity (Guralnik, 2000) Times > 15 seconds is predictive of recurrent falls in healthy individuals aged 66 and older (Buatois, et  al., 2008) Normal performance values in community dwelling individuals aged 4 and older (Bohannon, 2006): o 60-69 years: 11.4 seconds o 70-79 years: 12.6 seconds o 80-89 years: 14.8 seconds  MCID: ? 2.3 seconds for Vestibular Disorders (Meretta, 2006)  Gait Training:  Patient ambulated ~60 feet x2 and ~130 feet x2 without an AD with CGA for safety. Ambulated with decreased R arm swing, decreased R hip/knee flexion in swing with intermittent toe catching, and decreased visual scanning of environment, limited some by cervical precautions, but also with visual/perceptual limitation. Provided verbal cues for increased R arm swing with equal swing from R/L for improved balance and gait speed, increased L weight shift and hamstring activation for improved toe clearance on R, and increased visual scanning with cues for identification of objects and path finding during gait.  Neuromuscular Re-ed: Patient performed the following the Functional Gait Assessment (see details below): Patient demonstrates increased fall risk as noted by score of 10/30, or 10/22 with head turns taken out due to cervical precautions, on Functional Gait Assessment.  (<19=increased risk for falls with ambulation)  Therapeutic Exercise: Patient performed the following assessment for functional endurance with gait: 6 Min Walk Test:  Instructed patient to ambulate as quickling and as safely as possible for 6 minutes using LRAD. Patient was allowed to take standing rest breaks without stopping the test, but if he required a sitting rest break the clock would be stopped and the test would be over.  Results: 855 feet, 5/10 RPE  Reviewed results and interpretation of all assessments performed with patient. Patient attentive and stated understanding of results.  Patient preparing to lie in the bed at end of session when she spontaneously stood stating she needed to change clothes. Provided cues for reasoning to determine if clothes  needed changed now or after bathing with nursing or OT. Patient selected to wait until after bathing/showering, however, preceded to obtain clothes form closet with min A to complete task due to undershooting and motor apraxia.   Patient in bed at end of session with breaks locked, bed alarm set, and all needs within reach.    Therapy Documentation Precautions:  Precautions Precautions: Fall, Cervical Precaution Comments: soft collar Required Braces or Orthoses: Cervical Brace Cervical Brace: Soft collar, At all times Restrictions Weight Bearing Restrictions: No Balance: Standardized Balance Assessment Standardized Balance Assessment: Functional Gait Assessment Functional Gait  Assessment Gait Level Surface: Walks 20 ft, slow speed, abnormal gait pattern, evidence for imbalance or deviates 10-15 in outside of the 12 in walkway width. Requires more than 7 sec to ambulate 20 ft. Change in Gait Speed: Makes only minor adjustments to walking speed, or accomplishes a change in speed with significant gait deviations, deviates 10-15 in outside the 12 in walkway width, or changes speed but loses balance but is able to recover and continue walking. Gait with Horizontal Head Turns: Performs head turns with moderate changes in gait velocity, slows down, deviates 10-15 in outside 12 in walkway width but recovers, can continue to walk. (unable due to soft c-collar in place) Gait with Vertical Head Turns: Performs task with severe disruption of gait (eg, staggers 15 in outside 12 in walkway width, loses balance, stops, reaches for wall). (unable due to soft c-collar in place) Gait and Pivot Turn: Turns slowly, requires verbal cueing, or requires several small steps to catch balance following turn and stop Step Over Obstacle: Is able to step over one shoe box (4.5 in total height) but must slow down and adjust steps to clear box safely. May require verbal cueing. Gait with Narrow Base of Support: Is able to  ambulate for 10 steps heel to toe with no staggering. Gait with Eyes Closed: Walks 20 ft, slow speed, abnormal gait pattern, evidence for imbalance, deviates 10-15 in outside 12 in walkway width. Requires more than 9 sec to ambulate 20 ft. Ambulating Backwards: Walks 20 ft, slow speed, abnormal gait pattern, evidence for imbalance, deviates 10-15 in outside 12 in walkway width. Steps: Two feet to a stair, must use rail. (step-to leading with R with R rail) Total Score: 10/30 or 10/22 (due to cervical precautions)   Therapy/Group: Individual Therapy  Allyna Pittsley L Hawkins Seaman PT, DPT  03/15/2020, 3:43 PM

## 2020-03-16 ENCOUNTER — Inpatient Hospital Stay (HOSPITAL_COMMUNITY): Payer: Medicare Other

## 2020-03-16 ENCOUNTER — Inpatient Hospital Stay (HOSPITAL_COMMUNITY): Payer: Medicare Other | Admitting: Speech Pathology

## 2020-03-16 MED ORDER — SENNOSIDES-DOCUSATE SODIUM 8.6-50 MG PO TABS
2.0000 | ORAL_TABLET | Freq: Every day | ORAL | Status: DC
  Administered 2020-03-16 – 2020-03-22 (×6): 2 via ORAL
  Filled 2020-03-16 (×7): qty 2

## 2020-03-16 MED ORDER — METOPROLOL TARTRATE 50 MG PO TABS
50.0000 mg | ORAL_TABLET | Freq: Two times a day (BID) | ORAL | Status: DC
  Administered 2020-03-16 – 2020-03-19 (×5): 50 mg via ORAL
  Filled 2020-03-16 (×7): qty 1

## 2020-03-16 NOTE — Plan of Care (Signed)
Behavioral Plan: Hannah Roy  [place]o Level: VII  Behavior to decrease/ eliminate:  - Confusion - Reduce fall risk  Changes to environment:  - Blinds open, lights on during the day  - Provide orientation when needed  Interventions: - Provide opportunities to be out of bed  - Chair alarm when in recliner  - All needs within reach   Recommendations for interactions with patient: - Continue standard plan of care    Attendees:  Laverle Hobby, OT Weston Anna, SLP Apolinar Junes, PT
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
## 2020-03-16 NOTE — Plan of Care (Signed)
  Problem: RH BOWEL ELIMINATION Goal: RH STG MANAGE BOWEL WITH ASSISTANCE Description: STG Manage Bowel with min Assistance. Outcome: Progressing   Problem: RH BLADDER ELIMINATION Goal: RH STG MANAGE BLADDER WITH ASSISTANCE Description: STG Manage Bladder With min Assistance Outcome: Progressing

## 2020-03-16 NOTE — Progress Notes (Addendum)
Physical Therapy Session Note  Patient Details  Name: Hannah Roy MRN: [id number] Date of Birth: 10-04-1940  Today's Date: 03/16/2020 PT Individual Time: 0800-0900 PT Individual Time Calculation (min): 60 min   Short Term Goals: Week 1:  PT Short Term Goal 1 (Week 1): Pt will perform least restrictive transfer with CGA consistently PT Short Term Goal 2 (Week 1): Pt will ambulate x 150 ft with LRAD and CGA consistently PT Short Term Goal 3 (Week 1): Pt will navigate 8 steps with one handrail and min A  Skilled Therapeutic Interventions/Progress Updates:     Patient in bed upon PT arrival. Patient alert and agreeable to PT session. Patient denied pain during session. MD rounded during session, patient asked about soft collar. Per MD patient may doff in sitting or lying and wear soft collar for comfort with mobility.  Applied Kinesiotape over R knee and calf using lymphatic drainage technique with the aim of reducing swelling and bruising. Prior to application, patient denied any history of skin irritation or allergy to adhesive. Educated patient on purpose of kinesiotape placement and signs symptoms of allergic reaction or irritation. Cleaned patient's skin and applied test strip at beginning of session and removed at end of session without sings of skin irritation. Instructed patient that the tape can be left on up to 3 days and can be worn in the shower. Instructed to removed the tape if peeling off, signs of skin irritation arise, or it has been on for >3 days. Informed patient that the tape is best removed in the shower or with a wet wash cloth. Patient stated understanding. Rehab team informed about tape placement to assist with monitoring patient's response due to patient having cognitive deficits following TBI.  Therapeutic Activity: Bed Mobility: Patient performed supine to sit with mod I.  Transfers: Patient performed sit to/from stand with supervision for safety throughout session  without AD. Provided verbal cues for safety awareness - reaching back to sit and waiting for therapist to be ready before standing due to mild impulsivity.  Gait Training:  Patient ambulated ~150 feet x2 without AD with CGA progressing to close supervision. Ambulated with improved R arm swing today without cues, provided positive reinforcement, and R toe catching x2 due to decreased hip/knee flexion in swing R>L. Provided verbal cues for increased hamstring activation and increased L weight shift to improve foot clearance for safety.  Neuromuscular Re-ed: Patient performed the following dynamic gait and dual task activities and 1 upper extremity motor control task: -Regular Timed Up and Go (TUG): stand, walk 31ft around a cone 60ft to chair and sit as quickly and as safely as possible, 14.6 sec -Motor Dual task TUG (holding full cup of water): 13.8 sec -Cognitive Dual task TUG (counting backwards by 1's, unable to count backwards by 3's): 15.6, 5 correct responses -Counting backwards by 1's seated for 15.6 sec: 7 correct responses -Weaving between 7 cones spaced 1.5 ft apart: Trial 1: 37 sec hi 1 cone, Trial 2: 32 sec hit 1 cone, Trial 3 27 sec hit 1 cone -Stepping over 7 medium cones spaced 1.5 ft apart with reciprocal gait pattern: Trial 1: not timed hit 4 cone, Trial 2: not times hit 0 cone, Trial 3 with cues for increased speed: 12.7 sec hit 2 cones, Trial 4 with cues for increased speed: 13.2 sec hit 1 cone -donned gloves with set-up assist on L and mod A on R with poor dexterity and awareness of where to place each finger  in the glove -wiped down 7 cones following cues to pick up cones on her R wit her R hand then hold the cone in her L hand while using the R to wipe down each cone thouroughly, required increased time and intermittent cues for thoroughness, also dropped the wipe from her R hand x3  Patient in recliner with step under her feet for improved sitting tolerance at end of session  with breaks locked, chair alarm set, and all needs within reach.   Therapy Documentation Precautions:  Precautions Precautions: Fall, Cervical Precaution Comments: soft collar Required Braces or Orthoses: Cervical Brace Cervical Brace: Soft collar, At all times Restrictions Weight Bearing Restrictions: No   Therapy/Group: Individual Therapy  Bitania Shankland L Daysie Helf PT, DPT  03/16/2020, 3:32 PM

## 2020-03-16 NOTE — Progress Notes (Signed)
Patient ID: Hannah Roy, female   DOB: Sep 25, 1940, 79 y.o.   MRN: [id number]  SW spoke with pt dtr Lattie Haw [phone]) to provide updates on team conference, d/c date 12/14, and d/c recommendations still pending. Outpatient preferred location is [facility]. SW informed about DME: [employer]. SW will follow-up if any further DME. Reports she should be the primary contact for any co-pays with vendor as she is currently managing patient finances. Fam edu scheduled tomorrow 12/9 8am-12pm.   Loralee Pacas, MSW, [place] Office: [phone] Cell: [phone] Fax: [phone]
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
## 2020-03-16 NOTE — Progress Notes (Signed)
Speech Language Pathology Daily Session Note  Patient Details  Name: HADLEIGH FELBER MRN: [id number] Date of Birth: 1940-10-06  Today's Date: 03/16/2020 SLP Individual Time: 5449-2010 SLP Individual Time Calculation (min): 55 min  Short Term Goals: Week 1: SLP Short Term Goal 1 (Week 1): STG=LTG due to ELOS  Skilled Therapeutic Interventions: Skilled treatment session focused on cognitive goals. SLP facilitated session by providing Max A multimodal cues for problem solving and recall during a basic money management task. Patient aware of difficulty of task and reported, "its just not registering." SLP provided ego support. Patient requested to use the bathroom and ambulated with CGA. Patient also performed all self-care tasks with supervision. Patient left upright in the recliner with alarm on and all needs within reach. Continue with current plan of care.      Pain No/Denies Pain  Therapy/Group: Individual Therapy  Tiannah Greenly 03/16/2020, 3:04 PM
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
## 2020-03-16 NOTE — Progress Notes (Signed)
Occupational Therapy Session Note  Patient Details  Name: Hannah Roy MRN: [id number] Date of Birth: 1941-03-18  Today's Date: 03/16/2020 OT Individual Time: 1035-1130 OT Individual Time Calculation (min): 55 min   Session 2:  OT Individual Time: 4158-3094 OT Individual Time Calculation (min): 30 min    Short Term Goals: Week 1:  OT Short Term Goal 1 (Week 1): Pt will don pants with CGA OT Short Term Goal 2 (Week 1): Pt will require no more than min cueing to identify self care items on the R side OT Short Term Goal 3 (Week 1): Pt will don shirt with CGA OT Short Term Goal 4 (Week 1): Pt will correctly use self care items with no cueing  Skilled Therapeutic Interventions/Progress Updates:    Session 1: Pt received sittng in recliner with daughter present. Reviewed CLOF, equipment recomendations, and d/c date/planning. Both very pleased with her progress. Pt completed ambulatory transfer to the toilet with CGA. She completed toileting tasks at close supervision. Cueing required for safety when doffing clothing. Pt transferred into shower- using shower chair with CGA. All bathing completed at close supervision level. Min cueing for safety when drying off. Pt donned underwear and pants with CGA, sit <> stand. Shirt donned with min A with buttons and cueing for RUE use. Great improvement in RUE spontaneous use. Grooming and oral care completed in standing with supervision. In the therapy gym pt completed seated level RUE [facility] task with playing cards and push pins. Cueing provided for reduced shoulder compensation and for visual attention to task. Pt with good improvements in [facility] overall. Pt returned to her room and was left sitting up in the recliner with all needs met. Chair alarm set.   Session 2: Pt received in recliner with no c/o pain. 100 ft of functional mobility with close supervision, one slight LOB from R LE catching but pt able to correct herself. Pt completed BITS system with activity  focused on alternating arm swing with 80 bpm metronome. Pt required significant cueing to follow along with difficulty sequencing and achieving fluid arm swing. Pt then completed memory task with 2-3 item memory sequence, with moderate cueing required, obvious working memory deficits. BUE strengthening circuit completed with a 3 lb dowel, cueing provided for technique and for cervical precaution adherence. Pt returned to her room and was left sitting in the recliner with all needs met, chair alarm set.   Therapy Documentation Precautions:  Precautions Precautions: Fall, Cervical Precaution Comments: soft collar Required Braces or Orthoses: Cervical Brace Cervical Brace: Soft collar, At all times Restrictions Weight Bearing Restrictions: No  Therapy/Group: Individual Therapy  Curtis Sites 03/16/2020, 6:43 AM

## 2020-03-17 ENCOUNTER — Inpatient Hospital Stay (HOSPITAL_COMMUNITY): Payer: Medicare Other

## 2020-03-17 ENCOUNTER — Inpatient Hospital Stay (HOSPITAL_COMMUNITY): Payer: Medicare Other | Admitting: Speech Pathology

## 2020-03-17 DIAGNOSIS — I4892 Unspecified atrial flutter: Secondary | ICD-10-CM | POA: Diagnosis not present

## 2020-03-17 NOTE — Progress Notes (Signed)
Occupational Therapy Session Note  Patient Details  Name: Hannah Roy MRN: [id number] Date of Birth: Aug 19, 1940  Today's Date: 03/17/2020 OT Individual Time: 9485-4627 OT Individual Time Calculation (min): 60 min    Short Term Goals: Week 1:  OT Short Term Goal 1 (Week 1): Pt will don pants with CGA OT Short Term Goal 2 (Week 1): Pt will require no more than min cueing to identify self care items on the R side OT Short Term Goal 3 (Week 1): Pt will don shirt with CGA OT Short Term Goal 4 (Week 1): Pt will correctly use self care items with no cueing  Skilled Therapeutic Interventions/Progress Updates:    Pt received supine with no c/o pain. Pt did recall MD conversation yesterday re cervical collar use OOB. Pt completed bed mobility with mod I. Ambulatory transfer around the room and to toilet with CGA, fading to supervision. Pt completed toileting tasks, voiding BM with CGA. Transfer into shower with CGA. Pt completed all bathing, mostly standing, with distal feet bathing seated with close supervision. Much improved RUE use. Min cueing for safety. Pt completed transfer to EOB where she donned underwear with supervision, shirt with buttons with min A to button. Pt completed oral care in standing with supervision, cueing required to use cup instead of cervical flexion to rinse. Pt used blow dryer to dry her hair with min A for internal rotation and general proprioception. Pt completed 100 ft of functional mobility to the therapy gym with supervision. Pt completed [facility] task seated with improved dexterity/accuracy from yesterday during the same task. Pt reported dizziness, 7/10. BP was assessed- 88/69. Pt returned to her room and BP was assessed again- now 94/70 and pt asymptomatic. Pt was left sitting in the chair with her husband present, chair alarm set.   Therapy Documentation Precautions:  Precautions Precautions: Fall, Cervical Precaution Comments: soft collar Required Braces or Orthoses:  Cervical Brace Cervical Brace: Soft collar, At all times Restrictions Weight Bearing Restrictions: No  Therapy/Group: Individual Therapy  Curtis Sites 03/17/2020, 6:25 AM
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
## 2020-03-17 NOTE — Progress Notes (Signed)
Speech Language Pathology Daily Session Note  Patient Details  Name: Hannah Roy MRN: [id number] Date of Birth: 16-Feb-1941  Today's Date: 03/17/2020 SLP Individual Time: 1400-1455 SLP Individual Time Calculation (min): 55 min  Short Term Goals: Week 1: SLP Short Term Goal 1 (Week 1): STG=LTG due to ELOS  Skilled Therapeutic Interventions: Skilled treatment session focused on cognitive goals. SLP facilitated session by providing supervision level verbal cues for recall of her current medications and their functions. Patient organized a 3X/day pill box and initially required Mod verbal cues for problem solving which faded to supervision by end of task. Patient also participated in another basic money management task. She demonstrated improved processing and problem solving requiring overall Min A verbal cues. Patient transferred back to bed and left with alarm on and all needs within reach. Continue with current plan of care.      Pain No/Denies Pain   Therapy/Group: Individual Therapy  Arrington Bencomo 03/17/2020, 2:58 PM

## 2020-03-17 NOTE — Progress Notes (Signed)
Physical Therapy Session Note  Patient Details  Name: Hannah Roy MRN: [id number] Date of Birth: 1940/06/01  Today's Date: 03/17/2020 PT Individual Time: 1105-1205 PT Individual Time Calculation (min): 60 min   Short Term Goals: Week 1:  PT Short Term Goal 1 (Week 1): Pt will perform least restrictive transfer with CGA consistently PT Short Term Goal 2 (Week 1): Pt will ambulate x 150 ft with LRAD and CGA consistently PT Short Term Goal 3 (Week 1): Pt will navigate 8 steps with one handrail and min A  Skilled Therapeutic Interventions/Progress Updates:     Patient in chair with her husband in the room upon PT arrival. Patient alert and agreeable to PT session. Patient denied pain during session. Discussed home set up and d/c planning with patient and her husband. Confirmed that patient will have 24/7 supervision from her husband and daughter, has a level entry into the home and chair lift to second level if needed. Reviewed patient's progress and deficits and provided education on follow-up therapies at d/c.   Noted kinesiotape was coming off of R knee and posterior calf. Patient reports that is started to come off in the shower. Removed tape, no signs of skin irritation and improved coloring and swelling form yesterday where tape was applied.   Applied Kinesiotape over R knee and posterior calf using lymphatic drainage technique with the aim of reduced bruising and swelling. Reeducated patient on purpose of kinesiotape placement and signs symptoms of allergic reaction or irritation. Cleaned patient's skin prior to application. Instructed patient that the tape can be left on up to 3 days and can be worn in the shower. Instructed to removed the tape if peeling off, signs of skin irritation arise, or it has been on for >3 days. Informed patient that the tape is best removed in the shower or with a wet wash cloth. Patient stated understanding. 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  Therapeutic Activity: Transfers: Patient performed sit to/from stand x6 with supervision for safety. Provided verbal cues for waiting for therapist x1 due to mild impulsivity.  Gait Training:  Patient ambulated ~100 feet x2 without an AD with CGA progressing to close supervision for safety/balance. Ambulated with decreased gait speed, decreased step length and height, narrow BOS, and patient self-correct decreased R arm swing without cues demonstrating carryover from previous sessions. Provided verbal cues for increased gait speed, BOS, and step height for improved balance with gait.  Neuromuscular Re-ed: Patient performed the following dynamic gait, standing balance, and R upper extremity motor control activities: -side stepping 20 feet R/L x2 with close supervision for safety, provided cues for leading with her heel to activate lateral hip musculature -backwards walking 20 feet x4 with close supervision for safety, provided cues for increased step length for increased gluteal and hamstring activation -ambulated >100 feet holding a basket upside down with a water cup >3/4 full on top x2, focused on dual task challenge and provided cues for attention to surroundings while maintaining the water cup level throughout, patient did not spill any water during task -donned gloved with min A for R hand, cleaned water cup and basket with purple wipes using R hand for cleaning objects for improved dextarity with a functional task -placed objects from a high shelf into a basket using her R hand, then placed the basket on the high shelf using both hands to simulate a functional task with  increased motor control with R upper extremtiy  Patient in recliner with her husband in the room at end of session with breaks locked, chair alarm set, and all needs within reach.    Therapy Documentation Precautions:   Precautions Precautions: Fall, Cervical Precaution Comments: soft collar Required Braces or Orthoses: Cervical Brace Cervical Brace: Soft collar, At all times Restrictions Weight Bearing Restrictions: No General:   Vital Signs: Therapy Vitals Temp: 98.7 F (37.1 C) Temp Source: Oral Pulse Rate: 99 Resp: 18 BP: 92/62 Patient Position (if appropriate): Lying Oxygen Therapy SpO2: 100 % O2 Device: Room Air Pain:   Mobility:   Locomotion :    Trunk/Postural Assessment :    Balance:   Exercises:   Other Treatments:      Therapy/Group: Individual Therapy  Hannah Roy L Hannah Roy PT, DPT  03/17/2020, 4:16 PM

## 2020-03-17 NOTE — Progress Notes (Addendum)
Patient ID: Hannah Roy, female   DOB: October 08, 1940, 78 y.o.   MRN: [id number]  SW ordered DME: 3in1 BSC with [employer] via [facility].   SW faxed outpatient PT/OT/SLP order to [facility] ([phone]/[phone]).   Loralee Pacas, MSW, [place] Office: [phone] Cell: [phone] Fax: [phone]

## 2020-03-18 ENCOUNTER — Encounter (HOSPITAL_COMMUNITY): Payer: Medicare Other | Admitting: Speech Pathology

## 2020-03-18 ENCOUNTER — Encounter (HOSPITAL_COMMUNITY): Payer: Medicare Other

## 2020-03-18 ENCOUNTER — Ambulatory Visit (HOSPITAL_COMMUNITY): Payer: Medicare Other

## 2020-03-18 ENCOUNTER — Inpatient Hospital Stay (HOSPITAL_COMMUNITY): Payer: Medicare Other

## 2020-03-18 DIAGNOSIS — I4892 Unspecified atrial flutter: Secondary | ICD-10-CM | POA: Diagnosis not present

## 2020-03-18 MED ORDER — SENNOSIDES-DOCUSATE SODIUM 8.6-50 MG PO TABS: 2.0000 | ORAL_TABLET | Freq: Every day | ORAL | Status: DC

## 2020-03-18 MED ORDER — ACETAMINOPHEN 325 MG PO TABS
325.0000 mg | ORAL_TABLET | ORAL | Status: AC | PRN
Start: 2020-03-18 — End: ?

## 2020-03-18 NOTE — Progress Notes (Signed)
Occupational Therapy Session Note  Patient Details  Name: Hannah Roy MRN: [id number] Date of Birth: 13-Aug-1940  Today's Date: 03/18/2020 OT Individual Time: 3403-7096 OT Individual Time Calculation (min): 45 min    Short Term Goals: Week 1:  OT Short Term Goal 1 (Week 1): Pt will don pants with CGA OT Short Term Goal 2 (Week 1): Pt will require no more than min cueing to identify self care items on the R side OT Short Term Goal 3 (Week 1): Pt will don shirt with CGA OT Short Term Goal 4 (Week 1): Pt will correctly use self care items with no cueing  Skilled Therapeutic Interventions/Progress Updates:    1:1. Pt received in bed agreeable to OT with daughter present for family education. Pt completes all ambulation with S-CGA from OT initailly transitionning to daughter providing S for mobility with edu re gaurding assist, cervical precatuions, being a "shadow," and cuing for reach back prior to sitting. Pt able to step over edge of tub as pt has grab bars as well as seat for in shower. Pt able to complete recliner transfer as well as bed transfer stepping onto stool to practice simulating high bed at home with daughter providing A. Edu re energy conservation, light/sound sensitivity, overstimulation, limiting visitirs/time and temp regulation as potential issues when going home. Lattie Haw verbalized understanding. Exited session with pt seated in bathroom with NT in room finishing toileting  Therapy Documentation Precautions:  Precautions Precautions: Fall,Cervical Precaution Comments: soft collar Required Braces or Orthoses: Cervical Brace Cervical Brace: Soft collar,At all times Restrictions Weight Bearing Restrictions: No General:   Vital Signs: Therapy Vitals Temp: 98.5 F (36.9 C) Temp Source: Oral Pulse Rate: 91 Resp: 16 BP: 98/71 Patient Position (if appropriate): Lying Oxygen Therapy SpO2: 99 % O2 Device: Room Air Pain:   ADL: ADL Eating: Supervision/safety Where  Assessed-Eating: Chair Grooming: Minimal assistance Where Assessed-Grooming: Sitting at sink Upper Body Bathing: Minimal assistance,Minimal cueing Where Assessed-Upper Body Bathing: Shower Lower Body Bathing: Minimal assistance,Minimal cueing Where Assessed-Lower Body Bathing: Shower Upper Body Dressing: Minimal assistance,Minimal cueing Where Assessed-Upper Body Dressing: Wheelchair Lower Body Dressing: Minimal assistance,Minimal cueing Where Assessed-Lower Body Dressing: Wheelchair Toileting: Minimal assistance,Minimal cueing Where Assessed-Toileting: Glass blower/designer: Minimal Teacher, adult education Method: Counselling psychologist: Energy manager: Minimal assistance,Minimal cueing Social research officer, government Method: Heritage manager: Civil engineer, contracting with back Glass blower/designer   Exercises:   Other Treatments:     Therapy/Group: Individual Therapy  Tonny Branch 03/18/2020, 6:58 AM

## 2020-03-18 NOTE — Progress Notes (Signed)
Occupational Therapy Session Note  Patient Details  Name: Hannah Roy MRN: [id number] Date of Birth: 1940/06/28  Today's Date: 03/18/2020 OT Individual Time: 8299-3716 OT Individual Time Calculation (min): 60 min    Short Term Goals: Week 1:  OT Short Term Goal 1 (Week 1): Pt will don pants with CGA OT Short Term Goal 2 (Week 1): Pt will require no more than min cueing to identify self care items on the R side OT Short Term Goal 3 (Week 1): Pt will don shirt with CGA OT Short Term Goal 4 (Week 1): Pt will correctly use self care items with no cueing  Skilled Therapeutic Interventions/Progress Updates:    1:1. Pt received in bed agreeable to OT. Pt with BP 108/81 and HR 117 at rest. Elected to use w/c d/t elevated HR down to tx gym to begin working on RUE. Pt completes palm<>finger translation of coins and stacking coins for FMC/desterity. Pt completes 9HPT RUE 41 sec LUE 29 sec. Implications explained. Pt completes alternating attention task between peg board and pipe tree with min VC for visual perception with placing PVC in correct direction. But pt self directs alternating with no cues. Pt locates numbers around gym in order backwards 10-1 with mod VC for turning body when visual scanning instead of head. Exited session with pt seated in bed, exit alarm on and call light in reach   Therapy Documentation Precautions:  Precautions Precautions: Fall,Cervical Precaution Comments: soft collar Required Braces or Orthoses: Cervical Brace Cervical Brace: Soft collar,At all times Restrictions Weight Bearing Restrictions: No General:   Vital Signs:   Pain: Pain Assessment Pain Scale: 0-10 Pain Score: 0-No pain ADL: ADL Eating: Supervision/safety Where Assessed-Eating: Chair Grooming: Minimal assistance Where Assessed-Grooming: Sitting at sink Upper Body Bathing: Minimal assistance,Minimal cueing Where Assessed-Upper Body Bathing: Shower Lower Body Bathing: Minimal  assistance,Minimal cueing Where Assessed-Lower Body Bathing: Shower Upper Body Dressing: Minimal assistance,Minimal cueing Where Assessed-Upper Body Dressing: Wheelchair Lower Body Dressing: Minimal assistance,Minimal cueing Where Assessed-Lower Body Dressing: Wheelchair Toileting: Minimal assistance,Minimal cueing Where Assessed-Toileting: Glass blower/designer: Minimal Teacher, adult education Method: Counselling psychologist: Energy manager: Minimal assistance,Minimal cueing Social research officer, government Method: Heritage manager: Civil engineer, contracting with back Glass blower/designer   Exercises:   Other Treatments:     Therapy/Group: Individual Therapy  Tonny Branch 03/18/2020, 12:09 PM

## 2020-03-18 NOTE — Progress Notes (Signed)
Patient ID: Hannah Roy, female   DOB: 1941/01/20, 79 y.o.   MRN: [id number]   SW went by patient room at lunch (12om) to see if daughter Hannah Roy still here to discuss family edu. Reports pt dtr left earlier. SW to call her to see if she has any questions.   SW spoke with pt dtr Hannah Roy [phone]) to follow-up after family edu to see if she had any questions/concerns. States she does not have any concerns, and understood everything shared during family edu. SW reiterated DME ordered 3in1 BSC and outpatient PT/OT/SLP. SW encouraged follow-up if needed.   Loralee Pacas, MSW, [place] Office: [phone] Cell: [phone] Fax: [phone]

## 2020-03-18 NOTE — Progress Notes (Signed)
Speech Language Pathology Daily Session Note  Patient Details  Name: Hannah Roy MRN: [id number] Date of Birth: 1941-02-27  Today's Date: 03/18/2020 SLP Individual Time: 0900-0955 SLP Individual Time Calculation (min): 55 min  Short Term Goals: Week 1: SLP Short Term Goal 1 (Week 1): STG=LTG due to ELOS  Skilled Therapeutic Interventions:   Patient seen with daughter present for skilled ST session focusing on cognitive goals with money and medication management tasks. Patient walked with SLP and daughter to speech therapy room without difficulty and with daughter providing supervision assist only. Patient able to count paper money without significant difficulty but when SLP switched task to her making change, she initially required mod-maxA cues but this faded to minA overall. During pill box setup, patient required mod-max A cues to place pills in correct place for 3 time a week pill box. After placing two simulated medications in pill box, she was able to complete the last medication with supervision to minA. Daughter was very receptive to suggestions for working with patient at home and she demonstrated ability to provide her Mom with adequate cues and assistance but without doing too much. Patient demonstrated very good safety awareness, recall of therapist's names as well as ability to recall medication names and dosage amounts. Patient continues to benefit from skilled SLP intervention to maximize cognitive-linguistic function prior to discharge.  Pain Pain Assessment Pain Scale: 0-10 Pain Score: 0-No pain  Therapy/Group: Individual Therapy  Sonia Baller, MA, CCC-SLP Speech Therapy
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
## 2020-03-18 NOTE — Progress Notes (Addendum)
Physical Therapy Session Note  Patient Details  Name: Hannah Roy MRN: [id number] Date of Birth: 07-Jan-1941  Today's Date: 03/18/2020 PT Individual Time: 1005-1050 PT Individual Time Calculation: 45 min  Short Term Goals: Week 1:  PT Short Term Goal 1 (Week 1): Pt will perform least restrictive transfer with CGA consistently PT Short Term Goal 2 (Week 1): Pt will ambulate x 150 ft with LRAD and CGA consistently PT Short Term Goal 3 (Week 1): Pt will navigate 8 steps with one handrail and min A  Skilled Therapeutic Interventions/Progress Updates:     Patient in recliner with her daughter in the room upon PT arrival. Patient alert and agreeable to PT session. Patient denied pain during session. Patient's daughter participated in hands on training and caregiver education throughout session.  Therapeutic Activity: Transfers: Patient performed sit to/from stand with supervision for safety throughout session. Patient performed a simulated sedan height car transfer with supervision without an AD. Provided cues for safe technique, performed sitting prior to bringing lower extremities into the car without cues, provided positive reinforcement to encourage safe mobility.  Gait Training:  Patient ambulated >100 feet x3 without AD with supervision for safety. Ambulated with decreased trunk rotation, decreased gait speed and step length, and decreased attention on the R, cutting corners close. Provided verbal cues for turning both directions, without cervical rotation, to see where she is going and that the path in clear.  Patient ascended/descended 16 steps using R rail x2 and L rail x2 to simulate home set-up with CGA-close supervision. Performed step-to gait pattern leading with L while ascending and R while descending. Provided cues for technique and sequencing and proper guarding technique. Reviewed having 2 people for safety due to >15 steps initially, must have someone on the downhill side at all  times for safety. Also, reviewed controlled assist to sitting in the event of LOB on the chair, patient's daughter demonstrated with PT standing in as the patient.   Patient ambulated up/down a ramp, over 10 feet of mulch (unlevel surface), and up/down a curb to simulate community ambulation over unlevel surfaces with CGA without AD. Provided cues for technique and use of AD.  Patient's daughter demonstrated safe guarding technique for all mobility, following PTs cues or demonstration. Educated on 24/7 supervision for safety due to cognitive deficits and increased fall risk. Patient's daughter reports that this will be provided at d/c. Provided TBI education on [place] levels, management of overstimulation and activity tolerance, use of a quiet room for patient to take rest breaks, emotional lability, memory deficits that may lead to poor safety awareness with mobility, and information on the [employer] [place] and support groups.   Educated patient and her daughter on fall risk/prevention, home modifications to prevent falls, and activation of emergency services in the event of a fall during session.   Patient and her daughter attentive and appreciative throughout education. Patient with questions regarding medications, differed to nursing and medical team, RN made aware.    Patient in recliner with her daughter in the room at end of session with breaks locked, chair alarm set, and all needs within reach.    Therapy Documentation Precautions:  Precautions Precautions: Fall,Cervical Precaution Comments: soft collar Required Braces or Orthoses: Cervical Brace Cervical Brace: Soft collar,At all times Restrictions Weight Bearing Restrictions: No   Therapy/Group: Individual Therapy  Dereonna Lensing L Jermell Holeman PT, DPT  03/18/2020, 4:38 PM

## 2020-03-18 NOTE — Progress Notes (Signed)
PTs BP 78/54, on call provider Reesa Chew notified. No new orders. Charge nurse made aware. Call bell within reach.

## 2020-03-19 ENCOUNTER — Inpatient Hospital Stay (HOSPITAL_COMMUNITY): Payer: Medicare Other | Admitting: Speech Pathology

## 2020-03-19 ENCOUNTER — Inpatient Hospital Stay (HOSPITAL_COMMUNITY): Payer: Medicare Other

## 2020-03-19 ENCOUNTER — Other Ambulatory Visit (HOSPITAL_COMMUNITY): Payer: Medicare Other

## 2020-03-19 ENCOUNTER — Encounter (HOSPITAL_COMMUNITY): Payer: Medicare Other | Admitting: Nurse Practitioner

## 2020-03-19 MED ORDER — METOPROLOL TARTRATE 50 MG PO TABS
75.0000 mg | ORAL_TABLET | Freq: Two times a day (BID) | ORAL | Status: DC
  Administered 2020-03-19 – 2020-03-22 (×6): 75 mg via ORAL
  Filled 2020-03-19 (×6): qty 1

## 2020-03-19 NOTE — Progress Notes (Signed)
Physical Therapy Session Note  Patient Details  Name: Hannah Roy MRN: [id number] Date of Birth: [birth year]  Today's Date: 03/19/2020 PT Individual Time: 1300-1400 PT Individual Time Calculation (min): 60 min   Short Term Goals: Week 1:  PT Short Term Goal 1 (Week 1): Pt will perform least restrictive transfer with CGA consistently PT Short Term Goal 2 (Week 1): Pt will ambulate x 150 ft with LRAD and CGA consistently PT Short Term Goal 3 (Week 1): Pt will navigate 8 steps with one handrail and min A Week 2:     Skilled Therapeutic Interventions/Progress Updates:    PAIN denies pain this pm   Pt seen this pm for session w/focus on functional gait requiring attention to R environmant, dynamic balance, cognition.  Functional gait:  Pt performed 68ft course including turning 180 at halfway mark, standing on foam eyes open/closed,  picking up cones off of floor.  Pt moves slowly and cautiously w/retrieving objects from floor, no balance loss.  Pt does demonstrate significantly increased overall sway w/eyes closed on foam, min assist for safety. Pt did not demonstrate R inattention w/this task today.  pathfinding - pt directed by therapist to [place] on first floor and instructed to attend to directions w/goal of leading therapist back to gym.  Pt was able to perform pathfinding task w/minimal difficulty but did have much difficulty w/locating call buttons for elevator and determining direction to choose.  Also decreased coordination vs perception noted w/attempting to press buttons.   Gait - worked on gait w/speed changes/stopping/starting all performed w/supervision and no balance loss.  Cognition: Pt attempted to assemble 24pc floor puzzle.  Task required max assist.  Pt unable to identify or utilize a strategy for task.  Pt unable to recognize like patterns unless direct cues given and choices limited to <3 pieces.  Overall max assist to complete.  Pt repeated mult times during  session that her "head feels empty but she answers questions automatically" Pt educated re:  Brain fog, extended recovery process from TB. At end of session pt stated her brain was tired and transferred to bed w/supervision.    Pt left supine w/rails up x 3, alarm set, bed in lowest position, and needs in reach.   Therapy Documentation Precautions:  Precautions Precautions: Fall,Cervical Precaution Comments: soft collar Required Braces or Orthoses: Cervical Brace Cervical Brace: Soft collar,At all times Restrictions Weight Bearing Restrictions: No    Therapy/Group: Individual Therapy  Callie Fielding, PT   Hannah Roy 03/19/2020, 4:02 PM
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
## 2020-03-19 NOTE — Progress Notes (Signed)
Speech Language Pathology Daily Session Note  Patient Details  Name: Hannah Roy MRN: [id number] Date of Birth: [birth year]  Today's Date: 03/19/2020 SLP Individual Time: 9584-4171 SLP Individual Time Calculation (min): 55 min  Short Term Goals: Week 1: SLP Short Term Goal 1 (Week 1): STG=LTG due to ELOS  Skilled Therapeutic Interventions: Skilled treatment session focused on cognitive goals. SLP facilitated session by providing extra time and overall Mod A verbal and visual cues for problem solving during a complex organization/scheudling task. Patient wrote information down while utilizing her RUE with 100% legibility. Patient left upright in the recliner with alarm on and all needs within reach. Continue with current plan of care.      Pain Pain Assessment Pain Scale: 0-10 Pain Score: 0-No pain  Therapy/Group: Individual Therapy  Hutton Pellicane 03/19/2020, 12:43 PM

## 2020-03-19 NOTE — Progress Notes (Signed)
Occupational Therapy Session Note  Patient Details  Name: Hannah Roy MRN: [id number] Date of Birth: [birth year]  Today's Date: 03/19/2020 OT Individual Time: 7793-9030 OT Individual Time Calculation (min): 75 min    Short Term Goals: Week 1:  OT Short Term Goal 1 (Week 1): Pt will don pants with CGA OT Short Term Goal 2 (Week 1): Pt will require no more than min cueing to identify self care items on the R side OT Short Term Goal 3 (Week 1): Pt will don shirt with CGA OT Short Term Goal 4 (Week 1): Pt will correctly use self care items with no cueing  Skilled Therapeutic Interventions/Progress Updates:     Pt received in bed agreeable to shower this date. No pain reported. Functional mobility into/out of bathroom in ADL apartment to shower in tub to simulate home environment. MD discussed with pt in hallway soft collar is considered for comfort now. OT educates pt and husband with these considerations she should still turning body/not head during visual scanning/conversation, use a cup for oral care to decrease cervical flexion and if pt has soreness/pain she should wear the collar. They verbalize understanding  ADL:  Pt completes bathing with supervision mostly standing for hair washing/UB and LB bathing but requires cues to sit for washing feet. Pt safe in standing with eyes closed during rinsing hair/face with no increase in postural sway. Pt  Pt completes UB dressing with with Supervision in standing Pt completes LB dressing with supervision sit to stand from TTB Pt completes footwear with set up in seated Pt completes toileting with supervision for bowel and bladder Pt completes toileting transfer with supervision/set up Pt completes shower/Tub transfer with supervision using grab bars to negotiate step over tub ledge   Therapeutic activity Pt stands on compliant wedge for balance challenge with unilateral fading to no support on tabletop with S while manipulating push pins to  arrange in christmas tree design for RUE [facility] No LOB noted  Pt left at end of session in EOB with exit alarm on, call light in reach and all needs met   Therapy Documentation Precautions:  Precautions Precautions: Fall,Cervical Precaution Comments: soft collar Required Braces or Orthoses: Cervical Brace Cervical Brace: Soft collar,At all times Restrictions Weight Bearing Restrictions: No General:   Vital Signs: Therapy Vitals Temp: 98.1 F (36.7 C) Temp Source: Oral Pulse Rate: (!) 110 Resp: 16 BP: 104/84 Patient Position (if appropriate): Lying Oxygen Therapy SpO2: 99 % O2 Device: Room Air Pain:   ADL: ADL Eating: Supervision/safety Where Assessed-Eating: Chair Grooming: Minimal assistance Where Assessed-Grooming: Sitting at sink Upper Body Bathing: Minimal assistance,Minimal cueing Where Assessed-Upper Body Bathing: Shower Lower Body Bathing: Minimal assistance,Minimal cueing Where Assessed-Lower Body Bathing: Shower Upper Body Dressing: Minimal assistance,Minimal cueing Where Assessed-Upper Body Dressing: Wheelchair Lower Body Dressing: Minimal assistance,Minimal cueing Where Assessed-Lower Body Dressing: Wheelchair Toileting: Minimal assistance,Minimal cueing Where Assessed-Toileting: Glass blower/designer: Minimal Teacher, adult education Method: Counselling psychologist: Energy manager: Minimal assistance,Minimal cueing Social research officer, government Method: Heritage manager: Civil engineer, contracting with back Glass blower/designer   Exercises:   Other Treatments:     Therapy/Group: Individual Therapy  Tonny Branch 03/19/2020, 6:49 AM

## 2020-03-20 ENCOUNTER — Inpatient Hospital Stay (HOSPITAL_COMMUNITY): Payer: Medicare Other

## 2020-03-20 ENCOUNTER — Inpatient Hospital Stay (HOSPITAL_COMMUNITY): Payer: Medicare Other | Admitting: Physical Therapy
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
## 2020-03-20 NOTE — Progress Notes (Signed)
Physical Therapy Session Note  Patient Details  Name: Hannah Roy MRN: [id number] Date of Birth: 1940-12-04  Today's Date: 03/20/2020 PT Individual Time: 1055-1209 PT Individual Time Calculation (min): 74 min   Short Term Goals: Week 1:  PT Short Term Goal 1 (Week 1): Pt will perform least restrictive transfer with CGA consistently PT Short Term Goal 2 (Week 1): Pt will ambulate x 150 ft with LRAD and CGA consistently PT Short Term Goal 3 (Week 1): Pt will navigate 8 steps with one handrail and min A  Skilled Therapeutic Interventions/Progress Updates:    Pt received supine in bed and agreeable to therapy session. Supine>sitting R EOB independently. Pt requesting to don soft cervical collar for comfort, provided assist. Pt performed all functional sit<>stand and stand pivot transfers, no AD, with supervision for safety during session but no unsteadiness noted. Gait training ~152ft to main therapy gym, no AD, with CGA and pt noted to catch toes ~3times during swing but able to recover without increased assist - otherwise demos decreased trunk rotation.  Performed the following dynamic standing balance and dual-cognitive tasks:  - 4 square stepping over hockey sticks while counting backwards from 100 (initially asked pt to count by 2s but she started counting down by 1) - CGA for steadying, most imbalanced when stepping backwards  - 4 corner tapping to external targets on verbal commands with therapist varying order of cuing and type of cuing (number vs color) of target - had pt turn 90degrees to recall location of targets without looking but immediately pt went to look down despite cuing  Pt reports one of her concerns on discharge is stair navigation. Ascended/descended 4 steps (6" height) in therapy gym using only R HR per home set-up with pt performing step-to pattern without cuing - CGA for safety. Gait training ~125ft x2 to/from stairwell, no AD, with continued CGA and again ~3x slightly  catching toes during swing but able to recover without increased assist. Ascended/descended 11 steps in stairwell using R HR only with pt changing to reciprocal pattern on ascent and maintaining step-to pattern on descent with CGA for safety but no unsteadiness noted. Pt participated in 5xSTS demonstrating greater than MCID level improvement as described below. Patient also participated in [employer] and demonstrates significant improvement scoring 52/56 and low fall risk compared to 21/56 6 days ago. (<36= high risk for falls, close to 100%; 37-45 significant >80%; 46-51 moderate >50%; 52-55 lower >25%) Gait training ~154ft to ortho gym, no AD, with progression to supervision. Performed dynamic standing balance, visual scanning, and dual-cognitive tasks using BITS system on complex array/sequence setting: had pt select numbers in ascending order from 1-36 requiring 36min39seconds then in descending order requiring 14min56seconds. Gait training ~533ft for longer distance ambulation with supervision and 1 instance of pt catching her toes but able to maintain balance with only brief, slight unsteadiness. At end of session pt left sitting in recliner with needs in reach and chair alarm on.  Five times Sit to Stand Test (FTSS) Method: Use a straight back chair with a solid seat that is 16-18 high. Ask participant to sit on the chair with arms folded across their chest.   Instructions: Stand up and sit down as quickly as possible 5 times, keeping your arms folded across your chest.   Measurement:  Stop timing when the participant stands the 5th time.  TIME: 1st trial: 8.51seconds, 2nd trial: 7.08 seconds  Times > 13.6 seconds is associated with increased disability and  morbidity (Guralnik, 2000) Times > 15 seconds is predictive of recurrent falls in healthy individuals aged 37 and older (Buatois, et al., 2008) Normal performance values in community dwelling individuals aged 71 and older (Bohannon,  2006): ? 60-69 years: 11.4 seconds ? 70-79 years: 12.6 seconds ? 80-89 years: 14.8 seconds  MCID: ? 2.3 seconds for Vestibular Disorders Mariah Milling, 2006)  Therapy Documentation Precautions:  Precautions Precautions: Fall,Cervical Precaution Comments: soft collar Required Braces or Orthoses: Cervical Brace Cervical Brace: Soft collar,At all times Restrictions Weight Bearing Restrictions: No  Vital Signs: HR monitored throughout session and noted to be 84bpm at rest in beginning elevating up to 118bpm with activity.  Pain:   Denies pain during session.  Balance: Standardized Balance Assessment Standardized Balance Assessment: Berg Balance Test Berg Balance Test Sit to Stand: Able to stand without using hands and stabilize independently Standing Unsupported: Able to stand safely 2 minutes Sitting with Back Unsupported but Feet Supported on Floor or Stool: Able to sit safely and securely 2 minutes Stand to Sit: Sits safely with minimal use of hands Transfers: Able to transfer safely, minor use of hands (due to height has to push up on backs of legs to get hips back onto the seat) Standing Unsupported with Eyes Closed: Able to stand 10 seconds with supervision (minor sway noted) Standing Ubsupported with Feet Together: Able to place feet together independently and stand 1 minute safely From Standing, Reach Forward with Outstretched Arm: Can reach forward >12 cm safely (5") From Standing Position, Pick up Object from Floor: Able to pick up shoe, needs supervision From Standing Position, Turn to Look Behind Over each Shoulder: Looks behind from both sides and weight shifts well Turn 360 Degrees: Able to turn 360 degrees safely in 4 seconds or less Standing Unsupported, Alternately Place Feet on Step/Stool: Able to stand independently and safely and complete 8 steps in 20 seconds Standing Unsupported, One Foot in Front: Able to place foot tandem independently and hold 30  seconds Standing on One Leg: Able to lift leg independently and hold 5-10 seconds Total Score: 52 Functional Gait  Assessment Gait Level Surface:  (intermittently catches LE during gait)   Therapy/Group: Individual Therapy  Tawana Scale , PT, DPT, CSRS  03/20/2020, 1:01 PM

## 2020-03-20 NOTE — Progress Notes (Signed)
Occupational Therapy Session Note  Patient Details  Name: Hannah Roy MRN: [id number] Date of Birth: [birth year]  Today's Date: 03/20/2020 OT Individual Time: 1300-1400 OT Individual Time Calculation (min): 60 min    Short Term Goals: Week 1:  OT Short Term Goal 1 (Week 1): Pt will don pants with CGA OT Short Term Goal 2 (Week 1): Pt will require no more than min cueing to identify self care items on the R side OT Short Term Goal 3 (Week 1): Pt will don shirt with CGA OT Short Term Goal 4 (Week 1): Pt will correctly use self care items with no cueing  Skilled Therapeutic Interventions/Progress Updates:    1;1. Pt received in bed agreeable to OT. Scavenger hunt throughout hospital to locate cafeteria, [place] and gift shop completed with [place] for recall of locations, usage of external aides to locate places and recall what floor pt is currently on. Pt often lost in conversation and walks past elevators and education on dual task processing and decreasing distraction during cognitive task provided. Pt completes BITS activities- puzzle & memory up to 5. Pt unable to follow chart despite max cues to locate letters to spell out work. Pt able to recall words in order up to 4, but pt consistently with difficulty recalling 5 items. Pt given writing homework- lined paper and pen provided to practice writing, "big" to assist with legibility. Exited session with pt seated in bed, exit alarm on and call light in reach   Therapy Documentation Precautions:  Precautions Precautions: Fall,Cervical Precaution Comments: soft collar Required Braces or Orthoses: Cervical Brace Cervical Brace: Soft collar,At all times Restrictions Weight Bearing Restrictions: No General:   Vital Signs:   Pain:   ADL: ADL Eating: Supervision/safety Where Assessed-Eating: Chair Grooming: Minimal assistance Where Assessed-Grooming: Sitting at sink Upper Body Bathing: Minimal assistance,Minimal cueing Where  Assessed-Upper Body Bathing: Shower Lower Body Bathing: Minimal assistance,Minimal cueing Where Assessed-Lower Body Bathing: Shower Upper Body Dressing: Minimal assistance,Minimal cueing Where Assessed-Upper Body Dressing: Wheelchair Lower Body Dressing: Minimal assistance,Minimal cueing Where Assessed-Lower Body Dressing: Wheelchair Toileting: Minimal assistance,Minimal cueing Where Assessed-Toileting: Glass blower/designer: Minimal Teacher, adult education Method: Counselling psychologist: Energy manager: Minimal assistance,Minimal cueing Social research officer, government Method: Heritage manager: Civil engineer, contracting with back Glass blower/designer   Exercises:   Other Treatments:     Therapy/Group: Individual Therapy  Tonny Branch 03/20/2020, 12:50 PM
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
## 2020-03-22 ENCOUNTER — Inpatient Hospital Stay (HOSPITAL_COMMUNITY): Payer: Medicare Other | Admitting: Occupational Therapy

## 2020-03-22 ENCOUNTER — Inpatient Hospital Stay (HOSPITAL_COMMUNITY): Payer: Medicare Other

## 2020-03-22 ENCOUNTER — Inpatient Hospital Stay (HOSPITAL_COMMUNITY): Payer: Medicare Other | Admitting: Speech Pathology

## 2020-03-22 MED ORDER — ASCORBIC ACID 500 MG PO TABS: 500.0000 mg | ORAL_TABLET | Freq: Two times a day (BID) | ORAL | Status: DC

## 2020-03-22 MED ORDER — METOPROLOL TARTRATE 50 MG PO TABS
100.0000 mg | ORAL_TABLET | Freq: Two times a day (BID) | ORAL | Status: DC
  Administered 2020-03-22 – 2020-03-23 (×2): 100 mg via ORAL
  Filled 2020-03-22 (×2): qty 2

## 2020-03-22 NOTE — Progress Notes (Signed)
Speech Language Pathology Discharge Summary  Patient Details  Name: Hannah Roy MRN: [id number] Date of Birth: 04/30/1940  Today's Date: 03/22/2020 SLP Individual Time: 1410-1505 SLP Individual Time Calculation (min): 55 min   Skilled Therapeutic Interventions:  Skilled treatment session focused on cognitive goals. SLP facilitated session by by re-administering the [employer] Mental Status Examination (SLUMS). Ppatient scored  23/30 points with a score of 27 or above considered normal which is a 10 point improvement since initial administration on 03/15/20.  Patient continues to demonstrate deficits in short-term recall and problem solving. SLP also facilitated session by providing education regarding patient's current cognitive functioning, memory compensatory strategies and a list of activities patient can participate in safely at home. Handouts were also given to reinforce information. Patient verbalized understanding and agreement. Patient left upright in the recliner with alarm on and all needs within reach. Continue with current plan of care.    Patient has met 6 of 6 long term goals.  Patient to discharge at overall Min level.   Reasons goals not met: N/A   Clinical Impression/Discharge Summary: Patient has made excellent gains and has met 6 of 6 LTGs this admission. Currently, patient is demonstrating behaviors consistent with a [place] Level VIII and requires overall Min A verbal cues to complete functional and familiar tasks safely in regards to problem solving, attention, recall, and awareness. Patient and family education is complete and patient will discharge home with assistance from family. Patient would benefit from f/u SLP services to maximize her cognitive functioning and overall functional independence in order to reduce caregiver burden.   Care Partner:  Caregiver Able to Provide Assistance: Yes  Type of Caregiver Assistance: Physical;Cognitive  Recommendation:   24 hour supervision/assistance;Outpatient SLP  Rationale for SLP Follow Up: Reduce caregiver burden;Maximize cognitive function and independence   Equipment: N/A   Reasons for discharge: Discharged from hospital;Treatment goals met   Patient/Family Agrees with Progress Made and Goals Achieved: Yes    PAYNE, COURTNEY 03/22/2020, 6:40 AM    

## 2020-03-22 NOTE — Progress Notes (Signed)
Occupational Therapy Session Note  Patient Details  Name: Hannah Roy MRN: [id number] Date of Birth: 1940-05-28  Today's Date: 03/22/2020 OT Individual Time: 2878-6767 OT Individual Time Calculation (min): 30 min    Short Term Goals: Week 1:  OT Short Term Goal 1 (Week 1): Pt will don pants with CGA OT Short Term Goal 2 (Week 1): Pt will require no more than min cueing to identify self care items on the R side OT Short Term Goal 3 (Week 1): Pt will don shirt with CGA OT Short Term Goal 4 (Week 1): Pt will correctly use self care items with no cueing   Skilled Therapeutic Interventions/Progress Updates:    Pt greeted at time of session sitting up in recliner agreeable to OT, pleasant and cooperative throughout session but did need frequent cues throughout for problem solving and short term memory based situations. No pain. Walked room <> gym and around day room with Supervision for safety no AD. Performed BITS activities in standing for 3 rounds, focused on short term memory with strategies to promote recall as well as visual scanning, pt able to perform in dynamic standing with supervision. After walking ortho gym > dayroom pt with mod difficulty locating her room but did remember room #. Set up in recliner with alarm on, call bell in reach. Pt had no questions regarding DC home and did remember family had been in for training already.   Therapy Documentation Precautions:  Precautions Precautions: Cervical Precaution Comments: soft collar Required Braces or Orthoses: Cervical Brace Cervical Brace: Soft collar Restrictions Weight Bearing Restrictions: No     Therapy/Group: Individual Therapy  Viona Gilmore 03/22/2020, 4:54 PM

## 2020-03-22 NOTE — Progress Notes (Signed)
Occupational Therapy Discharge Summary  Patient Details  Name: Hannah Roy MRN: [id number] Date of Birth: [birth year]  Today's Date: 03/22/2020 OT Individual Time: 1105-1200 OT Individual Time Calculation (min): 55 min    Patient has met 10 of 10 long term goals due to improved activity tolerance, improved balance, postural control, ability to compensate for deficits, functional use of  RIGHT upper and RIGHT lower extremity, improved attention, improved awareness and improved coordination.  Patient to discharge at overall Supervision level.  Patient's care partner is independent to provide the necessary cognitive assistance at discharge.    Reasons goals not met: All treatment goals met.   Recommendation:  Patient will benefit from ongoing skilled OT services in outpatient setting to continue to advance functional skills in the area of BADL and iADL.  Equipment: BSC  Reasons for discharge: treatment goals met and discharge from hospital  Patient/family agrees with progress made and goals achieved: Yes   Skilled OT Intervention:  Pt received sitting in the recliner with no c/o pain. Pt requesting to shower. Pt completed functional mobility around room and gathered clothes/supplies appropriately with mod I overall. Pt completed shower sit <> stand with use of shower chair with supervision. No concerns for safety. Pt completed transfer back to recliner where she dressed with mod I overall. Standing level grooming at sink with great inclusion of RUE. Pt completed BITS system with fantastic improvement in sequencing and visual scanning, improving from 33% to 93%. With 2 step memory sequence activity, she improved from 81 % to 100%. Pt completed 500+ of functional mobility with supervision. Pt was left sitting up in the recliner with all needs met. Chair alarm set.   OT Discharge Precautions/Restrictions  Precautions Precautions: Cervical Precaution Comments: soft collar Required Braces or  Orthoses: Cervical Brace Cervical Brace: Soft collar Restrictions Weight Bearing Restrictions: No Pain Pain Assessment Pain Scale: 0-10 Pain Score: 0-No pain ADL ADL Eating: Modified independent Where Assessed-Eating: Chair Grooming: Supervision/safety Where Assessed-Grooming: Standing at sink Upper Body Bathing: Supervision/safety Where Assessed-Upper Body Bathing: Shower Lower Body Bathing: Modified independent Where Assessed-Lower Body Bathing: Shower Upper Body Dressing: Independent Where Assessed-Upper Body Dressing: Edge of bed Lower Body Dressing: Modified independent Where Assessed-Lower Body Dressing: Edge of bed Toileting: Supervision/safety Where Assessed-Toileting: Glass blower/designer: Distant supervision Armed forces technical officer Method: Counselling psychologist: Energy manager: Distant supervision Social research officer, government Method: Heritage manager: Civil engineer, contracting with back Vision Baseline Vision/History: Wears glasses Wears Glasses: Reading only Patient Visual Report: Blurring of vision Vision Assessment?: Yes Eye Alignment: Within Functional Limits Ocular Range of Motion: Within Functional Limits Alignment/Gaze Preference: Within Defined Limits Tracking/Visual Pursuits: Decreased smoothness of horizontal tracking;Decreased smoothness of eye movement to RIGHT inferior field;Decreased smoothness of eye movement to LEFT inferior field Saccades: Additional eye shifts occurred during testing Convergence: Impaired (comment) (>5 cm) Perception  Perception: Impaired Inattention/Neglect: Does not attend to right visual field (greatly improved R attention) Body Part Identification: WFL now Praxis Praxis: Intact Cognition Overall Cognitive Status: Impaired/Different from baseline Arousal/Alertness: Awake/alert Orientation Level: Oriented X4 Attention: Selective Sustained Attention: Appears intact Selective Attention:  Impaired Selective Attention Impairment: Functional complex;Verbal complex Memory: Impaired Memory Impairment: Decreased recall of new information;Retrieval deficit;Storage deficit Awareness Impairment: Anticipatory impairment Problem Solving: Impaired Problem Solving Impairment: Functional complex Safety/Judgment: Impaired Rancho Los Amigos Scales of Cognitive Functioning: Purposeful/appropriate Sensation Sensation Light Touch: Impaired Detail Light Touch Impaired Details: Impaired RUE (slightly diminished) Hot/Cold: Appears Intact Proprioception: Appears Intact Coordination Gross Motor Movements are  Fluid and Coordinated: No Fine Motor Movements are Fluid and Coordinated: No Coordination and Movement Description: decreased R side attention and hypometria with movements of extremities R>L Finger Nose Finger Test: slow and deliberate R>L Heel Shin Test: slow and deliberate R>L Motor  Motor Motor: Hemiplegia Motor - Discharge Observations: R inattention, R UE>LE Mobility  Bed Mobility Bed Mobility: Rolling Right;Rolling Left;Supine to Sit;Sit to Supine Rolling Right: Independent Rolling Left: Independent Supine to Sit: Independent Sit to Supine: Independent Transfers Sit to Stand: Independent Stand to Sit: Independent  Trunk/Postural Assessment  Cervical Assessment Cervical Assessment: Exceptions to [facility] (soft collar OOB, cervical precautoins) Thoracic Assessment Thoracic Assessment: Within Functional Limits Lumbar Assessment Lumbar Assessment: Within Functional Limits Postural Control Postural Control: Deficits on evaluation (delayed)  Balance Balance Balance Assessed: Yes Standardized Balance Assessment Standardized Balance Assessment: Berg Balance Test;Functional Gait Assessment Berg Balance Test Sit to Stand: Able to stand without using hands and stabilize independently Standing Unsupported: Able to stand safely 2 minutes Sitting with Back Unsupported but Feet  Supported on Floor or Stool: Able to sit safely and securely 2 minutes Stand to Sit: Sits safely with minimal use of hands Transfers: Able to transfer safely, minor use of hands (due to height has to push up on backs of legs to get hips back onto the seat) Standing Unsupported with Eyes Closed: Able to stand 10 seconds with supervision (minor sway noted) Standing Ubsupported with Feet Together: Able to place feet together independently and stand 1 minute safely From Standing, Reach Forward with Outstretched Arm: Can reach forward >12 cm safely (5") From Standing Position, Pick up Object from Floor: Able to pick up shoe, needs supervision From Standing Position, Turn to Look Behind Over each Shoulder: Looks behind from both sides and weight shifts well Turn 360 Degrees: Able to turn 360 degrees safely in 4 seconds or less Standing Unsupported, Alternately Place Feet on Step/Stool: Able to stand independently and safely and complete 8 steps in 20 seconds Standing Unsupported, One Foot in Front: Able to place foot tandem independently and hold 30 seconds Standing on One Leg: Able to lift leg independently and hold 5-10 seconds Total Score: 52 Static Sitting Balance Static Sitting - Balance Support: Feet supported Static Sitting - Level of Assistance: 7: Independent Dynamic Sitting Balance Dynamic Sitting - Balance Support: During functional activity Dynamic Sitting - Level of Assistance: 7: Independent Static Standing Balance Static Standing - Balance Support: During functional activity;No upper extremity supported Static Standing - Level of Assistance: 7: Independent Dynamic Standing Balance Dynamic Standing - Balance Support: During functional activity;Bilateral upper extremity supported Dynamic Standing - Level of Assistance: 7: Independent Functional Gait  Assessment Gait Level Surface: Walks 20 ft, slow speed, abnormal gait pattern, evidence for imbalance or deviates 10-15 in outside of  the 12 in walkway width. Requires more than 7 sec to ambulate 20 ft. (intermittently catches LE during gait) Change in Gait Speed: Makes only minor adjustments to walking speed, or accomplishes a change in speed with significant gait deviations, deviates 10-15 in outside the 12 in walkway width, or changes speed but loses balance but is able to recover and continue walking. Gait with Horizontal Head Turns: Performs head turns with moderate changes in gait velocity, slows down, deviates 10-15 in outside 12 in walkway width but recovers, can continue to walk. (unable due to cervical precautions) Gait with Vertical Head Turns: Performs task with severe disruption of gait (eg, staggers 15 in outside 12 in walkway width, loses balance, stops, reaches  for wall). (unable due to cervical precautions) Gait and Pivot Turn: Turns slowly, requires verbal cueing, or requires several small steps to catch balance following turn and stop Step Over Obstacle: Is able to step over one shoe box (4.5 in total height) without changing gait speed. No evidence of imbalance. Gait with Narrow Base of Support: Is able to ambulate for 10 steps heel to toe with no staggering. Gait with Eyes Closed: Walks 20 ft, slow speed, abnormal gait pattern, evidence for imbalance, deviates 10-15 in outside 12 in walkway width. Requires more than 9 sec to ambulate 20 ft. Ambulating Backwards: Walks 20 ft, uses assistive device, slower speed, mild gait deviations, deviates 6-10 in outside 12 in walkway width. Steps: Two feet to a stair, must use rail. Total Score: 12 FGA comment:: 12/22 (-8 points from total due to pt unable to perform head turns with cervical precautions) Extremity/Trunk Assessment RUE Assessment RUE Assessment: Exceptions to [facility] General Strength Comments: 4-/5, limited mainly by neglect RUE Body System: Neuro Brunstrum levels for arm and hand: Arm;Hand Brunstrum level for arm: Stage V Relative Independence from  Synergy Brunstrum level for hand: Stage VI Isolated joint movements LUE Assessment LUE Assessment: Within Functional Limits   Curtis Sites 03/22/2020, 11:41 AM
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
## 2020-03-22 NOTE — Progress Notes (Signed)
Physical Therapy Discharge Summary  Patient Details  Name: Hannah Roy MRN: [id number] Date of Birth: Jun 18, 1940  Today's Date: 03/22/2020 PT Individual Time: 0905-1000 PT Individual Time Calculation (min): 55 min    Patient has met 11 of 11 long term goals due to improved activity tolerance, improved balance, improved postural control, increased strength, decreased pain, ability to compensate for deficits, improved attention, improved awareness and improved coordination.  Patient to discharge at an ambulatory level Supervision.   Patient's care partner is independent to provide the necessary cognitive assistance at discharge.  Reasons goals not met: n/a  Recommendation:  Patient will benefit from ongoing skilled PT services in outpatient setting to continue to advance safe functional mobility, address ongoing impairments in balance, dual task training, community integration, cognitive remediation related to functional mobility, activity tolerance, cervical ROMstrengthening when cleared by MD, safety awareness, patient/caregiver education, and minimize fall risk.  Equipment: No equipment provided  Reasons for discharge: treatment goals met  Patient/family agrees with progress made and goals achieved: Yes  Skilled Therapeutic Interventions: Patient in recliner with soft collar doffed in the room upon PT arrival. Patient alert and agreeable to PT session. Patient denied pain during session. Patient donned soft collar for comfort with min cues at beginning of session.   Therapeutic Activity: Bed Mobility: Patient performed rolling R/L and supine to/from sit independently in a flat bed without use of bed rails.  Transfers: Patient performed sit to/from stand independently throughout session from various surfaces.   Gait Training:  Patient ambulated throughout the unit, 100-200 feet x3 without an AD with supervision, intermittently catching her R toe x3 during session, patient able to  self-correct without additional assistance. Ambulated as described below. Provided verbal cues for attending to her R, increased R foot clearance, and increased BOS for safety. Patient ascended/descended 20 steps using R or L rail to simulate home set up with supervision. Performed step-to with intermittent step through gait pattern. Provided cues for technique and sequencing.  Patient ambulated up/down a ramp, over 10 feet of mulch (unlevel surface), and up/down a curb to simulate community ambulation over unlevel surfaces with close supervision without an AD. Provided cues for technique.  Neuromuscular Re-ed: Patient performed the Functional Gait Assessment: Patient demonstrates increased fall risk as noted by score of  12/30 or 12/22 due to limitations to assess walking with head turns due to cervical precautions.  (<19/30=increased fall risk with dynamic gait) Educated patient of results and interpretation of her score on standardized assessments performed today and Saturday.  Therapeutic Exercise: Patient performed the following exercises with verbal and tactile cues for proper technique. 6 Min Walk Test:  Instructed patient to ambulate as quickling and as safely as possible for 6 minutes using LRAD. Patient was allowed to take standing rest breaks without stopping the test, but if he required a sitting rest break the clock would be stopped and the test would be over.  Results: 1000 feet without AD, HR 60bpm RPE 1/10 prior to testing, HR 104 bpm RPE 5/10 end of testing   Reviewed safety with home mobility, fall risk and prevention, and TBI recovery education during session.   Patient in recliner in the room at end of session with breaks locked, chair alarm set, and all needs within reach.    PT Discharge Precautions/Restrictions Precautions Precautions: Cervical Required Braces or Orthoses: Cervical Brace Cervical Brace: Soft collar Restrictions Weight Bearing Restrictions:  No Vision/Perception  Vision - Assessment Eye Alignment: Within Functional Limits Alignment/Gaze Preference: Within  Defined Limits Tracking/Visual Pursuits: Decreased smoothness of horizontal tracking;Decreased smoothness of eye movement to RIGHT inferior field;Decreased smoothness of eye movement to LEFT inferior field Saccades: Additional eye shifts occurred during testing (end range nystagmus L/R) Convergence: Impaired (comment) (>5 cm) Perception Inattention/Neglect: Does not attend to right visual field;Does not attend to right side of body Praxis Praxis: Intact  Cognition Overall Cognitive Status: Impaired/Different from baseline Arousal/Alertness: Awake/alert Orientation Level: Oriented X4 Attention: Selective Sustained Attention: Appears intact Selective Attention: Impaired Selective Attention Impairment: Functional complex;Verbal complex Memory: Impaired Memory Impairment: Decreased recall of new information;Retrieval deficit;Storage deficit Awareness Impairment: Anticipatory impairment Problem Solving: Impaired Problem Solving Impairment: Functional complex Safety/Judgment: Impaired [place] [employer] Scales of Cognitive Functioning: Purposeful/appropriate Sensation Sensation Light Touch: Impaired Detail Light Touch Impaired Details: Impaired RUE (slightly deminished sensation throughout R UE) Hot/Cold: Not tested Proprioception: Appears Intact Coordination Gross Motor Movements are Fluid and Coordinated: No Fine Motor Movements are Fluid and Coordinated: No Coordination and Movement Description: decreased R side attention and hypometria with movements of extremities R>L Finger Nose Finger Test: slow and deliberate R>L Heel Shin Test: slow and deliberate R>L Motor  Motor Motor: Hemiplegia Motor - Discharge Observations: R inattention, R UE>LE  Mobility Bed Mobility Bed Mobility: Rolling Right;Rolling Left;Supine to Sit;Sit to Supine Rolling Right:  Independent Rolling Left: Independent Supine to Sit: Independent Sit to Supine: Independent Transfers Transfers: Sit to Stand;Stand to Sit;Stand Pivot Transfers Sit to Stand: Independent Stand to Sit: Independent Stand Pivot Transfers: Supervision/Verbal cueing Transfer (Assistive device): None Locomotion  Gait Ambulation: Yes Gait Assistance: Supervision/Verbal cueing Gait Distance (Feet): 1000 Feet (during 6 Minute Walk Test (HR 60-104 bpm, RPE 0-6/10)) Gait Assistance Details: Verbal cues for gait pattern;Verbal cues for precautions/safety Gait Gait: Yes Gait Pattern: Decreased stride length;Decreased trunk rotation;Poor foot clearance - right;Narrow base of support Gait velocity: decreased Stairs / Additional Locomotion Stairs: Yes Stairs Assistance: Supervision/Verbal cueing Stair Management Technique: One rail Right Number of Stairs: 20 Height of Stairs: 6 Ramp: Supervision/Verbal cueing Curb: Supervision/Verbal cueing Wheelchair Mobility 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, petroleum)  Trunk/Postural Assessment  Cervical Assessment Cervical Assessment: Exceptions to [facility] (soft collar; cervical precautions) Thoracic Assessment Thoracic Assessment: Within Functional Limits Lumbar Assessment Lumbar Assessment: Within Functional Limits Postural Control Postural Control: Deficits on evaluation (delayed)  Balance Standardized Balance Assessment Standardized Balance Assessment: Berg Balance Test;Functional Gait Assessment Berg Balance Test Sit to Stand: Able to stand without using hands and stabilize independently Standing Unsupported: Able to stand safely 2 minutes Sitting with Back Unsupported but Feet Supported on Floor or Stool: Able to sit safely and securely 2 minutes Stand to Sit: Sits safely with minimal use of hands Transfers: Able to transfer safely, minor use of hands (due to height has to push up on backs of legs to get hips back onto the  seat) Standing Unsupported with Eyes Closed: Able to stand 10 seconds with supervision (minor sway noted) Standing Ubsupported with Feet Together: Able to place feet together independently and stand 1 minute safely From Standing, Reach Forward with Outstretched Arm: Can reach forward >12 cm safely (5") From Standing Position, Pick up Object from Floor: Able to pick up shoe, needs supervision From Standing Position, Turn to Look Behind Over each Shoulder: Looks behind from both sides and weight shifts well Turn 360 Degrees: Able to turn 360 degrees safely in 4 seconds or less Standing Unsupported, Alternately Place Feet on Step/Stool: Able to stand independently and safely and complete 8 steps in 20 seconds Standing Unsupported,  One Foot in Front: Able to place foot tandem independently and hold 30 seconds Standing on One Leg: Able to lift leg independently and hold 5-10 seconds Total Score: 52 Dynamic Sitting Balance Dynamic Sitting - Level of Assistance: 7: Independent Static Standing Balance Static Standing - Level of Assistance: 7: Independent Dynamic Standing Balance Dynamic Standing - Level of Assistance: 7: Independent Functional Gait  Assessment Gait Level Surface: Walks 20 ft, slow speed, abnormal gait pattern, evidence for imbalance or deviates 10-15 in outside of the 12 in walkway width. Requires more than 7 sec to ambulate 20 ft. (intermittently catches LE during gait) Change in Gait Speed: Makes only minor adjustments to walking speed, or accomplishes a change in speed with significant gait deviations, deviates 10-15 in outside the 12 in walkway width, or changes speed but loses balance but is able to recover and continue walking. Gait with Horizontal Head Turns: Performs head turns with moderate changes in gait velocity, slows down, deviates 10-15 in outside 12 in walkway width but recovers, can continue to walk. (unable due to cervical precautions) Gait with Vertical Head Turns:  Performs task with severe disruption of gait (eg, staggers 15 in outside 12 in walkway width, loses balance, stops, reaches for wall). (unable due to cervical precautions) Gait and Pivot Turn: Turns slowly, requires verbal cueing, or requires several small steps to catch balance following turn and stop Step Over Obstacle: Is able to step over one shoe box (4.5 in total height) without changing gait speed. No evidence of imbalance. Gait with Narrow Base of Support: Is able to ambulate for 10 steps heel to toe with no staggering. Gait with Eyes Closed: Walks 20 ft, slow speed, abnormal gait pattern, evidence for imbalance, deviates 10-15 in outside 12 in walkway width. Requires more than 9 sec to ambulate 20 ft. Ambulating Backwards: Walks 20 ft, uses assistive device, slower speed, mild gait deviations, deviates 6-10 in outside 12 in walkway width. Steps: Two feet to a stair, must use rail. Total Score: 12 FGA comment:: 12/22 (-8 points from total due to pt unable to perform head turns with cervical precautions) Extremity Assessment  RLE Assessment RLE Assessment: Within Functional Limits Active Range of Motion (AROM) Comments: Grossly WFL with all mobiliyt, slightly reduced knee flexion due to hematoma General Strength Comments: 5/5 grossly in sitting LLE Assessment LLE Assessment: Within Functional Limits Active Range of Motion (AROM) Comments: Grossly WFL with all mobility General Strength Comments: 5/5 grossly in sitting    Burnell Matlin L Ren [place]asse PT, DPT  03/22/2020, 1:02 PM

## 2020-03-23 ENCOUNTER — Ambulatory Visit (HOSPITAL_COMMUNITY): Payer: Medicare Other | Admitting: Nurse Practitioner

## 2020-03-23 DIAGNOSIS — K5901 Slow transit constipation: Secondary | ICD-10-CM

## 2020-03-23 DIAGNOSIS — D62 Acute posthemorrhagic anemia: Secondary | ICD-10-CM

## 2020-03-23 DIAGNOSIS — G4701 Insomnia due to medical condition: Secondary | ICD-10-CM

## 2020-03-23 DIAGNOSIS — I483 Typical atrial flutter: Secondary | ICD-10-CM

## 2020-03-23 MED ORDER — PANTOPRAZOLE SODIUM 40 MG PO TBEC: 40.0000 mg | DELAYED_RELEASE_TABLET | Freq: Every day | ORAL | 0 refills | Status: DC

## 2020-03-23 MED ORDER — METOPROLOL TARTRATE 100 MG PO TABS: 100.0000 mg | ORAL_TABLET | Freq: Two times a day (BID) | ORAL | 0 refills | Status: DC

## 2020-03-23 MED ORDER — FERROUS SULFATE 325 (65 FE) MG PO TABS: 325.0000 mg | ORAL_TABLET | Freq: Two times a day (BID) | ORAL | 0 refills | Status: DC

## 2020-03-23 MED ORDER — TRAZODONE HCL 50 MG PO TABS: 25.0000 mg | ORAL_TABLET | Freq: Every evening | ORAL | 0 refills | Status: DC | PRN

## 2020-03-23 NOTE — Progress Notes (Signed)
Pt discharge instructions discussed per PA. No other concerns at this time. Last set of vitals obtained. Daughter present. Discharged per wheelchair to private vehicle. No complications noted.  Sheela Stack, LPN

## 2020-03-23 NOTE — Progress Notes (Signed)
Patient is being discharge while I went into room. HR is well controlled.   [facility] will sign off.   Medication Recommendations:  Continue metoprolol 100mg  BID Other recommendations (labs, testing, etc):  Resumption of coumadin as outpatient. See prior notes Follow up as an outpatient:  Follow up in afib clinic 03/30/20 @ 11am

## 2020-03-23 NOTE — Discharge Instructions (Signed)
Inpatient Rehab Discharge Instructions  AURIANA SCALIA Discharge date and time:  03/23/20  Activities/Precautions/ Functional Status: Activity: no lifting, driving, or strenuous exercise till cleared by MD.  Diet: cardiac diet Wound Care: keep wound clean and dry    Functional status:  ___ No restrictions     ___ Walk up steps independently _X__ 24/7 supervision/assistance   ___ Walk up steps with assistance ___ Intermittent supervision/assistance  ___ Bathe/dress independently ___ Walk with walker     ___ Bathe/dress with assistance ___ Walk Independently    ___ Shower independently ___ Walk with assistance    _X__ Shower with assistance _X__ No alcohol     ___ Return to work/school ________   Special Instructions: 1. No aspirin or coumadin till blood resolves. Neurology will do follow up CT head to give you recommendations.  2. No driving till cleared by MD. 3. Recommend wear collar when in car or active.   COMMUNITY REFERRALS UPON DISCHARGE:    Outpatient: PT     OT    ST             Agency: [facility]  Phone: [phone]             Appointment Date/Time:*Please expect follow-up within 7-10 business days to schedule your appointment. If you have not received follow-up, be sure to contact the site directly.*  Medical Equipment/Items Ordered: 3in1 bedside commode                                                 Agency/Supplier: [place] [phone]     My questions have been answered and I understand these instructions. I will adhere to these goals and the provided educational materials after my discharge from the hospital.  Patient/Caregiver Signature _______________________________ Date __________  Clinician Signature _______________________________________ Date __________  Please bring this form and your medication list with you to all your follow-up doctor's appointments.
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
## 2020-03-23 NOTE — Progress Notes (Signed)
Inpatient Rehabilitation Care Coordinator  Discharge Note  The overall goal for the admission was met for:   Discharge location: Yes. D/c to home  Length of Stay: Yes. 9 days.   Discharge activity level: Yes. Supervision  Home/community participation: Yes. Limited.   Services provided included: MD, RD, PT, OT, SLP, RN, CM, TR, Pharmacy, Neuropsych and SW  Financial Services: Medicare  Follow-up services arranged: Outpatient: [facility] for outpatient PT/OT/SLP and DME: [place] for 3in1 [facility]  Comments (or additional information): contact pt dtr Lattie Haw [phone]  Patient/Family verbalized understanding of follow-up arrangements: Yes  Individual responsible for coordination of the follow-up plan: Pt to have assistance with coordinating care  Confirmed correct DME delivered: Rana Snare 03/23/2020    Rana Snare
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
## 2020-03-24 ENCOUNTER — Telehealth: Payer: Self-pay

## 2020-03-24 ENCOUNTER — Encounter: Payer: Self-pay | Admitting: Physician Assistant
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
## 2020-03-24 NOTE — Telephone Encounter (Signed)
1.  Please let the patient know that I did get a report from the hospital that she was being released and I am very glad to see that she is able to come home and we look forward to seeing her #2 lets set her up for Monday or Tuesday or Wednesday in the afternoon 3 or 3:30 PM whichever is open Or Thursday of next week  Also see if there is anything she needs at this present time

## 2020-03-24 NOTE — Telephone Encounter (Signed)
Patient got out of hospital today and needing to make 2 week followup to come in . Please advise where to fit into schedule.

## 2020-03-25 ENCOUNTER — Other Ambulatory Visit: Payer: Self-pay

## 2020-03-25 ENCOUNTER — Other Ambulatory Visit (HOSPITAL_COMMUNITY)
Admission: RE | Admit: 2020-03-25 | Discharge: 2020-03-25 | Disposition: A | Discharge status: Home / Self Care | Payer: Medicare Other | Source: Ambulatory Visit | Attending: Physician Assistant | Admitting: Physician Assistant

## 2020-03-25 ENCOUNTER — Ambulatory Visit (INDEPENDENT_AMBULATORY_CARE_PROVIDER_SITE_OTHER): Payer: Medicare Other | Admitting: Physician Assistant

## 2020-03-25 ENCOUNTER — Encounter: Payer: Self-pay | Admitting: Physician Assistant

## 2020-03-25 ENCOUNTER — Encounter (HOSPITAL_COMMUNITY): Payer: Medicare Other | Admitting: Nurse Practitioner

## 2020-03-25 ENCOUNTER — Telehealth: Payer: Self-pay | Admitting: Physician Assistant

## 2020-03-25 VITALS — BP 128/82 | HR 87 | Ht 61.0 in | Wt 120.4 lb

## 2020-03-25 DIAGNOSIS — I2510 Atherosclerotic heart disease of native coronary artery without angina pectoris: Secondary | ICD-10-CM | POA: Diagnosis not present

## 2020-03-25 DIAGNOSIS — I4892 Unspecified atrial flutter: Secondary | ICD-10-CM | POA: Diagnosis not present

## 2020-03-25 DIAGNOSIS — S065XAA Traumatic subdural hemorrhage with loss of consciousness status unknown, initial encounter: Secondary | ICD-10-CM

## 2020-03-25 DIAGNOSIS — R001 Bradycardia, unspecified: Secondary | ICD-10-CM

## 2020-03-25 DIAGNOSIS — S065X9A Traumatic subdural hemorrhage with loss of consciousness of unspecified duration, initial encounter: Secondary | ICD-10-CM

## 2020-03-25 DIAGNOSIS — D62 Acute posthemorrhagic anemia: Secondary | ICD-10-CM

## 2020-03-25 DIAGNOSIS — I38 Endocarditis, valve unspecified: Secondary | ICD-10-CM

## 2020-03-25 LAB — CBC
HCT: 31.2 % — ABNORMAL LOW (ref 36.0–46.0)
Hemoglobin: 10 g/dL — ABNORMAL LOW (ref 12.0–15.0)
MCH: 32.5 pg (ref 26.0–34.0)
MCHC: 32.1 g/dL (ref 30.0–36.0)
MCV: 101.3 fL — ABNORMAL HIGH (ref 80.0–100.0)
Platelets: 221 10*3/uL (ref 150–400)
RBC: 3.08 MIL/uL — ABNORMAL LOW (ref 3.87–5.11)
RDW: 15.8 % — ABNORMAL HIGH (ref 11.5–15.5)
WBC: 4.2 10*3/uL (ref 4.0–10.5)
nRBC: 0 % (ref 0.0–0.2)

## 2020-03-25 MED ORDER — AMOXICILLIN 500 MG PO TABS
ORAL_TABLET | ORAL | 2 refills | Status: DC
Start: 2020-03-25 — End: 2020-10-13

## 2020-03-25 NOTE — Telephone Encounter (Signed)
Spoke with Wells Guiles in Dr. Sherley Bounds office. Informed of note. Wells Guiles will inform Dr. Ronnald Ramp and have him reply in [place].

## 2020-03-25 NOTE — Patient Instructions (Signed)
Medication Instructions:  Your physician recommends that you continue on your current medications as directed. Please refer to the Current Medication list given to you today.  Take Amoxicillin 4 Tablets 30-60 minutes before dental procedures   *If you need a refill on your cardiac medications before your next appointment, please call your pharmacy*   Lab Work: Your physician recommends that you return for lab work in: Today   If you have labs (blood work) drawn today and your tests are completely normal, you will receive your results only by: [name] MyChart Message (if you have MyChart) OR . A paper copy in the mail If you have any lab test that is abnormal or we need to change your treatment, we will call you to review the results.   Testing/Procedures: NONE   Follow-Up: At [facility], you and your health needs are our priority.  As part of our continuing mission to provide you with exceptional heart care, we have created designated Provider Care Teams.  These Care Teams include your primary Cardiologist (physician) and Advanced Practice Providers (APPs -  Physician Assistants and Nurse Practitioners) who all work together to provide you with the care you need, when you need it.  We recommend signing up for the patient portal called "MyChart".  Sign up information is provided on this After Visit Summary.  MyChart is used to connect with patients for Virtual Visits (Telemedicine).  Patients are able to view lab/test results, encounter notes, upcoming appointments, etc.  Non-urgent messages can be sent to your provider as well.   To learn more about what you can do with MyChart, go to [email or web address].    Your next appointment:   6 month(s)  The format for your next appointment:   In Person  Provider:   Rozann Lesches, MD   Other Instructions Thank you for choosing [facility]!
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
## 2020-03-29 ENCOUNTER — Ambulatory Visit (INDEPENDENT_AMBULATORY_CARE_PROVIDER_SITE_OTHER): Payer: Medicare Other | Admitting: Family Medicine

## 2020-03-29 ENCOUNTER — Other Ambulatory Visit: Payer: Self-pay

## 2020-03-29 ENCOUNTER — Encounter: Payer: Self-pay | Admitting: Family Medicine

## 2020-03-29 VITALS — BP 138/64 | HR 92 | Temp 98.2°F | Wt 123.0 lb

## 2020-03-29 DIAGNOSIS — R270 Ataxia, unspecified: Secondary | ICD-10-CM | POA: Diagnosis not present

## 2020-03-29 DIAGNOSIS — S069X1D Unspecified intracranial injury with loss of consciousness of 30 minutes or less, subsequent encounter: Secondary | ICD-10-CM
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
## 2020-03-30 ENCOUNTER — Encounter (HOSPITAL_COMMUNITY): Payer: Self-pay | Admitting: Nurse Practitioner

## 2020-03-30 ENCOUNTER — Ambulatory Visit (HOSPITAL_COMMUNITY)
Admit: 2020-03-30 | Discharge: 2020-03-30 | Disposition: A | Discharge status: Home / Self Care | Payer: Medicare Other | Attending: Nurse Practitioner | Admitting: Nurse Practitioner

## 2020-03-30 VITALS — BP 122/80 | HR 76 | Ht 61.0 in | Wt 123.2 lb

## 2020-03-30 DIAGNOSIS — I2510 Atherosclerotic heart disease of native coronary artery without angina pectoris: Secondary | ICD-10-CM | POA: Insufficient documentation

## 2020-03-30 DIAGNOSIS — S065XAA Traumatic subdural hemorrhage with loss of consciousness status unknown, initial encounter: Secondary | ICD-10-CM

## 2020-03-30 DIAGNOSIS — I4819 Other persistent atrial fibrillation: Secondary | ICD-10-CM

## 2020-03-30 DIAGNOSIS — Z79899 Other long term (current) drug therapy: Secondary | ICD-10-CM | POA: Insufficient documentation

## 2020-03-30 DIAGNOSIS — Z87891 Personal history of nicotine dependence: Secondary | ICD-10-CM | POA: Diagnosis not present

## 2020-03-30 DIAGNOSIS — I491 Atrial premature depolarization: Secondary | ICD-10-CM | POA: Insufficient documentation

## 2020-03-30 DIAGNOSIS — I4892 Unspecified atrial flutter: Secondary | ICD-10-CM | POA: Diagnosis not present

## 2020-03-30 DIAGNOSIS — I4891 Unspecified atrial fibrillation: Secondary | ICD-10-CM

## 2020-03-30 DIAGNOSIS — I480 Paroxysmal atrial fibrillation: Secondary | ICD-10-CM | POA: Insufficient documentation

## 2020-03-30 DIAGNOSIS — I10 Essential (primary) hypertension: Secondary | ICD-10-CM | POA: Diagnosis not present

## 2020-03-30 DIAGNOSIS — I4510 Unspecified right bundle-branch block: Secondary | ICD-10-CM | POA: Insufficient documentation

## 2020-03-30 DIAGNOSIS — S065X9A Traumatic subdural hemorrhage with loss of consciousness of unspecified duration, initial encounter: Secondary | ICD-10-CM | POA: Diagnosis not present

## 2020-03-30 DIAGNOSIS — Z952 Presence of prosthetic heart valve: Secondary | ICD-10-CM | POA: Insufficient documentation

## 2020-03-31 ENCOUNTER — Encounter: Payer: Self-pay | Admitting: Physical Medicine & Rehabilitation

## 2020-03-31 ENCOUNTER — Encounter: Payer: Medicare Other | Attending: Physical Medicine & Rehabilitation | Admitting: Physical Medicine & Rehabilitation

## 2020-03-31 ENCOUNTER — Other Ambulatory Visit: Payer: Self-pay

## 2020-03-31 ENCOUNTER — Encounter (HOSPITAL_COMMUNITY): Payer: Self-pay | Admitting: Nurse Practitioner

## 2020-03-31 VITALS — BP 145/97 | HR 108 | Temp 98.5°F | Ht 61.0 in | Wt 123.0 lb

## 2020-03-31 DIAGNOSIS — K5901 Slow transit constipation: Secondary | ICD-10-CM | POA: Diagnosis not present

## 2020-03-31 DIAGNOSIS — I4819 Other persistent atrial fibrillation: Secondary | ICD-10-CM | POA: Diagnosis not present

## 2020-03-31 DIAGNOSIS — S065X0S Traumatic subdural hemorrhage without loss of consciousness, sequela: Secondary | ICD-10-CM | POA: Insufficient documentation

## 2020-03-31 NOTE — Patient Instructions (Addendum)
BOWELS: MIRALAX DAILY SENOKOT-S, TAKE 2 TABS AT BEDTIME  IF YOU GO A COUPLE DAYS WITHOUT A BM, THEN YOU TRY A DULCOLAX SUPPOSITORY   PLEASE FEEL FREE TO CALL OUR OFFICE WITH ANY PROBLEMS OR QUESTIONS ([phone])                                @                 @@               @@@                        @@@@                      @@@@@         @@@@@@                  @@@@@@@                @@@@@@@@              @@@@@@@@@             @@@@@@@@@@       IIII                  IIII                                                        HAPPY HOLIDAYS!!!!!
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
## 2020-03-31 NOTE — Addendum Note (Signed)
Encounter addended by: Sherran Needs, NP on: 03/31/2020 11:58 AM  Actions taken: Clinical Note Signed
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
## 2020-04-01 NOTE — Telephone Encounter (Signed)
Please call neurosurgery's office again today and ask them to see if they can address request today as below, have not heard back yet. I am out of the office but Cecilie Kicks is watching my box.  Patient has since seen afib clinic who is awaiting reply as well - as an update, they are wanting to know if she can do Eliquis instead of Coumadin when neuro gives all clear to restart blood thinners. Thank you so much! Neoma Uhrich

## 2020-04-01 NOTE — Telephone Encounter (Signed)
I spoke with front office at Dr.Jones's office.I am informed that both Dr.Jones and his NP are out of the office until Tuesday 04/06/20. We will return call then.

## 2020-04-05 ENCOUNTER — Telehealth: Payer: Self-pay

## 2020-04-05 ENCOUNTER — Other Ambulatory Visit: Payer: Self-pay

## 2020-04-05 NOTE — Telephone Encounter (Signed)
Hannah Roy called to say she has noted an increased BP over past several days and is concerned. BP readings: 143/103, 150/105, 150/109, 148/96, Current HR is 106   She is taking Lopressor 100 mg BID    Can you please advise?

## 2020-04-05 NOTE — Telephone Encounter (Addendum)
Hi [name], can you find out if this is a decision that someone covering for Dr. Ronnald Ramp today would feel comfortable making, or if they want to wait until tomorrow for their input? Would hope to restart anticoag as soon as we get their all-clear. Thank you.

## 2020-04-05 NOTE — Telephone Encounter (Signed)
I spoke with Rudi Coco, NP and she wants patient to be cleared by neurosurgery at apt tomorrow before any medication changes are made. She also recommends that Mrs.Stimson avoid salt intake. Patient agrees with plan.

## 2020-04-06 ENCOUNTER — Other Ambulatory Visit: Payer: Self-pay | Admitting: Neurological Surgery

## 2020-04-06 DIAGNOSIS — S065XAA Traumatic subdural hemorrhage with loss of consciousness status unknown, initial encounter: Secondary | ICD-10-CM

## 2020-04-06 DIAGNOSIS — S065X9A Traumatic subdural hemorrhage with loss of consciousness of unspecified duration, initial encounter: Secondary | ICD-10-CM | POA: Diagnosis not present

## 2020-04-06 NOTE — Telephone Encounter (Signed)
Please touch base with neurosurgery office to find out if they gave all clear today to start anticoagulation with Eliquis. When we get this info please route back to both me and Rudi Coco. Lupita Leash was awaiting this information as well before providing further recs per 04/05/20 phone note.

## 2020-04-06 NOTE — Telephone Encounter (Signed)
I spoke with Hannah Roy in Dr.Jones's office. He has ordered a STAT head CT and as soon as he reviews that, his hopes are that she can re-start anticoagulants.   There was a mix up with CT scheduling, they will change the one scheduled for 04/23/20

## 2020-04-06 NOTE — Addendum Note (Signed)
Encounter addended by: Newman Nip, NP on: 04/06/2020 2:19 PM  Actions taken: Clinical Note Signed

## 2020-04-06 NOTE — Telephone Encounter (Signed)
I spoke with Hillary at Dr.Jones's office.They will be seeing Hannah Roy later this afternoon at 3:30 pm

## 2020-04-07 ENCOUNTER — Ambulatory Visit
Admission: RE | Admit: 2020-04-07 | Discharge: 2020-04-07 | Disposition: A | Discharge status: Home / Self Care | Payer: Medicare Other | Source: Ambulatory Visit | Attending: Neurological Surgery | Admitting: Neurological Surgery

## 2020-04-07 ENCOUNTER — Telehealth (HOSPITAL_COMMUNITY): Payer: Self-pay | Admitting: Nurse Practitioner

## 2020-04-07 DIAGNOSIS — S065XAA Traumatic subdural hemorrhage with loss of consciousness status unknown, initial encounter: Secondary | ICD-10-CM

## 2020-04-07 DIAGNOSIS — R519 Headache, unspecified: Secondary | ICD-10-CM | POA: Diagnosis not present

## 2020-04-07 NOTE — Telephone Encounter (Signed)
Received  notification form neurosurgeon's office that Hannah Roy was ok to restart  her anticoagulation after he reviewed CT of head from  today.  We will start eliquis 5 mg bid.

## 2020-04-07 NOTE — Telephone Encounter (Signed)
Per C. Carlton's separate staff message today - "Mrs.Patman said Dr.Jones is going to call her tomorrow at 130 pm to discuss findings" Await NS recs

## 2020-04-08 ENCOUNTER — Telehealth: Payer: Self-pay | Admitting: Pharmacist

## 2020-04-08 ENCOUNTER — Other Ambulatory Visit: Payer: Self-pay | Admitting: Neurological Surgery

## 2020-04-08 ENCOUNTER — Telehealth: Payer: Self-pay

## 2020-04-08 DIAGNOSIS — S065XAA Traumatic subdural hemorrhage with loss of consciousness status unknown, initial encounter: Secondary | ICD-10-CM

## 2020-04-08 DIAGNOSIS — S065X9A Traumatic subdural hemorrhage with loss of consciousness of unspecified duration, initial encounter: Secondary | ICD-10-CM | POA: Diagnosis not present

## 2020-04-08 MED ORDER — APIXABAN 5 MG PO TABS: 5.0000 mg | ORAL_TABLET | Freq: Two times a day (BID) | ORAL | 6 refills | Status: DC

## 2020-04-08 MED ORDER — AMLODIPINE BESYLATE 2.5 MG PO TABS: 2.5000 mg | ORAL_TABLET | Freq: Every day | ORAL | 3 refills | Status: DC

## 2020-04-08 NOTE — Telephone Encounter (Signed)
Dr.Jones's office note in "Media"

## 2020-04-08 NOTE — Telephone Encounter (Signed)
Mrs Puccini called.She saw Dr.Jones and states he told her she still had a small amount of blood noted on head CT   but she could resume anticoagulants.    I will FYI  D.Noralyn Pick, N.P. and D.Shea Evans, PA-C

## 2020-04-08 NOTE — Telephone Encounter (Addendum)
Thank you for the update! [name], can you call the neurosurgeon's office to confirm from them directly that we are OK to start Eliquis 5mg  BID? If we get the OK, please send in rx and let pt know. Thank you! [name]

## 2020-04-08 NOTE — Addendum Note (Signed)
Addended by: Marlyn Corporal A on: 04/08/2020 01:28 PM   Modules accepted: Orders

## 2020-04-08 NOTE — Addendum Note (Signed)
Addended by: Shona Simpson on: 04/08/2020 02:02 PM   Modules accepted: Orders

## 2020-04-08 NOTE — Telephone Encounter (Signed)
Per Rudi Coco NP will restart amlodipine 2.5mg  once a day.

## 2020-04-08 NOTE — Telephone Encounter (Signed)
Medication list reviewed in anticipation of upcoming Tikosyn initiation. Patient is taking trazodone prn for sleep which is QTc prolonging. Recommend that she stop trazodone prior to Tikosyn admission.   Patient is anticoagulated on Eliquis 5mg  BID on the appropriate dose, anticoagulation resumed today. Pt will need to continue on Eliquis without missing a dose for the next 3 weeks before she can be initiated on Tikosyn.  Patient will need to be counseled to avoid use of Benadryl while on Tikosyn and in the 2-3 days prior to Tikosyn initiation.

## 2020-04-08 NOTE — Telephone Encounter (Signed)
Received faxed office note from Dr.David Jones.date of visit was 04/08/20  Note scanned into chart   Dr.Jones comments under Plan that "it is safe to resume her anticoagulants as the risk of intracranial bleeding is probably less than the risk of stroke at this point."     Patient notified, samples Eliquis 5 mg BID, # 42, lot # [id number], exp 10/2021

## 2020-04-08 NOTE — Telephone Encounter (Signed)
Thank you! Will await input from Lupita Leash on when she'd like to see patient back.

## 2020-04-16 DIAGNOSIS — Z23 Encounter for immunization: Secondary | ICD-10-CM | POA: Diagnosis not present

## 2020-04-20 ENCOUNTER — Encounter (HOSPITAL_COMMUNITY): Payer: Self-pay | Admitting: Physical Therapy

## 2020-04-20 ENCOUNTER — Other Ambulatory Visit: Payer: Self-pay

## 2020-04-20 ENCOUNTER — Ambulatory Visit (HOSPITAL_COMMUNITY): Payer: Medicare Other | Attending: Physician Assistant | Admitting: Occupational Therapy

## 2020-04-20 ENCOUNTER — Encounter (HOSPITAL_COMMUNITY): Payer: Self-pay | Admitting: Occupational Therapy

## 2020-04-20 ENCOUNTER — Ambulatory Visit (HOSPITAL_COMMUNITY): Payer: Medicare Other | Admitting: Physical Therapy

## 2020-04-20 DIAGNOSIS — R42 Dizziness and giddiness: Secondary | ICD-10-CM | POA: Insufficient documentation

## 2020-04-20 DIAGNOSIS — S069X0S Unspecified intracranial injury without loss of consciousness, sequela: Secondary | ICD-10-CM | POA: Diagnosis not present

## 2020-04-20 DIAGNOSIS — M6281 Muscle weakness (generalized): Secondary | ICD-10-CM | POA: Insufficient documentation

## 2020-04-20 DIAGNOSIS — R262 Difficulty in walking, not elsewhere classified: Secondary | ICD-10-CM

## 2020-04-20 DIAGNOSIS — R29818 Other symptoms and signs involving the nervous system: Secondary | ICD-10-CM | POA: Diagnosis not present

## 2020-04-20 DIAGNOSIS — R41841 Cognitive communication deficit: Secondary | ICD-10-CM | POA: Diagnosis not present

## 2020-04-20 MED ORDER — METOPROLOL TARTRATE 100 MG PO TABS: 100.0000 mg | ORAL_TABLET | Freq: Two times a day (BID) | ORAL | 0 refills | Status: DC
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
## 2020-04-20 NOTE — Telephone Encounter (Signed)
Pt request 30 day refill of lopressor 100 mg bid to [facility]- done
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
## 2020-04-22 ENCOUNTER — Other Ambulatory Visit: Payer: Medicare Other

## 2020-04-22 ENCOUNTER — Ambulatory Visit (HOSPITAL_COMMUNITY): Payer: Medicare Other | Admitting: Speech Pathology

## 2020-04-22 ENCOUNTER — Encounter (HOSPITAL_COMMUNITY): Payer: Self-pay | Admitting: Speech Pathology

## 2020-04-22 ENCOUNTER — Other Ambulatory Visit: Payer: Self-pay

## 2020-04-22 ENCOUNTER — Ambulatory Visit (HOSPITAL_COMMUNITY): Payer: Medicare Other | Admitting: Physical Therapy

## 2020-04-22 DIAGNOSIS — R41841 Cognitive communication deficit: Secondary | ICD-10-CM

## 2020-04-22 DIAGNOSIS — M6281 Muscle weakness (generalized): Secondary | ICD-10-CM

## 2020-04-22 DIAGNOSIS — R42 Dizziness and giddiness: Secondary | ICD-10-CM | POA: Diagnosis not present

## 2020-04-22 DIAGNOSIS — R262 Difficulty in walking, not elsewhere classified: Secondary | ICD-10-CM

## 2020-04-22 DIAGNOSIS — S069X0S Unspecified intracranial injury without loss of consciousness, sequela: Secondary | ICD-10-CM | POA: Diagnosis not present

## 2020-04-22 DIAGNOSIS — R29818 Other symptoms and signs involving the nervous system: Secondary | ICD-10-CM | POA: Diagnosis not present
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
## 2020-04-22 NOTE — Patient Instructions (Signed)
EXTENSION: Standing (Active)    Stand, both feet flat. Draw right leg behind body as far as possible. Hip Abduction (Standing)    Stand with support. Squeeze buttock and hold. Lift right leg out to side, keeping toe forward.   Heel Raises    Stand with support. Tighten pelvic floor and hold. With knees straight, raise heels off ground.    Tandem Stance    Right foot in front of left, heel touching toe both feet "straight ahead". Stand on Foot Triangle of Support with both feet. Balance in this position _30__ seconds. Do with left foot in front of right.  Single Leg - Eyes Open    Holding support, lift right leg while maintaining balance over other leg. Progress to removing hands from support surface for longer periods of time. Hold_30___ seconds.   Functional Quadriceps: Sit to Stand    Sit on edge of chair, feet flat on floor. Stand upright, extending knees fully.
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
## 2020-04-23 ENCOUNTER — Other Ambulatory Visit: Payer: Medicare Other

## 2020-04-27 ENCOUNTER — Encounter (HOSPITAL_COMMUNITY): Payer: Medicare Other

## 2020-04-27 NOTE — Telephone Encounter (Signed)
Pt notified to stop trazodone.  

## 2020-04-28 ENCOUNTER — Ambulatory Visit (HOSPITAL_COMMUNITY): Payer: Medicare Other | Admitting: Physical Therapy

## 2020-04-28 ENCOUNTER — Other Ambulatory Visit: Payer: Self-pay

## 2020-04-28 DIAGNOSIS — R42 Dizziness and giddiness: Secondary | ICD-10-CM

## 2020-04-28 DIAGNOSIS — R262 Difficulty in walking, not elsewhere classified: Secondary | ICD-10-CM

## 2020-04-28 DIAGNOSIS — R41841 Cognitive communication deficit: Secondary | ICD-10-CM | POA: Diagnosis not present

## 2020-04-28 DIAGNOSIS — M6281 Muscle weakness (generalized): Secondary | ICD-10-CM | POA: Diagnosis not present

## 2020-04-28 DIAGNOSIS — R29818 Other symptoms and signs involving the nervous system: Secondary | ICD-10-CM | POA: Diagnosis not present

## 2020-04-28 DIAGNOSIS — S069X0S Unspecified intracranial injury without loss of consciousness, sequela: Secondary | ICD-10-CM | POA: Diagnosis not present
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
## 2020-04-30 ENCOUNTER — Ambulatory Visit: Payer: Self-pay | Admitting: *Deleted

## 2020-04-30 ENCOUNTER — Other Ambulatory Visit (HOSPITAL_COMMUNITY): Payer: Medicare Other

## 2020-05-04 ENCOUNTER — Ambulatory Visit (HOSPITAL_COMMUNITY): Payer: Medicare Other | Admitting: Physician Assistant

## 2020-05-04 ENCOUNTER — Encounter (HOSPITAL_COMMUNITY): Payer: Medicare Other | Admitting: Physical Therapy

## 2020-05-06 ENCOUNTER — Encounter (HOSPITAL_COMMUNITY): Payer: Medicare Other | Admitting: Physical Therapy

## 2020-05-10 ENCOUNTER — Encounter: Payer: Self-pay | Admitting: Family Medicine

## 2020-05-10 ENCOUNTER — Other Ambulatory Visit: Payer: Self-pay

## 2020-05-10 ENCOUNTER — Encounter (HOSPITAL_COMMUNITY): Payer: Medicare Other | Admitting: Physical Therapy

## 2020-05-10 ENCOUNTER — Ambulatory Visit (INDEPENDENT_AMBULATORY_CARE_PROVIDER_SITE_OTHER): Payer: Medicare Other | Admitting: Family Medicine

## 2020-05-10 VITALS — BP 138/88 | HR 67 | Temp 97.6°F | Wt 119.4 lb

## 2020-05-10 DIAGNOSIS — N289 Disorder of kidney and ureter, unspecified: Secondary | ICD-10-CM

## 2020-05-10 DIAGNOSIS — D509 Iron deficiency anemia, unspecified: Secondary | ICD-10-CM | POA: Diagnosis not present

## 2020-05-10 DIAGNOSIS — S065X0S Traumatic subdural hemorrhage without loss of consciousness, sequela: Secondary | ICD-10-CM

## 2020-05-10 DIAGNOSIS — E7849 Other hyperlipidemia: Secondary | ICD-10-CM | POA: Diagnosis not present

## 2020-05-10 MED ORDER — AMLODIPINE BESYLATE 5 MG PO TABS: 5.0000 mg | ORAL_TABLET | Freq: Every day | ORAL | 1 refills | Status: DC

## 2020-05-10 MED ORDER — FERROUS SULFATE 325 (65 FE) MG PO TABS: 325.0000 mg | ORAL_TABLET | Freq: Two times a day (BID) | ORAL | 1 refills | Status: DC

## 2020-05-10 MED ORDER — METOPROLOL TARTRATE 100 MG PO TABS: 100.0000 mg | ORAL_TABLET | Freq: Two times a day (BID) | ORAL | 5 refills | Status: DC
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
## 2020-05-11 ENCOUNTER — Ambulatory Visit
Admission: RE | Admit: 2020-05-11 | Discharge: 2020-05-11 | Disposition: A | Discharge status: Home / Self Care | Payer: Medicare Other | Source: Ambulatory Visit | Attending: Neurological Surgery | Admitting: Neurological Surgery

## 2020-05-11 DIAGNOSIS — R519 Headache, unspecified: Secondary | ICD-10-CM | POA: Diagnosis not present

## 2020-05-11 DIAGNOSIS — S065XAA Traumatic subdural hemorrhage with loss of consciousness status unknown, initial encounter: Secondary | ICD-10-CM

## 2020-05-11 DIAGNOSIS — S065X9A Traumatic subdural hemorrhage with loss of consciousness of unspecified duration, initial encounter: Secondary | ICD-10-CM

## 2020-05-11 LAB — CBC WITH DIFFERENTIAL/PLATELET
Basophils Absolute: 0 10*3/uL (ref 0.0–0.2)
Basos: 1 %
EOS (ABSOLUTE): 0.1 10*3/uL (ref 0.0–0.4)
Eos: 2 %
Hematocrit: 39.3 % (ref 34.0–46.6)
Hemoglobin: 13.1 g/dL (ref 11.1–15.9)
Immature Grans (Abs): 0 10*3/uL (ref 0.0–0.1)
Immature Granulocytes: 1 %
Lymphocytes Absolute: 1.4 10*3/uL (ref 0.7–3.1)
Lymphs: 23 %
MCH: 32.3 pg (ref 26.6–33.0)
MCHC: 33.3 g/dL (ref 31.5–35.7)
MCV: 97 fL (ref 79–97)
Monocytes Absolute: 0.4 10*3/uL (ref 0.1–0.9)
Monocytes: 6 %
Neutrophils Absolute: 4.2 10*3/uL (ref 1.4–7.0)
Neutrophils: 67 %
Platelets: 177 10*3/uL (ref 150–450)
RBC: 4.06 x10E6/uL (ref 3.77–5.28)
RDW: 12.6 % (ref 11.7–15.4)
WBC: 6.2 10*3/uL (ref 3.4–10.8)

## 2020-05-11 LAB — IRON AND TIBC
Iron Saturation: 57 % — ABNORMAL HIGH (ref 15–55)
Iron: 133 ug/dL (ref 27–139)
Total Iron Binding Capacity: 235 ug/dL — ABNORMAL LOW (ref 250–450)
UIBC: 102 ug/dL — ABNORMAL LOW (ref 118–369)

## 2020-05-11 LAB — FERRITIN: Ferritin: 368 ng/mL — ABNORMAL HIGH (ref 15–150)

## 2020-05-11 LAB — BASIC METABOLIC PANEL
BUN/Creatinine Ratio: 16 (ref 12–28)
BUN: 11 mg/dL (ref 8–27)
CO2: 26 mmol/L (ref 20–29)
Calcium: 9.4 mg/dL (ref 8.7–10.3)
Chloride: 102 mmol/L (ref 96–106)
Creatinine, Ser: 0.69 mg/dL (ref 0.57–1.00)
GFR calc Af Amer: 97 mL/min/{1.73_m2} (ref >59)
GFR calc non Af Amer: 84 mL/min/{1.73_m2} (ref >59)
Glucose: 92 mg/dL (ref 65–99)
Potassium: 3.8 mmol/L (ref 3.5–5.2)
Sodium: 141 mmol/L (ref 134–144)

## 2020-05-11 LAB — LIPID PANEL
Chol/HDL Ratio: 2.8 ratio (ref 0.0–4.4)
Cholesterol, Total: 160 mg/dL (ref 100–199)
HDL: 57 mg/dL (ref >39)
LDL Chol Calc (NIH): 85 mg/dL (ref 0–99)
Triglycerides: 100 mg/dL (ref 0–149)
VLDL Cholesterol Cal: 18 mg/dL (ref 5–40)

## 2020-05-12 ENCOUNTER — Ambulatory Visit (HOSPITAL_COMMUNITY): Payer: Medicare Other | Attending: Physician Assistant | Admitting: Physical Therapy

## 2020-05-12 ENCOUNTER — Other Ambulatory Visit: Payer: Self-pay

## 2020-05-12 ENCOUNTER — Other Ambulatory Visit: Payer: Self-pay | Admitting: *Deleted

## 2020-05-12 ENCOUNTER — Telehealth: Payer: Self-pay

## 2020-05-12 DIAGNOSIS — M6281 Muscle weakness (generalized): Secondary | ICD-10-CM

## 2020-05-12 DIAGNOSIS — R262 Difficulty in walking, not elsewhere classified: Secondary | ICD-10-CM | POA: Diagnosis not present

## 2020-05-12 DIAGNOSIS — R42 Dizziness and giddiness: Secondary | ICD-10-CM | POA: Diagnosis not present
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
## 2020-05-12 NOTE — Telephone Encounter (Signed)
Pt is wanting blood work results mailed to her when they come back   Pt call back [phone]

## 2020-05-12 NOTE — Telephone Encounter (Signed)
See result note. I called and discussed with pt and mailed a copy of her bw results.

## 2020-05-18 ENCOUNTER — Ambulatory Visit (HOSPITAL_COMMUNITY): Payer: Medicare Other | Admitting: Physical Therapy

## 2020-05-18 ENCOUNTER — Other Ambulatory Visit: Payer: Self-pay

## 2020-05-18 DIAGNOSIS — R42 Dizziness and giddiness: Secondary | ICD-10-CM

## 2020-05-18 DIAGNOSIS — M6281 Muscle weakness (generalized): Secondary | ICD-10-CM

## 2020-05-18 DIAGNOSIS — R262 Difficulty in walking, not elsewhere classified: Secondary | ICD-10-CM
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
## 2020-05-20 ENCOUNTER — Ambulatory Visit (HOSPITAL_COMMUNITY): Payer: Medicare Other | Admitting: Physical Therapy

## 2020-05-20 ENCOUNTER — Encounter (HOSPITAL_COMMUNITY): Payer: Self-pay | Admitting: Physical Therapy

## 2020-05-20 ENCOUNTER — Other Ambulatory Visit: Payer: Self-pay

## 2020-05-20 DIAGNOSIS — R262 Difficulty in walking, not elsewhere classified: Secondary | ICD-10-CM | POA: Diagnosis not present

## 2020-05-20 DIAGNOSIS — R42 Dizziness and giddiness: Secondary | ICD-10-CM

## 2020-05-20 DIAGNOSIS — M6281 Muscle weakness (generalized): Secondary | ICD-10-CM
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
## 2020-05-21 ENCOUNTER — Other Ambulatory Visit (HOSPITAL_COMMUNITY)
Admission: RE | Admit: 2020-05-21 | Discharge: 2020-05-21 | Disposition: A | Discharge status: Home / Self Care | Payer: Medicare Other | Source: Ambulatory Visit | Attending: Nurse Practitioner | Admitting: Nurse Practitioner

## 2020-05-21 DIAGNOSIS — Z01812 Encounter for preprocedural laboratory examination: Secondary | ICD-10-CM | POA: Diagnosis not present

## 2020-05-21 DIAGNOSIS — Z20822 Contact with and (suspected) exposure to covid-19: Secondary | ICD-10-CM | POA: Diagnosis not present

## 2020-05-21 LAB — SARS CORONAVIRUS 2 (TAT 6-24 HRS): SARS Coronavirus 2: NEGATIVE

## 2020-05-25 ENCOUNTER — Other Ambulatory Visit: Payer: Self-pay

## 2020-05-25 ENCOUNTER — Ambulatory Visit (HOSPITAL_COMMUNITY)
Admission: RE | Admit: 2020-05-25 | Discharge: 2020-05-25 | Disposition: A | Discharge status: Home / Self Care | Payer: Medicare Other | Source: Ambulatory Visit | Attending: Nurse Practitioner | Admitting: Nurse Practitioner

## 2020-05-25 ENCOUNTER — Inpatient Hospital Stay (HOSPITAL_COMMUNITY)
Admission: RE | Admit: 2020-05-25 | Discharge: 2020-05-28 | DRG: 310 | Disposition: A | Discharge status: Home / Self Care | Payer: Medicare Other | Source: Ambulatory Visit | Attending: Internal Medicine | Admitting: Internal Medicine

## 2020-05-25 ENCOUNTER — Encounter (HOSPITAL_COMMUNITY): Payer: Self-pay | Admitting: Internal Medicine

## 2020-05-25 VITALS — BP 140/96 | HR 85 | Ht 61.0 in | Wt 120.2 lb

## 2020-05-25 DIAGNOSIS — I10 Essential (primary) hypertension: Secondary | ICD-10-CM | POA: Diagnosis present

## 2020-05-25 DIAGNOSIS — Z20822 Contact with and (suspected) exposure to covid-19: Secondary | ICD-10-CM | POA: Diagnosis not present

## 2020-05-25 DIAGNOSIS — Z953 Presence of xenogenic heart valve: Secondary | ICD-10-CM | POA: Diagnosis not present

## 2020-05-25 DIAGNOSIS — I712 Thoracic aortic aneurysm, without rupture: Secondary | ICD-10-CM | POA: Diagnosis present

## 2020-05-25 DIAGNOSIS — Z7901 Long term (current) use of anticoagulants: Secondary | ICD-10-CM

## 2020-05-25 DIAGNOSIS — Z79899 Other long term (current) drug therapy: Secondary | ICD-10-CM | POA: Diagnosis not present

## 2020-05-25 DIAGNOSIS — I484 Atypical atrial flutter: Secondary | ICD-10-CM | POA: Diagnosis present

## 2020-05-25 DIAGNOSIS — I4819 Other persistent atrial fibrillation: Secondary | ICD-10-CM | POA: Diagnosis not present

## 2020-05-25 DIAGNOSIS — I4891 Unspecified atrial fibrillation: Secondary | ICD-10-CM | POA: Diagnosis present

## 2020-05-25 DIAGNOSIS — Z87891 Personal history of nicotine dependence: Secondary | ICD-10-CM | POA: Diagnosis not present

## 2020-05-25 LAB — BASIC METABOLIC PANEL
Anion gap: 10 (ref 5–15)
BUN: 19 mg/dL (ref 8–23)
CO2: 24 mmol/L (ref 22–32)
Calcium: 8.9 mg/dL (ref 8.9–10.3)
Chloride: 105 mmol/L (ref 98–111)
Creatinine, Ser: 0.88 mg/dL (ref 0.44–1.00)
GFR, Estimated: >60 mL/min (ref >60)
Glucose, Bld: 96 mg/dL (ref 70–99)
Potassium: 4 mmol/L (ref 3.5–5.1)
Sodium: 139 mmol/L (ref 135–145)

## 2020-05-25 LAB — MAGNESIUM: Magnesium: 2.2 mg/dL (ref 1.7–2.4)

## 2020-05-25 MED ORDER — SENNOSIDES-DOCUSATE SODIUM 8.6-50 MG PO TABS
2.0000 | ORAL_TABLET | Freq: Every day | ORAL | Status: DC
  Administered 2020-05-25: 2 via ORAL
  Administered 2020-05-27: 1 via ORAL
  Filled 2020-05-25 (×3): qty 2

## 2020-05-25 MED ORDER — ACETAMINOPHEN 325 MG PO TABS: 325.0000 mg | ORAL_TABLET | ORAL | Status: DC | PRN

## 2020-05-25 MED ORDER — ROSUVASTATIN CALCIUM 5 MG PO TABS
5.0000 mg | ORAL_TABLET | ORAL | Status: DC
Start: 2020-05-26 — End: 2020-05-28
  Administered 2020-05-26: 5 mg via ORAL
  Filled 2020-05-25: qty 1

## 2020-05-25 MED ORDER — SODIUM CHLORIDE 0.9% FLUSH
3.0000 mL | Freq: Two times a day (BID) | INTRAVENOUS | Status: DC
  Administered 2020-05-25 – 2020-05-28 (×4): 3 mL via INTRAVENOUS

## 2020-05-25 MED ORDER — PANTOPRAZOLE SODIUM 40 MG PO TBEC
40.0000 mg | DELAYED_RELEASE_TABLET | Freq: Every day | ORAL | Status: DC
Start: 2020-05-26 — End: 2020-05-28
  Administered 2020-05-26 – 2020-05-28 (×3): 40 mg via ORAL
  Filled 2020-05-25 (×3): qty 1

## 2020-05-25 MED ORDER — AMLODIPINE BESYLATE 5 MG PO TABS
5.0000 mg | ORAL_TABLET | Freq: Every day | ORAL | Status: DC
  Administered 2020-05-25 – 2020-05-27 (×3): 5 mg via ORAL
  Filled 2020-05-25 (×4): qty 1

## 2020-05-25 MED ORDER — SODIUM CHLORIDE 0.9% FLUSH: 3.0000 mL | INTRAVENOUS | Status: DC | PRN

## 2020-05-25 MED ORDER — SODIUM CHLORIDE 0.9 % IV SOLN: 250.0000 mL | INTRAVENOUS | Status: DC | PRN

## 2020-05-25 MED ORDER — METOPROLOL TARTRATE 100 MG PO TABS
100.0000 mg | ORAL_TABLET | Freq: Two times a day (BID) | ORAL | Status: DC
  Administered 2020-05-25 – 2020-05-27 (×4): 100 mg via ORAL
  Filled 2020-05-25 (×4): qty 1

## 2020-05-25 MED ORDER — DOFETILIDE 250 MCG PO CAPS
250.0000 ug | ORAL_CAPSULE | Freq: Two times a day (BID) | ORAL | Status: DC
  Administered 2020-05-25 – 2020-05-28 (×6): 250 ug via ORAL
  Filled 2020-05-25 (×6): qty 1

## 2020-05-25 MED ORDER — APIXABAN 5 MG PO TABS
5.0000 mg | ORAL_TABLET | Freq: Two times a day (BID) | ORAL | Status: DC
  Administered 2020-05-25 – 2020-05-28 (×6): 5 mg via ORAL
  Filled 2020-05-25 (×6): qty 1

## 2020-05-25 NOTE — Care Management (Signed)
1420 05-25-20 Benefits check submitted for Tikos[employer]. Case Manager will follow for cost and discuss where Rx's will need to be sent to once stable. Bethena Roys , RN,BSN Case Manager
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
## 2020-05-25 NOTE — TOC Benefit Eligibility Note (Signed)
Transition of Care [facility]) Benefit Eligibility Note    Patient Details  Name: Hannah Roy MRN: [id number] Date of Birth: 1941-02-23   Medication/Dose: DOFETILIDE  125  MCG CO-PAY-  $86.90   250 MCG  CO-PAY- $ [phone]      500 MCG  BID  Covered?: Yes  Tier:  (TIER- 4 DRUG)  Prescription Coverage Preferred Pharmacy: [facility] with Person/Company/Phone Number:: CHELSIE  @  [employer] MM #  [phone]  Co-Pay: $86.90  Prior Approval: No  Deductible: Unmet (OUT-OF-POCKET-UNMET)  Additional Notes: TIKOSYN  125 MCG   250 MCG   500MCG BID : NOT COVER COVER / NON-FORMULARY  P/A YES  # [phone]    Memory Argue Phone Number: 05/25/2020, 4:20 PM
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
## 2020-05-25 NOTE — Progress Notes (Signed)
EKG is reviewed with Dr. [name] OK to proceed with Tikosyn initiation this evening  Tommye Standard, PA-C

## 2020-05-25 NOTE — Progress Notes (Signed)
Pharmacy: Dofetilide (Tikosyn) - Initial Consult Assessment and Electrolyte Replacement  Pharmacy consulted to assist in monitoring and replacing electrolytes in this 80 y.o. female admitted on 05/25/2020 undergoing dofetilide initiation.   Assessment:  Patient Exclusion Criteria: If any screening criteria checked as "Yes", then  patient  should NOT receive dofetilide until criteria item is corrected.  If "Yes" please indicate correction plan.  YES  NO Patient  Exclusion Criteria Correction Plan   [x]   []   Baseline QTc interval is greater than or equal to 440 msec. IF above YES box checked dofetilide contraindicated unless patient has ICD; then may proceed if QTc 500-550 msec or with known ventricular conduction abnormalities may proceed with QTc 550-600 msec. QTc = 495 Proceeding with tikosyn with close monitoring   []   [x]   Patient is known or suspected to have a digoxin level greater than 2 ng/ml: No results found for: DIGOXIN     []   [x]   Creatinine clearance less than 20 ml/min (calculated using Cockcroft-Gault, actual body weight and serum creatinine): CrCl ~ 46    []   [x]  Patient has received drugs known to prolong the QT intervals within the last 48 hours (phenothiazines, tricyclics or tetracyclic antidepressants, erythromycin, H-1 antihistamines, cisapride, fluoroquinolones, azithromycin, ondansetron).   Updated information on QT prolonging agents is available to be searched on the following database:QT prolonging agents     []   [x]   Patient received a dose of hydrochlorothiazide (Oretic) alone or in any combination including triamterene (Dyazide, Maxzide) in the last 48 hours.    []   [x]  Patient received a medication known to increase dofetilide plasma concentrations prior to initial dofetilide dose:  . Trimethoprim (Primsol, Proloprim) in the last 36 hours . Verapamil (Calan, Verelan) in the last 36 hours or a sustained release dose in the last 72 hours . Megestrol  (Megace) in the last 5 days  . Cimetidine (Tagamet) in the last 6 hours . Ketoconazole (Nizoral) in the last 24 hours . Itraconazole (Sporanox) in the last 48 hours  . Prochlorperazine (Compazine) in the last 36 hours     []   [x]   Patient is known to have a history of torsades de pointes; congenital or acquired long QT syndromes.    []   [x]   Patient has received a Class 1 antiarrhythmic with less than 2 half-lives since last dose. (Disopyramide, Quinidine, Procainamide, Lidocaine, Mexiletine, Flecainide, Propafenone)    []   [x]   Patient has received amiodarone therapy in the past 3 months or amiodarone level is greater than 0.3 ng/ml.    Patient has been appropriately anticoagulated with apixaban.  Labs:    Component Value Date/Time   K 4.0 05/25/2020 1046   MG 2.2 05/25/2020 1046     Plan: Potassium: K >/= 4: Appropriate to initiate Tikosyn, no replacement needed    Magnesium: Mg >2: Appropriate to initiate Tikosyn, no replacement needed     Thank you for allowing pharmacy to participate in this patient's care   Hildred Laser, PharmD Clinical Pharmacist **Pharmacist phone directory can now be found on [email or web address] ([email or web address]).  Listed under [facility].

## 2020-05-26 DIAGNOSIS — I4819 Other persistent atrial fibrillation: Secondary | ICD-10-CM | POA: Diagnosis not present

## 2020-05-26 DIAGNOSIS — I484 Atypical atrial flutter: Secondary | ICD-10-CM

## 2020-05-26 LAB — BASIC METABOLIC PANEL
Anion gap: 8 (ref 5–15)
Anion gap: 8 (ref 5–15)
BUN: 17 mg/dL (ref 8–23)
BUN: 16 mg/dL (ref 8–23)
CO2: 24 mmol/L (ref 22–32)
CO2: 22 mmol/L (ref 22–32)
Calcium: 8.9 mg/dL (ref 8.9–10.3)
Calcium: 9.1 mg/dL (ref 8.9–10.3)
Chloride: 108 mmol/L (ref 98–111)
Chloride: 110 mmol/L (ref 98–111)
Creatinine, Ser: 0.68 mg/dL (ref 0.44–1.00)
Creatinine, Ser: 0.91 mg/dL (ref 0.44–1.00)
GFR, Estimated: >60 mL/min (ref >60)
GFR, Estimated: >60 mL/min (ref >60)
Glucose, Bld: 91 mg/dL (ref 70–99)
Glucose, Bld: 116 mg/dL — ABNORMAL HIGH (ref 70–99)
Potassium: 3.3 mmol/L — ABNORMAL LOW (ref 3.5–5.1)
Potassium: 4.5 mmol/L (ref 3.5–5.1)
Sodium: 140 mmol/L (ref 135–145)
Sodium: 140 mmol/L (ref 135–145)

## 2020-05-26 LAB — MAGNESIUM: Magnesium: 2.1 mg/dL (ref 1.7–2.4)

## 2020-05-26 MED ORDER — POTASSIUM CHLORIDE CRYS ER 20 MEQ PO TBCR
40.0000 meq | EXTENDED_RELEASE_TABLET | ORAL | Status: AC
  Administered 2020-05-26 (×2): 40 meq via ORAL
  Filled 2020-05-26 (×2): qty 2

## 2020-05-26 NOTE — Progress Notes (Addendum)
Progress Note  Patient Name: Hannah Roy Date of Encounter: 05/26/2020  [facility] Cardiologist: Rozann Lesches, MD   Subjective   No complaints  Inpatient Medications    Scheduled Meds: . amLODipine  5 mg Oral Daily  . apixaban  5 mg Oral BID  . dofetilide  250 mcg Oral BID  . metoprolol tartrate  100 mg Oral BID  . pantoprazole  40 mg Oral QAC breakfast  . potassium chloride  40 mEq Oral Q3H  . rosuvastatin  5 mg Oral Q M,W,F-2000  . senna-docusate  2 tablet Oral QHS  . sodium chloride flush  3 mL Intravenous Q12H   Continuous Infusions: . sodium chloride     PRN Meds: sodium chloride, acetaminophen, sodium chloride flush   Vital Signs    Vitals:   05/25/20 1324 05/25/20 2011 05/26/20 0351  BP: (!) 135/97 106/72 114/77  Pulse: 91 80 65  Resp: 18 18 20   Temp: 98 F (36.7 C) 98 F (36.7 C) 97.8 F (36.6 C)  TempSrc: Oral Oral Oral  SpO2: 99% 98% 96%  Weight: 54.6 kg  57.5 kg  Height: 5\' 1"  (1.549 m)      Intake/Output Summary (Last 24 hours) at 05/26/2020 0745 Last data filed at 05/25/2020 2015 Gross per 24 hour  Intake 240 ml  Output -  Net 240 ml   Last 3 Weights 05/26/2020 05/25/2020 05/25/2020  Weight (lbs) 126 lb 12.2 oz 120 lb 6.4 oz 120 lb 3.2 oz  Weight (kg) 57.5 kg 54.613 kg 54.522 kg      Telemetry    AFlutter, 80's generally- Personally Reviewed  ECG    Atypical AFlutter 68bpm, RBBB, reviewed by dr. [name], given RBBB, QTc is acceptable - Personally Reviewed  Physical Exam   GEN: No acute distress.   Neck: No JVD Cardiac: irreg-irreg, no murmurs, rubs, or gallops.  Respiratory: Clear to auscultation bilaterally. GI: Soft, nontender, non-distended  MS: No edema; No deformity. Neuro:  Nonfocal  Psych: Normal affect   Labs    High Sensitivity Troponin:  No results for input(s): TROPONINIHS in the last 720 hours.    Chemistry Recent Labs  Lab 05/25/20 1046 05/26/20 0214  NA 139 140  K 4.0 3.3*  CL 105 108  CO2 24 24   GLUCOSE 96 91  BUN 19 17  CREATININE 0.88 0.68  CALCIUM 8.9 8.9  GFRNONAA >60 >60  ANIONGAP 10 8     HematologyNo results for input(s): WBC, RBC, HGB, HCT, MCV, MCH, MCHC, RDW, PLT in the last 168 hours.  BNPNo results for input(s): BNP, PROBNP in the last 168 hours.   DDimer No results for input(s): DDIMER in the last 168 hours.   Radiology    No results found.  Cardiac Studies    01/30/20: TTE IMPRESSIONS  1. Left ventricular ejection fraction, by estimation, is 55 to 60%. The  left ventricle has normal function.  2. Right ventricular systolic function is mildly reduced. The right  ventricular size is normal.  3. LA appendage surgically clipped 09/05/2018. No residual appendage  noted.. Left atrial size was severely dilated. No left atrial/left atrial  appendage thrombus was detected.  4. Right atrial size was moderately dilated.  5. Normal mitral valve bioprosthesis. DVI 1.35, EOA 2.57 cm2, iEOA 1.67  cm2/m2, PHT 81 ms. The mitral valve has been repaired/replaced. Trivial  mitral valve regurgitation. The mean mitral valve gradient is 3.0 mmHg  with average heart rate of 75 bpm.  There  is a 29 mm Magna Ease bioprosthetic valve present in the mitral  position. Procedure Date: 09/05/2018.  6. The tricuspid valve is has been repaired/replaced. The tricuspid valve  is status post repair with an annuloplasty ring. Tricuspid valve  regurgitation is mild to moderate. Mean gradient 2 mmHg at HR 75 bpm.  Grossly normal appearance and function.  7. Normal aortic valve bioprosthesis. AT 60 ms, DVI 0.91, EOA 3.2 cm2,  iEOA 2.1 cm2/m2. The aortic valve has been repaired/replaced. Aortic valve  regurgitation is mild. There is a 21 mm Magna bioprosthetic valve present  in the aortic position. Procedure  Date: 09/05/2018. Aortic valve mean gradient measures 4.0 mmHg.  8. Aortic root/ascending aorta has been repaired/replaced and normal  appearance of ascending aorta repair.  There is Moderate (Grade III)  atheroma plaque involving the descending aorta.  9. Agitated saline contrast bubble study was negative, with no evidence  of any interatrial shunt.   Conclusion(s)/Recommendation(s): No residual left atrial appendage, no LA  mural thrombus. Successful cardioversion performed. Normal appearance of  aortic and mitral bioprosthesis, stable tricuspid valve repair, normal  apperance of ascending aorta graft.     Patient Profile     80 y.o. 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 Persistent Afib/atypical AFlutter     CHA2DS2Vasc is 4, on Eliquis, appropriately dosed     tikosyn initiation in progress     K+ 3.3 (replacement ordered)     Mag 2.1     QTc, EKG is reviewed by Dr. [name], OK to continue load, RBBB contributes  Planned for DCCV tomorrow if not converted to SR  2. VHD     Stable function by echo Oct 2021  3. HTN     No changes    For questions or updates, please contact [facility] Please consult [email or web address] for contact info under        Signed, Baldwin Jamaica, PA-C  05/26/2020, 7:45 AM     I have seen, examined the patient, and reviewed the above assessment and plan.  Changes to above are made where necessary.  On exam, iRRR.  Atrial fibrillation has organized into atypical atrial flutter.  Qt is stable.  Continue tikosyn load.  Co Sign: Thompson Grayer, MD 05/26/2020

## 2020-05-26 NOTE — Plan of Care (Signed)

## 2020-05-27 ENCOUNTER — Encounter (HOSPITAL_COMMUNITY)
Admission: RE | Disposition: A | Discharge status: Home / Self Care | Payer: Medicare Other | Source: Ambulatory Visit | Attending: Internal Medicine

## 2020-05-27 DIAGNOSIS — I4819 Other persistent atrial fibrillation: Secondary | ICD-10-CM | POA: Diagnosis not present

## 2020-05-27 LAB — BASIC METABOLIC PANEL
Anion gap: 7 (ref 5–15)
BUN: 18 mg/dL (ref 8–23)
CO2: 24 mmol/L (ref 22–32)
Calcium: 9.2 mg/dL (ref 8.9–10.3)
Chloride: 108 mmol/L (ref 98–111)
Creatinine, Ser: 0.78 mg/dL (ref 0.44–1.00)
GFR, Estimated: >60 mL/min (ref >60)
Glucose, Bld: 93 mg/dL (ref 70–99)
Potassium: 4.4 mmol/L (ref 3.5–5.1)
Sodium: 139 mmol/L (ref 135–145)

## 2020-05-27 LAB — MAGNESIUM: Magnesium: 2.1 mg/dL (ref 1.7–2.4)

## 2020-05-27 SURGERY — CARDIOVERSION
Anesthesia: General

## 2020-05-27 MED ORDER — METOPROLOL TARTRATE 50 MG PO TABS
50.0000 mg | ORAL_TABLET | Freq: Two times a day (BID) | ORAL | Status: DC
  Administered 2020-05-27 – 2020-05-28 (×2): 50 mg via ORAL
  Filled 2020-05-27 (×2): qty 1

## 2020-05-27 MED ORDER — BLISTEX MEDICATED EX OINT
TOPICAL_OINTMENT | CUTANEOUS | Status: DC | PRN
  Filled 2020-05-27: qty 6.3

## 2020-05-27 NOTE — Care Management (Addendum)
05-27-20 1000 Patient presented for Tikosyn Load. Case Manager discussed cost with patient and she felt it would be too expensive and wants to use Good [facility]. Pt will get her first fill from [facility] and please send refills 90 day supply to [facility], [address], [place], [place] [address]. Case Manager called this location and medication is in stock. Good Rx provided to patient. No further needs from Case Manager at this time. Bethena Roys, RN,BSN Case Manager

## 2020-05-27 NOTE — Discharge Summary (Addendum)
ELECTROPHYSIOLOGY PROCEDURE DISCHARGE SUMMARY    Patient ID: [name],  MRN: [id number], DOB/AGE: 1940-11-12 80 y.o.  Admit date: 05/25/2020 Discharge date: 05/28/2020  Primary Care Physician: Kathyrn Drown, MD  Primary Cardiologist: Dr. Domenic Polite Electrophysiologist: Dr. [name]  Primary Discharge Diagnosis:  1.  persistent atrial fibrillation/atypical atrial flutter (h/o MAZE and LAA clipping) status post Tikosyn loading this admission      CHA2DS2Vasc is 4, on Eliquis  Secondary Discharge Diagnosis:  1. Rheumatic VHD     H/o AVR/MVR/TV repair 2. Ascending aortic aneurysm     Repaired 3. HTN  Allergies  Allergen Reactions  . Bee Venom Swelling and Other (See Comments)    Welts, also  . Other Other (See Comments)    ANY PAIN MEDS MUST, MUST BE PRECEDED BY AN ANTIEMETIC!!!!   . Jaclyn Prime [Ibandronic Acid] Nausea And Vomiting and Other (See Comments)    Upsets the stomach and flu-like symptoms   . Lisinopril Cough  . Latex Rash     Procedures This Admission:  1.  Tikosyn loading   Brief HPI: Hannah Roy is a 80 y.o. female with a past medical history as noted above.  The patient is followed out patient by EP, options of atrial fibrillation/flutter were discussed.  Risks, benefits, and alternatives to Tikosyn were reviewed with the patient who wished to proceed.    Hospital Course:  The patient was admitted and Tikosyn was initiated.  Renal function and electrolytes were followed during the hospitalization.  The patient's QTc remained stable.  She converted with drug and did not required DCCV.  The patient was monitored until discharge on telemetry which demonstrated SB prompting reduction in her lopressor dose down to 25mg  BID.  With  On the day of discharge, she feels well, was examined by Dr  [name] who considered the patient stable for discharge to home.   She already has an appointment to see Dr. Domenic Polite next week on 2/22, I have ordered for BMET, Mag and EKG be  done that day as well, she will see the Afib clinic in 4 weeks.   The patient's initial fill was filled via TOC, she requested refills be sent to  [facility], [address], [place], [place] [address] With plans to utilized [facility]  Tikosyn teaching was completed electrolyte replacement for home, she required K+ replacement twice while here, will send her with small dose of replacement of 12meq daily  Physical Exam: Vitals:   05/27/20 2014 05/27/20 2157 05/28/20 0314 05/28/20 0815  BP: 102/68 103/64 105/70   Pulse:  82 (!) 58 67  Resp: 16 15 17 17   Temp: 97.9 F (36.6 C) 98.1 F (36.7 C) 98.6 F (37 C) 98.4 F (36.9 C)  TempSrc: Oral Oral Oral Oral  SpO2:   99% 100%  Weight:   53.7 kg   Height:         GEN- The patient is well appearing, alert and oriented x 3 today.   HEENT: normocephalic, atraumatic; sclera clear, conjunctiva pink; hearing intact; oropharynx clear; neck supple, no JVP Lymph- no cervical lymphadenopathy Lungs- CTA b/l, normal work of breathing.  No wheezes, rales, rhonchi Heart- RRR, no murmurs, rubs or gallops, PMI not laterally displaced GI- soft, non-tender, non-distended Extremities- no clubbing, cyanosis, or edema MS- no significant deformity or atrophy Skin- warm and dry, no rash or lesion Psych- euthymic mood, full affect Neuro- strength and sensation are intact   Labs:   Lab  Results  Component Value Date   WBC 6.2 05/10/2020   HGB 13.1 05/10/2020   HCT 39.3 05/10/2020   MCV 97 05/10/2020   PLT 177 05/10/2020    Recent Labs  Lab 05/28/20 0131  NA 139  K 3.6  CL 106  CO2 24  BUN 20  CREATININE 0.82  CALCIUM 9.3  GLUCOSE 98     Discharge Medications:  Allergies as of 05/28/2020      Reactions   Bee Venom Swelling, Other (See Comments)   Welts, also   Other Other (See Comments)   ANY PAIN MEDS MUST, MUST BE PRECEDED BY AN ANTIEMETIC!!!!    Boniva [ibandronic Acid] Nausea And Vomiting, Other  (See Comments)   Upsets the stomach and flu-like symptoms    Lisinopril Cough   Latex Rash      Medication List    TAKE these medications   acetaminophen 325 MG tablet Commonly known as: TYLENOL Take 1-2 tablets (325-650 mg total) by mouth every 4 (four) hours as needed for mild pain. What changed:   how much to take  reasons to take this   amLODipine 5 MG tablet Commonly known as: NORVASC Take 1 tablet (5 mg total) by mouth daily.   amoxicillin 500 MG tablet Commonly known as: AMOXIL Take 4 Tablets 30-60 minutes prior to dental cleaning / procedures. What changed:   how much to take  how to take this  when to take this  reasons to take this  additional instructions   apixaban 5 MG Tabs tablet Commonly known as: Eliquis Take 1 tablet (5 mg total) by mouth 2 (two) times daily.   ascorbic acid 500 MG tablet Commonly known as: VITAMIN C Take 1 tablet (500 mg total) by mouth 2 (two) times daily.   dofetilide 250 MCG capsule Commonly known as: TIKOSYN Take 1 capsule (250 mcg total) by mouth 2 (two) times daily.   metoprolol tartrate 25 MG tablet Commonly known as: LOPRESSOR Take 1 tablet (25 mg total) by mouth 2 (two) times daily. What changed:   medication strength  how much to take   pantoprazole 40 MG tablet Commonly known as: PROTONIX Take 1 tablet (40 mg total) by mouth daily before breakfast.   potassium chloride 10 MEQ tablet Commonly known as: KLOR-CON Take 1 tablet (10 mEq total) by mouth daily.   rosuvastatin 5 MG tablet Commonly known as: CRESTOR TAKE 1 TABLET BY MOUTH ON MONDAY, WEDNESDAY AND FRIDAY What changed: See the new instructions.   senna-docusate 8.6-50 MG tablet Commonly known as: Senokot-S Take 2 tablets by mouth at bedtime. What changed: how much to take       Disposition: Home Discharge Instructions    Diet - low sodium heart healthy   Complete by: As directed    Increase activity slowly   Complete by: As  directed       Follow-up Information    Satira Sark, MD Follow up.   Specialty: Cardiology Why: 06/01/20 @ 2:00PM Contact information: [place] [address] [phone]        [facility] Follow up.   Specialty: Cardiology Why: 07/01/20 @ 11:00AM Contact information: [address] [id number] [place] [phone]              Duration of Discharge Encounter: Greater than 30 minutes including physician time.  Venetia Night, PA-C 05/28/2020 12:26 PM

## 2020-05-27 NOTE — Progress Notes (Addendum)
Progress Note  Patient Name: Hannah Roy Date of Encounter: 05/27/2020  [facility] Cardiologist: Rozann Lesches, MD   Subjective   No complaints  Inpatient Medications    Scheduled Meds: . amLODipine  5 mg Oral Daily  . apixaban  5 mg Oral BID  . dofetilide  250 mcg Oral BID  . metoprolol tartrate  100 mg Oral BID  . pantoprazole  40 mg Oral QAC breakfast  . rosuvastatin  5 mg Oral Q M,W,F-2000  . senna-docusate  2 tablet Oral QHS  . sodium chloride flush  3 mL Intravenous Q12H   Continuous Infusions: . sodium chloride     PRN Meds: sodium chloride, acetaminophen, sodium chloride flush   Vital Signs    Vitals:   05/26/20 1415 05/26/20 2254 05/27/20 0628 05/27/20 0814  BP: 97/69 101/64 110/70 (!) 102/93  Pulse: (!) 57 60  62  Resp: 18 16 16 16   Temp: 98.4 F (36.9 C) 98.2 F (36.8 C) 97.9 F (36.6 C) 98.2 F (36.8 C)  TempSrc: Oral Oral Oral Oral  SpO2: 96% 98%    Weight:   57.3 kg   Height:        Intake/Output Summary (Last 24 hours) at 05/27/2020 0824 Last data filed at 05/26/2020 2030 Gross per 24 hour  Intake 120 ml  Output -  Net 120 ml   Last 3 Weights 05/27/2020 05/26/2020 05/25/2020  Weight (lbs) 126 lb 5.2 oz 126 lb 12.2 oz 120 lb 6.4 oz  Weight (kg) 57.3 kg 57.5 kg 54.613 kg      Telemetry    SR 50's-60s, had a brief asymptomatic post conversion pause, nocturnal rates 48-50's- Personally Reviewed  ECG    SB 57, RBBB, manually measured QT 500-558ms, give QRS 160ms, QTc remains acceptable  - Personally Reviewed  Physical Exam   GEN: No acute distress.   Neck: No JVD Cardiac: RRR, no murmurs, rubs, or gallops.  Respiratory: CTA b/l. GI: Soft, nontender, non-distended  MS: No edema; No deformity. Neuro:  Nonfocal  Psych: Normal affect   Labs    High Sensitivity Troponin:  No results for input(s): TROPONINIHS in the last 720 hours.    Chemistry Recent Labs  Lab 05/26/20 0214 05/26/20 1333 05/27/20 0231  NA 140 140 139  K  3.3* 4.5 4.4  CL 108 110 108  CO2 24 22 24   GLUCOSE 91 116* 93  BUN 17 16 18   CREATININE 0.68 0.91 0.78  CALCIUM 8.9 9.1 9.2  GFRNONAA >60 >60 >60  ANIONGAP 8 8 7      HematologyNo results for input(s): WBC, RBC, HGB, HCT, MCV, MCH, MCHC, RDW, PLT in the last 168 hours.  BNPNo results for input(s): BNP, PROBNP in the last 168 hours.   DDimer No results for input(s): DDIMER in the last 168 hours.   Radiology    No results found.  Cardiac Studies    01/30/20: TTE IMPRESSIONS  1. Left ventricular ejection fraction, by estimation, is 55 to 60%. The  left ventricle has normal function.  2. Right ventricular systolic function is mildly reduced. The right  ventricular size is normal.  3. LA appendage surgically clipped 09/05/2018. No residual appendage  noted.. Left atrial size was severely dilated. No left atrial/left atrial  appendage thrombus was detected.  4. Right atrial size was moderately dilated.  5. Normal mitral valve bioprosthesis. DVI 1.35, EOA 2.57 cm2, iEOA 1.67  cm2/m2, PHT 81 ms. The mitral valve has been repaired/replaced. Trivial  mitral valve regurgitation. The mean mitral valve gradient is 3.0 mmHg  with average heart rate of 75 bpm.  There is a 29 mm Magna Ease bioprosthetic valve present in the mitral  position. Procedure Date: 09/05/2018.  6. The tricuspid valve is has been repaired/replaced. The tricuspid valve  is status post repair with an annuloplasty ring. Tricuspid valve  regurgitation is mild to moderate. Mean gradient 2 mmHg at HR 75 bpm.  Grossly normal appearance and function.  7. Normal aortic valve bioprosthesis. AT 60 ms, DVI 0.91, EOA 3.2 cm2,  iEOA 2.1 cm2/m2. The aortic valve has been repaired/replaced. Aortic valve  regurgitation is mild. There is a 21 mm Magna bioprosthetic valve present  in the aortic position. Procedure  Date: 09/05/2018. Aortic valve mean gradient measures 4.0 mmHg.  8. Aortic root/ascending aorta has been  repaired/replaced and normal  appearance of ascending aorta repair. There is Moderate (Grade III)  atheroma plaque involving the descending aorta.  9. Agitated saline contrast bubble study was negative, with no evidence  of any interatrial shunt.   Conclusion(s)/Recommendation(s): No residual left atrial appendage, no LA  mural thrombus. Successful cardioversion performed. Normal appearance of  aortic and mitral bioprosthesis, stable tricuspid valve repair, normal  apperance of ascending aorta graft.     Patient Profile     80 y.o. 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 Persistent Afib/atypical AFlutter     CHA2DS2Vasc is 4, on Eliquis, appropriately dosed     tikosyn initiation in progress     K+ 4.4     Mag 2.1     Creat 0.78     QTc stable  Anticipate d/c tomorrow  2. VHD     Stable function by echo Oct 2021  3. HTN     No changes    For questions or updates, please contact [facility] Please consult [email or web address] for contact info under        Signed, Baldwin Jamaica, PA-C  05/27/2020, 8:24 AM    I have seen, examined the patient, and reviewed the above assessment and plan.  Changes to above are made where necessary.  On exam, RRR.  She has converted to sinus rhythm.  Qt is stable.  Continue tikosyn load.  Hopefully to discharge tomorrow.  Co Sign: Thompson Grayer, MD 05/27/2020 9:53 PM

## 2020-05-28 ENCOUNTER — Other Ambulatory Visit: Payer: Self-pay | Admitting: Physician Assistant

## 2020-05-28 DIAGNOSIS — I4819 Other persistent atrial fibrillation: Secondary | ICD-10-CM | POA: Diagnosis not present

## 2020-05-28 DIAGNOSIS — Z79899 Other long term (current) drug therapy: Secondary | ICD-10-CM

## 2020-05-28 LAB — BASIC METABOLIC PANEL
Anion gap: 9 (ref 5–15)
BUN: 20 mg/dL (ref 8–23)
CO2: 24 mmol/L (ref 22–32)
Calcium: 9.3 mg/dL (ref 8.9–10.3)
Chloride: 106 mmol/L (ref 98–111)
Creatinine, Ser: 0.82 mg/dL (ref 0.44–1.00)
GFR, Estimated: >60 mL/min (ref >60)
Glucose, Bld: 98 mg/dL (ref 70–99)
Potassium: 3.6 mmol/L (ref 3.5–5.1)
Sodium: 139 mmol/L (ref 135–145)

## 2020-05-28 LAB — MAGNESIUM: Magnesium: 2.1 mg/dL (ref 1.7–2.4)

## 2020-05-28 MED ORDER — DOFETILIDE 250 MCG PO CAPS: 250.0000 ug | ORAL_CAPSULE | Freq: Two times a day (BID) | ORAL | 0 refills | Status: DC

## 2020-05-28 MED ORDER — METOPROLOL TARTRATE 25 MG PO TABS: 25.0000 mg | ORAL_TABLET | Freq: Two times a day (BID) | ORAL | 6 refills | Status: DC

## 2020-05-28 MED ORDER — DOFETILIDE 250 MCG PO CAPS: 250.0000 ug | ORAL_CAPSULE | Freq: Two times a day (BID) | ORAL | 2 refills | Status: DC

## 2020-05-28 MED ORDER — POTASSIUM CHLORIDE CRYS ER 20 MEQ PO TBCR
40.0000 meq | EXTENDED_RELEASE_TABLET | Freq: Once | ORAL | Status: AC
  Administered 2020-05-28: 40 meq via ORAL
  Filled 2020-05-28: qty 2

## 2020-05-28 MED ORDER — POTASSIUM CHLORIDE ER 10 MEQ PO TBCR: 10.0000 meq | EXTENDED_RELEASE_TABLET | Freq: Every day | ORAL | 6 refills | Status: DC

## 2020-05-28 MED FILL — DOFETILIDE 250 MCG CAPS: 250 | 30 days supply | Qty: 60 | Fill #0

## 2020-05-28 NOTE — Discharge Instructions (Signed)

## 2020-05-28 NOTE — Progress Notes (Signed)
Telemetry reviewed, remains SB/SR, occ PAC QTc stable K+ replaced, may need for home. Anticipate discharge this afternoon.  Andi Hence, PA-C

## 2020-05-28 NOTE — Care Management Important Message (Signed)
Important Message  Patient Details  Name: Hannah Roy MRN: [id number] Date of Birth: [birth year]   Medicare Important Message Given:  Yes     Shelda Altes 05/28/2020, 9:12 AM

## 2020-05-28 NOTE — Progress Notes (Signed)
.   Pharmacy: Dofetilide (Tikosyn) - Follow Up Assessment and Electrolyte Replacement  Pharmacy consulted to assist in monitoring and replacing electrolytes in this 80 y.o. female admitted on 05/25/2020 undergoing dofetilide initiation.   Labs:    Component Value Date/Time   K 3.6 05/28/2020 0131   MG 2.1 05/28/2020 0131     Plan: Potassium: -kdur 65meq ordered per MD  Magnesium: Mg > 2: No additional supplementation needed   As patient has required on average 40 mEq of potassium replacement every day, recommend discharging patient with prescription for:  Potassium chloride 59mEq  daily  Thank you for allowing pharmacy to participate in this patient's care   Hildred Laser, PharmD Clinical Pharmacist **Pharmacist phone directory can now be found on [email or web address] ([email or web address]).  Listed under [place].

## 2020-05-28 NOTE — Plan of Care (Signed)

## 2020-05-31 ENCOUNTER — Telehealth: Payer: Self-pay | Admitting: *Deleted

## 2020-05-31 NOTE — Telephone Encounter (Signed)
Transition Care Management Follow-up Telephone Call  Date of discharge and from where: 05/28/2020 - [facility]  How have you been since you were released from the hospital? "Doing fine"  Any questions or concerns? No  Items Reviewed:  Did the pt receive and understand the discharge instructions provided? Yes   Medications obtained and verified? Yes   Other? N/A  Any new allergies since your discharge? No   Dietary orders reviewed? Yes  Do you have support at home? Yes   Home Care and Equipment/Supplies: Were home health services ordered? not applicable If so, what is the name of the agency? N/A  Has the agency set up a time to come to the patient's home? not applicable Were any new equipment or medical supplies ordered?  No What is the name of the medical supply agency? N/A Were you able to get the supplies/equipment? not applicable Do you have any questions related to the use of the equipment or supplies? No  Functional Questionnaire: (I = Independent and D = Dependent) ADLs: I  Bathing/Dressing- I  Meal Prep- I  Eating- I  Maintaining continence- I  Transferring/Ambulation- I  Managing Meds- I  Follow up appointments reviewed:   PCP Hospital f/u appt confirmed? Yes  Scheduled to see Dr. Wolfgang Phoenix on 06/23/20 @ 1000.  [facility] f/u appt confirmed? Yes  Scheduled to see Dr. Domenic Polite with Cardiology on 06/01/20 @ 1400.  Are transportation arrangements needed? No   If their condition worsens, is the pt aware to call PCP or go to the Emergency Dept.? Yes  Was the patient provided with contact information for the PCP's office or ED? Yes  Was to pt encouraged to call back with questions or concerns? Yes

## 2020-06-01 ENCOUNTER — Encounter: Payer: Self-pay | Admitting: Cardiology

## 2020-06-01 ENCOUNTER — Ambulatory Visit (INDEPENDENT_AMBULATORY_CARE_PROVIDER_SITE_OTHER): Payer: Medicare Other | Admitting: Cardiology

## 2020-06-01 ENCOUNTER — Telehealth: Payer: Self-pay

## 2020-06-01 ENCOUNTER — Other Ambulatory Visit: Payer: Self-pay

## 2020-06-01 ENCOUNTER — Other Ambulatory Visit (HOSPITAL_COMMUNITY)
Admission: RE | Admit: 2020-06-01 | Discharge: 2020-06-01 | Disposition: A | Discharge status: Home / Self Care | Payer: Medicare Other | Source: Ambulatory Visit | Attending: Cardiology | Admitting: Cardiology

## 2020-06-01 VITALS — BP 106/70 | HR 78 | Ht 61.0 in | Wt 121.0 lb

## 2020-06-01 DIAGNOSIS — I10 Essential (primary) hypertension: Secondary | ICD-10-CM | POA: Diagnosis not present

## 2020-06-01 DIAGNOSIS — Z953 Presence of xenogenic heart valve: Secondary | ICD-10-CM | POA: Diagnosis not present

## 2020-06-01 DIAGNOSIS — I4892 Unspecified atrial flutter: Secondary | ICD-10-CM | POA: Diagnosis not present

## 2020-06-01 DIAGNOSIS — S065X9A Traumatic subdural hemorrhage with loss of consciousness of unspecified duration, initial encounter: Secondary | ICD-10-CM | POA: Diagnosis not present

## 2020-06-01 DIAGNOSIS — I4891 Unspecified atrial fibrillation: Secondary | ICD-10-CM

## 2020-06-01 DIAGNOSIS — S129XXD Fracture of neck, unspecified, subsequent encounter: Secondary | ICD-10-CM | POA: Diagnosis not present

## 2020-06-01 DIAGNOSIS — Z79899 Other long term (current) drug therapy: Secondary | ICD-10-CM

## 2020-06-01 DIAGNOSIS — M532X1 Spinal instabilities, occipito-atlanto-axial region: Secondary | ICD-10-CM | POA: Diagnosis not present

## 2020-06-01 LAB — BASIC METABOLIC PANEL
Anion gap: 6 (ref 5–15)
BUN: 16 mg/dL (ref 8–23)
CO2: 26 mmol/L (ref 22–32)
Calcium: 9 mg/dL (ref 8.9–10.3)
Chloride: 106 mmol/L (ref 98–111)
Creatinine, Ser: 0.66 mg/dL (ref 0.44–1.00)
GFR, Estimated: >60 mL/min (ref >60)
Glucose, Bld: 98 mg/dL (ref 70–99)
Potassium: 3.6 mmol/L (ref 3.5–5.1)
Sodium: 138 mmol/L (ref 135–145)

## 2020-06-01 LAB — MAGNESIUM: Magnesium: 2.1 mg/dL (ref 1.7–2.4)

## 2020-06-01 MED ORDER — POTASSIUM CHLORIDE ER 10 MEQ PO TBCR: 20.0000 meq | EXTENDED_RELEASE_TABLET | Freq: Every day | ORAL | 3 refills | Status: DC

## 2020-06-01 NOTE — Telephone Encounter (Signed)
Lab results discussed with patient.She will increase potassium to 20 meq daily and repeat bmet in 2 weeks.

## 2020-06-01 NOTE — Telephone Encounter (Signed)
-----   Message from Satira Sark, MD sent at 06/01/2020  3:31 PM EST ----- Results reviewed.  Magnesium remains normal at 2.1.  Potassium was also normal, but low end of scale at 3.6.  Let's have her increase KCl to 20 mEq daily.  Recheck BMET in 2 weeks.
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
## 2020-06-01 NOTE — Patient Instructions (Signed)
Medication Instructions:  Your physician recommends that you continue on your current medications as directed. Please refer to the Current Medication list given to you today.  *If you need a refill on your cardiac medications before your next appointment, please call your pharmacy*   Lab Work: bmet andmagnesium today If you have labs (blood work) drawn today and your tests are completely normal, you will receive your results only by: [name] MyChart Message (if you have MyChart) OR . A paper copy in the mail If you have any lab test that is abnormal or we need to change your treatment, we will call you to review the results.   Testing/Procedures: None today   Follow-Up: At [facility], you and your health needs are our priority.  As part of our continuing mission to provide you with exceptional heart care, we have created designated Provider Care Teams.  These Care Teams include your primary Cardiologist (physician) and Advanced Practice Providers (APPs -  Physician Assistants and Nurse Practitioners) who all work together to provide you with the care you need, when you need it.  We recommend signing up for the patient portal called "MyChart".  Sign up information is provided on this After Visit Summary.  MyChart is used to connect with patients for Virtual Visits (Telemedicine).  Patients are able to view lab/test results, encounter notes, upcoming appointments, etc.  Non-urgent messages can be sent to your provider as well.   To learn more about what you can do with MyChart, go to [email or web address].    Your next appointment:   3 month(s)  The format for your next appointment:   In Person  Provider:   Rozann Lesches, MD   Other Instructions None      Thank you for choosing [facility] !

## 2020-06-04 ENCOUNTER — Ambulatory Visit (HOSPITAL_COMMUNITY): Payer: Medicare Other | Admitting: Nurse Practitioner

## 2020-06-14 DIAGNOSIS — H538 Other visual disturbances: Secondary | ICD-10-CM | POA: Diagnosis not present

## 2020-06-14 DIAGNOSIS — S06890A Other specified intracranial injury without loss of consciousness, initial encounter: Secondary | ICD-10-CM | POA: Diagnosis not present

## 2020-06-14 DIAGNOSIS — H26493 Other secondary cataract, bilateral: Secondary | ICD-10-CM | POA: Diagnosis not present

## 2020-06-15 ENCOUNTER — Telehealth: Payer: Self-pay

## 2020-06-15 ENCOUNTER — Other Ambulatory Visit (HOSPITAL_COMMUNITY)
Admission: RE | Admit: 2020-06-15 | Discharge: 2020-06-15 | Disposition: A | Discharge status: Home / Self Care | Payer: Medicare Other | Source: Ambulatory Visit | Attending: Cardiology | Admitting: Cardiology

## 2020-06-15 ENCOUNTER — Other Ambulatory Visit: Payer: Self-pay

## 2020-06-15 DIAGNOSIS — Z79899 Other long term (current) drug therapy: Secondary | ICD-10-CM | POA: Insufficient documentation

## 2020-06-15 LAB — BASIC METABOLIC PANEL
Anion gap: 9 (ref 5–15)
BUN: 17 mg/dL (ref 8–23)
CO2: 25 mmol/L (ref 22–32)
Calcium: 8.9 mg/dL (ref 8.9–10.3)
Chloride: 103 mmol/L (ref 98–111)
Creatinine, Ser: 0.6 mg/dL (ref 0.44–1.00)
GFR, Estimated: >60 mL/min (ref >60)
Glucose, Bld: 94 mg/dL (ref 70–99)
Potassium: 4.2 mmol/L (ref 3.5–5.1)
Sodium: 137 mmol/L (ref 135–145)

## 2020-06-15 NOTE — Telephone Encounter (Signed)
Patient verbalized understanding of results  

## 2020-06-15 NOTE — Telephone Encounter (Signed)
-----   Message from Satira Sark, MD sent at 06/15/2020 11:31 AM EST ----- Results reviewed.  Renal function and potassium levels are normal.

## 2020-06-23 ENCOUNTER — Other Ambulatory Visit: Payer: Self-pay

## 2020-06-23 ENCOUNTER — Ambulatory Visit: Payer: Medicare Other | Admitting: Internal Medicine

## 2020-06-23 ENCOUNTER — Ambulatory Visit (INDEPENDENT_AMBULATORY_CARE_PROVIDER_SITE_OTHER): Payer: Medicare Other | Admitting: Family Medicine

## 2020-06-23 ENCOUNTER — Encounter: Payer: Self-pay | Admitting: Family Medicine

## 2020-06-23 VITALS — BP 122/74 | HR 64 | Temp 97.6°F | Wt 121.8 lb

## 2020-06-23 DIAGNOSIS — E7849 Other hyperlipidemia: Secondary | ICD-10-CM | POA: Diagnosis not present

## 2020-06-23 MED ORDER — PANTOPRAZOLE SODIUM 40 MG PO TBEC: 40.0000 mg | DELAYED_RELEASE_TABLET | Freq: Every day | ORAL | 1 refills | Status: DC

## 2020-06-23 NOTE — Progress Notes (Addendum)
   Subjective:    Patient ID: Hannah Roy, female    DOB: [birth year], 80 y.o.   MRN: [id number]  HPI Pt here for 6 month follow up. Pt states she is doing well. Pt did have procedure on 05/25/20 and has seen cardiology to follow up on Afib. Pt has started to take Tikosyn and is doing well.  Pt is wanting to know if she should continue Protonix.   Patient denies any major issues or problems Takes the medication on a regular basis Tolerating the new medicine so far No heart arrhythmia issues recently  Review of Systems  Constitutional: Negative for activity change and appetite change.  HENT: Negative for congestion and rhinorrhea.   Respiratory: Negative for cough and shortness of breath.   Cardiovascular: Negative for chest pain and leg swelling.  Gastrointestinal: Negative for abdominal pain, nausea and vomiting.  Skin: Negative for color change.  Neurological: Negative for dizziness and weakness.  Psychiatric/Behavioral: Negative for agitation and confusion.       Objective:   Physical Exam Vitals reviewed.  Constitutional:      General: She is not in acute distress. HENT:     Head: Normocephalic.  Cardiovascular:     Rate and Rhythm: Normal rate and regular rhythm.     Heart sounds: Normal heart sounds. No murmur heard.   Pulmonary:     Effort: Pulmonary effort is normal.     Breath sounds: Normal breath sounds.  Lymphadenopathy:     Cervical: No cervical adenopathy.  Neurological:     Mental Status: She is alert.  Psychiatric:        Behavior: Behavior normal.           Assessment & Plan:  Patient has upcoming cataract surgery we will touch base with his cardiologist to see if this needs to alter his her Eliquis I do not think so.  We will let the patient know what we find out  Heart rhythm doing good currently no decompensation noted currently  Follow-up again in 4 months sooner problems  Cardiology states that they do not think she has to stop her  medicine to get cataract surgery They also recommended that the patient notifies her eye doctor that she is on Eliquis to see if they have any concerns.  Our staff will connect with the patient to relay this to her.

## 2020-06-24 NOTE — Progress Notes (Signed)
Attempted to contact patient. No answering service available.

## 2020-06-28 ENCOUNTER — Other Ambulatory Visit: Payer: Self-pay | Admitting: Cardiology

## 2020-07-01 ENCOUNTER — Other Ambulatory Visit: Payer: Self-pay

## 2020-07-01 ENCOUNTER — Encounter (HOSPITAL_COMMUNITY): Payer: Self-pay | Admitting: Nurse Practitioner

## 2020-07-01 ENCOUNTER — Ambulatory Visit (HOSPITAL_COMMUNITY)
Admission: RE | Admit: 2020-07-01 | Discharge: 2020-07-01 | Disposition: A | Discharge status: Home / Self Care | Payer: Medicare Other | Source: Ambulatory Visit | Attending: Nurse Practitioner | Admitting: Nurse Practitioner

## 2020-07-01 VITALS — BP 122/84 | HR 63 | Ht 61.0 in | Wt 123.0 lb

## 2020-07-01 DIAGNOSIS — I4891 Unspecified atrial fibrillation: Secondary | ICD-10-CM | POA: Diagnosis not present

## 2020-07-01 DIAGNOSIS — I099 Rheumatic heart disease, unspecified: Secondary | ICD-10-CM | POA: Insufficient documentation

## 2020-07-01 DIAGNOSIS — I4510 Unspecified right bundle-branch block: Secondary | ICD-10-CM | POA: Diagnosis not present

## 2020-07-01 DIAGNOSIS — Z87820 Personal history of traumatic brain injury: Secondary | ICD-10-CM | POA: Insufficient documentation

## 2020-07-01 DIAGNOSIS — E039 Hypothyroidism, unspecified: Secondary | ICD-10-CM | POA: Insufficient documentation

## 2020-07-01 DIAGNOSIS — I2510 Atherosclerotic heart disease of native coronary artery without angina pectoris: Secondary | ICD-10-CM | POA: Diagnosis not present

## 2020-07-01 DIAGNOSIS — Z87891 Personal history of nicotine dependence: Secondary | ICD-10-CM | POA: Insufficient documentation

## 2020-07-01 DIAGNOSIS — D6869 Other thrombophilia: Secondary | ICD-10-CM | POA: Diagnosis not present

## 2020-07-01 DIAGNOSIS — Z952 Presence of prosthetic heart valve: Secondary | ICD-10-CM | POA: Diagnosis not present

## 2020-07-01 DIAGNOSIS — I4892 Unspecified atrial flutter: Secondary | ICD-10-CM | POA: Diagnosis not present

## 2020-07-01 DIAGNOSIS — I480 Paroxysmal atrial fibrillation: Secondary | ICD-10-CM | POA: Diagnosis not present

## 2020-07-01 DIAGNOSIS — Z7901 Long term (current) use of anticoagulants: Secondary | ICD-10-CM | POA: Diagnosis not present

## 2020-07-01 DIAGNOSIS — I10 Essential (primary) hypertension: Secondary | ICD-10-CM | POA: Insufficient documentation

## 2020-07-01 DIAGNOSIS — Z79899 Other long term (current) drug therapy: Secondary | ICD-10-CM | POA: Diagnosis not present

## 2020-07-01 LAB — MAGNESIUM: Magnesium: 2.3 mg/dL (ref 1.7–2.4)
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
## 2020-07-07 ENCOUNTER — Encounter: Payer: Medicare Other | Attending: Physical Medicine & Rehabilitation | Admitting: Physical Medicine & Rehabilitation

## 2020-07-07 ENCOUNTER — Other Ambulatory Visit: Payer: Self-pay

## 2020-07-07 ENCOUNTER — Encounter: Payer: Self-pay | Admitting: Physical Medicine & Rehabilitation

## 2020-07-07 VITALS — BP 134/76 | HR 60 | Temp 98.1°F | Ht 61.0 in | Wt 122.0 lb

## 2020-07-07 DIAGNOSIS — S069X1S Unspecified intracranial injury with loss of consciousness of 30 minutes or less, sequela: Secondary | ICD-10-CM | POA: Diagnosis not present

## 2020-07-07 NOTE — Patient Instructions (Addendum)
PLEASE FEEL FREE TO CALL OUR OFFICE WITH ANY PROBLEMS OR QUESTIONS ([phone])   MELATONIN 3MG  -10MG  AT BEDTIME  CLARITIN FOR ALLERGIES (NO "D") JUST REGULAR FORM   RETURN TO DRIVING PLAN:  WITH THE SUPERVISION OF A LICENSED DRIVER, PLEASE DRIVE IN AN EMPTY PARKING LOT FOR AT LEAST 2-3 TRIALS TO TEST REACTION TIME, VISION, USE OF EQUIPMENT IN CAR, ETC.  IF SUCCESSFUL WITH THE PARKING LOT DRIVING, PROCEED TO SUPERVISED DRIVING TRIALS IN YOUR NEIGHBORHOOD STREETS AT LOW TRAFFIC TIMES TO TEST OBSERVATION TO TRAFFIC SIGNALS, REACTION TIME, ETC. PLEASE ATTEMPT AT LEAST 2-3 TRIALS IN YOUR NEIGHBORHOOD.  IF NEIGHBORHOOD DRIVING IS SUCCESSFUL, YOU MAY PROCEED TO DRIVING IN BUSIER AREAS IN YOUR COMMUNITY WITH SUPERVISION OF A LICENSED DRIVER. PLEASE ATTEMPT AT LEAST 4-5 TRIALS.  IF COMMUNITY DRIVING IS SUCCESSFUL, YOU MAY PROCEED TO DRIVING ALONE, DURING THE DAY TIME, IN NON-PEAK TRAFFIC TIMES. YOU SHOULD DRIVE NO FURTHER THAN 30 MINUTES IN ONE DIRECTION. PLEASE DO NOT DRIVE IF YOU FEEL FATIGUED OR UNDER THE INFLUENCE OF MEDICATION.
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
## 2020-07-16 DIAGNOSIS — H43391 Other vitreous opacities, right eye: Secondary | ICD-10-CM | POA: Diagnosis not present

## 2020-07-16 DIAGNOSIS — H18413 Arcus senilis, bilateral: Secondary | ICD-10-CM | POA: Diagnosis not present

## 2020-07-16 DIAGNOSIS — Z961 Presence of intraocular lens: Secondary | ICD-10-CM | POA: Diagnosis not present

## 2020-07-16 DIAGNOSIS — H26492 Other secondary cataract, left eye: Secondary | ICD-10-CM | POA: Diagnosis not present

## 2020-07-22 DIAGNOSIS — H26491 Other secondary cataract, right eye: Secondary | ICD-10-CM | POA: Diagnosis not present

## 2020-07-29 ENCOUNTER — Telehealth: Payer: Self-pay | Admitting: Cardiology

## 2020-07-29 MED ORDER — METOPROLOL TARTRATE 25 MG PO TABS: ORAL_TABLET | ORAL | 11 refills | Status: DC

## 2020-07-29 NOTE — Telephone Encounter (Signed)
Medication refill request for Metoprolol Tartrate 25 mg tablets approved and sent to [facility].

## 2020-07-29 NOTE — Telephone Encounter (Signed)
New message     *STAT* If patient is at the pharmacy, call can be transferred to refill team.   1. Which medications need to be refilled? (please list name of each medication and dose if known) metoprolol tartrate (LOPRESSOR) 25 MG tablet  2. Which pharmacy/location (including street and city if local pharmacy) is medication to be sent to? [facility]  3. Do they need a 30 day or 90 day supply?  Hannah Roy

## 2020-08-02 ENCOUNTER — Ambulatory Visit: Payer: Medicare Other | Admitting: Family Medicine

## 2020-08-05 ENCOUNTER — Telehealth (HOSPITAL_COMMUNITY): Payer: Self-pay | Admitting: *Deleted

## 2020-08-05 MED ORDER — AMLODIPINE BESYLATE 5 MG PO TABS: 2.5000 mg | ORAL_TABLET | Freq: Every day | ORAL | 1 refills | Status: DC

## 2020-08-05 NOTE — Telephone Encounter (Signed)
Patient called in stating her dizziness continues BP running 96-102/65-68 HR 59-68.  Discussed with Adline Peals PA will decrease amlodipine to 2.5mg  once a day - call in 1 week with update .

## 2020-08-18 ENCOUNTER — Other Ambulatory Visit: Payer: Self-pay | Admitting: Family Medicine

## 2020-09-13 ENCOUNTER — Encounter: Payer: Medicare Other | Admitting: Thoracic Surgery (Cardiothoracic Vascular Surgery)
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
## 2020-09-14 ENCOUNTER — Other Ambulatory Visit: Payer: Self-pay

## 2020-09-14 ENCOUNTER — Ambulatory Visit (INDEPENDENT_AMBULATORY_CARE_PROVIDER_SITE_OTHER): Payer: Medicare Other

## 2020-09-14 DIAGNOSIS — Z0000 Encounter for general adult medical examination without abnormal findings: Secondary | ICD-10-CM

## 2020-09-14 NOTE — Patient Instructions (Signed)
Hannah Roy , Thank you for taking time to come for your Medicare Wellness Visit. I appreciate your ongoing commitment to your health goals. Please review the following plan we discussed and let me know if I can assist you in the future.   Screening recommendations/referrals: Colonoscopy: Up to date, next due 11/09/2021 Mammogram: Up to date next due 01/15/2021 Bone Density: Up to date, next due 02/01/2022 Recommended yearly ophthalmology/optometry visit for glaucoma screening and checkup Recommended yearly dental visit for hygiene and checkup  Vaccinations: Influenza vaccine: Up to date, next due fall 2022  Pneumococcal vaccine: Completed series  Tdap vaccine: currently due, you may await and injury to receive  Shingles vaccine: Completed series.    Advanced directives: Please bring in a copy of your advanced medical directives so that we may scan into your chart,   Conditions/risks identified: None   Next appointment: 11/23/2020 @ 9:30 am with Dr. Sallee Lange    Preventive Care 78 Years and Older, Female Preventive care refers to lifestyle choices and visits with your health care provider that can promote health and wellness. What does preventive care include?  A yearly physical exam. This is also called an annual well check.  Dental exams once or twice a year.  Routine eye exams. Ask your health care provider how often you should have your eyes checked.  Personal lifestyle choices, including:  Daily care of your teeth and gums.  Regular physical activity.  Eating a healthy diet.  Avoiding tobacco and drug use.  Limiting alcohol use.  Practicing safe sex.  Taking low-dose aspirin every day.  Taking vitamin and mineral supplements as recommended by your health care provider. What happens during an annual well check? The services and screenings done by your health care provider during your annual well check will depend on your age, overall health, lifestyle risk  factors, and family history of disease. Counseling  Your health care provider may ask you questions about your:  Alcohol use.  Tobacco use.  Drug use.  Emotional well-being.  Home and relationship well-being.  Sexual activity.  Eating habits.  History of falls.  Memory and ability to understand (cognition).  Work and work Statistician.  Reproductive health. Screening  You may have the following tests or measurements:  Height, weight, and BMI.  Blood pressure.  Lipid and cholesterol levels. These may be checked every 5 years, or more frequently if you are over 57 years old.  Skin check.  Lung cancer screening. You may have this screening every year starting at age 78 if you have a 30-pack-year history of smoking and currently smoke or have quit within the past 15 years.  Fecal occult blood test (FOBT) of the stool. You may have this test every year starting at age 78.  Flexible sigmoidoscopy or colonoscopy. You may have a sigmoidoscopy every 5 years or a colonoscopy every 10 years starting at age 78.  Hepatitis C blood test.  Hepatitis B blood test.  Sexually transmitted disease (STD) testing.  Diabetes screening. This is done by checking your blood sugar (glucose) after you have not eaten for a while (fasting). You may have this done every 1-3 years.  Bone density scan. This is done to screen for osteoporosis. You may have this done starting at age 78.  Mammogram. This may be done every 1-2 years. Talk to your health care provider about how often you should have regular mammograms. Talk with your health care provider about your test results, treatment options, and if  necessary, the need for more tests. Vaccines  Your health care provider may recommend certain vaccines, such as:  Influenza vaccine. This is recommended every year.  Tetanus, diphtheria, and acellular pertussis (Tdap, Td) vaccine. You may need a Td booster every 10 years.  Zoster vaccine. You  may need this after age 78.  Pneumococcal 13-valent conjugate (PCV13) vaccine. One dose is recommended after age 78.  Pneumococcal polysaccharide (PPSV23) vaccine. One dose is recommended after age 78. Talk to your health care provider about which screenings and vaccines you need and how often you need them. This information is not intended to replace advice given to you by your health care provider. Make sure you discuss any questions you have with your health care provider. Document Released: 04/23/2015 Document Revised: 12/15/2015 Document Reviewed: 01/26/2015 Elsevier Interactive Patient Education  2017 [place] Prevention in the Home Falls can cause injuries. They can happen to people of all ages. There are many things you can do to make your home safe and to help prevent falls. What can I do on the outside of my home?  Regularly fix the edges of walkways and driveways and fix any cracks.  Remove anything that might make you trip as you walk through a door, such as a raised step or threshold.  Trim any bushes or trees on the path to your home.  Use bright outdoor lighting.  Clear any walking paths of anything that might make someone trip, such as rocks or tools.  Regularly check to see if handrails are loose or broken. Make sure that both sides of any steps have handrails.  Any raised decks and porches should have guardrails on the edges.  Have any leaves, snow, or ice cleared regularly.  Use sand or salt on walking paths during winter.  Clean up any spills in your garage right away. This includes oil or grease spills. What can I do in the bathroom?  Use night lights.  Install grab bars by the toilet and in the tub and shower. Do not use towel bars as grab bars.  Use non-skid mats or decals in the tub or shower.  If you need to sit down in the shower, use a plastic, non-slip stool.  Keep the floor dry. Clean up any water that spills on the floor as soon as  it happens.  Remove soap buildup in the tub or shower regularly.  Attach bath mats securely with double-sided non-slip rug tape.  Do not have throw rugs and other things on the floor that can make you trip. What can I do in the bedroom?  Use night lights.  Make sure that you have a light by your bed that is easy to reach.  Do not use any sheets or blankets that are too big for your bed. They should not hang down onto the floor.  Have a firm chair that has side arms. You can use this for support while you get dressed.  Do not have throw rugs and other things on the floor that can make you trip. What can I do in the kitchen?  Clean up any spills right away.  Avoid walking on wet floors.  Keep items that you use a lot in easy-to-reach places.  If you need to reach something above you, use a strong step stool that has a grab bar.  Keep electrical cords out of the way.  Do not use floor polish or wax that makes floors slippery. If you must  use wax, use non-skid floor wax.  Do not have throw rugs and other things on the floor that can make you trip. What can I do with my stairs?  Do not leave any items on the stairs.  Make sure that there are handrails on both sides of the stairs and use them. Fix handrails that are broken or loose. Make sure that handrails are as long as the stairways.  Check any carpeting to make sure that it is firmly attached to the stairs. Fix any carpet that is loose or worn.  Avoid having throw rugs at the top or bottom of the stairs. If you do have throw rugs, attach them to the floor with carpet tape.  Make sure that you have a light switch at the top of the stairs and the bottom of the stairs. If you do not have them, ask someone to add them for you. What else can I do to help prevent falls?  Wear shoes that:  Do not have high heels.  Have rubber bottoms.  Are comfortable and fit you well.  Are closed at the toe. Do not wear sandals.  If  you use a stepladder:  Make sure that it is fully opened. Do not climb a closed stepladder.  Make sure that both sides of the stepladder are locked into place.  Ask someone to hold it for you, if possible.  Clearly mark and make sure that you can see:  Any grab bars or handrails.  First and last steps.  Where the edge of each step is.  Use tools that help you move around (mobility aids) if they are needed. These include:  Canes.  Walkers.  Scooters.  Crutches.  Turn on the lights when you go into a dark area. Replace any light bulbs as soon as they burn out.  Set up your furniture so you have a clear path. Avoid moving your furniture around.  If any of your floors are uneven, fix them.  If there are any pets around you, be aware of where they are.  Review your medicines with your doctor. Some medicines can make you feel dizzy. This can increase your chance of falling. Ask your doctor what other things that you can do to help prevent falls. This information is not intended to replace advice given to you by your health care provider. Make sure you discuss any questions you have with your health care provider. Document Released: 01/21/2009 Document Revised: 09/02/2015 Document Reviewed: 05/01/2014 Elsevier Interactive Patient Education  2017 [employer].

## 2020-09-27 ENCOUNTER — Telehealth: Payer: Medicare Other | Admitting: Thoracic Surgery (Cardiothoracic Vascular Surgery)

## 2020-09-27 ENCOUNTER — Encounter: Payer: Medicare Other | Admitting: Thoracic Surgery (Cardiothoracic Vascular Surgery)

## 2020-09-29 ENCOUNTER — Encounter: Payer: Self-pay | Admitting: Thoracic Surgery (Cardiothoracic Vascular Surgery)

## 2020-09-29 ENCOUNTER — Telehealth (INDEPENDENT_AMBULATORY_CARE_PROVIDER_SITE_OTHER): Payer: Medicare Other | Admitting: Thoracic Surgery (Cardiothoracic Vascular Surgery)

## 2020-09-29 ENCOUNTER — Other Ambulatory Visit: Payer: Self-pay

## 2020-09-29 DIAGNOSIS — Z953 Presence of xenogenic heart valve: Secondary | ICD-10-CM

## 2020-09-29 DIAGNOSIS — Z8679 Personal history of other diseases of the circulatory system: Secondary | ICD-10-CM

## 2020-09-29 DIAGNOSIS — I484 Atypical atrial flutter: Secondary | ICD-10-CM

## 2020-09-29 DIAGNOSIS — Z9889 Other specified postprocedural states: Secondary | ICD-10-CM | POA: Diagnosis not present
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
## 2020-09-29 NOTE — Patient Instructions (Signed)

## 2020-10-05 ENCOUNTER — Ambulatory Visit (HOSPITAL_COMMUNITY)
Admission: RE | Admit: 2020-10-05 | Discharge: 2020-10-05 | Disposition: A | Discharge status: Home / Self Care | Payer: Medicare Other | Source: Ambulatory Visit | Attending: Nurse Practitioner | Admitting: Nurse Practitioner

## 2020-10-05 ENCOUNTER — Other Ambulatory Visit: Payer: Self-pay

## 2020-10-05 ENCOUNTER — Encounter (HOSPITAL_COMMUNITY): Payer: Self-pay | Admitting: Nurse Practitioner

## 2020-10-05 VITALS — BP 136/86 | HR 63 | Ht 61.0 in | Wt 122.6 lb

## 2020-10-05 DIAGNOSIS — D6869 Other thrombophilia: Secondary | ICD-10-CM | POA: Diagnosis not present

## 2020-10-05 DIAGNOSIS — Z7901 Long term (current) use of anticoagulants: Secondary | ICD-10-CM | POA: Diagnosis not present

## 2020-10-05 DIAGNOSIS — I2510 Atherosclerotic heart disease of native coronary artery without angina pectoris: Secondary | ICD-10-CM | POA: Insufficient documentation

## 2020-10-05 DIAGNOSIS — I099 Rheumatic heart disease, unspecified: Secondary | ICD-10-CM | POA: Diagnosis not present

## 2020-10-05 DIAGNOSIS — I10 Essential (primary) hypertension: Secondary | ICD-10-CM | POA: Diagnosis not present

## 2020-10-05 DIAGNOSIS — Z87891 Personal history of nicotine dependence: Secondary | ICD-10-CM | POA: Diagnosis not present

## 2020-10-05 DIAGNOSIS — E039 Hypothyroidism, unspecified: Secondary | ICD-10-CM | POA: Diagnosis not present

## 2020-10-05 DIAGNOSIS — Z79899 Other long term (current) drug therapy: Secondary | ICD-10-CM | POA: Diagnosis not present

## 2020-10-05 DIAGNOSIS — I480 Paroxysmal atrial fibrillation: Secondary | ICD-10-CM | POA: Diagnosis not present

## 2020-10-05 DIAGNOSIS — Z953 Presence of xenogenic heart valve: Secondary | ICD-10-CM | POA: Diagnosis not present

## 2020-10-05 DIAGNOSIS — I4892 Unspecified atrial flutter: Secondary | ICD-10-CM

## 2020-10-05 LAB — BASIC METABOLIC PANEL
Anion gap: 13 (ref 5–15)
BUN: 13 mg/dL (ref 8–23)
CO2: 23 mmol/L (ref 22–32)
Calcium: 9.1 mg/dL (ref 8.9–10.3)
Chloride: 103 mmol/L (ref 98–111)
Creatinine, Ser: 0.75 mg/dL (ref 0.44–1.00)
GFR, Estimated: >60 mL/min (ref >60)
Glucose, Bld: 92 mg/dL (ref 70–99)
Potassium: 4.1 mmol/L (ref 3.5–5.1)
Sodium: 139 mmol/L (ref 135–145)

## 2020-10-05 LAB — MAGNESIUM: Magnesium: 2.1 mg/dL (ref 1.7–2.4)

## 2020-10-05 MED ORDER — AMLODIPINE BESYLATE 2.5 MG PO TABS: 2.5000 mg | ORAL_TABLET | Freq: Every day | ORAL | 1 refills | Status: DC
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
## 2020-10-10 ENCOUNTER — Emergency Department (HOSPITAL_COMMUNITY): Payer: Medicare Other

## 2020-10-10 ENCOUNTER — Other Ambulatory Visit: Payer: Self-pay

## 2020-10-10 ENCOUNTER — Encounter (HOSPITAL_COMMUNITY): Payer: Self-pay | Admitting: Emergency Medicine

## 2020-10-10 ENCOUNTER — Emergency Department (HOSPITAL_COMMUNITY)
Admission: EM | Admit: 2020-10-10 | Discharge: 2020-10-10 | Disposition: A | Discharge status: Home / Self Care | Payer: Medicare Other | Attending: Emergency Medicine | Admitting: Emergency Medicine

## 2020-10-10 DIAGNOSIS — R0789 Other chest pain: Secondary | ICD-10-CM | POA: Diagnosis not present

## 2020-10-10 DIAGNOSIS — E039 Hypothyroidism, unspecified: Secondary | ICD-10-CM | POA: Insufficient documentation

## 2020-10-10 DIAGNOSIS — I4819 Other persistent atrial fibrillation: Secondary | ICD-10-CM | POA: Diagnosis not present

## 2020-10-10 DIAGNOSIS — Z7901 Long term (current) use of anticoagulants: Secondary | ICD-10-CM | POA: Diagnosis not present

## 2020-10-10 DIAGNOSIS — I5033 Acute on chronic diastolic (congestive) heart failure: Secondary | ICD-10-CM | POA: Insufficient documentation

## 2020-10-10 DIAGNOSIS — I110 Hypertensive heart disease with heart failure: Secondary | ICD-10-CM | POA: Insufficient documentation

## 2020-10-10 DIAGNOSIS — R519 Headache, unspecified: Secondary | ICD-10-CM | POA: Diagnosis not present

## 2020-10-10 DIAGNOSIS — Z79899 Other long term (current) drug therapy: Secondary | ICD-10-CM | POA: Insufficient documentation

## 2020-10-10 DIAGNOSIS — Z87891 Personal history of nicotine dependence: Secondary | ICD-10-CM | POA: Insufficient documentation

## 2020-10-10 DIAGNOSIS — I10 Essential (primary) hypertension: Secondary | ICD-10-CM | POA: Diagnosis not present

## 2020-10-10 DIAGNOSIS — H538 Other visual disturbances: Secondary | ICD-10-CM | POA: Diagnosis not present

## 2020-10-10 DIAGNOSIS — Z91040 Latex allergy status: Secondary | ICD-10-CM | POA: Insufficient documentation

## 2020-10-10 LAB — CBC WITH DIFFERENTIAL/PLATELET
Abs Immature Granulocytes: 0.02 10*3/uL (ref 0.00–0.07)
Basophils Absolute: 0 10*3/uL (ref 0.0–0.1)
Basophils Relative: 0 %
Eosinophils Absolute: 0.1 10*3/uL (ref 0.0–0.5)
Eosinophils Relative: 1 %
HCT: 40.2 % (ref 36.0–46.0)
Hemoglobin: 13.3 g/dL (ref 12.0–15.0)
Immature Granulocytes: 0 %
Lymphocytes Relative: 27 %
Lymphs Abs: 1.3 10*3/uL (ref 0.7–4.0)
MCH: 32.4 pg (ref 26.0–34.0)
MCHC: 33.1 g/dL (ref 30.0–36.0)
MCV: 97.8 fL (ref 80.0–100.0)
Monocytes Absolute: 0.4 10*3/uL (ref 0.1–1.0)
Monocytes Relative: 9 %
Neutro Abs: 3 10*3/uL (ref 1.7–7.7)
Neutrophils Relative %: 63 %
Platelets: 158 10*3/uL (ref 150–400)
RBC: 4.11 MIL/uL (ref 3.87–5.11)
RDW: 11.4 % — ABNORMAL LOW (ref 11.5–15.5)
WBC: 4.9 10*3/uL (ref 4.0–10.5)
nRBC: 0 % (ref 0.0–0.2)

## 2020-10-10 LAB — BASIC METABOLIC PANEL
Anion gap: 9 (ref 5–15)
BUN: 12 mg/dL (ref 8–23)
CO2: 25 mmol/L (ref 22–32)
Calcium: 9.2 mg/dL (ref 8.9–10.3)
Chloride: 104 mmol/L (ref 98–111)
Creatinine, Ser: 0.65 mg/dL (ref 0.44–1.00)
GFR, Estimated: >60 mL/min (ref >60)
Glucose, Bld: 95 mg/dL (ref 70–99)
Potassium: 4.1 mmol/L (ref 3.5–5.1)
Sodium: 138 mmol/L (ref 135–145)

## 2020-10-10 LAB — TROPONIN I (HIGH SENSITIVITY)
Troponin I (High Sensitivity): 7 ng/L (ref <18)
Troponin I (High Sensitivity): 7 ng/L (ref <18)

## 2020-10-10 MED ORDER — APIXABAN 5 MG PO TABS
5.0000 mg | ORAL_TABLET | Freq: Once | ORAL | Status: AC
  Administered 2020-10-10: 5 mg via ORAL
  Filled 2020-10-10: qty 1

## 2020-10-10 MED ORDER — METOPROLOL TARTRATE 25 MG PO TABS
25.0000 mg | ORAL_TABLET | Freq: Once | ORAL | Status: AC
  Administered 2020-10-10: 25 mg via ORAL
  Filled 2020-10-10: qty 1
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
## 2020-10-10 NOTE — Discharge Instructions (Addendum)
You were seen in the emergency department for high blood pressure and blurry vision.  You had blood work EKG and a CAT scan of your head that did not show any significant findings.  Your blood pressure improved while you were here.  Please continue your regular medications and follow-up with your primary care doctor.  Return if any worsening or concerning symptoms

## 2020-10-10 NOTE — ED Provider Notes (Signed)
Emergency Medicine Provider Triage Evaluation Note  [name] , a 80 y.o. female with past medical history of hypertension, paroxysmal atrial fibrillation, prior aortic valve replacement, anticoagulated with Eliquis and takes Tikosyn she was evaluated in triage.  Pt here for evaluation of elevated blood pressure.  She takes her routine medications at night.  Took her amlodipine last evening.  Blood pressure reported today around noon to be 975 systolic.  She took another amlodipine waited 2 hours and systolic pressure 300.  States she feels her vision is blurred and she feels some tightness in her chest and back of her head neck.  She denies any focal weakness, slurred speech, difficulty standing or walking, no shortness of breath.  No history of stroke.  Review of Systems  Positive: Elevated blood pressure, blurred vision, chest tightness, headache Negative: Shortness of breath, fever, chills, weakness of the face or extremities, changes in speech  Physical Exam  BP (!) 146/102 (BP Location: Right Arm)   Pulse 75   Temp 98.6 F (37 C) (Oral)   Resp 17   Ht 5\' 1"  (1.549 m)   Wt 55.6 kg   SpO2 97%   BMI 23.17 kg/m  Gen:   Awake, no distress , appears anxious Resp:  Normal effort lungs clear to auscultation bilaterally MSK:   Moves extremities without difficulty, no peripheral edema Other:    Medical Decision Making  Medically screening exam initiated at 5:26 PM.  Appropriate orders placed.  Hannah Roy was informed that the remainder of the evaluation will be completed by another provider, this initial triage assessment does not replace that evaluation, and the importance of remaining in the ED until their evaluation is complete.  Patient here with significant cardiac history that includes paroxysmal atrial fib, status post mitral and aortic valve replacements, anticoagulated with Eliquis here for elevated blood pressure earlier today.  Took additional dose of her amlodipine today without  improvement.  She will need further evaluation in the emergency department.  Patient agreeable to plan   Kem Parkinson, PA-C 10/10/20 1734    Hayden Rasmussen, MD 10/11/20 1049

## 2020-10-10 NOTE — ED Notes (Signed)
Patient transported to CT 

## 2020-10-10 NOTE — ED Triage Notes (Signed)
Pt to the ED with hypertension reported to be 975 systolic at home.  Pt has a history of heart surgery and is on blood thinners.  Pt states her eyes are blurry and she just feels uneasy.

## 2020-10-13 ENCOUNTER — Other Ambulatory Visit: Payer: Self-pay

## 2020-10-13 ENCOUNTER — Ambulatory Visit (INDEPENDENT_AMBULATORY_CARE_PROVIDER_SITE_OTHER): Payer: Medicare Other | Admitting: Family Medicine

## 2020-10-13 VITALS — BP 118/74 | HR 71 | Temp 97.9°F | Ht 61.0 in | Wt 123.0 lb

## 2020-10-13 DIAGNOSIS — I10 Essential (primary) hypertension: Secondary | ICD-10-CM | POA: Diagnosis not present

## 2020-10-13 NOTE — Progress Notes (Signed)
   Subjective:    Patient ID: Hannah Roy, female    DOB: 1940-11-13, 80 y.o.   MRN: [id number]  Hypertension ER follow up from 7/3  Blood pressure very elevated but now doing well she brings in an extensive amount of numbers to review we looked over them and the vast majority of blood pressure readings were in the very good range with an occasional elevated when she went to the ER for elevated blood pressure which occurred for no particular reason  Review of Systems No doe no cp    Objective:   Physical Exam General-in no acute distress Eyes-no discharge Lungs-respiratory rate normal, CTA CV-no murmurs,RRR Extremities skin warm dry no edema Neuro grossly normal Behavior normal, alert        Assessment & Plan:  Blood pressure good control currently we went over parameters of when to go to the ER and when to treat from home currently utilize a low-dose amlodipine may take a second dose if blood pressure above 160/90 keep regular follow-ups

## 2020-10-19 ENCOUNTER — Encounter (INDEPENDENT_AMBULATORY_CARE_PROVIDER_SITE_OTHER): Payer: Self-pay

## 2020-10-19 ENCOUNTER — Other Ambulatory Visit: Payer: Self-pay

## 2020-10-19 ENCOUNTER — Ambulatory Visit (INDEPENDENT_AMBULATORY_CARE_PROVIDER_SITE_OTHER): Payer: Medicare Other | Admitting: Cardiology

## 2020-10-19 ENCOUNTER — Encounter: Payer: Self-pay | Admitting: Cardiology

## 2020-10-19 VITALS — BP 132/80 | HR 64 | Ht 61.0 in | Wt 121.0 lb

## 2020-10-19 DIAGNOSIS — I480 Paroxysmal atrial fibrillation: Secondary | ICD-10-CM

## 2020-10-19 DIAGNOSIS — I38 Endocarditis, valve unspecified: Secondary | ICD-10-CM

## 2020-10-19 DIAGNOSIS — I10 Essential (primary) hypertension: Secondary | ICD-10-CM
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
## 2020-10-19 NOTE — Patient Instructions (Signed)
Medication Instructions:  Your physician recommends that you continue on your current medications as directed. Please refer to the Current Medication list given to you today.   *If you need a refill on your cardiac medications before your next appointment, please call your pharmacy*   Lab Work: None today  If you have labs (blood work) drawn today and your tests are completely normal, you will receive your results only by: [place] (if you have MyChart) OR A paper copy in the mail If you have any lab test that is abnormal or we need to change your treatment, we will call you to review the results.   Testing/Procedures: None today    Follow-Up: At [facility], you and your health needs are our priority.  As part of our continuing mission to provide you with exceptional heart care, we have created designated Provider Care Teams.  These Care Teams include your primary Cardiologist (physician) and Advanced Practice Providers (APPs -  Physician Assistants and Nurse Practitioners) who all work together to provide you with the care you need, when you need it.  We recommend signing up for the patient portal called "MyChart".  Sign up information is provided on this After Visit Summary.  MyChart is used to connect with patients for Virtual Visits (Telemedicine).  Patients are able to view lab/test results, encounter notes, upcoming appointments, etc.  Non-urgent messages can be sent to your provider as well.   To learn more about what you can do with MyChart, go to [email or web address].    Your next appointment:   6 month(s)  The format for your next appointment:   In Person  Provider:   Rozann Lesches, MD   Other Instructions  Eliquis 5 mg samples #42, lot [id number], exp 12/2022

## 2020-11-23 ENCOUNTER — Other Ambulatory Visit: Payer: Self-pay

## 2020-11-23 ENCOUNTER — Ambulatory Visit (INDEPENDENT_AMBULATORY_CARE_PROVIDER_SITE_OTHER): Payer: Medicare Other | Admitting: Family Medicine

## 2020-11-23 VITALS — BP 124/80 | HR 62 | Ht 61.0 in | Wt 123.0 lb

## 2020-11-23 DIAGNOSIS — E7849 Other hyperlipidemia: Secondary | ICD-10-CM

## 2020-11-23 DIAGNOSIS — I480 Paroxysmal atrial fibrillation: Secondary | ICD-10-CM

## 2020-11-23 DIAGNOSIS — M19041 Primary osteoarthritis, right hand: Secondary | ICD-10-CM

## 2020-11-23 DIAGNOSIS — Z79899 Other long term (current) drug therapy: Secondary | ICD-10-CM

## 2020-11-23 DIAGNOSIS — M19042 Primary osteoarthritis, left hand: Secondary | ICD-10-CM

## 2020-11-23 DIAGNOSIS — I10 Essential (primary) hypertension: Secondary | ICD-10-CM | POA: Diagnosis not present

## 2020-11-23 MED ORDER — VALACYCLOVIR HCL 1 G PO TABS: ORAL_TABLET | ORAL | 3 refills | Status: DC

## 2020-11-23 NOTE — Progress Notes (Signed)
   Subjective:    Patient ID: Hannah Roy, female    DOB: [birth year], 80 y.o.   MRN: [id number]  Hypertension This is a chronic problem. The current episode started more than 1 year ago. Risk factors for coronary artery disease include dyslipidemia and post-menopausal state. Treatments tried: norvasc. There are no compliance problems.    Would like script to have on hand for fever blisters  Review of Systems     Objective:   Physical Exam  General-in no acute distress Eyes-no discharge Lungs-respiratory rate normal, CTA CV-no murmurs,RRR Extremities skin warm dry no edema Neuro grossly normal Behavior normal, alert       Assessment & Plan:   1. Essential hypertension, benign Blood pressure good control I checked it with a children's cuff.  Blood pressure was 116/74.  Recommend continuing current measures.  Recheck patient in December.  2. Paroxysmal A-fib (HCC) Under good control via cardiology continue current medications  3. Primary osteoarthritis of both hands Significant osteoarthritis of both hands nothing we can do to change this Tylenol as needed

## 2020-11-30 DIAGNOSIS — M532X1 Spinal instabilities, occipito-atlanto-axial region: Secondary | ICD-10-CM | POA: Diagnosis not present

## 2020-11-30 DIAGNOSIS — S129XXD Fracture of neck, unspecified, subsequent encounter: Secondary | ICD-10-CM | POA: Diagnosis not present

## 2020-12-07 ENCOUNTER — Telehealth: Payer: Self-pay | Admitting: Cardiology

## 2020-12-07 NOTE — Telephone Encounter (Signed)
Patient wants to speak to [facility] tomorrow morning. Patient has had a lot of indigestion.  She has taken medication but the symptoms don't go away.  Patient talked to Pharmacist and they suggested she call our office to determine if it is a GI issue or Cardiac issue. Cell [phone]

## 2020-12-07 NOTE — Telephone Encounter (Signed)
Spoke with Pt who c/o indigestion for the last 2-3 days after eatting. No c/o chest pain at this time. She will call PCP office and call our office back if needed.

## 2020-12-22 ENCOUNTER — Other Ambulatory Visit (HOSPITAL_COMMUNITY): Payer: Self-pay | Admitting: Family Medicine

## 2020-12-22 DIAGNOSIS — Z1231 Encounter for screening mammogram for malignant neoplasm of breast: Secondary | ICD-10-CM

## 2021-01-12 ENCOUNTER — Other Ambulatory Visit (INDEPENDENT_AMBULATORY_CARE_PROVIDER_SITE_OTHER): Payer: Medicare Other | Admitting: *Deleted

## 2021-01-12 ENCOUNTER — Other Ambulatory Visit: Payer: Self-pay

## 2021-01-12 DIAGNOSIS — Z23 Encounter for immunization: Secondary | ICD-10-CM

## 2021-01-17 ENCOUNTER — Ambulatory Visit (HOSPITAL_COMMUNITY)
Admission: RE | Admit: 2021-01-17 | Discharge: 2021-01-17 | Disposition: A | Discharge status: Home / Self Care | Payer: Medicare Other | Source: Ambulatory Visit | Attending: Family Medicine | Admitting: Family Medicine

## 2021-01-17 ENCOUNTER — Other Ambulatory Visit: Payer: Self-pay

## 2021-01-17 DIAGNOSIS — Z1231 Encounter for screening mammogram for malignant neoplasm of breast: Secondary | ICD-10-CM | POA: Insufficient documentation

## 2021-01-18 ENCOUNTER — Other Ambulatory Visit: Payer: Self-pay | Admitting: Nurse Practitioner

## 2021-01-27 DIAGNOSIS — H04123 Dry eye syndrome of bilateral lacrimal glands: Secondary | ICD-10-CM | POA: Diagnosis not present

## 2021-02-01 ENCOUNTER — Ambulatory Visit (HOSPITAL_COMMUNITY)
Admission: RE | Admit: 2021-02-01 | Discharge: 2021-02-01 | Disposition: A | Discharge status: Home / Self Care | Payer: Medicare Other | Source: Ambulatory Visit | Attending: Nurse Practitioner | Admitting: Nurse Practitioner

## 2021-02-01 ENCOUNTER — Encounter (HOSPITAL_COMMUNITY): Payer: Self-pay | Admitting: Nurse Practitioner

## 2021-02-01 ENCOUNTER — Other Ambulatory Visit (HOSPITAL_COMMUNITY): Payer: Self-pay

## 2021-02-01 ENCOUNTER — Other Ambulatory Visit: Payer: Self-pay

## 2021-02-01 VITALS — BP 160/84 | HR 61 | Ht 61.0 in | Wt 123.4 lb

## 2021-02-01 DIAGNOSIS — Z952 Presence of prosthetic heart valve: Secondary | ICD-10-CM | POA: Insufficient documentation

## 2021-02-01 DIAGNOSIS — I10 Essential (primary) hypertension: Secondary | ICD-10-CM | POA: Insufficient documentation

## 2021-02-01 DIAGNOSIS — E039 Hypothyroidism, unspecified: Secondary | ICD-10-CM | POA: Diagnosis not present

## 2021-02-01 DIAGNOSIS — I2510 Atherosclerotic heart disease of native coronary artery without angina pectoris: Secondary | ICD-10-CM | POA: Diagnosis not present

## 2021-02-01 DIAGNOSIS — D6869 Other thrombophilia: Secondary | ICD-10-CM

## 2021-02-01 DIAGNOSIS — Z7901 Long term (current) use of anticoagulants: Secondary | ICD-10-CM | POA: Diagnosis not present

## 2021-02-01 DIAGNOSIS — Z79899 Other long term (current) drug therapy: Secondary | ICD-10-CM | POA: Diagnosis not present

## 2021-02-01 DIAGNOSIS — I082 Rheumatic disorders of both aortic and tricuspid valves: Secondary | ICD-10-CM | POA: Insufficient documentation

## 2021-02-01 DIAGNOSIS — Z87820 Personal history of traumatic brain injury: Secondary | ICD-10-CM | POA: Insufficient documentation

## 2021-02-01 DIAGNOSIS — I480 Paroxysmal atrial fibrillation: Secondary | ICD-10-CM | POA: Diagnosis not present

## 2021-02-01 DIAGNOSIS — I099 Rheumatic heart disease, unspecified: Secondary | ICD-10-CM | POA: Insufficient documentation

## 2021-02-01 LAB — BASIC METABOLIC PANEL
Anion gap: 7 (ref 5–15)
BUN: 12 mg/dL (ref 8–23)
CO2: 26 mmol/L (ref 22–32)
Calcium: 9.2 mg/dL (ref 8.9–10.3)
Chloride: 106 mmol/L (ref 98–111)
Creatinine, Ser: 0.84 mg/dL (ref 0.44–1.00)
GFR, Estimated: >60 mL/min (ref >60)
Glucose, Bld: 95 mg/dL (ref 70–99)
Potassium: 4.3 mmol/L (ref 3.5–5.1)
Sodium: 139 mmol/L (ref 135–145)

## 2021-02-01 LAB — MAGNESIUM: Magnesium: 2.2 mg/dL (ref 1.7–2.4)

## 2021-02-01 MED ORDER — DOFETILIDE 250 MCG PO CAPS: 250.0000 ug | ORAL_CAPSULE | Freq: Two times a day (BID) | ORAL | 2 refills | Status: DC
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
## 2021-02-04 ENCOUNTER — Other Ambulatory Visit (HOSPITAL_COMMUNITY): Payer: Self-pay

## 2021-02-04 MED ORDER — APIXABAN 5 MG PO TABS: 5.0000 mg | ORAL_TABLET | Freq: Two times a day (BID) | ORAL | 0 refills | Status: DC

## 2021-02-05 ENCOUNTER — Inpatient Hospital Stay (HOSPITAL_COMMUNITY)
Admission: EM | Admit: 2021-02-05 | Discharge: 2021-02-08 | DRG: 281 | Disposition: A | Discharge status: Home / Self Care | Payer: Medicare Other | Attending: Internal Medicine | Admitting: Internal Medicine

## 2021-02-05 ENCOUNTER — Other Ambulatory Visit: Payer: Self-pay

## 2021-02-05 ENCOUNTER — Encounter (HOSPITAL_COMMUNITY): Payer: Self-pay

## 2021-02-05 ENCOUNTER — Emergency Department (HOSPITAL_COMMUNITY): Payer: Medicare Other

## 2021-02-05 DIAGNOSIS — Z79899 Other long term (current) drug therapy: Secondary | ICD-10-CM

## 2021-02-05 DIAGNOSIS — R42 Dizziness and giddiness: Secondary | ICD-10-CM | POA: Diagnosis not present

## 2021-02-05 DIAGNOSIS — E785 Hyperlipidemia, unspecified: Secondary | ICD-10-CM | POA: Diagnosis present

## 2021-02-05 DIAGNOSIS — Z888 Allergy status to other drugs, medicaments and biological substances status: Secondary | ICD-10-CM

## 2021-02-05 DIAGNOSIS — Z20822 Contact with and (suspected) exposure to covid-19: Secondary | ICD-10-CM | POA: Diagnosis not present

## 2021-02-05 DIAGNOSIS — I160 Hypertensive urgency: Secondary | ICD-10-CM | POA: Diagnosis present

## 2021-02-05 DIAGNOSIS — I4819 Other persistent atrial fibrillation: Secondary | ICD-10-CM | POA: Diagnosis present

## 2021-02-05 DIAGNOSIS — I25119 Atherosclerotic heart disease of native coronary artery with unspecified angina pectoris: Secondary | ICD-10-CM | POA: Diagnosis present

## 2021-02-05 DIAGNOSIS — E039 Hypothyroidism, unspecified: Secondary | ICD-10-CM | POA: Diagnosis not present

## 2021-02-05 DIAGNOSIS — Z7901 Long term (current) use of anticoagulants: Secondary | ICD-10-CM

## 2021-02-05 DIAGNOSIS — I10 Essential (primary) hypertension: Secondary | ICD-10-CM | POA: Diagnosis present

## 2021-02-05 DIAGNOSIS — Z91040 Latex allergy status: Secondary | ICD-10-CM

## 2021-02-05 DIAGNOSIS — I4510 Unspecified right bundle-branch block: Secondary | ICD-10-CM | POA: Diagnosis not present

## 2021-02-05 DIAGNOSIS — Z87820 Personal history of traumatic brain injury: Secondary | ICD-10-CM

## 2021-02-05 DIAGNOSIS — I480 Paroxysmal atrial fibrillation: Secondary | ICD-10-CM | POA: Diagnosis present

## 2021-02-05 DIAGNOSIS — Z91030 Bee allergy status: Secondary | ICD-10-CM

## 2021-02-05 DIAGNOSIS — I214 Non-ST elevation (NSTEMI) myocardial infarction: Secondary | ICD-10-CM | POA: Diagnosis not present

## 2021-02-05 LAB — CBC WITH DIFFERENTIAL/PLATELET
Abs Immature Granulocytes: 0.02 10*3/uL (ref 0.00–0.07)
Basophils Absolute: 0 10*3/uL (ref 0.0–0.1)
Basophils Relative: 0 %
Eosinophils Absolute: 0.2 10*3/uL (ref 0.0–0.5)
Eosinophils Relative: 3 %
HCT: 36.6 % (ref 36.0–46.0)
Hemoglobin: 12.4 g/dL (ref 12.0–15.0)
Immature Granulocytes: 0 %
Lymphocytes Relative: 27 %
Lymphs Abs: 1.2 10*3/uL (ref 0.7–4.0)
MCH: 32.6 pg (ref 26.0–34.0)
MCHC: 33.9 g/dL (ref 30.0–36.0)
MCV: 96.3 fL (ref 80.0–100.0)
Monocytes Absolute: 0.3 10*3/uL (ref 0.1–1.0)
Monocytes Relative: 7 %
Neutro Abs: 2.7 10*3/uL (ref 1.7–7.7)
Neutrophils Relative %: 63 %
Platelets: 152 10*3/uL (ref 150–400)
RBC: 3.8 MIL/uL — ABNORMAL LOW (ref 3.87–5.11)
RDW: 12.2 % (ref 11.5–15.5)
WBC: 4.5 10*3/uL (ref 4.0–10.5)
nRBC: 0 % (ref 0.0–0.2)

## 2021-02-05 LAB — BASIC METABOLIC PANEL
Anion gap: 11 (ref 5–15)
BUN: 13 mg/dL (ref 8–23)
CO2: 25 mmol/L (ref 22–32)
Calcium: 9.6 mg/dL (ref 8.9–10.3)
Chloride: 103 mmol/L (ref 98–111)
Creatinine, Ser: 0.76 mg/dL (ref 0.44–1.00)
GFR, Estimated: >60 mL/min (ref >60)
Glucose, Bld: 121 mg/dL — ABNORMAL HIGH (ref 70–99)
Potassium: 3.4 mmol/L — ABNORMAL LOW (ref 3.5–5.1)
Sodium: 139 mmol/L (ref 135–145)

## 2021-02-05 LAB — PROTIME-INR
INR: 1 (ref 0.8–1.2)
Prothrombin Time: 12.8 seconds (ref 11.4–15.2)

## 2021-02-05 LAB — TROPONIN I (HIGH SENSITIVITY)
Troponin I (High Sensitivity): 296 ng/L (ref <18)
Troponin I (High Sensitivity): 1124 ng/L (ref <18)

## 2021-02-05 MED ORDER — ASPIRIN 325 MG PO TABS
325.0000 mg | ORAL_TABLET | Freq: Once | ORAL | Status: AC
  Administered 2021-02-05: 325 mg via ORAL
  Filled 2021-02-05: qty 1

## 2021-02-05 NOTE — ED Notes (Signed)
Date and time results received: 02/05/21 2101 (use smartphrase ".now" to insert current time)  Test: troponin  Critical Value: 296  Name of Provider Notified: Sharmon Leyden, MD

## 2021-02-05 NOTE — ED Notes (Signed)
\  Date and time results received: 02/05/21 2325   Test: Troponin Critical Value: 1124  Name of Provider Notified: Roderic Palau, MD; Christy Gentles, MD
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
## 2021-02-05 NOTE — ED Triage Notes (Signed)
Pt arrived via POV c/o hypertension at home of 212/95 at 1800 tonight. Pt called her PCP and was recommended to take 5mg  of her Amlodipine and rest of her medication and lay down. Pt also endorsing some chest discomfort, sweating, right arm pain. Pt also endorsing some dizziness as well.

## 2021-02-06 ENCOUNTER — Telehealth: Payer: Self-pay | Admitting: Medical

## 2021-02-06 DIAGNOSIS — I160 Hypertensive urgency: Secondary | ICD-10-CM

## 2021-02-06 DIAGNOSIS — Z79899 Other long term (current) drug therapy: Secondary | ICD-10-CM | POA: Diagnosis not present

## 2021-02-06 DIAGNOSIS — E785 Hyperlipidemia, unspecified: Secondary | ICD-10-CM | POA: Diagnosis not present

## 2021-02-06 DIAGNOSIS — Z91040 Latex allergy status: Secondary | ICD-10-CM | POA: Diagnosis not present

## 2021-02-06 DIAGNOSIS — Z7901 Long term (current) use of anticoagulants: Secondary | ICD-10-CM | POA: Diagnosis not present

## 2021-02-06 DIAGNOSIS — I10 Essential (primary) hypertension: Secondary | ICD-10-CM | POA: Diagnosis not present

## 2021-02-06 DIAGNOSIS — I2510 Atherosclerotic heart disease of native coronary artery without angina pectoris: Secondary | ICD-10-CM | POA: Diagnosis not present

## 2021-02-06 DIAGNOSIS — I5032 Chronic diastolic (congestive) heart failure: Secondary | ICD-10-CM | POA: Diagnosis not present

## 2021-02-06 DIAGNOSIS — Z91030 Bee allergy status: Secondary | ICD-10-CM | POA: Diagnosis not present

## 2021-02-06 DIAGNOSIS — I4819 Other persistent atrial fibrillation: Secondary | ICD-10-CM

## 2021-02-06 DIAGNOSIS — Z20822 Contact with and (suspected) exposure to covid-19: Secondary | ICD-10-CM | POA: Diagnosis not present

## 2021-02-06 DIAGNOSIS — I480 Paroxysmal atrial fibrillation: Secondary | ICD-10-CM | POA: Diagnosis not present

## 2021-02-06 DIAGNOSIS — I25119 Atherosclerotic heart disease of native coronary artery with unspecified angina pectoris: Secondary | ICD-10-CM | POA: Diagnosis not present

## 2021-02-06 DIAGNOSIS — Z888 Allergy status to other drugs, medicaments and biological substances status: Secondary | ICD-10-CM | POA: Diagnosis not present

## 2021-02-06 DIAGNOSIS — E039 Hypothyroidism, unspecified: Secondary | ICD-10-CM | POA: Diagnosis not present

## 2021-02-06 DIAGNOSIS — Z87820 Personal history of traumatic brain injury: Secondary | ICD-10-CM | POA: Diagnosis not present

## 2021-02-06 DIAGNOSIS — I214 Non-ST elevation (NSTEMI) myocardial infarction: Secondary | ICD-10-CM | POA: Diagnosis not present

## 2021-02-06 DIAGNOSIS — I4510 Unspecified right bundle-branch block: Secondary | ICD-10-CM | POA: Diagnosis not present

## 2021-02-06 LAB — TROPONIN I (HIGH SENSITIVITY)
Troponin I (High Sensitivity): 480 ng/L (ref <18)
Troponin I (High Sensitivity): 447 ng/L (ref <18)

## 2021-02-06 LAB — RESP PANEL BY RT-PCR (FLU A&B, COVID) ARPGX2
Influenza A by PCR: NEGATIVE
Influenza B by PCR: NEGATIVE
SARS Coronavirus 2 by RT PCR: NEGATIVE

## 2021-02-06 MED ORDER — ROSUVASTATIN CALCIUM 5 MG PO TABS
5.0000 mg | ORAL_TABLET | ORAL | Status: DC
  Administered 2021-02-07: 5 mg via ORAL
  Filled 2021-02-06: qty 1

## 2021-02-06 MED ORDER — PANTOPRAZOLE SODIUM 40 MG PO TBEC
40.0000 mg | DELAYED_RELEASE_TABLET | Freq: Every day | ORAL | Status: DC
  Administered 2021-02-06 – 2021-02-08 (×3): 40 mg via ORAL
  Filled 2021-02-06 (×3): qty 1

## 2021-02-06 MED ORDER — APIXABAN 5 MG PO TABS
5.0000 mg | ORAL_TABLET | Freq: Two times a day (BID) | ORAL | Status: DC
  Administered 2021-02-06: 5 mg via ORAL
  Filled 2021-02-06: qty 1

## 2021-02-06 MED ORDER — METOPROLOL TARTRATE 25 MG PO TABS
25.0000 mg | ORAL_TABLET | Freq: Two times a day (BID) | ORAL | Status: DC
  Administered 2021-02-06 – 2021-02-08 (×4): 25 mg via ORAL
  Filled 2021-02-06 (×4): qty 1

## 2021-02-06 MED ORDER — NITROGLYCERIN 0.4 MG SL SUBL: 0.4000 mg | SUBLINGUAL_TABLET | SUBLINGUAL | Status: DC | PRN

## 2021-02-06 MED ORDER — POTASSIUM CHLORIDE CRYS ER 20 MEQ PO TBCR
40.0000 meq | EXTENDED_RELEASE_TABLET | Freq: Once | ORAL | Status: AC
  Administered 2021-02-06: 40 meq via ORAL
  Filled 2021-02-06: qty 2

## 2021-02-06 MED ORDER — DOFETILIDE 250 MCG PO CAPS
250.0000 ug | ORAL_CAPSULE | Freq: Two times a day (BID) | ORAL | Status: DC
  Administered 2021-02-06 – 2021-02-08 (×4): 250 ug via ORAL
  Filled 2021-02-06 (×4): qty 1

## 2021-02-06 MED ORDER — ACETAMINOPHEN 325 MG PO TABS: 650.0000 mg | ORAL_TABLET | ORAL | Status: DC | PRN

## 2021-02-06 MED ORDER — POTASSIUM CHLORIDE CRYS ER 20 MEQ PO TBCR
20.0000 meq | EXTENDED_RELEASE_TABLET | Freq: Every day | ORAL | Status: DC
  Administered 2021-02-06 – 2021-02-08 (×3): 20 meq via ORAL
  Filled 2021-02-06 (×3): qty 1

## 2021-02-06 MED ORDER — DOFETILIDE 250 MCG PO CAPS
250.0000 ug | ORAL_CAPSULE | Freq: Two times a day (BID) | ORAL | Status: DC
  Administered 2021-02-06: 250 ug via ORAL
  Filled 2021-02-06 (×3): qty 1

## 2021-02-06 MED ORDER — APIXABAN 5 MG PO TABS: 5.0000 mg | ORAL_TABLET | Freq: Two times a day (BID) | ORAL | Status: DC

## 2021-02-06 MED ORDER — MELATONIN 5 MG PO TABS
5.0000 mg | ORAL_TABLET | Freq: Every evening | ORAL | Status: DC | PRN
  Administered 2021-02-06: 5 mg via ORAL
  Filled 2021-02-06: qty 1

## 2021-02-06 MED ORDER — ONDANSETRON HCL 4 MG/2ML IJ SOLN: 4.0000 mg | Freq: Four times a day (QID) | INTRAMUSCULAR | Status: DC | PRN

## 2021-02-06 MED ORDER — AMLODIPINE BESYLATE 2.5 MG PO TABS
2.5000 mg | ORAL_TABLET | Freq: Every day | ORAL | Status: DC
  Administered 2021-02-06 – 2021-02-07 (×2): 2.5 mg via ORAL
  Filled 2021-02-06 (×3): qty 1

## 2021-02-06 NOTE — ED Provider Notes (Signed)
I assumed care at signout to follow-up on troponin and admit.  Troponin is elevated, discussed with Dr. Humphrey Rolls  with cardiology at [facility].  Plan to transfer to [facility] for admission to the stepdown unit.  Patient is stable at this time   Ripley Fraise, MD 02/06/21 [phone]

## 2021-02-06 NOTE — ED Provider Notes (Signed)
Patient denies chest pain. She is awake alert no acute distress.  No arm or leg drift. Distal pulses are equal intact in all 4 extremities. She is awaiting admission   Ripley Fraise, MD 02/06/21 [phone]

## 2021-02-06 NOTE — ED Notes (Signed)
Put new brief on pt packed up her belonging ready for transport

## 2021-02-06 NOTE — Telephone Encounter (Signed)
Error
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
## 2021-02-06 NOTE — Plan of Care (Signed)

## 2021-02-06 NOTE — ED Notes (Signed)
[facility] called at this time to setup transport.  

## 2021-02-07 ENCOUNTER — Encounter (HOSPITAL_COMMUNITY)
Admission: EM | Disposition: A | Discharge status: Home / Self Care | Payer: Self-pay | Source: Home / Self Care | Attending: Internal Medicine

## 2021-02-07 ENCOUNTER — Inpatient Hospital Stay (HOSPITAL_COMMUNITY): Payer: Medicare Other

## 2021-02-07 DIAGNOSIS — I480 Paroxysmal atrial fibrillation: Secondary | ICD-10-CM

## 2021-02-07 DIAGNOSIS — I160 Hypertensive urgency: Secondary | ICD-10-CM | POA: Diagnosis not present

## 2021-02-07 DIAGNOSIS — I5032 Chronic diastolic (congestive) heart failure: Secondary | ICD-10-CM

## 2021-02-07 DIAGNOSIS — I214 Non-ST elevation (NSTEMI) myocardial infarction: Secondary | ICD-10-CM | POA: Diagnosis not present

## 2021-02-07 DIAGNOSIS — E785 Hyperlipidemia, unspecified: Secondary | ICD-10-CM | POA: Diagnosis not present

## 2021-02-07 DIAGNOSIS — I2510 Atherosclerotic heart disease of native coronary artery without angina pectoris: Secondary | ICD-10-CM | POA: Diagnosis not present

## 2021-02-07 HISTORY — PX: CORONARY ANGIOGRAPHY: CATH118303

## 2021-02-07 LAB — ECHOCARDIOGRAM COMPLETE
AR max vel: 2.09 cm2
AV Area VTI: 2.18 cm2
AV Area mean vel: 2.04 cm2
AV Mean grad: 9.7 mmHg
AV Peak grad: 19.6 mmHg
Ao pk vel: 2.21 m/s
Area-P 1/2: 3.39 cm2
Height: 61 in
MV VTI: 2.5 cm2
S' Lateral: 3.1 cm
Weight: 1932.99 oz

## 2021-02-07 LAB — HEMOGLOBIN A1C
Hgb A1c MFr Bld: 5.2 % (ref 4.8–5.6)
Mean Plasma Glucose: 102.54 mg/dL

## 2021-02-07 LAB — BASIC METABOLIC PANEL WITH GFR
Anion gap: 9 (ref 5–15)
BUN: 24 mg/dL — ABNORMAL HIGH (ref 8–23)
CO2: 24 mmol/L (ref 22–32)
Calcium: 10.1 mg/dL (ref 8.9–10.3)
Chloride: 104 mmol/L (ref 98–111)
Creatinine, Ser: 1.04 mg/dL — ABNORMAL HIGH (ref 0.44–1.00)
GFR, Estimated: 55 mL/min — ABNORMAL LOW
Glucose, Bld: 104 mg/dL — ABNORMAL HIGH (ref 70–99)
Potassium: 4.7 mmol/L (ref 3.5–5.1)
Sodium: 137 mmol/L (ref 135–145)

## 2021-02-07 LAB — LIPID PANEL
Cholesterol: 167 mg/dL (ref 0–200)
HDL: 53 mg/dL
LDL Cholesterol: 94 mg/dL (ref 0–99)
Total CHOL/HDL Ratio: 3.2 ratio
Triglycerides: 102 mg/dL
VLDL: 20 mg/dL (ref 0–40)

## 2021-02-07 LAB — TSH: TSH: 4.32 u[IU]/mL (ref 0.350–4.500)

## 2021-02-07 SURGERY — CORONARY ANGIOGRAPHY (CATH LAB)
Anesthesia: LOCAL

## 2021-02-07 MED ORDER — SODIUM CHLORIDE 0.9% FLUSH
3.0000 mL | INTRAVENOUS | Status: DC | PRN
Start: 2021-02-07 — End: 2021-02-07

## 2021-02-07 MED ORDER — IOHEXOL 350 MG/ML SOLN
INTRAVENOUS | Status: DC | PRN
  Administered 2021-02-07: 60 mL

## 2021-02-07 MED ORDER — VERAPAMIL HCL 2.5 MG/ML IV SOLN
INTRAVENOUS | Status: DC | PRN
  Administered 2021-02-07: 10 mL via INTRA_ARTERIAL

## 2021-02-07 MED ORDER — SODIUM CHLORIDE 0.9 % WEIGHT BASED INFUSION
1.0000 mL/kg/h | INTRAVENOUS | Status: DC
  Administered 2021-02-07: 1 mL/kg/h via INTRAVENOUS

## 2021-02-07 MED ORDER — SODIUM CHLORIDE 0.9% FLUSH: 3.0000 mL | INTRAVENOUS | Status: DC | PRN

## 2021-02-07 MED ORDER — SODIUM CHLORIDE 0.9 % WEIGHT BASED INFUSION
3.0000 mL/kg/h | INTRAVENOUS | Status: AC
  Administered 2021-02-07: 3 mL/kg/h via INTRAVENOUS

## 2021-02-07 MED ORDER — ASPIRIN 81 MG PO CHEW: 81.0000 mg | CHEWABLE_TABLET | ORAL | Status: AC

## 2021-02-07 MED ORDER — ONDANSETRON HCL 4 MG/2ML IJ SOLN: 4.0000 mg | Freq: Four times a day (QID) | INTRAMUSCULAR | Status: DC | PRN

## 2021-02-07 MED ORDER — LIDOCAINE HCL (PF) 1 % IJ SOLN
INTRAMUSCULAR | Status: DC | PRN
  Administered 2021-02-07: 2 mL

## 2021-02-07 MED ORDER — SODIUM CHLORIDE 0.9 % IV SOLN: INTRAVENOUS | Status: AC

## 2021-02-07 MED ORDER — ASPIRIN 81 MG PO CHEW
81.0000 mg | CHEWABLE_TABLET | Freq: Every day | ORAL | Status: DC
  Administered 2021-02-08: 81 mg via ORAL
  Filled 2021-02-07: qty 1

## 2021-02-07 MED ORDER — HEPARIN (PORCINE) IN NACL 1000-0.9 UT/500ML-% IV SOLN
INTRAVENOUS | Status: DC | PRN
  Administered 2021-02-07 (×2): 500 mL

## 2021-02-07 MED ORDER — ROSUVASTATIN CALCIUM 5 MG PO TABS: 10.0000 mg | ORAL_TABLET | ORAL | Status: DC

## 2021-02-07 MED ORDER — MIDAZOLAM HCL 2 MG/2ML IJ SOLN
INTRAMUSCULAR | Status: AC
  Filled 2021-02-07: qty 2

## 2021-02-07 MED ORDER — SODIUM CHLORIDE 0.9% FLUSH
3.0000 mL | Freq: Two times a day (BID) | INTRAVENOUS | Status: DC
  Administered 2021-02-08: 3 mL via INTRAVENOUS

## 2021-02-07 MED ORDER — HEPARIN SODIUM (PORCINE) 1000 UNIT/ML IJ SOLN
INTRAMUSCULAR | Status: DC | PRN
  Administered 2021-02-07: 2700 [IU] via INTRAVENOUS

## 2021-02-07 MED ORDER — SODIUM CHLORIDE 0.9 % IV SOLN: 250.0000 mL | INTRAVENOUS | Status: DC | PRN

## 2021-02-07 MED ORDER — MIDAZOLAM HCL 2 MG/2ML IJ SOLN
INTRAMUSCULAR | Status: DC | PRN
  Administered 2021-02-07: 1 mg via INTRAVENOUS

## 2021-02-07 MED ORDER — HEPARIN SODIUM (PORCINE) 1000 UNIT/ML IJ SOLN
INTRAMUSCULAR | Status: AC
  Filled 2021-02-07: qty 1

## 2021-02-07 MED ORDER — LIDOCAINE HCL (PF) 1 % IJ SOLN
INTRAMUSCULAR | Status: AC
  Filled 2021-02-07: qty 30

## 2021-02-07 MED ORDER — SODIUM CHLORIDE 0.9% FLUSH
3.0000 mL | Freq: Two times a day (BID) | INTRAVENOUS | Status: DC
  Administered 2021-02-07: 3 mL via INTRAVENOUS

## 2021-02-07 MED ORDER — VERAPAMIL HCL 2.5 MG/ML IV SOLN
INTRAVENOUS | Status: AC
  Filled 2021-02-07: qty 2

## 2021-02-07 MED ORDER — FENTANYL CITRATE (PF) 100 MCG/2ML IJ SOLN
INTRAMUSCULAR | Status: AC
  Filled 2021-02-07: qty 2

## 2021-02-07 MED ORDER — ASPIRIN 81 MG PO CHEW
81.0000 mg | CHEWABLE_TABLET | Freq: Once | ORAL | Status: AC
  Administered 2021-02-07: 81 mg via ORAL
  Filled 2021-02-07: qty 1

## 2021-02-07 MED ORDER — HEPARIN (PORCINE) IN NACL 1000-0.9 UT/500ML-% IV SOLN
INTRAVENOUS | Status: AC
  Filled 2021-02-07: qty 1000

## 2021-02-07 SURGICAL SUPPLY — 10 items
CATH OPTITORQUE TIG 4.0 5F (CATHETERS) ×1 IMPLANT
DEVICE RAD COMP TR BAND LRG (VASCULAR PRODUCTS) ×1 IMPLANT
GLIDESHEATH SLEND SS 6F .021 (SHEATH) ×1 IMPLANT
GUIDEWIRE INQWIRE 1.5J.035X260 (WIRE) IMPLANT
INQWIRE 1.5J .035X260CM (WIRE) ×2
KIT HEART LEFT (KITS) ×2 IMPLANT
PACK CARDIAC CATHETERIZATION (CUSTOM PROCEDURE TRAY) ×2 IMPLANT
SHEATH PROBE COVER 6X72 (BAG) ×1 IMPLANT
TRANSDUCER W/STOPCOCK (MISCELLANEOUS) ×2 IMPLANT
TUBING CIL FLEX 10 FLL-RA (TUBING) ×2 IMPLANT

## 2021-02-07 NOTE — Interval H&P Note (Signed)
Cath Lab Visit (complete for each Cath Lab visit)  Clinical Evaluation Leading to the Procedure:   ACS: Yes.    Non-ACS:    Anginal Classification: CCS III  Anti-ischemic medical therapy: Minimal Therapy (1 class of medications)  Non-Invasive Test Results: No non-invasive testing performed  Prior CABG: No previous CABG      History and Physical Interval Note:  02/07/2021 3:25 PM  Hannah Roy  has presented today for surgery, with the diagnosis of nstemi.  The various methods of treatment have been discussed with the patient and family. After consideration of risks, benefits and other options for treatment, the patient has consented to  Procedure(s): LEFT HEART CATH AND CORONARY ANGIOGRAPHY (N/A) as a surgical intervention.  The patient's history has been reviewed, patient examined, no change in status, stable for surgery.  I have reviewed the patient's chart and labs.  Questions were answered to the patient's satisfaction.     Shelva Majestic

## 2021-02-07 NOTE — H&P (View-Only) (Signed)
Progress Note  Patient Name: Hannah Roy Date of Encounter: 02/07/2021  Primary Cardiologist: Rozann Lesches, MD   Subjective   Patient seen examined at bedside.  She was lying in bed when I arrive-awake.  She reports that she is still experiencing some chest discomfort.  She describes it as a midsternal pressure-like sensation.  Though it feels a little better than when she first presented it still is about a 4 out of 10.  Inpatient Medications    Scheduled Meds:  amLODipine  2.5 mg Oral Daily   dofetilide  250 mcg Oral Q12H   metoprolol tartrate  25 mg Oral BID   pantoprazole  40 mg Oral QAC breakfast   potassium chloride SA  20 mEq Oral Daily   rosuvastatin  5 mg Oral Once per day on Mon Wed Fri   Continuous Infusions:  PRN Meds: acetaminophen, melatonin, nitroGLYCERIN   Vital Signs    Vitals:   02/06/21 1934 02/07/21 0017 02/07/21 0400 02/07/21 0752  BP: 103/84 99/63 95/68  117/69  Pulse: 73 60 61 62  Resp: 18 18 18 18   Temp: 98.1 F (36.7 C) 98.2 F (36.8 C) 98.1 F (36.7 C) 97.7 F (36.5 C)  TempSrc: Oral Oral Oral Oral  SpO2: 95% 96% 95% 94%  Weight:   54.8 kg   Height:        Intake/Output Summary (Last 24 hours) at 02/07/2021 0918 Last data filed at 02/06/2021 1847 Gross per 24 hour  Intake 340 ml  Output --  Net 340 ml   Filed Weights   02/05/21 1950 02/06/21 1143 02/07/21 0400  Weight: 56 kg 54 kg 54.8 kg    Telemetry    Telemetry shows sinus rhythm, heart rate 64 bpm with no alarms- Personally Reviewed  ECG    EKG done 02/05/2021 shows sinus rhythm, heart rate 68 bpm with rare PACs and underlying right bundle branch block compared to EKG done in July and June 2022 no significant change.- Personally Reviewed  Physical Exam   GEN: No acute distress.   Neck: No JVD Cardiac: RRR, no murmurs, rubs, or gallops.  Respiratory: Clear to auscultation bilaterally. GI: Soft, nontender, non-distended  MS: No edema; No deformity. Neuro:   Nonfocal  Psych: Normal affect   Labs    Chemistry Recent Labs  Lab 02/01/21 1112 02/05/21 2017 02/07/21 0253  NA 139 139 137  K 4.3 3.4* 4.7  CL 106 103 104  CO2 26 25 24   GLUCOSE 95 121* 104*  BUN 12 13 24*  CREATININE 0.84 0.76 1.04*  CALCIUM 9.2 9.6 10.1  GFRNONAA >60 >60 55*  ANIONGAP 7 11 9      Hematology Recent Labs  Lab 02/05/21 2017  WBC 4.5  RBC 3.80*  HGB 12.4  HCT 36.6  MCV 96.3  MCH 32.6  MCHC 33.9  RDW 12.2  PLT 152    Cardiac EnzymesNo results for input(s): TROPONINI in the last 168 hours. No results for input(s): TROPIPOC in the last 168 hours.   BNPNo results for input(s): BNP, PROBNP in the last 168 hours.   DDimer No results for input(s): DDIMER in the last 168 hours.   Radiology    CT Head Wo Contrast  Result Date: 02/05/2021 CLINICAL DATA:  Dizziness EXAM: CT HEAD WITHOUT CONTRAST TECHNIQUE: Contiguous axial images were obtained from the base of the skull through the vertex without intravenous contrast. COMPARISON:  10/10/2020 FINDINGS: Brain: No evidence of acute infarction, hemorrhage, hydrocephalus, extra-axial collection or mass  lesion/mass effect. Vascular: No hyperdense vessel or unexpected calcification. Skull: Normal. Negative for fracture or focal lesion. Sinuses/Orbits: The visualized paranasal sinuses are essentially clear. The mastoid air cells are unopacified. Other: None. IMPRESSION: Normal head CT. Electronically Signed   By: Julian Hy M.D.   On: 02/05/2021 21:22   DG Chest Port 1 View  Result Date: 02/05/2021 CLINICAL DATA:  Pain.  Hypertension.  Dizziness. EXAM: PORTABLE CHEST 1 VIEW COMPARISON:  Radiograph 10/10/2020 FINDINGS: Post median sternotomy. Prosthetic aortic, mitral, and tricuspid valves. Left atrial clipping. The heart is normal in size. Stable mediastinal contours. No focal airspace disease, pulmonary edema, pleural effusion or pneumothorax. No acute osseous findings are seen. IMPRESSION: 1. No acute  chest findings. 2. Post cardiac surgery. Electronically Signed   By: Keith Rake M.D.   On: 02/05/2021 20:20    Cardiac Studies   TEE January 30, 2020 IMPRESSIONS   1. Left ventricular ejection fraction, by estimation, is 55 to 60%. The  left ventricle has normal function.   2. Right ventricular systolic function is mildly reduced. The right  ventricular size is normal.   3. LA appendage surgically clipped 09/05/2018. No residual appendage  noted.. Left atrial size was severely dilated. No left atrial/left atrial  appendage thrombus was detected.   4. Right atrial size was moderately dilated.   5. Normal mitral valve bioprosthesis. DVI 1.35, EOA 2.57 cm2, iEOA 1.67  cm2/m2, PHT 81 ms. The mitral valve has been repaired/replaced. Trivial  mitral valve regurgitation. The mean mitral valve gradient is 3.0 mmHg  with average heart rate of 75 bpm.  There is a 29 mm Magna Ease bioprosthetic valve present in the mitral  position. Procedure Date: 09/05/2018.   6. The tricuspid valve is has been repaired/replaced. The tricuspid valve  is status post repair with an annuloplasty ring. Tricuspid valve  regurgitation is mild to moderate. Mean gradient 2 mmHg at HR 75 bpm.  Grossly normal appearance and function.   7. Normal aortic valve bioprosthesis. AT 60 ms, DVI 0.91, EOA 3.2 cm2,  iEOA 2.1 cm2/m2. The aortic valve has been repaired/replaced. Aortic valve  regurgitation is mild. There is a 21 mm Magna bioprosthetic valve present  in the aortic position. Procedure   Date: 09/05/2018. Aortic valve mean gradient measures 4.0 mmHg.   8. Aortic root/ascending aorta has been repaired/replaced and normal  appearance of ascending aorta repair. There is Moderate (Grade III)  atheroma plaque involving the descending aorta.   9. Agitated saline contrast bubble study was negative, with no evidence  of any interatrial shunt.   Conclusion(s)/Recommendation(s): No residual left atrial appendage, no LA   mural thrombus. Successful cardioversion performed. Normal appearance of  aortic and mitral bioprosthesis, stable tricuspid valve repair, normal  apperance of ascending aorta graft.   FINDINGS   Left Ventricle: Left ventricular ejection fraction, by estimation, is 55  to 60%. The left ventricle has normal function. The left ventricular  internal cavity size was normal in size.   Right Ventricle: The right ventricular size is normal. Right vetricular  wall thickness was not well visualized. Right ventricular systolic  function is mildly reduced.   Left Atrium: LA appendage surgically clipped 09/05/2018. No residual  appendage noted. Left atrial size was severely dilated. No left  atrial/left atrial appendage thrombus was detected.   Right Atrium: Right atrial size was moderately dilated.   Pericardium: There is no evidence of pericardial effusion.   Mitral Valve: Normal mitral valve bioprosthesis. DVI 1.35, EOA 2.57  cm2,  iEOA 1.67 cm2/m2, PHT 81 ms. The mitral valve has been repaired/replaced.  Trivial mitral valve regurgitation. There is a 29 mm Magna Ease  bioprosthetic valve present in the mitral  position. Procedure Date: 09/05/2018. MV peak gradient, 8.1 mmHg. The mean  mitral valve gradient is 3.0 mmHg with average heart rate of 75 bpm.   Tricuspid Valve: The tricuspid valve is has been repaired/replaced.  Tricuspid valve regurgitation is mild to moderate. The tricuspid valve is  status post repair with an annuloplasty ring.   Aortic Valve: Normal aortic valve bioprosthesis. AT 60 ms, DVI 0.91, EOA  3.2 cm2, iEOA 2.1 cm2/m2. The aortic valve has been repaired/replaced.  Aortic valve regurgitation is mild. Aortic valve mean gradient measures  4.0 mmHg. Aortic valve peak gradient  measures 9.4 mmHg. Aortic valve area, by VTI measures 3.17 cm. There is a  21 mm Magna bioprosthetic valve present in the aortic position. Procedure  Date: 09/05/2018.   Pulmonic Valve: The  pulmonic valve was normal in structure. Pulmonic valve  regurgitation is trivial.   Aorta: The aortic root/ascending aorta has been repaired/replaced and  normal appearance of ascending aorta repair. There is moderate (Grade III)  atheroma plaque involving the descending aorta.   IAS/Shunts: No atrial level shunt detected by color flow Doppler. Agitated  saline contrast was given intravenously to evaluate for intracardiac  shunting. Agitated saline contrast bubble study was negative, with no  evidence of any interatrial shunt.      Left heart catheterization done in May 2020 Prox LAD lesion is 40% stenosed. Mid Cx lesion is 40% stenosed. 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  Patient Profile     80 y.o. 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    Assessment & Plan    NSTEMI with known nonobstructive CAD in 2020-troponin peaked at 1124 now trending down most recent 80.  She still does have some chest discomfort.  Initial suspicion for type II demand ischemia giving her hypertensive urgency.  Pressure has resolved and has the patient has been on medical therapy however continues to experience chest discomfort.  With her known LAD disease 40% in 2020 it will be beneficial for [place] to proceed with a left heart catheterization in this patient to make sure that her CAD has not become obstructive in nature. We will continue to hold her Eliquis for now until postprocedure. Will add aspirin 81 mg daily.  I spoke with the patient today about her care plan which included planning for a left heart catheterization today.  She is agreeable.  The patient understands that risks include but are not limited to  stroke (1 in 1000), death (1 in 86), kidney failure [usually temporary] (1 in 500), bleeding (1 in 200), allergic reaction [possibly serious] (1 in 200), and agrees to proceed.  She will benefit from a echocardiogram to reassess LV function as well.  Hypertensive urgency-has resolved, continue current antihypertensive medication regimen.  Paroxysmal atrial fibrillation status post maze in 2020-in sinus rhythm.  Eliquis is being held for left heart catheterization will resume postprocedure. Continue patient on her Tikosyn.  EKG this morning is pending for monitoring of her QTC.   HDL-LDL 94.  Goal is less than 70 ideally should be less than 55.  She is on a low intensity statin with Crestor 5 mg daily.  It will be beneficial to increase to moderate intensity and repeat lipid panel before starting high intensity statins to avoid any adverse outcomes/side effects.   Rheumatic valve disease status post aortic and mitral valve replacement with tricuspid valve repair in 2020-appears to be doing well since surgery.   For questions or updates, please contact [place] Please consult [email or web address] for contact info under Cardiology/STEMI.      Signed, Berniece Salines, DO  02/07/2021, 9:18 AM

## 2021-02-07 NOTE — Progress Notes (Signed)
  Echocardiogram 2D Echocardiogram has been performed.  Merrie Roof F 02/07/2021, 2:42 PM

## 2021-02-07 NOTE — Progress Notes (Signed)
Progress Note  Patient Name: Hannah Roy Date of Encounter: 02/07/2021  Primary Cardiologist: Rozann Lesches, MD   Subjective   Patient seen examined at bedside.  She was lying in bed when I arrive-awake.  She reports that she is still experiencing some chest discomfort.  She describes it as a midsternal pressure-like sensation.  Though it feels a little better than when she first presented it still is about a 4 out of 10.  Inpatient Medications    Scheduled Meds:  amLODipine  2.5 mg Oral Daily   dofetilide  250 mcg Oral Q12H   metoprolol tartrate  25 mg Oral BID   pantoprazole  40 mg Oral QAC breakfast   potassium chloride SA  20 mEq Oral Daily   rosuvastatin  5 mg Oral Once per day on Mon Wed Fri   Continuous Infusions:  PRN Meds: acetaminophen, melatonin, nitroGLYCERIN   Vital Signs    Vitals:   02/06/21 1934 02/07/21 0017 02/07/21 0400 02/07/21 0752  BP: 103/84 99/63 95/68  117/69  Pulse: 73 60 61 62  Resp: 18 18 18 18   Temp: 98.1 F (36.7 C) 98.2 F (36.8 C) 98.1 F (36.7 C) 97.7 F (36.5 C)  TempSrc: Oral Oral Oral Oral  SpO2: 95% 96% 95% 94%  Weight:   54.8 kg   Height:        Intake/Output Summary (Last 24 hours) at 02/07/2021 0918 Last data filed at 02/06/2021 1847 Gross per 24 hour  Intake 340 ml  Output --  Net 340 ml   Filed Weights   02/05/21 1950 02/06/21 1143 02/07/21 0400  Weight: 56 kg 54 kg 54.8 kg    Telemetry    Telemetry shows sinus rhythm, heart rate 64 bpm with no alarms- Personally Reviewed  ECG    EKG done 02/05/2021 shows sinus rhythm, heart rate 68 bpm with rare PACs and underlying right bundle branch block compared to EKG done in July and June 2022 no significant change.- Personally Reviewed  Physical Exam   GEN: No acute distress.   Neck: No JVD Cardiac: RRR, no murmurs, rubs, or gallops.  Respiratory: Clear to auscultation bilaterally. GI: Soft, nontender, non-distended  MS: No edema; No deformity. Neuro:   Nonfocal  Psych: Normal affect   Labs    Chemistry Recent Labs  Lab 02/01/21 1112 02/05/21 2017 02/07/21 0253  NA 139 139 137  K 4.3 3.4* 4.7  CL 106 103 104  CO2 26 25 24   GLUCOSE 95 121* 104*  BUN 12 13 24*  CREATININE 0.84 0.76 1.04*  CALCIUM 9.2 9.6 10.1  GFRNONAA >60 >60 55*  ANIONGAP 7 11 9      Hematology Recent Labs  Lab 02/05/21 2017  WBC 4.5  RBC 3.80*  HGB 12.4  HCT 36.6  MCV 96.3  MCH 32.6  MCHC 33.9  RDW 12.2  PLT 152    Cardiac EnzymesNo results for input(s): TROPONINI in the last 168 hours. No results for input(s): TROPIPOC in the last 168 hours.   BNPNo results for input(s): BNP, PROBNP in the last 168 hours.   DDimer No results for input(s): DDIMER in the last 168 hours.   Radiology    CT Head Wo Contrast  Result Date: 02/05/2021 CLINICAL DATA:  Dizziness EXAM: CT HEAD WITHOUT CONTRAST TECHNIQUE: Contiguous axial images were obtained from the base of the skull through the vertex without intravenous contrast. COMPARISON:  10/10/2020 FINDINGS: Brain: No evidence of acute infarction, hemorrhage, hydrocephalus, extra-axial collection or mass  lesion/mass effect. Vascular: No hyperdense vessel or unexpected calcification. Skull: Normal. Negative for fracture or focal lesion. Sinuses/Orbits: The visualized paranasal sinuses are essentially clear. The mastoid air cells are unopacified. Other: None. IMPRESSION: Normal head CT. Electronically Signed   By: Julian Hy M.D.   On: 02/05/2021 21:22   DG Chest Port 1 View  Result Date: 02/05/2021 CLINICAL DATA:  Pain.  Hypertension.  Dizziness. EXAM: PORTABLE CHEST 1 VIEW COMPARISON:  Radiograph 10/10/2020 FINDINGS: Post median sternotomy. Prosthetic aortic, mitral, and tricuspid valves. Left atrial clipping. The heart is normal in size. Stable mediastinal contours. No focal airspace disease, pulmonary edema, pleural effusion or pneumothorax. No acute osseous findings are seen. IMPRESSION: 1. No acute  chest findings. 2. Post cardiac surgery. Electronically Signed   By: Keith Rake M.D.   On: 02/05/2021 20:20    Cardiac Studies   TEE January 30, 2020 IMPRESSIONS   1. Left ventricular ejection fraction, by estimation, is 55 to 60%. The  left ventricle has normal function.   2. Right ventricular systolic function is mildly reduced. The right  ventricular size is normal.   3. LA appendage surgically clipped 09/05/2018. No residual appendage  noted.. Left atrial size was severely dilated. No left atrial/left atrial  appendage thrombus was detected.   4. Right atrial size was moderately dilated.   5. Normal mitral valve bioprosthesis. DVI 1.35, EOA 2.57 cm2, iEOA 1.67  cm2/m2, PHT 81 ms. The mitral valve has been repaired/replaced. Trivial  mitral valve regurgitation. The mean mitral valve gradient is 3.0 mmHg  with average heart rate of 75 bpm.  There is a 29 mm Magna Ease bioprosthetic valve present in the mitral  position. Procedure Date: 09/05/2018.   6. The tricuspid valve is has been repaired/replaced. The tricuspid valve  is status post repair with an annuloplasty ring. Tricuspid valve  regurgitation is mild to moderate. Mean gradient 2 mmHg at HR 75 bpm.  Grossly normal appearance and function.   7. Normal aortic valve bioprosthesis. AT 60 ms, DVI 0.91, EOA 3.2 cm2,  iEOA 2.1 cm2/m2. The aortic valve has been repaired/replaced. Aortic valve  regurgitation is mild. There is a 21 mm Magna bioprosthetic valve present  in the aortic position. Procedure   Date: 09/05/2018. Aortic valve mean gradient measures 4.0 mmHg.   8. Aortic root/ascending aorta has been repaired/replaced and normal  appearance of ascending aorta repair. There is Moderate (Grade III)  atheroma plaque involving the descending aorta.   9. Agitated saline contrast bubble study was negative, with no evidence  of any interatrial shunt.   Conclusion(s)/Recommendation(s): No residual left atrial appendage, no LA   mural thrombus. Successful cardioversion performed. Normal appearance of  aortic and mitral bioprosthesis, stable tricuspid valve repair, normal  apperance of ascending aorta graft.   FINDINGS   Left Ventricle: Left ventricular ejection fraction, by estimation, is 55  to 60%. The left ventricle has normal function. The left ventricular  internal cavity size was normal in size.   Right Ventricle: The right ventricular size is normal. Right vetricular  wall thickness was not well visualized. Right ventricular systolic  function is mildly reduced.   Left Atrium: LA appendage surgically clipped 09/05/2018. No residual  appendage noted. Left atrial size was severely dilated. No left  atrial/left atrial appendage thrombus was detected.   Right Atrium: Right atrial size was moderately dilated.   Pericardium: There is no evidence of pericardial effusion.   Mitral Valve: Normal mitral valve bioprosthesis. DVI 1.35, EOA 2.57  cm2,  iEOA 1.67 cm2/m2, PHT 81 ms. The mitral valve has been repaired/replaced.  Trivial mitral valve regurgitation. There is a 29 mm Magna Ease  bioprosthetic valve present in the mitral  position. Procedure Date: 09/05/2018. MV peak gradient, 8.1 mmHg. The mean  mitral valve gradient is 3.0 mmHg with average heart rate of 75 bpm.   Tricuspid Valve: The tricuspid valve is has been repaired/replaced.  Tricuspid valve regurgitation is mild to moderate. The tricuspid valve is  status post repair with an annuloplasty ring.   Aortic Valve: Normal aortic valve bioprosthesis. AT 60 ms, DVI 0.91, EOA  3.2 cm2, iEOA 2.1 cm2/m2. The aortic valve has been repaired/replaced.  Aortic valve regurgitation is mild. Aortic valve mean gradient measures  4.0 mmHg. Aortic valve peak gradient  measures 9.4 mmHg. Aortic valve area, by VTI measures 3.17 cm. There is a  21 mm Magna bioprosthetic valve present in the aortic position. Procedure  Date: 09/05/2018.   Pulmonic Valve: The  pulmonic valve was normal in structure. Pulmonic valve  regurgitation is trivial.   Aorta: The aortic root/ascending aorta has been repaired/replaced and  normal appearance of ascending aorta repair. There is moderate (Grade III)  atheroma plaque involving the descending aorta.   IAS/Shunts: No atrial level shunt detected by color flow Doppler. Agitated  saline contrast was given intravenously to evaluate for intracardiac  shunting. Agitated saline contrast bubble study was negative, with no  evidence of any interatrial shunt.      Left heart catheterization done in May 2020 Prox LAD lesion is 40% stenosed. Mid Cx lesion is 40% stenosed. 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  Patient Profile     80 y.o. 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    Assessment & Plan    NSTEMI with known nonobstructive CAD in 2020-troponin peaked at 1124 now trending down most recent 15.  She still does have some chest discomfort.  Initial suspicion for type II demand ischemia giving her hypertensive urgency.  Pressure has resolved and has the patient has been on medical therapy however continues to experience chest discomfort.  With her known LAD disease 40% in 2020 it will be beneficial for [place] to proceed with a left heart catheterization in this patient to make sure that her CAD has not become obstructive in nature. We will continue to hold her Eliquis for now until postprocedure. Will add aspirin 81 mg daily.  I spoke with the patient today about her care plan which included planning for a left heart catheterization today.  She is agreeable.  The patient understands that risks include but are not limited to  stroke (1 in 1000), death (1 in 35), kidney failure [usually temporary] (1 in 500), bleeding (1 in 200), allergic reaction [possibly serious] (1 in 200), and agrees to proceed.  She will benefit from a echocardiogram to reassess LV function as well.  Hypertensive urgency-has resolved, continue current antihypertensive medication regimen.  Paroxysmal atrial fibrillation status post maze in 2020-in sinus rhythm.  Eliquis is being held for left heart catheterization will resume postprocedure. Continue patient on her Tikosyn.  EKG this morning is pending for monitoring of her QTC.   HDL-LDL 94.  Goal is less than 70 ideally should be less than 55.  She is on a low intensity statin with Crestor 5 mg daily.  It will be beneficial to increase to moderate intensity and repeat lipid panel before starting high intensity statins to avoid any adverse outcomes/side effects.   Rheumatic valve disease status post aortic and mitral valve replacement with tricuspid valve repair in 2020-appears to be doing well since surgery.   For questions or updates, please contact [place] Please consult [email or web address] for contact info under Cardiology/STEMI.      Signed, Berniece Salines, DO  02/07/2021, 9:18 AM

## 2021-02-08 ENCOUNTER — Encounter (HOSPITAL_COMMUNITY): Payer: Self-pay | Admitting: Cardiovascular Disease

## 2021-02-08 ENCOUNTER — Telehealth: Payer: Self-pay

## 2021-02-08 DIAGNOSIS — E785 Hyperlipidemia, unspecified: Secondary | ICD-10-CM

## 2021-02-08 LAB — BASIC METABOLIC PANEL
Anion gap: 5 (ref 5–15)
BUN: 20 mg/dL (ref 8–23)
CO2: 23 mmol/L (ref 22–32)
Calcium: 9.1 mg/dL (ref 8.9–10.3)
Chloride: 108 mmol/L (ref 98–111)
Creatinine, Ser: 0.81 mg/dL (ref 0.44–1.00)
GFR, Estimated: >60 mL/min (ref >60)
Glucose, Bld: 90 mg/dL (ref 70–99)
Potassium: 3.8 mmol/L (ref 3.5–5.1)
Sodium: 136 mmol/L (ref 135–145)

## 2021-02-08 MED ORDER — ROSUVASTATIN CALCIUM 10 MG PO TABS: 10.0000 mg | ORAL_TABLET | Freq: Every day | ORAL | 0 refills | Status: DC

## 2021-02-08 MED ORDER — NITROGLYCERIN 0.4 MG SL SUBL: 0.4000 mg | SUBLINGUAL_TABLET | SUBLINGUAL | 2 refills | Status: DC | PRN

## 2021-02-08 MED ORDER — POTASSIUM CHLORIDE CRYS ER 20 MEQ PO TBCR
40.0000 meq | EXTENDED_RELEASE_TABLET | Freq: Once | ORAL | Status: AC
  Administered 2021-02-08: 40 meq via ORAL

## 2021-02-08 NOTE — Progress Notes (Signed)
Pt discharged home IV and tele removed

## 2021-02-08 NOTE — Discharge Summary (Signed)
Discharge Summary    Patient ID: Hannah Roy MRN: [id number]; DOB: 03/02/1941  Admit date: 02/05/2021 Discharge date: 02/08/2021  PCP:  Kathyrn Drown, MD   [facility] Providers Cardiologist:  Rozann Lesches, MD     Discharge Diagnoses    Principal Problem:   NSTEMI (non-ST elevated myocardial infarction) [facility]) Active Problems:   Paroxysmal A-fib ([place])   Essential hypertension   Non-STEMI (non-ST elevated myocardial infarction) [facility])   Hyperlipidemia   Diagnostic Studies/Procedures    Cath: 02/07/21  Mid Cx lesion is 20% stenosed.   Mid LAD lesion is 20% stenosed.   Mid RCA lesion is 30% stenosed.   Mild nonobstructive CAD with mild luminal irregularities with 20% proximal LAD stenosis, 20% circumflex stenosis, and focal eccentric 30% mid RCA stenosis.   RECOMMENDATION: Eliquis was held for the procedure and can be reinstituted tomorrow.  Continue statin therapy with rosuvastatin dose increase from prior 5 mg 3 times per week to 10 mg daily with LDL at 94.  Patient is scheduled to undergo 2D echo Doppler study for further assessment of LV function and multivalve surgery including AVR, MVR, and tricuspid repair.   Dominance: Right  Echo: 02/07/21  IMPRESSIONS     1. Left ventricular ejection fraction, by estimation, is 55 to 60%. The  left ventricle has normal function. The left ventricle has no regional  wall motion abnormalities. There is mild left ventricular hypertrophy of  the basal-septal segment. Left  ventricular diastolic function could not be evaluated.   2. There is a There is a 29 mm Magna Ease bioprosthetic valve present in  the mitral position. No evidence of mitral valve stenosis. MV peak  gradient, 6.7 mmHg. The mean mitral valve gradient is 3.0 mmHg at a HR of  65bpm.   3. Right ventricular systolic function is normal. The right ventricular  size is normal.   4. Left atrial size was severely dilated.   5. Right atrial size was severely  dilated.   6. S/P Edwards MC3 Tricuspid annuloplasty ring TC3. The tricuspid valve  is has been repaired. The tricuspid valve is status post repair with an  annuloplasty ring.   7. The aortic valve has been repaired/replaced. Perivalvular Aortic valve  regurgitation is trivial. No aortic stenosis is present. There is a 21 mm  Magna Ease valve present in the aortic position. Aortic valve area, by VTI  measures 2.18 cm. Aortic  valve mean gradient measures 9.7 mmHg. Aortic valve Vmax measures 2.21  m/s.   FINDINGS   Left Ventricle: Left ventricular ejection fraction, by estimation, is 55  to 60%. The left ventricle has normal function. The left ventricle has no  regional wall motion abnormalities. The left ventricular internal cavity  size was normal in size. There is   mild left ventricular hypertrophy of the basal-septal segment. Left  ventricular diastolic function could not be evaluated due to mitral valve  replacement. Left ventricular diastolic function could not be evaluated.   Right Ventricle: The right ventricular size is normal. No increase in  right ventricular wall thickness. Right ventricular systolic function is  normal.   Left Atrium: Left atrial size was severely dilated.   Right Atrium: Right atrial size was severely dilated.   Pericardium: There is no evidence of pericardial effusion.   Mitral Valve: The mitral valve has been repaired/replaced. No evidence of  mitral valve regurgitation. There is a There is a 29 mm Magna Ease  bioprosthetic valve present in the mitral present  in the mitral position.  No evidence of mitral valve stenosis.  MV peak gradient, 6.7 mmHg. The mean mitral valve gradient is 3.0 mmHg.   Tricuspid Valve: S/P Edwards MC3 Tricuspid annuloplasty ring T28 with no  significant TV gradient. The tricuspid valve is has been  repaired/replaced. Tricuspid valve regurgitation is trivial. No evidence  of tricuspid stenosis. The tricuspid valve is   status post repair with an annuloplasty ring.   Aortic Valve: The aortic valve has been repaired/replaced. Aortic valve  regurgitation is trivial. No aortic stenosis is present. Aortic valve mean  gradient measures 9.7 mmHg. Aortic valve peak gradient measures 19.6 mmHg.  Aortic valve area, by VTI  measures 2.18 cm. There is a 21 mm Magna Ease valve present in the aortic  position.   Pulmonic Valve: The pulmonic valve was normal in structure. Pulmonic valve  regurgitation is not visualized. No evidence of pulmonic stenosis.   Aorta: The aortic root is normal in size and structure.   Venous: The inferior vena cava was not well visualized.   IAS/Shunts: No atrial level shunt detected by color flow Doppler.  _____________   History of Present Illness    Hannah Roy is a 80 y.o. 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 Hannah Roy presented to [facility] with complaints of chest pressure, dizziness, and right arm weakness.  She reported prior to this she had a significant spike in her blood pressures up to 220s/90s which was very out of the ordinary.  She repeated this and remained elevated > 200s/90s.  She reported taking her regular medicines as prescribed with the exception of amlodipine which she increased to 5 mg.  When her symptoms did not resolve she presented to the hospital for further evaluation.   She has an extensive cardiac history.  She was last evaluated by cardiology and an outpatient visit with Roderic Palau 02/01/2021 in the A. fib clinic.  She was without cardiac complaints at that time and was noted to be in sinus rhythm with stable QTC.  No medication changes occurred.  To that she was seen by Dr. Domenic Polite 10/2020 and  again was doing well from a cardiac standpoint.  Her last echocardiogram was a TEE 01/30/2020 which showed EF 55 to 60%, normal LV diastolic function, mildly reduced RV systolic function, severe LAE s/p LAA clip, moderate RAE, with stable bioprosthetic mitral and aortic valves and TR repair.  Last ischemic evaluation was a [facility] 08/2018 which showed 40% proximal LAD and 40% mid LCx stenosis.   Hospital course: BP elevated to 183/99 on presentation, improved with continuation of home medications, otherwise VSS.  Labs notable for K 3.4, CR 0.7, CBC within normal limits, HsTrop [id number]. EKG showed sinus rhythm, rate 70 bpm, chronic RBBB, nonspecific T wave abnormalities; unchanged from previous.  CXR showed no acute findings.  No acute findings.   At the time of  evaluation she was feeling essentially back to normal.  She reported her chest tightness resolved early this morning.  Her right arm weakness has resolved.  Prior to this she was not having any cardiac complaints.  She has not had any symptoms concerning for recurrent atrial fibrillation.  She denied any recent shortness of breath, orthopnea, lower extremity edema, syncope, palpitations.  She  was transferred to [facility] for further management.   Hospital Course     NSTEMI with known nonobstructive CAD in 2020: hsTn peaked a 1124. Underwent heart catheterization 10/31 which showed mild nonobstructive disease with no need for any intervention.  Recommendations for medical therapy.  Echocardiogram showed normal LVEF with stable bioprosthetic valve in the aortic and mitral position. -- Dr. Harriet Masson discussed with the patient the potential use of aspirin for right now she rather hold off and speak with her primary cardiologist about this at follow up -- Continue her rosuvastatin 10 mg daily.  Hypertensive urgency: Improved throughout admission. -- Continue metoprolol 25 mg twice daily, Norvasc 2.5 mg daily   Paroxysmal atrial fibrillation status post maze  in 2020: Maintained sinus rhythm.   -- Resumed on Eliquis 5 mg twice daily post cath -- continued on Tikosyn 250 mcg twice daily   HDL: LDL 94.   -- She was transitioned from Crestor 5 mg 3 times a week, to Crestor 10 mg daily -- We will need LFTs/FLP in 8 weeks  Rheumatic valve disease status post aortic and mitral valve replacement: with tricuspid valve repair in 2020-appears to be doing well since surgery.   -- Echocardiogram 10/31 with stable valve position, no stenosis or valvular leak  Patient was seen by Dr. Harriet Masson and deemed stable for discharge home. Follow up in the office has been arranged. Medications sent to Pharmacy of choice prior to discharge.   Did the patient have an acute coronary syndrome (MI, NSTEMI, STEMI, etc) this admission?:  No                               Did the patient have a percutaneous coronary intervention (stent / angioplasty)?:  No.      _____________  Discharge Vitals Blood pressure 100/65, pulse 66, temperature 97.9 F (36.6 C), temperature source Oral, resp. rate 18, height 5\' 1"  (1.549 m), weight 57.3 kg, SpO2 97 %.  Filed Weights   02/06/21 1143 02/07/21 0400 02/08/21 0600  Weight: 54 kg 54.8 kg 57.3 kg    Labs & Radiologic Studies    CBC Recent Labs    02/05/21 2017  WBC 4.5  NEUTROABS 2.7  HGB 12.4  HCT 36.6  MCV 96.3  PLT 188   Basic Metabolic Panel Recent Labs    02/07/21 0253 02/08/21 0326  NA 137 136  K 4.7 3.8  CL 104 108  CO2 24 23  GLUCOSE 104* 90  BUN 24* 20  CREATININE 1.04* 0.81  CALCIUM 10.1 9.1   Liver Function Tests No results for input(s): AST, ALT, ALKPHOS, BILITOT, PROT, ALBUMIN in the last 72 hours. No results for input(s): LIPASE, AMYLASE in the last 72 hours. High Sensitivity Troponin:   Recent Labs  Lab 02/05/21 2017 02/05/21 2214 02/06/21 1512 02/06/21 1646  TROPONINIHS 296* 1,124* 480* 447*    BNP Invalid input(s): POCBNP D-Dimer No results for input(s): DDIMER in the last 72  hours. Hemoglobin A1C Recent Labs    02/07/21 0253  HGBA1C 5.2   Fasting Lipid Panel Recent Labs    02/07/21 0253  CHOL 167  HDL 53  LDLCALC 94  TRIG 102  CHOLHDL 3.2   Thyroid Function Tests Recent Labs    02/07/21 0253  TSH 4.320   _____________  CT Head Wo Contrast  Result Date: 02/05/2021 CLINICAL DATA:  Dizziness EXAM: CT HEAD WITHOUT CONTRAST TECHNIQUE: Contiguous axial images were obtained  from the base of the skull through the vertex without intravenous contrast. COMPARISON:  10/10/2020 FINDINGS: Brain: No evidence of acute infarction, hemorrhage, hydrocephalus, extra-axial collection or mass lesion/mass effect. Vascular: No hyperdense vessel or unexpected calcification. Skull: Normal. Negative for fracture or focal lesion. Sinuses/Orbits: The visualized paranasal sinuses are essentially clear. The mastoid air cells are unopacified. Other: None. IMPRESSION: Normal head CT. Electronically Signed   By: Julian Hy M.D.   On: 02/05/2021 21:22   CARDIAC CATHETERIZATION  Result Date: 02/07/2021   Mid Cx lesion is 20% stenosed.   Mid LAD lesion is 20% stenosed.   Mid RCA lesion is 30% stenosed. Mild nonobstructive CAD with mild luminal irregularities with 20% proximal LAD stenosis, 20% circumflex stenosis, and focal eccentric 30% mid RCA stenosis. RECOMMENDATION: Eliquis was held for the procedure and can be reinstituted tomorrow.  Continue statin therapy with rosuvastatin dose increase from prior 5 mg 3 times per week to 10 mg daily with LDL at 94.  Patient is scheduled to undergo 2D echo Doppler study for further assessment of LV function and multivalve surgery including AVR, MVR, and tricuspid repair.  DG Chest Port 1 View  Result Date: 02/05/2021 CLINICAL DATA:  Pain.  Hypertension.  Dizziness. EXAM: PORTABLE CHEST 1 VIEW COMPARISON:  Radiograph 10/10/2020 FINDINGS: Post median sternotomy. Prosthetic aortic, mitral, and tricuspid valves. Left atrial clipping. The  heart is normal in size. Stable mediastinal contours. No focal airspace disease, pulmonary edema, pleural effusion or pneumothorax. No acute osseous findings are seen. IMPRESSION: 1. No acute chest findings. 2. Post cardiac surgery. Electronically Signed   By: Keith Rake M.D.   On: 02/05/2021 20:20   MM 3D SCREEN BREAST BILATERAL  Result Date: 01/21/2021 CLINICAL DATA:  Screening. EXAM: DIGITAL SCREENING BILATERAL MAMMOGRAM WITH TOMOSYNTHESIS AND CAD TECHNIQUE: Bilateral screening digital craniocaudal and mediolateral oblique mammograms were obtained. Bilateral screening digital breast tomosynthesis was performed. The images were evaluated with computer-aided detection. COMPARISON:  Previous exam(s). ACR Breast Density Category b: There are scattered areas of fibroglandular density. FINDINGS: There are no findings suspicious for malignancy. IMPRESSION: No mammographic evidence of malignancy. A result letter of this screening mammogram will be mailed directly to the patient. RECOMMENDATION: Screening mammogram in one year. (Code:SM-B-01Y) BI-RADS CATEGORY  1: Negative. Electronically Signed   By: Ileana Roup M.D.   On: 01/21/2021 08:03   ECHOCARDIOGRAM COMPLETE  Result Date: 02/07/2021    ECHOCARDIOGRAM REPORT   Patient Name:   DELIA SLATTEN Date of Exam: 02/07/2021 Medical Rec #:  [id number]   Height:       61.0 in Accession #:    [id number]  Weight:       120.8 lb Date of Birth:  [birth year]   BSA:          1.525 m Patient Age:    73 years    BP:           117/69 mmHg Patient Gender: F           HR:           66 bpm. Exam Location:  Inpatient Procedure: 2D Echo, Cardiac Doppler and Color Doppler Indications:    CHF  History:        Patient has prior history of Echocardiogram examinations, most                 recent 01/28/2021. Aortic Valve Disease, Mitral Valve Disease  and TV Disease, Arrythmias:Atrial Fibrillation; Risk                 Factors:Hypertension and Former Smoker.                  Aortic Valve: 21 mm Magna Ease valve is present in the aortic                 position.  Sonographer:    Merrie Roof RDCS Referring Phys: [id number] [place]  1. Left ventricular ejection fraction, by estimation, is 55 to 60%. The left ventricle has normal function. The left ventricle has no regional wall motion abnormalities. There is mild left ventricular hypertrophy of the basal-septal segment. Left ventricular diastolic function could not be evaluated.  2. There is a There is a 29 mm Magna Ease bioprosthetic valve present in the mitral position. No evidence of mitral valve stenosis. MV peak gradient, 6.7 mmHg. The mean mitral valve gradient is 3.0 mmHg at a HR of 65bpm.  3. Right ventricular systolic function is normal. The right ventricular size is normal.  4. Left atrial size was severely dilated.  5. Right atrial size was severely dilated.  6. S/P Edwards MC3 Tricuspid annuloplasty ring TC3. The tricuspid valve is has been repaired. The tricuspid valve is status post repair with an annuloplasty ring.  7. The aortic valve has been repaired/replaced. Perivalvular Aortic valve regurgitation is trivial. No aortic stenosis is present. There is a 21 mm Magna Ease valve present in the aortic position. Aortic valve area, by VTI measures 2.18 cm. Aortic valve mean gradient measures 9.7 mmHg. Aortic valve Vmax measures 2.21 m/s. FINDINGS  Left Ventricle: Left ventricular ejection fraction, by estimation, is 55 to 60%. The left ventricle has normal function. The left ventricle has no regional wall motion abnormalities. The left ventricular internal cavity size was normal in size. There is  mild left ventricular hypertrophy of the basal-septal segment. Left ventricular diastolic function could not be evaluated due to mitral valve replacement. Left ventricular diastolic function could not be evaluated. Right Ventricle: The right ventricular size is normal. No increase in right ventricular wall  thickness. Right ventricular systolic function is normal. Left Atrium: Left atrial size was severely dilated. Right Atrium: Right atrial size was severely dilated. Pericardium: There is no evidence of pericardial effusion. Mitral Valve: The mitral valve has been repaired/replaced. No evidence of mitral valve regurgitation. There is a There is a 29 mm Magna Ease bioprosthetic valve present in the mitral present in the mitral position. No evidence of mitral valve stenosis. MV peak gradient, 6.7 mmHg. The mean mitral valve gradient is 3.0 mmHg. Tricuspid Valve: S/P Edwards MC3 Tricuspid annuloplasty ring T28 with no significant TV gradient. The tricuspid valve is has been repaired/replaced. Tricuspid valve regurgitation is trivial. No evidence of tricuspid stenosis. The tricuspid valve is status post repair with an annuloplasty ring. Aortic Valve: The aortic valve has been repaired/replaced. Aortic valve regurgitation is trivial. No aortic stenosis is present. Aortic valve mean gradient measures 9.7 mmHg. Aortic valve peak gradient measures 19.6 mmHg. Aortic valve area, by VTI measures 2.18 cm. There is a 21 mm Magna Ease valve present in the aortic position. Pulmonic Valve: The pulmonic valve was normal in structure. Pulmonic valve regurgitation is not visualized. No evidence of pulmonic stenosis. Aorta: The aortic root is normal in size and structure. Venous: The inferior vena cava was not well visualized. IAS/Shunts: No atrial level shunt detected by color flow Doppler.  LEFT VENTRICLE PLAX 2D LVIDd:         4.30 cm LVIDs:         3.10 cm LV PW:         1.10 cm LV IVS:        1.20 cm LVOT diam:     1.80 cm LV SV:         92 LV SV Index:   61 LVOT Area:     2.54 cm  RIGHT VENTRICLE RV Basal diam:  3.70 cm LEFT ATRIUM           Index        RIGHT ATRIUM           Index LA diam:      4.90 cm 3.21 cm/m   RA Area:     22.60 cm LA Vol (A2C): 46.0 ml 30.17 ml/m  RA Volume:   76.90 ml  50.44 ml/m LA Vol (A4C): 93.6 ml  61.40 ml/m  AORTIC VALVE AV Area (Vmax):    2.09 cm AV Area (Vmean):   2.04 cm AV Area (VTI):     2.18 cm AV Vmax:           221.33 cm/s AV Vmean:          140.333 cm/s AV VTI:            0.423 m AV Peak Grad:      19.6 mmHg AV Mean Grad:      9.7 mmHg LVOT Vmax:         181.50 cm/s LVOT Vmean:        112.500 cm/s LVOT VTI:          0.363 m LVOT/AV VTI ratio: 0.86  AORTA Ao Root diam: 2.70 cm Ao Asc diam:  2.40 cm MITRAL VALVE                TRICUSPID VALVE MV Area (PHT): 3.39 cm     TR Peak grad:   14.9 mmHg MV Area VTI:   2.50 cm     TR Vmax:        193.00 cm/s MV Peak grad:  6.7 mmHg MV Mean grad:  3.0 mmHg     SHUNTS MV Vmax:       1.29 m/s     Systemic VTI:  0.36 m MV Vmean:      75.9 cm/s    Systemic Diam: 1.80 cm MV Decel Time: 224 msec MV E velocity: 105.00 cm/s MV A velocity: 57.40 cm/s MV E/A ratio:  1.83 Fransico Him MD Electronically signed by Fransico Him MD Signature Date/Time: 02/07/2021/3:15:48 PM    Final    Disposition   Pt is being discharged home today in good condition.  Follow-up Plans & Appointments     Follow-up Information     Kathyrn Drown, MD. Go on 02/17/2021.   Specialty: Family Medicine Why: @11 :20am Contact information: [place] [address] [phone]         Erma Heritage, PA-C Follow up on 03/01/2021.   Specialties: Physician Assistant, Cardiology Why: at @ 2:30pm for your follow up appt with  Dr. Myles Gip PA Contact information: [place] [address] [phone]                  Discharge Medications   Allergies as of 02/08/2021       Reactions   Bee Venom Swelling, Other (See Comments)  Welts, also   Other Other (See Comments)   ANY PAIN MEDS MUST, MUST BE PRECEDED BY AN ANTIEMETIC!!!!    Boniva [ibandronic Acid] Nausea And Vomiting, Other (See Comments)   Upsets the stomach and flu-like symptoms    Lisinopril Cough   Latex Rash        Medication List     TAKE these  medications    acetaminophen 325 MG tablet Commonly known as: TYLENOL Take 1-2 tablets (325-650 mg total) by mouth every 4 (four) hours as needed for mild pain.   amLODipine 2.5 MG tablet Commonly known as: NORVASC Take 1 tablet (2.5 mg total) by mouth daily.   amoxicillin 500 MG tablet Commonly known as: AMOXIL Takes 4 tablets as needed prior to dental procedures   apixaban 5 MG Tabs tablet Commonly known as: Eliquis Take 1 tablet (5 mg total) by mouth 2 (two) times daily.   dofetilide 250 MCG capsule Commonly known as: TIKOSYN Take 1 capsule (250 mcg total) by mouth 2 (two) times daily.   metoprolol tartrate 25 MG tablet Commonly known as: LOPRESSOR TAKE ONE TABLET (25MG  TOTAL) BY MOUTH TWO TIMES DAILY What changed:  how much to take how to take this when to take this additional instructions   nitroGLYCERIN 0.4 MG SL tablet Commonly known as: NITROSTAT Place 1 tablet (0.4 mg total) under the tongue every 5 (five) minutes x 3 doses as needed for chest pain.   pantoprazole 40 MG tablet Commonly known as: PROTONIX Take 1 tablet (40 mg total) by mouth daily before breakfast.   potassium chloride 10 MEQ tablet Commonly known as: KLOR-CON Take 2 tablets (20 mEq total) by mouth daily.   rosuvastatin 10 MG tablet Commonly known as: CRESTOR Take 1 tablet (10 mg total) by mouth daily. What changed:  medication strength See the new instructions.   senna-docusate 8.6-50 MG tablet Commonly known as: Senokot-S Take 2 tablets by mouth at bedtime. What changed:  how much to take when to take this reasons to take this   valACYclovir 1000 MG tablet Commonly known as: VALTREX 2 pills bid times one day as needed for fever blisters What changed:  how much to take how to take this when to take this reasons to take this additional instructions   vitamin C 100 MG tablet Take 100 mg by mouth daily.         Outstanding Labs/Studies   FLP/LFTs in 8 weeks    Duration of Discharge Encounter   Greater than 30 minutes including physician time.  Signed, Reino Bellis, NP 02/08/2021, 10:32 AM

## 2021-02-08 NOTE — Telephone Encounter (Signed)
Transition Care Management Follow-up Telephone Call Date of discharge and from where: 02/08/21 [place] Diagnosis: NSTEMI How have you been since you were released from the hospital? Pt states she is doing well.  Any questions or concerns? No  Items Reviewed: Did the pt receive and understand the discharge instructions provided? Yes  Medications obtained and verified? Yes  Other? No  Any new allergies since your discharge? No  Dietary orders reviewed? Yes Do you have support at home? Yes   Home Care and Equipment/Supplies: Were home health services ordered? no If so, what is the name of the agency?   Has the agency set up a time to come to the patient's home? not applicable Were any new equipment or medical supplies ordered?  No What is the name of the medical supply agency?  Were you able to get the supplies/equipment? not applicable Do you have any questions related to the use of the equipment or supplies? No  Functional Questionnaire: (I = Independent and D = Dependent) ADLs: I  Bathing/Dressing- I  Meal Prep- I  Eating- I  Maintaining continence- I  Transferring/Ambulation- I  Managing Meds- I  Follow up appointments reviewed:  PCP Hospital f/u appt confirmed? Yes  Scheduled to see Dr. Wolfgang Phoenix on 02/18/21 @ 11:20. [place] f/u appt confirmed? Yes  Scheduled to see Bernerd Pho, [place] on 03/01/21 @ 2:30. Are transportation arrangements needed? No  If their condition worsens, is the pt aware to call PCP or go to the Emergency Dept.? Y Was the patient provided with contact information for the PCP's office or ED? Yes Was to pt encouraged to call back with questions or concerns? Yes

## 2021-02-08 NOTE — Progress Notes (Signed)
Progress Note  Patient Name: Hannah Roy Date of Encounter: 02/08/2021  Primary Cardiologist: Rozann Lesches, MD   Subjective   Patient seen examined at her bedside.  She was sitting up when I arrived.  She has no complaints.  She is very anxious about going home and has a lot of questions about her heart health.  Sinus rhythm, heart rate  Inpatient Medications    Scheduled Meds:  amLODipine  2.5 mg Oral Daily   aspirin  81 mg Oral Daily   dofetilide  250 mcg Oral Q12H   metoprolol tartrate  25 mg Oral BID   pantoprazole  40 mg Oral QAC breakfast   potassium chloride SA  20 mEq Oral Daily   potassium chloride  40 mEq Oral Once   [START ON 02/09/2021] rosuvastatin  10 mg Oral Once per day on Mon Wed Fri   sodium chloride flush  3 mL Intravenous Q12H   Continuous Infusions:  sodium chloride     PRN Meds: sodium chloride, acetaminophen, melatonin, nitroGLYCERIN, ondansetron (ZOFRAN) IV, sodium chloride flush   Vital Signs    Vitals:   02/07/21 2251 02/08/21 0014 02/08/21 0420 02/08/21 0600  BP: 92/62 100/67 100/65   Pulse: 67 68 66   Resp:  16 18   Temp:  98.9 F (37.2 C) 97.9 F (36.6 C)   TempSrc:   Oral   SpO2:  97% 97%   Weight:    57.3 kg  Height:        Intake/Output Summary (Last 24 hours) at 02/08/2021 0956 Last data filed at 02/08/2021 0300 Gross per 24 hour  Intake 485.03 ml  Output 1000 ml  Net -514.97 ml   Filed Weights   02/06/21 1143 02/07/21 0400 02/08/21 0600  Weight: 54 kg 54.8 kg 57.3 kg    Telemetry    Sinus rhythm heart rate between 64 and 67- Personally Reviewed  ECG    Sinus rhythm, heart rate 70 bpm with underlying right bundle branch block and QTC 483ms, compared to yesterday's EKG QTC has improved at that time and was 510 ms.  Personally Reviewed  Physical Exam   GEN: No acute distress.   Neck: No JVD Cardiac: RRR, no murmurs, rubs, or gallops.  Respiratory: Clear to auscultation bilaterally. GI: Soft, nontender,  non-distended  MS: No edema; No deformity. Neuro:  Nonfocal  Psych: Normal affect   Labs    Chemistry Recent Labs  Lab 02/05/21 2017 02/07/21 0253 02/08/21 0326  NA 139 137 136  K 3.4* 4.7 3.8  CL 103 104 108  CO2 25 24 23   GLUCOSE 121* 104* 90  BUN 13 24* 20  CREATININE 0.76 1.04* 0.81  CALCIUM 9.6 10.1 9.1  GFRNONAA >60 55* >60  ANIONGAP 11 9 5      Hematology Recent Labs  Lab 02/05/21 2017  WBC 4.5  RBC 3.80*  HGB 12.4  HCT 36.6  MCV 96.3  MCH 32.6  MCHC 33.9  RDW 12.2  PLT 152    Cardiac EnzymesNo results for input(s): TROPONINI in the last 168 hours. No results for input(s): TROPIPOC in the last 168 hours.   BNPNo results for input(s): BNP, PROBNP in the last 168 hours.   DDimer No results for input(s): DDIMER in the last 168 hours.   Radiology    CARDIAC CATHETERIZATION  Result Date: 02/07/2021   Mid Cx lesion is 20% stenosed.   Mid LAD lesion is 20% stenosed.   Mid RCA lesion is 30%  stenosed. Mild nonobstructive CAD with mild luminal irregularities with 20% proximal LAD stenosis, 20% circumflex stenosis, and focal eccentric 30% mid RCA stenosis. RECOMMENDATION: Eliquis was held for the procedure and can be reinstituted tomorrow.  Continue statin therapy with rosuvastatin dose increase from prior 5 mg 3 times per week to 10 mg daily with LDL at 94.  Patient is scheduled to undergo 2D echo Doppler study for further assessment of LV function and multivalve surgery including AVR, MVR, and tricuspid repair.  ECHOCARDIOGRAM COMPLETE  Result Date: 02/07/2021    ECHOCARDIOGRAM REPORT   Patient Name:   Hannah Roy Date of Exam: 02/07/2021 Medical Rec #:  [id number]   Height:       61.0 in Accession #:    [id number]  Weight:       120.8 lb Date of Birth:  1940/05/25   BSA:          1.525 m Patient Age:    80 years    BP:           117/69 mmHg Patient Gender: F           HR:           66 bpm. Exam Location:  Inpatient Procedure: 2D Echo, Cardiac Doppler and Color  Doppler Indications:    CHF  History:        Patient has prior history of Echocardiogram examinations, most                 recent 01/28/2021. Aortic Valve Disease, Mitral Valve Disease                 and TV Disease, Arrythmias:Atrial Fibrillation; Risk                 Factors:Hypertension and Former Smoker.                 Aortic Valve: 21 mm Magna Ease valve is present in the aortic                 position.  Sonographer:    Merrie Roof RDCS Referring Phys: [id number] [place]  1. Left ventricular ejection fraction, by estimation, is 55 to 60%. The left ventricle has normal function. The left ventricle has no regional wall motion abnormalities. There is mild left ventricular hypertrophy of the basal-septal segment. Left ventricular diastolic function could not be evaluated.  2. There is a There is a 29 mm Magna Ease bioprosthetic valve present in the mitral position. No evidence of mitral valve stenosis. MV peak gradient, 6.7 mmHg. The mean mitral valve gradient is 3.0 mmHg at a HR of 65bpm.  3. Right ventricular systolic function is normal. The right ventricular size is normal.  4. Left atrial size was severely dilated.  5. Right atrial size was severely dilated.  6. S/P Edwards MC3 Tricuspid annuloplasty ring TC3. The tricuspid valve is has been repaired. The tricuspid valve is status post repair with an annuloplasty ring.  7. The aortic valve has been repaired/replaced. Perivalvular Aortic valve regurgitation is trivial. No aortic stenosis is present. There is a 21 mm Magna Ease valve present in the aortic position. Aortic valve area, by VTI measures 2.18 cm. Aortic valve mean gradient measures 9.7 mmHg. Aortic valve Vmax measures 2.21 m/s. FINDINGS  Left Ventricle: Left ventricular ejection fraction, by estimation, is 55 to 60%. The left ventricle has normal function. The left ventricle has no regional wall motion abnormalities. The left ventricular internal  cavity size was normal in size. There  is  mild left ventricular hypertrophy of the basal-septal segment. Left ventricular diastolic function could not be evaluated due to mitral valve replacement. Left ventricular diastolic function could not be evaluated. Right Ventricle: The right ventricular size is normal. No increase in right ventricular wall thickness. Right ventricular systolic function is normal. Left Atrium: Left atrial size was severely dilated. Right Atrium: Right atrial size was severely dilated. Pericardium: There is no evidence of pericardial effusion. Mitral Valve: The mitral valve has been repaired/replaced. No evidence of mitral valve regurgitation. There is a There is a 29 mm Magna Ease bioprosthetic valve present in the mitral present in the mitral position. No evidence of mitral valve stenosis. MV peak gradient, 6.7 mmHg. The mean mitral valve gradient is 3.0 mmHg. Tricuspid Valve: S/P Edwards MC3 Tricuspid annuloplasty ring T28 with no significant TV gradient. The tricuspid valve is has been repaired/replaced. Tricuspid valve regurgitation is trivial. No evidence of tricuspid stenosis. The tricuspid valve is status post repair with an annuloplasty ring. Aortic Valve: The aortic valve has been repaired/replaced. Aortic valve regurgitation is trivial. No aortic stenosis is present. Aortic valve mean gradient measures 9.7 mmHg. Aortic valve peak gradient measures 19.6 mmHg. Aortic valve area, by VTI measures 2.18 cm. There is a 21 mm Magna Ease valve present in the aortic position. Pulmonic Valve: The pulmonic valve was normal in structure. Pulmonic valve regurgitation is not visualized. No evidence of pulmonic stenosis. Aorta: The aortic root is normal in size and structure. Venous: The inferior vena cava was not well visualized. IAS/Shunts: No atrial level shunt detected by color flow Doppler.  LEFT VENTRICLE PLAX 2D LVIDd:         4.30 cm LVIDs:         3.10 cm LV PW:         1.10 cm LV IVS:        1.20 cm LVOT diam:     1.80 cm LV  SV:         92 LV SV Index:   61 LVOT Area:     2.54 cm  RIGHT VENTRICLE RV Basal diam:  3.70 cm LEFT ATRIUM           Index        RIGHT ATRIUM           Index LA diam:      4.90 cm 3.21 cm/m   RA Area:     22.60 cm LA Vol (A2C): 46.0 ml 30.17 ml/m  RA Volume:   76.90 ml  50.44 ml/m LA Vol (A4C): 93.6 ml 61.40 ml/m  AORTIC VALVE AV Area (Vmax):    2.09 cm AV Area (Vmean):   2.04 cm AV Area (VTI):     2.18 cm AV Vmax:           221.33 cm/s AV Vmean:          140.333 cm/s AV VTI:            0.423 m AV Peak Grad:      19.6 mmHg AV Mean Grad:      9.7 mmHg LVOT Vmax:         181.50 cm/s LVOT Vmean:        112.500 cm/s LVOT VTI:          0.363 m LVOT/AV VTI ratio: 0.86  AORTA Ao Root diam: 2.70 cm Ao Asc diam:  2.40 cm MITRAL VALVE  TRICUSPID VALVE MV Area (PHT): 3.39 cm     TR Peak grad:   14.9 mmHg MV Area VTI:   2.50 cm     TR Vmax:        193.00 cm/s MV Peak grad:  6.7 mmHg MV Mean grad:  3.0 mmHg     SHUNTS MV Vmax:       1.29 m/s     Systemic VTI:  0.36 m MV Vmean:      75.9 cm/s    Systemic Diam: 1.80 cm MV Decel Time: 224 msec MV E velocity: 105.00 cm/s MV A velocity: 57.40 cm/s MV E/A ratio:  1.83 Fransico Him MD Electronically signed by Fransico Him MD Signature Date/Time: 02/07/2021/3:15:48 PM    Final     Cardiac Studies   Left heart catheterization 02/07/2021  Mid Cx lesion is 20% stenosed.   Mid LAD lesion is 20% stenosed.   Mid RCA lesion is 30% stenosed.   Mild nonobstructive CAD with mild luminal irregularities with 20% proximal LAD stenosis, 20% circumflex stenosis, and focal eccentric 30% mid RCA stenosis. RECOMMENDATION: Eliquis was held for the procedure and can be reinstituted tomorrow.  Continue statin therapy with rosuvastatin dose increase from prior 5 mg 3 times per week to 10 mg daily with LDL at 94.  Patient is scheduled to undergo 2D echo Doppler study for further assessment of LV function and multivalve surgery including AVR, MVR, and tricuspid  repair.   Echo 02/07/2021 IMPRESSIONS   1. Left ventricular ejection fraction, by estimation, is 55 to 60%. The  left ventricle has normal function. The left ventricle has no regional  wall motion abnormalities. There is mild left ventricular hypertrophy of  the basal-septal segment. Left  ventricular diastolic function could not be evaluated.   2. There is a There is a 29 mm Magna Ease bioprosthetic valve present in  the mitral position. No evidence of mitral valve stenosis. MV peak  gradient, 6.7 mmHg. The mean mitral valve gradient is 3.0 mmHg at a HR of  65bpm.   3. Right ventricular systolic function is normal. The right ventricular  size is normal.   4. Left atrial size was severely dilated.   5. Right atrial size was severely dilated.   6. S/P Edwards MC3 Tricuspid annuloplasty ring TC3. The tricuspid valve  is has been repaired. The tricuspid valve is status post repair with an  annuloplasty ring.   7. The aortic valve has been repaired/replaced. Perivalvular Aortic valve  regurgitation is trivial. No aortic stenosis is present. There is a 21 mm  Magna Ease valve present in the aortic position. Aortic valve area, by VTI  measures 2.18 cm. Aortic  valve mean gradient measures 9.7 mmHg. Aortic valve Vmax measures 2.21  m/s.   FINDINGS   Left Ventricle: Left ventricular ejection fraction, by estimation, is 55  to 60%. The left ventricle has normal function. The left ventricle has no  regional wall motion abnormalities. The left ventricular internal cavity  size was normal in size. There is   mild left ventricular hypertrophy of the basal-septal segment. Left  ventricular diastolic function could not be evaluated due to mitral valve  replacement. Left ventricular diastolic function could not be evaluated.   Right Ventricle: The right ventricular size is normal. No increase in  right ventricular wall thickness. Right ventricular systolic function is  normal.   Left Atrium:  Left atrial size was severely dilated.   Right Atrium: Right atrial size was severely  dilated.   Pericardium: There is no evidence of pericardial effusion.   Mitral Valve: The mitral valve has been repaired/replaced. No evidence of  mitral valve regurgitation. There is a There is a 29 mm Magna Ease  bioprosthetic valve present in the mitral present in the mitral position.  No evidence of mitral valve stenosis.  MV peak gradient, 6.7 mmHg. The mean mitral valve gradient is 3.0 mmHg.   Tricuspid Valve: S/P Edwards MC3 Tricuspid annuloplasty ring T28 with no  significant TV gradient. The tricuspid valve is has been  repaired/replaced. Tricuspid valve regurgitation is trivial. No evidence  of tricuspid stenosis. The tricuspid valve is  status post repair with an annuloplasty ring.   Aortic Valve: The aortic valve has been repaired/replaced. Aortic valve  regurgitation is trivial. No aortic stenosis is present. Aortic valve mean  gradient measures 9.7 mmHg. Aortic valve peak gradient measures 19.6 mmHg.  Aortic valve area, by VTI  measures 2.18 cm. There is a 21 mm Magna Ease valve present in the aortic  position.   Pulmonic Valve: The pulmonic valve was normal in structure. Pulmonic valve  regurgitation is not visualized. No evidence of pulmonic stenosis.   Aorta: The aortic root is normal in size and structure.   Venous: The inferior vena cava was not well visualized.   IAS/Shunts: No atrial level shunt detected by color flow Doppler.      Patient Profile     80 y.o. 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  Assessment & Plan    NSTEMI with known nonobstructive CAD in 2020-she is now status post left heart catheterization yesterday which  showed mild nonobstructive disease with no need for any intervention.  We will continue with medical therapy.  I discussed with the patient the potential use of aspirin for right now she rather hold off and speak with her primary cardiologist about this and I think this is unreasonable given her mild disease.   Continue her rosuvastatin 10 mg daily.   Her echocardiogram was done yesterday which showed normal LVEF with functioning bioprosthesis in the aortic and mitral position.  Hypertensive urgency-has resolved, continue current antihypertensive medication regimen.   Paroxysmal atrial fibrillation status post maze in 2020-in sinus rhythm.  Please restart her Eliquis this morning.  Continue patient on her Tikosyn.  EKG this morning review with improved QTC compared to yesterday 43 ms today.  Yesterday was 510 ms.    HDL-LDL 94.  Goal is less than 70 ideally should be less than 55.  We transition the patient yesterday to Crestor 10 mg daily.  Rheumatic valve disease status post aortic and mitral valve replacement with tricuspid valve repair in 2020-appears to be doing well since surgery.  Echocardiogram done yesterday shows that all of her valves are well-seated with no evidence of stenosis or regurgitation or paravalvular leak.  Plan to discharge the patient home today.  Discussed her medication regimen with her.  She is going to follow with her primary cardiologist within the next 2 weeks.  For questions or updates, please contact [facility] Please consult [email or web address] for contact info under Cardiology/STEMI.      Signed, Berniece Salines, DO  02/08/2021, 9:56 AM

## 2021-02-17 ENCOUNTER — Inpatient Hospital Stay: Payer: Medicare Other | Admitting: Family Medicine

## 2021-02-18 ENCOUNTER — Other Ambulatory Visit: Payer: Self-pay

## 2021-02-18 ENCOUNTER — Ambulatory Visit (INDEPENDENT_AMBULATORY_CARE_PROVIDER_SITE_OTHER): Payer: Medicare Other | Admitting: Family Medicine

## 2021-02-18 ENCOUNTER — Encounter: Payer: Self-pay | Admitting: Family Medicine

## 2021-02-18 VITALS — BP 118/70 | Temp 97.1°F | Wt 123.6 lb

## 2021-02-18 DIAGNOSIS — N289 Disorder of kidney and ureter, unspecified: Secondary | ICD-10-CM

## 2021-02-18 DIAGNOSIS — D509 Iron deficiency anemia, unspecified: Secondary | ICD-10-CM | POA: Diagnosis not present

## 2021-02-18 DIAGNOSIS — I10 Essential (primary) hypertension: Secondary | ICD-10-CM | POA: Diagnosis not present

## 2021-02-18 DIAGNOSIS — Z79899 Other long term (current) drug therapy: Secondary | ICD-10-CM

## 2021-02-18 NOTE — Progress Notes (Signed)
   Subjective:    Patient ID: Hannah Roy, female    DOB: 1940-08-13, 80 y.o.   MRN: [id number]  HPI Pt was at [facility] hospital for 02/05/21-02/09/21 for NSTEMI. Went to [employer] due to blood pressure being  220/95 blood pressure, right arm tingling and indigestion feeling along with heavy feeling on chest.  Pt states she very weak. No chest pain. Hospital wants pt begin 10 mg Crestor daily but pt is unsure of going from 5 mg to 10 mg. Pt also prescribed nitroglycerin. Pt had heart cath 02/07/21.  Review of Systems     Objective:   Physical Exam  Heart is regular lungs clear with pulse normal extremities no edema      Assessment & Plan:  Blood pressure adequate currently Had hypertensive urgency issue Therefore check plasma metanephrines Also check kidney functions Continue current measures If severely elevated blood pressure may need emergent visit to ER or urgent visit to the office Lab work ordered await results Recheck in 4 to 6 weeks

## 2021-02-26 ENCOUNTER — Encounter: Payer: Self-pay | Admitting: Family Medicine

## 2021-02-26 LAB — METANEPHRINES, PLASMA
Metanephrine, Free: 33.4 pg/mL (ref 0.0–88.0)
Normetanephrine, Free: 71.5 pg/mL (ref 0.0–285.2)

## 2021-02-26 LAB — BASIC METABOLIC PANEL
BUN/Creatinine Ratio: 15 (ref 12–28)
BUN: 11 mg/dL (ref 8–27)
CO2: 24 mmol/L (ref 20–29)
Calcium: 9.3 mg/dL (ref 8.7–10.3)
Chloride: 104 mmol/L (ref 96–106)
Creatinine, Ser: 0.74 mg/dL (ref 0.57–1.00)
Glucose: 85 mg/dL (ref 70–99)
Potassium: 4.2 mmol/L (ref 3.5–5.2)
Sodium: 142 mmol/L (ref 134–144)
eGFR: 83 mL/min/{1.73_m2} (ref >59)

## 2021-03-01 ENCOUNTER — Other Ambulatory Visit: Payer: Self-pay

## 2021-03-01 ENCOUNTER — Encounter: Payer: Self-pay | Admitting: Student

## 2021-03-01 ENCOUNTER — Ambulatory Visit (INDEPENDENT_AMBULATORY_CARE_PROVIDER_SITE_OTHER): Payer: Medicare Other | Admitting: Student

## 2021-03-01 VITALS — BP 132/82 | HR 74 | Ht 61.0 in | Wt 123.0 lb

## 2021-03-01 DIAGNOSIS — I38 Endocarditis, valve unspecified: Secondary | ICD-10-CM

## 2021-03-01 DIAGNOSIS — I2510 Atherosclerotic heart disease of native coronary artery without angina pectoris: Secondary | ICD-10-CM | POA: Diagnosis not present

## 2021-03-01 DIAGNOSIS — I480 Paroxysmal atrial fibrillation: Secondary | ICD-10-CM | POA: Diagnosis not present

## 2021-03-01 DIAGNOSIS — E785 Hyperlipidemia, unspecified: Secondary | ICD-10-CM | POA: Diagnosis not present

## 2021-03-01 DIAGNOSIS — I10 Essential (primary) hypertension: Secondary | ICD-10-CM
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
## 2021-03-01 NOTE — Patient Instructions (Signed)
Medication Instructions:  Your physician recommends that you continue on your current medications as directed. Please refer to the Current Medication list given to you today.   Labwork: Fasting lipids and LFT's in December   Testing/Procedures: None today   Follow-Up:  Keep 05/16/21 apt with Dr.McDowell  Any Other Special Instructions Will Be Listed Below (If Applicable).  If you need a refill on your cardiac medications before your next appointment, please call your pharmacy.

## 2021-03-05 ENCOUNTER — Other Ambulatory Visit: Payer: Self-pay | Admitting: Family Medicine

## 2021-03-11 ENCOUNTER — Other Ambulatory Visit: Payer: Self-pay

## 2021-03-11 ENCOUNTER — Ambulatory Visit (INDEPENDENT_AMBULATORY_CARE_PROVIDER_SITE_OTHER): Payer: Medicare Other

## 2021-03-11 DIAGNOSIS — Z23 Encounter for immunization: Secondary | ICD-10-CM

## 2021-03-11 NOTE — Progress Notes (Signed)
   Covid-19 Vaccination Clinic  Name:  NIKIE CID    MRN: [id number] DOB: [birth year]  03/11/2021  Ms. Krzyzanowski was observed post Covid-19 immunization for 15 minutes without incident. She was provided with Vaccine Information Sheet and instruction to access the V-Safe system.   Ms. Westfall was instructed to call 911 with any severe reactions post vaccine: Difficulty breathing  Swelling of face and throat  A fast heartbeat  A bad rash all over body  Dizziness and weakness   Immunizations Administered     Name Date Dose VIS Date Route   Moderna Covid-19 vaccine Bivalent Booster 03/11/2021  9:02 AM 0.5 mL 11/20/2020 Intramuscular   Manufacturer: Moderna   Lot: [id number]   [place]: 37628-315-17

## 2021-03-16 IMAGING — CT CT HEAD W/O CM
3 series · 16 of 47 positions shown, 19 images · non-contrast
Comparison: 07/01/2019

CLINICAL DATA: Headaches

EXAM:
CT HEAD WITHOUT CONTRAST
TECHNIQUE: Contiguous axial images were obtained from the base of the skull
through the vertex without intravenous contrast.

[Series 2: head w o · axial · 0.41mm/px · z∈[+1568,+1693]mm · 10 of 31 slices shown, 13 images]
[im 3/31  brain]
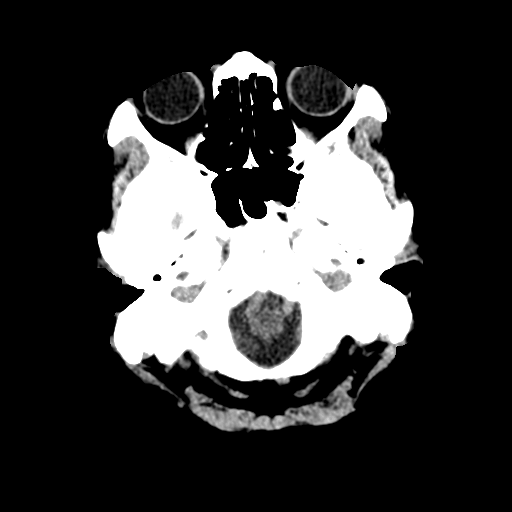
[im 3/31  bone]
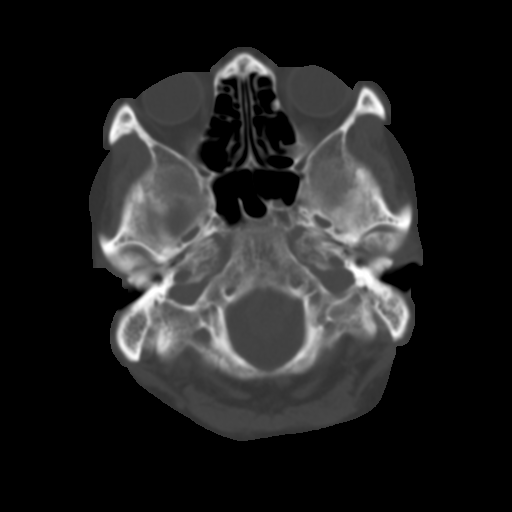
[im 6/31  brain]
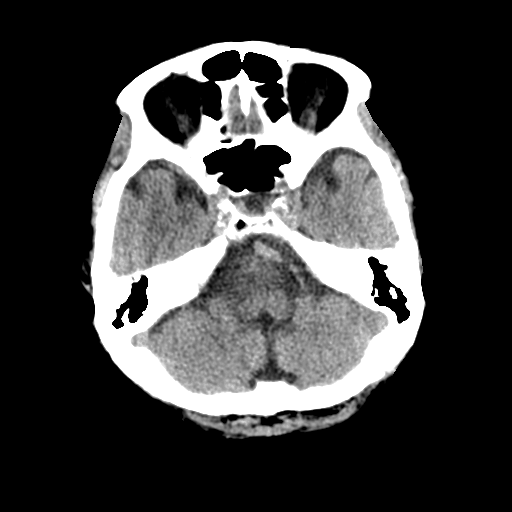
[im 9/31  brain]
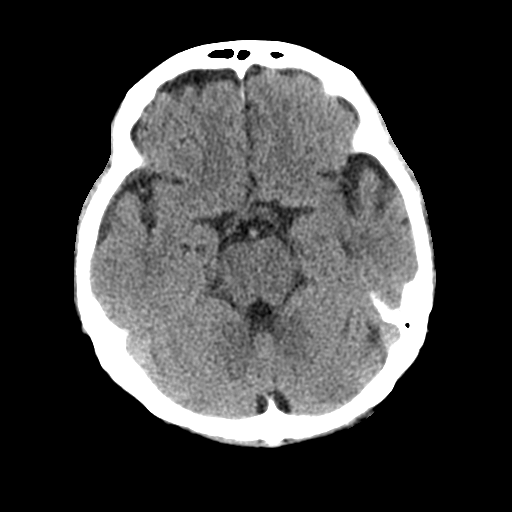
[im 11/31  brain]
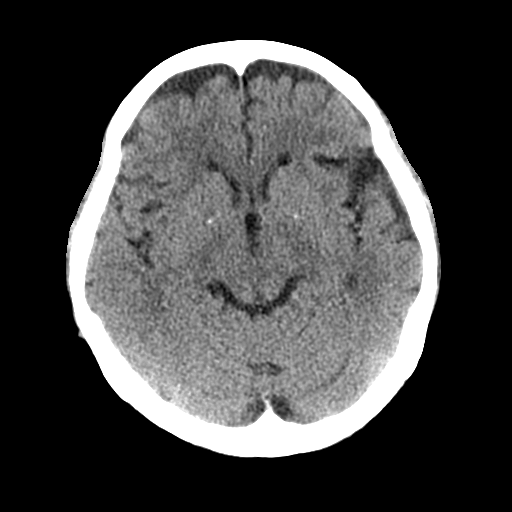
[im 14/31  brain]
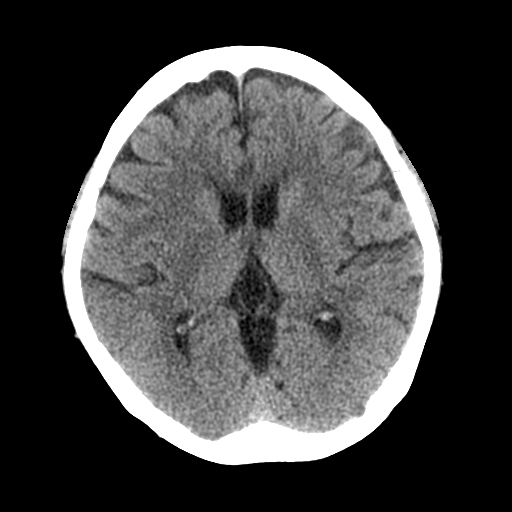
[im 14/31  bone]
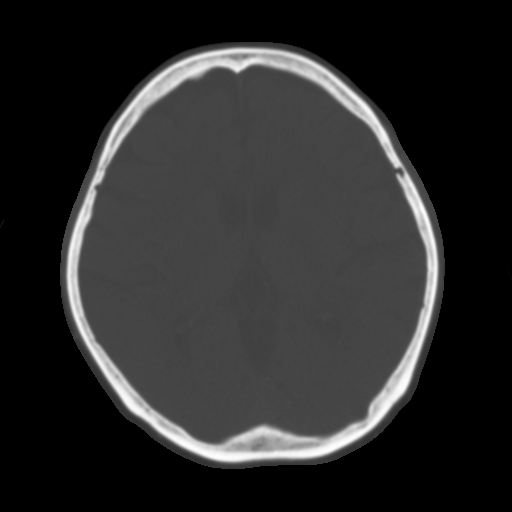
[im 17/31  brain]
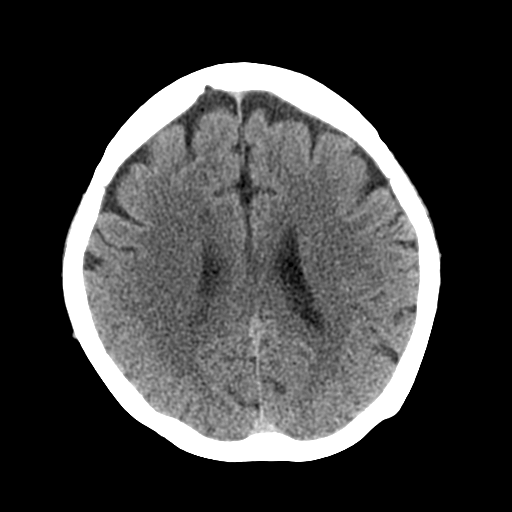
[im 20/31  brain]
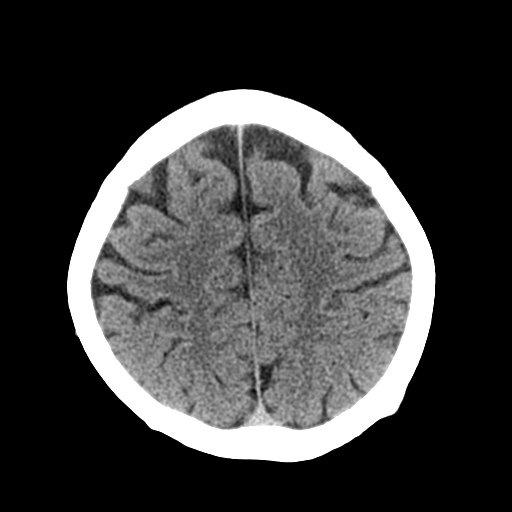
[im 23/31  brain]
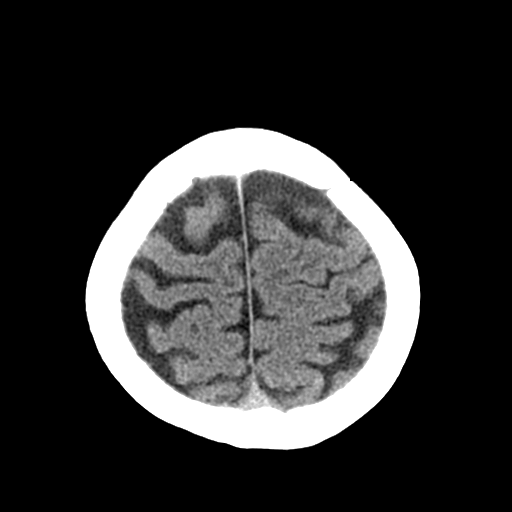
[im 25/31  brain]
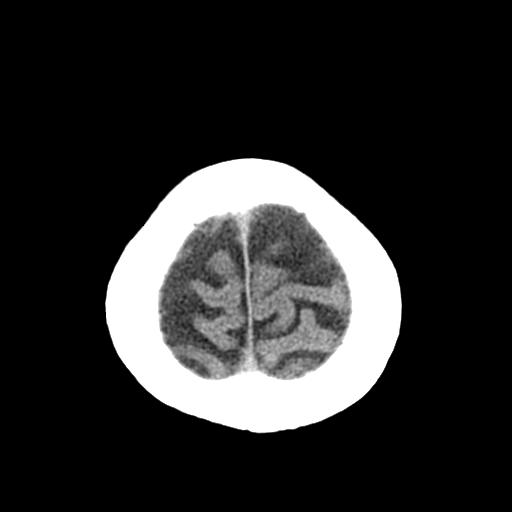
[im 25/31  bone]
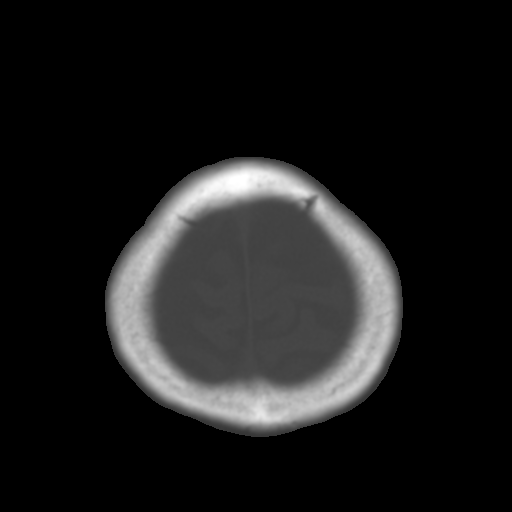
[im 28/31  brain]
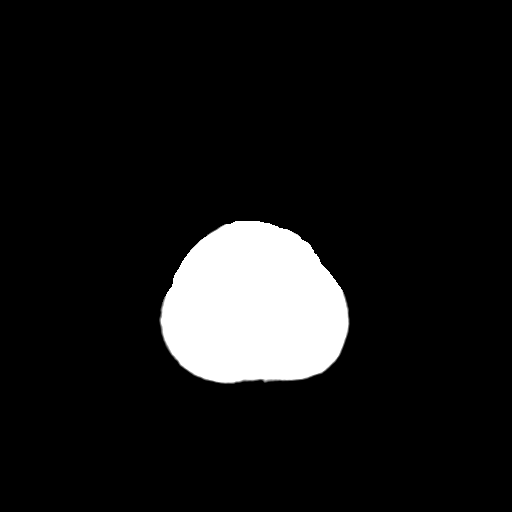

[Series 4: coronal soft · coronal · 0.30mm/px · 3 of 61 slices shown]
[im 21/61  brain]
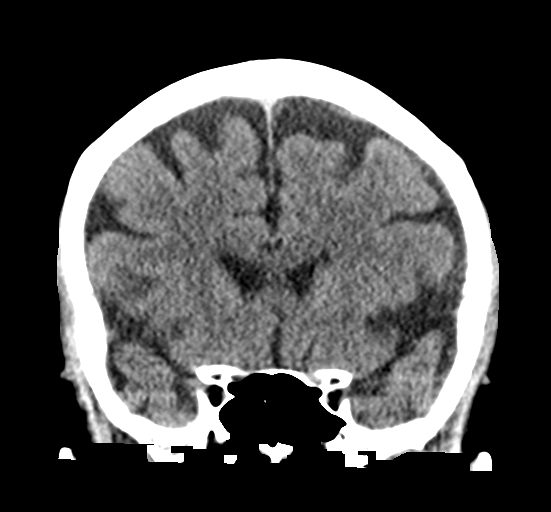
[im 27/61  brain]
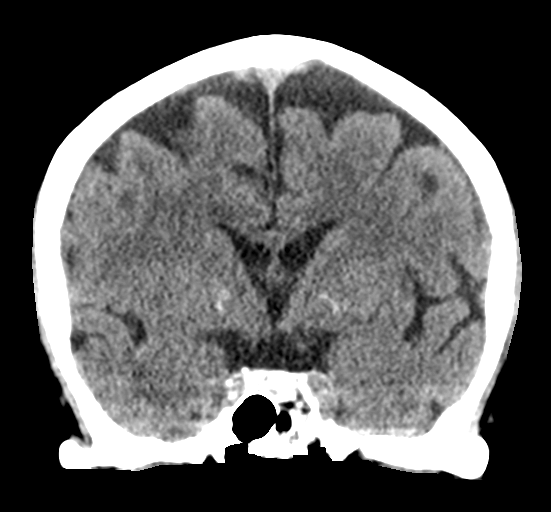
[im 34/61  brain]
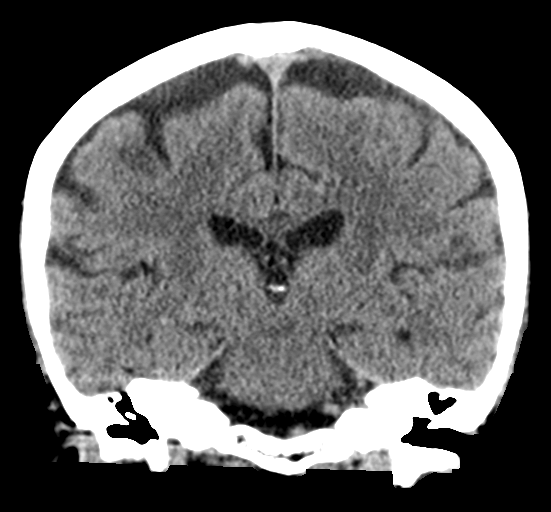

[Series 5: sagittal soft · sagittal · 0.30mm/px · 3 of 56 slices shown]
[im 19/56  brain]
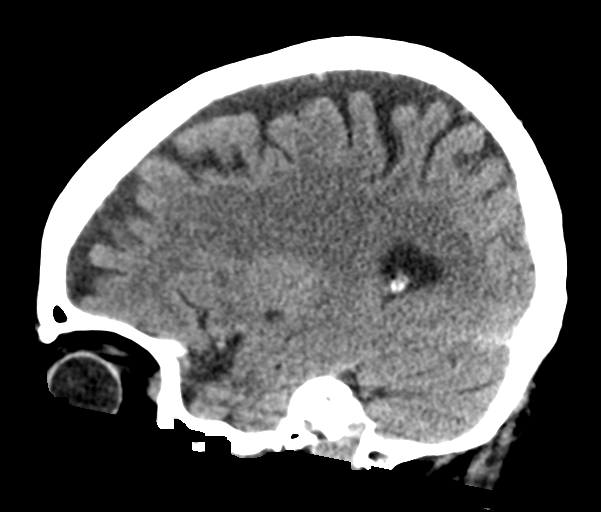
[im 28/56  brain]
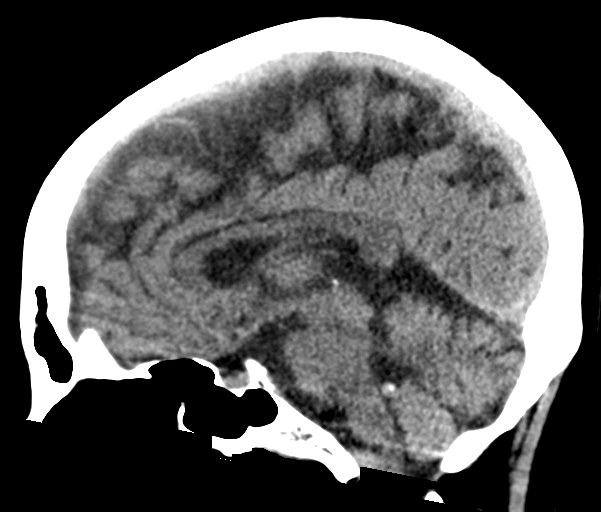
[im 37/56  brain]
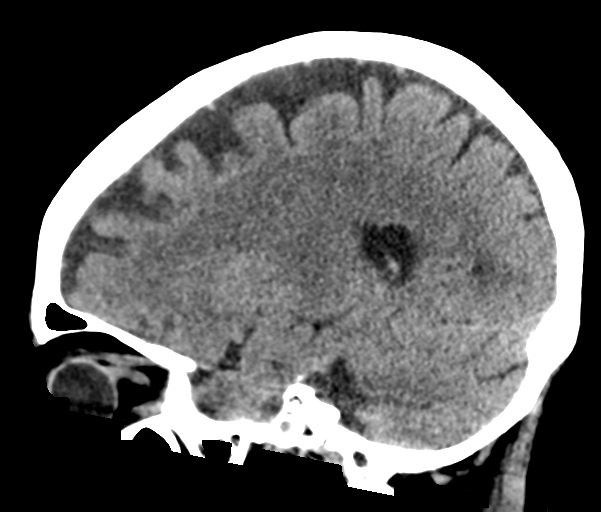

[16 of 47 positions shown; findings below may reference images not displayed]

FINDINGS: Brain: No evidence of acute infarction, hemorrhage, hydrocephalus,
extra-axial collection or mass lesion/mass effect.

Vascular: No hyperdense vessel or unexpected calcification.

Skull: Normal. Negative for fracture or focal lesion.

Sinuses/Orbits: No acute finding.

Other: None.
IMPRESSION: No acute intracranial abnormality noted.

## 2021-03-25 ENCOUNTER — Ambulatory Visit (INDEPENDENT_AMBULATORY_CARE_PROVIDER_SITE_OTHER): Payer: Medicare Other | Admitting: Family Medicine

## 2021-03-25 ENCOUNTER — Other Ambulatory Visit: Payer: Self-pay

## 2021-03-25 VITALS — BP 117/74 | HR 71 | Temp 97.3°F | Ht 61.0 in | Wt 122.0 lb

## 2021-03-25 DIAGNOSIS — I2510 Atherosclerotic heart disease of native coronary artery without angina pectoris: Secondary | ICD-10-CM | POA: Diagnosis not present

## 2021-03-25 DIAGNOSIS — I10 Essential (primary) hypertension: Secondary | ICD-10-CM | POA: Diagnosis not present

## 2021-03-25 NOTE — Patient Instructions (Addendum)
Mid Jan check your cholesterol  Please  fit in walking Keep medication as is Recheck in March

## 2021-03-25 NOTE — Progress Notes (Signed)
° °  Subjective:    Patient ID: Hannah Roy, female    DOB: Aug 23, 1940, 80 y.o.   MRN: [id number]  HPI Follow up on hypertension Patient presents today for follow-up Under the care of cardiology as well States overall she is feeling fairly good Tries to stay relatively active Tries eat healthy   Review of Systems     Objective:   Physical Exam  General-in no acute distress Eyes-no discharge Lungs-respiratory rate normal, CTA CV-no murmurs,RRR Extremities skin warm dry no edema Neuro grossly normal Behavior normal, alert       Assessment & Plan:  BP good Underlying heart condition stable Continue meds Watch diet,stay active Recheck in 4 months

## 2021-03-28 ENCOUNTER — Other Ambulatory Visit (HOSPITAL_COMMUNITY): Payer: Self-pay | Admitting: Nurse Practitioner

## 2021-04-13 ENCOUNTER — Ambulatory Visit (HOSPITAL_COMMUNITY): Payer: Medicare Other | Admitting: Nurse Practitioner

## 2021-04-26 ENCOUNTER — Other Ambulatory Visit: Payer: Self-pay | Admitting: Cardiology

## 2021-05-16 ENCOUNTER — Other Ambulatory Visit (HOSPITAL_COMMUNITY)
Admission: RE | Admit: 2021-05-16 | Discharge: 2021-05-16 | Disposition: A | Discharge status: Home / Self Care | Payer: Medicare Other | Source: Ambulatory Visit | Attending: Cardiology | Admitting: Cardiology

## 2021-05-16 ENCOUNTER — Ambulatory Visit: Payer: Medicare Other | Admitting: Cardiology

## 2021-05-16 ENCOUNTER — Encounter: Payer: Self-pay | Admitting: Cardiology

## 2021-05-16 ENCOUNTER — Other Ambulatory Visit: Payer: Self-pay

## 2021-05-16 VITALS — BP 142/74 | HR 72 | Ht 61.0 in | Wt 121.0 lb

## 2021-05-16 DIAGNOSIS — I480 Paroxysmal atrial fibrillation: Secondary | ICD-10-CM | POA: Insufficient documentation

## 2021-05-16 DIAGNOSIS — I10 Essential (primary) hypertension: Secondary | ICD-10-CM | POA: Diagnosis not present

## 2021-05-16 DIAGNOSIS — I2510 Atherosclerotic heart disease of native coronary artery without angina pectoris: Secondary | ICD-10-CM | POA: Diagnosis not present

## 2021-05-16 DIAGNOSIS — I38 Endocarditis, valve unspecified: Secondary | ICD-10-CM

## 2021-05-16 DIAGNOSIS — E785 Hyperlipidemia, unspecified: Secondary | ICD-10-CM | POA: Diagnosis present

## 2021-05-16 LAB — COMPREHENSIVE METABOLIC PANEL
ALT: 16 U/L (ref 0–44)
AST: 17 U/L (ref 15–41)
Albumin: 4.5 g/dL (ref 3.5–5.0)
Alkaline Phosphatase: 55 U/L (ref 38–126)
Anion gap: 8 (ref 5–15)
BUN: 14 mg/dL (ref 8–23)
CO2: 26 mmol/L (ref 22–32)
Calcium: 9.2 mg/dL (ref 8.9–10.3)
Chloride: 104 mmol/L (ref 98–111)
Creatinine, Ser: 0.78 mg/dL (ref 0.44–1.00)
GFR, Estimated: >60 mL/min (ref >60)
Glucose, Bld: 94 mg/dL (ref 70–99)
Potassium: 3.9 mmol/L (ref 3.5–5.1)
Sodium: 138 mmol/L (ref 135–145)
Total Bilirubin: 1.1 mg/dL (ref 0.3–1.2)
Total Protein: 7.5 g/dL (ref 6.5–8.1)

## 2021-05-16 LAB — MAGNESIUM: Magnesium: 2.1 mg/dL (ref 1.7–2.4)

## 2021-05-16 LAB — LIPID PANEL
Cholesterol: 144 mg/dL (ref 0–200)
HDL: 52 mg/dL (ref >40)
LDL Cholesterol: 77 mg/dL (ref 0–99)
Total CHOL/HDL Ratio: 2.8 RATIO
Triglycerides: 76 mg/dL (ref <150)
VLDL: 15 mg/dL (ref 0–40)
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
## 2021-05-16 NOTE — Patient Instructions (Addendum)
Medication Instructions:  Your physician recommends that you continue on your current medications as directed. Please refer to the Current Medication list given to you today.   Labwork: CMET,Magnesium, Lipids today  Testing/Procedures: None today  Follow-Up: 6 months  Any Other Special Instructions Will Be Listed Below (If Applicable).  If you need a refill on your cardiac medications before your next appointment, please call your pharmacy.   Eliquis 5 mg samples #28, lot [id number], exp 02/2023

## 2021-05-28 ENCOUNTER — Other Ambulatory Visit: Payer: Self-pay | Admitting: Cardiology

## 2021-06-07 ENCOUNTER — Other Ambulatory Visit: Payer: Self-pay

## 2021-06-07 ENCOUNTER — Ambulatory Visit (HOSPITAL_COMMUNITY)
Admission: RE | Admit: 2021-06-07 | Discharge: 2021-06-07 | Disposition: A | Discharge status: Home / Self Care | Payer: Medicare Other | Source: Ambulatory Visit | Attending: Nurse Practitioner | Admitting: Nurse Practitioner

## 2021-06-07 VITALS — BP 148/80 | HR 61 | Ht 61.0 in | Wt 122.0 lb

## 2021-06-07 DIAGNOSIS — I10 Essential (primary) hypertension: Secondary | ICD-10-CM | POA: Diagnosis not present

## 2021-06-07 DIAGNOSIS — I4892 Unspecified atrial flutter: Secondary | ICD-10-CM | POA: Diagnosis not present

## 2021-06-07 DIAGNOSIS — Z953 Presence of xenogenic heart valve: Secondary | ICD-10-CM | POA: Diagnosis not present

## 2021-06-07 DIAGNOSIS — I480 Paroxysmal atrial fibrillation: Secondary | ICD-10-CM

## 2021-06-07 DIAGNOSIS — I099 Rheumatic heart disease, unspecified: Secondary | ICD-10-CM | POA: Diagnosis not present

## 2021-06-07 DIAGNOSIS — E039 Hypothyroidism, unspecified: Secondary | ICD-10-CM | POA: Insufficient documentation

## 2021-06-07 DIAGNOSIS — Z79899 Other long term (current) drug therapy: Secondary | ICD-10-CM | POA: Diagnosis not present

## 2021-06-07 DIAGNOSIS — I2510 Atherosclerotic heart disease of native coronary artery without angina pectoris: Secondary | ICD-10-CM | POA: Diagnosis not present

## 2021-06-07 DIAGNOSIS — D6869 Other thrombophilia: Secondary | ICD-10-CM | POA: Diagnosis not present

## 2021-06-07 NOTE — Patient Instructions (Signed)
For breakthrough afib - if your heart rates are over 100 - can take an extra 1/2 of metoprolol. Make sure your blood pressure (the top number is over 100)
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
## 2021-06-24 ENCOUNTER — Ambulatory Visit: Payer: Medicare Other | Admitting: Family Medicine

## 2021-06-24 ENCOUNTER — Other Ambulatory Visit (HOSPITAL_COMMUNITY): Payer: Self-pay

## 2021-06-24 MED ORDER — APIXABAN 5 MG PO TABS: 5.0000 mg | ORAL_TABLET | Freq: Two times a day (BID) | ORAL | 0 refills | Status: DC

## 2021-06-28 ENCOUNTER — Other Ambulatory Visit: Payer: Self-pay

## 2021-06-28 ENCOUNTER — Encounter: Payer: Self-pay | Admitting: Family Medicine

## 2021-06-28 ENCOUNTER — Ambulatory Visit (INDEPENDENT_AMBULATORY_CARE_PROVIDER_SITE_OTHER): Payer: Medicare Other | Admitting: Family Medicine

## 2021-06-28 VITALS — BP 122/78

## 2021-06-28 DIAGNOSIS — I480 Paroxysmal atrial fibrillation: Secondary | ICD-10-CM | POA: Diagnosis not present

## 2021-06-28 DIAGNOSIS — I77810 Thoracic aortic ectasia: Secondary | ICD-10-CM

## 2021-06-28 DIAGNOSIS — I10 Essential (primary) hypertension: Secondary | ICD-10-CM | POA: Diagnosis not present

## 2021-06-28 NOTE — Progress Notes (Signed)
? ?  Subjective:  ? ? Patient ID: Hannah Roy, female    DOB: [birth year], 81 y.o.   MRN: [id number] ? ?Hypertension ?This is a chronic problem. The problem is controlled. Treatments tried: amlodipine.  ?Patient has complex history of atrial fibrillation she is followed by cardiology and atrial fibrillation clinic she has been doing well lately no racing heart rates. ?She does take her cholesterol medicine on a regular basis and tries to eat healthy ?She does take her blood thinner and denies any bleeding issues ?She denies any chest tightness pressure pain or shortness of breath ? ? ?Review of Systems ? ?   ?Objective:  ? Physical Exam ?General-in no acute distress ?Eyes-no discharge ?Lungs-respiratory rate normal, CTA ?CV-no murmurs,RRR ?Extremities skin warm dry no edema ?Neuro grossly normal ?Behavior normal, alert ? ? ? ? ?   ?Assessment & Plan:  ?She wonders if it would be safe for her to travel with friends to [place] and back. ?She also wonders if it would be fine to go to the beach and back. ?She also wonders if it would be okay to fly on a plane ? ?Given that her symptoms are under good control no overt failure or tachycardia I told her it would be fine to do travel to [place] and to the beach.  If it is advisable to get out of the car every 90 minutes to move around.  I would recommend that she try these measures first before tackling flying on a plane.  As for flying on a plane she hopes to fly to [place] at some point time to visit with relatives ? ?She will follow-up in about 5 months ? ?

## 2021-07-20 ENCOUNTER — Other Ambulatory Visit: Payer: Self-pay | Admitting: Student

## 2021-07-22 ENCOUNTER — Other Ambulatory Visit (HOSPITAL_COMMUNITY): Payer: Self-pay

## 2021-07-22 MED ORDER — DOFETILIDE 250 MCG PO CAPS: 250.0000 ug | ORAL_CAPSULE | Freq: Two times a day (BID) | ORAL | 2 refills | Status: DC

## 2021-08-19 ENCOUNTER — Other Ambulatory Visit: Payer: Self-pay | Admitting: Family Medicine

## 2021-09-07 ENCOUNTER — Ambulatory Visit (INDEPENDENT_AMBULATORY_CARE_PROVIDER_SITE_OTHER): Payer: Medicare Other | Admitting: Family Medicine

## 2021-09-07 VITALS — HR 72 | Temp 98.4°F | Wt 119.6 lb

## 2021-09-07 DIAGNOSIS — J019 Acute sinusitis, unspecified: Secondary | ICD-10-CM

## 2021-09-07 DIAGNOSIS — R509 Fever, unspecified: Secondary | ICD-10-CM

## 2021-09-07 MED ORDER — AMOXICILLIN 500 MG PO CAPS: 500.0000 mg | ORAL_CAPSULE | Freq: Three times a day (TID) | ORAL | 0 refills | Status: AC

## 2021-09-07 NOTE — Progress Notes (Signed)
   Subjective:    Patient ID: Hannah Roy, female    DOB: 1940/12/25, 81 y.o.   MRN: [id number]  HPI  Patient started with nasal congestion and sore throat. She gargled with warm salt water and sore throat went away.  She has been having fever that started Friday night, up to 101, took tylenol, fever gone but came back on Saturday. Temp stayed around 99-101. She says fever broke last night because she was so sweaty. Hoarse sounding, nose has been having blood and yellow mucus from it and feeling weak. Febrile illness for several days this is now broke denies any wheezing difficulty breathing Relates a lot of sinus congestion sinus pressure Review of Systems     Objective:   Physical Exam Gen-NAD not toxic TMS-normal bilateral T- normal no redness Chest-CTA respiratory rate normal no crackles CV RRR no murmur Skin-warm dry Neuro-grossly normal  I doubt she has COVID but we will do COVID testing because of her symptoms      Assessment & Plan:  Patient was seen today for upper respiratory illness. It is felt that the patient is dealing with sinusitis.  Antibiotics were prescribed today.  Compliance discussed.  If worsening symptoms or progressive illness notify us.   If emergency call 911 or go to ER.

## 2021-09-08 LAB — SPECIMEN STATUS REPORT

## 2021-09-08 LAB — NOVEL CORONAVIRUS, NAA: SARS-CoV-2, NAA: NOT DETECTED

## 2021-09-16 ENCOUNTER — Other Ambulatory Visit (HOSPITAL_COMMUNITY): Payer: Self-pay | Admitting: Nurse Practitioner

## 2021-09-19 DIAGNOSIS — M532X1 Spinal instabilities, occipito-atlanto-axial region: Secondary | ICD-10-CM | POA: Insufficient documentation

## 2021-09-20 ENCOUNTER — Ambulatory Visit (INDEPENDENT_AMBULATORY_CARE_PROVIDER_SITE_OTHER): Payer: Medicare Other

## 2021-09-20 VITALS — Ht 61.0 in | Wt 119.0 lb

## 2021-09-20 DIAGNOSIS — Z0000 Encounter for general adult medical examination without abnormal findings: Secondary | ICD-10-CM | POA: Diagnosis not present
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
## 2021-09-20 NOTE — Patient Instructions (Signed)
Hannah Roy , Thank you for taking time to come for your Medicare Wellness Visit. I appreciate your ongoing commitment to your health goals. Please review the following plan we discussed and let me know if I can assist you in the future.   Screening recommendations/referrals: Colonoscopy: Done 11/19/2016 Repeat in 5 years  Mammogram: Done 01/17/2021 Repeat annually  Bone Density: Done 02/01/2017 Repeat every 2 years  Recommended yearly ophthalmology/optometry visit for glaucoma screening and checkup Recommended yearly dental visit for hygiene and checkup  Vaccinations: Influenza vaccine: Done 01/09/2021 Repeat annually  Pneumococcal vaccine: Done 01/07/2014 and 11/09/2010. Tdap vaccine: Due Repeat in 10 years  Shingles vaccine: Done 03/11/2021, 06/20/2019 and 05/22/2019.   Covid-19:Done   Advanced directives: Please bring a copy of your health care power of attorney and living will to the office to be added to your chart at your convenience.   Conditions/risks identified: KEEP UP THE GOOD WORK!!  Next appointment: Follow up in one year for your annual wellness visit 2024.   Preventive Care 79 Years and Older, Female Preventive care refers to lifestyle choices and visits with your health care provider that can promote health and wellness. What does preventive care include? A yearly physical exam. This is also called an annual well check. Dental exams once or twice a year. Routine eye exams. Ask your health care provider how often you should have your eyes checked. Personal lifestyle choices, including: Daily care of your teeth and gums. Regular physical activity. Eating a healthy diet. Avoiding tobacco and drug use. Limiting alcohol use. Practicing safe sex. Taking low-dose aspirin every day. Taking vitamin and mineral supplements as recommended by your health care provider. What happens during an annual well check? The services and screenings done by your health care provider  during your annual well check will depend on your age, overall health, lifestyle risk factors, and family history of disease. Counseling  Your health care provider may ask you questions about your: Alcohol use. Tobacco use. Drug use. Emotional well-being. Home and relationship well-being. Sexual activity. Eating habits. History of falls. Memory and ability to understand (cognition). Work and work Statistician. Reproductive health. Screening  You may have the following tests or measurements: Height, weight, and BMI. Blood pressure. Lipid and cholesterol levels. These may be checked every 5 years, or more frequently if you are over 17 years old. Skin check. Lung cancer screening. You may have this screening every year starting at age 79 if you have a 30-pack-year history of smoking and currently smoke or have quit within the past 15 years. Fecal occult blood test (FOBT) of the stool. You may have this test every year starting at age 79. Flexible sigmoidoscopy or colonoscopy. You may have a sigmoidoscopy every 5 years or a colonoscopy every 10 years starting at age 79. Hepatitis C blood test. Hepatitis B blood test. Sexually transmitted disease (STD) testing. Diabetes screening. This is done by checking your blood sugar (glucose) after you have not eaten for a while (fasting). You may have this done every 1-3 years. Bone density scan. This is done to screen for osteoporosis. You may have this done starting at age 79. Mammogram. This may be done every 1-2 years. Talk to your health care provider about how often you should have regular mammograms. Talk with your health care provider about your test results, treatment options, and if necessary, the need for more tests. Vaccines  Your health care provider may recommend certain vaccines, such as: Influenza vaccine. This is recommended  every year. Tetanus, diphtheria, and acellular pertussis (Tdap, Td) vaccine. You may need a Td booster every  10 years. Zoster vaccine. You may need this after age 60. Pneumococcal 13-valent conjugate (PCV13) vaccine. One dose is recommended after age 79. Pneumococcal polysaccharide (PPSV23) vaccine. One dose is recommended after age 79. Talk to your health care provider about which screenings and vaccines you need and how often you need them. This information is not intended to replace advice given to you by your health care provider. Make sure you discuss any questions you have with your health care provider. Document Released: 04/23/2015 Document Revised: 12/15/2015 Document Reviewed: 01/26/2015 Elsevier Interactive Patient Education  2017 [place] Prevention in the Home Falls can cause injuries. They can happen to people of all ages. There are many things you can do to make your home safe and to help prevent falls. What can I do on the outside of my home? Regularly fix the edges of walkways and driveways and fix any cracks. Remove anything that might make you trip as you walk through a door, such as a raised step or threshold. Trim any bushes or trees on the path to your home. Use bright outdoor lighting. Clear any walking paths of anything that might make someone trip, such as rocks or tools. Regularly check to see if handrails are loose or broken. Make sure that both sides of any steps have handrails. Any raised decks and porches should have guardrails on the edges. Have any leaves, snow, or ice cleared regularly. Use sand or salt on walking paths during winter. Clean up any spills in your garage right away. This includes oil or grease spills. What can I do in the bathroom? Use night lights. Install grab bars by the toilet and in the tub and shower. Do not use towel bars as grab bars. Use non-skid mats or decals in the tub or shower. If you need to sit down in the shower, use a plastic, non-slip stool. Keep the floor dry. Clean up any water that spills on the floor as soon as it  happens. Remove soap buildup in the tub or shower regularly. Attach bath mats securely with double-sided non-slip rug tape. Do not have throw rugs and other things on the floor that can make you trip. What can I do in the bedroom? Use night lights. Make sure that you have a light by your bed that is easy to reach. Do not use any sheets or blankets that are too big for your bed. They should not hang down onto the floor. Have a firm chair that has side arms. You can use this for support while you get dressed. Do not have throw rugs and other things on the floor that can make you trip. What can I do in the kitchen? Clean up any spills right away. Avoid walking on wet floors. Keep items that you use a lot in easy-to-reach places. If you need to reach something above you, use a strong step stool that has a grab bar. Keep electrical cords out of the way. Do not use floor polish or wax that makes floors slippery. If you must use wax, use non-skid floor wax. Do not have throw rugs and other things on the floor that can make you trip. What can I do with my stairs? Do not leave any items on the stairs. Make sure that there are handrails on both sides of the stairs and use them. Fix handrails that are broken  or loose. Make sure that handrails are as long as the stairways. Check any carpeting to make sure that it is firmly attached to the stairs. Fix any carpet that is loose or worn. Avoid having throw rugs at the top or bottom of the stairs. If you do have throw rugs, attach them to the floor with carpet tape. Make sure that you have a light switch at the top of the stairs and the bottom of the stairs. If you do not have them, ask someone to add them for you. What else can I do to help prevent falls? Wear shoes that: Do not have high heels. Have rubber bottoms. Are comfortable and fit you well. Are closed at the toe. Do not wear sandals. If you use a stepladder: Make sure that it is fully opened.  Do not climb a closed stepladder. Make sure that both sides of the stepladder are locked into place. Ask someone to hold it for you, if possible. Clearly mark and make sure that you can see: Any grab bars or handrails. First and last steps. Where the edge of each step is. Use tools that help you move around (mobility aids) if they are needed. These include: Canes. Walkers. Scooters. Crutches. Turn on the lights when you go into a dark area. Replace any light bulbs as soon as they burn out. Set up your furniture so you have a clear path. Avoid moving your furniture around. If any of your floors are uneven, fix them. If there are any pets around you, be aware of where they are. Review your medicines with your doctor. Some medicines can make you feel dizzy. This can increase your chance of falling. Ask your doctor what other things that you can do to help prevent falls. This information is not intended to replace advice given to you by your health care provider. Make sure you discuss any questions you have with your health care provider. Document Released: 01/21/2009 Document Revised: 09/02/2015 Document Reviewed: 05/01/2014 Elsevier Interactive Patient Education  2017 [employer].

## 2021-10-15 ENCOUNTER — Other Ambulatory Visit: Payer: Self-pay | Admitting: Cardiology

## 2021-10-26 ENCOUNTER — Telehealth (HOSPITAL_COMMUNITY): Payer: Self-pay

## 2021-10-26 MED ORDER — DOFETILIDE 250 MCG PO CAPS: 250.0000 ug | ORAL_CAPSULE | Freq: Two times a day (BID) | ORAL | 2 refills | Status: DC

## 2021-10-26 NOTE — Telephone Encounter (Signed)
Patient called in regarding her Tikosyn medication. [facility] is much more expensive for her Tikosyn medication. She was okay with me sending in her medication to [facility] in [place], [place] day supply with 2 refills. Sent in her medication. Consulted with patient's daughter and she verbalized understanding

## 2021-10-28 ENCOUNTER — Encounter (INDEPENDENT_AMBULATORY_CARE_PROVIDER_SITE_OTHER): Payer: Self-pay | Admitting: *Deleted
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
## 2021-11-02 ENCOUNTER — Telehealth: Payer: Self-pay | Admitting: Cardiology

## 2021-11-02 ENCOUNTER — Ambulatory Visit: Payer: Medicare Other | Admitting: Cardiology

## 2021-11-02 ENCOUNTER — Encounter: Payer: Self-pay | Admitting: Cardiology

## 2021-11-02 ENCOUNTER — Other Ambulatory Visit (HOSPITAL_COMMUNITY)
Admission: RE | Admit: 2021-11-02 | Discharge: 2021-11-02 | Disposition: A | Discharge status: Home / Self Care | Payer: Medicare Other | Source: Ambulatory Visit | Attending: Cardiology | Admitting: Cardiology

## 2021-11-02 VITALS — BP 114/68 | HR 62 | Ht 61.0 in | Wt 124.0 lb

## 2021-11-02 DIAGNOSIS — I2510 Atherosclerotic heart disease of native coronary artery without angina pectoris: Secondary | ICD-10-CM

## 2021-11-02 DIAGNOSIS — I10 Essential (primary) hypertension: Secondary | ICD-10-CM

## 2021-11-02 DIAGNOSIS — I480 Paroxysmal atrial fibrillation: Secondary | ICD-10-CM

## 2021-11-02 LAB — CBC
HCT: 36.7 % (ref 36.0–46.0)
Hemoglobin: 11.8 g/dL — ABNORMAL LOW (ref 12.0–15.0)
MCH: 31.2 pg (ref 26.0–34.0)
MCHC: 32.2 g/dL (ref 30.0–36.0)
MCV: 97.1 fL (ref 80.0–100.0)
Platelets: 161 10*3/uL (ref 150–400)
RBC: 3.78 MIL/uL — ABNORMAL LOW (ref 3.87–5.11)
RDW: 12.5 % (ref 11.5–15.5)
WBC: 5.5 10*3/uL (ref 4.0–10.5)
nRBC: 0 % (ref 0.0–0.2)

## 2021-11-02 LAB — BASIC METABOLIC PANEL
Anion gap: 7 (ref 5–15)
BUN: 14 mg/dL (ref 8–23)
CO2: 27 mmol/L (ref 22–32)
Calcium: 9.3 mg/dL (ref 8.9–10.3)
Chloride: 106 mmol/L (ref 98–111)
Creatinine, Ser: 0.73 mg/dL (ref 0.44–1.00)
GFR, Estimated: >60 mL/min (ref >60)
Glucose, Bld: 95 mg/dL (ref 70–99)
Potassium: 4.1 mmol/L (ref 3.5–5.1)
Sodium: 140 mmol/L (ref 135–145)

## 2021-11-02 LAB — MAGNESIUM: Magnesium: 2.1 mg/dL (ref 1.7–2.4)

## 2021-11-02 NOTE — Patient Instructions (Signed)
Medication Instructions:  Your physician recommends that you continue on your current medications as directed. Please refer to the Current Medication list given to you today.   Labwork: Cbc,bmet, magnesium today  Testing/Procedures: None today  Follow-Up: 6 months  Any Other Special Instructions Will Be Listed Below (If Applicable).  If you need a refill on your cardiac medications before your next appointment, please call your pharmacy.

## 2021-11-02 NOTE — Telephone Encounter (Signed)
I already spoke with patient regarding lab results.I mailed her a copy.

## 2021-11-02 NOTE — Telephone Encounter (Signed)
Follow Up:      Patient is returning Cathy's call, concerning her lab results.

## 2021-11-15 ENCOUNTER — Ambulatory Visit: Payer: Medicare Other | Admitting: Family Medicine

## 2021-12-07 ENCOUNTER — Ambulatory Visit (HOSPITAL_COMMUNITY)
Admission: RE | Admit: 2021-12-07 | Discharge: 2021-12-07 | Disposition: A | Discharge status: Home / Self Care | Payer: Medicare Other | Source: Ambulatory Visit | Attending: Nurse Practitioner | Admitting: Nurse Practitioner

## 2021-12-07 ENCOUNTER — Encounter (HOSPITAL_COMMUNITY): Payer: Self-pay | Admitting: Nurse Practitioner

## 2021-12-07 VITALS — BP 142/78 | HR 58 | Ht 61.0 in | Wt 122.4 lb

## 2021-12-07 DIAGNOSIS — I4892 Unspecified atrial flutter: Secondary | ICD-10-CM | POA: Diagnosis not present

## 2021-12-07 DIAGNOSIS — I2510 Atherosclerotic heart disease of native coronary artery without angina pectoris: Secondary | ICD-10-CM | POA: Diagnosis not present

## 2021-12-07 DIAGNOSIS — I099 Rheumatic heart disease, unspecified: Secondary | ICD-10-CM | POA: Insufficient documentation

## 2021-12-07 DIAGNOSIS — I10 Essential (primary) hypertension: Secondary | ICD-10-CM | POA: Diagnosis not present

## 2021-12-07 DIAGNOSIS — Z953 Presence of xenogenic heart valve: Secondary | ICD-10-CM | POA: Diagnosis not present

## 2021-12-07 DIAGNOSIS — E039 Hypothyroidism, unspecified: Secondary | ICD-10-CM | POA: Diagnosis not present

## 2021-12-07 DIAGNOSIS — D6869 Other thrombophilia: Secondary | ICD-10-CM | POA: Diagnosis not present

## 2021-12-07 DIAGNOSIS — I4819 Other persistent atrial fibrillation: Secondary | ICD-10-CM

## 2021-12-07 DIAGNOSIS — I480 Paroxysmal atrial fibrillation: Secondary | ICD-10-CM | POA: Diagnosis not present
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
## 2021-12-07 NOTE — Addendum Note (Signed)
Encounter addended by: Sherran Needs, NP on: 12/07/2021 1:35 PM  Actions taken: Clinical Note Signed

## 2021-12-16 ENCOUNTER — Other Ambulatory Visit (HOSPITAL_COMMUNITY): Payer: Self-pay

## 2021-12-16 MED ORDER — APIXABAN 5 MG PO TABS: 5.0000 mg | ORAL_TABLET | Freq: Two times a day (BID) | ORAL | 0 refills | Status: DC

## 2021-12-20 ENCOUNTER — Other Ambulatory Visit (HOSPITAL_COMMUNITY): Payer: Self-pay | Admitting: Family Medicine

## 2021-12-26 ENCOUNTER — Encounter: Payer: Self-pay | Admitting: Family Medicine

## 2021-12-26 ENCOUNTER — Ambulatory Visit (INDEPENDENT_AMBULATORY_CARE_PROVIDER_SITE_OTHER): Payer: Medicare Other | Admitting: Family Medicine

## 2021-12-26 VITALS — BP 131/80 | HR 64 | Temp 97.2°F | Ht 61.0 in | Wt 122.0 lb

## 2021-12-26 DIAGNOSIS — Z1211 Encounter for screening for malignant neoplasm of colon: Secondary | ICD-10-CM

## 2021-12-26 DIAGNOSIS — Z23 Encounter for immunization: Secondary | ICD-10-CM

## 2021-12-26 DIAGNOSIS — E7849 Other hyperlipidemia: Secondary | ICD-10-CM

## 2021-12-26 DIAGNOSIS — M8580 Other specified disorders of bone density and structure, unspecified site: Secondary | ICD-10-CM

## 2021-12-26 DIAGNOSIS — D509 Iron deficiency anemia, unspecified: Secondary | ICD-10-CM

## 2021-12-26 DIAGNOSIS — Z1231 Encounter for screening mammogram for malignant neoplasm of breast: Secondary | ICD-10-CM

## 2021-12-26 MED ORDER — PANTOPRAZOLE SODIUM 40 MG PO TBEC
DELAYED_RELEASE_TABLET | ORAL | 1 refills | Status: DC
Start: 2021-12-26 — End: 2022-07-28

## 2021-12-26 NOTE — Progress Notes (Signed)
   Subjective:    Patient ID: Hannah Roy, female    DOB: Dec 28, 1940, 81 y.o.   MRN: [id number]  Hypertension This is a chronic problem. Treatments tried: amlodipine.   Patient would like to discuss having to undergo colonoscopy and mammogram testing Patient overall has been feeling well lately saw her cardiologist.  Did some blood work but did show a slight drop in her hemoglobin  Also trouble sleeping , keeps waking at  night We did discuss ways to try to help with sleep that is behavioral oriented to avoid medications because increased risk of falls Review of Systems     Objective:   Physical Exam  General-in no acute distress Eyes-no discharge Lungs-respiratory rate normal, CTA CV-no murmurs Extremities skin warm dry no edema Neuro grossly normal Behavior normal, alert       Assessment & Plan:  1. Immunization due Today - Flu Vaccine QUAD High Dose(Fluad)  2. Iron deficiency anemia, unspecified iron deficiency anemia type CBC hemoglobin slightly down compared to where it was no need to make any major changes currently but repeat CBC in 6 weeks - CBC with Differential/Platelet - Lipid panel  3. Other hyperlipidemia Cholesterol profile later this fall healthy diet continue medications - CBC with Differential/Platelet - Lipid panel  4. Encounter for screening mammogram for malignant neoplasm of breast Recommend mammogram - MM DIGITAL SCREENING BILATERAL  5. Screening for colon cancer Recommend colonoscopy 1 more time will send notification to GI - Ambulatory referral to Gastroenterology  3 osteopenia repeat bone density

## 2021-12-26 NOTE — Patient Instructions (Signed)
Hi Hannah Roy  It was very nice to see you today.  Please do your blood work in about 6 weeks time. We will do your flu shot today. I will send notification to the gastroenterologist to help set up your colonoscopy Our staff Will help set up the mammogram. We will see you back in about 6 months.  For allergies I would recommend generic Zyrtec or Claritin 1 daily along with OTC Astelin nasal spray 2 sprays each nostril twice daily.  Both of these are safe with the medicines you are on.  Please take care if we can be of any help let us know  Thanks-Dr. Nicki Reaper

## 2021-12-28 ENCOUNTER — Other Ambulatory Visit: Payer: Self-pay

## 2021-12-28 DIAGNOSIS — M8580 Other specified disorders of bone density and structure, unspecified site: Secondary | ICD-10-CM

## 2021-12-28 NOTE — Progress Notes (Signed)
Patient contacted and scheduled for bone density and mammo.

## 2022-01-06 ENCOUNTER — Telehealth: Payer: Self-pay

## 2022-01-06 ENCOUNTER — Ambulatory Visit (INDEPENDENT_AMBULATORY_CARE_PROVIDER_SITE_OTHER): Payer: Medicare Other | Admitting: Family Medicine

## 2022-01-06 VITALS — BP 148/82 | HR 70 | Temp 97.8°F | Wt 122.0 lb

## 2022-01-06 DIAGNOSIS — I10 Essential (primary) hypertension: Secondary | ICD-10-CM

## 2022-01-06 NOTE — Telephone Encounter (Signed)
See appt today

## 2022-01-06 NOTE — Telephone Encounter (Signed)
Patient calls with BP readings 156/86 and 166/89 she took amlodipine 2.5 last night and would like to know what she should do with these readings. Denies any headache or chest pain , but says her head feels a little heavy. Please advsie

## 2022-01-06 NOTE — Progress Notes (Signed)
   Subjective:    Patient ID: Hannah Roy, female    DOB: [birth year], 81 y.o.   MRN: [id number]  HPI  Pt reports hight BP readings at home  Took amlodipine 2.5 last night and this morning but has been regularly only taking it once daily We did discuss adjusting dose Review of Systems     Objective:   Physical Exam  Blood pressure significantly elevated Lungs clear heart regular     Assessment & Plan:  Blood pressure elevated Take to the amlodipine every single day this would be 5 mg total follow-up early next week for recheck of blood pressure warning signs discussed in detail May have to consider diuretic use or other measures to be added

## 2022-01-06 NOTE — Patient Instructions (Signed)
Amlodipine 2.5 mg Take 2 each evening  Bring your blood pressure cuff with you  Careful with salt  When you follow-up we can gauge how your blood pressure is doing then if we need to make any adjustments we can do so  Also we will readjust your prescription at that time  TakeCare-Dr. Nicki Reaper

## 2022-01-06 NOTE — Telephone Encounter (Signed)
In this situation I did recommend that the patient bump up the dose of the medicine Amlodipine 2.5 mg she is to take 1 every 12 hours I would recommend follow-up office visit with me next week Bring all of her medicines plus her blood pressure cuff to that visit

## 2022-01-10 ENCOUNTER — Ambulatory Visit (INDEPENDENT_AMBULATORY_CARE_PROVIDER_SITE_OTHER): Payer: Medicare Other | Admitting: Family Medicine

## 2022-01-10 VITALS — BP 124/76 | HR 54 | Temp 97.7°F | Ht 61.0 in | Wt 121.0 lb

## 2022-01-10 DIAGNOSIS — I10 Essential (primary) hypertension: Secondary | ICD-10-CM | POA: Diagnosis not present

## 2022-01-10 MED ORDER — AMLODIPINE BESYLATE 5 MG PO TABS: ORAL_TABLET | ORAL | 5 refills | Status: DC

## 2022-01-10 NOTE — Progress Notes (Signed)
   Subjective:    Patient ID: Hannah Roy, female    DOB: [birth year], 81 y.o.   MRN: [id number]  Hypertension This is a chronic problem. Treatments tried: amlodipine 2.5 1 BID.  Since 01/06/22 She has been utilizing the amlodipine Blood pressures are looking better 1 blood pressure relatively low but other ones look very good Denies any chest tightness pressure pain shortness of breath denies headaches. Review of Systems     Objective:   Physical Exam  Heart regular lungs are clear no crackles extremities no edema blood pressure checked with her cuff and with our cuff very reliable looks good      Assessment & Plan:  HTN Doing well on amlodipine 5 mg daily Supportive measures discussed in detail Follow-up if progressive troubles Warning signs what to do if blood pressure does go up dramatically she can take an additional 2.5 if she has chest pain severe headache go to ER  follow-up here immediately for elevated blood pressure Regular follow-up visit set up

## 2022-01-19 ENCOUNTER — Ambulatory Visit (HOSPITAL_COMMUNITY)
Admission: RE | Admit: 2022-01-19 | Discharge: 2022-01-19 | Disposition: A | Discharge status: Home / Self Care | Payer: Medicare Other | Source: Ambulatory Visit | Attending: Family Medicine | Admitting: Family Medicine

## 2022-01-19 DIAGNOSIS — Z780 Asymptomatic menopausal state: Secondary | ICD-10-CM | POA: Insufficient documentation

## 2022-01-19 DIAGNOSIS — Z13820 Encounter for screening for osteoporosis: Secondary | ICD-10-CM | POA: Diagnosis not present

## 2022-01-19 DIAGNOSIS — Z1231 Encounter for screening mammogram for malignant neoplasm of breast: Secondary | ICD-10-CM | POA: Diagnosis not present

## 2022-01-19 DIAGNOSIS — M8589 Other specified disorders of bone density and structure, multiple sites: Secondary | ICD-10-CM | POA: Insufficient documentation

## 2022-01-19 DIAGNOSIS — M8580 Other specified disorders of bone density and structure, unspecified site: Secondary | ICD-10-CM | POA: Insufficient documentation

## 2022-02-22 ENCOUNTER — Ambulatory Visit (INDEPENDENT_AMBULATORY_CARE_PROVIDER_SITE_OTHER): Payer: Medicare Other | Admitting: Family Medicine

## 2022-02-22 VITALS — BP 128/78 | HR 57 | Temp 97.2°F | Ht 61.0 in | Wt 122.0 lb

## 2022-02-22 DIAGNOSIS — I480 Paroxysmal atrial fibrillation: Secondary | ICD-10-CM | POA: Diagnosis not present

## 2022-02-22 DIAGNOSIS — D509 Iron deficiency anemia, unspecified: Secondary | ICD-10-CM | POA: Diagnosis not present

## 2022-02-22 DIAGNOSIS — E7849 Other hyperlipidemia: Secondary | ICD-10-CM

## 2022-02-22 DIAGNOSIS — Z1211 Encounter for screening for malignant neoplasm of colon: Secondary | ICD-10-CM

## 2022-02-22 DIAGNOSIS — I10 Essential (primary) hypertension: Secondary | ICD-10-CM | POA: Diagnosis not present

## 2022-02-22 MED ORDER — AMLODIPINE BESYLATE 5 MG PO TABS: ORAL_TABLET | ORAL | 1 refills | Status: DC

## 2022-02-22 MED ORDER — METOPROLOL TARTRATE 25 MG PO TABS: ORAL_TABLET | ORAL | 1 refills | Status: DC

## 2022-02-22 NOTE — Progress Notes (Signed)
   Subjective:    Patient ID: Hannah Roy, female    DOB: 1940/05/10, 81 y.o.   MRN: [id number]  Hypertension This is a chronic problem. Treatments tried: amlodipine.  Please send 90 day supply of BP medication Colon cancer screening - Plan: Ambulatory referral to Gastroenterology  Essential hypertension, benign - Plan: CBC with Differential, Basic Metabolic Panel, Lipid Panel, Hepatic Function Panel  Paroxysmal A-fib ([place]) - Plan: CBC with Differential, Basic Metabolic Panel, Lipid Panel, Hepatic Function Panel  Other hyperlipidemia - Plan: CBC with Differential, Basic Metabolic Panel, Lipid Panel, Hepatic Function Panel  Iron deficiency anemia, unspecified iron deficiency anemia type - Plan: CBC with Differential, Basic Metabolic Panel, Lipid Panel, Hepatic Function Panel Her heart has been doing well she follows with specialist on a regular basis Denies any gastritis issues Energy level okay No bleeding issues Does not feel her heart running fast    Review of Systems     Objective:   Physical Exam  Rhythm is normal Extremities no edema blood pressure good on recheck      Assessment & Plan:  1. Colon cancer screening Referral for colonoscopy - Ambulatory referral to Gastroenterology  2. Essential hypertension, benign Continue medication blood pressure under good control - CBC with Differential - Basic Metabolic Panel - Lipid Panel - Hepatic Function Panel  3. Paroxysmal A-fib (HCC) Under good control, no sign of any bleeding, heart rate under good control - CBC with Differential - Basic Metabolic Panel - Lipid Panel - Hepatic Function Panel  4. Other hyperlipidemia Takes medication regular basis - CBC with Differential - Basic Metabolic Panel - Lipid Panel - Hepatic Function Panel  5. Iron deficiency anemia, unspecified iron deficiency anemia type Mild anemia issues, recommend checking CBC - CBC with Differential - Basic Metabolic Panel - Lipid  Panel - Hepatic Function Panel  Follow-up in 4 months

## 2022-02-23 ENCOUNTER — Telehealth: Payer: Self-pay | Admitting: *Deleted

## 2022-02-23 ENCOUNTER — Telehealth: Payer: Self-pay

## 2022-02-23 LAB — BASIC METABOLIC PANEL
BUN/Creatinine Ratio: 15 (ref 12–28)
BUN: 12 mg/dL (ref 8–27)
CO2: 24 mmol/L (ref 20–29)
Calcium: 9.4 mg/dL (ref 8.7–10.3)
Chloride: 104 mmol/L (ref 96–106)
Creatinine, Ser: 0.78 mg/dL (ref 0.57–1.00)
Glucose: 92 mg/dL (ref 70–99)
Potassium: 4.2 mmol/L (ref 3.5–5.2)
Sodium: 141 mmol/L (ref 134–144)
eGFR: 77 mL/min/{1.73_m2} (ref >59)

## 2022-02-23 LAB — LIPID PANEL
Chol/HDL Ratio: 2.3 ratio (ref 0.0–4.4)
Cholesterol, Total: 138 mg/dL (ref 100–199)
HDL: 61 mg/dL (ref >39)
LDL Chol Calc (NIH): 63 mg/dL (ref 0–99)
Triglycerides: 67 mg/dL (ref 0–149)
VLDL Cholesterol Cal: 14 mg/dL (ref 5–40)

## 2022-02-23 LAB — CBC WITH DIFFERENTIAL/PLATELET
Basophils Absolute: 0 10*3/uL (ref 0.0–0.2)
Basos: 0 %
EOS (ABSOLUTE): 0.1 10*3/uL (ref 0.0–0.4)
Eos: 2 %
Hematocrit: 37.2 % (ref 34.0–46.6)
Hemoglobin: 12.1 g/dL (ref 11.1–15.9)
Immature Grans (Abs): 0 10*3/uL (ref 0.0–0.1)
Immature Granulocytes: 0 %
Lymphocytes Absolute: 1.5 10*3/uL (ref 0.7–3.1)
Lymphs: 33 %
MCH: 31 pg (ref 26.6–33.0)
MCHC: 32.5 g/dL (ref 31.5–35.7)
MCV: 95 fL (ref 79–97)
Monocytes Absolute: 0.4 10*3/uL (ref 0.1–0.9)
Monocytes: 9 %
Neutrophils Absolute: 2.6 10*3/uL (ref 1.4–7.0)
Neutrophils: 56 %
Platelets: 189 10*3/uL (ref 150–450)
RBC: 3.9 x10E6/uL (ref 3.77–5.28)
RDW: 12.3 % (ref 11.7–15.4)
WBC: 4.7 10*3/uL (ref 3.4–10.8)

## 2022-02-23 LAB — HEPATIC FUNCTION PANEL
ALT: 20 IU/L (ref 0–32)
AST: 14 IU/L (ref 0–40)
Albumin: 5 g/dL — ABNORMAL HIGH (ref 3.8–4.8)
Alkaline Phosphatase: 83 IU/L (ref 44–121)
Bilirubin Total: 0.8 mg/dL (ref 0.0–1.2)
Bilirubin, Direct: 0.2 mg/dL (ref 0.00–0.40)
Total Protein: 7.9 g/dL (ref 6.0–8.5)

## 2022-02-23 MED ORDER — NITROGLYCERIN 0.4 MG SL SUBL: 0.4000 mg | SUBLINGUAL_TABLET | SUBLINGUAL | 2 refills | Status: DC | PRN

## 2022-02-23 NOTE — Telephone Encounter (Signed)
Patient left message on machine stating she was having chest pressure and her blood pressure was high. Called patient and patient stated the "indigestion" and heaviness feeling started when she was folding closes and was unrelieved so she called heart care because it was getting worse. Patient stated she took 3 nitros -one every 5 minutes as advised by heartcare and laid down- patient states the pressure has eases and BP now 130/80- consult with Dr Nicki Reaper- advised patient got to ER for evaluation and treatment. Patient notified and verbalized understanding.

## 2022-02-23 NOTE — Telephone Encounter (Signed)
Hannah Roy called me to say she was doing laundry at 2:30 pm today when she experienced CP in the center of her chest. She describes as an indigestion/pressure sensation, no belching or nausea though. She rated pain as 9/10. She says she has slight SOB and her face feels hot and is flushing. Her BP is 172/72, HR 68.   I advised her to take a SL NTG and wait 5 minutes, reassess pain and take a second if needed, up to 3 total tablets.   Daughter, Lattie Haw, is at the home. I called her back after she states she had taken 3 NTG and her BP is now 132/76. She says her discomfort is 3/10. Daughter states she is going to take a nap and she will watch her Mom.    I will touch base with her tomorrow.   I will FYI Dr.McDowell

## 2022-02-24 ENCOUNTER — Ambulatory Visit: Payer: Medicare Other | Attending: Student | Admitting: Student

## 2022-02-24 ENCOUNTER — Encounter (INDEPENDENT_AMBULATORY_CARE_PROVIDER_SITE_OTHER): Payer: Self-pay | Admitting: *Deleted

## 2022-02-24 ENCOUNTER — Encounter: Payer: Self-pay | Admitting: Student

## 2022-02-24 VITALS — BP 108/78 | HR 67 | Ht 61.0 in | Wt 121.0 lb

## 2022-02-24 DIAGNOSIS — I2510 Atherosclerotic heart disease of native coronary artery without angina pectoris: Secondary | ICD-10-CM

## 2022-02-24 DIAGNOSIS — Z953 Presence of xenogenic heart valve: Secondary | ICD-10-CM

## 2022-02-24 DIAGNOSIS — E785 Hyperlipidemia, unspecified: Secondary | ICD-10-CM

## 2022-02-24 DIAGNOSIS — I480 Paroxysmal atrial fibrillation: Secondary | ICD-10-CM

## 2022-02-24 DIAGNOSIS — R079 Chest pain, unspecified: Secondary | ICD-10-CM | POA: Diagnosis not present

## 2022-02-24 DIAGNOSIS — I10 Essential (primary) hypertension: Secondary | ICD-10-CM

## 2022-02-24 MED ORDER — NITROGLYCERIN 0.4 MG SL SUBL: 0.4000 mg | SUBLINGUAL_TABLET | SUBLINGUAL | 2 refills | Status: AC | PRN
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
## 2022-02-24 NOTE — Telephone Encounter (Signed)
Patient walked into office today.States she called Dr.Luking  to schedule f/u with him and was told to come here. She feels much better today, no c/o indigestion. States BP was in the 856'D systolic last nite. She still feels flushed and weak. We have an opening today with APP and she would like to be seen.

## 2022-02-24 NOTE — Patient Instructions (Signed)
Medication Instructions:  Your physician recommends that you continue on your current medications as directed. Please refer to the Current Medication list given to you today.   Labwork: None today  Testing/Procedures: Your physician has requested that you have an echocardiogram. Echocardiography is a painless test that uses sound waves to create images of your heart. It provides your doctor with information about the size and shape of your heart and how well your heart's chambers and valves are working. This procedure takes approximately one hour. There are no restrictions for this procedure. Please do NOT wear cologne, perfume, aftershave, or lotions (deodorant is allowed). Please arrive 15 minutes prior to your appointment time.   Follow-Up: Keep January appointment with Dr.McDowell  Any Other Special Instructions Will Be Listed Below (If Applicable).  If you need a refill on your cardiac medications before your next appointment, please call your pharmacy.

## 2022-02-25 ENCOUNTER — Encounter: Payer: Self-pay | Admitting: Family Medicine

## 2022-02-25 NOTE — Progress Notes (Signed)
Please mail to the patient 

## 2022-02-26 ENCOUNTER — Encounter: Payer: Self-pay | Admitting: Student

## 2022-02-27 NOTE — Telephone Encounter (Signed)
She was seen by cardiology they will be doing an echo

## 2022-03-01 ENCOUNTER — Telehealth: Payer: Self-pay

## 2022-03-01 MED ORDER — MOLNUPIRAVIR EUA 200MG CAPSULE: 4.0000 | ORAL_CAPSULE | Freq: Two times a day (BID) | ORAL | 0 refills | Status: AC

## 2022-03-01 NOTE — Telephone Encounter (Signed)
Patient calls and states her husband has been diagnosed with Covid and is currently on medication for it, however she is requesting something called in for herself, she will be taking care of him. She currently has a scratchy throat, no other symptoms reported .

## 2022-03-01 NOTE — Telephone Encounter (Signed)
Patient advised per Dr Nicki Reaper: Given her medications she cannot take Paxlovid because it would adversely interact with her medicines I would recommend molnupiravir 200 mg capsules Directions 4 capsules twice daily for 5 days Please let her know that if she starts this medicine should help lessen the severity of COVID and help prevent it from becoming more severe she needs to watch for any significant chest congestion or shortness of breath she would need to be seen Warning signs to watch for significant shortness of breath, passing out high fevers-please try to give her some reassurance that she should gradually see improvement by Saturday/Sunday and be doing better by early next week but if she is getting worse to notify us or be seen urgent care or ER  Patient verbalized understanding. Prescription sent electronically to pharmacy

## 2022-03-01 NOTE — Telephone Encounter (Signed)
Nurses Given her medications she cannot take Paxlovid because it would adversely interact with her medicines I would recommend molnupiravir 200 mg capsules Directions 4 capsules twice daily for 5 days Please let her know that if she starts this medicine should help lessen the severity of COVID and help prevent it from becoming more severe she needs to watch for any significant chest congestion or shortness of breath she would need to be seen Warning signs to watch for significant shortness of breath, passing out high fevers-please try to give her some reassurance that she should gradually see improvement by Saturday/Sunday and be doing better by early next week but if she is getting worse to notify us or be seen urgent care or ER

## 2022-03-08 ENCOUNTER — Ambulatory Visit (HOSPITAL_COMMUNITY)
Admission: RE | Admit: 2022-03-08 | Discharge: 2022-03-08 | Disposition: A | Discharge status: Home / Self Care | Payer: Medicare Other | Source: Ambulatory Visit | Attending: Student | Admitting: Student

## 2022-03-08 DIAGNOSIS — Z953 Presence of xenogenic heart valve: Secondary | ICD-10-CM | POA: Insufficient documentation

## 2022-03-08 DIAGNOSIS — I480 Paroxysmal atrial fibrillation: Secondary | ICD-10-CM | POA: Diagnosis not present

## 2022-03-08 LAB — ECHOCARDIOGRAM COMPLETE
AR max vel: 1.05 cm2
AV Area VTI: 1.06 cm2
AV Area mean vel: 1.1 cm2
AV Mean grad: 12 mmHg
AV Peak grad: 21.7 mmHg
Ao pk vel: 2.33 m/s
Area-P 1/2: 2.72 cm2
MV VTI: 1.31 cm2
S' Lateral: 2.5 cm

## 2022-03-08 NOTE — Progress Notes (Signed)
*  PRELIMINARY RESULTS* Echocardiogram 2D Echocardiogram has been performed.  Hannah Roy 03/08/2022, 11:52 AM

## 2022-04-19 ENCOUNTER — Ambulatory Visit: Payer: Medicare Other | Admitting: Cardiology
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
## 2022-04-20 ENCOUNTER — Encounter: Payer: Self-pay | Admitting: Cardiology

## 2022-04-20 ENCOUNTER — Ambulatory Visit: Payer: Medicare Other | Attending: Cardiology | Admitting: Cardiology

## 2022-04-20 VITALS — BP 122/76 | HR 50 | Ht 61.0 in | Wt 119.8 lb

## 2022-04-20 DIAGNOSIS — I10 Essential (primary) hypertension: Secondary | ICD-10-CM | POA: Diagnosis not present

## 2022-04-20 DIAGNOSIS — Z953 Presence of xenogenic heart valve: Secondary | ICD-10-CM

## 2022-04-20 DIAGNOSIS — I2510 Atherosclerotic heart disease of native coronary artery without angina pectoris: Secondary | ICD-10-CM

## 2022-04-20 NOTE — Patient Instructions (Signed)
Medication Instructions:  Your physician recommends that you continue on your current medications as directed. Please refer to the Current Medication list given to you today.   Labwork: None today  Testing/Procedures: None today  Follow-Up: 6 months  Any Other Special Instructions Will Be Listed Below (If Applicable).  If you need a refill on your cardiac medications before your next appointment, please call your pharmacy.  

## 2022-04-25 ENCOUNTER — Other Ambulatory Visit (HOSPITAL_COMMUNITY): Payer: Self-pay | Admitting: Nurse Practitioner

## 2022-04-25 ENCOUNTER — Other Ambulatory Visit: Payer: Self-pay | Admitting: Cardiology

## 2022-04-25 ENCOUNTER — Telehealth (INDEPENDENT_AMBULATORY_CARE_PROVIDER_SITE_OTHER): Payer: Self-pay | Admitting: *Deleted

## 2022-04-25 ENCOUNTER — Other Ambulatory Visit: Payer: Self-pay | Admitting: Family Medicine

## 2022-04-25 NOTE — Telephone Encounter (Signed)
    04/25/22  Hannah Roy 1940-08-03  What type of surgery is being performed? Colonoscopy  When is surgery scheduled? TBD  What type of clearance is required (medical or pharmacy to hold medication or both? Medication  Are there any medications that need to be held prior to surgery and how long? Eliquis x 48 hours  Name of physician performing surgery?  Dr. Maylon Peppers [facility] at [facility] Phone: [phone] Fax: [phone]  Anethesia type (none, local, MAC, general)? MAC

## 2022-04-25 NOTE — Telephone Encounter (Signed)
Referring MD/PCP: Dr. Wolfgang Phoenix  Procedure: Colonoscopy  Has patient had this procedure before?  2018  If so, when, by whom and where?    Is there a family history of colon cancer?  Not sure  Who?  What age when diagnosed?    Is patient diabetic? If yes, Type 1 or Type 2   no      Does patient have prosthetic heart valve or mechanical valve?  yes  Do you have a pacemaker/defibrillator?  no  Has patient ever had endocarditis/atrial fibrillation? yes  Does patient use oxygen? no  Has patient had joint replacement within last 12 months?  no  Is patient constipated or do they take laxatives? yes  Does patient have a history of alcohol/drug use?  no  Have you had a stroke/heart attack last 6 mths? no  Do you take medicine for weight loss?  no  For female patients,: have you had a hysterectomy no                      are you post menopausal no                      do you still have your menstrual cycle no  Is patient on blood thinner such as Coumadin, Plavix and/or Aspirin? Yes, eliquis '5mg'$   Medications:  Current Outpatient Medications on File Prior to Visit  Medication Sig Dispense Refill   acetaminophen (TYLENOL) 325 MG tablet Take 1-2 tablets (325-650 mg total) by mouth every 4 (four) hours as needed for mild pain.     amLODipine (NORVASC) 5 MG tablet 5 mg one each evening 90 tablet 1   apixaban (ELIQUIS) 5 MG TABS tablet Take 1 tablet (5 mg total) by mouth 2 (two) times daily. 56 tablet 0   Ascorbic Acid (VITAMIN C) 100 MG tablet Take 100 mg by mouth daily.     dofetilide (TIKOSYN) 250 MCG capsule Take 1 capsule (250 mcg total) by mouth 2 (two) times daily. 180 capsule 2   metoprolol tartrate (LOPRESSOR) 25 MG tablet TAKE ONE TABLET (25 MG TOTAL) BY MOUTH TWO TIMES DAILY. 180 tablet 1   nitroGLYCERIN (NITROSTAT) 0.4 MG SL tablet Place 1 tablet (0.4 mg total) under the tongue every 5 (five) minutes x 3 doses as needed for chest pain. 25 tablet 2   pantoprazole (PROTONIX) 40 MG  tablet TAKE 1 TABLET ('40MG'$  TOTAL)BY MOUTH DAILYBEFORE BREAKFAST. 90 tablet 1   potassium chloride (KLOR-CON) 10 MEQ tablet TAKE 2 TABLETS (20 MEQ TOTAL) BY MOUTH DAILY. 180 tablet 3   rosuvastatin (CRESTOR) 10 MG tablet TAKE ONE TABLET ('10MG'$  TOTAL) BY MOUTH DAILY 90 tablet 3   senna-docusate (SENOKOT-S) 8.6-50 MG tablet Take 2 tablets by mouth at bedtime.     valACYclovir (VALTREX) 1000 MG tablet 2 pills bid times one day as needed for fever blisters 8 tablet 3   No current facility-administered medications on file prior to visit.     Allergies:  Allergies  Allergen Reactions   Bee Venom Swelling and Other (See Comments)    Welts, also   Other Other (See Comments)    ANY PAIN MEDS MUST, MUST BE PRECEDED BY AN ANTIEMETIC!!!!    Boniva [Ibandronic Acid] Nausea And Vomiting and Other (See Comments)    Upsets the stomach and flu-like symptoms    Lisinopril Cough   Latex Rash     [place] pharmacy

## 2022-04-25 NOTE — Telephone Encounter (Signed)
   Patient Name: Hannah Roy  DOB: [birth year] MRN: [id number]  Primary Cardiologist: Rozann Lesches, MD  Clinical pharmacists have reviewed the patient's past medical history, labs, and current medications as part of preoperative protocol coverage. The following recommendations have been made:  Patient with diagnosis of afib on Eliquis for anticoagulation.     Procedure: colonoscopy Date of procedure: TBD   CHA2DS2-VASc Score = 5  This indicates a 7.2% annual risk of stroke. The patient's score is based upon: CHF History: 0 HTN History: 1 Diabetes History: 0 Stroke History: 0 Vascular Disease History: 1 Age Score: 2 Gender Score: 1   CrCl 34m/min Platelet count 189K   Per office protocol, patient can hold Eliquis for 2 days prior to procedure as requested.   I will route this recommendation to the requesting party via Epic fax function and remove from pre-op pool.  Please call with questions.  DMable Fill EMarissa Nestle NP 04/25/2022, 4:08 PM

## 2022-04-25 NOTE — Telephone Encounter (Signed)
Room 3, needs clearance to stop Eliquis. Thanks

## 2022-04-25 NOTE — Telephone Encounter (Signed)
Pharmacy please advise on holding Eliquis prior to colonoscopy scheduled for TBD. Thank you.

## 2022-04-25 NOTE — Telephone Encounter (Signed)
Patient with diagnosis of afib on Eliquis for anticoagulation.    Procedure: colonoscopy Date of procedure: TBD  CHA2DS2-VASc Score = 5  This indicates a 7.2% annual risk of stroke. The patient's score is based upon: CHF History: 0 HTN History: 1 Diabetes History: 0 Stroke History: 0 Vascular Disease History: 1 Age Score: 2 Gender Score: 1   CrCl 28m/min Platelet count 189K  Per office protocol, patient can hold Eliquis for 2 days prior to procedure as requested.    **This guidance is not considered finalized until pre-operative APP has relayed final recommendations.**

## 2022-04-28 NOTE — Telephone Encounter (Signed)
Thanks

## 2022-04-28 NOTE — Telephone Encounter (Signed)
Called pt to schedule, no answer. Will call back

## 2022-05-01 NOTE — Telephone Encounter (Signed)
Called pt. Dates offered for Feb did not work. She will wait for March schedule to see what will be available. Will call once we have that schedule

## 2022-05-18 ENCOUNTER — Encounter (HOSPITAL_COMMUNITY): Payer: Self-pay | Admitting: *Deleted

## 2022-05-25 ENCOUNTER — Encounter (INDEPENDENT_AMBULATORY_CARE_PROVIDER_SITE_OTHER): Payer: Self-pay | Admitting: *Deleted

## 2022-05-25 NOTE — Telephone Encounter (Signed)
Spoke with pt. She has been scheduled for 3/7 at 9:45am. She is going to come by office and pick up instructions/sample. Aware to hold eliquis x 2 days prior. Will include pre-op appt in instructions.

## 2022-05-25 NOTE — Telephone Encounter (Signed)
PA approved via [facility]. Auth# [id number], DOS: Jun 15, 2022 - Sep 13, 2022

## 2022-05-26 NOTE — Telephone Encounter (Signed)
Referral completed

## 2022-06-08 ENCOUNTER — Encounter (HOSPITAL_COMMUNITY): Payer: Self-pay | Admitting: Physician Assistant

## 2022-06-08 ENCOUNTER — Ambulatory Visit (HOSPITAL_COMMUNITY)
Admission: RE | Admit: 2022-06-08 | Discharge: 2022-06-08 | Disposition: A | Discharge status: Home / Self Care | Payer: Medicare Other | Source: Ambulatory Visit | Attending: Physician Assistant | Admitting: Physician Assistant

## 2022-06-08 VITALS — BP 128/80 | HR 55 | Ht 61.0 in | Wt 120.6 lb

## 2022-06-08 DIAGNOSIS — Z7901 Long term (current) use of anticoagulants: Secondary | ICD-10-CM | POA: Diagnosis not present

## 2022-06-08 DIAGNOSIS — E039 Hypothyroidism, unspecified: Secondary | ICD-10-CM | POA: Diagnosis not present

## 2022-06-08 DIAGNOSIS — D6869 Other thrombophilia: Secondary | ICD-10-CM | POA: Diagnosis not present

## 2022-06-08 DIAGNOSIS — I480 Paroxysmal atrial fibrillation: Secondary | ICD-10-CM | POA: Diagnosis not present

## 2022-06-08 DIAGNOSIS — Z79899 Other long term (current) drug therapy: Secondary | ICD-10-CM | POA: Diagnosis not present

## 2022-06-08 DIAGNOSIS — I119 Hypertensive heart disease without heart failure: Secondary | ICD-10-CM | POA: Diagnosis not present

## 2022-06-08 DIAGNOSIS — I2510 Atherosclerotic heart disease of native coronary artery without angina pectoris: Secondary | ICD-10-CM | POA: Insufficient documentation

## 2022-06-08 DIAGNOSIS — I4892 Unspecified atrial flutter: Secondary | ICD-10-CM | POA: Diagnosis not present

## 2022-06-08 DIAGNOSIS — Z953 Presence of xenogenic heart valve: Secondary | ICD-10-CM | POA: Insufficient documentation

## 2022-06-08 DIAGNOSIS — Z5181 Encounter for therapeutic drug level monitoring: Secondary | ICD-10-CM | POA: Diagnosis not present

## 2022-06-08 LAB — BASIC METABOLIC PANEL
Anion gap: 11 (ref 5–15)
BUN: 9 mg/dL (ref 8–23)
CO2: 23 mmol/L (ref 22–32)
Calcium: 9 mg/dL (ref 8.9–10.3)
Chloride: 103 mmol/L (ref 98–111)
Creatinine, Ser: 0.75 mg/dL (ref 0.44–1.00)
GFR, Estimated: >60 mL/min (ref >60)
Glucose, Bld: 88 mg/dL (ref 70–99)
Potassium: 4 mmol/L (ref 3.5–5.1)
Sodium: 137 mmol/L (ref 135–145)

## 2022-06-08 LAB — MAGNESIUM: Magnesium: 2.1 mg/dL (ref 1.7–2.4)

## 2022-06-08 MED ORDER — APIXABAN 2.5 MG PO TABS: 2.5000 mg | ORAL_TABLET | Freq: Two times a day (BID) | ORAL | 6 refills | Status: DC
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
## 2022-06-08 NOTE — Patient Instructions (Signed)
On your birthday decrease eliquis to 2.'5mg'$  twice a day

## 2022-06-12 ENCOUNTER — Other Ambulatory Visit (HOSPITAL_COMMUNITY): Payer: Self-pay | Admitting: *Deleted

## 2022-06-12 MED ORDER — DOFETILIDE 250 MCG PO CAPS: 250.0000 ug | ORAL_CAPSULE | Freq: Two times a day (BID) | ORAL | 1 refills | Status: DC

## 2022-06-13 ENCOUNTER — Encounter (HOSPITAL_COMMUNITY): Payer: Self-pay

## 2022-06-13 ENCOUNTER — Other Ambulatory Visit: Payer: Self-pay

## 2022-06-13 ENCOUNTER — Encounter (HOSPITAL_COMMUNITY)
Admission: RE | Admit: 2022-06-13 | Discharge: 2022-06-13 | Disposition: A | Discharge status: Home / Self Care | Payer: Medicare Other | Source: Ambulatory Visit | Attending: Gastroenterology | Admitting: Gastroenterology

## 2022-06-14 ENCOUNTER — Other Ambulatory Visit (HOSPITAL_COMMUNITY): Payer: Self-pay | Admitting: *Deleted

## 2022-06-14 MED ORDER — DOFETILIDE 250 MCG PO CAPS: 250.0000 ug | ORAL_CAPSULE | Freq: Two times a day (BID) | ORAL | 1 refills | Status: DC

## 2022-06-15 ENCOUNTER — Ambulatory Visit (HOSPITAL_COMMUNITY)
Admission: RE | Admit: 2022-06-15 | Discharge: 2022-06-15 | Disposition: A | Discharge status: Home / Self Care | Payer: Medicare Other | Attending: Gastroenterology | Admitting: Gastroenterology

## 2022-06-15 ENCOUNTER — Encounter (INDEPENDENT_AMBULATORY_CARE_PROVIDER_SITE_OTHER): Payer: Self-pay | Admitting: *Deleted

## 2022-06-15 ENCOUNTER — Encounter (HOSPITAL_COMMUNITY)
Admission: RE | Disposition: A | Discharge status: Home / Self Care | Payer: Self-pay | Source: Home / Self Care | Attending: Gastroenterology

## 2022-06-15 ENCOUNTER — Ambulatory Visit (HOSPITAL_BASED_OUTPATIENT_CLINIC_OR_DEPARTMENT_OTHER): Payer: Medicare Other | Admitting: Registered Nurse

## 2022-06-15 ENCOUNTER — Ambulatory Visit (HOSPITAL_COMMUNITY): Payer: Medicare Other | Admitting: Registered Nurse

## 2022-06-15 ENCOUNTER — Encounter (HOSPITAL_COMMUNITY): Payer: Self-pay | Admitting: Gastroenterology

## 2022-06-15 DIAGNOSIS — K649 Unspecified hemorrhoids: Secondary | ICD-10-CM | POA: Diagnosis not present

## 2022-06-15 DIAGNOSIS — Z1211 Encounter for screening for malignant neoplasm of colon: Secondary | ICD-10-CM | POA: Insufficient documentation

## 2022-06-15 DIAGNOSIS — I4819 Other persistent atrial fibrillation: Secondary | ICD-10-CM | POA: Insufficient documentation

## 2022-06-15 DIAGNOSIS — Z86010 Personal history of colonic polyps: Secondary | ICD-10-CM | POA: Insufficient documentation

## 2022-06-15 DIAGNOSIS — Z953 Presence of xenogenic heart valve: Secondary | ICD-10-CM | POA: Insufficient documentation

## 2022-06-15 DIAGNOSIS — I4891 Unspecified atrial fibrillation: Secondary | ICD-10-CM | POA: Diagnosis not present

## 2022-06-15 DIAGNOSIS — D125 Benign neoplasm of sigmoid colon: Secondary | ICD-10-CM | POA: Diagnosis not present

## 2022-06-15 DIAGNOSIS — M8580 Other specified disorders of bone density and structure, unspecified site: Secondary | ICD-10-CM | POA: Diagnosis not present

## 2022-06-15 DIAGNOSIS — E039 Hypothyroidism, unspecified: Secondary | ICD-10-CM | POA: Diagnosis not present

## 2022-06-15 DIAGNOSIS — D124 Benign neoplasm of descending colon: Secondary | ICD-10-CM | POA: Diagnosis not present

## 2022-06-15 DIAGNOSIS — I10 Essential (primary) hypertension: Secondary | ICD-10-CM | POA: Insufficient documentation

## 2022-06-15 DIAGNOSIS — D120 Benign neoplasm of cecum: Secondary | ICD-10-CM | POA: Diagnosis not present

## 2022-06-15 DIAGNOSIS — Z87891 Personal history of nicotine dependence: Secondary | ICD-10-CM | POA: Insufficient documentation

## 2022-06-15 DIAGNOSIS — K648 Other hemorrhoids: Secondary | ICD-10-CM | POA: Insufficient documentation

## 2022-06-15 DIAGNOSIS — K635 Polyp of colon: Secondary | ICD-10-CM | POA: Diagnosis not present

## 2022-06-15 DIAGNOSIS — I2510 Atherosclerotic heart disease of native coronary artery without angina pectoris: Secondary | ICD-10-CM | POA: Diagnosis not present

## 2022-06-15 DIAGNOSIS — I252 Old myocardial infarction: Secondary | ICD-10-CM | POA: Diagnosis not present

## 2022-06-15 DIAGNOSIS — Z800 Family history of malignant neoplasm of digestive organs: Secondary | ICD-10-CM | POA: Insufficient documentation

## 2022-06-15 DIAGNOSIS — D638 Anemia in other chronic diseases classified elsewhere: Secondary | ICD-10-CM

## 2022-06-15 DIAGNOSIS — D123 Benign neoplasm of transverse colon: Secondary | ICD-10-CM | POA: Diagnosis not present

## 2022-06-15 HISTORY — PX: COLONOSCOPY WITH PROPOFOL: SHX5780

## 2022-06-15 HISTORY — PX: POLYPECTOMY: SHX5525

## 2022-06-15 LAB — HM COLONOSCOPY

## 2022-06-15 SURGERY — COLONOSCOPY WITH PROPOFOL
Anesthesia: General

## 2022-06-15 MED ORDER — EPHEDRINE SULFATE-NACL 50-0.9 MG/10ML-% IV SOSY
PREFILLED_SYRINGE | INTRAVENOUS | Status: DC | PRN
  Administered 2022-06-15: 5 mg via INTRAVENOUS

## 2022-06-15 MED ORDER — GLYCOPYRROLATE PF 0.2 MG/ML IJ SOSY
PREFILLED_SYRINGE | INTRAMUSCULAR | Status: DC | PRN
  Administered 2022-06-15: .2 mg via INTRAVENOUS

## 2022-06-15 MED ORDER — LIDOCAINE HCL (CARDIAC) PF 100 MG/5ML IV SOSY
PREFILLED_SYRINGE | INTRAVENOUS | Status: DC | PRN
  Administered 2022-06-15: 40 mg via INTRAVENOUS

## 2022-06-15 MED ORDER — PROPOFOL 10 MG/ML IV BOLUS
INTRAVENOUS | Status: DC | PRN
  Administered 2022-06-15 (×4): 20 mg via INTRAVENOUS
  Administered 2022-06-15: 50 mg via INTRAVENOUS
  Administered 2022-06-15 (×3): 20 mg via INTRAVENOUS

## 2022-06-15 MED ORDER — LACTATED RINGERS IV SOLN: INTRAVENOUS | Status: DC | PRN

## 2022-06-15 NOTE — Anesthesia Preprocedure Evaluation (Signed)
Anesthesia Evaluation  Patient identified by MRN, date of birth, ID band Patient awake    Reviewed: Allergy & Precautions, H&P , NPO status , Patient's Chart, lab work & pertinent test results, reviewed documented beta blocker date and time   Airway Mallampati: II  TM Distance: >3 FB Neck ROM: full    Dental no notable dental hx.    Pulmonary neg pulmonary ROS, former smoker   Pulmonary exam normal breath sounds clear to auscultation       Cardiovascular Exercise Tolerance: Good hypertension, + CAD and + Past MI  + dysrhythmias Atrial Fibrillation  Rhythm:regular Rate:Normal     Neuro/Psych negative neurological ROS  negative psych ROS   GI/Hepatic Neg liver ROS, PUD,,,  Endo/Other  Hypothyroidism    Renal/GU negative Renal ROS  negative genitourinary   Musculoskeletal   Abdominal   Peds  Hematology  (+) Blood dyscrasia, anemia   Anesthesia Other Findings S/P MVR and AVR 2020  Reproductive/Obstetrics negative OB ROS                             Anesthesia Physical Anesthesia Plan  ASA: 3  Anesthesia Plan: General   Post-op Pain Management:    Induction:   PONV Risk Score and Plan: Propofol infusion  Airway Management Planned:   Additional Equipment:   Intra-op Plan:   Post-operative Plan:   Informed Consent: I have reviewed the patients History and Physical, chart, labs and discussed the procedure including the risks, benefits and alternatives for the proposed anesthesia with the patient or authorized representative who has indicated his/her understanding and acceptance.     Dental Advisory Given  Plan Discussed with: CRNA  Anesthesia Plan Comments:        Anesthesia Quick Evaluation

## 2022-06-15 NOTE — Transfer of Care (Signed)
Immediate Anesthesia Transfer of Care Note  Patient: Hannah Roy  Procedure(s) Performed: COLONOSCOPY WITH PROPOFOL POLYPECTOMY  Patient Location: PACU  Anesthesia Type:MAC  Level of Consciousness: awake, alert  and oriented  Airway & Oxygen Therapy: Patient Spontanous Breathing  Post-op Assessment: Report given to RN  Post vital signs: Reviewed and stable  Last Vitals:  Vitals Value Taken Time  BP 103/53 06/15/22 1009  Temp 36.6 C 06/15/22 1009  Pulse 64 06/15/22 1009  Resp 19 06/15/22 1009  SpO2 98 % 06/15/22 1009    Last Pain:  Vitals:   06/15/22 1009  TempSrc: Oral  PainSc: 0-No pain         Complications: No notable events documented.

## 2022-06-15 NOTE — Op Note (Signed)
[facility] Patient Name: Hannah Roy Procedure Date: 06/15/2022 9:25 AM MRN: [id number] Date of Birth: Apr 23, 1940 Attending MD: Maylon Peppers , , [id number] CSN: [id number] Age: 82 Admit Type: Outpatient Procedure:                Colonoscopy Indications:              Surveillance: Personal history of adenomatous                            polyps on last colonoscopy 5 years ago Providers:                Maylon Peppers, Rosina Lowenstein, RN, Ladoris Gene                            Technician, Technician Referring MD:              Medicines:                Monitored Anesthesia Care Complications:            No immediate complications. Estimated Blood Loss:     Estimated blood loss: none. Procedure:                Pre-Anesthesia Assessment:                           - Prior to the procedure, a History and Physical                            was performed, and patient medications, allergies                            and sensitivities were reviewed. The patient's                            tolerance of previous anesthesia was reviewed.                           - The risks and benefits of the procedure and the                            sedation options and risks were discussed with the                            patient. All questions were answered and informed                            consent was obtained.                           - ASA Grade Assessment: III - A patient with severe                            systemic disease.                           After obtaining informed consent, the colonoscope  was passed under direct vision. Throughout the                            procedure, the patient's blood pressure, pulse, and                            oxygen saturations were monitored continuously. The                            [id number] scope was introduced through                            the anus and advanced to the the cecum, identified                             by appendiceal orifice and ileocecal valve. The                            colonoscopy was performed without difficulty. The                            patient tolerated the procedure well. The quality                            of the bowel preparation was excellent. Scope In: 9:44:16 AM Scope Out: 10:04:29 AM Scope Withdrawal Time: 0 hours 12 minutes 21 seconds  Total Procedure Duration: 0 hours 20 minutes 13 seconds  Findings:      The perianal and digital rectal examinations were normal.      A 5 mm polyp was found in the transverse colon. The polyp was sessile.       The polyp was removed with a cold snare. Resection and retrieval were       complete.      Two sessile polyps were found in the sigmoid colon and descending colon.       The polyps were 3 to 4 mm in size. These polyps were removed with a cold       snare. Resection and retrieval were complete.      Non-bleeding internal hemorrhoids were found during retroflexion. The       hemorrhoids were small. Impression:               - One 5 mm polyp in the transverse colon, removed                            with a cold snare. Resected and retrieved.                           - Two 3 to 4 mm polyps in the sigmoid colon and in                            the descending colon, removed with a cold snare.                            Resected and retrieved.                           -  Non-bleeding internal hemorrhoids. Moderate Sedation:      Per Anesthesia Care Recommendation:           - Discharge patient to home (ambulatory).                           - Resume previous diet.                           - Await pathology results.                           - Repeat colonoscopy for surveillance based on                            pathology results. Procedure Code(s):        --- Professional ---                           [id number], Colonoscopy, flexible; with removal of                            tumor(s),  polyp(s), or other lesion(s) by snare                            technique Diagnosis Code(s):        --- Professional ---                           Z86.010, Personal history of colonic polyps                           D12.3, Benign neoplasm of transverse colon (hepatic                            flexure or splenic flexure)                           D12.5, Benign neoplasm of sigmoid colon                           D12.4, Benign neoplasm of descending colon                           K64.8, Other hemorrhoids CPT copyright 2022 American [employer] Association. All rights reserved. The codes documented in this report are preliminary and upon coder review may  be revised to meet current compliance requirements. Maylon Peppers, MD Maylon Peppers,  06/15/2022 10:13:12 AM This report has been signed electronically. Number of Addenda: 0
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
## 2022-06-15 NOTE — Discharge Instructions (Addendum)
You are being discharged to home.  Resume your previous diet.  We are waiting for your pathology results.  Your physician has recommended a repeat colonoscopy for surveillance based on pathology results.  Restart Eliquis tonight

## 2022-06-16 NOTE — Anesthesia Postprocedure Evaluation (Signed)
Anesthesia Post Note  Patient: Hannah Roy  Procedure(s) Performed: COLONOSCOPY WITH PROPOFOL POLYPECTOMY  Patient location during evaluation: Phase II Anesthesia Type: General Level of consciousness: awake Pain management: pain level controlled Vital Signs Assessment: post-procedure vital signs reviewed and stable Respiratory status: spontaneous breathing and respiratory function stable Cardiovascular status: blood pressure returned to baseline and stable Postop Assessment: no headache and no apparent nausea or vomiting Anesthetic complications: no Comments: Late entry   No notable events documented.   Last Vitals:  Vitals:   06/15/22 0829 06/15/22 1009  BP: 135/73 (!) 103/53  Pulse: (!) 58 64  Resp: 13 19  Temp: 36.6 C 36.6 C  SpO2: 96% 98%    Last Pain:  Vitals:   06/15/22 1009  TempSrc: Oral  PainSc: 0-No pain                 Louann Sjogren

## 2022-06-19 LAB — SURGICAL PATHOLOGY

## 2022-06-20 ENCOUNTER — Ambulatory Visit: Payer: Medicare Other | Admitting: Family Medicine

## 2022-06-21 ENCOUNTER — Other Ambulatory Visit (HOSPITAL_COMMUNITY): Payer: Self-pay

## 2022-06-21 NOTE — Telephone Encounter (Signed)
Patient given samples of Eliquis 2.5 mg tablets Lot # [id number], Lot # [id number] Quantity: 28 tablets Exp: 08/08/2022  Patient will start this dose in June once she turns 82 years old.

## 2022-06-23 ENCOUNTER — Other Ambulatory Visit: Payer: Self-pay | Admitting: Cardiology

## 2022-06-26 ENCOUNTER — Encounter (HOSPITAL_COMMUNITY): Payer: Self-pay | Admitting: Gastroenterology

## 2022-06-27 ENCOUNTER — Ambulatory Visit (INDEPENDENT_AMBULATORY_CARE_PROVIDER_SITE_OTHER): Payer: Medicare Other | Admitting: Family Medicine

## 2022-06-27 VITALS — BP 133/72 | Ht 61.0 in | Wt 119.6 lb

## 2022-06-27 DIAGNOSIS — D369 Benign neoplasm, unspecified site: Secondary | ICD-10-CM

## 2022-06-27 DIAGNOSIS — R5383 Other fatigue: Secondary | ICD-10-CM

## 2022-06-27 DIAGNOSIS — R001 Bradycardia, unspecified: Secondary | ICD-10-CM

## 2022-06-27 NOTE — Progress Notes (Signed)
   Subjective:    Patient ID: Hannah Roy, female    DOB: 12-Aug-1940, 82 y.o.   MRN: [id number]  Hypertension This is a chronic problem. The current episode started more than 1 year ago. Risk factors for coronary artery disease include dyslipidemia and post-menopausal state. Treatments tried: lopressor, norvasc. There are no compliance problems.    Patient states she has not felt bad since preparing for and having colonoscopy She had a colonoscopy did show tubular adenoma GI doctor told her to do another colonoscopy in approximately 5 years She denies any major setbacks otherwise   Review of Systems     Objective:   Physical Exam General-in no acute distress Eyes-no discharge Lungs-respiratory rate normal, CTA CV-no murmurs, bradycardia Extremities skin warm dry no edema Neuro grossly normal Behavior normal, alert        Assessment & Plan:  1. Other fatigue She relates fatigue ever since having her colonoscopy.  She is back to eating and drinking bowels are moving okay if this does not resolve over the course of the next week to do lab work but for now reassurance  2. Bradycardia Slight bradycardia but not unchanged compared to previous she is under the care of cardiology we will elect to keep everything as is but if she continues to have fatigue and tiredness perhaps reducing metoprolol to half dose twice daily would be the next step she will give [place] feedback next week how she is doing - EKG 12-Lead  3. Tubular adenoma She had this recently removed they recommend another 1 in 5 years time certainly that is based around her overall health

## 2022-07-21 ENCOUNTER — Other Ambulatory Visit: Payer: Self-pay | Admitting: Family Medicine

## 2022-09-05 ENCOUNTER — Other Ambulatory Visit (HOSPITAL_COMMUNITY): Payer: Self-pay | Admitting: *Deleted

## 2022-09-05 MED ORDER — DOFETILIDE 250 MCG PO CAPS: 250.0000 ug | ORAL_CAPSULE | Freq: Two times a day (BID) | ORAL | 2 refills | Status: DC

## 2022-09-19 ENCOUNTER — Other Ambulatory Visit: Payer: Self-pay | Admitting: Family Medicine

## 2022-10-05 ENCOUNTER — Other Ambulatory Visit (HOSPITAL_COMMUNITY): Payer: Self-pay

## 2022-10-05 MED ORDER — APIXABAN 2.5 MG PO TABS: 2.5000 mg | ORAL_TABLET | Freq: Two times a day (BID) | ORAL | 0 refills | Status: DC

## 2022-10-24 NOTE — Progress Notes (Unsigned)
Cardiology Office Note  Date: 10/25/2022   ID: Hannah Roy, DOB 08/29/1940, MRN [id number]  History of Present Illness: Hannah Roy is an 82 y.o. female last seen in January.  She had interval follow-up in the atrial fibrillation clinic in February, reviewed the note.  She is here for a routine visit.  Overall doing well, although has been stressed due to intermittent illness and health changes with her husband.  She does not report any angina, no palpitations, no syncope.  Stable NYHA class II dyspnea.  I reviewed her medications and we discussed continuing current regimen.  Eliquis is now at 2.5 mg twice daily based on age and weight.  She does not report any spontaneous bleeding problems.  Today's blood pressure is well-controlled.  She does wear a Fitbit to help monitor heart rate.  Follow-up echocardiogram from November 2023 is noted below.  Physical Exam: VS:  BP 112/68   Pulse 62   Wt 117 lb (53.1 kg)   SpO2 97%   BMI 22.11 kg/m , BMI Body mass index is 22.11 kg/m.  Wt Readings from Last 3 Encounters:  10/25/22 117 lb (53.1 kg)  06/27/22 119 lb 9.6 oz (54.3 kg)  06/15/22 120 lb (54.4 kg)    General: Patient appears comfortable at rest. HEENT: Conjunctiva and lids normal. Lungs: Clear to auscultation, nonlabored breathing at rest. Cardiac: Regular rate and rhythm, no S3, 3/6  systolic murmur. Extremities: No pitting edema.  ECG:  An ECG dated 06/27/2022 was personally reviewed today and demonstrated:  Sinus bradycardia with prolonged PR interval and right bundle branch block.  Labwork: 02/22/2022: ALT 20; AST 14; Hemoglobin 12.1; Platelets 189 06/08/2022: BUN 9; Creatinine, Ser 0.75; Magnesium 2.1; Potassium 4.0; Sodium 137     Component Value Date/Time   CHOL 138 02/22/2022 1114   TRIG 67 02/22/2022 1114   HDL 61 02/22/2022 1114   CHOLHDL 2.3 02/22/2022 1114   CHOLHDL 2.8 05/16/2021 1036   VLDL 15 05/16/2021 1036   LDLCALC 63 02/22/2022 1114   Other Studies  Reviewed Today:  Echocardiogram 03/08/2022:  1. Left ventricular ejection fraction, by estimation, is 60 to 65%. The  left ventricle has normal function. The left ventricle has no regional  wall motion abnormalities. LV diastolic function not fully evaluated due  to mitral valve replacement.   2. Right ventricular systolic function is normal. The right ventricular  size is normal. There is normal pulmonary artery systolic pressure.   3. Left atrial size was severely dilated.   4. Right atrial size was severely dilated.   5. The mitral valve has been repaired/replaced. No evidence of mitral  valve regurgitation. No evidence of mitral stenosis. There is a 29 mm  Magna Mitral Ease bioprosthetic valve present in the mitral position.   6. Edwards MC3 tricuspid annuloplasty ring size T28 is present. Tricuspid  valve regurgitation is mild. No tricuspid valve stenosis.   7. The aortic valve has been repaired/replaced. Aortic valve  regurgitation is not visualized. No aortic stenosis is present. There is a  21 mm Magna bioprosthetic valve present in the aortic position.   8. The inferior vena cava is normal in size with greater than 50%  respiratory variability, suggesting right atrial pressure of 3 mmHg.   Assessment and Plan:  1.  History of ascending aortic aneurysm with associated valvular heart disease status post 28 mm Hemashield platinum supracoronary graft, 21 mm Edwards Magna Ease pericardial AVR, Maze procedure with left atrial appendage clipping,  29 mm [facility] Mitral pericardial MVR, and 28 mm Edwards MC3 tricuspid annuloplasty in May 2020.  Follow-up echocardiogram in November 2023 is outlined above.  She remains symptomatically stable, not on aspirin given concurrent use of Eliquis.  2.  Paroxysmal atrial fibrillation with CHA2DS2-VASc score of 4.  She remains on Eliquis 2.5 mg twice daily along with Tikosyn and Lopressor.  I reviewed her ECG from March and interval lab work.  She  does not report any spontaneous bleeding problems.  3.  Mild CAD by cardiac catheterization in October 2022.  No active angina.  Continue Crestor.  Her last LDL was 63.  4.  Essential hypertension.  No changes made to current regimen.  Disposition:  Follow up  6 months.  Signed, Jonelle Sidle, M.D., F.A.C.C. [facility]

## 2022-10-25 ENCOUNTER — Encounter: Payer: Self-pay | Admitting: Cardiology

## 2022-10-25 ENCOUNTER — Ambulatory Visit: Payer: Medicare Other | Attending: Cardiology | Admitting: Cardiology

## 2022-10-25 VITALS — BP 112/68 | HR 62 | Wt 117.0 lb

## 2022-10-25 DIAGNOSIS — I10 Essential (primary) hypertension: Secondary | ICD-10-CM

## 2022-10-25 DIAGNOSIS — I2510 Atherosclerotic heart disease of native coronary artery without angina pectoris: Secondary | ICD-10-CM

## 2022-10-25 DIAGNOSIS — Z953 Presence of xenogenic heart valve: Secondary | ICD-10-CM | POA: Diagnosis not present

## 2022-10-25 DIAGNOSIS — I480 Paroxysmal atrial fibrillation: Secondary | ICD-10-CM

## 2022-10-25 MED ORDER — APIXABAN 2.5 MG PO TABS: 2.5000 mg | ORAL_TABLET | Freq: Two times a day (BID) | ORAL | 0 refills | Status: DC

## 2022-10-25 NOTE — Addendum Note (Signed)
Addended by: Marlyn Corporal A on: 10/25/2022 11:42 AM   Modules accepted: Orders

## 2022-10-25 NOTE — Patient Instructions (Signed)
 Medication Instructions:  Your physician recommends that you continue on your current medications as directed. Please refer to the Current Medication list given to you today.   Labwork: None today  Testing/Procedures: None today  Follow-Up: 6 months Dr.McDowell  Any Other Special Instructions Will Be Listed Below (If Applicable).  If you need a refill on your cardiac medications before your next appointment, please call your pharmacy.  

## 2022-11-03 ENCOUNTER — Ambulatory Visit (INDEPENDENT_AMBULATORY_CARE_PROVIDER_SITE_OTHER): Payer: Medicare Other

## 2022-11-03 VITALS — BP 119/77 | Ht 61.0 in | Wt 117.0 lb

## 2022-11-03 DIAGNOSIS — Z0000 Encounter for general adult medical examination without abnormal findings: Secondary | ICD-10-CM | POA: Diagnosis not present
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
## 2022-11-03 NOTE — Patient Instructions (Addendum)
Hannah Roy , Thank you for taking time to come for your Medicare Wellness Visit. I appreciate your ongoing commitment to your health goals. Please review the following plan we discussed and let me know if I can assist you in the future.   Referrals/Orders/Follow-Ups/Clinician Recommendations:   This is a list of the screening recommended for you and due dates:  Health Maintenance  Topic Date Due   DTaP/Tdap/Td vaccine (1 - Tdap) Never done   Zoster (Shingles) Vaccine (2 of 2) 05/15/2019   COVID-19 Vaccine (4 - 2023-24 season) 12/09/2021   Flu Shot  11/09/2022   Medicare Annual Wellness Visit  11/03/2023   Colon Cancer Screening  06/15/2027   Pneumonia Vaccine  Completed   DEXA scan (bone density measurement)  Completed   HPV Vaccine  Aged Out   Hepatitis C Screening  Discontinued    Advanced directives: (Declined) Advance directive discussed with you today. Even though you declined this today, please call our office should you change your mind, and we can give you the proper paperwork for you to fill out.  Next Medicare Annual Wellness Visit scheduled for next year: Yes  Preventive Care 80 Years and Older, Female Preventive care refers to lifestyle choices and visits with your health care provider that can promote health and wellness. What does preventive care include? A yearly physical exam. This is also called an annual well check. Dental exams once or twice a year. Routine eye exams. Ask your health care provider how often you should have your eyes checked. Personal lifestyle choices, including: Daily care of your teeth and gums. Regular physical activity. Eating a healthy diet. Avoiding tobacco and drug use. Limiting alcohol use. Practicing safe sex. Taking low-dose aspirin every day. Taking vitamin and mineral supplements as recommended by your health care provider. What happens during an annual well check? The services and screenings done by your health care provider  during your annual well check will depend on your age, overall health, lifestyle risk factors, and family history of disease. Counseling  Your health care provider may ask you questions about your: Alcohol use. Tobacco use. Drug use. Emotional well-being. Home and relationship well-being. Sexual activity. Eating habits. History of falls. Memory and ability to understand (cognition). Work and work Astronomer. Reproductive health. Screening  You may have the following tests or measurements: Height, weight, and BMI. Blood pressure. Lipid and cholesterol levels. These may be checked every 5 years, or more frequently if you are over 44 years old. Skin check. Lung cancer screening. You may have this screening every year starting at age 30 if you have a 30-pack-year history of smoking and currently smoke or have quit within the past 15 years. Fecal occult blood test (FOBT) of the stool. You may have this test every year starting at age 35. Flexible sigmoidoscopy or colonoscopy. You may have a sigmoidoscopy every 5 years or a colonoscopy every 10 years starting at age 60. Hepatitis C blood test. Hepatitis B blood test. Sexually transmitted disease (STD) testing. Diabetes screening. This is done by checking your blood sugar (glucose) after you have not eaten for a while (fasting). You may have this done every 1-3 years. Bone density scan. This is done to screen for osteoporosis. You may have this done starting at age 56. Mammogram. This may be done every 1-2 years. Talk to your health care provider about how often you should have regular mammograms. Talk with your health care provider about your test results, treatment options, and if  necessary, the need for more tests. Vaccines  Your health care provider may recommend certain vaccines, such as: Influenza vaccine. This is recommended every year. Tetanus, diphtheria, and acellular pertussis (Tdap, Td) vaccine. You may need a Td booster every  10 years. Zoster vaccine. You may need this after age 3. Pneumococcal 13-valent conjugate (PCV13) vaccine. One dose is recommended after age 2. Pneumococcal polysaccharide (PPSV23) vaccine. One dose is recommended after age 70. Talk to your health care provider about which screenings and vaccines you need and how often you need them. This information is not intended to replace advice given to you by your health care provider. Make sure you discuss any questions you have with your health care provider. Document Released: 04/23/2015 Document Revised: 12/15/2015 Document Reviewed: 01/26/2015 Elsevier Interactive Patient Education  2017 [employer].  Fall Prevention in the Home Falls can cause injuries. They can happen to people of all ages. There are many things you can do to make your home safe and to help prevent falls. What can I do on the outside of my home? Regularly fix the edges of walkways and driveways and fix any cracks. Remove anything that might make you trip as you walk through a door, such as a raised step or threshold. Trim any bushes or trees on the path to your home. Use bright outdoor lighting. Clear any walking paths of anything that might make someone trip, such as rocks or tools. Regularly check to see if handrails are loose or broken. Make sure that both sides of any steps have handrails. Any raised decks and porches should have guardrails on the edges. Have any leaves, snow, or ice cleared regularly. Use sand or salt on walking paths during winter. Clean up any spills in your garage right away. This includes oil or grease spills. What can I do in the bathroom? Use night lights. Install grab bars by the toilet and in the tub and shower. Do not use towel bars as grab bars. Use non-skid mats or decals in the tub or shower. If you need to sit down in the shower, use a plastic, non-slip stool. Keep the floor dry. Clean up any water that spills on the floor as soon as it  happens. Remove soap buildup in the tub or shower regularly. Attach bath mats securely with double-sided non-slip rug tape. Do not have throw rugs and other things on the floor that can make you trip. What can I do in the bedroom? Use night lights. Make sure that you have a light by your bed that is easy to reach. Do not use any sheets or blankets that are too big for your bed. They should not hang down onto the floor. Have a firm chair that has side arms. You can use this for support while you get dressed. Do not have throw rugs and other things on the floor that can make you trip. What can I do in the kitchen? Clean up any spills right away. Avoid walking on wet floors. Keep items that you use a lot in easy-to-reach places. If you need to reach something above you, use a strong step stool that has a grab bar. Keep electrical cords out of the way. Do not use floor polish or wax that makes floors slippery. If you must use wax, use non-skid floor wax. Do not have throw rugs and other things on the floor that can make you trip. What can I do with my stairs? Do not leave any items  on the stairs. Make sure that there are handrails on both sides of the stairs and use them. Fix handrails that are broken or loose. Make sure that handrails are as long as the stairways. Check any carpeting to make sure that it is firmly attached to the stairs. Fix any carpet that is loose or worn. Avoid having throw rugs at the top or bottom of the stairs. If you do have throw rugs, attach them to the floor with carpet tape. Make sure that you have a light switch at the top of the stairs and the bottom of the stairs. If you do not have them, ask someone to add them for you. What else can I do to help prevent falls? Wear shoes that: Do not have high heels. Have rubber bottoms. Are comfortable and fit you well. Are closed at the toe. Do not wear sandals. If you use a stepladder: Make sure that it is fully opened.  Do not climb a closed stepladder. Make sure that both sides of the stepladder are locked into place. Ask someone to hold it for you, if possible. Clearly mark and make sure that you can see: Any grab bars or handrails. First and last steps. Where the edge of each step is. Use tools that help you move around (mobility aids) if they are needed. These include: Canes. Walkers. Scooters. Crutches. Turn on the lights when you go into a dark area. Replace any light bulbs as soon as they burn out. Set up your furniture so you have a clear path. Avoid moving your furniture around. If any of your floors are uneven, fix them. If there are any pets around you, be aware of where they are. Review your medicines with your doctor. Some medicines can make you feel dizzy. This can increase your chance of falling. Ask your doctor what other things that you can do to help prevent falls. This information is not intended to replace advice given to you by your health care provider. Make sure you discuss any questions you have with your health care provider. Document Released: 01/21/2009 Document Revised: 09/02/2015 Document Reviewed: 05/01/2014 Elsevier Interactive Patient Education  2017 [employer].

## 2022-12-07 ENCOUNTER — Encounter (HOSPITAL_COMMUNITY): Payer: Self-pay | Admitting: Physician Assistant

## 2022-12-07 ENCOUNTER — Ambulatory Visit (HOSPITAL_COMMUNITY)
Admission: RE | Admit: 2022-12-07 | Discharge: 2022-12-07 | Disposition: A | Discharge status: Home / Self Care | Payer: Medicare Other | Source: Ambulatory Visit | Attending: Physician Assistant | Admitting: Physician Assistant

## 2022-12-07 VITALS — BP 126/76 | HR 56 | Ht 61.0 in | Wt 119.2 lb

## 2022-12-07 DIAGNOSIS — I099 Rheumatic heart disease, unspecified: Secondary | ICD-10-CM | POA: Diagnosis not present

## 2022-12-07 DIAGNOSIS — Z79899 Other long term (current) drug therapy: Secondary | ICD-10-CM | POA: Diagnosis not present

## 2022-12-07 DIAGNOSIS — Z952 Presence of prosthetic heart valve: Secondary | ICD-10-CM | POA: Insufficient documentation

## 2022-12-07 DIAGNOSIS — Z7901 Long term (current) use of anticoagulants: Secondary | ICD-10-CM | POA: Diagnosis not present

## 2022-12-07 DIAGNOSIS — Z5181 Encounter for therapeutic drug level monitoring: Secondary | ICD-10-CM

## 2022-12-07 DIAGNOSIS — I10 Essential (primary) hypertension: Secondary | ICD-10-CM | POA: Insufficient documentation

## 2022-12-07 DIAGNOSIS — I480 Paroxysmal atrial fibrillation: Secondary | ICD-10-CM | POA: Diagnosis not present

## 2022-12-07 DIAGNOSIS — R9431 Abnormal electrocardiogram [ECG] [EKG]: Secondary | ICD-10-CM | POA: Insufficient documentation

## 2022-12-07 DIAGNOSIS — D6869 Other thrombophilia: Secondary | ICD-10-CM

## 2022-12-07 LAB — BASIC METABOLIC PANEL
Anion gap: 10 (ref 5–15)
BUN: 16 mg/dL (ref 8–23)
CO2: 23 mmol/L (ref 22–32)
Calcium: 9.2 mg/dL (ref 8.9–10.3)
Chloride: 105 mmol/L (ref 98–111)
Creatinine, Ser: 0.89 mg/dL (ref 0.44–1.00)
GFR, Estimated: >60 mL/min (ref >60)
Glucose, Bld: 99 mg/dL (ref 70–99)
Potassium: 4.1 mmol/L (ref 3.5–5.1)
Sodium: 138 mmol/L (ref 135–145)

## 2022-12-07 LAB — MAGNESIUM: Magnesium: 2.2 mg/dL (ref 1.7–2.4)
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
## 2022-12-28 ENCOUNTER — Ambulatory Visit: Payer: Medicare Other | Admitting: Family Medicine

## 2022-12-28 DIAGNOSIS — I10 Essential (primary) hypertension: Secondary | ICD-10-CM | POA: Diagnosis not present

## 2022-12-28 DIAGNOSIS — D509 Iron deficiency anemia, unspecified: Secondary | ICD-10-CM

## 2022-12-28 DIAGNOSIS — Z23 Encounter for immunization: Secondary | ICD-10-CM | POA: Diagnosis not present

## 2022-12-28 DIAGNOSIS — Z79899 Other long term (current) drug therapy: Secondary | ICD-10-CM | POA: Diagnosis not present

## 2022-12-28 DIAGNOSIS — E7849 Other hyperlipidemia: Secondary | ICD-10-CM

## 2022-12-28 MED ORDER — AMLODIPINE BESYLATE 5 MG PO TABS: ORAL_TABLET | ORAL | 1 refills | Status: DC

## 2022-12-28 MED ORDER — PANTOPRAZOLE SODIUM 40 MG PO TBEC: DELAYED_RELEASE_TABLET | ORAL | 1 refills | Status: DC

## 2022-12-28 NOTE — Progress Notes (Signed)
Subjective:    Patient ID: Hannah Roy, female    DOB: Aug 17, 1940, 82 y.o.   MRN: [id number]  HPI Essential hypertension, benign - Plan: CBC with Differential  Iron deficiency anemia, unspecified iron deficiency anemia type - Plan: CBC with Differential, Iron, TIBC and Ferritin Panel  Other hyperlipidemia - Plan: Lipid Panel  High risk medication use - Plan: CBC with Differential, Hepatic Function Panel  Immunization due - Plan: Flu Vaccine Trivalent High Dose (Fluad)  She is doing well taking her medicine she states her heart has been doing well denies any fast heart rhythm no syncope no lightheadedness no falls she does take her other medicines tolerating them well she is worried about the possibility anemia because sometimes she states she has low energy she has a history of iron deficient anemia   Review of Systems     Objective:   Physical Exam  General-in no acute distress Eyes-no discharge Lungs-respiratory rate normal, CTA CV-no murmurs Extremities skin warm dry no edema Neuro grossly normal Behavior normal, alert       Assessment & Plan:  1. Essential hypertension, benign Blood pressure is good continue current measures - CBC with Differential  2. Iron deficiency anemia, unspecified iron deficiency anemia type History of iron deficient anemia along with fatigue check lab work await results - CBC with Differential - Iron, TIBC and Ferritin Panel  3. Other hyperlipidemia Healthy diet continue medication - Lipid Panel  4. High risk medication use Stable currently - CBC with Differential - Hepatic Function Panel  5. Immunization due Flu shot today - Flu Vaccine Trivalent High Dose (Fluad)  Follow-up within 6 months sooner problems

## 2022-12-29 ENCOUNTER — Encounter: Payer: Self-pay | Admitting: Family Medicine

## 2022-12-29 LAB — CBC WITH DIFFERENTIAL/PLATELET
Basophils Absolute: 0 10*3/uL (ref 0.0–0.2)
Basos: 1 %
EOS (ABSOLUTE): 0.1 10*3/uL (ref 0.0–0.4)
Eos: 2 %
Hematocrit: 37.3 % (ref 34.0–46.6)
Hemoglobin: 12.4 g/dL (ref 11.1–15.9)
Immature Grans (Abs): 0 10*3/uL (ref 0.0–0.1)
Immature Granulocytes: 0 %
Lymphocytes Absolute: 1.6 10*3/uL (ref 0.7–3.1)
Lymphs: 30 %
MCH: 32.2 pg (ref 26.6–33.0)
MCHC: 33.2 g/dL (ref 31.5–35.7)
MCV: 97 fL (ref 79–97)
Monocytes Absolute: 0.5 10*3/uL (ref 0.1–0.9)
Monocytes: 9 %
Neutrophils Absolute: 3.2 10*3/uL (ref 1.4–7.0)
Neutrophils: 58 %
Platelets: 172 10*3/uL (ref 150–450)
RBC: 3.85 x10E6/uL (ref 3.77–5.28)
RDW: 12.2 % (ref 11.7–15.4)
WBC: 5.5 10*3/uL (ref 3.4–10.8)

## 2022-12-29 LAB — LIPID PANEL
Chol/HDL Ratio: 2.5 ratio (ref 0.0–4.4)
Cholesterol, Total: 149 mg/dL (ref 100–199)
HDL: 60 mg/dL (ref >39)
LDL Chol Calc (NIH): 73 mg/dL (ref 0–99)
Triglycerides: 86 mg/dL (ref 0–149)
VLDL Cholesterol Cal: 16 mg/dL (ref 5–40)

## 2022-12-29 LAB — HEPATIC FUNCTION PANEL
ALT: 12 IU/L (ref 0–32)
AST: 16 IU/L (ref 0–40)
Albumin: 4.9 g/dL — ABNORMAL HIGH (ref 3.8–4.8)
Alkaline Phosphatase: 76 IU/L (ref 44–121)
Bilirubin Total: 0.9 mg/dL (ref 0.0–1.2)
Bilirubin, Direct: 0.23 mg/dL (ref 0.00–0.40)
Total Protein: 7.8 g/dL (ref 6.0–8.5)

## 2022-12-29 LAB — IRON,TIBC AND FERRITIN PANEL
Ferritin: 198 ng/mL — ABNORMAL HIGH (ref 15–150)
Iron Saturation: 37 % (ref 15–55)
Iron: 98 ug/dL (ref 27–139)
Total Iron Binding Capacity: 264 ug/dL (ref 250–450)
UIBC: 166 ug/dL (ref 118–369)

## 2022-12-29 NOTE — Progress Notes (Signed)
Please mail to the patient

## 2023-01-03 ENCOUNTER — Other Ambulatory Visit (HOSPITAL_COMMUNITY): Payer: Self-pay | Admitting: Family Medicine

## 2023-01-03 DIAGNOSIS — Z1231 Encounter for screening mammogram for malignant neoplasm of breast: Secondary | ICD-10-CM

## 2023-01-04 ENCOUNTER — Telehealth: Payer: Self-pay

## 2023-01-04 NOTE — Telephone Encounter (Signed)
Pt wants copy of her last lab work mailed to her.

## 2023-01-11 ENCOUNTER — Emergency Department (HOSPITAL_COMMUNITY): Payer: Medicare Other

## 2023-01-11 ENCOUNTER — Emergency Department (HOSPITAL_COMMUNITY)
Admission: EM | Admit: 2023-01-11 | Discharge: 2023-01-11 | Disposition: A | Discharge status: Home / Self Care | Payer: Medicare Other | Attending: Emergency Medicine | Admitting: Emergency Medicine

## 2023-01-11 ENCOUNTER — Other Ambulatory Visit: Payer: Self-pay

## 2023-01-11 ENCOUNTER — Encounter (HOSPITAL_COMMUNITY): Payer: Self-pay

## 2023-01-11 DIAGNOSIS — S8002XA Contusion of left knee, initial encounter: Secondary | ICD-10-CM | POA: Diagnosis not present

## 2023-01-11 DIAGNOSIS — Y92524 Gas station as the place of occurrence of the external cause: Secondary | ICD-10-CM | POA: Diagnosis not present

## 2023-01-11 DIAGNOSIS — S0003XA Contusion of scalp, initial encounter: Secondary | ICD-10-CM | POA: Diagnosis not present

## 2023-01-11 DIAGNOSIS — S60222A Contusion of left hand, initial encounter: Secondary | ICD-10-CM | POA: Insufficient documentation

## 2023-01-11 DIAGNOSIS — Z91040 Latex allergy status: Secondary | ICD-10-CM | POA: Diagnosis not present

## 2023-01-11 DIAGNOSIS — W010XXA Fall on same level from slipping, tripping and stumbling without subsequent striking against object, initial encounter: Secondary | ICD-10-CM | POA: Insufficient documentation

## 2023-01-11 DIAGNOSIS — I4891 Unspecified atrial fibrillation: Secondary | ICD-10-CM | POA: Diagnosis not present

## 2023-01-11 DIAGNOSIS — S0990XA Unspecified injury of head, initial encounter: Secondary | ICD-10-CM | POA: Insufficient documentation

## 2023-01-11 DIAGNOSIS — M79602 Pain in left arm: Secondary | ICD-10-CM | POA: Diagnosis not present

## 2023-01-11 DIAGNOSIS — S199XXA Unspecified injury of neck, initial encounter: Secondary | ICD-10-CM | POA: Diagnosis not present

## 2023-01-11 DIAGNOSIS — Z7901 Long term (current) use of anticoagulants: Secondary | ICD-10-CM | POA: Diagnosis not present

## 2023-01-11 DIAGNOSIS — S2232XA Fracture of one rib, left side, initial encounter for closed fracture: Secondary | ICD-10-CM | POA: Insufficient documentation

## 2023-01-11 DIAGNOSIS — S299XXA Unspecified injury of thorax, initial encounter: Secondary | ICD-10-CM | POA: Diagnosis present

## 2023-01-11 DIAGNOSIS — M25562 Pain in left knee: Secondary | ICD-10-CM | POA: Diagnosis not present

## 2023-01-11 DIAGNOSIS — M25462 Effusion, left knee: Secondary | ICD-10-CM | POA: Insufficient documentation

## 2023-01-11 DIAGNOSIS — M79632 Pain in left forearm: Secondary | ICD-10-CM | POA: Diagnosis not present

## 2023-01-11 DIAGNOSIS — M79642 Pain in left hand: Secondary | ICD-10-CM | POA: Diagnosis not present

## 2023-01-11 DIAGNOSIS — M19042 Primary osteoarthritis, left hand: Secondary | ICD-10-CM | POA: Diagnosis not present

## 2023-01-11 DIAGNOSIS — R0781 Pleurodynia: Secondary | ICD-10-CM | POA: Diagnosis not present

## 2023-01-11 LAB — CBC WITH DIFFERENTIAL/PLATELET
Abs Immature Granulocytes: 0.04 10*3/uL (ref 0.00–0.07)
Basophils Absolute: 0 10*3/uL (ref 0.0–0.1)
Basophils Relative: 1 %
Eosinophils Absolute: 0.1 10*3/uL (ref 0.0–0.5)
Eosinophils Relative: 1 %
HCT: 38.3 % (ref 36.0–46.0)
Hemoglobin: 12.4 g/dL (ref 12.0–15.0)
Immature Granulocytes: 1 %
Lymphocytes Relative: 18 %
Lymphs Abs: 1.2 10*3/uL (ref 0.7–4.0)
MCH: 31.5 pg (ref 26.0–34.0)
MCHC: 32.4 g/dL (ref 30.0–36.0)
MCV: 97.2 fL (ref 80.0–100.0)
Monocytes Absolute: 0.4 10*3/uL (ref 0.1–1.0)
Monocytes Relative: 6 %
Neutro Abs: 4.9 10*3/uL (ref 1.7–7.7)
Neutrophils Relative %: 73 %
Platelets: 157 10*3/uL (ref 150–400)
RBC: 3.94 MIL/uL (ref 3.87–5.11)
RDW: 11.9 % (ref 11.5–15.5)
WBC: 6.7 10*3/uL (ref 4.0–10.5)
nRBC: 0 % (ref 0.0–0.2)

## 2023-01-11 LAB — BASIC METABOLIC PANEL
Anion gap: 9 (ref 5–15)
BUN: 11 mg/dL (ref 8–23)
CO2: 27 mmol/L (ref 22–32)
Calcium: 9.5 mg/dL (ref 8.9–10.3)
Chloride: 107 mmol/L (ref 98–111)
Creatinine, Ser: 0.75 mg/dL (ref 0.44–1.00)
GFR, Estimated: >60 mL/min (ref >60)
Glucose, Bld: 127 mg/dL — ABNORMAL HIGH (ref 70–99)
Potassium: 4 mmol/L (ref 3.5–5.1)
Sodium: 143 mmol/L (ref 135–145)

## 2023-01-11 MED ORDER — HYDROCODONE-ACETAMINOPHEN 5-325 MG PO TABS: 1.0000 | ORAL_TABLET | Freq: Four times a day (QID) | ORAL | 0 refills | Status: DC | PRN

## 2023-01-11 MED ORDER — FENTANYL CITRATE PF 50 MCG/ML IJ SOSY
50.0000 ug | PREFILLED_SYRINGE | Freq: Once | INTRAMUSCULAR | Status: AC
  Administered 2023-01-11: 50 ug via INTRAVENOUS
  Filled 2023-01-11: qty 1

## 2023-01-11 MED ORDER — HYDROCODONE-ACETAMINOPHEN 5-325 MG PO TABS
1.0000 | ORAL_TABLET | Freq: Once | ORAL | Status: AC
  Administered 2023-01-11: 1 via ORAL
  Filled 2023-01-11: qty 1

## 2023-01-11 NOTE — ED Triage Notes (Signed)
C/o mechanical fall at gas station hitting head.  Pt reports headache with bruising noted to front of head and left knee pain with swelling and bruising.  A&O x4 and ambulatory to triage Pt is currently on eliquis.

## 2023-01-11 NOTE — ED Notes (Signed)
Ice pack provided

## 2023-01-11 NOTE — Discharge Instructions (Signed)
It was a pleasure taking care of you today.  Your CT scan thankfully did not show any bleeding in your brain or fractures in your neck.  You do have 1 rib fracture on the left of your fourth rib.  We are going to treat your pain with hydrocodone.  It is important to make sure you are taking deep breaths and making sure you coughed several times a day.  Make sure you control the pain well, if you are not able to take deep breaths due to pain you can develop pneumonia.'s important for you to follow-up closely with your primary care doctor.  You do not have any fractures of your hand forearm or upper arm.  You were given a sling to use as needed to support the arm.  Also given a knee brace because you have a lot of swelling in the knee.  Follow-up with orthopedics.  Come back to the ER if you have new or worsening symptoms.

## 2023-01-11 NOTE — ED Provider Notes (Signed)
[place] [facility] AT [facility] Provider Note   CSN: [id number] Arrival date & time: 01/11/23  1213     History  Chief Complaint  Patient presents with   Marletta Lor    Hannah Roy is a 82 y.o. female.  History of A-fib, on Eliquis and Tikosyn.  Had mechanical fall today gas station, out of her car at the gas pump and tripped off the curb and fell into the gas pump and hit the concrete.  Complaining of right forehead contusion and swelling as well as left chest pain on deep inspiration, bruising to left hand and swelling bruising to left knee.  No loss consciousness, no nausea or vomiting, no neck pain.  No numbness tingling or weakness.  Able to bear weight on her left side.  No hip pain.  No back pain.   Fall       Home Medications Prior to Admission medications   Medication Sig Start Date End Date Taking? Authorizing Provider  acetaminophen (TYLENOL) 325 MG tablet Take 1-2 tablets (325-650 mg total) by mouth every 4 (four) hours as needed for mild pain. 03/18/20  Yes Love, Evlyn Kanner, PA-C  amLODipine (NORVASC) 5 MG tablet TAKE ONE TABLET BY MOUTH IN THE EVENING. 12/28/22  Yes Luking, Jonna Coup, MD  apixaban (ELIQUIS) 2.5 MG TABS tablet Take 1 tablet (2.5 mg total) by mouth 2 (two) times daily. 10/05/22  Yes Fenton, Clint R, PA  dofetilide (TIKOSYN) 250 MCG capsule Take 1 capsule (250 mcg total) by mouth 2 (two) times daily. 09/05/22  Yes Fenton, Clint R, PA  HYDROcodone-acetaminophen (NORCO) 5-325 MG tablet Take 1 tablet by mouth every 6 (six) hours as needed. 01/11/23  Yes Porcia Morganti A, PA-C  metoprolol tartrate (LOPRESSOR) 25 MG tablet TAKE ONE TABLET BY MOUTH TWICE A DAY 09/20/22  Yes Luking, Jonna Coup, MD  nitroGLYCERIN (NITROSTAT) 0.4 MG SL tablet Place 1 tablet (0.4 mg total) under the tongue every 5 (five) minutes x 3 doses as needed for chest pain. 02/24/22  Yes Strader, [place] M, PA-C  pantoprazole (PROTONIX) 40 MG tablet TAKE ONE TABLET (40MG  TOTAL) BY  MOUTH DAILY BEFORE BREAKFAST 12/28/22  Yes Luking, Scott A, MD  potassium chloride (KLOR-CON) 10 MEQ tablet TAKE 2 TABLETS (20 MEQ TOTAL) BY MOUTH DAILY. 04/25/22 04/20/23 Yes Jonelle Sidle, MD  rosuvastatin (CRESTOR) 10 MG tablet TAKE ONE TABLET (10MG  TOTAL) BY MOUTH DAILY 06/23/22  Yes Jonelle Sidle, MD  valACYclovir (VALTREX) 1000 MG tablet TAKE TWO TABLETS BY MOUTH TWO TIMES A DAY AS NEEDED FOR FEVER BLISTERS Patient not taking: Reported on 01/11/2023 04/25/22   Babs Sciara, MD      Allergies    Bee venom, Other, Boniva [ibandronic acid], Lisinopril, and Latex    Review of Systems   Review of Systems  Physical Exam Updated Vital Signs BP (!) 151/77   Pulse 69   Temp 98.6 F (37 C) (Oral)   Resp 18   Wt 54 kg   SpO2 97%   BMI 22.49 kg/m  Physical Exam Vitals and nursing note reviewed.  Constitutional:      General: She is not in acute distress.    Appearance: She is well-developed.  HENT:     Head: Normocephalic and atraumatic.  Eyes:     Conjunctiva/sclera: Conjunctivae normal.  Cardiovascular:     Rate and Rhythm: Normal rate and regular rhythm.     Heart sounds: No murmur heard. Pulmonary:  Effort: Pulmonary effort is normal. No respiratory distress.     Breath sounds: Normal breath sounds.  Chest:     Chest wall: Tenderness present.       Comments: Tenderness and crepitus over left anterior chest area fourth rib.  No overlying bruising abrasions or lacerations. Abdominal:     Palpations: Abdomen is soft.     Tenderness: There is no abdominal tenderness.  Musculoskeletal:        General: Swelling present.     Cervical back: Neck supple.     Comments: Bruising to hand over the little finger and fifth metacarpal with mild swelling over left forearm medial with bruising.  Mild tenderness to left proximal humerus.  Normal range of motion of left shoulder elbow and wrist without difficulty.  Pulses on left upper extremity intact.  Left knee has large  effusion, range of motion intact, distal pulses intact.  Skin:    General: Skin is warm and dry.     Capillary Refill: Capillary refill takes less than 2 seconds.  Neurological:     Mental Status: She is alert.  Psychiatric:        Mood and Affect: Mood normal.     ED Results / Procedures / Treatments   Labs (all labs ordered are listed, but only abnormal results are displayed) Labs Reviewed  BASIC METABOLIC PANEL - Abnormal; Notable for the following components:      Result Value   Glucose, Bld 127 (*)    All other components within normal limits  CBC WITH DIFFERENTIAL/PLATELET    EKG None  Radiology CT Chest Wo Contrast  Result Date: 01/11/2023 CLINICAL DATA:  Patient fell. Hematoma to right forehead and bruising to left knee. Rib pain. EXAM: CT CHEST WITHOUT CONTRAST TECHNIQUE: Multidetector CT imaging of the chest was performed following the standard protocol without IV contrast. RADIATION DOSE REDUCTION: This exam was performed according to the departmental dose-optimization program which includes automated exposure control, adjustment of the mA and/or kV according to patient size and/or use of iterative reconstruction technique. COMPARISON:  Chest radiograph 02/05/2021 and report from CT chest 03/04/2020 and images from CTA chest 06/26/2019 FINDINGS: Cardiovascular: Cardiomegaly. No pericardial effusion. Sternotomy. Tricuspid, aortic and mitral valve replacement. Graft repair of the ascending aorta. Mediastinum/Nodes: Trachea and esophagus are unremarkable. No concerning lymphadenopathy. Lungs/Pleura: No focal consolidation, pleural effusion, or pneumothorax. Cyst in the posterior left lower lobe. Upper Abdomen: No acute abnormality. Musculoskeletal: Acute minimally displaced fracture of the anterior left fourth rib. IMPRESSION: Acute minimally displaced fracture of the anterior left fourth rib. No pneumothorax. Electronically Signed   By: Minerva Fester M.D.   On: 01/11/2023 17:07    DG Forearm Left  Result Date: 01/11/2023 CLINICAL DATA:  Pain after fall. EXAM: LEFT FOREARM - 2 VIEW COMPARISON:  None Available. FINDINGS: There is no evidence of fracture or other focal bone lesions. Wrist and elbow alignment are maintained. Soft tissues are unremarkable. IMPRESSION: Negative radiographs of the left forearm. Electronically Signed   By: Narda Rutherford M.D.   On: 01/11/2023 16:32   DG Hand 2 View Left  Result Date: 01/11/2023 CLINICAL DATA:  Pain after fall. EXAM: LEFT HAND - 2 VIEW COMPARISON:  None Available. FINDINGS: Assessment of the digits on the lateral view is limited due to osseous overlap. No evidence of acute fracture or dislocation. 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 Degenerative changes of the interphalangeal joint of the thumb and distal interphalangeal joint of the digits. No focal soft tissue  abnormalities. IMPRESSION: No acute fracture or dislocation of the left hand. Electronically Signed   By: Narda Rutherford M.D.   On: 01/11/2023 16:28   DG Knee Complete 4 Views Left  Result Date: 01/11/2023 CLINICAL DATA:  Left knee pain after fall. EXAM: LEFT KNEE - COMPLETE 4+ VIEW COMPARISON:  None Available. FINDINGS: No evidence of fracture, dislocation, or joint effusion. The joint spaces and alignment are preserved. Large amount of prepatellar soft tissue edema and thickening. No radiopaque foreign body. IMPRESSION: Large amount of prepatellar soft tissue edema and thickening. No fracture, dislocation, or joint effusion. Electronically Signed   By: Narda Rutherford M.D.   On: 01/11/2023 16:27   DG Humerus Left  Result Date: 01/11/2023 CLINICAL DATA:  Left upper extremity pain after fall. EXAM: LEFT HUMERUS - 2+ VIEW COMPARISON:  None Available. FINDINGS: There is no evidence of fracture or other focal bone lesions. Shoulder and elbow alignment are maintained. Soft tissues are unremarkable. IMPRESSION: Negative radiographs of the left humerus. Electronically  Signed   By: Narda Rutherford M.D.   On: 01/11/2023 16:26   CT Head Wo Contrast  Result Date: 01/11/2023 CLINICAL DATA:  Trauma EXAM: CT HEAD WITHOUT CONTRAST CT CERVICAL SPINE WITHOUT CONTRAST TECHNIQUE: Multidetector CT imaging of the head and cervical spine was performed following the standard protocol without intravenous contrast. Multiplanar CT image reconstructions of the cervical spine were also generated. RADIATION DOSE REDUCTION: This exam was performed according to the departmental dose-optimization program which includes automated exposure control, adjustment of the mA and/or kV according to patient size and/or use of iterative reconstruction technique. COMPARISON:  CT Head 10/10/20 FINDINGS: CT HEAD FINDINGS Brain: No evidence of acute infarction, hemorrhage, hydrocephalus, extra-axial collection or mass lesion/mass effect. Vascular: No hyperdense vessel or unexpected calcification. Skull: Soft hematoma along the frontal scalp. No evidence of underlying calvarial fracture. Sinuses/Orbits: Mucosal thickening of the floor of bilateral maxillary sinuses. Middle ear or mastoid effusion. Bilateral lens replacement. Are otherwise unremarkable. Other: None. CT CERVICAL SPINE FINDINGS Alignment: Trace retrolisthesis of C4 on C5 and C5 on C6. Skull base and vertebrae: No acute fracture. No primary bone lesion or focal pathologic process. Soft tissues and spinal canal: No prevertebral fluid or swelling. No visible canal hematoma. Disc levels:  No evidence of high-grade spinal canal stenosis. Upper chest: Negative. Other: None IMPRESSION: 1. No acute intracranial abnormality. 2. Soft hematoma along the frontal scalp. No evidence of underlying calvarial fracture. 3. No acute fracture or traumatic malalignment of the cervical spine. Electronically Signed   By: Lorenza Cambridge M.D.   On: 01/11/2023 15:32   CT Cervical Spine Wo Contrast  Result Date: 01/11/2023 CLINICAL DATA:  Trauma EXAM: CT HEAD WITHOUT CONTRAST  CT CERVICAL SPINE WITHOUT CONTRAST TECHNIQUE: Multidetector CT imaging of the head and cervical spine was performed following the standard protocol without intravenous contrast. Multiplanar CT image reconstructions of the cervical spine were also generated. RADIATION DOSE REDUCTION: This exam was performed according to the departmental dose-optimization program which includes automated exposure control, adjustment of the mA and/or kV according to patient size and/or use of iterative reconstruction technique. COMPARISON:  CT Head 10/10/20 FINDINGS: CT HEAD FINDINGS Brain: No evidence of acute infarction, hemorrhage, hydrocephalus, extra-axial collection or mass lesion/mass effect. Vascular: No hyperdense vessel or unexpected calcification. Skull: Soft hematoma along the frontal scalp. No evidence of underlying calvarial fracture. Sinuses/Orbits: Mucosal thickening of the floor of bilateral maxillary sinuses. Middle ear or mastoid effusion. Bilateral lens replacement. Are otherwise unremarkable. Other: None.  CT CERVICAL SPINE FINDINGS Alignment: Trace retrolisthesis of C4 on C5 and C5 on C6. Skull base and vertebrae: No acute fracture. No primary bone lesion or focal pathologic process. Soft tissues and spinal canal: No prevertebral fluid or swelling. No visible canal hematoma. Disc levels:  No evidence of high-grade spinal canal stenosis. Upper chest: Negative. Other: None IMPRESSION: 1. No acute intracranial abnormality. 2. Soft hematoma along the frontal scalp. No evidence of underlying calvarial fracture. 3. No acute fracture or traumatic malalignment of the cervical spine. Electronically Signed   By: Lorenza Cambridge M.D.   On: 01/11/2023 15:32    Procedures Procedures    Medications Ordered in ED Medications  fentaNYL (SUBLIMAZE) injection 50 mcg (50 mcg Intravenous Given 01/11/23 1524)  HYDROcodone-acetaminophen (NORCO/VICODIN) 5-325 MG per tablet 1 tablet (1 tablet Oral Given 01/11/23 1806)    ED Course/  Medical Decision Making/ A&P                                 Medical Decision Making DDx: Hemorrhage, fracture, pneumothorax, sprain, strain, other  ED course: Patient mechanical fall tripping over a curb at a gas station and hit the right side of her head, her left anterior chest, left arm and left knee.  She does have large amount of swelling to the left knee but no fractures on x-ray, able to bear weight, normal peripheral pulses and perfusion.  Bruising to the left hand and wrist as well but normal range of motion, minimal tenderness and normal x-rays of left hand, forearm and humerus.  CT scan head and C-spine negative for acute intracranial hemorrhage or traumatic malalignment of the C-spine.  CT chest without contrast shows no pneumothorax, isolated left anterior fourth rib fracture which is patient's area of discomfort.  Patient feeling better after fentanyl.  Given knee brace to the left knee, and sling to use as needed for left arm.  Sent home with Norco and instructed this can make her sleepy, be careful.  Her daughter is at bedside upon discharge who is going to help her get home, patient has stairs in her home but does have a stair lift to get herself up stairs and her daughter is going to continue helping her.  Advised on follow-up and strict return precautions.  Amount and/or Complexity of Data Reviewed Labs: ordered. Radiology: ordered.  Risk Prescription drug management.           Final Clinical Impression(s) / ED Diagnoses Final diagnoses:  Injury of head, initial encounter  Closed fracture of one rib of left side, initial encounter  Knee effusion, left  Contusion of left hand, initial encounter    Rx / DC Orders ED Discharge Orders          Ordered    HYDROcodone-acetaminophen (NORCO) 5-325 MG tablet  Every 6 hours PRN        01/11/23 1753              Ma Rings, PA-C 01/11/23 1947    Vanetta Mulders, MD 01/16/23 [phone]

## 2023-01-23 ENCOUNTER — Encounter: Payer: Self-pay | Admitting: Orthopedic Surgery

## 2023-01-23 ENCOUNTER — Other Ambulatory Visit: Payer: Self-pay | Admitting: *Deleted

## 2023-01-23 ENCOUNTER — Ambulatory Visit: Payer: Medicare Other | Admitting: Orthopedic Surgery

## 2023-01-23 ENCOUNTER — Telehealth: Payer: Self-pay | Admitting: *Deleted

## 2023-01-23 VITALS — BP 129/79 | HR 65 | Ht 61.0 in | Wt 116.8 lb

## 2023-01-23 DIAGNOSIS — W19XXXA Unspecified fall, initial encounter: Secondary | ICD-10-CM

## 2023-01-23 DIAGNOSIS — M25562 Pain in left knee: Secondary | ICD-10-CM

## 2023-01-23 MED ORDER — PREDNISONE 10 MG (21) PO TBPK: ORAL_TABLET | ORAL | 0 refills | Status: DC

## 2023-01-23 MED ORDER — APIXABAN 2.5 MG PO TABS: 2.5000 mg | ORAL_TABLET | Freq: Two times a day (BID) | ORAL | 0 refills | Status: DC

## 2023-01-23 NOTE — Patient Instructions (Addendum)
Focus on range of motion of the left knee  Medicines as prescribed  Topical treatments  Ice the knee as needed  Follow up in 2 weeks
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
## 2023-01-23 NOTE — Telephone Encounter (Signed)
Pt walked in office requesting samples.

## 2023-01-24 ENCOUNTER — Ambulatory Visit: Payer: Medicare Other | Admitting: Family Medicine

## 2023-01-24 ENCOUNTER — Ambulatory Visit (HOSPITAL_COMMUNITY): Payer: Medicare Other

## 2023-01-24 VITALS — BP 130/80 | HR 75 | Temp 98.2°F | Ht 61.0 in | Wt 117.4 lb

## 2023-01-24 DIAGNOSIS — M25562 Pain in left knee: Secondary | ICD-10-CM | POA: Diagnosis not present

## 2023-01-24 DIAGNOSIS — S060X0D Concussion without loss of consciousness, subsequent encounter: Secondary | ICD-10-CM | POA: Diagnosis not present

## 2023-01-24 DIAGNOSIS — T148XXA Other injury of unspecified body region, initial encounter: Secondary | ICD-10-CM

## 2023-01-24 NOTE — Progress Notes (Signed)
Subjective:    Patient ID: Hannah Roy, female    DOB: February 07, 1941, 82 y.o.   MRN: [id number]  Discussed the use of AI scribe software for clinical note transcription with the patient, who gave verbal consent to proceed.  History of Present Illness   The patient, an 82 year old with a history of brain hemorrhage, presented with symptoms following a fall at a gas station. The fall was precipitated by a gap in the pavement, causing the patient to trip and fall forward, hitting her head on the concrete near a gas pump. The patient reported a sensation of her head 'bouncing' and seeing 'stars,' suggesting a significant impact. A bystander assisted the patient to her feet and confirmed the cause of the fall.  Following the fall, the patient developed a large hematoma on her right forehead, bruising under both eyes, and a bruise on her left lower wrist where her watch was. She also reported a scratch on her right wrist. The patient noted that her vision has become 'fuzzy' since the fall, and she has been experiencing ringing in her ears. She also reported feeling lightheaded and nauseous, symptoms consistent with a concussion.  The patient also sustained injuries to her left knee during the fall, resulting in significant bruising and a hematoma. The bruising has spread to the lower leg, changing color as it begins to heal. The patient also reported a cracked rib on her right side, confirmed by a CT scan.  The patient has been experiencing difficulty with mobility since the fall, moving slowly and feeling 'wheezy.' She has been managing her nausea by eating small amounts throughout the day. The patient reported a weight loss of four pounds since the incident.     Patient took a substantial fall.  She struck her head.  She did not get knocked out.  But ever since then has been having a feeling of dizziness some slight nausea and just not feeling well.  She had x-rays and scans while in the hospital.  She  denied any breathing issues but does relate chest wall pain from rib fracture    Review of Systems     Objective:    Physical Exam   VITALS: BP- 114/78 HEENT: Right forehead with large hematoma and scab, bilateral periorbital bruising, mild tenderness in cheekbones. Both tympanic membranes intact without hemotympanum. Vision described as blurry. CHEST: Clear to auscultation bilaterally. Right-sided rib fracture. CARDIOVASCULAR: Regular rate and rhythm on auscultation. MUSCULOSKELETAL: Right wrist with reduced strength. Good range of motion in both wrists. Left knee with large hematoma and discoloration. Left lower wrist and hand with bruising.     She has extensive amount of bruising on the left leg around the left knee she also has some bruising in the left arm and in the facial area and a large hematoma on the forehead with scabbing no sign of infection      Assessment & Plan:  Assessment and Plan    Fall with Head Trauma Sustained a fall resulting in a large hematoma on the right forehead with a scab, bruising under both eyes, and a laceration on the right wrist. Patient reports feeling lightheaded and woozy, consistent with a concussion. No signs of intracranial bleeding on CT scan. -Apply gentle warm compresses to the hematoma. -Encouraged small, frequent meals to manage nausea. -Consider use of a walking stick or adjustable hiking pole for stability when walking. -Follow-up in 3-4 weeks to monitor recovery.  Left Knee Trauma Large amount of bruising  and a hematoma on the left knee from the fall. Bruising extending to the lower leg. -Allow natural healing process to occur over the next 4-6 weeks.  Rib Fracture Sustained a cracked rib on the right side from the fall. No signs of internal injury. -Allow natural healing process to occur.  Orthostatic Hypotension Blood pressure drops upon standing, contributing to lightheadedness. -Advise patient to move legs and stand up  slowly to prevent sudden blood pressure drops.  Follow-up in 3-4 weeks to monitor recovery from the fall and concussion.      Can caution-no major intervention necessary currently other than for her to be careful to avoid another fall I did discuss with her about getting a hiking stick in addition to this she will follow-up 3 to 4 weeks to recheck.  I do not feel that she needs to have a repeat scan at this point. We did discuss proper balance

## 2023-01-24 NOTE — Patient Instructions (Signed)
Concussion, Adult  A concussion is a brain injury from a hard, direct hit (trauma) to the head or body. This direct hit causes the brain to shake quickly back and forth inside the skull. This can damage brain cells and cause chemical changes in the brain. A concussion may also be known as a mild traumatic brain injury (TBI). The effects of a concussion can be serious. If you have a concussion, you should be very careful to avoid having a second concussion. What are the causes? This condition is caused by: A direct hit to your head. Sudden movement of your body that causes your brain to move back and forth inside the skull, such as in a car crash. What are the signs or symptoms? The signs of a concussion can be hard to notice. Early on, they may be missed by you, family members, and health care providers. You may look fine on the outside but may act or feel differently. Every head injury is different. Symptoms are usually temporary but may last for days, weeks, or even months. Some symptoms appear right away, but other symptoms may not show up for hours or days. Physical symptoms Headaches. Dizziness and problems with coordination or balance. Sensitivity to light or noise. Nausea or vomiting. Tiredness (fatigue). Vision or hearing problems. Seizure. Mental and emotional symptoms Irritability or mood changes. Memory problems. Trouble concentrating, organizing, or making decisions. Changes in eating or sleeping patterns. Slowness in thinking, acting or reacting, speaking, or reading. Anxiety or depression. How is this diagnosed? This condition is diagnosed based on your symptoms and injury. You may also have tests, including: Imaging tests, such as a CT scan or an MRI. Neuropsychological tests. These measure your thinking, understanding, learning, and memory. How is this treated? Treatment for this condition includes: Stopping sports or activity if you are injured. Physical and mental  rest and careful observation, usually at home. Medicines to help with symptoms such as headaches, nausea, or difficulty sleeping. Referral to a concussion clinic or rehab center. Follow these instructions at home: Activity Limit activities that require a lot of thought or concentration, such as: Doing homework or job-related work. Watching TV. Using the computer or phone. Playing memory games and doing puzzles. Rest helps your brain heal. Make sure you: Get plenty of sleep. Most adults should get 7-9 hours of sleep each night. Rest during the day. Take naps or rest breaks when you feel tired. Avoid high-intensity exercise or physical activities that take a lot of effort. Stop any activity that worsens symptoms. Your health care provider may recommend light exercise such as walking. Do not do high-risk activities that could cause a second concussion, such as riding a bike or playing sports. Ask your health care provider when you can return to your normal activities, such as school, work, sports, and driving. Your ability to react may be slower after a brain injury. Never do these activities if you are dizzy. General instructions  Take over-the-counter and prescription medicines only as told by your health care provider. Some medicines, such as blood thinners (anticoagulants) and aspirin, may increase the risk for complications, such as bleeding. Avoid taking opioid pain medicine while recovering from a concussion. Do not drink alcohol until your health care provider says you can. Drinking alcohol may slow your recovery and can put you at risk of further injury. Watch your symptoms and tell others around you to do the same. Complications sometimes occur after a concussion. Tell your work Production designer, theatre/television/film, teachers, Tax adviser,  school counselor, coach, or sports trainer about your injury, symptoms, and restrictions. See a mental health therapist if you feel anxious or depressed. Managing this condition  can be challenging. Keep all follow-up visits. Your health care provider will check on your recovery and give you a plan for returning to activities. How is this prevented? Avoiding another brain injury is very important. In rare cases, another injury can lead to permanent brain damage, brain swelling, or death. The risk of this is greatest during the first 7-10 days after a head injury. Avoid injuries by: Stopping activities that could lead to a second concussion, such as contact or recreational sports, until your health care provider says it is okay. Taking these actions once you have returned to sports or activities: Avoid plays or moves that can cause you to crash into another person. This is how most concussions occur. Follow the rules and be respectful of other players. Do not engage in violent or illegal plays. Getting regular exercise that includes strength and balance training. Wearing a properly fitting helmet during sports, biking, or other activities. Helmets can help protect you from serious skull and brain injuries, but they may not protect you from a concussion. Even when wearing a helmet, you should avoid being hit in the head. Where to find more information Centers for Disease Control and Prevention: TonerPromos.no Contact a health care provider if: Your symptoms do not improve or get worse. You have new symptoms. You have another injury. Your coordination gets worse. You have unusual behavior changes. Get help right away if: You have a severe or worsening headache. You have weakness or numbness in any part of your body, slurred speech, vision changes, or confusion. You vomit repeatedly. You lose consciousness, are sleepier than normal, or are difficult to wake up. You have a seizure. These symptoms may be an emergency. Get help right away. Call 911. Do not wait to see if the symptoms will go away. Do not drive yourself to the hospital. Also, get help right away if: You have  thoughts of hurting yourself or others. Take one of these steps if you feel like you may hurt yourself or others, or have thoughts about taking your own life: Go to your nearest emergency room. Call 911. Call the National Suicide Prevention Lifeline at 223 846 3749 or 988. This is open 24 hours a day. Text the Crisis Text Line at 959 707 2384. This information is not intended to replace advice given to you by your health care provider. Make sure you discuss any questions you have with your health care provider. Document Revised: 08/19/2021 Document Reviewed: 08/19/2021 Elsevier Patient Education  2024 [employer].

## 2023-02-06 ENCOUNTER — Ambulatory Visit: Payer: Medicare Other | Admitting: Orthopedic Surgery

## 2023-02-06 ENCOUNTER — Encounter: Payer: Self-pay | Admitting: Orthopedic Surgery

## 2023-02-06 VITALS — Ht 61.0 in | Wt 116.0 lb

## 2023-02-06 DIAGNOSIS — S8002XD Contusion of left knee, subsequent encounter: Secondary | ICD-10-CM

## 2023-02-06 NOTE — Progress Notes (Signed)
Return Patient Visit  Assessment: Hannah Roy is a 82 y.o. female with the following: 1.  Left knee contusion.  Plan: Erskin Burnet Nazaryan fell directly on her left knee, almost a month ago.  Swelling is better.  She continues to have a lot of ecchymosis.  The pain is much better, after short course of prednisone.  Anticipate that she will continue to improve.  Urged her to continue working on range of motion.  She also sustained a concussion, which is limiting her ability to exercise nonetheless, anticipate that she will continue to get better.  Ice the knee as needed.  She is interested in a compression sleeve, and I recommended that she purchase 1 at a pharmacy.  If she has any further issues, I am happy to see her in clinic.  Follow-up: Return if symptoms worsen or fail to improve.  Subjective:  Chief Complaint  Patient presents with   Knee Injury    L knee pain after fall DOI 01/11/23    History of Present Illness: Hannah Roy is a 82 y.o. female who turns for evaluation of left knee pain.  I saw her in clinic a couple of weeks ago.  She sustained a fall, and impacted her left knee.  She also sustained a concussion.  Dr. Gerda Diss is following her for this.  She does have some periods of dizziness.  She is walking is much as she can tolerate.  Her pain, range of motion and swelling is all gone better.  She tolerated the prednisone well.  She is not using a cane to assist with ambulation.  She would like to get a compression sleeve for her left knee.  Review of Systems: No fevers or chills No numbness or tingling No chest pain No shortness of breath No bowel or bladder dysfunction No GI distress No headaches      Objective: Ht 5\' 1"  (1.549 m)   Wt 116 lb (52.6 kg)   BMI 21.92 kg/m   Physical Exam:  General: Elderly female., Alert and oriented., and No acute distress. Gait: Left sided antalgic gait.  Evaluation of the left leg demonstrates diffuse ecchymosis.  This is in the  healing phases.  Swelling over the anterior knee is improving.  She does continue to have some bruising.  This is tender to palpation.  The knee is stable to varus and valgus stress.  Negative Lachman.  She is able to achieve full extension.  She has flexion to 105 degrees.  IMAGING: No new imaging obtained today    New Medications:  No orders of the defined types were placed in this encounter.     Oliver Barre, MD  02/06/2023 9:05 AM

## 2023-02-09 ENCOUNTER — Other Ambulatory Visit (HOSPITAL_COMMUNITY): Payer: Self-pay

## 2023-02-09 MED ORDER — APIXABAN 2.5 MG PO TABS: 2.5000 mg | ORAL_TABLET | Freq: Two times a day (BID) | ORAL | Status: DC

## 2023-02-14 ENCOUNTER — Ambulatory Visit: Payer: Medicare Other | Admitting: Family Medicine

## 2023-02-14 VITALS — BP 147/91 | HR 63 | Temp 98.1°F | Ht 61.0 in | Wt 116.8 lb

## 2023-02-14 DIAGNOSIS — F0781 Postconcussional syndrome: Secondary | ICD-10-CM | POA: Diagnosis not present

## 2023-02-14 NOTE — Progress Notes (Signed)
   Subjective:    Patient ID: Hannah Roy, female    DOB: Apr 12, 1940, 82 y.o.   MRN: [id number]  Discussed the use of AI scribe software for clinical note transcription with the patient, who gave verbal consent to proceed.  History of Present Illness   The patient, with a history of a recent fall and subsequent injury, reports gradual improvement in her condition. She describes intermittent dizziness and headaches, which have been managed with nightly Tylenol. The patient expresses concern about the frequency of Tylenol use due to her existing medication regimen, but is reassured that it is safe. She describes a sensation of "walking in the air" and has slowed her pace significantly to ensure safe navigation. She also reports occasional nausea, but this is not a frequent issue.  Sleep has been inconsistent since the injury, with some nights better than others. The patient has not experienced any additional falls. She describes her headaches as heavy and also reports some back pain. She believes her ribs, which were cracked in the fall, are healing, with only slight discomfort on deep inhalation.  The patient also reports bruising that is taking a long time to heal. She has been advised not to drive until cleared by her doctor, and she expresses a desire to return to driving, primarily during the day and locally. She has concerns about driving at night due to poor visibility.  The patient is hopeful that the dizziness and headaches will resolve within the next three to four weeks. She has not reported any new symptoms or changes in her condition since the last consultation.         Review of Systems     Objective:    Physical Exam   CHEST: lungs clear to auscultation CARDIOVASCULAR: heart rhythm regular, presence of murmur noted EXTREMITIES: no edema MUSCULOSKELETAL: normal gait, normal range of motion in lower extremities NEUROLOGICAL: cranial nerves grossly intact based on visual field  testing SKIN: presence of bruising noted     Finger-to-nose normal no ataxia on gait strength in legs good movement of legs good on command      Assessment & Plan:  Assessment and Plan    Post-fall sequelae Reports of intermittent dizziness, headaches, and balance issues following a fall. No new falls reported. Gradual improvement noted. -Continue current management and monitor symptoms. -Advise patient to report if symptoms worsen.  Rib Fracture Healing rib fracture with minimal pain on deep inspiration. -Expect continued healing and resolution of pain.  Driving Safety Patient reports not driving since the fall. Neurological examination including gait, balance, and coordination is satisfactory. -Cleared for driving, primarily during the daytime and locally.  Follow-up Gradual improvement in post-fall symptoms, but still present. -Schedule follow-up visit in 4 weeks (early December 2024) to reassess.     Postconcussion syndrome dizziness headache gradually getting better should gradually go away over the next 4 weeks recheck in 4 weeks I would not recommend a repeat of the CT scan

## 2023-02-16 ENCOUNTER — Other Ambulatory Visit (HOSPITAL_COMMUNITY): Payer: Self-pay | Admitting: *Deleted

## 2023-02-16 MED ORDER — DOFETILIDE 250 MCG PO CAPS: 250.0000 ug | ORAL_CAPSULE | Freq: Two times a day (BID) | ORAL | 2 refills | Status: DC

## 2023-03-02 ENCOUNTER — Other Ambulatory Visit: Payer: Self-pay

## 2023-03-02 MED ORDER — APIXABAN 2.5 MG PO TABS: 2.5000 mg | ORAL_TABLET | Freq: Two times a day (BID) | ORAL | 0 refills | Status: DC

## 2023-03-14 ENCOUNTER — Ambulatory Visit (INDEPENDENT_AMBULATORY_CARE_PROVIDER_SITE_OTHER): Payer: Medicare Other | Admitting: Family Medicine

## 2023-03-14 ENCOUNTER — Other Ambulatory Visit: Payer: Self-pay | Admitting: Cardiology

## 2023-03-14 VITALS — BP 124/78 | HR 60 | Temp 98.4°F | Ht 61.0 in | Wt 113.8 lb

## 2023-03-14 DIAGNOSIS — F0781 Postconcussional syndrome: Secondary | ICD-10-CM

## 2023-03-14 DIAGNOSIS — T148XXA Other injury of unspecified body region, initial encounter: Secondary | ICD-10-CM

## 2023-03-14 DIAGNOSIS — M25562 Pain in left knee: Secondary | ICD-10-CM | POA: Diagnosis not present

## 2023-03-14 DIAGNOSIS — I10 Essential (primary) hypertension: Secondary | ICD-10-CM | POA: Diagnosis not present

## 2023-03-14 MED ORDER — AMLODIPINE BESYLATE 2.5 MG PO TABS: ORAL_TABLET | ORAL | 1 refills | Status: AC

## 2023-03-14 NOTE — Progress Notes (Signed)
Subjective:    Patient ID: Hannah Roy, female    DOB: [birth year], 82 y.o.   MRN: [id number]  Discussed the use of AI scribe software for clinical note transcription with the patient, who gave verbal consent to proceed.  History of Present Illness   The patient, with a history of a recent fall resulting in a concussion, presents with ongoing symptoms of lightheadedness and dizziness. These episodes are described as sudden and often lead to a temporary loss of balance, requiring the patient to sit or hold onto something. These episodes typically last for about three to five minutes before resolving. The patient also reports occasional headaches, which are managed with Tylenol.  In addition to the post-concussion symptoms, the patient has been experiencing discomfort in her left knee, which has worsened with the onset of cold weather. The knee feels stiff, particularly upon waking in the morning and during activities such as getting in and out of the car. The patient manages the knee discomfort with Tylenol and occasional cool compresses.  The patient also reports intermittent wrist pain, which is believed to be a residual effect of the fall. The pain is described as coming and going, and is located in the wrist area. The patient demonstrates full range of motion in the wrist without significant discomfort.  Lastly, the patient mentions episodes of belching after eating or drinking, which she has not experienced prior to the fall. The patient denies any difficulty swallowing or sensation of food getting stuck. The patient manages these episodes by eating slowly and avoiding certain foods.         Review of Systems     Objective:    Physical Exam   CHEST: lungs clear to auscultation CARDIOVASCULAR: normal heart sounds NEUROLOGICAL: balance maintained with eyes closed; full range of motion in upper extremities           Assessment & Plan:  Assessment and Plan    Post-Concussion  Syndrome Persistent dizziness and balance issues since injury on October 3rd. Episodes of lightheadedness and visual disturbances lasting 3-5 minutes, but self-resolving with rest. No worsening of symptoms. -Continue monitoring symptoms. If symptoms worsen or become more frequent, patient to contact office for further evaluation.  Left Knee Pain Increased stiffness and discomfort with cold weather. Pain is worse in the morning and with movement, but improves with rest. No interventions other than Tylenol as needed. -Continue Tylenol as needed for pain. Monitor symptoms and report any worsening or changes.  Hypertension Patient reports occasional spikes in blood pressure, potentially related to dietary sodium intake. Currently on Amlodipine 5mg  daily, with additional 2.5mg  as needed for high readings. -Continue Amlodipine 5mg  daily. Prescribe additional Amlodipine 2.5mg  for patient to have on hand for high readings. Encourage patient to monitor sodium intake.  Gastroesophageal Reflux Disease (GERD) Patient reports belching and sensation of food regurgitation after meals. No difficulty swallowing or food impaction. -Advise patient to monitor symptoms. If symptoms worsen or patient experiences difficulty swallowing, refer to gastroenterology for further evaluation.  Follow-up in 4 months (March 2025) or sooner if any symptoms worsen.     1. Postconcussion syndrome Does have intermittent dizziness I would not recommend repeating brain scan I do not feel that she has a subdural hematoma previous scan looked good patient has good balance today neurologically Romberg negative no ataxia walking in the hallway  2. Hematoma and contusion Contusion noted of the left knee should gradually get better  3. Essential hypertension, benign Blood pressure under  decent control   Follow-up within 4 months

## 2023-03-22 ENCOUNTER — Other Ambulatory Visit: Payer: Self-pay | Admitting: Family Medicine

## 2023-03-28 ENCOUNTER — Ambulatory Visit: Payer: Self-pay | Admitting: Family Medicine

## 2023-03-28 NOTE — Telephone Encounter (Addendum)
Copied from CRM [id number]. Topic: Clinical - Red Word Triage >> Mar 28, 2023  1:47 PM Hannah Roy wrote: Red Word that prompted transfer to Nurse Triage: increased heart rate, has afib  Agent attempted to transfer call to this RN. Call suddenly dropped. Attempted to return call to patient and there was no answer. This RN was about to make another attempt to reach patient, but upon further review of chart, this patient is currently being triaged by another RN.

## 2023-03-28 NOTE — Telephone Encounter (Signed)
Patient notified and stated she called cardiology and she has office visit scheduled tomorrow at 3 pm with Cardiology to address

## 2023-03-28 NOTE — Telephone Encounter (Signed)
Copied from CRM [id number]. Topic: Clinical - Red Word Triage >> Mar 28, 2023  1:47 PM Hannah Roy wrote: Red Word that prompted transfer to Nurse Triage: increased heart rate, has afib   Chief Complaint: tachycardia Symptoms: fatigue Frequency: ongoing intermittently since 5 pm 03/27/23 Pertinent Negatives: Patient denies dizziness Disposition: [] ED /[x] Urgent Care (no appt availability in office) / [] Appointment(In office/virtual)/ []  [facility]/ [] Home Care/ [] Refused Recommended Disposition /[] [facility]/ []  Follow-up with PCP Additional Notes: The patient reported that she has been taking Tikosyn and her afib has been well controlled but since yesterday at 5 pm her heart rate has been intermittently elevated at rest.  She is feeling very tired and a slight difference in her breathing.  She stated that her cardiac clinic is closed today. During the night her heart rate remained between 99 and 105 when laying down.  Heart rate is currently 68.  Blood pressure was 119/90 yesterday.  The patient requested to be contacted for care advice as Dr. Gerda Diss does not have availability in office today and she does not want to be seen by another provider.  She declined urgent care or the ER as she stated that Dr. Gerda Diss is familiar with her cardiac history.  She inquired if it would be possible to see him in the office today. Routed to pcp for follow up.  Reason for Disposition  [1] Heart beating very rapidly (e.g., > 140 / minute) AND [2] not present now  (Exception: During exercise.)  Answer Assessment - Initial Assessment Questions 1. DESCRIPTION: "Please describe your heart rate or heartbeat that you are having" (e.g., fast/slow, regular/irregular, skipped or extra beats, "palpitations")     fast 2. ONSET: "When did it start?" (Minutes, hours or days)      Yesterday  3. DURATION: "How long does it last" (e.g., seconds, minutes, hours)     Lasted all night when she was in bed   4. PATTERN "Does it come and go, or has it been constant since it started?"  "Does it get worse with exertion?"   "Are you feeling it now?"     Comes and goes 6. HEART RATE: "Can you tell me your heart rate?" "How many beats in 15 seconds?"  (Note: not all patients can do this)       100-105 7. RECURRENT SYMPTOM: "Have you ever had this before?" If Yes, ask: "When was the last time?" and "What happened that time?"      Afib is well controlled on Tikosyn 8. CAUSE: "What do you think is causing the palpitations?"     afib 9. CARDIAC HISTORY: "Do you have any history of heart disease?" (e.g., heart attack, angina, bypass surgery, angioplasty, arrhythmia)      afib 10. OTHER SYMPTOMS: "Do you have any other symptoms?" (e.g., dizziness, chest pain, sweating, difficulty breathing)       Feeling tired  Protocols used: Heart Rate and Heartbeat Questions-A-AH

## 2023-03-28 NOTE — Telephone Encounter (Signed)
Nurses-please connect with patient I would be willing to work her in tomorrow at 3:45 PM thank you May continue current medications

## 2023-03-29 ENCOUNTER — Ambulatory Visit (HOSPITAL_COMMUNITY)
Admission: RE | Admit: 2023-03-29 | Discharge: 2023-03-29 | Disposition: A | Discharge status: Home / Self Care | Payer: Medicare Other | Source: Ambulatory Visit | Attending: Internal Medicine | Admitting: Internal Medicine

## 2023-03-29 VITALS — BP 122/82 | HR 71 | Ht 61.0 in | Wt 113.6 lb

## 2023-03-29 DIAGNOSIS — I4892 Unspecified atrial flutter: Secondary | ICD-10-CM | POA: Diagnosis not present

## 2023-03-29 DIAGNOSIS — I480 Paroxysmal atrial fibrillation: Secondary | ICD-10-CM | POA: Insufficient documentation

## 2023-03-29 DIAGNOSIS — I2510 Atherosclerotic heart disease of native coronary artery without angina pectoris: Secondary | ICD-10-CM | POA: Diagnosis not present

## 2023-03-29 DIAGNOSIS — E039 Hypothyroidism, unspecified: Secondary | ICD-10-CM | POA: Diagnosis not present

## 2023-03-29 DIAGNOSIS — I4891 Unspecified atrial fibrillation: Secondary | ICD-10-CM | POA: Diagnosis not present

## 2023-03-29 DIAGNOSIS — I4510 Unspecified right bundle-branch block: Secondary | ICD-10-CM | POA: Insufficient documentation

## 2023-03-29 DIAGNOSIS — I071 Rheumatic tricuspid insufficiency: Secondary | ICD-10-CM | POA: Diagnosis not present

## 2023-03-29 DIAGNOSIS — Z7901 Long term (current) use of anticoagulants: Secondary | ICD-10-CM | POA: Insufficient documentation

## 2023-03-29 DIAGNOSIS — Z79899 Other long term (current) drug therapy: Secondary | ICD-10-CM | POA: Diagnosis not present

## 2023-03-29 DIAGNOSIS — I10 Essential (primary) hypertension: Secondary | ICD-10-CM | POA: Diagnosis not present

## 2023-03-29 DIAGNOSIS — Z5181 Encounter for therapeutic drug level monitoring: Secondary | ICD-10-CM

## 2023-03-29 DIAGNOSIS — Z952 Presence of prosthetic heart valve: Secondary | ICD-10-CM | POA: Insufficient documentation

## 2023-03-29 DIAGNOSIS — Z953 Presence of xenogenic heart valve: Secondary | ICD-10-CM | POA: Insufficient documentation

## 2023-03-29 DIAGNOSIS — I4819 Other persistent atrial fibrillation: Secondary | ICD-10-CM | POA: Diagnosis not present

## 2023-03-29 DIAGNOSIS — D6869 Other thrombophilia: Secondary | ICD-10-CM | POA: Insufficient documentation

## 2023-03-29 DIAGNOSIS — Z87891 Personal history of nicotine dependence: Secondary | ICD-10-CM | POA: Diagnosis not present

## 2023-03-29 LAB — MAGNESIUM: Magnesium: 2 mg/dL (ref 1.7–2.4)

## 2023-03-29 LAB — BASIC METABOLIC PANEL
Anion gap: 15 (ref 5–15)
BUN: 17 mg/dL (ref 8–23)
CO2: 19 mmol/L — ABNORMAL LOW (ref 22–32)
Calcium: 9.2 mg/dL (ref 8.9–10.3)
Chloride: 104 mmol/L (ref 98–111)
Creatinine, Ser: 0.8 mg/dL (ref 0.44–1.00)
GFR, Estimated: >60 mL/min (ref >60)
Glucose, Bld: 98 mg/dL (ref 70–99)
Potassium: 4.6 mmol/L (ref 3.5–5.1)
Sodium: 138 mmol/L (ref 135–145)
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
## 2023-03-30 NOTE — Addendum Note (Signed)
Encounter addended by: Eustace Pen, PA-C on: 03/30/2023 8:57 AM  Actions taken: Clinical Note Signed

## 2023-05-01 ENCOUNTER — Encounter: Payer: Self-pay | Admitting: Cardiology

## 2023-05-01 ENCOUNTER — Ambulatory Visit: Payer: Medicare Other | Attending: Cardiology | Admitting: Cardiology

## 2023-05-01 VITALS — BP 120/68 | HR 66 | Ht 61.0 in | Wt 112.8 lb

## 2023-05-01 DIAGNOSIS — I480 Paroxysmal atrial fibrillation: Secondary | ICD-10-CM

## 2023-05-01 DIAGNOSIS — I38 Endocarditis, valve unspecified: Secondary | ICD-10-CM

## 2023-05-01 DIAGNOSIS — I2510 Atherosclerotic heart disease of native coronary artery without angina pectoris: Secondary | ICD-10-CM

## 2023-05-01 MED ORDER — APIXABAN 5 MG PO TABS: 5.0000 mg | ORAL_TABLET | Freq: Two times a day (BID) | ORAL | 0 refills | Status: DC

## 2023-05-01 MED ORDER — APIXABAN 2.5 MG PO TABS: 2.5000 mg | ORAL_TABLET | Freq: Two times a day (BID) | ORAL | 0 refills | Status: DC

## 2023-05-01 NOTE — Patient Instructions (Signed)
Medication Instructions:   Your physician recommends that you continue on your current medications as directed. Please refer to the Current Medication list given to you today.  Take Pepcid (famotidine) over the counter for reflux  Labwork: None today  Testing/Procedures: Your physician has requested that you have an echocardiogram in [place]. Echocardiography is a painless test that uses sound waves to create images of your heart. It provides your doctor with information about the size and shape of your heart and how well your heart's chambers and valves are working. This procedure takes approximately one hour. There are no restrictions for this procedure. Please do NOT wear cologne, perfume, aftershave, or lotions (deodorant is allowed). Please arrive 15 minutes prior to your appointment time.  Please note: We ask at that you not bring children with you during ultrasound (echo/ vascular) testing. Due to room size and safety concerns, children are not allowed in the ultrasound rooms during exams. Our front office staff cannot provide observation of children in our lobby area while testing is being conducted. An adult accompanying a patient to their appointment will only be allowed in the ultrasound room at the discretion of the ultrasound technician under special circumstances. We apologize for any inconvenience.   Follow-Up: In July after echo, with Dr.McDowell  Any Other Special Instructions Will Be Listed Below (If Applicable).  If you need a refill on your cardiac medications before your next appointment, please call your pharmacy.

## 2023-05-01 NOTE — Progress Notes (Signed)
Cardiology Office Note  Date: 05/01/2023   ID: Colandra, Hitchcock [birth year], MRN [id number]  History of Present Illness: Hannah Roy is an 83 y.o. female last seen in July 2024.  She has had interval follow-up in the atrial fibrillation clinic as well.  She did have an episode of atrial fibrillation in December 2024, lasted for about a day and a half but resolved spontaneously prior to her follow-up in the atrial fibrillation clinic.  I reviewed her medications.  Current regimen includes Eliquis, Norvasc, Tikosyn, Lopressor, Crestor, potassium supplement, and as needed nitroglycerin.  She states that she has been compliant with her medications.  I did review interval lab work from December 2024 as noted below.  She does state that she feels somewhat dizzy at times, sometimes in the morning when she gets up, no associated palpitations and no frank syncope.  Physical Exam: VS:  BP 120/68   Pulse 66   Ht 5\' 1"  (1.549 m)   Wt 112 lb 12.8 oz (51.2 kg)   SpO2 98%   BMI 21.31 kg/m , BMI Body mass index is 21.31 kg/m.  Wt Readings from Last 3 Encounters:  05/01/23 112 lb 12.8 oz (51.2 kg)  03/29/23 113 lb 9.6 oz (51.5 kg)  03/14/23 113 lb 12.8 oz (51.6 kg)    General: Patient appears comfortable at rest. HEENT: Conjunctiva and lids normal. Neck: Supple, no elevated JVP or carotid bruits. Lungs: Clear to auscultation, nonlabored breathing at rest. Cardiac: Regular rate and rhythm, no S3, 3/6 systolic murmur, no pericardial rub. Extremities: No pitting edema.  ECG:  An ECG dated 03/29/2023 was personally reviewed today and demonstrated:  Sinus rhythm with right bundle branch block.  Labwork: 12/28/2022: ALT 12; AST 16 01/11/2023: Hemoglobin 12.4; Platelets 157 03/29/2023: BUN 17; Creatinine, Ser 0.80; Magnesium 2.0; Potassium 4.6; Sodium 138     Component Value Date/Time   CHOL 149 12/28/2022 1006   TRIG 86 12/28/2022 1006   HDL 60 12/28/2022 1006   CHOLHDL 2.5 12/28/2022 1006    CHOLHDL 2.8 05/16/2021 1036   VLDL 15 05/16/2021 1036   LDLCALC 73 12/28/2022 1006   Other Studies Reviewed Today:  No interval cardiac testing for review today.  Assessment and Plan:  1.  History of ascending aortic aneurysm with associated valvular heart disease status post 28 mm Hemashield platinum supracoronary graft, 21 mm Edwards Magna Ease pericardial AVR, Maze procedure with left atrial appendage clipping, 29 mm Edwards Magna Mitral pericardial MVR, and 28 mm Edwards MC3 tricuspid annuloplasty in May 2020.  Follow-up echocardiogram in November 2023 revealed no significant mitral regurgitation and mild tricuspid regurgitation.  Plan to continue observation for now, will repeat echocardiogram before next visit in 6 months.   2.  Paroxysmal atrial fibrillation with CHA2DS2-VASc score of 5.  He did have a breakthrough episode in December 2024 as discussed above, spontaneously resolved on current medical therapy.  No change in regimen indicated at this time.  She will continue Tikosyn, Lopressor, and Eliquis as before.   3.  Mild CAD by cardiac catheterization in October 2022.  LVEF 60 to 65% by echocardiogram in November 2023.  No angina.  She is not on aspirin given use of Eliquis.  Continue Crestor.   4.  Primary hypertension.  Blood pressure well-controlled today.  I did ask her to check blood pressure periodically at home to make sure that she is not having any symptomatic hypotension.  He is also on Norvasc.  Disposition:  Follow up  6 months.  Signed, Jonelle Sidle, M.D., F.A.C.C. [facility]

## 2023-05-03 ENCOUNTER — Other Ambulatory Visit: Payer: Self-pay | Admitting: Family Medicine

## 2023-06-01 ENCOUNTER — Other Ambulatory Visit (HOSPITAL_COMMUNITY): Payer: Self-pay

## 2023-06-01 NOTE — Progress Notes (Signed)
Patient given samples of Eliquis 2.5 mg tablets on 03/29/2023. [employer] Lot # [id number] Exp: 05/10/2024 Manufacturer: Pfizer Quantity: 56 tablets

## 2023-06-06 ENCOUNTER — Other Ambulatory Visit: Payer: Self-pay

## 2023-06-06 MED ORDER — APIXABAN 2.5 MG PO TABS: 2.5000 mg | ORAL_TABLET | Freq: Two times a day (BID) | ORAL | 0 refills | Status: DC

## 2023-06-07 ENCOUNTER — Ambulatory Visit (HOSPITAL_COMMUNITY): Payer: Medicare Other | Admitting: Physician Assistant

## 2023-06-14 ENCOUNTER — Other Ambulatory Visit (HOSPITAL_COMMUNITY): Payer: Self-pay | Admitting: Physician Assistant

## 2023-06-20 ENCOUNTER — Other Ambulatory Visit (HOSPITAL_COMMUNITY): Payer: Self-pay | Admitting: *Deleted

## 2023-06-20 MED ORDER — DOFETILIDE 250 MCG PO CAPS: 250.0000 ug | ORAL_CAPSULE | Freq: Two times a day (BID) | ORAL | 2 refills | Status: DC

## 2023-06-27 ENCOUNTER — Ambulatory Visit (INDEPENDENT_AMBULATORY_CARE_PROVIDER_SITE_OTHER): Payer: Medicare Other | Admitting: Family Medicine

## 2023-06-27 ENCOUNTER — Encounter: Payer: Self-pay | Admitting: Family Medicine

## 2023-06-27 VITALS — BP 123/83 | HR 62 | Temp 98.1°F | Ht 61.0 in | Wt 110.0 lb

## 2023-06-27 DIAGNOSIS — E7849 Other hyperlipidemia: Secondary | ICD-10-CM | POA: Diagnosis not present

## 2023-06-27 DIAGNOSIS — I10 Essential (primary) hypertension: Secondary | ICD-10-CM | POA: Diagnosis not present

## 2023-06-27 DIAGNOSIS — F0781 Postconcussional syndrome: Secondary | ICD-10-CM | POA: Diagnosis not present

## 2023-06-27 DIAGNOSIS — K219 Gastro-esophageal reflux disease without esophagitis: Secondary | ICD-10-CM

## 2023-06-27 DIAGNOSIS — K047 Periapical abscess without sinus: Secondary | ICD-10-CM

## 2023-06-27 MED ORDER — AMOXICILLIN 500 MG PO CAPS: 500.0000 mg | ORAL_CAPSULE | Freq: Three times a day (TID) | ORAL | 0 refills | Status: AC

## 2023-06-27 NOTE — Progress Notes (Signed)
 Subjective:    Patient ID: Hannah Roy, female    DOB: 01-04-1941, 83 y.o.   MRN: [id number]  HPI Presents today for a follow-up regarding her post-concussive syndrome, GERD, and hypertension. She is still experiencing headaches a couple of times per month. Takes tylenol and gets relief from that. Feels lightheaded at times, but it trying to be more cautious so that she does not have another fall. Taking blood pressure at home, and reports that she has been taking extra 2.5 of amlodipine when blood pressure gets over 140/90. Blood pressure has been improving, and reports no side effects. GERD symptoms has improved but are still persisting. Reports that while she is eating, food sometimes gets "stuck", and she feels the need to burp. Burping and drinking water after swallowing food helps this. Sometimes she regurgitates food up. Has also been taking the Pepcid and reports that it helps some. No caffeine or NSAID use. Tries to eat smaller, more frequent meals. Would like a referral to GI. Up to date on colonoscopy.  Also endorses tooth pain on her bottom right tooth. Reports that it started to become red along her gum line, and bothers her.    Review of Systems  HENT:  Positive for trouble swallowing. Negative for sore throat.   Eyes:  Negative for visual disturbance.  Respiratory:  Negative for chest tightness, shortness of breath and wheezing.   Gastrointestinal:  Negative for abdominal distention.  Neurological:  Positive for light-headedness and headaches. Negative for dizziness and weakness.      Objective:   Physical Exam Vitals and nursing note reviewed.  Constitutional:      General: She is not in acute distress.    Appearance: Normal appearance. She is not ill-appearing.  Cardiovascular:     Rate and Rhythm: Normal rate and regular rhythm.     Heart sounds: Normal heart sounds, S1 normal and S2 normal. No murmur heard. Pulmonary:     Effort: Pulmonary effort is normal. No  respiratory distress.     Breath sounds: Normal breath sounds. No wheezing.  Abdominal:     General: There is no distension.     Palpations: Abdomen is soft.     Tenderness: There is abdominal tenderness in the epigastric area.     Comments: No obvious masses or abnormalities noted.   Neurological:     Mental Status: She is alert.    Vitals:   06/27/23 1035  BP: 123/83  Pulse: 62  Temp: 98.1 F (36.7 C)  Height: 5\' 1"  (1.549 m)  Weight: 110 lb (49.9 kg)  SpO2: 97%  BMI (Calculated): 20.8       Assessment & Plan:  1. Postconcussion syndrome (Primary) -Advised patient that symptoms may take time to go away, and as long as the Tylenol helps for headaches, then it is okay. Recommended patient to take more caution to prevent falls.   2. Other hyperlipidemia  - Lipid panel  3. Gastroesophageal reflux disease without esophagitis  - Ambulatory referral to Gastroenterology -She is doing the correct things as far as diet, and avoiding caffeine and NSAID use. Has been evaluated by cardiology, and they do not think this pain is heart-related.   4. Essential hypertension, benign  - Basic metabolic panel -Advised to continue monitoring blood pressure at home, and if current medication is not working to notify the office.   5. Dental infection  - amoxicillin (AMOXIL) 500 MG capsule; Take 1 capsule (500 mg total) by mouth 3 (three)  times daily for 10 days.  Dispense: 21 capsule; Refill: 0  -Advised that if infection does not clear up, then to let her dentist know.   Return late aug or early sept with me, for HTN.

## 2023-07-04 ENCOUNTER — Encounter (INDEPENDENT_AMBULATORY_CARE_PROVIDER_SITE_OTHER): Payer: Self-pay | Admitting: *Deleted

## 2023-07-18 ENCOUNTER — Other Ambulatory Visit: Payer: Self-pay | Admitting: Family Medicine

## 2023-07-18 ENCOUNTER — Other Ambulatory Visit: Payer: Self-pay | Admitting: Cardiology

## 2023-07-30 ENCOUNTER — Telehealth (INDEPENDENT_AMBULATORY_CARE_PROVIDER_SITE_OTHER): Payer: Self-pay | Admitting: Gastroenterology

## 2023-07-30 ENCOUNTER — Encounter (INDEPENDENT_AMBULATORY_CARE_PROVIDER_SITE_OTHER): Payer: Self-pay | Admitting: Gastroenterology

## 2023-07-30 ENCOUNTER — Ambulatory Visit (INDEPENDENT_AMBULATORY_CARE_PROVIDER_SITE_OTHER): Admitting: Gastroenterology

## 2023-07-30 VITALS — BP 124/76 | HR 62 | Temp 97.5°F | Ht 62.0 in | Wt 112.1 lb

## 2023-07-30 DIAGNOSIS — R142 Eructation: Secondary | ICD-10-CM | POA: Insufficient documentation

## 2023-07-30 DIAGNOSIS — R109 Unspecified abdominal pain: Secondary | ICD-10-CM

## 2023-07-30 DIAGNOSIS — R1013 Epigastric pain: Secondary | ICD-10-CM | POA: Insufficient documentation

## 2023-07-30 MED ORDER — PANTOPRAZOLE SODIUM 40 MG PO TBEC: 40.0000 mg | DELAYED_RELEASE_TABLET | Freq: Two times a day (BID) | ORAL | 1 refills | Status: DC
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
## 2023-07-30 NOTE — Telephone Encounter (Signed)
    07/30/23  Hannah Roy Jan 12, 1941  What type of surgery is being performed? EGD  When is surgery scheduled? 09/05/23  What type of clearance is required (medical or pharmacy to hold medication or both? Pharmacy to hold med  Are there any medications that need to be held prior to surgery and how long? Clearance to hold Eliquis  for 2 days prior  Name of physician performing surgery?  Dr. Samantha Cress [facility] Gastroenterology at [facility] Phone: [phone] Fax: [phone]  Anethesia type (none, local, MAC, general)? Choice

## 2023-07-30 NOTE — Patient Instructions (Addendum)
 Schedule EGD Increase pantoprazole  to 40 mg twice a day

## 2023-07-31 ENCOUNTER — Encounter (INDEPENDENT_AMBULATORY_CARE_PROVIDER_SITE_OTHER): Payer: Self-pay

## 2023-08-01 NOTE — Telephone Encounter (Signed)
 Patient with diagnosis of atrial fibrillation on Eliquis  for anticoagulation.    What type of surgery is being performed? EGD   When is surgery scheduled? 09/05/23   CHA2DS2-VASc Score = 5   This indicates a 7.2% annual risk of stroke. The patient's score is based upon: CHF History: 0 HTN History: 1 Diabetes History: 0 Stroke History: 0 Vascular Disease History: 1 Age Score: 2 Gender Score: 1    CrCl 45 Platelet count 157  Patient has not  had an Afib/aflutter ablation within the last 3 months or DCCV within the last 30 days  Per office protocol, patient can hold Eliquis  for 2 days prior to procedure.   Patient will not need bridging with Lovenox (enoxaparin) around procedure.  **This guidance is not considered finalized until pre-operative APP has relayed final recommendations.**

## 2023-08-01 NOTE — Telephone Encounter (Signed)
   Patient Name: Hannah Roy  DOB: 1940/09/09 MRN: [id number]  Primary Cardiologist: Teddie Favre, MD  Clinical pharmacists have reviewed the patient's past medical history, labs, and current medications as part of preoperative protocol coverage. The following recommendations have been made:  Patient with diagnosis of atrial fibrillation on Eliquis  for anticoagulation.     What type of surgery is being performed? EGD   When is surgery scheduled? 09/05/23    CHA2DS2-VASc Score = 5  This indicates a 7.2% annual risk of stroke. The patient's score is based upon: CHF History: 0 HTN History: 1 Diabetes History: 0 Stroke History: 0 Vascular Disease History: 1 Age Score: 2 Gender Score: 1     CrCl 45 Platelet count 157   Patient has not  had an Afib/aflutter ablation within the last 3 months or DCCV within the last 30 days   Per office protocol, patient can hold Eliquis  for 2 days prior to procedure.   Patient will not need bridging with Lovenox (enoxaparin) around procedure. Please resume Eliquis  as soon as possible postprocedure, at the discretion of the surgeon.   I will route this recommendation to the requesting party via Epic fax function and remove from pre-op pool.  Please call with questions.  Jude Norton, NP 08/01/2023, 2:04 PM

## 2023-08-22 ENCOUNTER — Telehealth (HOSPITAL_COMMUNITY): Payer: Self-pay | Admitting: *Deleted

## 2023-08-22 ENCOUNTER — Encounter: Payer: Self-pay | Admitting: Physician Assistant

## 2023-08-22 ENCOUNTER — Ambulatory Visit (INDEPENDENT_AMBULATORY_CARE_PROVIDER_SITE_OTHER): Admitting: Physician Assistant

## 2023-08-22 VITALS — BP 108/76 | HR 102 | Temp 98.1°F | Ht 62.0 in | Wt 107.8 lb

## 2023-08-22 DIAGNOSIS — I480 Paroxysmal atrial fibrillation: Secondary | ICD-10-CM

## 2023-08-22 LAB — EKG 12-LEAD

## 2023-08-22 NOTE — Telephone Encounter (Signed)
 Received call from primary care physician regarding patients heart rates in the low 100s since Monday. Patient overall feels normal but has noticed her heart rates being elevated. She wonders if it is related to severe reflux she is experiencing which is being worked up by GI with pending EGD. Patient BP 108/76 HR 85-104. Discussed with Caesar Caster PA will temporarily increase metoprolol  to 37.5mg  BID and hold amlodipine  until returns to normal rhythm.  Pt will call if issues arise before appt that is scheduled for mid-June. Pt in agreement with plan and verbalized understanding.

## 2023-08-22 NOTE — Assessment & Plan Note (Addendum)
 Patient presents today with history of A-find and elevated heart rate. Patient is tachycardic on exam with pulse of 104. Blood pressure 108/76 today. Normal heart sounds, irregular rhythm. Lungs clear to auscultation bilaterally. EKG done in office revealed A-fib and right bundle branch block, consistent with previous EKG from 03/2023. I reached out to patient's A-fib clinic provider. They will call/see her today. No further intervention as this time. Warning signs and return precautions discussed. Patient voices understanding.

## 2023-08-22 NOTE — Progress Notes (Signed)
   Established Patient Office Visit  Subjective   Patient ID: Hannah Roy, female    DOB: 29-Oct-1940  Age: 83 y.o. MRN: [id number]  No chief complaint on file.   Patient presents today with concerns of increased heart rate. She has a known history of A fib and hypertension, currently controlled with amlodipine , Tikosyn , and metoprolol . She reports symptoms began Monday night. She endorses elevated blood pressure at home with associated headache, but symptoms have since resolved. However, heart rate remains in the 95-105 range. She denies significant shortness of breath or chest pain but states she is aware of her heart beat. She relates heart rate is elevated even when laying in bed. She denies missing any medication.      Review of Systems  Constitutional:  Negative for fever, malaise/fatigue and weight loss.  Respiratory:  Negative for cough and shortness of breath.   Cardiovascular:  Positive for palpitations. Negative for chest pain, orthopnea and leg swelling.  Neurological:  Negative for dizziness, loss of consciousness and weakness.      Objective:      BP 108/76   Pulse (!) 102   Temp 98.1 F (36.7 C)   Ht 5\' 2"  (1.575 m)   Wt 107 lb 12.8 oz (48.9 kg)   SpO2 99%   BMI 19.72 kg/m    Physical Exam Constitutional:      Appearance: Normal appearance.  HENT:     Head: Normocephalic.     Mouth/Throat:     Mouth: Mucous membranes are moist.     Pharynx: Oropharynx is clear.  Eyes:     Extraocular Movements: Extraocular movements intact.     Conjunctiva/sclera: Conjunctivae normal.  Cardiovascular:     Rate and Rhythm: Tachycardia present. Rhythm irregular.     Heart sounds: No murmur heard. Pulmonary:     Effort: Pulmonary effort is normal.     Breath sounds: Normal breath sounds. No wheezing.  Musculoskeletal:     Right lower leg: No edema.     Left lower leg: No edema.  Skin:    General: Skin is warm and dry.  Neurological:     General: No focal deficit  present.     Mental Status: She is alert and oriented to person, place, and time.  Psychiatric:        Mood and Affect: Mood normal.        Behavior: Behavior normal.      The ASCVD Risk score (Arnett DK, et al., 2019) failed to calculate for the following reasons:   The 2019 ASCVD risk score is only valid for ages 22 to 63   Risk score cannot be calculated because patient has a medical history suggesting prior/existing ASCVD    Assessment & Plan:   Return if symptoms worsen or fail to improve.   Paroxysmal A-fib [facility]) Assessment & Plan: Patient presents today with history of A-find and elevated heart rate. Patient is tachycardic on exam with pulse of 104. Blood pressure 108/76 today. Normal heart sounds, irregular rhythm. Lungs clear to auscultation bilaterally. EKG done in office revealed A-fib and right bundle branch block, consistent with previous EKG from 03/2023. I reached out to patient's A-fib clinic provider. They will call/see her today. No further intervention as this time. Warning signs and return precautions discussed. Patient voices understanding.   Orders: -     EKG 12-Lead    [employer], PA-C

## 2023-08-23 ENCOUNTER — Other Ambulatory Visit: Payer: Self-pay | Admitting: Cardiology

## 2023-08-24 ENCOUNTER — Telehealth (HOSPITAL_COMMUNITY): Payer: Self-pay

## 2023-08-24 NOTE — Telephone Encounter (Signed)
 Patient called stating that since she increased her BB that her BP has went low also she stated that she was back in rhythm. Patient advised to continue to hold Amlodipine  and to decrease BB back to 25mg  twice daily.

## 2023-08-31 ENCOUNTER — Ambulatory Visit (HOSPITAL_COMMUNITY)
Admission: RE | Admit: 2023-08-31 | Discharge: 2023-08-31 | Disposition: A | Discharge status: Home / Self Care | Source: Ambulatory Visit | Attending: Physician Assistant | Admitting: Physician Assistant

## 2023-08-31 ENCOUNTER — Encounter (HOSPITAL_COMMUNITY): Payer: Self-pay | Admitting: Physician Assistant

## 2023-08-31 VITALS — BP 112/80 | HR 64 | Ht 62.0 in | Wt 109.2 lb

## 2023-08-31 DIAGNOSIS — Z79899 Other long term (current) drug therapy: Secondary | ICD-10-CM | POA: Diagnosis not present

## 2023-08-31 DIAGNOSIS — I480 Paroxysmal atrial fibrillation: Secondary | ICD-10-CM | POA: Diagnosis not present

## 2023-08-31 DIAGNOSIS — D6869 Other thrombophilia: Secondary | ICD-10-CM | POA: Diagnosis not present

## 2023-08-31 DIAGNOSIS — Z5181 Encounter for therapeutic drug level monitoring: Secondary | ICD-10-CM | POA: Diagnosis not present
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
## 2023-09-01 LAB — MAGNESIUM: Magnesium: 2.1 mg/dL (ref 1.6–2.3)

## 2023-09-01 LAB — BASIC METABOLIC PANEL WITH GFR
BUN/Creatinine Ratio: 13 (ref 12–28)
BUN: 11 mg/dL (ref 8–27)
CO2: 21 mmol/L (ref 20–29)
Calcium: 9.5 mg/dL (ref 8.7–10.3)
Chloride: 104 mmol/L (ref 96–106)
Creatinine, Ser: 0.82 mg/dL (ref 0.57–1.00)
Glucose: 89 mg/dL (ref 70–99)
Potassium: 4 mmol/L (ref 3.5–5.2)
Sodium: 141 mmol/L (ref 134–144)
eGFR: 72 mL/min/{1.73_m2} (ref >59)

## 2023-09-04 ENCOUNTER — Other Ambulatory Visit (HOSPITAL_COMMUNITY)

## 2023-09-04 ENCOUNTER — Telehealth (INDEPENDENT_AMBULATORY_CARE_PROVIDER_SITE_OTHER): Payer: Self-pay | Admitting: Gastroenterology

## 2023-09-04 ENCOUNTER — Encounter (HOSPITAL_COMMUNITY): Admission: RE | Admit: 2023-09-04 | Source: Ambulatory Visit

## 2023-09-04 NOTE — Telephone Encounter (Signed)
 Pt daughter Edwina Gram left voicemail stating that pt has went into Afib and that provider at Afib clinic advised them to cancel EGD and reschedule 4 weeks out. Returned call to pt daughter but had to leave message. Will we need to see patient back in clinic or go ahead and reschedule for 4 weeks out? Please advise. Thank you.

## 2023-09-04 NOTE — Telephone Encounter (Signed)
 Will pt need to be seen in office or can we go ahead and reschedule 4 weeks out, also does she need to have cardiac clearance?

## 2023-09-04 NOTE — Telephone Encounter (Signed)
 Patient's daughter, Edwina Gram, left message over the weekend that patient needed to cancel her procedure due to Afib and her doctor said to reschedule about 4 weeks out. Edwina Gram can be reached at [phone]

## 2023-09-04 NOTE — Telephone Encounter (Signed)
 Note sent to PCP earlier regarding patient.

## 2023-09-05 ENCOUNTER — Ambulatory Visit (HOSPITAL_COMMUNITY): Admission: RE | Admit: 2023-09-05 | Source: Home / Self Care | Admitting: Gastroenterology

## 2023-09-05 ENCOUNTER — Encounter (HOSPITAL_COMMUNITY): Admission: RE | Payer: Self-pay | Source: Home / Self Care

## 2023-09-05 SURGERY — EGD (ESOPHAGOGASTRODUODENOSCOPY)
Anesthesia: Choice

## 2023-09-06 NOTE — Telephone Encounter (Signed)
 Pt has been rescheduled for 10/02/23 by T. Ouida Bloom, LPN

## 2023-09-06 NOTE — Telephone Encounter (Signed)
 No need to see her in office but she will need to be rescheduled 4 weeks out. Needs clearance to hold eliquis .  thanks

## 2023-09-06 NOTE — Telephone Encounter (Signed)
 Pt daughter contacted and rescheduled to 10/02/23. Clearance received on 07/30/23. Updated instructions will be mailed once pre op is received.

## 2023-09-06 NOTE — Telephone Encounter (Signed)
 No need to be seen in office, will need cardiology clearance to hold Eliquis , please reschedule in 4 weeks Thanks

## 2023-09-06 NOTE — Telephone Encounter (Signed)
 Pt has called several times as well as her daughter, Edwina Gram. I left VM for Edwina Gram that we are aware and waiting to see what Dr Elton Ham recommendations are and we would be in touch.

## 2023-09-13 ENCOUNTER — Ambulatory Visit (HOSPITAL_COMMUNITY): Payer: Self-pay | Admitting: Physician Assistant

## 2023-09-15 ENCOUNTER — Other Ambulatory Visit: Payer: Self-pay | Admitting: Family Medicine

## 2023-09-20 ENCOUNTER — Ambulatory Visit (HOSPITAL_COMMUNITY): Payer: Medicare Other | Admitting: Physician Assistant

## 2023-09-27 ENCOUNTER — Other Ambulatory Visit: Payer: Self-pay | Admitting: Cardiology

## 2023-09-28 ENCOUNTER — Other Ambulatory Visit (HOSPITAL_COMMUNITY)

## 2023-09-28 ENCOUNTER — Encounter (HOSPITAL_COMMUNITY)
Admission: RE | Admit: 2023-09-28 | Discharge: 2023-09-28 | Disposition: A | Discharge status: Home / Self Care | Source: Ambulatory Visit | Attending: Gastroenterology | Admitting: Gastroenterology

## 2023-09-28 ENCOUNTER — Encounter (HOSPITAL_COMMUNITY): Payer: Self-pay

## 2023-09-28 NOTE — Patient Instructions (Signed)
 Hannah Roy  09/28/2023     @PREFPERIOPPHARMACY @   Your procedure is scheduled on 10/02/2023.   Report to [facility] at  0700 A.M.   Call this number if you have problems the morning of surgery:  [phone]  If you experience any cold or flu symptoms such as cough, fever, chills, shortness of breath, etc. between now and your scheduled surgery, please notify us  at the above number.   Remember:         Your last dose of eliquis  should be 09-29-2023.   Follow the diet instructions given to you by the office.   You may drink clear liquids until  0500 am on 10/02/2023.    Clear liquids allowed are:                    Water , Juice (No red color; non-citric and without pulp; diabetics please choose diet or no sugar options), Carbonated beverages (diabetics please choose diet or no sugar options), Clear Tea (No creamer, milk, or cream, including half & half and powdered creamer), Black Coffee Only (No creamer, milk or cream, including half & half and powdered creamer), and Clear Sports drink (No red color; diabetics please choose diet or no sugar options)    Take these medicines the morning of surgery with A SIP OF WATER                    amlodipine , tikosyn , metoprolol , pantoprazole .    Do not wear jewelry, make-up or nail polish, including gel polish,  artificial nails, or any other type of covering on natural nails (fingers and  toes).  Do not wear lotions, powders, or perfumes, or deodorant.  Do not shave 48 hours prior to surgery.  Men may shave face and neck.  Do not bring valuables to the hospital.  [facility] is not responsible for any belongings or valuables.  Contacts, dentures or bridgework may not be worn into surgery.  Leave your suitcase in the car.  After surgery it may be brought to your room.  For patients admitted to the hospital, discharge time will be determined by your treatment team.  Patients discharged the day of surgery will not be allowed to  drive home and must have someone with them for 24 hours.    Special instructions:   DO NOT smoke tobacco or vape for 24 hours before your procedure.  Please read over the following fact sheets that you were given. Anesthesia Post-op Instructions and Care and Recovery After Surgery      Upper Endoscopy, Adult, Care After After the procedure, it is common to have a sore throat. It is also common to have: Mild stomach pain or discomfort. Bloating. Nausea. Follow these instructions at home: The instructions below may help you care for yourself at home. Your health care provider may give you more instructions. If you have questions, ask your health care provider. If you were given a sedative during the procedure, it can affect you for several hours. Do not drive or operate machinery until your health care provider says that it is safe. If you will be going home right after the procedure, plan to have a responsible adult: Take you home from the hospital or clinic. You will not be allowed to drive. Care for you for the time you are told. Follow instructions from your health care provider about what you may eat and drink. Return to your normal activities  as told by your health care provider. Ask your health care provider what activities are safe for you. Take over-the-counter and prescription medicines only as told by your health care provider. Contact a health care provider if you: Have a sore throat that lasts longer than one day. Have trouble swallowing. Have a fever. Get help right away if you: Vomit blood or your vomit looks like coffee grounds. Have bloody, black, or tarry stools. Have a very bad sore throat or you cannot swallow. Have difficulty breathing or very bad pain in your chest or abdomen. These symptoms may be an emergency. Get help right away. Call 911. Do not wait to see if the symptoms will go away. Do not drive yourself to the hospital. Summary After the procedure, it  is common to have a sore throat, mild stomach discomfort, bloating, and nausea. If you were given a sedative during the procedure, it can affect you for several hours. Do not drive until your health care provider says that it is safe. Follow instructions from your health care provider about what you may eat and drink. Return to your normal activities as told by your health care provider. This information is not intended to replace advice given to you by your health care provider. Make sure you discuss any questions you have with your health care provider. Document Revised: 07/06/2021 Document Reviewed: 07/06/2021 Elsevier Patient Education  2024 [employer].General Anesthesia, Adult, Care After The following information offers guidance on how to care for yourself after your procedure. Your health care provider may also give you more specific instructions. If you have problems or questions, contact your health care provider. What can I expect after the procedure? After the procedure, it is common for people to: Have pain or discomfort at the IV site. Have nausea or vomiting. Have a sore throat or hoarseness. Have trouble concentrating. Feel cold or chills. Feel weak, sleepy, or tired (fatigue). Have soreness and body aches. These can affect parts of the body that were not involved in surgery. Follow these instructions at home: For the time period you were told by your health care provider:  Rest. Do not participate in activities where you could fall or become injured. Do not drive or use machinery. Do not drink alcohol. Do not take sleeping pills or medicines that cause drowsiness. Do not make important decisions or sign legal documents. Do not take care of children on your own. General instructions Drink enough fluid to keep your urine pale yellow. If you have sleep apnea, surgery and certain medicines can increase your risk for breathing problems. Follow instructions from your health  care provider about wearing your sleep device: Anytime you are sleeping, including during daytime naps. While taking prescription pain medicines, sleeping medicines, or medicines that make you drowsy. Return to your normal activities as told by your health care provider. Ask your health care provider what activities are safe for you. Take over-the-counter and prescription medicines only as told by your health care provider. Do not use any products that contain nicotine or tobacco. These products include cigarettes, chewing tobacco, and vaping devices, such as e-cigarettes. These can delay incision healing after surgery. If you need help quitting, ask your health care provider. Contact a health care provider if: You have nausea or vomiting that does not get better with medicine. You vomit every time you eat or drink. You have pain that does not get better with medicine. You cannot urinate or have bloody urine. You develop a skin  rash. You have a fever. Get help right away if: You have trouble breathing. You have chest pain. You vomit blood. These symptoms may be an emergency. Get help right away. Call 911. Do not wait to see if the symptoms will go away. Do not drive yourself to the hospital. Summary After the procedure, it is common to have a sore throat, hoarseness, nausea, vomiting, or to feel weak, sleepy, or fatigue. For the time period you were told by your health care provider, do not drive or use machinery. Get help right away if you have difficulty breathing, have chest pain, or vomit blood. These symptoms may be an emergency. This information is not intended to replace advice given to you by your health care provider. Make sure you discuss any questions you have with your health care provider. Document Revised: 06/24/2021 Document Reviewed: 06/24/2021 Elsevier Patient Education  2024 [employer].

## 2023-10-02 ENCOUNTER — Ambulatory Visit (HOSPITAL_BASED_OUTPATIENT_CLINIC_OR_DEPARTMENT_OTHER): Admitting: Anesthesiology

## 2023-10-02 ENCOUNTER — Ambulatory Visit (HOSPITAL_COMMUNITY)
Admission: RE | Admit: 2023-10-02 | Discharge: 2023-10-02 | Disposition: A | Discharge status: Home / Self Care | Attending: Gastroenterology | Admitting: Gastroenterology

## 2023-10-02 ENCOUNTER — Encounter (HOSPITAL_COMMUNITY)
Admission: RE | Disposition: A | Discharge status: Home / Self Care | Payer: Self-pay | Source: Home / Self Care | Attending: Gastroenterology

## 2023-10-02 ENCOUNTER — Encounter (HOSPITAL_COMMUNITY): Payer: Self-pay | Admitting: Gastroenterology

## 2023-10-02 ENCOUNTER — Ambulatory Visit (HOSPITAL_COMMUNITY): Admitting: Anesthesiology

## 2023-10-02 DIAGNOSIS — Z953 Presence of xenogenic heart valve: Secondary | ICD-10-CM | POA: Diagnosis not present

## 2023-10-02 DIAGNOSIS — I10 Essential (primary) hypertension: Secondary | ICD-10-CM

## 2023-10-02 DIAGNOSIS — E039 Hypothyroidism, unspecified: Secondary | ICD-10-CM | POA: Diagnosis not present

## 2023-10-02 DIAGNOSIS — K2970 Gastritis, unspecified, without bleeding: Secondary | ICD-10-CM | POA: Diagnosis not present

## 2023-10-02 DIAGNOSIS — K317 Polyp of stomach and duodenum: Secondary | ICD-10-CM | POA: Insufficient documentation

## 2023-10-02 DIAGNOSIS — I252 Old myocardial infarction: Secondary | ICD-10-CM | POA: Diagnosis not present

## 2023-10-02 DIAGNOSIS — R011 Cardiac murmur, unspecified: Secondary | ICD-10-CM | POA: Insufficient documentation

## 2023-10-02 DIAGNOSIS — I4819 Other persistent atrial fibrillation: Secondary | ICD-10-CM | POA: Insufficient documentation

## 2023-10-02 DIAGNOSIS — Z8711 Personal history of peptic ulcer disease: Secondary | ICD-10-CM | POA: Diagnosis not present

## 2023-10-02 DIAGNOSIS — K2950 Unspecified chronic gastritis without bleeding: Secondary | ICD-10-CM

## 2023-10-02 DIAGNOSIS — I2510 Atherosclerotic heart disease of native coronary artery without angina pectoris: Secondary | ICD-10-CM

## 2023-10-02 DIAGNOSIS — R12 Heartburn: Secondary | ICD-10-CM | POA: Insufficient documentation

## 2023-10-02 DIAGNOSIS — Z87891 Personal history of nicotine dependence: Secondary | ICD-10-CM | POA: Insufficient documentation

## 2023-10-02 DIAGNOSIS — Z7901 Long term (current) use of anticoagulants: Secondary | ICD-10-CM | POA: Diagnosis not present

## 2023-10-02 HISTORY — PX: ESOPHAGOGASTRODUODENOSCOPY: SHX5428

## 2023-10-02 SURGERY — EGD (ESOPHAGOGASTRODUODENOSCOPY)
Anesthesia: General

## 2023-10-02 MED ORDER — PROPOFOL 10 MG/ML IV BOLUS
INTRAVENOUS | Status: DC | PRN
Start: 2023-10-02 — End: 2023-10-02
  Administered 2023-10-02: 20 mg via INTRAVENOUS
  Administered 2023-10-02: 50 mg via INTRAVENOUS
  Administered 2023-10-02: 30 mg via INTRAVENOUS
  Administered 2023-10-02: 20 mg via INTRAVENOUS

## 2023-10-02 MED ORDER — EPHEDRINE SULFATE-NACL 50-0.9 MG/10ML-% IV SOSY
PREFILLED_SYRINGE | INTRAVENOUS | Status: DC | PRN
  Administered 2023-10-02 (×3): 5 mg via INTRAVENOUS

## 2023-10-02 MED ORDER — LACTATED RINGERS IV SOLN: INTRAVENOUS | Status: DC

## 2023-10-02 MED ORDER — LIDOCAINE 2% (20 MG/ML) 5 ML SYRINGE
INTRAMUSCULAR | Status: DC | PRN
  Administered 2023-10-02: 60 mg via INTRAVENOUS

## 2023-10-02 NOTE — Op Note (Signed)
 [facility] Patient Name: Hannah Roy Procedure Date: 10/02/2023 8:19 AM MRN: [id number] Date of Birth: 1940/09/23 Attending MD: Toribio Fortune , , [id number] CSN: [id number] Age: 83 Admit Type: Outpatient Procedure:                Upper GI endoscopy Indications:              Heartburn, burping Providers:                Toribio Fortune, Olam Ada, RN, Jon Loge Referring MD:              Medicines:                Monitored Anesthesia Care Complications:            No immediate complications. Estimated Blood Loss:     Estimated blood loss: none. Procedure:                Pre-Anesthesia Assessment:                           - Prior to the procedure, a History and Physical                            was performed, and patient medications, allergies                            and sensitivities were reviewed. The patient's                            tolerance of previous anesthesia was reviewed.                           - The risks and benefits of the procedure and the                            sedation options and risks were discussed with the                            patient. All questions were answered and informed                            consent was obtained.                           - ASA Grade Assessment: III - A patient with severe                            systemic disease.                           After obtaining informed consent, the endoscope was                            passed under direct vision. Throughout the                            procedure, the patient's blood pressure,  pulse, and                            oxygen  saturations were monitored continuously. The                            [id number]-H190 ([id number]) scope was introduced through the                            mouth, and advanced to the second part of duodenum.                            The upper GI endoscopy was accomplished without                            difficulty. The patient  tolerated the procedure                            well. Scope In: 8:35:19 AM Scope Out: 8:46:56 AM Total Procedure Duration: 0 hours 11 minutes 37 seconds  Findings:      The examined esophagus was normal.      Four 4 to 6 mm sessile polyps with no stigmata of recent bleeding were       found in the gastric body. The polyp was removed with a cold snare.       Resection and retrieval were complete. Biopsies from the stomach. were       taken with a cold forceps for Helicobacter pylori testing.      The examined duodenum was normal. Impression:               - Normal esophagus.                           - Four gastric polyps. Resected and retrieved.                            Normal stomach biopsied.                           - Normal examined duodenum. Moderate Sedation:      Per Anesthesia Care Recommendation:           - Discharge patient to home (ambulatory).                           - Resume previous diet.                           - Await pathology results.                           - Continue present medications.                           -Restart Eliquis  tomorrow. Procedure Code(s):        --- Professional ---  43251, Esophagogastroduodenoscopy, flexible,                            transoral; with removal of tumor(s), polyp(s), or                            other lesion(s) by snare technique                           43239, 59, Esophagogastroduodenoscopy, flexible,                            transoral; with biopsy, single or multiple Diagnosis Code(s):        --- Professional ---                           K31.7, Polyp of stomach and duodenum                           R12, Heartburn CPT copyright 2022 American Medical Association. All rights reserved. The codes documented in this report are preliminary and upon coder review may  be revised to meet current compliance requirements. Toribio Fortune, MD Toribio Fortune,  10/02/2023 9:01:02 AM This  report has been signed electronically. Number of Addenda: 0

## 2023-10-02 NOTE — Discharge Instructions (Addendum)
 You are being discharged to home.  Resume your previous diet.  We are waiting for your pathology results.  Continue your present medications. Restart Eliquis  tomorrow.
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
## 2023-10-02 NOTE — Transfer of Care (Signed)
 Immediate Anesthesia Transfer of Care Note  Patient: Hannah Roy  Procedure(s) Performed: EGD (ESOPHAGOGASTRODUODENOSCOPY)  Patient Location: Endoscopy Unit  Anesthesia Type:General  Level of Consciousness: awake, alert , oriented, and patient cooperative  Airway & Oxygen  Therapy: Patient Spontanous Breathing  Post-op Assessment: Report given to RN, Post -op Vital signs reviewed and stable, and Patient moving all extremities X 4  Post vital signs: Reviewed and stable  Last Vitals:  Vitals Value Taken Time  BP 118/64 10/02/23 08:52  Temp 36.4 C 10/02/23 08:52  Pulse 60 10/02/23 08:52  Resp 20 10/02/23 08:52  SpO2 99 % 10/02/23 08:52    Last Pain:  Vitals:   10/02/23 0852  TempSrc: Oral  PainSc: 0-No pain         Complications: No notable events documented.

## 2023-10-02 NOTE — Anesthesia Preprocedure Evaluation (Signed)
 Anesthesia Evaluation  Patient identified by MRN, date of birth, ID band Patient awake    Reviewed: Allergy & Precautions, H&P , NPO status , Patient's Chart, lab work & pertinent test results, reviewed documented beta blocker date and time   Airway Mallampati: II  TM Distance: >3 FB Neck ROM: full    Dental no notable dental hx.    Pulmonary neg pulmonary ROS, former smoker   Pulmonary exam normal breath sounds clear to auscultation       Cardiovascular Exercise Tolerance: Good hypertension, + CAD and + Past MI  + dysrhythmias + Valvular Problems/Murmurs (AS s/p TAVR)  Rhythm:regular Rate:Normal     Neuro/Psych negative neurological ROS  negative psych ROS   GI/Hepatic Neg liver ROS, PUD,,,  Endo/Other  Hypothyroidism    Renal/GU negative Renal ROS  negative genitourinary   Musculoskeletal   Abdominal   Peds  Hematology negative hematology ROS (+)   Anesthesia Other Findings   Reproductive/Obstetrics negative OB ROS                             Anesthesia Physical Anesthesia Plan  ASA: 3  Anesthesia Plan: General   Post-op Pain Management:    Induction:   PONV Risk Score and Plan: Propofol  infusion  Airway Management Planned:   Additional Equipment:   Intra-op Plan:   Post-operative Plan:   Informed Consent: I have reviewed the patients History and Physical, chart, labs and discussed the procedure including the risks, benefits and alternatives for the proposed anesthesia with the patient or authorized representative who has indicated his/her understanding and acceptance.     Dental Advisory Given  Plan Discussed with: CRNA  Anesthesia Plan Comments:        Anesthesia Quick Evaluation

## 2023-10-03 ENCOUNTER — Encounter (HOSPITAL_COMMUNITY): Payer: Self-pay | Admitting: Gastroenterology

## 2023-10-03 LAB — SURGICAL PATHOLOGY

## 2023-10-03 NOTE — Anesthesia Postprocedure Evaluation (Signed)
 Anesthesia Post Note  Patient: Hannah Roy  Procedure(s) Performed: EGD (ESOPHAGOGASTRODUODENOSCOPY)  Patient location during evaluation: Phase II Anesthesia Type: General Level of consciousness: awake Pain management: pain level controlled Vital Signs Assessment: post-procedure vital signs reviewed and stable Respiratory status: spontaneous breathing and respiratory function stable Cardiovascular status: blood pressure returned to baseline and stable Postop Assessment: no headache and no apparent nausea or vomiting Anesthetic complications: no Comments: Late entry   No notable events documented.   Last Vitals:  Vitals:   10/02/23 0800 10/02/23 0852  BP: 130/71 118/64  Pulse: (!) 50 60  Resp: 18 20  Temp: 36.9 C (!) 36.4 C  SpO2: 99% 99%    Last Pain:  Vitals:   10/02/23 0852  TempSrc: Oral  PainSc: 0-No pain                 Yvonna JINNY Bosworth

## 2023-10-05 ENCOUNTER — Ambulatory Visit (INDEPENDENT_AMBULATORY_CARE_PROVIDER_SITE_OTHER): Payer: Self-pay | Admitting: Gastroenterology

## 2023-10-08 ENCOUNTER — Other Ambulatory Visit (INDEPENDENT_AMBULATORY_CARE_PROVIDER_SITE_OTHER): Payer: Self-pay

## 2023-10-08 DIAGNOSIS — A048 Other specified bacterial intestinal infections: Secondary | ICD-10-CM

## 2023-10-08 DIAGNOSIS — R1013 Epigastric pain: Secondary | ICD-10-CM

## 2023-10-09 NOTE — Progress Notes (Signed)
 1 yr EGD noted in recall

## 2023-10-10 ENCOUNTER — Ambulatory Visit (HOSPITAL_COMMUNITY)
Admission: RE | Admit: 2023-10-10 | Discharge: 2023-10-10 | Disposition: A | Discharge status: Home / Self Care | Payer: Medicare Other | Source: Ambulatory Visit | Attending: Cardiology | Admitting: Cardiology

## 2023-10-10 ENCOUNTER — Ambulatory Visit: Payer: Self-pay | Admitting: Cardiology

## 2023-10-10 DIAGNOSIS — I38 Endocarditis, valve unspecified: Secondary | ICD-10-CM | POA: Insufficient documentation

## 2023-10-10 DIAGNOSIS — I359 Nonrheumatic aortic valve disorder, unspecified: Secondary | ICD-10-CM

## 2023-10-10 LAB — ECHOCARDIOGRAM COMPLETE
AR max vel: 0.97 cm2
AV Area VTI: 1.12 cm2
AV Area mean vel: 1 cm2
AV Mean grad: 11 mmHg
AV Peak grad: 22.9 mmHg
Ao pk vel: 2.4 m/s
Area-P 1/2: 3.85 cm2
MV VTI: 1.64 cm2
S' Lateral: 2.4 cm

## 2023-10-10 NOTE — Progress Notes (Signed)
*  PRELIMINARY RESULTS* Echocardiogram 2D Echocardiogram has been performed.  Hannah Roy 10/10/2023, 11:32 AM

## 2023-10-22 DIAGNOSIS — A048 Other specified bacterial intestinal infections: Secondary | ICD-10-CM | POA: Diagnosis not present

## 2023-10-22 DIAGNOSIS — R1013 Epigastric pain: Secondary | ICD-10-CM | POA: Diagnosis not present

## 2023-10-24 ENCOUNTER — Ambulatory Visit (INDEPENDENT_AMBULATORY_CARE_PROVIDER_SITE_OTHER): Payer: Self-pay | Admitting: Gastroenterology

## 2023-10-24 LAB — H. PYLORI BREATH TEST: H pylori Breath Test: NEGATIVE

## 2023-11-09 ENCOUNTER — Ambulatory Visit: Payer: Medicare Other

## 2023-11-22 DIAGNOSIS — E7849 Other hyperlipidemia: Secondary | ICD-10-CM | POA: Diagnosis not present

## 2023-11-23 ENCOUNTER — Ambulatory Visit: Payer: Self-pay | Admitting: Family Medicine

## 2023-11-23 LAB — LIPID PANEL
Chol/HDL Ratio: 2.2 ratio (ref 0.0–4.4)
Cholesterol, Total: 132 mg/dL (ref 100–199)
HDL: 59 mg/dL (ref >39)
LDL Chol Calc (NIH): 58 mg/dL (ref 0–99)
Triglycerides: 72 mg/dL (ref 0–149)
VLDL Cholesterol Cal: 15 mg/dL (ref 5–40)

## 2023-11-23 LAB — BASIC METABOLIC PANEL WITH GFR
BUN/Creatinine Ratio: 16 (ref 12–28)
BUN: 14 mg/dL (ref 8–27)
CO2: 22 mmol/L (ref 20–29)
Calcium: 9.2 mg/dL (ref 8.7–10.3)
Chloride: 102 mmol/L (ref 96–106)
Creatinine, Ser: 0.85 mg/dL (ref 0.57–1.00)
Glucose: 91 mg/dL (ref 70–99)
Potassium: 4.5 mmol/L (ref 3.5–5.2)
Sodium: 138 mmol/L (ref 134–144)
eGFR: 69 mL/min/1.73 (ref >59)

## 2023-11-26 ENCOUNTER — Encounter: Payer: Self-pay | Admitting: Cardiology

## 2023-11-26 ENCOUNTER — Ambulatory Visit: Attending: Cardiology | Admitting: Cardiology

## 2023-11-26 VITALS — BP 120/80 | HR 54 | Ht 61.0 in | Wt 111.8 lb

## 2023-11-26 DIAGNOSIS — I10 Essential (primary) hypertension: Secondary | ICD-10-CM | POA: Diagnosis not present

## 2023-11-26 DIAGNOSIS — Z953 Presence of xenogenic heart valve: Secondary | ICD-10-CM | POA: Diagnosis not present

## 2023-11-26 DIAGNOSIS — I480 Paroxysmal atrial fibrillation: Secondary | ICD-10-CM | POA: Diagnosis not present

## 2023-11-26 DIAGNOSIS — I2510 Atherosclerotic heart disease of native coronary artery without angina pectoris: Secondary | ICD-10-CM

## 2023-11-26 MED ORDER — APIXABAN 2.5 MG PO TABS: 2.5000 mg | ORAL_TABLET | Freq: Two times a day (BID) | ORAL | 0 refills | Status: DC

## 2023-11-26 NOTE — Addendum Note (Signed)
 Addended by: Les Longmore A on: 11/26/2023 09:47 AM   Modules accepted: Orders

## 2023-11-26 NOTE — Patient Instructions (Signed)
 Medication Instructions:  Your physician recommends that you continue on your current medications as directed. Please refer to the Current Medication list given to you today.   Labwork: None today  Testing/Procedures: None today  Follow-Up: 6 months  Any Other Special Instructions Will Be Listed Below (If Applicable).  If you need a refill on your cardiac medications before your next appointment, please call your pharmacy.

## 2023-11-26 NOTE — Progress Notes (Signed)
 Cardiology Office Note  Date: 11/26/2023   ID: Hannah Roy, DOB Nov 17, 1940, MRN [id number]  History of Present Illness: Hannah Roy is an 83 y.o. female last seen in January.  She is here for a routine visit.  Overall continues to do well, reports no exertional chest pain, NYHA class II dyspnea.  Remains functional with ADLs.  She did have an episode of persistent atrial fibrillation earlier in the year and was seen in the atrial fibrillation clinic in May.  I reviewed the note, she has a visit scheduled to establish with Dr. Nancey in September (previously saw Dr. Kelsie).  In talking with her today, it sounds like she has perhaps 2 breakthrough episodes of atrial fibrillation a year.  My sense would be to continue with present medications rather than just antiarrhythmics or pursue ablation.  We went over her medications.  She does not report any spontaneous bleeding problems on Eliquis .  No interval nitroglycerin  use.  Blood pressure is well-controlled today.  I went over her recent lab work which is noted below.  LDL 58.  Physical Exam: VS:  BP 120/80 (BP Location: Left Arm, Cuff Size: Normal)   Pulse (!) 54   Ht 5' 1 (1.549 m)   Wt 111 lb 12.8 oz (50.7 kg)   SpO2 97%   BMI 21.12 kg/m , BMI Body mass index is 21.12 kg/m.  Wt Readings from Last 3 Encounters:  11/26/23 111 lb 12.8 oz (50.7 kg)  08/31/23 109 lb 3.2 oz (49.5 kg)  08/22/23 107 lb 12.8 oz (48.9 kg)    General: Patient appears comfortable at rest. HEENT: Conjunctiva and lids normal. Neck: Supple, no elevated JVP or carotid bruits. Lungs: Clear to auscultation, nonlabored breathing at rest. Cardiac: Regular rate and rhythm, no S3, 3/6 systolic murmur. Extremities: No pitting edema.  ECG:  An ECG dated 08/31/2023 was personally reviewed today and demonstrated:  Sinus rhythm with prolonged PR interval, right bundle branch block, PAC.  Labwork: 12/28/2022: ALT 12; AST 16 01/11/2023: Hemoglobin 12.4; Platelets  157 08/31/2023: Magnesium  2.1 11/22/2023: BUN 14; Creatinine, Ser 0.85; Potassium 4.5; Sodium 138     Component Value Date/Time   CHOL 132 11/22/2023 0906   TRIG 72 11/22/2023 0906   HDL 59 11/22/2023 0906   CHOLHDL 2.2 11/22/2023 0906   CHOLHDL 2.8 05/16/2021 1036   VLDL 15 05/16/2021 1036   LDLCALC 58 11/22/2023 0906   Other Studies Reviewed Today:  Echocardiogram 10/10/2023:  1. Left ventricular ejection fraction, by estimation, is 65 to 70%. The  left ventricle has normal function. The left ventricle has no regional  wall motion abnormalities. There is mild concentric left ventricular  hypertrophy. Left ventricular diastolic  parameters are indeterminate.   2. Right ventricular systolic function is normal. The right ventricular  size is normal. There is normal pulmonary artery systolic pressure. The  estimated right ventricular systolic pressure is 25.8 mmHg.   3. Left atrial size was severely dilated.   4. Right atrial size was moderately dilated.   5. The mitral valve has been repaired/replaced. No evidence of mitral  valve regurgitation. The mean mitral valve gradient is 2.0 mmHg. There is  a 29 mm Magna Ease bioprosthetic valve present in the mitral position.   6. Edwards MC3 tricuspid annuloplasty ring present. The tricuspid valve  is has been repaired/replaced. Tricuspid valve regurgitation is mild to  moderate.   7. The aortic valve has been repaired/replaced. Aortic valve  regurgitation is trivial. There is  a 21 mm Magna bioprosthetic valve  present in the aortic position. Aortic valve mean gradient measures 11.0  mmHg.   8. Aortic dilatation noted. There is mild dilatation of the aortic root,  measuring 40 mm.   9. The inferior vena cava is normal in size with greater than 50%  respiratory variability, suggesting right atrial pressure of 3 mmHg.   Assessment and Plan:  1.  History of ascending aortic aneurysm with associated valvular heart disease status post 28 mm  Hemashield platinum supracoronary graft, 21 mm Edwards Magna Ease pericardial AVR, Maze procedure with left atrial appendage clipping, 29 mm Edwards Magna Mitral pericardial MVR, and 28 mm Edwards MC3 tricuspid annuloplasty in May 2020.  Recent follow-up echocardiogram outlined above showing relatively stable prosthetic function.  There is mild to moderate tricuspid regurgitation and also trivial aortic regurgitation.  She remains clinically stable, we discussed the results of her recent echocardiogram.   2.  Paroxysmal atrial fibrillation with CHA2DS2-VASc score of 5.  She is established with Dr. Mealor for EP follow-up (previously saw Dr. Kelsie).  Continue Eliquis  2.5 mg twice daily for stroke prophylaxis, also on Tikosyn  250 mcg twice daily and Lopressor  25 mg twice daily.   3.  Mild CAD by cardiac catheterization in October 2022.  LVEF 65-70 percent by recent follow-up echocardiogram.  No angina.  Continue Crestor  10 mg daily.  Recent LDL 58.   4.  Primary hypertension.  Blood pressure well-controlled today.  Continue Norvasc  7.5 mg daily.  Disposition:  Follow up 6 months.  Signed, Jayson JUDITHANN Sierras, M.D., F.A.C.C. [facility]

## 2023-12-06 ENCOUNTER — Other Ambulatory Visit (HOSPITAL_COMMUNITY): Payer: Self-pay | Admitting: *Deleted

## 2023-12-06 MED ORDER — DOFETILIDE 250 MCG PO CAPS: 250.0000 ug | ORAL_CAPSULE | Freq: Two times a day (BID) | ORAL | 2 refills | Status: AC

## 2023-12-12 ENCOUNTER — Ambulatory Visit: Admitting: Family Medicine

## 2023-12-12 ENCOUNTER — Encounter (INDEPENDENT_AMBULATORY_CARE_PROVIDER_SITE_OTHER): Payer: Self-pay | Admitting: Gastroenterology

## 2023-12-12 VITALS — BP 110/69 | HR 60 | Temp 97.9°F | Ht 61.0 in | Wt 111.0 lb

## 2023-12-12 DIAGNOSIS — E7849 Other hyperlipidemia: Secondary | ICD-10-CM

## 2023-12-12 DIAGNOSIS — I10 Essential (primary) hypertension: Secondary | ICD-10-CM

## 2023-12-12 DIAGNOSIS — Z23 Encounter for immunization: Secondary | ICD-10-CM

## 2023-12-12 DIAGNOSIS — D6869 Other thrombophilia: Secondary | ICD-10-CM

## 2023-12-12 DIAGNOSIS — H9193 Unspecified hearing loss, bilateral: Secondary | ICD-10-CM | POA: Diagnosis not present

## 2023-12-12 DIAGNOSIS — I480 Paroxysmal atrial fibrillation: Secondary | ICD-10-CM

## 2023-12-12 NOTE — Progress Notes (Signed)
 Subjective:    Patient ID: Hannah Roy, female    DOB: 10-17-1940, 83 y.o.   MRN: [id number]  HPI Postconcussion syndrome Hyperlipidemia GERD Hypertension  Discussed the use of AI scribe software for clinical note transcription with the patient, who gave verbal consent to proceed. Results for orders placed or performed during the hospital encounter of 10/10/23  ECHOCARDIOGRAM COMPLETE   Collection Time: 10/10/23 11:32 AM  Result Value Ref Range   AR max vel 0.97 cm2   AV Area VTI 1.12 cm2   AV Mean grad 11.0 mmHg   AV Peak grad 22.9 mmHg   Ao pk vel 2.40 m/s   AV Area mean vel 1.00 cm2   MV VTI 1.64 cm2   Area-P 1/2 3.85 cm2   S' Lateral 2.40 cm   Est EF 65 - 70%    Lab work from 11/22/2023 was reviewed in detail cholesterol profile looks good kidney functions look good History of Present Illness   Hannah Roy is an 83 year old female with atrial fibrillation who presents for a routine follow-up visit.  She is on Eliquis  2.5 mg. She denies recent episodes of rapid heart rate and remains vigilant about her condition.  In June, she underwent an endoscopy which revealed small gastric polyps without dysplasia or malignancy. She tested negative for H. pylori and is scheduled for follow-up monitoring of the polyps.  She experiences itchy ears and uses Q-tips for relief. She acknowledges hearing difficulties but has not yet obtained a hearing aid.  She maintains a careful diet, avoiding hot dogs, and occasionally eating hamburgers and salads. She frequently visits the hospital cafeteria due to her husband's medical needs. She experiences occasional tiredness and 'heavy swimming' sensations and tries to maintain a consistent sleep schedule, usually lying down after her last medication at 7 PM.  No recent falls, and she reports drinking a lot of water . No swelling in her legs. She had an ultrasound of her heart this summer. Her blood work was performed, and her kidney function is checked  yearly.      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 Heart function and mechanical valve are stable. Blood pressure and heart rate are within target range. - Continue Eliquis  2.5 mg. - Monitor for bleeding signs and report symptoms. - Perform blood work before next visit.  Gastric polyps under surveillance Gastric polyps identified with no dysplasia or malignancy. H. pylori negative. Continued surveillance required. - Schedule follow-up for gastric polyp surveillance. - Monitor and report gastrointestinal symptoms.  Hearing loss Hearing loss present. Hearing aids recommended but cost is a barrier. Affordable options available at [employer] and [employer]. - Consider [employer] or [employer] for hearing test and affordable hearing aids. - Offer referral to ear specialist in [place] if needed.  Pruritus of bilateral external ear canal Pruritus likely due to wax accumulation. Q-tips discouraged. - Use wet washcloth for ear cleaning. - Monitor for symptom changes.  General Health Maintenance General health maintenance ongoing. Cholesterol levels good. Kidney function monitored annually. Dietary modifications advised. - Administer flu shot. - Continue dietary modifications to avoid stomach issues.  1. Immunization due (  Primary) Today - Flu vaccine HIGH DOSE PF(Fluzone Trivalent)  2. Essential hypertension Blood pressure good control  3. Hypercoagulable state due to paroxysmal atrial fibrillation (HCC) On Eliquis  tolerating well no bleeding issues lower dose per protocol  4. Essential hypertension, benign See above  5.  Other hyperlipidemia Cholesterol good control  6. Bilateral hearing loss, unspecified hearing loss type Patient will try [employer] or [employer] but if she needs referral to ENT she will let us  know  Follow-up 6 months

## 2023-12-17 ENCOUNTER — Other Ambulatory Visit: Payer: Self-pay

## 2023-12-17 DIAGNOSIS — Z79899 Other long term (current) drug therapy: Secondary | ICD-10-CM

## 2023-12-17 DIAGNOSIS — E7849 Other hyperlipidemia: Secondary | ICD-10-CM

## 2023-12-17 DIAGNOSIS — I10 Essential (primary) hypertension: Secondary | ICD-10-CM

## 2024-01-02 ENCOUNTER — Ambulatory Visit: Attending: Cardiology | Admitting: Cardiovascular Disease

## 2024-01-02 VITALS — BP 118/66 | HR 62 | Ht 61.0 in | Wt 112.3 lb

## 2024-01-02 DIAGNOSIS — Z5181 Encounter for therapeutic drug level monitoring: Secondary | ICD-10-CM

## 2024-01-02 DIAGNOSIS — Z79899 Other long term (current) drug therapy: Secondary | ICD-10-CM

## 2024-01-02 DIAGNOSIS — I480 Paroxysmal atrial fibrillation: Secondary | ICD-10-CM | POA: Diagnosis not present

## 2024-01-02 NOTE — Patient Instructions (Signed)
 Medication Instructions:  Your physician recommends that you continue on your current medications as directed. Please refer to the Current Medication list given to you today.  *If you need a refill on your cardiac medications before your next appointment, please call your pharmacy*  Lab Work: None ordered  If you have labs (blood work) drawn today and your tests are completely normal, you will receive your results only by: MyChart Message (if you have MyChart) OR A paper copy in the mail If you have any lab test that is abnormal or we need to change your treatment, we will call you to review the results.  Testing/Procedures: None ordered.   Follow-Up: At [facility], you and your health needs are our priority.  As part of our continuing mission to provide you with exceptional heart care, our providers are all part of one team.  This team includes your primary Cardiologist (physician) and Advanced Practice Providers or APPs (Physician Assistants and Nurse Practitioners) who all work together to provide you with the care you need, when you need it.  Your next appointment:   6 months with [facility]

## 2024-01-02 NOTE — Progress Notes (Signed)
 Electrophysiology Office Note:    Date:  01/02/2024   ID:  Hannah Roy, DOB 03-Jun-1940, MRN [id number]  PCP:  Alphonsa Glendia LABOR, MD   [facility] Providers Cardiologist:  Jayson Sierras, MD     Referring MD: Alphonsa Glendia LABOR, MD   History of Present Illness:    Hannah Roy is a 83 y.o. female with a medical history significant for persistent atrial fibrillation, severe cardiac valve disease with replacement of the mitral and aortic valves and repair of the tricuspid valve, referred for arrhythmia management.      Discussed the use of AI scribe software for clinical note transcription with the patient, who gave verbal consent to proceed.  History of Present Illness Hannah Roy is an 83 year old female with mitral and aortic valve replacement, tricuspid ring repair, and atrial fibrillation who presents for follow-up of her arrhythmia management.  She experiences persistent atrial fibrillation and atrial flutter despite mitral and aortic valve replacement, tricuspid ring repair, and a maze procedure. Atrial fibrillation was initially diagnosed in the ER on May 10, 2018, and atypical atrial flutter was identified in 2021. She has undergone cardioversion previously.  She takes Tikosyn  consistently and has not experienced an episode in the past three months. Previously, she had two episodes in close succession, with the last one being particularly debilitating. She monitors her diet and avoids fried foods. Her episodes have been infrequent, occurring twice a year.         Today, she reports that she feels well, and she has no complaints.  EKGs/Labs/Other Studies Reviewed Today:     Echocardiogram:  TTE October 10, 2023 LVEF 65 to 70%.  Mild concentric LVH.  Left atrium severely dilated.  Right atrium moderately dilated.  Bioprosthetic mitral valve in place, tricuspid annuloplasty ring present, bioprosthetic aortic valve present   Cardiac catherization  Coronary angiogram  February 07, 2021 Reviewed in epic --mild nonobstructive CAD  EKG:   EKG Interpretation Date/Time:  Wednesday January 02 2024 11:35:28 EDT Ventricular Rate:  60 PR Interval:  220 QRS Duration:  142 QT Interval:  502 QTC Calculation: 502 R Axis:   68  Text Interpretation: Sinus rhythm with 1st degree A-V block Right bundle branch block When compared with ECG of 31-Aug-2023 13:15, No significant change was found Confirmed by Nancey Scotts [phone]) on 01/02/2024 11:43:30 AM     Physical Exam:    VS:  BP 118/66 (BP Location: Right Arm, Patient Position: Sitting, Cuff Size: Normal)   Pulse 62   Ht 5' 1 (1.549 m)   Wt 112 lb 4.8 oz (50.9 kg)   SpO2 98%   BMI 21.22 kg/m     Wt Readings from Last 3 Encounters:  01/02/24 112 lb 4.8 oz (50.9 kg)  12/12/23 111 lb (50.3 kg)  11/26/23 111 lb 12.8 oz (50.7 kg)     GEN: Well nourished, well developed in no acute distress CARDIAC: RRR, no murmurs, rubs, gallops RESPIRATORY:  Normal work of breathing MUSCULOSKELETAL: no edema    ASSESSMENT & PLAN:     Persistent atrial fibrillation, atrial flutter Has had cardioversion in the past Likely poor ablation candidate due to severely enlarged left atrium, history of maze procedure, mitral valve replacement Will continue dofetilide  250 mg for now If she has progression of atrial fibrillation, I would likely try amiodarone  next  Valvular heart disease S/p mitral valve replacement, bioprosthetic Status post aortic valve replacement, bioprosthetic Status post tricuspid valve repair  Secondary hypercoagulable  state Mitral valve appears to have been replaced due to regurgitation, not stenosis Continue apixaban  2.5 mg (age 7, BMI < 60)  High risk medication QTc today, corrected for QRS duration, acceptable to continue Tikosyn     Signed, Eulas FORBES Furbish, MD  01/02/2024 8:10 PM    [facility]

## 2024-01-15 ENCOUNTER — Other Ambulatory Visit (HOSPITAL_COMMUNITY): Payer: Self-pay | Admitting: Family Medicine

## 2024-01-15 DIAGNOSIS — Z1231 Encounter for screening mammogram for malignant neoplasm of breast: Secondary | ICD-10-CM

## 2024-01-18 ENCOUNTER — Ambulatory Visit (HOSPITAL_COMMUNITY)
Admission: RE | Admit: 2024-01-18 | Discharge: 2024-01-18 | Disposition: A | Discharge status: Home / Self Care | Source: Ambulatory Visit | Attending: Family Medicine | Admitting: Family Medicine

## 2024-01-18 DIAGNOSIS — Z1231 Encounter for screening mammogram for malignant neoplasm of breast: Secondary | ICD-10-CM | POA: Diagnosis not present

## 2024-01-23 ENCOUNTER — Encounter (INDEPENDENT_AMBULATORY_CARE_PROVIDER_SITE_OTHER): Payer: Self-pay | Admitting: Gastroenterology

## 2024-01-28 ENCOUNTER — Ambulatory Visit (INDEPENDENT_AMBULATORY_CARE_PROVIDER_SITE_OTHER): Admitting: Gastroenterology

## 2024-01-28 ENCOUNTER — Encounter (INDEPENDENT_AMBULATORY_CARE_PROVIDER_SITE_OTHER): Payer: Self-pay | Admitting: Gastroenterology

## 2024-01-28 VITALS — BP 121/75 | HR 67 | Temp 97.5°F | Ht 61.0 in | Wt 113.7 lb

## 2024-01-28 DIAGNOSIS — R142 Eructation: Secondary | ICD-10-CM

## 2024-01-28 DIAGNOSIS — K317 Polyp of stomach and duodenum: Secondary | ICD-10-CM | POA: Insufficient documentation

## 2024-01-28 DIAGNOSIS — K219 Gastro-esophageal reflux disease without esophagitis: Secondary | ICD-10-CM | POA: Diagnosis not present

## 2024-01-28 NOTE — Patient Instructions (Addendum)
 Continue pantoprazole  40 mg twice a day Take simethicone  (Gas-X) as needed for burping Repeat EGD in June 2026
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
## 2024-02-01 ENCOUNTER — Ambulatory Visit

## 2024-02-01 VITALS — Ht 61.0 in | Wt 113.0 lb

## 2024-02-01 DIAGNOSIS — Z0000 Encounter for general adult medical examination without abnormal findings: Secondary | ICD-10-CM | POA: Diagnosis not present

## 2024-02-01 NOTE — Patient Instructions (Signed)
 Hannah Roy,  Thank you for taking the time for your Medicare Wellness Visit. I appreciate your continued commitment to your health goals. Please review the care plan we discussed, and feel free to reach out if I can assist you further.  Medicare recommends these wellness visits once per year to help you and your care team stay ahead of potential health issues. These visits are designed to focus on prevention, allowing your provider to concentrate on managing your acute and chronic conditions during your regular appointments.  Please note that Annual Wellness Visits do not include a physical exam. Some assessments may be limited, especially if the visit was conducted virtually. If needed, we may recommend a separate in-person follow-up with your provider.  Ongoing Care Seeing your primary care provider every 3 to 6 months helps us  monitor your health and provide consistent, personalized care.   Referrals If a referral was made during today's visit and you haven't received any updates within two weeks, please contact the referred provider directly to check on the status.  Recommended Screenings:  Health Maintenance  Topic Date Due   DTaP/Tdap/Td vaccine (1 - Tdap) Never done   COVID-19 Vaccine (4 - 2025-26 season) 12/10/2023   Medicare Annual Wellness Visit  01/31/2025   Colon Cancer Screening  06/15/2027   Pneumococcal Vaccine for age over 40  Completed   Flu Shot  Completed   DEXA scan (bone density measurement)  Completed   Zoster (Shingles) Vaccine  Completed   Meningitis B Vaccine  Aged Out   Hepatitis C Screening  Discontinued       02/01/2024    1:47 PM  Advanced Directives  Does Patient Have a Medical Advance Directive? No  Would patient like information on creating a medical advance directive? Yes (MAU/Ambulatory/Procedural Areas - Information given)   Advance Care Planning is important because it: Ensures you receive medical care that aligns with your values, goals, and  preferences. Provides guidance to your family and loved ones, reducing the emotional burden of decision-making during critical moments.  Information on Advanced Care Planning can be found at [place]  Secretary of [facility] Advance Health Care Directives Advance Health Care Directives ([email or web address]   Vision: Annual vision screenings are recommended for early detection of glaucoma, cataracts, and diabetic retinopathy. These exams can also reveal signs of chronic conditions such as diabetes and high blood pressure.  Dental: Annual dental screenings help detect early signs of oral cancer, gum disease, and other conditions linked to overall health, including heart disease and diabetes.  Please see the attached documents for additional preventive care recommendations.
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
## 2024-02-22 ENCOUNTER — Other Ambulatory Visit (HOSPITAL_COMMUNITY): Payer: Self-pay | Admitting: Physician Assistant

## 2024-03-15 ENCOUNTER — Other Ambulatory Visit (INDEPENDENT_AMBULATORY_CARE_PROVIDER_SITE_OTHER): Payer: Self-pay | Admitting: Gastroenterology

## 2024-03-15 ENCOUNTER — Other Ambulatory Visit: Payer: Self-pay | Admitting: Family Medicine

## 2024-03-15 ENCOUNTER — Other Ambulatory Visit: Payer: Self-pay | Admitting: Cardiology

## 2024-03-15 DIAGNOSIS — R1013 Epigastric pain: Secondary | ICD-10-CM

## 2024-05-28 ENCOUNTER — Ambulatory Visit: Admitting: Student

## 2024-05-29 ENCOUNTER — Ambulatory Visit: Admitting: Student

## 2024-06-11 ENCOUNTER — Ambulatory Visit: Admitting: Family Medicine

## 2024-07-01 ENCOUNTER — Ambulatory Visit (HOSPITAL_COMMUNITY): Admitting: Physician Assistant

## 2024-07-15 ENCOUNTER — Ambulatory Visit: Admitting: Cardiology
